# Patient Record
Sex: Female | Born: 1945 | Race: Black or African American | Hispanic: No | Smoking: Former smoker
Health system: Southern US, Community
[De-identification: ages and names within clinical notes are randomized; demographics above are authoritative.]

## PROBLEM LIST (undated history)

## (undated) DIAGNOSIS — M8580 Other specified disorders of bone density and structure, unspecified site: Secondary | ICD-10-CM

## (undated) DIAGNOSIS — R51 Headache: Secondary | ICD-10-CM

## (undated) DIAGNOSIS — R519 Headache, unspecified: Secondary | ICD-10-CM

## (undated) DIAGNOSIS — E785 Hyperlipidemia, unspecified: Secondary | ICD-10-CM

## (undated) DIAGNOSIS — M5090 Cervical disc disorder, unspecified, unspecified cervical region: Secondary | ICD-10-CM

## (undated) DIAGNOSIS — I10 Essential (primary) hypertension: Secondary | ICD-10-CM

## (undated) DIAGNOSIS — N186 End stage renal disease: Secondary | ICD-10-CM

## (undated) DIAGNOSIS — K449 Diaphragmatic hernia without obstruction or gangrene: Secondary | ICD-10-CM

## (undated) DIAGNOSIS — Z992 Dependence on renal dialysis: Secondary | ICD-10-CM

## (undated) DIAGNOSIS — J329 Chronic sinusitis, unspecified: Secondary | ICD-10-CM

## (undated) DIAGNOSIS — F419 Anxiety disorder, unspecified: Secondary | ICD-10-CM

## (undated) DIAGNOSIS — M1250 Traumatic arthropathy, unspecified site: Secondary | ICD-10-CM

## (undated) DIAGNOSIS — N289 Disorder of kidney and ureter, unspecified: Secondary | ICD-10-CM

## (undated) DIAGNOSIS — N179 Acute kidney failure, unspecified: Secondary | ICD-10-CM

## (undated) DIAGNOSIS — J189 Pneumonia, unspecified organism: Secondary | ICD-10-CM

## (undated) DIAGNOSIS — K219 Gastro-esophageal reflux disease without esophagitis: Secondary | ICD-10-CM

## (undated) DIAGNOSIS — K222 Esophageal obstruction: Secondary | ICD-10-CM

## (undated) DIAGNOSIS — K589 Irritable bowel syndrome without diarrhea: Secondary | ICD-10-CM

## (undated) DIAGNOSIS — J309 Allergic rhinitis, unspecified: Secondary | ICD-10-CM

## (undated) DIAGNOSIS — K5790 Diverticulosis of intestine, part unspecified, without perforation or abscess without bleeding: Secondary | ICD-10-CM

## (undated) DIAGNOSIS — Q396 Congenital diverticulum of esophagus: Secondary | ICD-10-CM

## (undated) DIAGNOSIS — M1990 Unspecified osteoarthritis, unspecified site: Secondary | ICD-10-CM

## (undated) DIAGNOSIS — R918 Other nonspecific abnormal finding of lung field: Secondary | ICD-10-CM

## (undated) DIAGNOSIS — Z9289 Personal history of other medical treatment: Secondary | ICD-10-CM

## (undated) DIAGNOSIS — R079 Chest pain, unspecified: Secondary | ICD-10-CM

## (undated) DIAGNOSIS — J449 Chronic obstructive pulmonary disease, unspecified: Secondary | ICD-10-CM

## (undated) HISTORY — PX: CYSTECTOMY: SUR359

## (undated) HISTORY — PX: BACK SURGERY: SHX140

## (undated) HISTORY — DX: Allergic rhinitis, unspecified: J30.9

## (undated) HISTORY — DX: Unspecified osteoarthritis, unspecified site: M19.90

## (undated) HISTORY — PX: TONSILLECTOMY AND ADENOIDECTOMY: SUR1326

## (undated) HISTORY — DX: Gastro-esophageal reflux disease without esophagitis: K21.9

## (undated) HISTORY — DX: Irritable bowel syndrome, unspecified: K58.9

## (undated) HISTORY — DX: Traumatic arthropathy, unspecified site: M12.50

## (undated) HISTORY — DX: Anxiety disorder, unspecified: F41.9

## (undated) HISTORY — DX: Chronic obstructive pulmonary disease, unspecified: J44.9

## (undated) HISTORY — DX: Diaphragmatic hernia without obstruction or gangrene: K44.9

## (undated) HISTORY — PX: VAGINAL HYSTERECTOMY: SUR661

## (undated) HISTORY — DX: End stage renal disease: N18.6

## (undated) HISTORY — DX: Other specified disorders of bone density and structure, unspecified site: M85.80

## (undated) HISTORY — DX: Esophageal obstruction: K22.2

## (undated) HISTORY — PX: LUNG BIOPSY: SHX232

## (undated) HISTORY — DX: Congenital diverticulum of esophagus: Q39.6

## (undated) HISTORY — DX: Essential (primary) hypertension: I10

## (undated) HISTORY — DX: Diverticulosis of intestine, part unspecified, without perforation or abscess without bleeding: K57.90

---

## 1998-01-05 ENCOUNTER — Ambulatory Visit (HOSPITAL_COMMUNITY): Admission: RE | Admit: 1998-01-05 | Discharge: 1998-01-05 | Payer: Self-pay | Admitting: Gynecology

## 1998-01-07 ENCOUNTER — Ambulatory Visit (HOSPITAL_COMMUNITY): Admission: RE | Admit: 1998-01-07 | Discharge: 1998-01-07 | Payer: Self-pay | Admitting: Gynecology

## 1998-07-14 ENCOUNTER — Ambulatory Visit (HOSPITAL_COMMUNITY): Admission: RE | Admit: 1998-07-14 | Discharge: 1998-07-14 | Payer: Self-pay | Admitting: Internal Medicine

## 1998-07-14 ENCOUNTER — Encounter: Payer: Self-pay | Admitting: Internal Medicine

## 1999-02-22 ENCOUNTER — Encounter: Payer: Self-pay | Admitting: Gynecology

## 1999-02-22 ENCOUNTER — Ambulatory Visit (HOSPITAL_COMMUNITY): Admission: RE | Admit: 1999-02-22 | Discharge: 1999-02-22 | Payer: Self-pay | Admitting: Gynecology

## 1999-05-10 HISTORY — PX: COLONOSCOPY: SHX174

## 2000-02-15 ENCOUNTER — Encounter: Payer: Self-pay | Admitting: Internal Medicine

## 2000-02-15 ENCOUNTER — Ambulatory Visit (HOSPITAL_COMMUNITY): Admission: RE | Admit: 2000-02-15 | Discharge: 2000-02-15 | Payer: Self-pay | Admitting: Internal Medicine

## 2001-04-16 ENCOUNTER — Other Ambulatory Visit: Admission: RE | Admit: 2001-04-16 | Discharge: 2001-04-16 | Payer: Self-pay | Admitting: Obstetrics and Gynecology

## 2001-07-20 ENCOUNTER — Ambulatory Visit (HOSPITAL_COMMUNITY): Admission: RE | Admit: 2001-07-20 | Discharge: 2001-07-20 | Payer: Self-pay | Admitting: Otolaryngology

## 2001-07-20 ENCOUNTER — Encounter: Payer: Self-pay | Admitting: Otolaryngology

## 2002-10-26 ENCOUNTER — Encounter: Payer: Self-pay | Admitting: Emergency Medicine

## 2002-10-27 ENCOUNTER — Inpatient Hospital Stay (HOSPITAL_COMMUNITY): Admission: EM | Admit: 2002-10-27 | Discharge: 2002-10-29 | Payer: Self-pay | Admitting: Emergency Medicine

## 2003-05-10 HISTORY — PX: ANTERIOR CERVICAL DECOMP/DISCECTOMY FUSION: SHX1161

## 2003-05-26 ENCOUNTER — Inpatient Hospital Stay (HOSPITAL_COMMUNITY): Admission: RE | Admit: 2003-05-26 | Discharge: 2003-05-28 | Payer: Self-pay | Admitting: Specialist

## 2003-12-17 ENCOUNTER — Other Ambulatory Visit: Admission: RE | Admit: 2003-12-17 | Discharge: 2003-12-17 | Payer: Self-pay | Admitting: Gynecology

## 2004-07-15 ENCOUNTER — Ambulatory Visit: Payer: Self-pay | Admitting: Internal Medicine

## 2004-08-02 ENCOUNTER — Ambulatory Visit: Payer: Self-pay | Admitting: Internal Medicine

## 2005-06-01 ENCOUNTER — Ambulatory Visit: Payer: Self-pay | Admitting: Internal Medicine

## 2005-07-12 ENCOUNTER — Ambulatory Visit: Payer: Self-pay | Admitting: Internal Medicine

## 2005-07-14 ENCOUNTER — Ambulatory Visit (HOSPITAL_COMMUNITY): Admission: RE | Admit: 2005-07-14 | Discharge: 2005-07-14 | Payer: Self-pay | Admitting: Sports Medicine

## 2006-02-13 ENCOUNTER — Ambulatory Visit: Payer: Self-pay | Admitting: Internal Medicine

## 2006-10-19 ENCOUNTER — Ambulatory Visit: Payer: Self-pay | Admitting: Internal Medicine

## 2006-10-24 LAB — CONVERTED CEMR LAB
BUN: 19 mg/dL (ref 6–23)
Basophils Absolute: 0 10*3/uL (ref 0.0–0.1)
Basophils Relative: 0.8 % (ref 0.0–1.0)
CO2: 31 meq/L (ref 19–32)
Calcium: 8.9 mg/dL (ref 8.4–10.5)
Chloride: 106 meq/L (ref 96–112)
Creatinine, Ser: 1 mg/dL (ref 0.4–1.2)
Hemoglobin: 14.1 g/dL (ref 12.0–15.0)
Monocytes Absolute: 0.5 10*3/uL (ref 0.2–0.7)
Monocytes Relative: 9.3 % (ref 3.0–11.0)
Potassium: 3.5 meq/L (ref 3.5–5.1)
RBC: 4.39 M/uL (ref 3.87–5.11)
RDW: 12 % (ref 11.5–14.6)

## 2006-11-02 ENCOUNTER — Ambulatory Visit: Payer: Self-pay | Admitting: Internal Medicine

## 2006-11-06 ENCOUNTER — Ambulatory Visit: Payer: Self-pay

## 2006-11-06 ENCOUNTER — Encounter: Payer: Self-pay | Admitting: Internal Medicine

## 2006-11-07 ENCOUNTER — Ambulatory Visit: Payer: Self-pay | Admitting: Internal Medicine

## 2007-07-30 ENCOUNTER — Telehealth: Payer: Self-pay | Admitting: Internal Medicine

## 2007-12-25 ENCOUNTER — Ambulatory Visit: Payer: Self-pay | Admitting: Internal Medicine

## 2007-12-25 DIAGNOSIS — I10 Essential (primary) hypertension: Secondary | ICD-10-CM | POA: Insufficient documentation

## 2008-01-08 LAB — CONVERTED CEMR LAB: Pap Smear: NORMAL

## 2008-01-31 ENCOUNTER — Emergency Department (HOSPITAL_COMMUNITY): Admission: EM | Admit: 2008-01-31 | Discharge: 2008-01-31 | Payer: Self-pay | Admitting: Emergency Medicine

## 2008-02-13 ENCOUNTER — Emergency Department (HOSPITAL_COMMUNITY): Admission: EM | Admit: 2008-02-13 | Discharge: 2008-02-14 | Payer: Self-pay | Admitting: Emergency Medicine

## 2008-03-26 ENCOUNTER — Ambulatory Visit (HOSPITAL_COMMUNITY): Admission: RE | Admit: 2008-03-26 | Discharge: 2008-03-26 | Payer: Self-pay | Admitting: Gynecology

## 2008-04-16 ENCOUNTER — Emergency Department (HOSPITAL_COMMUNITY): Admission: EM | Admit: 2008-04-16 | Discharge: 2008-04-16 | Payer: Self-pay | Admitting: Emergency Medicine

## 2008-04-25 ENCOUNTER — Telehealth (INDEPENDENT_AMBULATORY_CARE_PROVIDER_SITE_OTHER): Payer: Self-pay | Admitting: *Deleted

## 2008-08-21 ENCOUNTER — Encounter: Payer: Self-pay | Admitting: Internal Medicine

## 2008-08-21 ENCOUNTER — Ambulatory Visit: Payer: Self-pay | Admitting: Internal Medicine

## 2008-08-21 DIAGNOSIS — R930 Abnormal findings on diagnostic imaging of skull and head, not elsewhere classified: Secondary | ICD-10-CM | POA: Insufficient documentation

## 2008-08-21 DIAGNOSIS — R198 Other specified symptoms and signs involving the digestive system and abdomen: Secondary | ICD-10-CM | POA: Insufficient documentation

## 2008-08-22 ENCOUNTER — Ambulatory Visit: Payer: Self-pay | Admitting: Internal Medicine

## 2008-08-22 LAB — CONVERTED CEMR LAB
ALT: 11 units/L (ref 0–35)
AST: 14 units/L (ref 0–37)
BUN: 8 mg/dL (ref 6–23)
Basophils Absolute: 0 10*3/uL (ref 0.0–0.1)
Bilirubin Urine: NEGATIVE
Bilirubin, Direct: 0.1 mg/dL (ref 0.0–0.3)
Cholesterol: 191 mg/dL (ref 0–200)
Creatinine, Ser: 0.7 mg/dL (ref 0.4–1.2)
Creatinine,U: 466.2 mg/dL
Eosinophils Relative: 3.2 % (ref 0.0–5.0)
Folate: 3.2 ng/mL
GFR calc non Af Amer: 108.02 mL/min (ref >60)
Glucose, Bld: 103 mg/dL — ABNORMAL HIGH (ref 70–99)
H Pylori IgG: NEGATIVE
HCT: 34.9 % — ABNORMAL LOW (ref 36.0–46.0)
Hemoglobin, Urine: NEGATIVE
Hgb A1c MFr Bld: 6.1 % (ref 4.6–6.5)
Iron: 32 ug/dL — ABNORMAL LOW (ref 42–145)
LDL Cholesterol: 112 mg/dL — ABNORMAL HIGH (ref 0–99)
Lymphs Abs: 1 10*3/uL (ref 0.7–4.0)
Microalb Creat Ratio: 5.8 mg/g (ref 0.0–30.0)
Microalb, Ur: 2.7 mg/dL — ABNORMAL HIGH (ref 0.0–1.9)
Monocytes Absolute: 0.4 10*3/uL (ref 0.1–1.0)
Monocytes Relative: 5.9 % (ref 3.0–12.0)
Neutrophils Relative %: 76.4 % (ref 43.0–77.0)
Platelets: 341 10*3/uL (ref 150.0–400.0)
RDW: 11.8 % (ref 11.5–14.6)
Saturation Ratios: 11.6 % — ABNORMAL LOW (ref 20.0–50.0)
Total Bilirubin: 0.5 mg/dL (ref 0.3–1.2)
Total CHOL/HDL Ratio: 3
Total Protein, Urine: 30 mg/dL
Transferrin: 196.2 mg/dL — ABNORMAL LOW (ref 212.0–360.0)
Triglycerides: 107 mg/dL (ref 0.0–149.0)
Urine Glucose: NEGATIVE mg/dL
Urobilinogen, UA: 1 (ref 0.0–1.0)
VLDL: 21.4 mg/dL (ref 0.0–40.0)
Vit D, 25-Hydroxy: 7 ng/mL — ABNORMAL LOW (ref 30–89)
Vitamin B-12: 318 pg/mL (ref 211–911)
WBC: 6.6 10*3/uL (ref 4.5–10.5)

## 2008-08-25 ENCOUNTER — Ambulatory Visit: Payer: Self-pay | Admitting: Internal Medicine

## 2008-08-26 ENCOUNTER — Ambulatory Visit: Payer: Self-pay | Admitting: Internal Medicine

## 2008-08-26 ENCOUNTER — Telehealth (INDEPENDENT_AMBULATORY_CARE_PROVIDER_SITE_OTHER): Payer: Self-pay | Admitting: *Deleted

## 2008-08-27 ENCOUNTER — Encounter: Payer: Self-pay | Admitting: Internal Medicine

## 2008-08-29 ENCOUNTER — Ambulatory Visit: Payer: Self-pay | Admitting: Internal Medicine

## 2008-08-29 DIAGNOSIS — R059 Cough, unspecified: Secondary | ICD-10-CM | POA: Insufficient documentation

## 2008-08-29 DIAGNOSIS — R05 Cough: Secondary | ICD-10-CM

## 2008-09-03 ENCOUNTER — Ambulatory Visit (HOSPITAL_COMMUNITY): Admission: RE | Admit: 2008-09-03 | Discharge: 2008-09-03 | Payer: Self-pay | Admitting: Internal Medicine

## 2008-09-04 ENCOUNTER — Ambulatory Visit: Payer: Self-pay | Admitting: Internal Medicine

## 2008-09-04 ENCOUNTER — Encounter: Payer: Self-pay | Admitting: Internal Medicine

## 2008-09-04 ENCOUNTER — Ambulatory Visit: Admission: RE | Admit: 2008-09-04 | Discharge: 2008-09-04 | Payer: Self-pay | Admitting: Internal Medicine

## 2008-09-04 ENCOUNTER — Telehealth: Payer: Self-pay | Admitting: Internal Medicine

## 2008-09-05 ENCOUNTER — Ambulatory Visit: Payer: Self-pay | Admitting: Pulmonary Disease

## 2008-09-11 ENCOUNTER — Ambulatory Visit: Payer: Self-pay | Admitting: Internal Medicine

## 2008-09-12 ENCOUNTER — Ambulatory Visit (HOSPITAL_COMMUNITY): Admission: RE | Admit: 2008-09-12 | Discharge: 2008-09-12 | Payer: Self-pay | Admitting: Internal Medicine

## 2008-09-15 ENCOUNTER — Telehealth: Payer: Self-pay | Admitting: Internal Medicine

## 2008-09-16 ENCOUNTER — Telehealth: Payer: Self-pay | Admitting: Internal Medicine

## 2008-10-08 ENCOUNTER — Ambulatory Visit (HOSPITAL_COMMUNITY): Admission: RE | Admit: 2008-10-08 | Discharge: 2008-10-08 | Payer: Self-pay | Admitting: Internal Medicine

## 2008-10-08 ENCOUNTER — Encounter (INDEPENDENT_AMBULATORY_CARE_PROVIDER_SITE_OTHER): Payer: Self-pay | Admitting: Interventional Radiology

## 2008-10-09 ENCOUNTER — Telehealth: Payer: Self-pay | Admitting: Internal Medicine

## 2008-10-10 ENCOUNTER — Telehealth (INDEPENDENT_AMBULATORY_CARE_PROVIDER_SITE_OTHER): Payer: Self-pay | Admitting: *Deleted

## 2008-10-14 ENCOUNTER — Ambulatory Visit: Payer: Self-pay | Admitting: Internal Medicine

## 2008-10-16 ENCOUNTER — Telehealth: Payer: Self-pay | Admitting: Internal Medicine

## 2008-10-20 ENCOUNTER — Telehealth: Payer: Self-pay | Admitting: Internal Medicine

## 2008-10-29 ENCOUNTER — Ambulatory Visit: Payer: Self-pay | Admitting: Thoracic Surgery

## 2008-11-06 ENCOUNTER — Ambulatory Visit: Payer: Self-pay | Admitting: Thoracic Surgery

## 2008-11-06 ENCOUNTER — Encounter: Payer: Self-pay | Admitting: Internal Medicine

## 2008-11-06 ENCOUNTER — Encounter: Payer: Self-pay | Admitting: Thoracic Surgery

## 2008-11-06 ENCOUNTER — Inpatient Hospital Stay (HOSPITAL_COMMUNITY): Admission: RE | Admit: 2008-11-06 | Discharge: 2008-11-11 | Payer: Self-pay | Admitting: Thoracic Surgery

## 2008-11-19 ENCOUNTER — Encounter: Admission: RE | Admit: 2008-11-19 | Discharge: 2008-11-19 | Payer: Self-pay | Admitting: Thoracic Surgery

## 2008-11-19 ENCOUNTER — Ambulatory Visit: Payer: Self-pay | Admitting: Thoracic Surgery

## 2008-11-25 ENCOUNTER — Ambulatory Visit: Payer: Self-pay | Admitting: Internal Medicine

## 2008-11-25 LAB — CONVERTED CEMR LAB
Anti Nuclear Antibody(ANA): NEGATIVE
CRP, High Sensitivity: 17 — ABNORMAL HIGH (ref 0.00–5.00)
Cyclic Citrullin Peptide Ab: 0 units (ref <7)
Scleroderma (Scl-70) (ENA) Antibody, IgG: <0.2 (ref <1.0)
Sed Rate: 84 mm/hr — ABNORMAL HIGH (ref 0–22)
ds DNA Ab: <1 (ref <5)

## 2008-12-02 ENCOUNTER — Ambulatory Visit: Payer: Self-pay | Admitting: Thoracic Surgery

## 2008-12-09 ENCOUNTER — Telehealth (INDEPENDENT_AMBULATORY_CARE_PROVIDER_SITE_OTHER): Payer: Self-pay | Admitting: *Deleted

## 2008-12-10 ENCOUNTER — Encounter: Admission: RE | Admit: 2008-12-10 | Discharge: 2008-12-10 | Payer: Self-pay | Admitting: Thoracic Surgery

## 2008-12-10 ENCOUNTER — Ambulatory Visit: Payer: Self-pay | Admitting: Thoracic Surgery

## 2008-12-18 ENCOUNTER — Telehealth: Payer: Self-pay | Admitting: Internal Medicine

## 2008-12-31 ENCOUNTER — Telehealth: Payer: Self-pay | Admitting: Internal Medicine

## 2009-01-02 ENCOUNTER — Ambulatory Visit: Payer: Self-pay | Admitting: Internal Medicine

## 2009-01-02 ENCOUNTER — Encounter (INDEPENDENT_AMBULATORY_CARE_PROVIDER_SITE_OTHER): Payer: Self-pay | Admitting: *Deleted

## 2009-01-08 ENCOUNTER — Telehealth: Payer: Self-pay | Admitting: Internal Medicine

## 2009-01-21 ENCOUNTER — Encounter: Admission: RE | Admit: 2009-01-21 | Discharge: 2009-01-21 | Payer: Self-pay | Admitting: Thoracic Surgery

## 2009-01-21 ENCOUNTER — Ambulatory Visit: Payer: Self-pay | Admitting: Thoracic Surgery

## 2009-01-21 ENCOUNTER — Encounter: Payer: Self-pay | Admitting: Internal Medicine

## 2009-01-22 ENCOUNTER — Ambulatory Visit: Payer: Self-pay | Admitting: Internal Medicine

## 2009-01-27 ENCOUNTER — Encounter: Payer: Self-pay | Admitting: Internal Medicine

## 2009-01-27 ENCOUNTER — Ambulatory Visit: Payer: Self-pay | Admitting: Cardiology

## 2009-02-02 ENCOUNTER — Telehealth: Payer: Self-pay | Admitting: Internal Medicine

## 2009-02-04 ENCOUNTER — Encounter: Payer: Self-pay | Admitting: Internal Medicine

## 2009-02-25 ENCOUNTER — Ambulatory Visit: Payer: Self-pay | Admitting: Internal Medicine

## 2009-03-09 ENCOUNTER — Ambulatory Visit: Payer: Self-pay | Admitting: Cardiovascular Disease

## 2009-03-09 ENCOUNTER — Inpatient Hospital Stay (HOSPITAL_COMMUNITY): Admission: EM | Admit: 2009-03-09 | Discharge: 2009-03-11 | Payer: Self-pay | Admitting: Emergency Medicine

## 2009-03-11 ENCOUNTER — Encounter (INDEPENDENT_AMBULATORY_CARE_PROVIDER_SITE_OTHER): Payer: Self-pay | Admitting: Internal Medicine

## 2009-03-11 ENCOUNTER — Telehealth: Payer: Self-pay | Admitting: Internal Medicine

## 2009-03-17 ENCOUNTER — Ambulatory Visit: Payer: Self-pay | Admitting: Internal Medicine

## 2009-03-17 DIAGNOSIS — R079 Chest pain, unspecified: Secondary | ICD-10-CM | POA: Insufficient documentation

## 2009-03-18 ENCOUNTER — Encounter: Payer: Self-pay | Admitting: Internal Medicine

## 2009-03-24 ENCOUNTER — Telehealth (INDEPENDENT_AMBULATORY_CARE_PROVIDER_SITE_OTHER): Payer: Self-pay | Admitting: *Deleted

## 2009-03-25 ENCOUNTER — Ambulatory Visit: Payer: Self-pay | Admitting: Cardiology

## 2009-03-25 ENCOUNTER — Ambulatory Visit: Payer: Self-pay

## 2009-03-25 ENCOUNTER — Encounter (HOSPITAL_COMMUNITY): Admission: RE | Admit: 2009-03-25 | Discharge: 2009-05-07 | Payer: Self-pay | Admitting: Internal Medicine

## 2009-03-26 ENCOUNTER — Telehealth (INDEPENDENT_AMBULATORY_CARE_PROVIDER_SITE_OTHER): Payer: Self-pay | Admitting: *Deleted

## 2009-03-26 ENCOUNTER — Encounter: Payer: Self-pay | Admitting: Internal Medicine

## 2009-04-15 ENCOUNTER — Ambulatory Visit: Payer: Self-pay | Admitting: Cardiology

## 2009-04-21 ENCOUNTER — Ambulatory Visit (HOSPITAL_COMMUNITY): Admission: RE | Admit: 2009-04-21 | Discharge: 2009-04-21 | Payer: Self-pay | Admitting: Cardiology

## 2009-04-21 ENCOUNTER — Ambulatory Visit: Payer: Self-pay | Admitting: Cardiology

## 2009-05-13 ENCOUNTER — Ambulatory Visit: Payer: Self-pay | Admitting: Cardiology

## 2009-05-27 ENCOUNTER — Ambulatory Visit: Payer: Self-pay | Admitting: Thoracic Surgery

## 2009-05-27 ENCOUNTER — Encounter: Admission: RE | Admit: 2009-05-27 | Discharge: 2009-05-27 | Payer: Self-pay | Admitting: Thoracic Surgery

## 2009-05-29 ENCOUNTER — Ambulatory Visit: Payer: Self-pay | Admitting: Cardiology

## 2009-06-01 ENCOUNTER — Ambulatory Visit: Payer: Self-pay | Admitting: Internal Medicine

## 2009-06-17 ENCOUNTER — Telehealth: Payer: Self-pay | Admitting: Internal Medicine

## 2009-06-23 ENCOUNTER — Encounter: Payer: Self-pay | Admitting: Internal Medicine

## 2009-07-06 ENCOUNTER — Ambulatory Visit: Payer: Self-pay | Admitting: Internal Medicine

## 2009-08-21 ENCOUNTER — Telehealth: Payer: Self-pay | Admitting: Internal Medicine

## 2009-09-10 ENCOUNTER — Encounter: Payer: Self-pay | Admitting: Internal Medicine

## 2009-11-30 ENCOUNTER — Ambulatory Visit: Payer: Self-pay | Admitting: Internal Medicine

## 2009-12-04 ENCOUNTER — Ambulatory Visit: Payer: Self-pay | Admitting: Cardiology

## 2010-02-02 ENCOUNTER — Ambulatory Visit: Payer: Self-pay | Admitting: Internal Medicine

## 2010-02-03 ENCOUNTER — Telehealth: Payer: Self-pay | Admitting: Internal Medicine

## 2010-02-03 LAB — CONVERTED CEMR LAB
BUN: 20 mg/dL (ref 6–23)
Bilirubin Urine: NEGATIVE
Bilirubin, Direct: 0.1 mg/dL (ref 0.0–0.3)
Calcium: 9.2 mg/dL (ref 8.4–10.5)
Chloride: 101 meq/L (ref 96–112)
Creatinine, Ser: 0.8 mg/dL (ref 0.4–1.2)
Eosinophils Absolute: 0.2 10*3/uL (ref 0.0–0.7)
Eosinophils Relative: 6.6 % — ABNORMAL HIGH (ref 0.0–5.0)
HDL: 121.2 mg/dL (ref >39.00)
Hemoglobin, Urine: NEGATIVE
Ketones, ur: NEGATIVE mg/dL
Leukocytes, UA: NEGATIVE
Lymphocytes Relative: 32.4 % (ref 12.0–46.0)
MCHC: 34.8 g/dL (ref 30.0–36.0)
MCV: 96.1 fL (ref 78.0–100.0)
Monocytes Absolute: 0.3 10*3/uL (ref 0.1–1.0)
Neutrophils Relative %: 49.7 % (ref 43.0–77.0)
Platelets: 288 10*3/uL (ref 150.0–400.0)
TSH: 1.12 microintl units/mL (ref 0.35–5.50)
Total Bilirubin: 0.6 mg/dL (ref 0.3–1.2)
Total CHOL/HDL Ratio: 2
Triglycerides: 62 mg/dL (ref 0.0–149.0)
Urobilinogen, UA: 0.2 (ref 0.0–1.0)
WBC: 3.8 10*3/uL — ABNORMAL LOW (ref 4.5–10.5)

## 2010-03-02 ENCOUNTER — Ambulatory Visit: Payer: Self-pay | Admitting: Internal Medicine

## 2010-03-02 LAB — CONVERTED CEMR LAB
AST: 19 units/L (ref 0–37)
Albumin: 3.6 g/dL (ref 3.5–5.2)
Cholesterol: 239 mg/dL — ABNORMAL HIGH (ref 0–200)
Direct LDL: 103.8 mg/dL
Total CHOL/HDL Ratio: 2
Triglycerides: 63 mg/dL (ref 0.0–149.0)
VLDL: 12.6 mg/dL (ref 0.0–40.0)

## 2010-04-09 ENCOUNTER — Ambulatory Visit: Payer: Self-pay | Admitting: Internal Medicine

## 2010-04-09 DIAGNOSIS — J019 Acute sinusitis, unspecified: Secondary | ICD-10-CM | POA: Insufficient documentation

## 2010-05-29 ENCOUNTER — Other Ambulatory Visit: Payer: Self-pay | Admitting: Internal Medicine

## 2010-05-29 DIAGNOSIS — R918 Other nonspecific abnormal finding of lung field: Secondary | ICD-10-CM

## 2010-05-31 ENCOUNTER — Encounter: Payer: Self-pay | Admitting: Thoracic Surgery

## 2010-06-06 LAB — CONVERTED CEMR LAB
BUN: 15 mg/dL (ref 6–23)
CO2: 31 meq/L (ref 19–32)
Calcium: 9.1 mg/dL (ref 8.4–10.5)
Creatinine, Ser: 0.7 mg/dL (ref 0.4–1.2)
Glucose, Bld: 78 mg/dL (ref 70–99)
Sodium: 140 meq/L (ref 135–145)

## 2010-06-10 NOTE — Letter (Signed)
Summary: [facility]  [facility]   Imported By: Phillis Knack 10/12/2009 11:49:51  _____________________________________________________________________  External Attachment:    Type:   Image     Comment:   External Document

## 2010-06-10 NOTE — Progress Notes (Signed)
Summary: dumc 2/15  ---- Converted from flag ---- ---- 06/15/2009 12:51 PM, Phillips Grout wrote: Pt has an appt with Dr. Harlene Salts at [facility] on Tues. 06/23/09 at 12:30. Pt is aware. Thanks, Suanne Marker ------------------------------  Appended Document: dumc 2/15 Got lettter from [facility]. SAw Dr. Madalyn Rob there. Her [place] MRN is [id number]. They are asking for actual CD ROM OF all the CT chest and CXRs esp cT dating back to 2005 and actual path slides. They are needing help iwth this. Please check with patient first what the status is on this and then get help from [facility]. Keep me posted  Appended Document: dumc 2/15 [facility] sent CT scans and path slides to attn: tara at Dr. Wolfgang Phoenix office. I have LMTCB with his office to see what exactly is still needed.   Appended Document: dumc 2/15 called and spoke to someone in Dr. Wolfgang Phoenix office and thay stated his assistant was not in the office today but she took a messge and will have her call me back tomorrow.   Appended Document: dumc 2/15 Racheal with Dr. Arvilla Meres office returned [name]'s call.  Per Racheal, she cannot get into their system to see what has been received and what is still needed but she just wanted to return call.  Racheal also stated Kendrick Fries will call Anderson Malta back tomorrow with all of this information.    Appended Document: dumc 2/15 spoke with Kendrick Fries at [facility] and she states they have received all of the ct chest and cxr as well as path slides, they just had nto been processed into the system yet, hence the letter. Kendrick Fries states nothing further needed.

## 2010-06-10 NOTE — Assessment & Plan Note (Signed)
Summary: 3-4 week f/u cardiac ct 04-21-09      Allergies Added:   Referring Provider:  [name], MD Primary Provider:  [name], MD  CC:  3-4 weeks follow up to cardiac ct.  History of Present Illness: 65 yo with history of HTN and hyperlipidemia as well as recent workup for pulmonary nodules who initially presented for cardiology evaluation after an abnormal stress test.  Patient was standing at the cash register in a restaurant in 11/10 when she developed severe mid-back pain.  She took Tylenol w/o relief.  After 20 minutes, the pain spread as a band across her lower chest.  She was nauseated.  Patient went to the ER.  She does not think that NTG helped much but she did get relief with morphine.  She had a negative chest CT for PE or dissection.  Cardiac enzymes were negative x 3 and she was discharged to get a stress test.  The myoview showed a partially reversible anterior defect, due to breast attenuation versus CAD.    She has had no further chest pain since that time.  We did a coronary CT angiogram which showed no coronary disease and calcium score of 0.   Labs (11/10): LDL 77, HDL 85, creatinine 0.8  Current Medications (verified): 1)  Amlodipine Besylate 10 Mg Tabs (Amlodipine Besylate) .[name].. 1po Once Daily 2)  Tramadol Hcl 50 Mg  Tabs (Tramadol Hcl) .... Take 1 Tablet By Mouth Q 6 Hrs As Needed 3)  Adult Aspirin Ec Low Strength 81 Mg  Tbec (Aspirin) .[name].. 1 By Mouth Once Daily 4)  Premarin 0.625 Mg Tabs (Estrogens Conjugated) .[name].. 1 By Mouth Once Daily 5)  Hydrochlorothiazide 12.5 Mg Tabs (Hydrochlorothiazide) .[name].. 1 By Mouth Once Daily 6)  Prilosec Otc 20 Mg Tbec (Omeprazole Magnesium) .... Once Daily 7)  Acetaminophen 325 Mg Tabs (Acetaminophen) .... As Needed  Allergies (verified): 1)  ! * Tetanus 2)  ! Ace Inhibitors  Past History:  Past Medical History: 1. Hypertension 2. GERD with h/o esophageal stricture and esophageal diverticulum 3. Anxiety 4. IBS/chronic  constipation 5. Diverticulosis, colon 6 .Osteopenia 7. c-spine DJD 8. Allergic rhinitis 9. Hyperlipidemia 10. Pulmonary nodules: PET with intermediate uptake.  Transbronchial biopsy was indeterminant.  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  11. Chest pain: Lexiscan myoview (11/10): EF 70% with partially reversible mid-apical anterior perfusion defect. Breast attenuation versus anterior ischemia.  Hard to rule out ischemia as defect was partially reversible.  Coronary CT angiogram was done to followup abnormal myoview.  This showed calcium score 0 and no evident coronary disease.  12. Echo (11/10): EF 60%, mild LAE, PASP 31 mmHg  Family History: Reviewed history from 04/15/2009 and no changes required. father with throat cancer No premature CAD.   Social History: Reviewed history from 08/29/2008 and no changes required. 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 Neck:  Neck supple, no JVD. No masses, thyromegaly or abnormal cervical nodes. Lungs:  Clear bilaterally to auscultation and percussion. Heart:  Non-displaced PMI, chest non-tender; regular rate and rhythm, S1, S2 without murmurs, rubs or gallops. Carotid upstroke normal, no bruit.  Pedals normal pulses. No edema, no varicosities. Abdomen:  Bowel sounds positive; abdomen soft and non-tender without masses, organomegaly, or hernias noted. No hepatosplenomegaly. Extremities:  No clubbing or cyanosis. Neurologic:  Alert and  oriented x 3. Psych:  Normal affect.   Impression & Recommendations:  Problem # 1:  CHEST PAIN (ICD-786.50) I think that the patient's chest pain was probably noncardiac.  The coronary CT was a good quality study showing no coronary disease and calcium score 0, suggesting low overall risk.  I think that the myoview probably had attenuation artifact.  No further cardiac workup necessary at this time.   Problem # 2:  HYPERTENSION (ICD-401.9) BP mildly elevated, will be followed by Dr. Jenny Reichmann.   Patient Instructions: 1)  Your physician recommends that you schedule a follow-up appointment as needed with Dr. Loralie Champagne

## 2010-06-10 NOTE — Assessment & Plan Note (Signed)
Summary: 1 MO ROV /NWS   Vital Signs:  Patient profile:   65 year old female Height:      63 inches Weight:      120 pounds BMI:     21.33 O2 Sat:      97 % on Room air Temp:     97.7 degrees F oral Pulse rate:   63 / minute BP sitting:   110 / 70  (left arm) Cuff size:   regular  Vitals Entered ByShirlean Mylar Ewing (July 06, 2009 10:02 AM)  O2 Flow:  Room air CC: 1 Mo ROV/RE   Primary Care Provider:  [name], MD  CC:  1 Mo ROV/RE.  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  Overall pain relatviely well controlled.  Overall good complaicne with meds, no worsening depressive symtpoms, suicidal ideation or panic.  No other new complaints.  Pt denies polydipsia, polyuria  Overall good compliance with meds, trying to follow low chol, DM diet, wt stable, little excercise however   Problems Prior to Update: 1)  Chest Pain-painful Respiration  (ICD-786.52) 2)  Depression/anxiety  (ICD-300.4) 3)  Myocardial Perfusion Scan, With Stress Test, Abnormal  (ICD-794.39) 4)  Chest Pain  (ICD-786.50) 5)  Allergic Reaction, Acute  (ICD-995.3) 6)  Cough  (ICD-786.2) 7)  Pulmonary Nodule  (ICD-518.89) 8)  Early Satiety  (ICD-780.94) 9)  Abnormal Chest Xray  (ICD-793.1) 10)  Groin Pain  (ICD-789.09) 11)  Early Satiety  (ICD-789.9) 12)  Abdominal Pain, Generalized  (ICD-789.07) 13)  Weight Loss  (ICD-783.21) 14)  Preventive Health Care  (ICD-V70.0) 15)  Hyperlipidemia  (ICD-272.4) 16)  Allergic Rhinitis   (ICD-477.9) 17)  Osteopenia  (ICD-733.90) 18)  Diverticulosis, Colon  (ICD-562.10) 19)  Ibs  (ICD-564.1) 20)  Anxiety  (ICD-300.00) 21)  Diabetes Mellitus, Type II  (ICD-250.00) 22)  Gerd  (ICD-530.81) 23)  Hypertension  (ICD-401.9)  Medications Prior to Update: 1)  Amlodipine Besylate 10 Mg Tabs (Amlodipine Besylate) .[name].. 1po Once Daily 2)  Tramadol Hcl 50 Mg  Tabs (Tramadol Hcl) .... Take 1 Tablet By Mouth Q 6 Hrs As Needed 3)  Adult Aspirin Ec Low Strength 81 Mg  Tbec (Aspirin) .[name].. 1 By Mouth Once Daily 4)  Premarin 0.625 Mg Tabs (Estrogens Conjugated) .[name].. 1 By Mouth Once Daily 5)  Hydrochlorothiazide 12.5 Mg Tabs (Hydrochlorothiazide) .[name].. 1 By Mouth Once Daily 6)  Prilosec Otc 20 Mg Tbec (Omeprazole Magnesium) .... Once Daily 7)  Acetaminophen 325 Mg Tabs (Acetaminophen) .... As Needed 8)  Alprazolam 0.25 Mg Tabs (Alprazolam) .[name].. 1-2 By Mouth Two Times A Day As Needed 9)  Citalopram Hydrobromide 10 Mg Tabs (Citalopram Hydrobromide) .[name].. 1po Once Daily  Current Medications (verified): 1)  Amlodipine Besylate 10 Mg Tabs (Amlodipine Besylate) .[name].. 1po Once Daily 2)  Tramadol Hcl 50 Mg  Tabs (Tramadol Hcl) .... Take 1 Tablet By Mouth Q 6 Hrs As Needed 3)  Adult Aspirin Ec Low Strength 81 Mg  Tbec (Aspirin) .Marland KitchenMarland KitchenMarland Kitchen 1  By Mouth Once Daily 4)  Premarin 0.625 Mg Tabs (Estrogens Conjugated) .[name].. 1 By Mouth Once Daily 5)  Hydrochlorothiazide 12.5 Mg Tabs (Hydrochlorothiazide) .[name].. 1 By Mouth Once Daily 6)  Prilosec Otc 20 Mg Tbec (Omeprazole Magnesium) .... Once Daily 7)  Acetaminophen 325 Mg Tabs (Acetaminophen) .... As Needed 8)  Alprazolam 0.25 Mg Tabs (Alprazolam) .[name].. 1-2 By Mouth Two Times A Day As Needed 9)  Citalopram Hydrobromide 10 Mg Tabs (Citalopram Hydrobromide) .[name].. 1po Once Daily  Allergies (verified): 1)  ! * Tetanus 2)  ! Ace Inhibitors  Past History:  Past Medical History: Last updated: 05/13/2009 1. Hypertension 2. GERD with h/o esophageal stricture and esophageal  diverticulum 3. Anxiety 4. IBS/chronic constipation 5. Diverticulosis, colon 6 .Osteopenia 7. c-spine DJD 8. Allergic rhinitis 9. Hyperlipidemia 10. Pulmonary nodules: PET with intermediate uptake.  Transbronchial biopsy was indeterminant.  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  11. Chest pain: Lexiscan myoview (11/10): EF 70% with partially reversible mid-apical anterior perfusion defect. Breast attenuation versus anterior ischemia.  Hard to rule out ischemia as defect was partially reversible.  Coronary CT angiogram was done to followup abnormal myoview.  This showed calcium score 0 and no evident coronary disease.  12. 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   Head:  normocephalic and atraumatic.   Eyes:  vision grossly intact, pupils equal, and pupils round.   Ears:  R ear normal and L ear normal.   Nose:  no external deformity and no nasal discharge.   Mouth:  no gingival abnormalities and pharynx pink and moist.   Neck:  supple and no masses.   Lungs:  normal respiratory effort and normal breath sounds.   Heart:  normal rate and regular rhythm.   Extremities:  no edema, no erythema  Psych:  not anxious appearing and not depressed appearing.     Impression & Recommendations:  Problem # 1:  CHEST PAIN-PAINFUL RESPIRATION [id number])  Her updated medication list for this problem includes:    Adult Aspirin Ec Low Strength 81 Mg Tbec (Aspirin) .[name]... 1 by mouth once  daily stable overall by hx and exam, ok to continue meds/tx as is , to f/u pulm [facility] as planned  Problem # 2:  DEPRESSION/ANXIETY (ICD-300.4) stable overall by hx and exam, ok to continue meds/tx as is ; Continue all previous medications as before this visit   Problem # 3:  HYPERTENSION (ICD-401.9)  Her updated medication list for this problem includes:    Amlodipine Besylate 10 Mg Tabs (Amlodipine besylate) .[name]... 1po once daily    Hydrochlorothiazide 12.5 Mg Tabs (Hydrochlorothiazide) .[name]... 1 by mouth once daily  BP today: 110/70 Prior BP: 120/78 (06/01/2009)  Labs Reviewed: K+: 3.8 (04/15/2009) Creat: : 0.7 (04/15/2009)   Chol: 191 (08/21/2008)   HDL: 57.50 (08/21/2008)   LDL: 112 (08/21/2008)   TG: 107.0 (08/21/2008) stable overall by hx and exam, ok to continue meds/tx as is   Problem # 4:  DIABETES MELLITUS, TYPE II (ICD-250.00)  Her updated medication list  for this problem includes:    Adult Aspirin Ec Low Strength 81 Mg Tbec (Aspirin) .[name]... 1 by mouth once daily  Labs Reviewed: Creat: 0.7 (04/15/2009)    Reviewed HgBA1c results: 6.1 (08/21/2008) stable overall by hx and exam, ok to continue meds/tx as is - does not need OHA at this time.  Complete Medication List: 1)  Amlodipine Besylate 10 Mg Tabs (Amlodipine besylate) .[name].. 1po once daily 2)  Tramadol Hcl 50 Mg Tabs (Tramadol hcl) .... Take 1 tablet by mouth q 6 hrs as needed 3)  Adult Aspirin Ec Low Strength 81 Mg Tbec (Aspirin) .[name].. 1 by mouth once daily 4)  Premarin 0.625 Mg Tabs (Estrogens conjugated) .[name].. 1 by mouth once daily 5)  Hydrochlorothiazide 12.5 Mg Tabs (Hydrochlorothiazide) .[name].. 1 by mouth once daily 6)  Prilosec Otc 20 Mg Tbec (Omeprazole magnesium) .... Once daily 7)  Acetaminophen 325 Mg Tabs (Acetaminophen) .... As needed 8)  Alprazolam 0.25 Mg Tabs (Alprazolam) .[name].. 1-2 by mouth two times a day as needed 9)  Citalopram Hydrobromide 10 Mg Tabs (Citalopram hydrobromide) .[name].. 1po once daily  Patient  Instructions: 1)  Continue all previous medications as before this visit  2)  Please schedule a follow-up appointment in 6 months with CPX labs

## 2010-06-10 NOTE — Assessment & Plan Note (Signed)
Summary: [name]   Visit Type:  Follow-up Copy to:  [name] Cower, MD Primary Provider/Referring Provider:  [name], MD, Dr. Arlyce [name] CVTS  CC:  Pt here fo rfollow-up to review CT results. Pt lip biopsy was negative. pt was hospitalized with angina in 03/2009. P thas followed up with Dr.Mclean. Pt has never had PNA vacc. [name]  History of Present Illness: OV 06/01/2009. 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 Since last visit on 02/25/2009 [name] continues without respiratory complaints. Weight continues to be stable. She did hae one admission 03/09/2009 for atupical chest pain and has been ruled out for both CAD and PE with investigations. She underwent salivary gland bx on 03/28/2009 and this showed chronic sialadentis but was negative for Sjogren.   Her main concern today is chest pain. This sstarted post-lung biopsy in July 2010. Location is a band like area at the level of thoractomy incisiin but extending anterior and posterior to the left  infra-axillary  incisin site. Constant. slowly progressive. At its best pain is a 8 of 10.  Made worse by deep inspiration. Made better by strecthing her arms cranially. Also present on right side. Denies associated shingles or rash but admits to insomnia and anxiety.  Dr [name] has reportedly reassured her but she is very concerned about this pain.   Current Medications (verified): 1)  Amlodipine Besylate 10 Mg Tabs (Amlodipine Besylate) .[name].. 1po Once Daily 2)  Tramadol Hcl 50 Mg  Tabs (Tramadol Hcl) .... Take 1 Tablet By Mouth Q 6 Hrs As Needed 3)  Adult Aspirin Ec Low Strength 81 Mg  Tbec (Aspirin) .[name].. 1 By Mouth Once Daily 4)  Premarin 0.625 Mg Tabs (Estrogens Conjugated) .[name].. 1 By Mouth Once Daily 5)  Hydrochlorothiazide 12.5 Mg Tabs (Hydrochlorothiazide) .[name].. 1 By Mouth Once Daily 6)  Prilosec Otc 20 Mg  Tbec (Omeprazole Magnesium) .... Once Daily 7)  Acetaminophen 325 Mg Tabs (Acetaminophen) .... As Needed  Allergies (verified): 1)  ! * Tetanus 2)  ! Ace Inhibitors  Past History:  Past Surgical History: Last updated: 12/25/2007 s/p c-spine surgury Hysterectomy Oophorectomy - bilateral Tonsillectomy  Family History: Last updated: 04/15/2009 father with throat cancer No premature CAD.   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 1. Hypertension 2. GERD with h/o esophageal stricture and esophageal diverticulum 3. Anxiety 4. IBS/chronic constipation 5. Diverticulosis, colon 6 .Osteopenia 7. c-spine DJD 8. Allergic rhinitis 9. Hyperlipidemia 10. Pulmonary nodules: PET with intermediate uptake.  Transbronchial biopsy was indeterminant.  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  11. Chest pain: Lexiscan myoview (11/10): EF 70% with partially reversible mid-apical anterior perfusion defect. Breast attenuation versus anterior ischemia.  Hard to rule out ischemia as defect was partially reversible.  Coronary CT angiogram was done to followup abnormal myoview.  This showed calcium score 0 and no evident coronary disease.  12. Echo (11/10): EF 60%, mild LAE, PASP 31 mmHg  Family History: Reviewed history from 04/15/2009 and no changes required. father with throat cancer No premature CAD.   Social History: Reviewed history from 08/29/2008 and no changes required. Divorced 2 children Former Smoker - quit 1990s, smoked x  15 years, <1ppd Alcohol use-yes nurse technician - retired  Review of Systems       The patient complains of chest pain.  The patient denies shortness of  breath with activity, shortness of breath at rest, productive cough, non-productive cough, coughing up blood, irregular heartbeats, acid heartburn, indigestion, loss of appetite, weight change, abdominal pain, difficulty swallowing, sore throat, tooth/dental problems, headaches, nasal congestion/difficulty breathing through nose, sneezing, itching, ear ache, anxiety, depression, hand/feet swelling, joint stiffness or pain, rash, change in color of mucus, and fever.    Vital Signs:  Patient profile:   65 year old female Height:      63 inches Weight:      122.25 pounds O2 Sat:      100 % on Room air Temp:     97.6 degrees F oral Pulse rate:   65 / minute BP sitting:   126 / 78  (right arm) Cuff size:   regular  Vitals Entered By: Codington Bing CMA (June 01, 2009 11:16 AM)  O2 Flow:  Room air CC: Pt here fo rfollow-up to review CT results. Pt lip biopsy was negative. pt was hospitalized with angina in 03/2009. P thas followed up with Dr.Mclean. Pt has never had PNA vacc.  Comments Medications reviewed with patient [name] CMA  June 01, 2009 11:21 AM    Physical Exam  General:  wd female in nad Head:  normocephalic and atraumatic Eyes:  PERRLA/EOM intact; conjunctiva and sclera clear Ears:  TMs intact and clear with normal canals Nose:  no deformity, discharge, inflammation, or lesions Mouth:  no deformity or lesions Neck:  no swelling noted Chest Wall:  no deformities noted tender to touch on left costochondral junction Lungs:  no stridor or other respiratory noise chest totally clear to auscultation Heart:  rrr, no mrg Abdomen:  bowel sounds positive; abdomen soft and non-tender without masses, or organomegaly Msk:  no deformity or scoliosis noted with normal posture Pulses:  pulses normal Extremities:  no clubbing, cyanosis, edema, or deformity noted Neurologic:  CN II-XII grossly intact with normal reflexes, coordination, muscle strength and tone Skin:   intact without lesions or rashes Cervical Nodes:  no significant adenopathy Axillary Nodes:  no significant adenopathy Psych:  alert and cooperative; normal mood and affect; normal attention span and concentration   CT of Chest  Procedure date:  05/29/2009  Findings:      chronic changes - no change from prior CTs. Personally reviewed  Impression & Recommendations:  Problem # 1:  CHEST PAIN-PAINFUL RESPIRATION (ICD-786.52) Assessment New  I am not sure what is causing this pain. Sounds atypoical for post thoracotmy pain. She is tender on ribs. I have discussed with Dr. Arlyce [name] who also is not sure what is causing the pain. He is happy to see her again. I will call her and let her know that. She might opt to go to [facility]   plan send back to Dr. Arlyce [name] Her updated medication list for this problem includes:    Adult Aspirin Ec Low Strength 81 Mg Tbec (Aspirin) .[name]... 1 by mouth once daily  Orders: Est. Patient Level IV [id number])  Problem # 2:  PULMONARY NODULE (ICD-518.89) Assessment: Unchanged  She has had spontaenous improvement in her lung dissease. This is based on weight gain and improvement in nodule size on 01/27/2009 Ct. However, nodules have remained stable in size Sept 2010 -> Jan 2011. Still very reasuring that we are not dealing with malignant process. extensive wu  for Sjogren's is now negative. I have reassured [place] and suggested best way forward is expectant approatch. She is open to getting another opinion at [facility] given uncertainty in diagnosis and prognosis and Rx options. Therefore, I will refer her to either Dr. Corrin Parker or Dr. Madalyn Rob. .   Orders: Est. Patient Level IV [id number])  Patient Instructions: 1)  I will discuss with Dr [name] about your pain 2)  Please talk to Dr. Jenny Reichmann about nerves and insomnia 3)  Please call us back if you dont hear from [place] in 1 week 4)  Return to see me in 6 months 5)  ............... 6)  update 7)  patient to see Dr. Arlyce [name] for pain  8)   refer to [facility] for lung disease 9)  ROV 6 months    Appended Document: Orders Update     Clinical Lists Changes  Orders: Added new Referral order of Misc. Referral (Misc. Ref) - Signed

## 2010-06-10 NOTE — Assessment & Plan Note (Signed)
Summary: R LOW BACK PAIN-L BACK CHEST PAIN -STC   Vital Signs:  Patient profile:   65 year old female Height:      63 inches Weight:      118 pounds BMI:     20.98 O2 Sat:      96 % on Room air Temp:     98.5 degrees F oral Pulse rate:   56 / minute BP sitting:   112 / 64  (left arm) Cuff size:   regular  Vitals Entered By: Shirlean Mylar Ewing CMA Deborra Medina) (November 30, 2009 4:11 PM)  O2 Flow:  Room air CC: Right low back pain, left upper back pain/RE   Primary Care Provider:  [name], MD  CC:  Right low back pain and left upper back pain/RE.  History of Present Illness: here to f/u - c/o  right lower back pain x 6 wks, radiates with shooting pains to the right buttock area but not lower;  no RLE pain/numb/weak;; has had some constipation but no other bowel changes, no bladder changes,  appetite ok, wt overall about the same;No fever, wt loss, worsening night sweats, loss of appetite or other constitutional symptoms .  Pt denies polydipsia, polyuria, or low sugar symptoms such as shakiness improved with eating.  Overall good compliance with meds, trying to follow low chol, DM diet, wt stable, little excercise however   Also some pain to the left upper back as well, ? related to prior neck surgury, worse to raise the left arm above horizontal ;  no recent worsening St, fever, cough and Pt denies CP, worsening sob, doe, wheezing, orthopnea, pnd, worsening LE edema, palps, dizziness or syncope Has some intermittent hoarseness intermittent liklye due to nasal allergies and post nasal gtt.  Problems Prior to Update: 1)  Back Pain  (ICD-724.5) 2)  Thoracic/lumbosacral Neuritis/radiculitis Unspec  (ICD-724.4) 3)  Back Pain With Radiculopathy  (ICD-729.2) 4)  Chest Pain-painful Respiration  (ICD-786.52) 5)  Depression/anxiety  (ICD-300.4) 6)  Myocardial Perfusion Scan, With Stress Test, Abnormal  (ICD-794.39) 7)  Chest Pain  (ICD-786.50) 8)  Allergic Reaction, Acute  (ICD-995.3) 9)  Cough   (ICD-786.2) 10)  Pulmonary Nodule  (ICD-518.89) 11)  Early Satiety  (ICD-780.94) 12)  Abnormal Chest Xray  (ICD-793.1) 13)  Groin Pain  (ICD-789.09) 14)  Early Satiety  (ICD-789.9) 15)  Abdominal Pain, Generalized  (ICD-789.07) 16)  Weight Loss  (ICD-783.21) 17)  Preventive Health Care  (ICD-V70.0) 18)  Hyperlipidemia  (ICD-272.4) 19)  Allergic Rhinitis  (ICD-477.9) 20)  Osteopenia  (ICD-733.90) 21)  Diverticulosis, Colon  (ICD-562.10) 22)  Ibs  (ICD-564.1) 23)  Anxiety  (ICD-300.00) 24)  Diabetes Mellitus, Type II  (ICD-250.00) 25)  Gerd  (ICD-530.81) 26)  Hypertension  (ICD-401.9)  Medications Prior to Update: 1)  Amlodipine Besylate 10 Mg Tabs (Amlodipine Besylate) .[name].. 1po Once Daily 2)  Tramadol Hcl 50 Mg  Tabs (Tramadol Hcl) .... Take 1 Tablet By Mouth Q 6 Hrs As Needed 3)  Adult Aspirin Ec Low Strength 81 Mg  Tbec (Aspirin) .[name].. 1 By Mouth Once Daily 4)  Premarin 0.625 Mg Tabs (Estrogens Conjugated) .[name].. 1 By Mouth Once Daily 5)  Hydrochlorothiazide 12.5 Mg Tabs (Hydrochlorothiazide) .[name].. 1 By Mouth Once Daily 6)  Prilosec Otc 20 Mg Tbec (Omeprazole Magnesium) .... Once Daily 7)  Citalopram Hydrobromide 10 Mg Tabs (Citalopram Hydrobromide) .[name].. 1po Once Daily 8)  Advil 200 Mg Tabs (Ibuprofen) .... As Needed  Current Medications (verified): 1)  Amlodipine Besylate 10 Mg  Tabs (Amlodipine Besylate) .[name].. 1po Once Daily 2)  Tramadol Hcl 50 Mg  Tabs (Tramadol Hcl) .... Take 1 Tablet By Mouth Q 6 Hrs As Needed 3)  Adult Aspirin Ec Low Strength 81 Mg  Tbec (Aspirin) .[name].. 1 By Mouth Once Daily 4)  Premarin 0.625 Mg Tabs (Estrogens Conjugated) .[name].. 1 By Mouth Once Daily 5)  Hydrochlorothiazide 12.5 Mg Tabs (Hydrochlorothiazide) .[name].. 1 By Mouth Once Daily 6)  Prilosec Otc 20 Mg Tbec (Omeprazole Magnesium) .... Once Daily 7)  Citalopram Hydrobromide 10 Mg Tabs (Citalopram Hydrobromide) .[name].. 1po Once Daily 8)  Advil 200 Mg Tabs (Ibuprofen) .... As Needed 9)  Flexeril 5 Mg Tabs  (Cyclobenzaprine Hcl) .[name].. 1 By Mouth Three Times A Day 10)  Prednisone 10 Mg Tabs (Prednisone) .... 3po Qd For 3days, Then 2po Qd For 3days, Then 1po Qd For 3days, Then Stop  Allergies (verified): 1)  ! * Tetanus 2)  ! Ace Inhibitors  Past History:  Past Medical History: Last updated: 05/13/2009 1. Hypertension 2. GERD with h/o esophageal stricture and esophageal diverticulum 3. Anxiety 4. IBS/chronic constipation 5. Diverticulosis, colon 6 .Osteopenia 7. c-spine DJD 8. Allergic rhinitis 9. Hyperlipidemia 10. Pulmonary nodules: PET with intermediate uptake.  Transbronchial biopsy was indeterminant.  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  11. Chest pain: Lexiscan myoview (11/10): EF 70% with partially reversible mid-apical anterior perfusion defect. Breast attenuation versus anterior ischemia.  Hard to rule out ischemia as defect was partially reversible.  Coronary CT angiogram was done to followup abnormal myoview.  This showed calcium score 0 and no evident coronary disease.  12. 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   Head:  normocephalic and atraumatic.   Eyes:  vision grossly intact, pupils equal, and pupils round.   Ears:  R ear normal and L ear normal.   Nose:  no external deformity and no nasal discharge.   Mouth:  no gingival abnormalities and pharynx pink and moist.   Neck:  supple and no masses.   Lungs:  normal  respiratory effort and normal breath sounds.   Heart:  normal rate and regular rhythm.   Abdomen:  soft, non-tender, and normal bowel sounds.   Msk:  spine nontender throughout;  no paravertebral tender to thoracic (including periscapular left) and lumbar;  has some tender to deep palpation over the right SI joint area; no paravertebral swelling, erythema, rash Extremities:  no edema, no erythema  Neurologic:  strength normal in all extremities, sensation intact to light touch, gait normal, and DTRs symmetrical and normal.   Skin:  color normal and no rashes.   Psych:  not depressed appearing and moderately anxious.     Impression & Recommendations:  Problem # 1:  THORACIC/LUMBOSACRAL NEURITIS/RADICULITIS UNSPEC (ICD-724.4)  Her updated medication list for this problem includes:    Tramadol Hcl 50 Mg Tabs (Tramadol hcl) .[name]... Take 1 tablet by mouth q 6 hrs as needed    Adult Aspirin Ec Low Strength 81 Mg Tbec (Aspirin) .Marland KitchenMarland KitchenMarland KitchenMarland Kitchen  1 by mouth once daily    Advil 200 Mg Tabs (Ibuprofen) .[name]... As needed    Flexeril 5 Mg Tabs (Cyclobenzaprine hcl) .[name]... 1 by mouth three times a day right side, suspect underlying recurrent flare of lumbar DJD/DDD or SI joint dz, cant completely r/o radiculitis but afeb, doubt malignancy or other, for treat as above, f/u any worsening signs or symptoms , and pred pack for home, and trial flexeril as needed   Problem # 2:  BACK PAIN (ICD-724.5)  Her updated medication list for this problem includes:    Tramadol Hcl 50 Mg Tabs (Tramadol hcl) .[name]... Take 1 tablet by mouth q 6 hrs as needed    Adult Aspirin Ec Low Strength 81 Mg Tbec (Aspirin) .[name]... 1 by mouth once daily    Advil 200 Mg Tabs (Ibuprofen) .[name]... As needed    Flexeril 5 Mg Tabs (Cyclobenzaprine hcl) .[name]... 1 by mouth three times a day left upper back/left periscapular - exam bening, suspect related to c-spine dz, exam benign,  treat as above, f/u any worsening signs or symptoms   Problem # 3:  DIABETES  MELLITUS, TYPE II (ICD-250.00)  Her updated medication list for this problem includes:    Adult Aspirin Ec Low Strength 81 Mg Tbec (Aspirin) .[name]... 1 by mouth once daily  Labs Reviewed: Creat: 0.7 (04/15/2009)    Reviewed HgBA1c results: 6.1 (08/21/2008) stable overall by hx and exam, ok to continue meds/tx as is ; no need for OHA, not on steroid chronic  Problem # 4:  HYPERTENSION (ICD-401.9)  Her updated medication list for this problem includes:    Amlodipine Besylate 10 Mg Tabs (Amlodipine besylate) .[name]... 1po once daily    Hydrochlorothiazide 12.5 Mg Tabs (Hydrochlorothiazide) .[name]... 1 by mouth once daily  BP today: 112/64 Prior BP: 102/64 (11/30/2009)  Labs Reviewed: K+: 3.8 (04/15/2009) Creat: : 0.7 (04/15/2009)   Chol: 191 (08/21/2008)   HDL: 57.50 (08/21/2008)   LDL: 112 (08/21/2008)   TG: 107.0 (08/21/2008) stable overall by hx and exam, ok to continue meds/tx as is   Complete Medication List: 1)  Amlodipine Besylate 10 Mg Tabs (Amlodipine besylate) .[name].. 1po once daily 2)  Tramadol Hcl 50 Mg Tabs (Tramadol hcl) .... Take 1 tablet by mouth q 6 hrs as needed 3)  Adult Aspirin Ec Low Strength 81 Mg Tbec (Aspirin) .[name].. 1 by mouth once daily 4)  Premarin 0.625 Mg Tabs (Estrogens conjugated) .[name].. 1 by mouth once daily 5)  Hydrochlorothiazide 12.5 Mg Tabs (Hydrochlorothiazide) .[name].. 1 by mouth once daily 6)  Prilosec Otc 20 Mg Tbec (Omeprazole magnesium) .... Once daily 7)  Citalopram Hydrobromide 10 Mg Tabs (Citalopram hydrobromide) .[name].. 1po once daily 8)  Advil 200 Mg Tabs (Ibuprofen) .... As needed 9)  Flexeril 5 Mg Tabs (Cyclobenzaprine hcl) .[name].. 1 by mouth three times a day 10)  Prednisone 10 Mg Tabs (Prednisone) .... 3po qd for 3days, then 2po qd for 3days, then 1po qd for 3days, then stop  Patient Instructions: 1)  Please take all new medications as prescribed 2)  Continue all previous medications as before this visit  3)  Please schedule a follow-up appointment in 2  months with CPX labs Prescriptions: PREDNISONE 10 MG TABS (PREDNISONE) 3po qd for 3days, then 2po qd for 3days, then 1po qd for 3days, then stop  #18 x 0   Entered and Authorized by:   Biagio Borg MD   Signed by:   Biagio Borg MD on 11/30/2009  Method used:   Print then Give to Patient   RxID:   [id number] FLEXERIL 5 MG TABS (CYCLOBENZAPRINE HCL) 1 by mouth three times a day  #60 x 2   Entered and Authorized by:   Biagio Borg MD   Signed by:   Biagio Borg MD on 11/30/2009   Method used:   Print then Give to Patient   RxID:   [id number] TRAMADOL HCL 50 MG  TABS (TRAMADOL HCL) Take 1 tablet by mouth q 6 hrs as needed  #120 x 3   Entered and Authorized by:   Biagio Borg MD   Signed by:   Biagio Borg MD on 11/30/2009   Method used:   Print then Give to Patient   RxID:   [id number]

## 2010-06-10 NOTE — Assessment & Plan Note (Signed)
Summary: sleep difficult-pneu vac -not eating--stc   Vital Signs:  Patient profile:   65 year old female Height:      63 inches Weight:      121 pounds BMI:     21.51 O2 Sat:      98 % on Room air Temp:     98.2 degrees F oral Pulse rate:   66 / minute BP sitting:   120 / 78  (left arm) Cuff size:   regular  Vitals Entered ByMarland Kitchen Shirlean Mylar Ewing (June 01, 2009 4:24 PM)  O2 Flow:  Room air CC: Prescriptions for sleep and appetite increase/RE   Primary Care Provider:  [name], MD  CC:  Prescriptions for sleep and appetite increase/RE.  History of Present Illness: here with ongoing stress over the past 6 mo over her pulm situation , difficulty with making an exact diagnosis and now referral to [facility];  she is here with another [employer] employee for support;  pt states increased depressive and anxiety symptoms over the past month wtihout suicidal ideation or panic problem;  feels as if she did not have pulm issue she would feel much better;  has developed wt loss, depressed mood, increased irritability and anxiety; fatigue and sleep more, anhedonia;  no other significant family, work or other issue to account for mood. Has has numerous labs including TSH in the past 6 months, and o/w denies hypothyroid type symptoms.  No recnet rx med changes, no increased OTC med use, no illicit drug use.  She does not feel she needs counseling at this point, and does have personal support outside of coworkers as well.   Problems Prior to Update: 1)  Depression/anxiety  (ICD-300.4) 2)  Myocardial Perfusion Scan, With Stress Test, Abnormal  (ICD-794.39) 3)  Chest Pain  (ICD-786.50) 4)  Allergic Reaction, Acute  (ICD-995.3) 5)  Cough  (ICD-786.2) 6)  Pulmonary Nodule  (ICD-518.89) 7)  Early Satiety  (ICD-780.94) 8)  Abnormal Chest Xray  (ICD-793.1) 9)  Groin Pain  (ICD-789.09) 10)  Early Satiety  (ICD-789.9) 11)  Abdominal Pain, Generalized  (ICD-789.07) 12)  Weight Loss  (ICD-783.21) 13)  Preventive  Health Care  (ICD-V70.0) 14)  Hyperlipidemia  (ICD-272.4) 15)  Allergic Rhinitis  (ICD-477.9) 16)  Osteopenia  (ICD-733.90) 17)  Diverticulosis, Colon  (ICD-562.10) 18)  Ibs  (ICD-564.1) 19)  Anxiety  (ICD-300.00) 20)  Diabetes Mellitus, Type II  (ICD-250.00) 21)  Gerd  (ICD-530.81) 22)  Hypertension  (ICD-401.9)  Medications Prior to Update: 1)  Amlodipine Besylate 10 Mg Tabs (Amlodipine Besylate) .[name].. 1po Once Daily 2)  Tramadol Hcl 50 Mg  Tabs (Tramadol Hcl) .... Take 1 Tablet By Mouth Q 6 Hrs As Needed 3)  Adult Aspirin Ec Low Strength 81 Mg  Tbec (Aspirin) .[name].. 1 By Mouth Once Daily 4)  Premarin 0.625 Mg Tabs (Estrogens Conjugated) .[name].. 1 By Mouth Once Daily 5)  Hydrochlorothiazide 12.5 Mg Tabs (Hydrochlorothiazide) .[name].. 1 By Mouth Once Daily 6)  Prilosec Otc 20 Mg Tbec (Omeprazole Magnesium) .... Once Daily 7)  Acetaminophen 325 Mg Tabs (Acetaminophen) .... As Needed  Current Medications (verified): 1)  Amlodipine Besylate 10 Mg Tabs (Amlodipine Besylate) .[name].. 1po Once Daily 2)  Tramadol Hcl 50 Mg  Tabs (Tramadol Hcl) .... Take 1 Tablet By Mouth Q 6 Hrs As Needed 3)  Adult Aspirin Ec Low Strength 81 Mg  Tbec (Aspirin) .[name].. 1 By Mouth Once Daily 4)  Premarin 0.625 Mg Tabs (Estrogens Conjugated) .[name].. 1 By Mouth Once Daily 5)  Hydrochlorothiazide 12.5 Mg Tabs (Hydrochlorothiazide) .[name].. 1 By Mouth Once Daily 6)  Prilosec Otc 20 Mg Tbec (Omeprazole Magnesium) .... Once Daily 7)  Acetaminophen 325 Mg Tabs (Acetaminophen) .... As Needed 8)  Alprazolam 0.25 Mg Tabs (Alprazolam) .[name].. 1-2 By Mouth Two Times A Day As Needed 9)  Citalopram Hydrobromide 10 Mg Tabs (Citalopram Hydrobromide) .[name].. 1po Once Daily  Allergies (verified): 1)  ! * Tetanus 2)  ! Ace Inhibitors  Past History:  Past Medical History: Last updated: 05/13/2009 1. Hypertension 2. GERD with h/o esophageal stricture and esophageal diverticulum 3. Anxiety 4. IBS/chronic constipation 5. Diverticulosis, colon 6  .Osteopenia 7. c-spine DJD 8. Allergic rhinitis 9. Hyperlipidemia 10. Pulmonary nodules: PET with intermediate uptake.  Transbronchial biopsy was indeterminant.  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  11. Chest pain: Lexiscan myoview (11/10): EF 70% with partially reversible mid-apical anterior perfusion defect. Breast attenuation versus anterior ischemia.  Hard to rule out ischemia as defect was partially reversible.  Coronary CT angiogram was done to followup abnormal myoview.  This showed calcium score 0 and no evident coronary disease.  12. 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   Head:  normocephalic and atraumatic.   Eyes:  vision grossly intact, pupils equal, and pupils round.   Ears:  R ear normal and L ear normal.   Nose:  no external deformity and no nasal discharge.   Mouth:  no gingival abnormalities and pharynx pink and moist.   Neck:  supple and no masses.   Lungs:  normal respiratory effort and normal breath sounds.   Heart:  normal rate and regular rhythm.   Extremities:  no edema, no erythema  Psych:  depressed affect and moderately anxious.     Impression & Recommendations:  Problem # 1:  DEPRESSION/ANXIETY (ICD-300.4) situational for the most part, declines counseling, assoc with sleep difficutly but not wanting ambien and trying to minimize meds -   Problem # 2:  HYPERTENSION (ICD-401.9)  Her updated medication list for this problem includes:     Amlodipine Besylate 10 Mg Tabs (Amlodipine besylate) .[name]... 1po once daily    Hydrochlorothiazide 12.5 Mg Tabs (Hydrochlorothiazide) .[name]... 1 by mouth once daily  BP today: 120/78 Prior BP: 144/78 (05/13/2009)  Labs Reviewed: K+: 3.8 (04/15/2009) Creat: : 0.7 (04/15/2009)   Chol: 191 (08/21/2008)   HDL: 57.50 (08/21/2008)   LDL: 112 (08/21/2008)   TG: 107.0 (08/21/2008) stable overall by hx and exam, ok to continue meds/tx as is   Problem # 3:  DIABETES MELLITUS, TYPE II (ICD-250.00)  Her updated medication list for this problem includes:    Adult Aspirin Ec Low Strength 81 Mg Tbec (Aspirin) .[name]... 1 by mouth once daily certainlly no need for OHA at this time, to follow  Labs Reviewed: Creat: 0.7 (04/15/2009)    Reviewed HgBA1c results: 6.1 (08/21/2008)  Complete Medication List: 1)  Amlodipine Besylate 10 Mg Tabs (Amlodipine besylate) .[name].. 1po once daily 2)  Tramadol Hcl 50 Mg Tabs (Tramadol hcl) .... Take 1 tablet by  mouth q 6 hrs as needed 3)  Adult Aspirin Ec Low Strength 81 Mg Tbec (Aspirin) .[name].. 1 by mouth once daily 4)  Premarin 0.625 Mg Tabs (Estrogens conjugated) .[name].. 1 by mouth once daily 5)  Hydrochlorothiazide 12.5 Mg Tabs (Hydrochlorothiazide) .[name].. 1 by mouth once daily 6)  Prilosec Otc 20 Mg Tbec (Omeprazole magnesium) .... Once daily 7)  Acetaminophen 325 Mg Tabs (Acetaminophen) .... As needed 8)  Alprazolam 0.25 Mg Tabs (Alprazolam) .[name].. 1-2 by mouth two times a day as needed 9)  Citalopram Hydrobromide 10 Mg Tabs (Citalopram hydrobromide) .[name].. 1po once daily  Patient Instructions: 1)  Please take all new medications as prescribed 2)  Continue all previous medications as before this visit  3)  Please schedule a follow-up appointment in 1 month. Prescriptions: CITALOPRAM HYDROBROMIDE 10 MG TABS (CITALOPRAM HYDROBROMIDE) 1po once daily  #90 x 3   Entered and Authorized by:   Biagio Borg MD   Signed by:   Biagio Borg MD on 06/01/2009   Method used:   Print then  Give to Patient   RxID:   [id number] ALPRAZOLAM 0.25 MG TABS (ALPRAZOLAM) 1-2 by mouth two times a day as needed  #60 x 2   Entered and Authorized by:   Biagio Borg MD   Signed by:   Biagio Borg MD on 06/01/2009   Method used:   Print then Give to Patient   RxID:   [id number]

## 2010-06-10 NOTE — Assessment & Plan Note (Signed)
Summary: DR Jen Mow PT/NO SLOT-SINUS PROBLEM   --STC   Vital Signs:  Patient profile:   65 year old female Height:      63.5 inches (161.29 cm) Weight:      125 pounds (56.82 kg) O2 Sat:      99 % on Room air Temp:     98.9 degrees F (37.17 degrees C) oral Pulse rate:   67 / minute BP sitting:   112 / 68  (left arm)  Vitals Entered By: Tomma Lightning RMA (April 09, 2010 1:57 PM)  O2 Flow:  Room air CC: Sinus problem Is Patient Diabetic? Yes Did you bring your meter with you today? No Pain Assessment Patient in pain? no        Primary Care Provider:  [name], MD  CC:  Sinus problem.  History of Present Illness: c/o  nasal congestion  onset 3 weeks ago, course wax.wane but getting worse overall not relieved with OTC sinus meds a/wt mod maxillary/frontal pain, headache  - also greens nasal dc no ST or fever, but sick contacts  Current Medications (verified): 1)  Amlodipine Besylate 10 Mg Tabs (Amlodipine Besylate) .[name].. 1po Once Daily 2)  Tramadol Hcl 50 Mg  Tabs (Tramadol Hcl) .... Take 1 Tablet By Mouth Q 6 Hrs As Needed 3)  Adult Aspirin Ec Low Strength 81 Mg  Tbec (Aspirin) .[name].. 1 By Mouth Once Daily 4)  Premarin 0.625 Mg Tabs (Estrogens Conjugated) .[name].. 1 By Mouth Once Daily 5)  Hydrochlorothiazide 12.5 Mg Tabs (Hydrochlorothiazide) .[name].. 1 By Mouth Once Daily 6)  Prilosec Otc 20 Mg Tbec (Omeprazole Magnesium) .... Once Daily 7)  Citalopram Hydrobromide 10 Mg Tabs (Citalopram Hydrobromide) .[name].. 1po Once Daily 8)  Advil 200 Mg Tabs (Ibuprofen) .... As Needed 9)  Fluticasone Propionate 50 Mcg/act Susp (Fluticasone Propionate) .... 2 Spary/side Once Daily 10)  Simvastatin 20 Mg Tabs (Simvastatin) .[name].. 1 By Mouth Once Daily  Allergies (verified): 1)  ! * Tetanus 2)  ! Ace Inhibitors  Past History:  Past Medical History: 1. Hypertension  2. GERD with h/o esophageal stricture and esophageal diverticulum 3. Anxiety 4. IBS/chronic constipation 5. Diverticulosis,  colon 6 .Osteopenia 7. c-spine DJD 8. Allergic rhinitis 9. Hyperlipidemia 10. Pulmonary nodules: PET with intermediate uptake.  Transbronchial biopsy was indeterminant.  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  11. Chest pain: Lexiscan myoview (11/10): EF 70% with partially reversible mid-apical anterior perfusion defect. Breast attenuation versus anterior ischemia.  Hard to rule out ischemia as defect was partially reversible.  Coronary CT angiogram was done to followup abnormal myoview.  This showed calcium score 0 and no evident coronary disease.  12. Echo (11/10): EF 60%, mild LAE, PASP 31 mmHg  Review of Systems  The patient denies anorexia, weight loss, vision loss, decreased hearing, syncope, and dyspnea on exertion.    Physical Exam  General:  alert, well-developed, well-nourished, and cooperative to examination.   uncomforatble and mildly ill Head:  Normocephalic and atraumatic without obvious abnormalities. No apparent alopecia or balding. Eyes:  vision grossly intact; pupils equal, round and reactive to light.  conjunctiva and lids normal.    Ears:  R ear normal and L ear with hazy TM, no erythema.   Mouth:  teeth and gums in good repair; mucous membranes moist, without lesions or ulcers. oropharynx clear without exudate, mod erythema and PND Lungs:  normal respiratory effort, no intercostal retractions or use of accessory muscles; normal breath  sounds bilaterally - no crackles and no wheezes.    Heart:  normal rate, regular rhythm, no murmur, and no rub. BLE without edema.   Impression & Recommendations:  Problem # 1:  ACUTE SINUSITIS, UNSPECIFIED (ICD-461.9)  Her updated medication list for this problem includes:    Fluticasone Propionate 50 Mcg/act Susp (Fluticasone propionate) .[name]... 2 spary/side once daily    Amoxicillin-pot Clavulanate 875-125 Mg Tabs (Amoxicillin-pot clavulanate) .[name]... 1 by mouth two times a day x  7 days  Instructed on treatment. Call if symptoms persist or worsen.   Orders: Prescription Created Electronically [phone])  Problem # 2:  ALLERGIC RHINITIS (ICD-477.9)  Her updated medication list for this problem includes:    Fluticasone Propionate 50 Mcg/act Susp (Fluticasone propionate) .[name]... 2 spary/side once daily  treat as above, f/u any worsening signs or symptoms   Complete Medication List: 1)  Amlodipine Besylate 10 Mg Tabs (Amlodipine besylate) .[name].. 1po once daily 2)  Tramadol Hcl 50 Mg Tabs (Tramadol hcl) .... Take 1 tablet by mouth q 6 hrs as needed 3)  Adult Aspirin Ec Low Strength 81 Mg Tbec (Aspirin) .[name].. 1 by mouth once daily 4)  Premarin 0.625 Mg Tabs (Estrogens conjugated) .[name].. 1 by mouth once daily 5)  Hydrochlorothiazide 12.5 Mg Tabs (Hydrochlorothiazide) .[name].. 1 by mouth once daily 6)  Prilosec Otc 20 Mg Tbec (Omeprazole magnesium) .... Once daily 7)  Citalopram Hydrobromide 10 Mg Tabs (Citalopram hydrobromide) .[name].. 1po once daily 8)  Advil 200 Mg Tabs (Ibuprofen) .... As needed 9)  Fluticasone Propionate 50 Mcg/act Susp (Fluticasone propionate) .... 2 spary/side once daily 10)  Simvastatin 20 Mg Tabs (Simvastatin) .[name].. 1 by mouth once daily 11)  Amoxicillin-pot Clavulanate 875-125 Mg Tabs (Amoxicillin-pot clavulanate) .[name].. 1 by mouth two times a day x 7 days  Patient Instructions: 1)  it was good to see you today. 2)  augmentin pills for antibiotics - your prescription has been electronically submitted to your pharmacy. Please take as directed. Contact our office if you believe you're having problems with the medication(s). 3)  use this in addition to your nose spray and sinus medication as discussed  4)  Get plenty of rest, drink lots of clear liquids, and use Tylenol or Ibuprofen for fever and comfort. Return in 7-10 days if you're not better:sooner if you're feeling worse. Prescriptions: AMOXICILLIN-POT CLAVULANATE 875-125 MG TABS (AMOXICILLIN-POT CLAVULANATE)  1 by mouth two times a day x 7 days  #14 x 0   Entered and Authorized by:   Rowe Clack MD   Signed by:   Rowe Clack MD on 04/09/2010   Method used:   Electronically to        [facility] (retail)       [place], [place]  [id number]       Ph: [phone]       Fax: [phone]   RxID:   [id number]    Orders Added: 1)  Est. Patient Level IV [id number] 2)  Prescription Created Electronically [phone]

## 2010-06-10 NOTE — Assessment & Plan Note (Signed)
Summary: 6 months/apc   Visit Type:  Follow-up Copy to:  [name], MD Primary Provider/Referring Provider:  [name], MD  CC:  6 month follow up, pt states dr at [facility] wants MR to schedule ct chest scan, Pt states she is having some right side pain x1 month ago, pt states it is a stabbing pain, Pt states breathing is getting a little worse since hot whether has started, and pt have some weakness.  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 Aadmission 03/09/2009 for atupical chest pain and has been ruled out for both CAD and PE with investigations. She underwent salivary gland bx on 03/28/2009 and this showed chronic sialadentis but was negative for Sjogren. She saw [facility] Dr. Harlene Salts in Spring 2011; after extensive review etiology not known. Serial CT scan approach adopted  OV7/25/2011. This is 6 month followup. Overal ldoing well. WEight is stable. Main complaint is the chronic "chest" pan post incision she complained off.  This sstarted post-lung biopsy in July 2010. In Jan 2011 she described it asa  band like area at the level of thoractomy incisiin but extending anterior and posterior to the left  infra-axillary  incisin site. Constant. slowly progressive. At its best pain is a 8 of 10.  Made worse by deep inspiration. Made better by strecthing her arms cranially. But today states that pain is in infrascapular on left side. Constatn. Not made worse with inspiration. No clear cut aggravating factors. Massage and pain relief meds help pain. Rates it as 6 of 10. Dull gnawing pain. Assciated neck pain present and some radicular symptoms in neck.   Also c/o constant pain in right back x 1 month. Dull. Moderate intensity. No clear cut aggravating or relieving factors. Has mild sciatica in the gluteal region only.   Denies  fever, cough, hemoptysis, weight loss, night sweats, joint issues.   Preventive Screening-Counseling & Management  Alcohol-Tobacco     Smoking Status: quit     Smoking Cessation Counseling: no     Packs/Day: <1.0 ppd     Year Started: age 68     Year Quit: 20 years ago  Current Medications (verified): 1)  Amlodipine Besylate 10 Mg Tabs (Amlodipine Besylate) .[name].. 1po Once Daily 2)  Tramadol Hcl 50 Mg  Tabs (Tramadol Hcl) .... Take 1 Tablet By Mouth Q 6 Hrs As Needed 3)  Adult Aspirin Ec Low Strength 81 Mg  Tbec (Aspirin) .[name].. 1 By Mouth Once Daily 4)  Premarin 0.625 Mg Tabs (Estrogens Conjugated) .[name].. 1 By Mouth Once Daily 5)  Hydrochlorothiazide 12.5 Mg Tabs (Hydrochlorothiazide) .[name].. 1 By Mouth Once Daily 6)  Prilosec Otc 20 Mg Tbec (Omeprazole Magnesium) .... Once Daily 7)  Citalopram Hydrobromide 10 Mg Tabs (Citalopram Hydrobromide) .[name].. 1po Once Daily 8)  Advil 200 Mg Tabs (Ibuprofen) .... As Needed  Allergies (verified): 1)  ! * Tetanus 2)  ! Ace Inhibitors  Past History:  Family History: Last updated: 04/15/2009 father with throat cancer No premature CAD.   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 1. Hypertension 2. GERD with h/o esophageal stricture and esophageal diverticulum 3.  Anxiety 4. IBS/chronic constipation 5. Diverticulosis, colon 6 .Osteopenia 7. c-spine DJD 8. Allergic rhinitis 9. Hyperlipidemia 10. Pulmonary nodules: PET with intermediate uptake.  Transbronchial biopsy was indeterminant.  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  11. Chest pain: Lexiscan myoview (11/10): EF 70% with partially reversible mid-apical  anterior perfusion defect. Breast attenuation versus anterior ischemia.  Hard to rule out ischemia as defect was partially reversible.  Coronary CT angiogram was done to followup abnormal myoview.  This showed calcium score 0 and no evident coronary disease.  12. Echo (11/10): EF 60%, mild LAE, PASP 31 mmHg  Past Surgical History: Reviewed history from 12/25/2007 and no changes required. s/p c-spine surgury Hysterectomy Oophorectomy - bilateral Tonsillectomy  Family History: Reviewed history from 04/15/2009 and no changes required. father with throat cancer No premature CAD.   Social History: Reviewed history from 08/29/2008 and no changes required. Divorced 2 children Former Smoker - quit 1990s, smoked x 15 years, <1ppd Alcohol use-yes English as a second language teacher - retiredPacks/Day:  <1.0 ppd  Review of Systems       The patient complains of shortness of breath with activity, shortness of breath at rest, loss of appetite, and difficulty swallowing.  The patient denies productive cough, non-productive cough, coughing up blood, chest pain, irregular heartbeats, acid heartburn, indigestion, abdominal pain, sore throat, tooth/dental problems, headaches, nasal congestion/difficulty breathing through nose, sneezing, itching, ear ache, anxiety, depression, hand/feet swelling, rash, change in color of mucus, and fever.    Vital Signs:  Patient profile:   65 year old female Height:      63 inches Weight:      119.2 pounds O2 Sat:      100 % on Room air Temp:     98.1 degrees F oral Pulse rate:   53 / minute BP sitting:   102 / 64  (right arm) Cuff size:   regular  Vitals Entered By: Charma Igo (November 30, 2009 1:34 PM)  O2 Flow:  Room air CC: 6 month follow up, pt states dr at [facility] wants MR to schedule ct chest scan, Pt states she is having some right side pain x1 month ago, pt states it is a stabbing pain, Pt states breathing is getting a little worse since hot whether has started, pt have some  weakness Is Patient Diabetic? No Comments meds and allergies updated Phone number updated Charma Igo  November 30, 2009 1:34 PM    Physical Exam  General:  wd female in nad Head:  normocephalic and atraumatic Eyes:  PERRLA/EOM intact; conjunctiva and sclera clear Ears:  TMs intact and clear with normal canals Nose:  no deformity, discharge, inflammation, or lesions Mouth:  no deformity or lesions Neck:  no swelling noted Chest Wall:  no deformities noted tender to touch on left costochondral junction Lungs:  no stridor or other respiratory noise chest totally clear to auscultation Heart:  rrr, no mrg Abdomen:  bowel sounds positive; abdomen soft and non-tender without masses, or organomegaly Msk:  no deformity or scoliosis noted with normal posture Pulses:  pulses normal Extremities:  no clubbing, cyanosis, edema, or deformity noted Neurologic:  CN II-XII grossly intact with normal reflexes, coordination, muscle strength and tone Skin:  intact without lesions or rashes Cervical Nodes:  no significant adenopathy Axillary Nodes:  no significant adenopathy Psych:  alert and cooperative; normal mood and affect; normal attention span and concentration   Impression & Recommendations:  Problem # 1:  CHEST PAIN-PAINFUL RESPIRATION (  [id number]) Assessment Deteriorated  In Jan 2011 she had stated this was related to post thoractomy incisioin. Now this is a left infrascapular pain. I am wondering if this is related to her chronic cervical diseae. I have asked her to talk to PMD About this. Will get CT chest for the nodules and see if it will shed some light Her updated medication list for this problem includes:    Adult Aspirin Ec Low Strength 81 Mg Tbec (Aspirin) .[name]... 1 by mouth once daily    Advil 200 Mg Tabs (Ibuprofen) .[name]... As needed  Orders: Est. Patient Level IV [id number])  Problem # 2:  COUGH (ICD-786.2) Assessment: Improved  This is likely due to pollen and sinus issues.  Unlikely to be related to nodules but you never know. As of July 2011 this is resolved  plan monitor  Orders: Est. Patient Level IV [id number])  Problem # 3:  PULMONARY NODULE (ICD-518.89) Assessment: Unchanged  She has had spontaenous improvement in her lung dissease. This is based on weight gain and improvement in nodule size on 01/27/2009 Ct. However, nodules have remained stable in size Sept 2010 -> Jan 2011. Still very reasuring that we are not dealing with malignant process. extensive wu for Sjogren's is now negative.She has seen [facility]. Madalyn [facility] at [facility] and the diagnosis is unsure at this stage. PAth feels it is related to autoimmune diseae but clinical workup is negative.  Will repeat CT chest and forward results to Dr. Madalyn Rob.   Orders: Est. Patient Level IV [id number])  Problem # 4:  BACK PAIN WITH RADICULOPATHY (ICD-729.2) Assessment: New  ? cause. HAVe asked her to talk to PMD  Orders: Est. Patient Level IV [id number])  Medications Added to Medication List This Visit: 1)  Advil 200 Mg Tabs (Ibuprofen) .... As needed  Other Orders: Radiology Referral (Radiology)  Patient Instructions: 1)  please have ct scan chest 2)  I do not know what is causing the left back-chest pain and rt low-back pain. Pleae check with your primary care MD.  3)  I will call you with results of scan

## 2010-06-10 NOTE — Progress Notes (Signed)
  Phone Note Call from Patient Call back at Home Phone [phone]   Caller: Patient Reason for Call: Talk to Nurse Summary of Call: Patient lmovm stating that she is returning a cll back to nurse Initial call taken by: Estell Harpin CMA,  February 03, 2010 9:29 AM  Follow-up for Phone Call        called pt back informed of lab results. Follow-up by: Shirlean Mylar Ewing CMA Deborra Medina),  February 03, 2010 9:50 AM

## 2010-06-10 NOTE — Progress Notes (Signed)
Summary: returning call  Phone Note From Other Clinic Call back at [phone]   Caller: [place]. Call For: [name] Request: Talk with Nurse Summary of Call: Returning [name]'s call. Initial call taken by: Netta Neat,  August 21, 2009 2:27 PM  Follow-up for Phone Call        Pt saw Dr. Wolfgang Phoenix at [facility], this call was from Dr. Garwin Brothers office, apparently [facility] operator had transferred me to wrong office on 08-20-09. I called operator back and was given contact number for Dr. Wolfgang Phoenix at [phone]. I LMTCB with Dr. Wolfgang Phoenix office to call me about letter we received. Bee Bing CMA  August 21, 2009 2:55 PM

## 2010-06-10 NOTE — Assessment & Plan Note (Signed)
Summary: 2 MOS F/U #/CD   Vital Signs:  Patient profile:   65 year old female Height:      63.5 inches Weight:      125 pounds BMI:     21.87 O2 Sat:      99 % on Room air Temp:     98.5 degrees F oral Pulse rate:   59 / minute BP sitting:   102 / 60  (left arm) Cuff size:   regular  Vitals Entered By: Shirlean Mylar Ewing CMA ([place]) (February 02, 2010 10:30 AM)  O2 Flow:  Room air  Preventive Care Screening  Mammogram:    Date:  02/06/2009    Results:  normal      declines pneumovax, and dxa today  CC: 2 month followup/RE   Primary Care Provider:  [name], MD  CC:  2 month followup/RE.  History of Present Illness: here for wellness and f/u - states having more right lwer back pain in the past 1 wk, mod to severe, wtihout bowel or bladder change, no gait change, fever or wt loss, and no RLE worsening pain, weak, numb, fall or injury.  advil  no longer working, prednisone seemed to help in the past, flexeril did not seem to help;   also with nasal congestion without fever, pain, headache ST,, zyrtec otc not working as well now  appetitie better now, wt up to 125   Preventive Screening-Counseling & Management      Drug Use:  no.    Problems Prior to Update: 1)  Back Pain  (ICD-724.5) 2)  Thoracic/lumbosacral Neuritis/radiculitis Unspec  (ICD-724.4) 3)  Back Pain With Radiculopathy  (ICD-729.2) 4)  Chest Pain-painful Respiration  (ICD-786.52) 5)  Depression/anxiety  (ICD-300.4) 6)  Myocardial Perfusion Scan, With Stress Test, Abnormal  (ICD-794.39) 7)  Chest Pain  (ICD-786.50) 8)  Allergic Reaction, Acute  (ICD-995.3) 9)  Cough  (ICD-786.2) 10)  Pulmonary Nodule  (ICD-518.89) 11)  Early Satiety  (ICD-780.94) 12)  Abnormal Chest Xray  (ICD-793.1) 13)  Groin Pain  (ICD-789.09) 14)  Early Satiety  (ICD-789.9) 15)  Abdominal Pain, Generalized  (ICD-789.07) 16)  Weight Loss  (ICD-783.21) 17)  Preventive Health Care  (ICD-V70.0) 18)  Hyperlipidemia  (ICD-272.4) 19)   Allergic Rhinitis  (ICD-477.9) 20)  Osteopenia  (ICD-733.90) 21)  Diverticulosis, Colon  (ICD-562.10) 22)  Ibs  (ICD-564.1) 23)  Anxiety  (ICD-300.00) 24)  Diabetes Mellitus, Type II  (ICD-250.00) 25)  Gerd  (ICD-530.81) 26)  Hypertension  (ICD-401.9)  Medications Prior to Update: 1)  Amlodipine Besylate 10 Mg Tabs (Amlodipine Besylate) .[name].. 1po Once Daily 2)  Tramadol Hcl 50 Mg  Tabs (Tramadol Hcl) .... Take 1 Tablet By Mouth Q 6 Hrs As Needed 3)  Adult Aspirin Ec Low Strength 81 Mg  Tbec (Aspirin) .[name].. 1 By Mouth Once Daily 4)  Premarin 0.625 Mg Tabs (Estrogens Conjugated) .[name].. 1 By Mouth Once Daily 5)  Hydrochlorothiazide 12.5 Mg Tabs (Hydrochlorothiazide) .[name].. 1 By Mouth Once Daily 6)  Prilosec Otc 20 Mg Tbec (Omeprazole Magnesium) .... Once Daily 7)  Citalopram Hydrobromide 10 Mg Tabs (Citalopram Hydrobromide) .[name].. 1po Once Daily 8)  Advil 200 Mg Tabs (Ibuprofen) .... As Needed 9)  Flexeril 5 Mg Tabs (Cyclobenzaprine Hcl) .[name].. 1 By Mouth Three Times A Day 10)  Prednisone 10 Mg Tabs (Prednisone) .... 3po Qd For 3days, Then 2po Qd For 3days, Then 1po Qd For 3days, Then Stop  Current Medications (verified): 1)  Amlodipine Besylate 10 Mg Tabs (Amlodipine  Besylate) .[name].. 1po Once Daily 2)  Tramadol Hcl 50 Mg  Tabs (Tramadol Hcl) .... Take 1 Tablet By Mouth Q 6 Hrs As Needed 3)  Adult Aspirin Ec Low Strength 81 Mg  Tbec (Aspirin) .[name].. 1 By Mouth Once Daily 4)  Premarin 0.625 Mg Tabs (Estrogens Conjugated) .[name].. 1 By Mouth Once Daily 5)  Hydrochlorothiazide 12.5 Mg Tabs (Hydrochlorothiazide) .[name].. 1 By Mouth Once Daily 6)  Prilosec Otc 20 Mg Tbec (Omeprazole Magnesium) .... Once Daily 7)  Citalopram Hydrobromide 10 Mg Tabs (Citalopram Hydrobromide) .[name].. 1po Once Daily 8)  Advil 200 Mg Tabs (Ibuprofen) .... As Needed 9)  Prednisone 10 Mg Tabs (Prednisone) .... 3po Qd For 3days, Then 2po Qd For 3days, Then 1po Qd For 3days, Then Stop 10)  Fluticasone Propionate 50 Mcg/act Susp (Fluticasone  Propionate) .... 2 Spary/side Once Daily  Allergies (verified): 1)  ! * Tetanus 2)  ! Ace Inhibitors  Past History:  Past Medical History: Last updated: 05/13/2009 1. Hypertension 2. GERD with h/o esophageal stricture and esophageal diverticulum 3. Anxiety 4. IBS/chronic constipation 5. Diverticulosis, colon 6 .Osteopenia 7. c-spine DJD 8. Allergic rhinitis 9. Hyperlipidemia 10. Pulmonary nodules: PET with intermediate uptake.  Transbronchial biopsy was indeterminant.  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  11. Chest pain: Lexiscan myoview (11/10): EF 70% with partially reversible mid-apical anterior perfusion defect. Breast attenuation versus anterior ischemia.  Hard to rule out ischemia as defect was partially reversible.  Coronary CT angiogram was done to followup abnormal myoview.  This showed calcium score 0 and no evident coronary disease.  12. Echo (11/10): EF 60%, mild LAE, PASP 31 mmHg  Past Surgical History: Last updated: 12/25/2007 s/p c-spine surgury Hysterectomy Oophorectomy - bilateral Tonsillectomy  Family History: Last updated: 04/15/2009 father with throat cancer No premature CAD.   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         all otherwise negative per pt -    Physical Exam  General:  alert and well-developed.   Head:  normocephalic and atraumatic.   Eyes:  vision grossly intact, pupils equal, and pupils round.   Ears:  R ear normal and L ear normal.   Nose:  nasal dischargemucosal pallor and mucosal edema.   Mouth:  no gingival abnormalities and pharynx pink and moist.   Neck:  supple and no masses.   Lungs:  normal respiratory effort and normal breath sounds.   Heart:  normal rate and regular rhythm.   Abdomen:  soft, non-tender, and normal bowel sounds.   Msk:  spine nontender throughout;  no paravertebral tender ,   has some tender to deep palpation over the right SI joint area; no paravertebral swelling, erythema, rash Extremities:  no edema, no  erythema  Neurologic:  strength normal in all extremities, sensation intact to light touch, gait normal, and DTRs symmetrical and normal.   Skin:  color normal and no rashes.   Psych:  not anxious appearing and not depressed appearing.     Impression & Recommendations:  Problem # 1:  Preventive Health Care (ICD-V70.0)  Overall doing well, age appropriate education and counseling updated and referral for appropriate preventive services done unless declined, immunizations up to date or declined, diet counseling done if overweight, urged to quit smoking if smokes , most recent labs reviewed and current ordered if appropriate, ecg reviewed or declined (interpretation per ECG scanned in the EMR if done); information regarding Medicare Prevention requirements given if appropriate; speciality referrals updated as appropriate   Orders: TLB-BMP (Basic Metabolic Panel-BMET) (99991111) TLB-CBC Platelet - w/Differential  (85025-CBCD) TLB-Hepatic/Liver Function Pnl (80076-HEPATIC) TLB-Lipid Panel (80061-LIPID) TLB-TSH (Thyroid Stimulating Hormone) (84443-TSH) TLB-Udip ONLY (81003-UDIP)  Problem # 2:  BACK PAIN WITH RADICULOPATHY (ICD-729.2) right side, for pred pack, and tramadol prn  Problem # 3:  ALLERGIC RHINITIS (ICD-477.9)  Her updated medication list for this problem includes:    Fluticasone Propionate 50 Mcg/act Susp (Fluticasone propionate) .[name]... 2 spary/side once daily treat as above, f/u any worsening signs or symptoms   Complete Medication List: 1)  Amlodipine Besylate 10 Mg Tabs (Amlodipine besylate) .[name].. 1po once daily 2)  Tramadol Hcl 50 Mg Tabs (Tramadol hcl) .... Take 1 tablet by mouth q 6 hrs as needed 3)  Adult Aspirin Ec Low Strength 81 Mg Tbec (Aspirin) .[name].. 1 by mouth once daily 4)  Premarin 0.625 Mg Tabs (Estrogens conjugated) .[name].. 1 by mouth once daily 5)  Hydrochlorothiazide 12.5 Mg Tabs (Hydrochlorothiazide) .[name].. 1 by mouth once daily 6)  Prilosec Otc 20 Mg Tbec (Omeprazole magnesium) .... Once daily 7)  Citalopram Hydrobromide 10 Mg Tabs (Citalopram hydrobromide) .[name].. 1po once daily 8)  Advil 200 Mg Tabs (Ibuprofen) .... As needed 9)  Prednisone 10 Mg Tabs (Prednisone) .... 3po qd for 3days, then 2po qd for 3days, then 1po qd for 3days, then stop 10)  Fluticasone Propionate 50 Mcg/act Susp (Fluticasone propionate) .... 2 spary/side once daily 11)  Simvastatin 20 Mg Tabs (Simvastatin) .[name].. 1 by mouth once daily  Other Orders: TLB-Microalbumin/Creat Ratio, Urine (82043-MALB) TLB-A1C / Hgb A1C (Glycohemoglobin) (83036-A1C)  Patient Instructions: 1)  Please take all new medications as prescribed  - the prednisone and tramadol, and the generic flonase 2)  Please go to the Lab in the basement for your blood and/or urine tests today 3)  Please call the number on the [facility] for results of your testing  4)  Please schedule a follow-up appointment in 1 year or sooner if  needed Prescriptions: FLUTICASONE PROPIONATE 50 MCG/ACT SUSP (FLUTICASONE PROPIONATE) 2 spary/side once daily  #1 x 11   Entered and Authorized by:   Biagio Borg MD   Signed by:   Biagio Borg MD on 02/02/2010   Method used:   Electronically to        [facility] (retail)       [place], [place]  [id number]       Ph: [phone]       Fax: [phone]   RxIDZO:[id number] TRAMADOL HCL 50 MG  TABS (TRAMADOL HCL) Take 1 tablet by mouth q 6 hrs as needed  #120 x 3   Entered and Authorized by:  Biagio Borg MD   Signed by:   Biagio Borg MD on 02/02/2010   Method used:   Electronically to        [facility] (retail)       [place], [place]  [id number]       Ph: [phone]       Fax: [phone]   RxIDME:[id number] PREDNISONE 10 MG TABS (PREDNISONE) 3po qd for 3days, then 2po qd for 3days, then 1po qd for 3days, then stop  #18 x 0   Entered and Authorized by:   Biagio Borg MD   Signed by:   Biagio Borg MD on 02/02/2010   Method used:   Electronically to        [facility] (retail)       [address]       [place], [place]  [id number]       Ph: [phone]       Fax: [phone]   RxID:   [id number]

## 2010-06-10 NOTE — Consult Note (Signed)
Summary: [facility]   Imported By: Phillis Knack 08/13/2009 11:00:05  _____________________________________________________________________  External Attachment:    Type:   Image     Comment:   External Document

## 2010-06-17 ENCOUNTER — Encounter: Payer: Self-pay | Admitting: Internal Medicine

## 2010-06-17 ENCOUNTER — Ambulatory Visit (INDEPENDENT_AMBULATORY_CARE_PROVIDER_SITE_OTHER): Payer: MEDICARE | Admitting: Internal Medicine

## 2010-06-17 DIAGNOSIS — J984 Other disorders of lung: Secondary | ICD-10-CM

## 2010-06-24 NOTE — Assessment & Plan Note (Signed)
Summary: return office visit/jd   Visit Type:  Follow-up Copy to:  [name], MD Primary Provider/Referring Provider:  [name], MD  CC:  Pt here for follow-up.  Pt c/o pain one left side at previous biopsy site. 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 Aadmission 03/09/2009 for atupical chest pain and has been ruled out for both CAD and PE with investigations. She underwent salivary gland bx on 03/28/2009 and this showed chronic sialadentis but was negative for Sjogren. She saw [facility] Dr. Harlene Salts in Spring 2011; after extensive review etiology not known. Serial CT scan approach adopted  OV7/25/2011. This is 6 month followup. Overal ldoing well. WEight is stable. Main complaint is the chronic "chest" pan post incision she complained off.  This sstarted post-lung biopsy in July 2010. In Jan 2011 she described it asa  band like area at the level of thoractomy incisiin but extending anterior and posterior to the left  infra-axillary  incisin site. Constant. slowly progressive. At its best pain is a 8 of 10.  Made worse by deep inspiration. Made better by strecthing her arms cranially. But today states that pain is in infrascapular on left side. Constatn. Not made worse with inspiration. No clear cut aggravating factors. Massage and pain relief meds help pain. Rates it as 6 of 10. Dull gnawing pain. Assciated neck pain present and some radicular symptoms in neck.   Also c/o constant pain in right back x 1 month. Dull. Moderate intensity. No clear cut aggravating or relieving factors. Has mild sciatica in the gluteal region only.   Denies fever, cough, hemoptysis, weight loss, night sweats, joint issues.   REC: CT scan shows improvement -> Rec followup 1 year  June 17, 2010: She is due for 1 year followup only in  July 2012 but she is here earlier. c/o 6 weeks of night sweats, moderate in intensity, wakes her up each night, no aggravating/relieving factors. Also, has 6 weeks of left sided chest pain in the infra-axillary area esp along ribs and worst on scar sites. Constant. Worsened with body movement. Better by being still. No radiation. Cough is same. Denies asociated dyspnea, weight loss, nausea or vomit or diarrhea. Otherewise well.    Preventive Screening-Counseling & Management  Alcohol-Tobacco     Smoking Status: quit     Smoking Cessation Counseling: no     Packs/Day: <1.0 ppd     Year Started: age 84     Year Quit: 20 years ago  Current Medications (verified): 1)  Amlodipine Besylate 10 Mg Tabs (Amlodipine Besylate) .[name].. 1by Mouth Once Daily 2)  Tramadol Hcl 50 Mg  Tabs (Tramadol Hcl) .... Take 1 Tablet By Mouth Q 6 Hrs As Needed 3)  Adult Aspirin Ec Low Strength 81 Mg  Tbec (Aspirin) .[name].. 1 By Mouth Once Daily 4)  Premarin 0.625 Mg Tabs (Estrogens Conjugated) .[name].. 1 By Mouth Once Daily 5)  Hydrochlorothiazide 12.5 Mg Tabs (Hydrochlorothiazide) .[name].. 1 By Mouth Once Daily 6)  Prilosec Otc 20 Mg Tbec (Omeprazole Magnesium) .... Once Daily 7)  Citalopram Hydrobromide 10 Mg Tabs (Citalopram Hydrobromide) .[name].. 1by Mouth Once Daily 8)  Advil 200 Mg Tabs (Ibuprofen) .... As Needed 9)  Fluticasone Propionate 50 Mcg/act Susp (Fluticasone Propionate) .... 2 Spary/side Once Daily 10)  Simvastatin 20 Mg Tabs (Simvastatin) .[name].. 1 By Mouth Once  Daily  Allergies (verified): 1)  ! * Tetanus 2)  ! Ace Inhibitors  Past History:  Past medical, surgical, family and social histories (including risk factors) reviewed, and no changes noted (except as noted below).  Past Medical History: Reviewed history from 04/09/2010 and no changes required. 1. Hypertension  2. GERD with h/o esophageal stricture and esophageal diverticulum 3. Anxiety 4. IBS/chronic constipation 5. Diverticulosis, colon 6  .Osteopenia 7. c-spine DJD 8. Allergic rhinitis 9. Hyperlipidemia 10. Pulmonary nodules: PET with intermediate uptake.  Transbronchial biopsy was indeterminant.  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  11. Chest pain: Lexiscan myoview (11/10): EF 70% with partially reversible mid-apical anterior perfusion defect. Breast attenuation versus anterior ischemia.  Hard to rule out ischemia as defect was partially reversible.  Coronary CT angiogram was done to followup abnormal myoview.  This showed calcium score 0 and no evident coronary disease.  12. Echo (11/10): EF 60%, mild LAE, PASP 31 mmHg  Past Surgical History: Reviewed history from 12/25/2007 and no changes required. s/p c-spine surgury Hysterectomy Oophorectomy - bilateral Tonsillectomy  Family History: Reviewed history from 04/15/2009 and no changes required. father with throat cancer No premature CAD.   Social History: Reviewed history from 02/02/2010 and no changes required. 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  The patient denies shortness of breath with activity, shortness of breath at rest, productive cough, non-productive cough, coughing up blood, irregular heartbeats, acid heartburn, indigestion, loss of appetite, weight change, abdominal pain, difficulty swallowing, sore throat, tooth/dental problems, headaches, sneezing, itching, ear ache, anxiety, depression, hand/feet swelling, joint stiffness or pain, rash, change in color of mucus, and fever.         night sweats  Vital Signs:  Patient profile:   65 year old female Height:      63.5 inches Weight:      127 pounds BMI:     22.22 O2 Sat:      100 % on Room air Temp:     98.2 degrees F oral Pulse  rate:   58 / minute BP sitting:   108 / 58  (left arm) Cuff size:   regular  Vitals Entered By: [place] Bing CMA (June 17, 2010 9:07 AM)  O2 Flow:  Room air CC: Pt here for follow-up.  Pt c/o pain one left side at previous biopsy site.  Comments Medications reviewed with patient Ruston Bing CMA  June 17, 2010 9:08 AM Daytime phone number verified with patient.    Physical Exam  General:  wd female in nad Head:  normocephalic and atraumatic Eyes:  PERRLA/EOM intact; conjunctiva and sclera clear Ears:  TMs intact and clear with normal canals Nose:  no deformity, discharge, inflammation, or lesions Mouth:  no deformity or lesions Neck:  no swelling noted Chest Wall:  no deformities noted tender to touch on left costochondral junction also very tender on scar sites of lung bx overall palpation of left infra-axillary areas makes chest pain worse Lungs:  no stridor or other respiratory noise chest totally clear to auscultation Heart:  rrr, no mrg Abdomen:  bowel sounds positive; abdomen soft and non-tender without masses, or organomegaly Msk:  no deformity or scoliosis noted with normal posture Pulses:  pulses normal Extremities:  no clubbing, cyanosis, edema, or deformity noted Neurologic:  CN II-XII grossly intact with normal reflexes, coordination, muscle strength and tone Skin:  intact without lesions or rashes Cervical Nodes:  no significant adenopathy Axillary Nodes:  no significant adenopathy Psych:  alert and cooperative; normal mood and affect; normal attention span and concentration   Impression & Recommendations:  Problem # 1:  PULMONARY NODULE (ICD-518.89) Assessment Deteriorated  She has had spontaenous improvement in her lung dissease. This is based on weight gain and improvement in nodule size on 01/27/2009 Ct. However, nodules have remained stable in size Sept 2010 -> Jan 2011 -> July 2011. Very reasuring that we are not dealing with malignant  process. extensive wu for Sjogren's is now negative.She has seen Dr. Madalyn Rob at [facility] and the diagnosis is unsure.[name] PAth feels it is related to autoimmune diseae but clinical workup is negative.    On Feb 2012 viist complans of night sweats and left chest pain htat is neuropathic in nature. I wil get repeat CT chest. IF parenchymal lung diseaes is stable will address pain as neuropathic and recommend neurontin and for night sweats will refer to primary.   Orders: Radiology Referral (Radiology) Est. Patient Level III [id number])  Medications Added to Medication List This Visit: 1)  Amlodipine Besylate 10 Mg Tabs (Amlodipine besylate) .[name].. 1by mouth once daily 2)  Citalopram Hydrobromide 10 Mg Tabs (Citalopram hydrobromide) .[name].. 1by mouth once daily  Patient Instructions: 1)  please hvae CT chest and return for followup

## 2010-08-11 LAB — COMPREHENSIVE METABOLIC PANEL
AST: 23 U/L (ref 0–37)
Albumin: 3 g/dL — ABNORMAL LOW (ref 3.5–5.2)
BUN: 12 mg/dL (ref 6–23)
BUN: 16 mg/dL (ref 6–23)
CO2: 30 mEq/L (ref 19–32)
Calcium: 8.8 mg/dL (ref 8.4–10.5)
Calcium: 9.2 mg/dL (ref 8.4–10.5)
Chloride: 104 mEq/L (ref 96–112)
Creatinine, Ser: 0.76 mg/dL (ref 0.4–1.2)
Creatinine, Ser: 0.88 mg/dL (ref 0.4–1.2)
GFR calc Af Amer: >60 mL/min (ref >60)
GFR calc non Af Amer: >60 mL/min (ref >60)
Glucose, Bld: 107 mg/dL — ABNORMAL HIGH (ref 70–99)
Total Bilirubin: 0.4 mg/dL (ref 0.3–1.2)
Total Bilirubin: 0.6 mg/dL (ref 0.3–1.2)
Total Protein: 6.4 g/dL (ref 6.0–8.3)

## 2010-08-11 LAB — DIFFERENTIAL
Basophils Absolute: 0 10*3/uL (ref 0.0–0.1)
Eosinophils Absolute: 0.2 10*3/uL (ref 0.0–0.7)
Eosinophils Relative: 0 % (ref 0–5)
Lymphocytes Relative: 11 % — ABNORMAL LOW (ref 12–46)
Lymphocytes Relative: 13 % (ref 12–46)
Lymphs Abs: 0.8 10*3/uL (ref 0.7–4.0)
Lymphs Abs: 1.3 10*3/uL (ref 0.7–4.0)
Monocytes Relative: 4 % (ref 3–12)
Neutrophils Relative %: 80 % — ABNORMAL HIGH (ref 43–77)
Neutrophils Relative %: 85 % — ABNORMAL HIGH (ref 43–77)

## 2010-08-11 LAB — CBC
HCT: 34.2 % — ABNORMAL LOW (ref 36.0–46.0)
HCT: 35.4 % — ABNORMAL LOW (ref 36.0–46.0)
HCT: 40.8 % (ref 36.0–46.0)
Hemoglobin: 11.4 g/dL — ABNORMAL LOW (ref 12.0–15.0)
MCHC: 33.3 g/dL (ref 30.0–36.0)
MCHC: 33.7 g/dL (ref 30.0–36.0)
MCV: 93.7 fL (ref 78.0–100.0)
MCV: 94.1 fL (ref 78.0–100.0)
MCV: 94.3 fL (ref 78.0–100.0)
Platelets: 215 10*3/uL (ref 150–400)
RBC: 3.65 MIL/uL — ABNORMAL LOW (ref 3.87–5.11)
RBC: 4.34 MIL/uL (ref 3.87–5.11)
RDW: 13.8 % (ref 11.5–15.5)
WBC: 10 10*3/uL (ref 4.0–10.5)
WBC: 7.4 10*3/uL (ref 4.0–10.5)

## 2010-08-11 LAB — POCT I-STAT, CHEM 8
Creatinine, Ser: 0.9 mg/dL (ref 0.4–1.2)
HCT: 43 % (ref 36.0–46.0)
Hemoglobin: 14.6 g/dL (ref 12.0–15.0)
Potassium: 3.6 mEq/L (ref 3.5–5.1)
Sodium: 138 mEq/L (ref 135–145)

## 2010-08-11 LAB — CARDIAC PANEL(CRET KIN+CKTOT+MB+TROPI)
CK, MB: 2.7 ng/mL (ref 0.3–4.0)
Relative Index: 2.5 (ref 0.0–2.5)
Relative Index: 2.7 — ABNORMAL HIGH (ref 0.0–2.5)
Total CK: 129 U/L (ref 7–177)
Troponin I: 0.01 ng/mL (ref 0.00–0.06)
Troponin I: <0.01 ng/mL (ref 0.00–0.06)

## 2010-08-11 LAB — LIPID PANEL
HDL: 85 mg/dL (ref >39)
LDL Cholesterol: 77 mg/dL (ref 0–99)
Total CHOL/HDL Ratio: 2.1 RATIO
Triglycerides: 97 mg/dL (ref <150)
VLDL: 19 mg/dL (ref 0–40)

## 2010-08-11 LAB — T3: T3, Total: 97 ng/dl (ref 80.0–204.0)

## 2010-08-11 LAB — POCT CARDIAC MARKERS
CKMB, poc: 3 ng/mL (ref 1.0–8.0)
Myoglobin, poc: 92.2 ng/mL (ref 12–200)

## 2010-08-11 LAB — FOLATE: Folate: 12 ng/mL

## 2010-08-11 LAB — T4: T4, Total: 9.2 ug/dL (ref 5.0–12.5)

## 2010-08-11 LAB — HEPATITIS PANEL, ACUTE
HCV Ab: NEGATIVE
Hep A IgM: NEGATIVE
Hep B C IgM: NEGATIVE

## 2010-08-11 LAB — APTT: aPTT: 29 seconds (ref 24–37)

## 2010-08-11 LAB — RETICULOCYTES: Retic Ct Pct: 0.5 % (ref 0.4–3.1)

## 2010-08-11 LAB — IRON AND TIBC: Saturation Ratios: 18 % — ABNORMAL LOW (ref 20–55)

## 2010-08-11 LAB — VITAMIN B12: Vitamin B-12: 349 pg/mL (ref 211–911)

## 2010-08-11 LAB — MAGNESIUM: Magnesium: 2.2 mg/dL (ref 1.5–2.5)

## 2010-08-11 LAB — PROTIME-INR: Prothrombin Time: 12.7 seconds (ref 11.6–15.2)

## 2010-08-15 LAB — GLUCOSE, CAPILLARY

## 2010-08-15 LAB — FUNGUS CULTURE W SMEAR

## 2010-08-15 LAB — AFB CULTURE WITH SMEAR (NOT AT ARMC): Acid Fast Smear: NONE SEEN

## 2010-08-15 LAB — POCT I-STAT 3, ART BLOOD GAS (G3+)
Acid-Base Excess: 4 mmol/L — ABNORMAL HIGH (ref 0.0–2.0)
Bicarbonate: 29.5 mEq/L — ABNORMAL HIGH (ref 20.0–24.0)
TCO2: 31 mmol/L (ref 0–100)
pO2, Arterial: 136 mmHg — ABNORMAL HIGH (ref 80.0–100.0)

## 2010-08-15 LAB — COMPREHENSIVE METABOLIC PANEL
Albumin: 2.8 g/dL — ABNORMAL LOW (ref 3.5–5.2)
BUN: 4 mg/dL — ABNORMAL LOW (ref 6–23)
Calcium: 8.8 mg/dL (ref 8.4–10.5)
Creatinine, Ser: 0.76 mg/dL (ref 0.4–1.2)
Total Bilirubin: 0.8 mg/dL (ref 0.3–1.2)
Total Protein: 6.4 g/dL (ref 6.0–8.3)

## 2010-08-15 LAB — CBC
HCT: 31.4 % — ABNORMAL LOW (ref 36.0–46.0)
HCT: 33 % — ABNORMAL LOW (ref 36.0–46.0)
MCHC: 34.3 g/dL (ref 30.0–36.0)
MCV: 90.3 fL (ref 78.0–100.0)
MCV: 90.9 fL (ref 78.0–100.0)
Platelets: 223 10*3/uL (ref 150–400)
RBC: 3.65 MIL/uL — ABNORMAL LOW (ref 3.87–5.11)
RDW: 14.9 % (ref 11.5–15.5)
WBC: 12.3 10*3/uL — ABNORMAL HIGH (ref 4.0–10.5)

## 2010-08-15 LAB — BASIC METABOLIC PANEL
Chloride: 99 mEq/L (ref 96–112)
GFR calc Af Amer: >60 mL/min (ref >60)
Potassium: 3.2 mEq/L — ABNORMAL LOW (ref 3.5–5.1)

## 2010-08-15 LAB — TISSUE CULTURE: Culture: NO GROWTH

## 2010-08-16 LAB — CBC
MCHC: 34.2 g/dL (ref 30.0–36.0)
MCHC: 33.6 g/dL (ref 30.0–36.0)
Platelets: 242 10*3/uL (ref 150–400)
RBC: 3.81 MIL/uL — ABNORMAL LOW (ref 3.87–5.11)
RBC: 3.95 MIL/uL (ref 3.87–5.11)
WBC: 4.3 10*3/uL (ref 4.0–10.5)

## 2010-08-16 LAB — COMPREHENSIVE METABOLIC PANEL
ALT: 10 U/L (ref 0–35)
AST: 20 U/L (ref 0–37)
Albumin: 3.7 g/dL (ref 3.5–5.2)
CO2: 23 mEq/L (ref 19–32)
Calcium: 9.2 mg/dL (ref 8.4–10.5)
GFR calc Af Amer: >60 mL/min (ref >60)
GFR calc non Af Amer: >60 mL/min (ref >60)
Sodium: 135 mEq/L (ref 135–145)
Total Protein: 7 g/dL (ref 6.0–8.3)

## 2010-08-16 LAB — TYPE AND SCREEN
ABO/RH(D): A POS
Antibody Screen: NEGATIVE

## 2010-08-16 LAB — DIFFERENTIAL
Eosinophils Absolute: 0.3 10*3/uL (ref 0.0–0.7)
Eosinophils Relative: 8 % — ABNORMAL HIGH (ref 0–5)
Lymphs Abs: 1.5 10*3/uL (ref 0.7–4.0)
Monocytes Absolute: 0.3 10*3/uL (ref 0.1–1.0)
Monocytes Relative: 8 % (ref 3–12)

## 2010-08-16 LAB — ANAEROBIC CULTURE: Gram Stain: NONE SEEN

## 2010-08-16 LAB — URINALYSIS, ROUTINE W REFLEX MICROSCOPIC
Glucose, UA: NEGATIVE mg/dL
Hgb urine dipstick: NEGATIVE
Specific Gravity, Urine: 1.021 (ref 1.005–1.030)

## 2010-08-16 LAB — BLOOD GAS, ARTERIAL
Acid-Base Excess: 4.5 mmol/L — ABNORMAL HIGH (ref 0.0–2.0)
Drawn by: 181601
pCO2 arterial: 44.5 mmHg (ref 35.0–45.0)
pH, Arterial: 7.426 — ABNORMAL HIGH (ref 7.350–7.400)

## 2010-08-16 LAB — AFB CULTURE WITH SMEAR (NOT AT ARMC)

## 2010-08-16 LAB — APTT: aPTT: 36 seconds (ref 24–37)

## 2010-08-16 LAB — PROTIME-INR
INR: 1 (ref 0.00–1.49)
Prothrombin Time: 13.8 seconds (ref 11.6–15.2)

## 2010-08-16 LAB — FUNGUS CULTURE W SMEAR

## 2010-08-18 LAB — PATHOLOGIST SMEAR REVIEW

## 2010-08-18 LAB — MISCELLANEOUS TEST

## 2010-08-18 LAB — AFB CULTURE WITH SMEAR (NOT AT ARMC)

## 2010-08-18 LAB — CULTURE, BAL-QUANTITATIVE W GRAM STAIN: Gram Stain: NONE SEEN

## 2010-08-18 LAB — BODY FLUID CELL COUNT WITH DIFFERENTIAL
Eos, Fluid: 2 %
Lymphs, Fluid: 7 %
Neutrophil Count, Fluid: 86 % — ABNORMAL HIGH (ref 0–25)

## 2010-08-18 LAB — LEGIONELLA, DFA (W/OUT CULTURE): Legionella Antigen (DFA): NEGATIVE

## 2010-08-18 LAB — FUNGUS CULTURE W SMEAR: Fungal Smear: NONE SEEN

## 2010-08-19 ENCOUNTER — Other Ambulatory Visit: Payer: Self-pay | Admitting: Internal Medicine

## 2010-09-21 NOTE — Discharge Summary (Signed)
NAME:  [name]              ACCOUNT NO.:  [id number]   MEDICAL RECORD NO.:  [id number]          PATIENT TYPE:  INP   LOCATION:  2012                         FACILITY:  [facility]   PHYSICIAN:  Nicanor Alcon, M.D. DATE OF BIRTH:  1945/11/15   DATE OF ADMISSION:  11/06/2008  DATE OF DISCHARGE:  11/11/2008                               DISCHARGE SUMMARY   ADDITIONAL/DISCHARGE DIAGNOSES:  1. Left lung nodule.  2. Prior history of tobacco abuse.  3. Hypertension.   PROCEDURES PERFORMED:  Left video-assisted thoracic surgery with  biopsies of left upper and lower lobes.   HISTORY:  The patient is a 65 year old female who has had recent falls  with weight loss.  She has a history of bilateral pulmonary nodules  which have been followed for the past 5 years and has gradually  increased in size.  Recently, she had a PET scan which showed patchy ill-  defined opacities in both on lungs that are slightly hypermetabolic.  She underwent bronchoscopy with a needle biopsy.  This showed rare  reactive bronchial epithelial cells with rare multinucleated giant  cells, but no atypia.  Because of these findings, she was referred to  [name] for evaluation and treatment.  After review of her films, Dr.  Arlyce Dice felt that she would benefit from a VATS with biopsies for  diagnosis at this point.  He explained all risks, benefits, and  alternatives of the surgery to the patient and she agreed to proceed.   HOSPITAL COURSE:  She was admitted to [facility] of October 07, 2008, and underwent a left VATS with biopsies.  Please see previously  dictated operative report for complete details of surgery.  She  tolerated the procedure well and was transferred to the Step-Down Unit  in stable condition.  Postoperatively, she has done very well.  Her  chest tubes have been reviewed in the usual fashion.  Her followup chest  x-ray showed a stable apical space.  She has otherwise done well.  Her  incisions are all healing well.  She is obtaining adequate pain control  with p.o. pain medications.  She has remained afebrile and all vital  signs have been stable.  She is ambulating in the halls without  difficulty and has been weaned from supplemental oxygen maintaining sats  of greater than 90% on room air.  Intraoperative cultures were negative,  but her final pathology remains pending at this time.  Labs showed  sodium 135, potassium 3.7, BUN 4, and creatinine 0.76.  Hemoglobin 10.8,  hematocrit 31.4, white count 11.7, and platelets 223.  She will undergo  PA and lateral chest x-ray on morning of November 11, 2008.  It is  anticipated that if she has remained stable and her chest x-ray has  continued to remain stable she will be ready for discharge home.   DISCHARGE MEDICATIONS:  1. Nexium 40 mg daily.  2. Vicodin 1-2 q.4 h. p.r.n. for pain.  3. Aspirin 81 mg daily.  4. Premarin 0.625 mg daily.  5. Amlodipine 10 mg daily.  6. Hydrochlorothiazide 12.5 mg daily.  7. Centrum daily.   DISCHARGE INSTRUCTIONS:  She is asked to refrain from driving, heavy  lifting, or strenuous activity.  She may continue ambulating daily and  using her incentive spirometer.  She may shower daily and clean her  incisions with soap and water.  She will continue her same preoperative  diet.   DISCHARGE FOLLOWUP:  She will see [name] back in the office in 1  week with a chest x-ray.  In the interim, if she experiences any  problems or has questions, she is asked to contact our office  immediately.      [name], P.A.      Nicanor Alcon, M.D.  Electronically Signed    GC/MEDQ  D:  11/10/2008  T:  11/11/2008  Job:  [id number]   cc:   Brand Males, MD

## 2010-09-21 NOTE — Assessment & Plan Note (Signed)
OFFICE VISIT   [name], [name]  DOB:  May 08, 1946                                        December 02, 2008  CHART #:  [id number]   The patient came today complaining of some pain in left upper quadrant  and she just has some post thoracotomy pain in this area.  I started her  on Ultram 50 mg every 6 hours for the pain.  Hopefully, this will  resolve by the time we see her next.  Her blood pressure was 139/78,  pulse 76, respirations 18, and sats were 98%.   Nicanor Alcon, M.D.  Electronically Signed   DPB/MEDQ  D:  12/02/2008  T:  12/03/2008  Job:  [id number]

## 2010-09-21 NOTE — Op Note (Signed)
NAME:  BRITLEE, EASTERBROOK              ACCOUNT NO.:  [id number]   MEDICAL RECORD NO.:  [id number]          PATIENT TYPE:  AMB   LOCATION:  CARD                         FACILITY:  [facility]   PHYSICIAN:  Brand Males, MD   DATE OF BIRTH:  Feb 01, 1946   DATE OF PROCEDURE:  09/04/2008  DATE OF DISCHARGE:                               OPERATIVE REPORT   TYPE OF PROCEDURE:  Bronchoscopy with lavage and biopsy.   INDICATION:  1. Weight loss.  2. Pulmonary nodules.   PREPROCEDURE ASSESSMENT:  Is less than 46 days old, was performed on  August 31, 2008, in the office.  No interim changes noted.   PREPROCEDURE EVALUATION:  Height 63 inches, weight 130 pounds, blood  pressure 126/71, pulse of 77, respiratory rate of 15.  ASA class II.  Airway assessment Mallampati Class 1 with dentures.  Physical exam was  within normal limits.   PREPROCEDURE LABS:  INR 1.1, PTT 33.8, platelet count 341, hemoglobin  11.6, creatinine 0.7 and spirometry that was normal with an FEV-1 of  1.63 liters, 87% predicted.   Patient was considered acceptable for moderate sedation and could  undergo procedure.   PREPROCEDURE SEDATION PLAN:  Topical lidocaine anesthesia and also  fentanyl, Versed for moderate sedation.   Risks and contraindications and benefits and limitations were explained,  alternatives explained.  The patient accepted and decided to proceed.   PROCEDURE NOTE:  The bronchoscope was initially introduced through the  left naris, by advice of respiratory therapy, but that the deviated  nasal septum blocked entry; the right naris was also similarly  difficult.  It was then converted to an oral entry.  The scope was  introduced at 1547 hours.  The vocal cords were visualized, they were  normal.  A detailed airway exam was done of the trachea, the carina, the  right main bronchus, subcarina, subsegments on the right side.  The left  main bronchus, the secondary carinae and the left-sided subsegments  were  all normal.  After this, bronchoalveolar lavage of the right middle lobe  medial and lateral segments were performed with 120 mL saline with  returns greater than 40 mL but they were thin and gray.   Following this, under fluoroscopy the right lower lobe superior segment  nodule was biopsied using transbronchial biopsy forceps.  There was  significant difficulty.  Even though the nodule was localized by  fluoroscopy and the biopsy forceps was anchored in the fluoroscopy to  the nodule, there was the airway running through the nodule, therefore  the biopsy pieces were difficult to obtain; nevertheless, we got 4  biopsy pieces after 6 attempts.  Following this, the nodule in the right  middle lobe lateral segment was biopsied, again 3 attempts were made to  get 1 biopsy, a similar problem of the airway passing through the nodule  was noted.   After this the scope was withdrawn at 1616 hours.   SPECIAL COMMENTS:  Significant cough requiring intermittent doses of  sedation and lidocaine.  Total fentanyl used was 125 mcg, Versed 8 mg  and lidocaine 100 mg.  ESTIMATED BLOOD LOSS:  Less than 1 mL.   FINAL IMPRESSION:  Normal airway exam status post biopsy of the right  lower lobe and the right middle lobe, status post bronchoalveolar lavage  of the right middle lobe.   POSTPROCEDURE PLAN:  1. Get chest x-ray to rule out pneumothorax.  2. Observe in the sedation post recovery room.  3. Follow-up in the office for results.      Brand Males, MD  Electronically Signed     MR/MEDQ  D:  09/04/2008  T:  09/04/2008  Job:  [id number]

## 2010-09-21 NOTE — H&P (Signed)
NAME:  [name], [name]              ACCOUNT NO.:  [id number]   MEDICAL RECORD NO.:  [id number]          PATIENT TYPE:  INP   LOCATION:  NA                           FACILITY:  [facility]   PHYSICIAN:  Nicanor Alcon, M.D. DATE OF BIRTH:  [birth year]   DATE OF ADMISSION:  DATE OF DISCHARGE:                              HISTORY & PHYSICAL   CHIEF COMPLAINT:  Lung nodules.   HISTORY OF PRESENT ILLNESS:  A 65 year old African American female who  has had multiple problems with main problem being weight loss. She was  found to have bilateral pulmonary nodules several years ago and these  have increased in size in 5 years.  Pulmonary function test showed an  FVC of 2.56 with an FEV of 1.63.  The PET scan was slightly  hypermetabolic.  A bronchoscopy was done, which was nondiagnostic and  the needle biopsy showed rare  multinucleated giant cells, there are  several nodules in the left side, particularly in the lower lobe as well  as one on the right side.  She has had no excessive sputum, chills, or  fever.  She is admitted for biopsy of the nodules.   PAST MEDICAL HISTORY:   ALLERGIES:  She is allergic to TETANUS and ACE INHIBITORS.   MEDICATIONS:  1. __________ 10 mg a day.  2. Tramadol 50 mg a day.  3. Premarin.  4. Hydrochlorothiazide 12.5 mg daily.  5. __________ 0.2 daily.   SOCIAL HISTORY:  She is divorced, 2 children.  Quit smoking in 1990.  Occasional alcohol intake.   FAMILY HISTORY:  Positive for throat cancer.   REVIEW OF SYSTEMS:  GENERAL:  She has some weight loss and loss of  appetite.  CARDIAC:  No angina or atrial fibrillation.  PULMONARY:  No  hemoptysis.  GI:  No nausea, vomiting, constipation, or diarrhea.  GU:  No kidney disease, dysuria, or frequent urination.  VASCULAR:  No  claudication, DVT, or TIA.  NEUROLOGICAL:  No dizziness, headaches,  blackout, or seizure.  MUSCULOSKELETAL:  No arthritis.  PSYCHIATRIC:  No  depression or anxiety.  No change in her  eyesight or hearing.  HEMATOLOGICAL:  No problems with bleeding, clotting disorders, or  anemia.   PHYSICAL EXAMINATION:  VITAL SIGNS:  Her blood pressure is 138/76, pulse  66, respirations 18, and saturations were 98%.  HEENT:  Head is atraumatic.  Eyes, pupils equal and reactive to light  and accommodation.  Extraocular movements are normal.  Ears, tympanic  membranes are intact.  Nose, there is no septal deviation.  Throat is  without lesion.  NECK:  Supple without thyromegaly.  There is no supraclavicular or  axillary adenopathy.  CHEST:  Clear to auscultation and percussion.  HEART:  Regular sinus rhythm.  No murmurs.  ABDOMEN:  Soft.  No hepatosplenomegaly.  EXTREMITIES:  Pulses 2+.  No clubbing or edema.  NEUROLOGIC:  She is oriented x3.  Sensory and motor intact.  Cranial  nerves intact.   IMPRESSION:  1. Bilateral pulmonary nodules, left greater than right, rule out      cancer and rule out  inflammatory nodules.  2. Hypertension.   PLAN:  Left lung biopsy x2 or 3.      Nicanor Alcon, M.D.  Electronically Signed     DPB/MEDQ  D:  11/04/2008  T:  11/05/2008  Job:  [id number]

## 2010-09-21 NOTE — Letter (Signed)
October 29, 2008   Brand Males, MD  [address] [place], [place] [address]   Re:  [name], [name]              DOB:  11-23-1945   Dear Belva Crome,   I saw the patient today.  This 65 year old African American female has  had multiple problems.  Her main problem is being weight loss.  She was  found to have some bilateral pulmonary nodules that have gradually  increased in size over the last 5 years.  Her pulmonary function test  showed an FVC of 2.06 with an FEV-1 of 1.63.  For these nodules, had a  PET scan that showed patchy, ill-defined opacities in both lungs that  are slightly hypermetabolic and a bronchoscopy was done, diagnostic and  a needle biopsy showed rare reactive bronchial epithelial cells with  rare multinucleated giant cells.  No atypia.  She has several of these  ill-defined nodules on the left side particularly in the left lower lobe  as well as the right lower lobe nodule.  She has had no fever, chills,  or excessive sputum.   PAST MEDICAL HISTORY:  She is allergic to tetanus and ACE inhibitors.  She takes amlodipine 10 mg daily, tramadol 50 mg daily, aspirin,  Premarin 0.625, hydrochlorothiazide 12.5 daily, flunisolide 0.2%  2  puffs twice a day.   SOCIAL HISTORY:  She is divorced.  She has 2 children.  Quit smoking in  1990.  Occasional use of alcohol.   FAMILY HISTORY:  Positive for throat cancer.   REVIEW OF SYSTEMS:  She has had some weight loss, loss of appetite.  Cardiac:  No angina or atrial fibrillation.  Pulmonary:  No hemoptysis.  GI:  No nausea, vomiting, constipation, or diarrhea.  GU:  No kidney  disease, dysuria, or frequent urination.  Vascular:  No claudication,  DVT, or TIAs.  Neurologic:  No dizziness, headaches, blackouts, or  seizures.  Musculoskeletal:  No arthritis.  Psychiatric:  No depression  or nervousness.  Eyes/ENT:  No change in her eyesight or hearing.  Hematologic:  No problems with bleeding, clotting disorders, or  anemia.   PHYSICAL EXAMINATION:  VITAL SIGNS:  Her blood pressure was 138/76,  pulse 66, respirations 18, and saturations were 98%.  HEAD, EYES, EARS, NOSE, AND THROAT:  Unremarkable.  NECK:  Supple without thyromegaly.  There is no supraclavicular or  axillary adenopathy.  CHEST:  Clear to auscultation and percussion.  HEART:  Regular sinus rhythm.  No murmurs.  ABDOMEN:  Soft.  There is no hepatosplenomegaly.  EXTREMITIES:  Pulses are 2+.  There is no clubbing or edema.  NEUROLOGICAL:  She is oriented x3.  Sensory and motor intact.  Cranial  nerves intact.   I feel that she has these multilobulated  nodules which we are unable to  get a diagnosis.  I will plan to do a left VATS and biopsy of one or two  of these nodules, but hopefully we are going to determine what they are.  I doubt if they are cancer.  I think it is some type of inflammatory  process.  The patient agrees with this and we will schedule surgery on  November 07, 2008.   Nicanor Alcon, M.D.  Electronically Signed   DPB/MEDQ  D:  10/29/2008  T:  10/30/2008  Job:  [id number]

## 2010-09-21 NOTE — Letter (Signed)
May 27, 2009   Brand Males, MD  [address] [place], [place] [address]   Re:  DELTHA, VOELZ              DOB:  May 13, 1945   Dear Belva Crome,   I saw the patient back in the office today.  She still has some mild  chest wall pain.  I reassured her that this may take awhile for this to  get better but her incisions are well healed.  Chest x-ray showed normal  postoperative changes.  Her blood pressure is 135/76, pulse 60,  respirations 18, and sats were 98%.  I will see her back again if she  has any future problems.   Nicanor Alcon, M.D.  Electronically Signed   DPB/MEDQ  D:  05/27/2009  T:  05/27/2009  Job:  [id number]

## 2010-09-21 NOTE — Letter (Signed)
November 19, 2008   Brand Males, MD  [address] [place], [place] [address]   Re:  [name], [name]              DOB:  Sep 10, 1945   Dear Dr. Chase Caller:   I saw the patient back today.  Her incisions were well healed and I  removed her chest tube sutures.  Her chest x-ray was stable.  She is  having some mild pain.  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  I will  refer her to you for any possible treatment.  From my standpoint, she is  doing well.  Her blood pressure was 140/76, pulse 81, respirations 18,  and sats were 98%.  I will see her back again in 2 weeks with a chest x-  ray.   Sincerely,   Nicanor Alcon, M.D.  Electronically Signed   DPB/MEDQ  D:  11/19/2008  T:  11/20/2008  Job:  [id number]

## 2010-09-21 NOTE — Letter (Signed)
January 21, 2009   Brand Males, MD  [address] [place], [place] [address]   Re:  [name], [name]              DOB:  [birth year]   Dear Dr. Chase Caller;   The patient returns today and she is doing well.  Her chest x-ray is  showing further improvement of her both left and the right side of the  opacities.  Her lungs are clear to auscultation and percussion.  Her  blood pressure is 120/73, pulse 63, respirations 18, sats were 99%.  She  still has some tightness that is probably neurogenic in origin and  hopefully this will gradually improve.  Over my standpoint, I will place  her back to you for long-term followup.   I appreciate the opportunity of seeing the patient.    Sincerely,   Nicanor Alcon, M.D.  Electronically Signed   DPB/MEDQ  D:  01/21/2009  T:  01/22/2009  Job:  [id number]

## 2010-09-21 NOTE — Letter (Signed)
December 10, 2008   Brand Males, MD  [address] [place], [place] [address]   Re:  NANA, COHN              DOB:  [birth year]   Dear Belva Crome;   I saw the patient back today.  He still has some tenderness under left  costal margin, but this hopefully is doing better than what it was.  Her  chest x-ray is stable, although there is some area of a small  pneumatocele and the area will be operated.  Overall, though she seems  to be doing well.  Her blood pressure is 132/70, pulse 64, respirations  18, sats were 99%.  Lungs are clear to auscultation and percussion.  We  will see her back again in 6 weeks with a chest x-ray.   Nicanor Alcon, M.D.  Electronically Signed   DPB/MEDQ  D:  12/10/2008  T:  12/11/2008  Job:  [id number]

## 2010-09-21 NOTE — Op Note (Signed)
NAME:  [name], [name]              ACCOUNT NO.:  [id number]   MEDICAL RECORD NO.:  [id number]          PATIENT TYPE:  INP   LOCATION:  2314                         FACILITY:  [facility]   PHYSICIAN:  Nicanor Alcon, M.D. DATE OF BIRTH:  [birth year]   DATE OF PROCEDURE:  11/06/2008  DATE OF DISCHARGE:                               OPERATIVE REPORT   PREOPERATIVE DIAGNOSIS:  Pulmonary nodules.   POSTOPERATIVE DIAGNOSIS:  Pulmonary nodules.   OPERATION:  Left video-assisted thoracoscopic surgery wedge resection of  left lower lobe x3 and left upper lobe x1.   SURGEON:  Nicanor Alcon, MD   ANESTHESIA:  General anesthesia.   DESCRIPTION OF PROCEDURE:  After percutaneous insertion of all  monitoring lines, the patient's general anesthesia, was turned to the  left lateral thoracotomy position, was prepped and draped in the usual  sterile manner.  Two trocar sites were made in the anterior and  posterior axillary line, one at the seventh intercostal space and one at  the eighth intercostal space.  Two trocars were inserted.  Pictures were  taken.  The lesions could be seen on the left lower lobe and one on the  left upper lobe.  I then made a third trocar site anteriorly at the  midaxillary line at the fifth intercostal space and through that  palpated lesion in the inferior basilar segment and the lateral basilar  segment of the left lower lobe.  This was then grasped with a Jearld Lesch lung  clamp and the area was resected with two applications of the Covidien  stapler, 60 mm and 30 mm.  We then conducted frozen section and while  frozen section was done, I identified another area in the left lower  lobe that was more superiorly and resected that with two applications of  the Covidien stapler.  Finally, there was a large nodule in the superior  segment that was identified and that was resected with three  applications of the Covidien stapler.  There was some bleeding from the  staple line  and that had to be oversewn with 3-0 Vicryl coming in  through the mid trocar site, tying down extracorporeally.  Finally, we  turned our attention to a lesion in the posterior segment of the left  upper lobe.  It was grasped with a Duval lung clamp and then resected  with the Covidien 60 and 30-mm stapler.  The larger lesion from the  superior segment was cultured.  Frozen section came back and this was  some type of inflammatory process, but they could not tell exactly what.  We put two chest tubes in through the lower trocar sites and sutured in  place with 0-silk.  Using 28 trocars, a single On-Q was inserted.  The  superior trocar site was closed with one pericostal #1 Vicryl in the  muscle layer and 3-0 Vicryl in a subcuticular stitch.  The patient's  lung was expanded.  The patient returned to the recovery room in stable  condition.      Nicanor Alcon, M.D.  Electronically Signed     DPB/MEDQ  D:  11/06/2008  T:  11/07/2008  Job:  [id number]

## 2010-09-24 NOTE — Procedures (Signed)
[facility]  Patient:    [name], [name]                     MRN: [id number] Proc. Date: 02/15/00 Adm. Date:  [id number] Attending:  Neita Garnet CC:         Barbaraann Rondo, M.D.   Procedure Report  PROCEDURE:  Esophagogastroduodenoscopy.  INDICATION:  Reflux disease.  HISTORY OF PRESENT ILLNESS:  This is a 65 year old female recently evaluated for reflux symptoms.  See that dictation.  No true dysphagia.  She did have a globus sensation.  She was placed on Nexium which seems to have helped.  She is now for upper endoscopy.  INFORMED CONSENT:  The nature of the procedure as well as the risks, benefits and alternatives had been reviewed.  She understood and agreed to proceed.  PHYSICAL EXAMINATION:  Completed prior to colonoscopy.  See that dictation.  DESCRIPTION OF PROCEDURE:  After completed colonoscopy, the patient remained the left lateral decubitus position.  Additional sedation was provided with Demerol 20 mg and Versed 1 mg IV.  The Olympus endoscope was passed orally under direct vision into the esophagus.  The esophagus revealed a fibrous stricture at the gastroesophageal junction.  Approximately 1 cm above this was esophageal diverticulum.  The endoscope passed beyond the distal esophagus without resistance.  The stomach revealed a sliding hiatal hernia but was otherwise normal.  The duodenal bulb and postbulbar duodenum were normal.  IMPRESSION: 1. Gastroesophageal reflux disease complicated by peptic stricture. 2. 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  RECOMMENDATIONS: 1. Continue Nexium 40 mg daily. 2. Reflux precautions. 3. The patient has been asked to contact the office to make a visit with    myself in one month to assess ongoing response to therapy. DD:  02/15/00 TD:  02/16/00 Job: [id number]

## 2010-09-24 NOTE — Procedures (Signed)
[facility]  Patient:    [name], [name]                     MRN: [id number] Proc. Date: 02/15/00 Adm. Date:  [id number] Attending:  Neita Garnet CC:         Barbaraann Rondo, M.D.   Procedure Report  PROCEDURE:  Colonoscopy.  INDICATION:  Constipation and colorectal neoplasia screening.  HISTORY OF PRESENT ILLNESS:  This is a 65 year old female who was evaluated in the office January 11, 2000, for reflux symptoms and constipation.  Please see that dictation.  She is now for colonoscopy and upper endoscopy.  The nature of the procedure as well as the risks, benefits, and alternatives were reviewed.  She understood and agreed to proceed.  PHYSICAL EXAMINATION:  GENERAL:  Well-appearing female in no acute distress.  She is alert and oriented.  VITAL SIGNS:  Stable.  LUNGS:  Clear.  HEART:  Regular.  ABDOMEN:  Soft.  DESCRIPTION OF PROCEDURE:  After informed consent was obtained, the patient was sedated with 70 mg of Demerol and 6 mg of Versed IV.  Digital rectal was performed and found to be unremarkable.  The Olympus colonoscope was passed under direct vision per rectum through the entire length of the colon to the cecal tip.  Preparation was excellent.  The terminal ileum was intubated and appeared normal.  Careful examination of the colonic mucosa from the cecal tip to rectum revealed sigmoid diverticulosis.  A retroflex view of the rectum demonstrated internal hemorrhoids.  No other abnormalities.  IMPRESSION: 1. functional constipation. 2. Sigmoid diverticulosis. 3. Internal hemorrhoids.  RECOMMENDATIONS: 1. Continue daily fiber supplementation. 2. Return to the care of Dr. Gertie Fey. DD:  02/15/00 TD:  02/16/00 Job: [id number]

## 2010-09-24 NOTE — Consult Note (Signed)
NAME:  [name], [name]                        ACCOUNT NO.:  [id number]   MEDICAL RECORD NO.:  [id number]                   PATIENT TYPE:  OBV   LOCATION:  [place]                                 FACILITY:  [facility]   PHYSICIAN:  Youlanda Mighty. Luisa Dago., M.D.          DATE OF BIRTH:  [birth year]   DATE OF CONSULTATION:  10/26/2002  DATE OF DISCHARGE:                                   CONSULTATION   REASON FOR CONSULTATION:  Detailed phone consultation on October 26, 2002 while  I was attending an emergency at the [facility] emergency room and at the [facility]  operating room followed by inpatient consultation on October 27, 2002.   CHIEF COMPLAINT:  Painful swollen right hand thumb index web space,  accompanied by fever.   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 On October 25, 2002 she began to experience spontaneous  swelling of her right thumb index web space with rubor, heat, and pain. She  presented for evaluation at the [facility] emergency room on the afternoon  of October 26, 2002 where she was evaluated by the emergency room staff. She  was noted to have a temperature of 100.1, pain, swelling and difficulty  flexing her thumb and index finger. She had noted some antecedent numbness  of her index finger.   A detailed history revealed that she had no antecedent injury and no  antecedent illness within the prior month. Specifically, no bacterial  illness, viral illness, open wounds, or known insect or other venomous  animal exposure.   An upper extremity orthopedic consult was requested and a detailed review of  her predicament was completed by phone as we were unable to attend in  person. Her laboratory studies were reviewed in detail. She had a white  count of 4600, a hemoglobin of 13.4 grams percent. Her platelet count was  241,000. Her absolute granulocyte count was  3200 at 70% and her eosinophil  count was 4%. Her uric acid was 4.6. A urinalysis was obtained and was noted  to be negative for protein and without signs of leukocyte esterase. A plain  x-ray of her hand was obtained which documented soft tissue swelling of her  thumb index web space. No calcification or signs of foreign body and no sign  of bony pathology such as tumor that could account for her swelling.   We suggested that she be placed on a broad spectrum antibiotic, for example  Unasyn, and that her primary care physicians evaluate her for possible sites  of metastatic seeding of infection. She was admitted by Dr. Ignacia Palma of  the [facility]. Her initial vital signs revealed an  elevated temperature. However, after beginning Unasyn, she noted that her  pain improved and her temperature decreased to approximately 97.6. A hand  surgery consult is now completed on the morning of October 27, 2002.   PAST MEDICAL HISTORY:  Reviewed in detail.   ALLERGIES:  No known drug allergies.   CURRENT MEDICATIONS:  Include Labetalol, Nexium, Diovan and Premarin.   SOCIAL HISTORY:  She quit smoking in 1994. She has an occasional alcoholic  beverage.   FAMILY HISTORY:  Positive for father having cancer of the ear, nose and  throat organs and background hypertension and diabetes mellitus.   REVIEW OF SYSTEMS:  A 14 point review of systems revealed a history of sinus  headaches.   PHYSICAL EXAMINATION:  GENERAL: An awake and alert, otherwise well appearing  65 year old woman.  VITAL SIGNS: Temperature 100.1 and now upon examination is 97.6. Blood  pressure 151/76. Pulse 55 and regular. Respirations 20.  HEENT: Normal.  HEART/LUNGS/ABDOMEN: Examination completed by Dr. Linna Darner.  ORTHOPEDIC: Examination at this time reveals a completely normal left arm  and both lower extremities. Inspection of her right hand reveals 3+ swelling  of her thumb index web space and a firmness  consistent with a possible  thenar space fluid collection. She does not have warmth or tenderness that  would suggest a thenar space abscess. She has full active range of motion of  her IP, MP and CMC joint of thumb and no pain on CMC grind. She has full  motion of her index, long and ring fingers. She has normal pulses and  capillary refill. Normal motor and sensory exam of the radial, ulnar, and  median distribution. There is no sign of lymphangitis or epitrochlear or  axillary lymphadenopathy.   RADIOLOGIC DATA:  X-rays of her hand are reviewed and reveal normal bony  anatomy. There is soft tissue swelling without calcification or visible  foreign body.   LABORATORY DATA:  Her followup laboratory studies at this time reveal that  her white count has actually decreased in the interval.   ASSESSMENT:  Rubor, pain, swelling right thumb index web space consistent  with cellulitis and/or early abscess formation. An alternative explanation  would be a metabolic disorder such as disabsorptive calcification, calcium  pyrophosphate deposition, or perhaps and atypical gout presentation. She  appears to be responding to Unasyn, therefore an infectious etiology is  likely. It is possible that this is a metastatic infection from a remote  source, such as her mouth flora.   RECOMMENDATIONS:  I have advised the medical service to continue the Unasyn  for 48-72 hours. She should maintain elevation of her hand and range of  motion exercises. I suspect that she will have a gradual decrease in her  swelling. At the present time, there appears to be no indication for further  imaging nor drainage. However, should her swelling increase, her pain  increase, or signs of lymphangitis develop, she may require incision and  drainage of her thumb index web space. Should her swelling persist another 48 hours, imaging with an MRI may provide further insight into this  pathologic process.                                                Youlanda Mighty Luisa Dago., M.D.    RVS/MEDQ  D:  10/27/2002  T:  10/27/2002  Job:  [id number]   cc:   Deborra Medina. Lenna Gilford, M.D. [facility]   Darrick Penna. Linna Darner, M.D. [facility]   Jeneen Rinks  Quin Hoop, M.D. [facility]

## 2010-09-24 NOTE — Discharge Summary (Signed)
NAME:  [name], [name]                        ACCOUNT NO.:  [id number]   MEDICAL RECORD NO.:  [id number]                   PATIENT TYPE:  INP   LOCATION:  [place]                                 FACILITY:  [facility]   PHYSICIAN:  Heinz Knuckles. Norins, M.D. [facility]         DATE OF BIRTH:  [birth year]   DATE OF ADMISSION:  10/26/2002  DATE OF DISCHARGE:  10/29/2002                                 DISCHARGE SUMMARY   ADMITTING DIAGNOSIS:  Cellulitis right hand.   DISCHARGE DIAGNOSIS:  Cellulitis right hand.   CONSULTANTS:  Youlanda Mighty. Sypher, M.D. for hand surgery.   PROCEDURE:  Hand x-ray performed June 19 revealed no acute fracture nor  trauma, soft tissue swelling was noted over the dorsum.  Carpal bones in  normal position.   HISTORY OF PRESENT ILLNESS:  The patient is a very pleasant 65 year old  woman who presented to the emergency department because of a 24-hour history  of swelling, redness, and pain in her right hand along with temperature.  In  the emergency department she was found to have a temperature of 101.  She  was thought to have significant cellulitis with systemic symptoms.  She was  subsequently admitted for IV antibiotics.   PAST MEDICAL HISTORY:  Well documented in the admit note.   FAMILY HISTORY:  Well documented in the admit note.   SOCIAL HISTORY:  Well documented in the admit note.   PHYSICAL EXAMINATION:  VITAL SIGNS:  Temperature 101.2, blood pressure  156/91.  EXTREMITIES:  Right hand was noted to have significant swelling in the inner  osseous area between the thumb and index finger of the right hand.  There  was no lymphadenopathy.   HOSPITAL COURSE:  The patient was admitted to hospital and started on IV  Unasyn.  She was seen in consultation by Youlanda Mighty. Sypher, M.D. who felt the  patient's presentation was consistent with infection which could be managed  with antibiotics alone and not require any surgical intervention as long as  the swelling continued  to improve.   The patient continued to improve.  She was afebrile for greater than 24  hours.  Swelling in her hand was markedly reduced.  With improvement noted  patient is felt to be stable and able to be discharged home to complete a  course of oral antibiotics.   PHYSICAL EXAMINATION:  VITAL SIGNS:  Temperature 96.9, blood pressure  142/71, pulse 58, respirations 18.  GENERAL:  Well-nourished black female lying in bed in no acute distress.  EXTREMITIES:  Right hand examination:  The patient's right hand is swollen,  particularly over the thenar eminence.  There is slight warmth to the touch.  The patient has good range of motion of her hand.  By her report the  swelling is markedly reduced.   DISPOSITION:  The patient is discharged home.   DISCHARGE MEDICATIONS:  She will continue on Augmentin 875 mg b.i.d. for  seven days.   FOLLOW UP:  She is instructed to call and make an appointment to see Biagio Borg, M.D. [facility] for follow-up in the office on Friday, June 25.   CONDITION ON DISCHARGE:  Stable and improved.                                               Heinz Knuckles Norins, M.D. [facility]    MEN/MEDQ  D:  10/29/2002  T:  10/29/2002  Job:  [id number]   cc:   Youlanda Mighty. Luisa Dago., M.D.  [address]  [place]  [place] [address]  Fax: [phone]   Biagio Borg, M.D. [facility]

## 2010-09-24 NOTE — Assessment & Plan Note (Signed)
[facility]                           GASTROENTEROLOGY OFFICE NOTE   NAME:[name], [name] [name]                     MRN:          [id number]  DATE:02/13/2006                            DOB:          1945/11/10    HISTORY:  This is a 65 year old female with a history of gastroesophageal  reflux disease complicated by peptic stricture, esophageal diverticulum, and  hiatal hernia.  Also a history of constipation, predominant irritable bowel  syndrome.  She was last evaluated in the office August 02, 2004.  See that  dictation for details.  She presents today with breakthrough reflux  symptoms.  She has been on Nexium 40 mg daily.  She was doing well until  about 3 or 4 months ago when she began to notice breakthrough heartburn with  regurgitation and associated chest discomfort.  This occurs approximately 2  or 3 times per month.  At other times she feels well.  No recurrent  dysphagia.  When symptoms do occur they have occurred after meals or late at  night.  No other complaints or issues.   CURRENT MEDICATIONS:  Include Premarin, Normodyne,  Diovan/Hydrochlorothiazide, and Nexium 40 mg.   PHYSICAL EXAM:  Well-appearing female in no acute distress.  Blood pressure 114/70, heart rate is 60, weight is 163.6 pounds.  HEENT:  Sclerae anicteric.  Oral mucosa intact.  Posterior pharynx is  normal.  No adenopathy.  LUNGS:  Clear.  HEART:  Regular.  ABDOMEN:  Soft without tenderness, mass, or hernia.   IMPRESSION:  Gastroesophageal reflux disease.  For the most part, the  patient is asymptomatic.  However, she does experience breakthrough symptoms  several times per month.  For this, I would like her to take an antacid  p.r.n.  Should symptoms become more frequent or antacids ineffective, then I  would consider increasing her Nexium to b.i.d. dosage.  She should continue  with strict adherence to reflux precautions.      ______________________________  Docia Chuck. Geri Seminole., MD      JNP/MedQ  DD:  02/13/2006  DT:  02/14/2006  Job #:  [id number]

## 2010-09-24 NOTE — H&P (Signed)
NAME:  [name], [name]                        ACCOUNT NO.:  [id number]   MEDICAL RECORD NO.:  [id number]                   PATIENT TYPE:  OBV   LOCATION:  0481                                 FACILITY:  [facility]   PHYSICIAN:  Darrick Penna. Linna Darner, M.D. [facility]         DATE OF BIRTH:  20-May-1945   DATE OF ADMISSION:  10/26/2002  DATE OF DISCHARGE:                                HISTORY & PHYSICAL   HISTORY OF PRESENT ILLNESS:  [name] is a 65 year old African  American female with hypertension, primary patient of Dr. [name], who  presents with less then 24 hours of hand pain, erythema, and temperature  elevation.   She noticed numbness in the right index finger on October 25, 2002, followed by  pain in the index finger and thumb.  Subsequent to that, she noted swelling  which has been progressive.  This morning her temperature was noted to be  101.  She was evaluated in [facility].  Dr. Theodis Sato was contacted, but  he was entering surgery and requested that she be evaluated by her primary  care team.  The emergency room PA felt uncomfortable giving her parenteral  antibiotics and having her wait until Monday to be seen.  These concerns  were related to the high fever and progressing edema, and absence of any  definite etiology.   PAST MEDICAL HISTORY:  1. Tonsillectomy and adenoidectomy.  2. Total abdominal hysterectomy and oophorectomy.  3. Bilateral cystectomy.  4. Gravida 2, para 2.  5. Diverticulosis was noted on colonoscopy in 2001.  6. She has been noted to have esophageal reflux for which she takes Nexium.   MEDICATIONS:  1. Diovan, questionable 160 mg daily.  2. Labetalol 200 mg, 1/2 b.i.d.  3. Premarin from Dr. Delene Loll.   ALLERGIES:  No known drug allergies.   HABITS:  She drinks socially, but does not smoke, having quit approximately  ten years ago.   FAMILY HISTORY:  Father had throat cancer.  Maternal aunt had hypertension  and diabetes.   REVIEW OF  SYSTEMS:  No etiology for any infection.  Her only symptom is a  minor sinus headache, symptoms which responded to ibuprofen.   PHYSICAL EXAMINATION:  GENERAL:  At this time, she is in no acute distress,  but has obvious swelling of the right upper extremity.  VITAL SIGNS:  Temperature 101.2, blood pressure initially 207/94,  subsequently found to be 156/91, pulse 88 regular, respiratory rate 18-20.  HEENT:  Arterial narrowing is noted on fundal examination.  She has some  increased cerumen noted in the canals.  Nares and oropharynx unremarkable.  She does have an upper dental plate.  NECK:  Thyroid is normal to palpation.  She has no carotid bruits.  S4 was  noted.  CHEST:  Clear to auscultation.  ABDOMEN:  No organomegaly or masses.  EXTREMITIES:  Pedal pulses are intact.  There is no edema.  She has full  range of motion of all extremities with the exception of the right hand  which is visibly swollen and erythematous.  The swelling is most traumatic  in the interosseous area between the thumb and the index finger.  LYMPHATICS:  There is no lymphadenopathy noted in the epitrochlear area,  right axillary area, and there is no evidence of lymphangitis of the right  forearm.   LABORATORY DATA:  Her film was negative by voice report.  White blood cell  count 4600 with normal differential.  Urinalysis negative.  Uric acid 4.   PLAN:  1. She will be admitted for observation with compresses, elevation, and IV     Unasyn.  Hand surgery consultation has been called by the emergency room     PA.  2. Hypertension will be monitored.  The patient may require an increase in     her labetalol.                                               Darrick Penna. Linna Darner, M.D. [facility]    WFH/MEDQ  D:  10/26/2002  T:  10/26/2002  Job:  [id number]   cc:   Youlanda Mighty. Luisa Dago., M.D.  [address]  [place]  [place] [address]  Fax: [phone]   Biagio Borg, M.D. [facility]   Barbaraann Rondo, M.D.  [address] [place]  [place]  [place]  [address]  Fax: [phone]

## 2010-09-24 NOTE — Op Note (Signed)
NAME:  [name], [name]                        ACCOUNT NO.:  [id number]   MEDICAL RECORD NO.:  [id number]                   PATIENT TYPE:  INP   LOCATION:  2550                                 FACILITY:  [facility]   PHYSICIAN:  Jessy Oto, M.D.                DATE OF BIRTH:  Feb 20, 1946   DATE OF PROCEDURE:  05/26/2003  DATE OF DISCHARGE:                                 OPERATIVE REPORT   PREOPERATIVE DIAGNOSES:  1. 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  2. Right carpal tunnel syndrome, mild.  Patient has EMGs and nerve     conduction studies indicating chronic motor units in the right upper     extremity in the C6 distribution, mild carpal tunnel findings     representing a double crush syndrome.   POSTOPERATIVE DIAGNOSES:  1. 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  2. Right carpal tunnel syndrome, mild.  Patient has EMGs and nerve     conduction studies indicating chronic motor units in the right upper     extremity in the C6 distribution, mild carpal tunnel findings     representing a double crush syndrome.   OPERATION PERFORMED:  1. Anterior cervical diskectomy and fusion at C4-5 and C5-6 with right C5     and C6 nerve root foraminal decompression, right iliac crest bone graft     harvest used for the fusion portion of the case with internal fixation     with DePuy six hole 41 mm length locking cervical plate and four 13 mm     screws, fixing the C4 and C6 levels and two 12 mm screws fixing the C5     level.  2. Right open carpal tunnel release.   SURGEON:  Jessy Oto, M.D.   ASSISTANT:  Epimenio Foot, P.A.   ANESTHESIA:  General orotracheal anesthesia.   ANESTHESIOLOGIST:  1. Dr. Glennon Mac.  2. Dr. Tobias Alexander.   ESTIMATED BLOOD LOSS:  50 mL.   DRAINS:  1. TLS 10 French, left neck.  2. Foley catheter to straight drain.   INDICATIONS  FOR PROCEDURE:  The patient is a 65 year old female who has been  followed for some time with problems of cervical pain, radiation into her  upper extremities.  She has been experiencing discomfort into her neck with  radiation into her arms that has been ongoing for several years.  Pain into  her neck is worsening.  Numbness into the right thumb and index finger.  She  has undergone attempts at conservative management with MRI studies  demonstrating multiple segments of mild cervical stenosis, the worst being  at C4-5 and C5-6 associated with an 8 mm spinal canal and foraminal  entrapment on the right side involving both the right C5 and C6  roots.  Very  mild foraminal narrowing noted at C6-7.  The patient's EMGs and nerve  conduction studies indicating moderate carpal tunnel syndrome and chronic  motor units in the C6 distribution indicating cervical radiculopathy.  With  failure of conservative management, the patient is brought to the operating  room to undergo anterior diskectomy and fusion of both C4-5 and C5-6 with  decompression of right C5 and C6 nerve roots as well as right carpal tunnel  release for problem with double crush syndrome.   INTRAOPERATIVE FINDINGS:  The patient was found to have significant right C5  and C6 foraminal entrapment.  Both nerve roots showing erythema and swelling  following their decompression.  Compression due to uncovertebral spurs  pressing on the nerve roots as well as some mild amount of disk material  noted into the neuroforamen on the right side at C6 affecting the right C6  nerve root within the entry port in the neural foramen.  Right carpal tunnel  syndrome felt to be present.  The patient had very severe compression of the  nerve root secondary to hypertrophic changes involving the transverse carpal  ligament, a palmaris brevis muscle contributing here.  Release performed out  to the superficial arch and palm and release performed proximally  about 4 to  6 cm proximal to the wrist crease within the forearm fascia area.   DESCRIPTION OF PROCEDURE:  After adequate general anesthesia, the patient in  a beach chair position, a bump under the right buttock to elevate the right  iliac crest, the neck in slight extension, with [place]field well-padded  horseshoe with 5 pound cervical halter traction.  Standard prep over the  anterior neck and left neck and right iliac crest with DuraPrep solution and  standard preoperative antibiotics and standard drape.  Iodine Vi-drape was  used.  The incision in line with the patient's skin crease about two  fingerbreadths above the medial border of the clavicle.  This was at the  level of the patient's thyroid cartilage at about the C5 level.  Incision  approximately 3 to 3-1/2 inches in length through the skin and subcutaneous  layers down to the platysma layer.  This was then incised in line with the  skin incision using electrocautery, then bluntly spread using Metzenbaum  scissors in the interval between the trachea and esophagus medially and  carotid sheath laterally then developed.  Some large veins identified  representing thyroid veins and arteries.  These were then ligated  individually after clamping and dividing.  These were able to be retracted  safely without bleeding.  The prevertebral fascia level then identified.  Blunt dissection with finger used to identify the anterior aspect of the  cervical spine.  Retracting the trachea and esophagus with hand held  Cloward's, the medial border of the longus colli, prevertebral fascia area  was cauterized using hook electrocautery.  Kitner dissector used to tease  the prevertebral fascia across the midline.  Spinal needle was placed first  at the C5-6 and C6-7 level.  This ascertained by C-arm to be incorrect lower  level so that the lower needle was removed under direct observation and placed into the C4-5 disk space and observed under C-arm  fluoroscopy to be  the correct levels at C4-5 and C5-6.   A small portion of each of each of the anterior disks was then excised with  removal of the needles individually.  Note that the needles had their  sheaths maintained with the exception  of a small portion of the needle  extending beyond the tip of the sheath 1 cm.  Removing a small portion of  the disk over the anterior annulus of C4-5 and C5-6 disk space was marked  for remainder of the case.  The border of the longus colli muscle then  carefully freed up using electrocautery as well as Key elevation.  McCullough retractor then inserted with the foot of the blade of the  retractor beneath the border of the longus colli muscle at the C5-6 level.  The anterior aspect of the disk space easily identified and well exposed.  14 mm screw posts were then inserted into the vertebral bodies, central  portion of the vertebral body anteriorly at C5 and C6.  Distraction obtained  across the disk space.  A 3 mm Kerrison used to resect the anterior lip  osteophytes.  15 blade scalpel used to further incise the disk out to the  anterolateral aspect of the disk.  A high speed bur then used to carefully  remove both anterior lip osteophytes as well as the inferior end plate of C5  and superior end plate of C6 back to the posterior aspect of the disk and  the posterior annulus.  The operating room microscope draped and brought  into the field and under operating room microscope, the posterior aspect of  the disk space was decompressed first by using the high speed bur to further  thin the posterior lip osteophytes anterior and introducing a 1 mm Kerrison  into the neural foramen on the right side, resecting bone, then resecting  bony bridge and bars over the posterior aspect of the disk space over the  superior aspect of the inferior lip of C5 and inferior to the posterior  superior lip of C6 resecting the annulus as well as posterior longitudinal   ligament.  This then being completed then uncovertebral joint was resected  on the right side and out into the right neural foramen until the right C6  nerve root was felt to be exiting freely and changing its course to a more  anterior approach.  At least 50% of the anterior to posterior diameter of  the uncovertebral joint was resected.  Disk material was found to be present  over the superior aspect of the uncovertebral joint impinging on the  shoulder of the C6 nerve root and this was resected using pituitary rongeurs  as well as 2 mm Kerrison's.  This being completed, then end plates carefully  were curetted to bleeding surfaces.  The height of the intervertebral disk  space measured using a sounder to a 7 mm sounder provided by DePuy.  Right  iliac crest bone graft was then harvested through a separate incision  approximately 3 inches in length over the right anterior lateral iliac crest.  Incision through skin and subcu layers directly down to the  anterolateral iliac crest.  Incision made in the interval between the  fascial layers of the abdomen and the proximal thigh and Cobb  used to  assist in obtaining subperiosteal dissection over the superior aspect of the  iliac crest and over the lateral aspect.  Retractors inserted using the Cobb  retractors as well as [employer].  Then a 7 mm dual oscillating saw blade was  used to cut tricortical iliac crest.  This was divided across the base using  a quarter inch curved osteotome to protect the medial structures.  The depth  of the intervertebral disk space was  measured using Cloward depth gauge  measuring approximately 17 mm.  A 14 mm graft was chosen.  This graft was  then tapered to the dimensions of the intervertebral disk space.  Following  irrigation and hemostasis in the intervertebral disk space using thrombin  soaked Gelfoam, the graft was placed over the intervertebral disk space,  impacted into place. A small amount of graft  protruding.  With the  osteophytes on the anterior aspect of the disk space in C5-6.  Screw posts  then removed from the vertebral body of C6.  Bone wax applied to bleeding  screw post hole.  The screw post then placed into the vertebral body of C4  following replacement of the Cloward self-retaining retractor beneath the  medial border of the longus colli muscle at the C4-5 level.  At this level,  excellent exposure.  Screw post placed into the C4 vertebral body.  Distraction obtained across the disk space.  A high speed bur then used to  carefully open the channel for the disk space.  The disk space was extremely  narrow.  The inferior end plate of C4 and superior end plate of C5 back to  the posterior aspect of the disk, posterior lip osteophytes.  Further  distraction obtained, the operating microscope brought into the field.  Under direct visualization, the posterior lip osteophytes resected using 1  mm Kerrison over the superior aspect of the osteophyte at C4 anteriorly and  posteriorly and over the inferior aspect to the osteophyte posteriorly and  superiorly at C5.  The uncovertebral joint on the right side was found to be  quite hypertrophic.  Note that 3-0 and 2-0 forward straight microcurets were  used to further debride osteophytes that were evident and present.  Posterior annular disk material as well as posterior longitudinal ligament  were resected.  The right uncovertebral joint was resected and this was  dissected approximately 50% of the AP diameter of the uncovertebral joint  lateral aspect of the vertebral body on the right side.  Decompressing the  right C5 nerve root.  It was found to have significant spurs evident  compressing the right C5 nerve root.  Following decompression, the nerve  appeared to be erythematous and swollen.  Consistent with post decompression  erythema and edema.  With irrigation then, end plates carefully decorticated using curets.  The height of  the intervertebral disk space measured again at  7 mm compared with that of C5-6.  Additional bone graft was harvested from  the right iliac crest using the dual oscillating saw protecting the medial  and lateral structures. This was carefully tapered to the dimensions of the  intervertebral disk space, 14 mm depth.  A depth at this level measured also  with a Cloward depth gauge at 17 mm.  The height of the intervertebral disk  space was measured at 7, the depth of the graft 14 mm.  This graft after  being tapered to the dimensions mentioned, was then impacted into place  carefully to be sure there was no soft tissue present that could be  retropulsed with insertion of the graft at this level as this was done also  as the previous level.  Following insertion of the graft, then screw posts  were removed at C4 and C5.  Bone wax applied to bleeding screw post holes.  Soft tissues carefully retracted with removal of the screws.  High speed bur  was then used to carefully remove anterior lip osteophytes.  The the  anterior portion of the tricortical graft inserted into both C4-5 and C5-6  disk spaces until this area was smoothed to accept a cervical plate.  Additional prevertebral fascial layer was debrided using electrocautery as  well as bur to smooth the anterior aspect of the cervical spine for  acceptance of a plate and annealing of the plate closely to the cervical  spine.  Once this was completed, irrigation was performed.  The plate chosen  was a 41 mm plate.  Cervical traction was released.  Plate placed against  the anterior aspect of the cervical spine, pinned into place using temporary  fixation pins.  Screw was placed first on the left side of the plate,  starting at C4, 13 mm screw was placed.  At C5, a 12 mm screw was placed and  at C6 on the left side, a 13 mm screw was placed and seemed to fix the plate  quite nicely to the anterior cervical spine.  The fixation pins were  removed  and then further screws were then placed on the right side, 13 mm at C6,  12  mm at C5 and 13 mm at C4.  Following placement of the plates and screws,  then the screws were each individually tightened and then the locking nut  rotated into place using the flat screw driver divided torque wrench.  C-arm  fluoroscopy brought into the field.  Under direct visualization, AP and  lateral images were obtained documenting plates to be in excellent position,  alignment and fixation of screws within the vertebral bodies of C5, C4 and  C6.  Bone plugs were seen to be in good position and alignment without  evidence of retropulsion of bone graft material.  Screws all appeared to be  in excellent condition.  Following irrigation, inspection of the esophagus  demonstrated no abnormalities.  The soft tissues showed no active bleeding  evident.  A 10 French TLS drain was placed in the neck exiting inferior to the anterior incision. This was sewn in place with a 4-0 nylon stitch.  This  was then placed along the left side of the plate neck.  The platysma layer  then reapproximated with interrupted 3-0 Vicryl sutures.  Deep subcutaneous  layers approximated with interrupted sutures and skin closed with 4-0  subcuticular stitch.  The right iliac crest bone graft harvest site was  irritated with copious amounts of irrigant solution.  Bone wax was applied  to the bleeding cancellous bone surfaces and then Gelfoam.  The fascial  layers in the abdomen approximated to that of the proximal thigh with  interrupted #1 Vicryl sutures.  Deep subcu layers approximated with  interrupted #1 and 0 Vicryl sutures.  The more superficial layers with  interrupted 2-0 Vicryl sutures and the skin closed with a running  subcutaneous stitch of 4-0 Vicryl.  Tincture of benzoin and Steri-Strips  applied here.  4 x 4s fixed to the skin with Hypafix tape.  Neck area 4 x 4s  were fixed to the skin with Hypafix tape.   Philadelphia collar applied.  The  patient was then brought into supine position.  All pressure points observed  to be well padded.  The right upper extremity was then placed a nonsterile  tourniquet about the right upper arm.  The right hand was prepped from the  finger tips to the right elbow with DuraPrep solution.  The patient was  given an additional gram of Ancef, draped the right  upper extremity in the  usual manner following prep with DuraPrep.  The right upper extremity was  then placed on the hand table.  Exsanguination of the right upper extremity  with Esmarch bandage, tourniquet inflated right upper arm to 250 mmHg.  The  incision based along the ulnar aspect of the thenar eminence crease  approximately 2 inches in length along the ulnar aspect of the thenar crease  and curved across the wrist creases diagonally toward the ulnar size of the  wrist.  Incision through skin and subcutaneous layers directly down to the  superficial fascial layer of the distal forearm.  The palmaris longus tendon  identified.  This was retracted radial-ward.  The incision carried deeper in  line with the ring finger digit.  The fascial layer overlying the median  nerve then incised using a Stevens scissors and the fascia then divided  proximally from the distal forearm incision.  Lifting the skin with Ragnell  retractors and double end retractors.  Freer elevator then placed beneath  the incision into the fascial layer, the distal forearm protecting the  median nerve beneath and then 15 blade scalpel used to incise the fascia  over the distal portion of the forearm, the transverse carpal ligament  overlying the Soil scientist.  The palmaris brevis tendon identified, muscle  identified overlying the carpal tunnel.  The palmaris longus tendon  preserved.  The palmar fascia was further incised out to the transverse  carpal ligament.  The deep fibers of the transverse carpal ligament noted to be  pressing severely upon the median nerve as it crossed beneath this area.  Following resection of the transverse carpal ligament and the fascia out to  the superficial palmar arch, evaluation of the nerve demonstrated the motor  branch to be in good condition over the thenar side of the incision.  Nerve  and flexor tendons carefully retracted.  There appeared to be very little  signs of any synovitis evident and no mass within the carpal tunnel noted.  Irrigation was performed.  The skin closed by approximating skin edges with  horizontal mattress sutures of 4-0 nylon.  Single vertical mattress suture  was placed at the very proximal end of the incision scar.  Adaptic, 4 x 4s  fixed to the skin with sterile Webril.  A well padded volar splint with the  wrist in dorsiflexion 25 to 30 degrees was then applied.  Extending from the  metacarpophalangeal joints to approximately six inches proximal to the wrist  crease.  Tourniquet was released.  Total tourniquet time of 20 minutes.  The  patient was then reactivated, extubated and returned to recovery room in  satisfactory condition.  Sponge and instrument counts were correct.                                               Jessy Oto, M.D.    Liz Malady  D:  05/26/2003  T:  05/27/2003  Job:  [id number]

## 2010-12-06 ENCOUNTER — Ambulatory Visit (INDEPENDENT_AMBULATORY_CARE_PROVIDER_SITE_OTHER)
Admission: RE | Admit: 2010-12-06 | Discharge: 2010-12-06 | Disposition: A | Discharge status: Home / Self Care | Payer: Medicare Other | Source: Ambulatory Visit | Attending: Internal Medicine | Admitting: Internal Medicine

## 2010-12-06 DIAGNOSIS — R918 Other nonspecific abnormal finding of lung field: Secondary | ICD-10-CM

## 2010-12-09 ENCOUNTER — Telehealth: Payer: Self-pay | Admitting: Internal Medicine

## 2010-12-09 NOTE — Telephone Encounter (Signed)
Ct result reviweed. NO change since April 2010. If she is well and not losing weight, she can see me next year with fu Ct chest wihtou contrast as recommended by radiologist

## 2010-12-09 NOTE — Telephone Encounter (Signed)
Spoke with pt. She is requesting results of chest CT done 12/06/10- I advised MR out of the office until tomorrow, and will address this then. Please advise, thanks!

## 2010-12-10 NOTE — Telephone Encounter (Signed)
Pt aware of Dr Golden Pop result. She will f/u in 1 year or sooner if problems

## 2011-02-07 LAB — DIFFERENTIAL
Basophils Absolute: 0
Lymphocytes Relative: 16
Lymphs Abs: 0.7
Neutro Abs: 3.2

## 2011-02-07 LAB — CBC
HCT: 40.1
Platelets: 231
RDW: 12.7
WBC: 4.6

## 2011-02-09 ENCOUNTER — Other Ambulatory Visit: Payer: Self-pay | Admitting: Obstetrics and Gynecology

## 2011-02-09 DIAGNOSIS — N644 Mastodynia: Secondary | ICD-10-CM

## 2011-02-22 ENCOUNTER — Ambulatory Visit
Admission: RE | Admit: 2011-02-22 | Discharge: 2011-02-22 | Disposition: A | Discharge status: Home / Self Care | Payer: Medicare Other | Source: Ambulatory Visit | Attending: Obstetrics and Gynecology | Admitting: Obstetrics and Gynecology

## 2011-02-22 DIAGNOSIS — N644 Mastodynia: Secondary | ICD-10-CM

## 2011-03-10 ENCOUNTER — Other Ambulatory Visit: Payer: Self-pay | Admitting: Internal Medicine

## 2011-05-16 ENCOUNTER — Other Ambulatory Visit: Payer: Self-pay | Admitting: Internal Medicine

## 2011-05-17 ENCOUNTER — Other Ambulatory Visit (INDEPENDENT_AMBULATORY_CARE_PROVIDER_SITE_OTHER): Payer: Medicare Other

## 2011-05-17 ENCOUNTER — Ambulatory Visit (INDEPENDENT_AMBULATORY_CARE_PROVIDER_SITE_OTHER): Payer: Medicare Other | Admitting: Internal Medicine

## 2011-05-17 ENCOUNTER — Ambulatory Visit (INDEPENDENT_AMBULATORY_CARE_PROVIDER_SITE_OTHER)
Admission: RE | Admit: 2011-05-17 | Discharge: 2011-05-17 | Disposition: A | Discharge status: Home / Self Care | Payer: Medicare Other | Source: Ambulatory Visit | Attending: Internal Medicine | Admitting: Internal Medicine

## 2011-05-17 ENCOUNTER — Other Ambulatory Visit: Payer: Self-pay | Admitting: Internal Medicine

## 2011-05-17 ENCOUNTER — Encounter: Payer: Self-pay | Admitting: Internal Medicine

## 2011-05-17 VITALS — BP 122/70 | HR 66 | Temp 98.3°F | Ht 63.0 in | Wt 134.2 lb

## 2011-05-17 DIAGNOSIS — Z0000 Encounter for general adult medical examination without abnormal findings: Secondary | ICD-10-CM

## 2011-05-17 DIAGNOSIS — Z79899 Other long term (current) drug therapy: Secondary | ICD-10-CM

## 2011-05-17 DIAGNOSIS — J209 Acute bronchitis, unspecified: Secondary | ICD-10-CM

## 2011-05-17 LAB — HEPATIC FUNCTION PANEL
AST: 20 U/L (ref 0–37)
Albumin: 3.9 g/dL (ref 3.5–5.2)
Alkaline Phosphatase: 83 U/L (ref 39–117)
Bilirubin, Direct: 0 mg/dL (ref 0.0–0.3)
Total Protein: 7.6 g/dL (ref 6.0–8.3)

## 2011-05-17 LAB — CBC WITH DIFFERENTIAL/PLATELET
Basophils Absolute: 0 10*3/uL (ref 0.0–0.1)
Eosinophils Absolute: 0.3 10*3/uL (ref 0.0–0.7)
HCT: 36.7 % (ref 36.0–46.0)
Hemoglobin: 12.6 g/dL (ref 12.0–15.0)
Lymphs Abs: 1.3 10*3/uL (ref 0.7–4.0)
MCHC: 34.3 g/dL (ref 30.0–36.0)
Monocytes Relative: 9.7 % (ref 3.0–12.0)
Neutro Abs: 1.5 10*3/uL (ref 1.4–7.7)
Platelets: 201 10*3/uL (ref 150.0–400.0)
RDW: 13.4 % (ref 11.5–14.6)

## 2011-05-17 LAB — BASIC METABOLIC PANEL
BUN: 16 mg/dL (ref 6–23)
Calcium: 8.6 mg/dL (ref 8.4–10.5)
Chloride: 97 mEq/L (ref 96–112)
Creatinine, Ser: 0.9 mg/dL (ref 0.4–1.2)
GFR: 85.62 mL/min (ref >60.00)

## 2011-05-17 LAB — URINALYSIS, ROUTINE W REFLEX MICROSCOPIC
Bilirubin Urine: NEGATIVE
Hgb urine dipstick: NEGATIVE
Ketones, ur: NEGATIVE
Leukocytes, UA: NEGATIVE
Urine Glucose: NEGATIVE
Urobilinogen, UA: 0.2 (ref 0.0–1.0)

## 2011-05-17 LAB — LIPID PANEL
Cholesterol: 252 mg/dL — ABNORMAL HIGH (ref 0–200)
HDL: 92 mg/dL (ref >39.00)
Total CHOL/HDL Ratio: 3
Triglycerides: 77 mg/dL (ref 0.0–149.0)
VLDL: 15.4 mg/dL (ref 0.0–40.0)

## 2011-05-17 LAB — TSH: TSH: 1.21 u[IU]/mL (ref 0.35–5.50)

## 2011-05-17 MED ORDER — CITALOPRAM HYDROBROMIDE 10 MG PO TABS: 10.0000 mg | ORAL_TABLET | Freq: Every day | ORAL | Status: DC

## 2011-05-17 MED ORDER — AMLODIPINE BESYLATE 10 MG PO TABS: 10.0000 mg | ORAL_TABLET | Freq: Every day | ORAL | Status: DC

## 2011-05-17 MED ORDER — ASPIRIN EC 81 MG PO TBEC: 81.0000 mg | DELAYED_RELEASE_TABLET | Freq: Every day | ORAL | Status: DC

## 2011-05-17 MED ORDER — HYDROCHLOROTHIAZIDE 12.5 MG PO CAPS: 12.5000 mg | ORAL_CAPSULE | Freq: Every day | ORAL | Status: DC

## 2011-05-17 MED ORDER — AZITHROMYCIN 250 MG PO TABS: ORAL_TABLET | ORAL | Status: AC

## 2011-05-17 MED ORDER — HYDROCODONE-HOMATROPINE 5-1.5 MG/5ML PO SYRP: 5.0000 mL | ORAL_SOLUTION | Freq: Four times a day (QID) | ORAL | Status: AC | PRN

## 2011-05-17 NOTE — Assessment & Plan Note (Addendum)
/  Mild to mod, for antibx course,  to f/u any worsening symptoms or concerns, pt also requests cxr due to pulm nodule in the past, and ro pna

## 2011-05-17 NOTE — Progress Notes (Signed)
  Subjective:    Patient ID: [name], female    DOB: [birth year], 66 y.o.   MRN: [id number]  HPI  Here for wellness and f/u;  Overall doing ok;  Pt denies CP, worsening SOB, DOE, wheezing, orthopnea, PND, worsening LE edema, palpitations, dizziness or syncope.  Pt denies neurological change such as new Headache, facial or extremity weakness.  Pt denies polydipsia, polyuria, or low sugar symptoms. Pt states overall good compliance with treatment and medications, good tolerability, and trying to follow lower cholesterol diet.  Pt denies worsening depressive symptoms, suicidal ideation or panic. No fever, wt loss, night sweats, loss of appetite, or other constitutional symptoms.  Pt states good ability with ADL's, low fall risk, home safety reviewed and adequate, no significant changes in hearing or vision, and occasionally active with exercise.  Also - Here with acute onset mild to mod 2-3 days ST, HA, general weakness and malaise, with prod cough greenish sputum.  Due for colonscopy, med refills, routine labs   Review of Systems     Objective:   Physical Exam        Assessment & Plan:

## 2011-05-17 NOTE — Assessment & Plan Note (Signed)
Overall doing well, age appropriate education and counseling updated, referrals for preventative services and immunizations addressed, dietary and smoking counseling addressed, most recent labs and ECG reviewed.  I have personally reviewed and have noted: 1) the patient's medical and social history 2) The pt's use of alcohol, tobacco, and illicit drugs 3) The patient's current medications and supplements 4) Functional ability including ADL's, fall risk, home safety risk, hearing and visual impairment 5) Diet and physical activities 6) Evidence for depression or mood disorder 7) The patient's height, weight, and BMI have been recorded in the chart I have made referrals, and provided counseling and education based on review of the above Declines immunizations, for colonscopy as she is due

## 2011-05-17 NOTE — Patient Instructions (Addendum)
Take all new medications as prescribed Continue all other medications as before Your refills were sent to the pharmacy today Please start the Aspirin at 81 mg - 1 per day - COATED only, to help lower risk of heart attack and stroke Please go to LAB in the Basement for the blood and/or urine tests to be done today Please go to XRAY in the Basement for the x-ray test Please call the phone number [phone] (the [facility]) for results of testing in 2-3 days;  When calling, simply dial the number, and when prompted enter the MRN number above (the Medical Record Number) and the # key, then the message should start. You will be contacted regarding the referral for: colonoscopy Please return in 1 year for your yearly visit, or sooner if needed, with Lab testing done 3-5 days before

## 2011-05-18 ENCOUNTER — Other Ambulatory Visit: Payer: Self-pay | Admitting: Internal Medicine

## 2011-05-18 MED ORDER — POTASSIUM CHLORIDE ER 10 MEQ PO TBCR: 10.0000 meq | EXTENDED_RELEASE_TABLET | Freq: Every day | ORAL | Status: DC

## 2011-06-15 ENCOUNTER — Encounter: Payer: Medicare Other | Admitting: Internal Medicine

## 2011-07-28 ENCOUNTER — Encounter: Payer: Self-pay | Admitting: Gastroenterology

## 2012-01-23 ENCOUNTER — Ambulatory Visit (INDEPENDENT_AMBULATORY_CARE_PROVIDER_SITE_OTHER): Payer: Medicare Other | Admitting: Internal Medicine

## 2012-01-23 ENCOUNTER — Encounter: Payer: Self-pay | Admitting: Internal Medicine

## 2012-01-23 ENCOUNTER — Telehealth: Payer: Self-pay | Admitting: Internal Medicine

## 2012-01-23 VITALS — BP 120/66 | HR 61 | Temp 98.1°F | Resp 15 | Wt 147.1 lb

## 2012-01-23 DIAGNOSIS — L02419 Cutaneous abscess of limb, unspecified: Secondary | ICD-10-CM

## 2012-01-23 DIAGNOSIS — L03116 Cellulitis of left lower limb: Secondary | ICD-10-CM

## 2012-01-23 DIAGNOSIS — L03119 Cellulitis of unspecified part of limb: Secondary | ICD-10-CM

## 2012-01-23 DIAGNOSIS — I10 Essential (primary) hypertension: Secondary | ICD-10-CM

## 2012-01-23 DIAGNOSIS — E785 Hyperlipidemia, unspecified: Secondary | ICD-10-CM

## 2012-01-23 MED ORDER — SULFAMETHOXAZOLE-TRIMETHOPRIM 800-160 MG PO TABS: 1.0000 | ORAL_TABLET | Freq: Two times a day (BID) | ORAL | Status: DC

## 2012-01-23 NOTE — Telephone Encounter (Signed)
Caller: Aubryanna/Patient; Patient Name: [name]; PCP: [name] (Adults only); Best Callback Phone Number: [phone].  Onset 01/22/12 with fever and swelling on lower leg.  Pt states swelling is painful and warm to touch. Pt rates pain as 5/10.  Triaged patient per Leg Non-Injury Protocol.  See Provider within 4 hours Disposition for 'New Marked swelling (twice the normal size as compared to usual appearance)'.  Home care advice given and appt scheduled for 4:00 with Dr Asa Lente (no appts available with Dr Jenny Reichmann).

## 2012-01-23 NOTE — Progress Notes (Signed)
Subjective:    Patient ID: [name], female    DOB: [birth year], 66 y.o.   MRN: [id number]  HPI  Complains of pain in left leg Located on anterior medial side Denies precipitating injury Associated with redness and tenderness to touch No shortness of breath or chest pain No personal history of clot Pain improved with ibuprofen No history of same symptoms  Past Medical History  Diagnosis Date  . Hypertension     Review of Systems  Constitutional: Positive for fever (low grade x 48h). Negative for fatigue and unexpected weight change.  Musculoskeletal: Negative for myalgias and gait problem.  Skin: Positive for color change. Negative for rash and wound.       Objective:   Physical Exam BP 120/66  Pulse 61  Temp 98.1 F (36.7 C) (Oral)  Resp 15  Wt 147 lb 2 oz (66.735 kg)  SpO2 97% Wt Readings from Last 3 Encounters:  01/23/12 147 lb 2 oz (66.735 kg)  05/17/11 134 lb 4 oz (60.895 kg)  06/17/10 127 lb (57.607 kg)   Constitutional: She appears well-developed and well-nourished. No distress.  nontoxic  Neck: Normal range of motion. Neck supple. No JVD present. No thyromegaly present.  Cardiovascular: Normal rate, regular rhythm and normal heart sounds.  No murmur heard. No BLE edema. Pulmonary/Chest: Effort normal and breath sounds normal. No respiratory distress. She has no wheezes.  Musculoskeletal: Normal range of motion, no joint effusions. No gross deformities Skin: Cellulitis changes with erythema tenderness and warmth over anterior left shin. No particular wound or abrasion noted   Psychiatric: She has a normal mood and affect. Her behavior is normal. Judgment and thought content normal.   Lab Results  Component Value Date   WBC 3.6* 05/17/2011   HGB 12.6 05/17/2011   HCT 36.7 05/17/2011   PLT 201.0 05/17/2011   GLUCOSE 89 05/17/2011   CHOL 252* 05/17/2011   TRIG 77.0 05/17/2011   HDL 92.00 05/17/2011   LDLDIRECT 130.0 05/17/2011   LDLCALC  Value: 77        Total  Cholesterol/HDL:CHD Risk Coronary Heart Disease Risk Table                     Men   Women  1/2 Average Risk   3.4   3.3  Average Risk       5.0   4.4  2 X Average Risk   9.6   7.1  3 X Average Risk  23.4   11.0        Use the calculated Patient Ratio above and the CHD Risk Table to determine the patient's CHD Risk.        ATP III CLASSIFICATION (LDL):  <100     mg/dL   Optimal  100-129  mg/dL   Near or Above                    Optimal  130-159  mg/dL   Borderline  160-189  mg/dL   High  >190     mg/dL   Very High 03/11/2009   ALT 12 05/17/2011   AST 20 05/17/2011   NA 138 05/17/2011   K 3.0* 05/17/2011   CL 97 05/17/2011   CREATININE 0.9 05/17/2011   BUN 16 05/17/2011   CO2 35* 05/17/2011   TSH 1.21 05/17/2011   INR 0.96 03/09/2009   HGBA1C 5.8 02/02/2010   MICROALBUR 0.6 02/02/2010        Assessment &  Plan:   LLE cellulitis - treat with antibiotics  symptomatic care with ibuprofen and tylenol + elevation education provided

## 2012-01-23 NOTE — Assessment & Plan Note (Signed)
BP Readings from Last 3 Encounters:  01/23/12 120/66  05/17/11 122/70  06/17/10 108/58   The current medical regimen is effective;  continue present plan and medications.

## 2012-01-23 NOTE — Patient Instructions (Signed)
It was good to see you today. Septra antibiotics for your infection - Your prescription(s) have been submitted to your pharmacy. Please take as directed and contact our office if you believe you are having problem(s) with the medication(s). Alternate between ibuprofen and tylenol for aches, pain and fever symptoms as discussed Cellulitis Cellulitis is an infection of the skin and the tissue beneath it. The area is typically red and tender. It is caused by germs (bacteria) (usually staph or strep) that enter the body through cuts or sores. Cellulitis most commonly occurs in the arms or lower legs.   HOME CARE INSTRUCTIONS    If you are given a prescription for medications which kill germs (antibiotics), take as directed until finished.   If the infection is on the arm or leg, keep the limb elevated as able.   Use a warm cloth several times per day to relieve pain and encourage healing.   See your caregiver for recheck of the infected site as directed if problems arise.   Only take over-the-counter or prescription medicines for pain, discomfort, or fever as directed by your caregiver.  SEEK MEDICAL CARE IF:    The area of redness (inflammation) is spreading, there are red streaks coming from the infected site, or if a part of the infection begins to turn dark in color.   The joint or bone underneath the infected skin becomes painful after the skin has healed.   The infection returns in the same or another area after it seems to have gone away.   A boil or bump swells up. This may be an abscess.   New, unexplained problems such as pain or fever develop.  SEEK IMMEDIATE MEDICAL CARE IF:    You have a fever.   You or your child feels drowsy or lethargic.   There is vomiting, diarrhea, or lasting discomfort or feeling ill (malaise) with muscle aches and pains.  MAKE SURE YOU:    Understand these instructions.   Will watch your condition.   Will get help right away if you are not  doing well or get worse.  Document Released: 02/02/2005 Document Revised: 04/14/2011 Document Reviewed: 12/12/2007 [facility] Patient Information 2012 [place].

## 2012-01-23 NOTE — Assessment & Plan Note (Signed)
Reviewed last labs - no on meds -  Check annually and  follow up PCP as planned

## 2012-02-22 LAB — HM MAMMOGRAPHY

## 2012-02-24 ENCOUNTER — Other Ambulatory Visit: Payer: Self-pay | Admitting: Obstetrics and Gynecology

## 2012-02-24 DIAGNOSIS — Z1231 Encounter for screening mammogram for malignant neoplasm of breast: Secondary | ICD-10-CM

## 2012-04-03 ENCOUNTER — Ambulatory Visit
Admission: RE | Admit: 2012-04-03 | Discharge: 2012-04-03 | Disposition: A | Discharge status: Home / Self Care | Payer: Medicare Other | Source: Ambulatory Visit | Attending: Obstetrics and Gynecology | Admitting: Obstetrics and Gynecology

## 2012-04-03 DIAGNOSIS — Z1231 Encounter for screening mammogram for malignant neoplasm of breast: Secondary | ICD-10-CM

## 2012-05-23 ENCOUNTER — Ambulatory Visit (INDEPENDENT_AMBULATORY_CARE_PROVIDER_SITE_OTHER): Payer: Medicare Other | Admitting: Internal Medicine

## 2012-05-23 ENCOUNTER — Telehealth: Payer: Self-pay | Admitting: Internal Medicine

## 2012-05-23 ENCOUNTER — Other Ambulatory Visit (INDEPENDENT_AMBULATORY_CARE_PROVIDER_SITE_OTHER): Payer: Medicare Other

## 2012-05-23 ENCOUNTER — Encounter: Payer: Self-pay | Admitting: Internal Medicine

## 2012-05-23 VITALS — BP 120/70 | HR 60 | Temp 98.2°F | Ht 63.0 in | Wt 150.0 lb

## 2012-05-23 DIAGNOSIS — E785 Hyperlipidemia, unspecified: Secondary | ICD-10-CM

## 2012-05-23 DIAGNOSIS — J9601 Acute respiratory failure with hypoxia: Secondary | ICD-10-CM | POA: Diagnosis present

## 2012-05-23 DIAGNOSIS — Z0000 Encounter for general adult medical examination without abnormal findings: Secondary | ICD-10-CM

## 2012-05-23 DIAGNOSIS — F341 Dysthymic disorder: Secondary | ICD-10-CM

## 2012-05-23 DIAGNOSIS — J449 Chronic obstructive pulmonary disease, unspecified: Secondary | ICD-10-CM

## 2012-05-23 DIAGNOSIS — Z23 Encounter for immunization: Secondary | ICD-10-CM

## 2012-05-23 DIAGNOSIS — I10 Essential (primary) hypertension: Secondary | ICD-10-CM

## 2012-05-23 HISTORY — DX: Chronic obstructive pulmonary disease, unspecified: J44.9

## 2012-05-23 HISTORY — DX: Hyperlipidemia, unspecified: E78.5

## 2012-05-23 LAB — URINALYSIS, ROUTINE W REFLEX MICROSCOPIC
Hgb urine dipstick: NEGATIVE
Nitrite: NEGATIVE
Specific Gravity, Urine: >=1.03 (ref 1.000–1.030)
Total Protein, Urine: NEGATIVE
pH: 6 (ref 5.0–8.0)

## 2012-05-23 LAB — BASIC METABOLIC PANEL
BUN: 14 mg/dL (ref 6–23)
Chloride: 101 mEq/L (ref 96–112)
Glucose, Bld: 91 mg/dL (ref 70–99)
Potassium: 3.6 mEq/L (ref 3.5–5.1)

## 2012-05-23 LAB — CBC WITH DIFFERENTIAL/PLATELET
Eosinophils Relative: 8.4 % — ABNORMAL HIGH (ref 0.0–5.0)
Monocytes Absolute: 0.4 10*3/uL (ref 0.1–1.0)
Monocytes Relative: 11.9 % (ref 3.0–12.0)
Neutrophils Relative %: 48.3 % (ref 43.0–77.0)
Platelets: 220 10*3/uL (ref 150.0–400.0)
WBC: 3.5 10*3/uL — ABNORMAL LOW (ref 4.5–10.5)

## 2012-05-23 LAB — HEPATIC FUNCTION PANEL
ALT: 18 U/L (ref 0–35)
AST: 24 U/L (ref 0–37)
Albumin: 3.8 g/dL (ref 3.5–5.2)
Total Protein: 7.7 g/dL (ref 6.0–8.3)

## 2012-05-23 LAB — TSH: TSH: 1.79 u[IU]/mL (ref 0.35–5.50)

## 2012-05-23 LAB — LDL CHOLESTEROL, DIRECT: Direct LDL: 130.4 mg/dL

## 2012-05-23 LAB — LIPID PANEL: HDL: 76.9 mg/dL (ref >39.00)

## 2012-05-23 MED ORDER — CITALOPRAM HYDROBROMIDE 10 MG PO TABS: 10.0000 mg | ORAL_TABLET | Freq: Every day | ORAL | Status: DC

## 2012-05-23 MED ORDER — AMLODIPINE BESYLATE 10 MG PO TABS: 10.0000 mg | ORAL_TABLET | Freq: Every day | ORAL | Status: DC

## 2012-05-23 MED ORDER — HYDROCHLOROTHIAZIDE 12.5 MG PO CAPS: 12.5000 mg | ORAL_CAPSULE | Freq: Every day | ORAL | Status: DC

## 2012-05-23 MED ORDER — IBUPROFEN 400 MG PO TABS: ORAL_TABLET | ORAL | Status: DC

## 2012-05-23 NOTE — Assessment & Plan Note (Signed)
stable overall by history and exam, recent data reviewed with pt, and pt to continue medical treatment as before,  to f/u any worsening symptoms or concerns BP Readings from Last 3 Encounters:  05/23/12 120/70  01/23/12 120/66  05/17/11 122/70

## 2012-05-23 NOTE — Telephone Encounter (Signed)
Patient informed. 

## 2012-05-23 NOTE — Assessment & Plan Note (Signed)
Overall doing well, age appropriate education and counseling updated, referrals for preventative services and immunizations addressed, dietary and smoking counseling addressed, most recent labs reviewed.  I have personally reviewed and have noted: 1) the patient's medical and social history 2) The pt's use of alcohol, tobacco, and illicit drugs 3) The patient's current medications and supplements 4) Functional ability including ADL's, fall risk, home safety risk, hearing and visual impairment 5) Diet and physical activities 6) Evidence for depression or mood disorder 7) The patient's height, weight, and BMI have been recorded in the chart I have made referrals, and provided counseling and education based on review of the above ECG reviewed as per emr, also due for colonoscopy f/u

## 2012-05-23 NOTE — Progress Notes (Signed)
Subjective:    Patient ID: [name], female    DOB: 07/14/1945, 67 y.o.   MRN: [id number]  HPI  Here for wellness and f/u;  Overall doing ok;  Pt denies CP, worsening SOB, DOE, wheezing, orthopnea, PND, worsening LE edema, palpitations, dizziness or syncope.  Pt denies neurological change such as new headache, facial or extremity weakness.  Pt denies polydipsia, polyuria, or low sugar symptoms. Pt states overall good compliance with treatment and medications, good tolerability, and has been trying to follow lower cholesterol diet.  Pt denies worsening depressive symptoms, suicidal ideation or panic. No fever, night sweats, wt loss, loss of appetite, or other constitutional symptoms.  Pt states good ability with ADL's, has low fall risk, home safety reviewed and adequate, no other significant changes in hearing or vision, and only occasionally active with exercise.  Had some bone loss with recent dxa, as well as up to date with pap/mammogram with GYN Current Outpatient Prescriptions on File Prior to Visit  Medication Sig Dispense Refill  . amLODipine (NORVASC) 10 MG tablet Take 1 tablet (10 mg total) by mouth daily.  90 tablet  3  . citalopram (CELEXA) 10 MG tablet Take 1 tablet (10 mg total) by mouth daily.  90 tablet  3  . hydrochlorothiazide (MICROZIDE) 12.5 MG capsule Take 1 capsule (12.5 mg total) by mouth daily.  90 capsule  3  . aspirin EC 81 MG tablet Take 1 tablet (81 mg total) by mouth daily.  100 tablet  11  . potassium chloride (KLOR-CON 10) 10 MEQ tablet Take 1 tablet (10 mEq total) by mouth daily.  90 tablet  3   Review of Systems  Constitutional: Negative for diaphoresis, activity change, appetite change or unexpected weight change.  HENT: Negative for hearing loss, ear pain, facial swelling, mouth sores and neck stiffness.   Eyes: Negative for pain, redness and visual disturbance.  Respiratory: Negative for shortness of breath and wheezing.   Cardiovascular: Negative for  chest pain and palpitations.  Gastrointestinal: Negative for diarrhea, blood in stool, abdominal distention or other pain Genitourinary: Negative for hematuria, flank pain or change in urine volume.  Musculoskeletal: Negative for myalgias and joint swelling.  Skin: Negative for color change and wound.  Neurological: Negative for syncope and numbness. other than noted Hematological: Negative for adenopathy.  Psychiatric/Behavioral: Negative for hallucinations, self-injury, decreased concentration and agitation.      Objective:   Physical Exam BP 120/70  Pulse 60  Temp 98.2 F (36.8 C) (Oral)  Ht 5\' 3"  (1.6 m)  Wt 150 lb (68.04 kg)  BMI 26.57 kg/m2  SpO2 98% VS noted,  Constitutional: Pt is oriented to person, place, and time. Appears well-developed and well-nourished.  Head: Normocephalic and atraumatic.  Right Ear: External ear normal.  Left Ear: External ear normal.  Nose: Nose normal.  Mouth/Throat: Oropharynx is clear and moist.  Eyes: Conjunctivae and EOM are normal. Pupils are equal, round, and reactive to light.  Neck: Normal range of motion. Neck supple. No JVD present. No tracheal deviation present.  Cardiovascular: Normal rate, regular rhythm, normal heart sounds and intact distal pulses.   Pulmonary/Chest: Effort normal and breath sounds normal.  Abdominal: Soft. Bowel sounds are normal. There is no tenderness. No HSM  Musculoskeletal: Normal range of motion. Exhibits no edema.  Lymphadenopathy:  Has no cervical adenopathy.  Neurological: Pt is alert and oriented to person, place, and time. Pt has normal reflexes. No cranial nerve deficit.  Skin: Skin is warm  and dry. No rash noted.  Psychiatric:  Has  normal mood and affect. Behavior is normal.      Assessment & Plan:

## 2012-05-23 NOTE — Assessment & Plan Note (Signed)
stable overall by history and exam, recent data reviewed with pt, and pt to continue medical treatment as before,  to f/u any worsening symptoms or concerns SpO2 Readings from Last 3 Encounters:  05/23/12 98%  01/23/12 97%  05/17/11 92%

## 2012-05-23 NOTE — Telephone Encounter (Signed)
Message copied by Biagio Borg on Wed May 23, 2012 12:02 PM ------      Message from: Sharon Seller B      Created: Wed May 23, 2012 11:38 AM       The patient would like a prescription sent in to the pharmacy for motrin for shoulder pain.

## 2012-05-23 NOTE — Assessment & Plan Note (Signed)
stable overall by history and exam, recent data reviewed with pt, and pt to continue medical treatment as before,  to f/u any worsening symptoms or concerns, to cont low chol diet Lab Results  Component Value Date   LDLCALC  Value: 31        Total Cholesterol/HDL:CHD Risk Coronary Heart Disease Risk Table                     Men   Women  1/2 Average Risk   3.4   3.3  Average Risk       5.0   4.4  2 X Average Risk   9.6   7.1  3 X Average Risk  23.4   11.0        Use the calculated Patient Ratio above and the CHD Risk Table to determine the patient's CHD Risk.        ATP III CLASSIFICATION (LDL):  <100     mg/dL   Optimal  100-129  mg/dL   Near or Above                    Optimal  130-159  mg/dL   Borderline  160-189  mg/dL   High  >190     mg/dL   Very High 03/11/2009

## 2012-05-23 NOTE — Telephone Encounter (Signed)
Done erx 

## 2012-05-23 NOTE — Assessment & Plan Note (Signed)
stable overall by history and exam, recent data reviewed with pt, and pt to continue medical treatment as before,  to f/u any worsening symptoms or concerns Lab Results  Component Value Date   WBC 3.6* 05/17/2011   HGB 12.6 05/17/2011   HCT 36.7 05/17/2011   PLT 201.0 05/17/2011   GLUCOSE 89 05/17/2011   CHOL 252* 05/17/2011   TRIG 77.0 05/17/2011   HDL 92.00 05/17/2011   LDLDIRECT 130.0 05/17/2011   LDLCALC  Value: 77        Total Cholesterol/HDL:CHD Risk Coronary Heart Disease Risk Table                     Men   Women  1/2 Average Risk   3.4   3.3  Average Risk       5.0   4.4  2 X Average Risk   9.6   7.1  3 X Average Risk  23.4   11.0        Use the calculated Patient Ratio above and the CHD Risk Table to determine the patient's CHD Risk.        ATP III CLASSIFICATION (LDL):  <100     mg/dL   Optimal  100-129  mg/dL   Near or Above                    Optimal  130-159  mg/dL   Borderline  160-189  mg/dL   High  >190     mg/dL   Very High 03/11/2009   ALT 12 05/17/2011   AST 20 05/17/2011   NA 138 05/17/2011   K 3.0* 05/17/2011   CL 97 05/17/2011   CREATININE 0.9 05/17/2011   BUN 16 05/17/2011   CO2 35* 05/17/2011   TSH 1.21 05/17/2011   INR 0.96 03/09/2009   HGBA1C 5.8 02/02/2010   MICROALBUR 0.6 02/02/2010

## 2012-05-23 NOTE — Patient Instructions (Addendum)
You had the pneumonia shot today Please consider having the flu shot at your local pharmacy, as we are out today Please continue all other medications as before, and refills have been done if requested. Please have the pharmacy call with any other refills you may need. Your EKG was ok today Please go to the LAB in the Basement (turn left off the elevator) for the tests to be done today You will be contacted by phone if any changes need to be made immediately.  Otherwise, you will receive a letter about your results with an explanation, but please check with MyChart first. Thank you for enrolling in [facility]. Please follow the instructions below to securely access your online medical record. MyChart allows you to send messages to your doctor, view your test results, renew your prescriptions, schedule appointments, and more. To Log into My Chart online, please go by [employer] or [employer] to [email or web address], or download the MyChart App from the [employer].  Your Username is: [phone] (pass sade) Please send a practice email message on Mychart later today. You will be contacted regarding the referral for: Colonoscopy with Dr [name] Please contact Dr Golden Pop office to see if you need further CT scans for the chest Please return in 1 year for your yearly visit, or sooner if needed, with Lab testing done 3-5 days before

## 2012-05-29 ENCOUNTER — Encounter: Payer: Self-pay | Admitting: Internal Medicine

## 2012-06-18 ENCOUNTER — Emergency Department (HOSPITAL_COMMUNITY): Payer: Medicare Other

## 2012-06-18 ENCOUNTER — Observation Stay (HOSPITAL_COMMUNITY)
Admission: EM | Admit: 2012-06-18 | Discharge: 2012-06-19 | Disposition: A | Discharge status: Home / Self Care | Payer: Medicare Other | Attending: Internal Medicine | Admitting: Internal Medicine

## 2012-06-18 ENCOUNTER — Telehealth: Payer: Self-pay | Admitting: Internal Medicine

## 2012-06-18 ENCOUNTER — Encounter (HOSPITAL_COMMUNITY): Payer: Self-pay | Admitting: Emergency Medicine

## 2012-06-18 DIAGNOSIS — R9439 Abnormal result of other cardiovascular function study: Secondary | ICD-10-CM

## 2012-06-18 DIAGNOSIS — R634 Abnormal weight loss: Secondary | ICD-10-CM

## 2012-06-18 DIAGNOSIS — Z0000 Encounter for general adult medical examination without abnormal findings: Secondary | ICD-10-CM

## 2012-06-18 DIAGNOSIS — R079 Chest pain, unspecified: Secondary | ICD-10-CM

## 2012-06-18 DIAGNOSIS — J984 Other disorders of lung: Secondary | ICD-10-CM

## 2012-06-18 DIAGNOSIS — I10 Essential (primary) hypertension: Secondary | ICD-10-CM

## 2012-06-18 DIAGNOSIS — K589 Irritable bowel syndrome without diarrhea: Secondary | ICD-10-CM

## 2012-06-18 DIAGNOSIS — F341 Dysthymic disorder: Secondary | ICD-10-CM

## 2012-06-18 DIAGNOSIS — T7840XA Allergy, unspecified, initial encounter: Secondary | ICD-10-CM

## 2012-06-18 DIAGNOSIS — D649 Anemia, unspecified: Secondary | ICD-10-CM

## 2012-06-18 DIAGNOSIS — J449 Chronic obstructive pulmonary disease, unspecified: Secondary | ICD-10-CM

## 2012-06-18 DIAGNOSIS — E785 Hyperlipidemia, unspecified: Secondary | ICD-10-CM

## 2012-06-18 DIAGNOSIS — R109 Unspecified abdominal pain: Secondary | ICD-10-CM

## 2012-06-18 DIAGNOSIS — D72819 Decreased white blood cell count, unspecified: Secondary | ICD-10-CM

## 2012-06-18 DIAGNOSIS — E876 Hypokalemia: Secondary | ICD-10-CM

## 2012-06-18 DIAGNOSIS — Z23 Encounter for immunization: Secondary | ICD-10-CM | POA: Insufficient documentation

## 2012-06-18 DIAGNOSIS — K5730 Diverticulosis of large intestine without perforation or abscess without bleeding: Secondary | ICD-10-CM

## 2012-06-18 DIAGNOSIS — IMO0002 Reserved for concepts with insufficient information to code with codable children: Secondary | ICD-10-CM

## 2012-06-18 DIAGNOSIS — M899 Disorder of bone, unspecified: Secondary | ICD-10-CM

## 2012-06-18 DIAGNOSIS — R911 Solitary pulmonary nodule: Secondary | ICD-10-CM | POA: Insufficient documentation

## 2012-06-18 DIAGNOSIS — M8580 Other specified disorders of bone density and structure, unspecified site: Secondary | ICD-10-CM

## 2012-06-18 DIAGNOSIS — J4489 Other specified chronic obstructive pulmonary disease: Secondary | ICD-10-CM | POA: Insufficient documentation

## 2012-06-18 DIAGNOSIS — F411 Generalized anxiety disorder: Secondary | ICD-10-CM

## 2012-06-18 DIAGNOSIS — M549 Dorsalgia, unspecified: Secondary | ICD-10-CM

## 2012-06-18 DIAGNOSIS — R42 Dizziness and giddiness: Secondary | ICD-10-CM | POA: Insufficient documentation

## 2012-06-18 DIAGNOSIS — K219 Gastro-esophageal reflux disease without esophagitis: Secondary | ICD-10-CM

## 2012-06-18 DIAGNOSIS — J309 Allergic rhinitis, unspecified: Secondary | ICD-10-CM

## 2012-06-18 DIAGNOSIS — R6881 Early satiety: Secondary | ICD-10-CM

## 2012-06-18 HISTORY — DX: Chest pain, unspecified: R07.9

## 2012-06-18 HISTORY — DX: Hyperlipidemia, unspecified: E78.5

## 2012-06-18 LAB — CBC WITH DIFFERENTIAL/PLATELET
Eosinophils Absolute: 0.3 10*3/uL (ref 0.0–0.7)
Eosinophils Relative: 10 % — ABNORMAL HIGH (ref 0–5)
HCT: 36.3 % (ref 36.0–46.0)
Hemoglobin: 11.7 g/dL — ABNORMAL LOW (ref 12.0–15.0)
Lymphs Abs: 1.1 10*3/uL (ref 0.7–4.0)
MCH: 30.6 pg (ref 26.0–34.0)
MCV: 95 fL (ref 78.0–100.0)
Monocytes Absolute: 0.2 10*3/uL (ref 0.1–1.0)
Monocytes Relative: 6 % (ref 3–12)
Platelets: 232 10*3/uL (ref 150–400)
RBC: 3.82 MIL/uL — ABNORMAL LOW (ref 3.87–5.11)

## 2012-06-18 LAB — BASIC METABOLIC PANEL
BUN: 12 mg/dL (ref 6–23)
Calcium: 9.3 mg/dL (ref 8.4–10.5)
Creatinine, Ser: 0.85 mg/dL (ref 0.50–1.10)
GFR calc non Af Amer: 69 mL/min — ABNORMAL LOW (ref >90)
Glucose, Bld: 94 mg/dL (ref 70–99)

## 2012-06-18 LAB — D-DIMER, QUANTITATIVE: D-Dimer, Quant: 0.29 ug/mL-FEU (ref 0.00–0.48)

## 2012-06-18 MED ORDER — HYDROCHLOROTHIAZIDE 12.5 MG PO CAPS
12.5000 mg | ORAL_CAPSULE | Freq: Every day | ORAL | Status: DC
  Filled 2012-06-18: qty 1

## 2012-06-18 MED ORDER — ASPIRIN EC 325 MG PO TBEC
325.0000 mg | DELAYED_RELEASE_TABLET | Freq: Every day | ORAL | Status: DC
  Administered 2012-06-19: 325 mg via ORAL
  Filled 2012-06-18: qty 1

## 2012-06-18 MED ORDER — ACETAMINOPHEN 325 MG PO TABS
650.0000 mg | ORAL_TABLET | Freq: Four times a day (QID) | ORAL | Status: DC | PRN
  Administered 2012-06-19 (×2): 650 mg via ORAL
  Filled 2012-06-18 (×2): qty 2

## 2012-06-18 MED ORDER — ESTRADIOL 1 MG PO TABS
0.5000 mg | ORAL_TABLET | Freq: Every day | ORAL | Status: DC
  Administered 2012-06-19: 0.5 mg via ORAL
  Filled 2012-06-18: qty 0.5

## 2012-06-18 MED ORDER — MORPHINE SULFATE 2 MG/ML IJ SOLN: 1.0000 mg | INTRAMUSCULAR | Status: DC | PRN

## 2012-06-18 MED ORDER — ONDANSETRON HCL 4 MG/2ML IJ SOLN: 4.0000 mg | Freq: Four times a day (QID) | INTRAMUSCULAR | Status: DC | PRN

## 2012-06-18 MED ORDER — ONDANSETRON HCL 4 MG PO TABS: 4.0000 mg | ORAL_TABLET | Freq: Four times a day (QID) | ORAL | Status: DC | PRN

## 2012-06-18 MED ORDER — ASPIRIN 81 MG PO CHEW
324.0000 mg | CHEWABLE_TABLET | Freq: Once | ORAL | Status: AC
  Administered 2012-06-18: 324 mg via ORAL
  Filled 2012-06-18: qty 4

## 2012-06-18 MED ORDER — CITALOPRAM HYDROBROMIDE 10 MG PO TABS
10.0000 mg | ORAL_TABLET | Freq: Every day | ORAL | Status: DC
  Administered 2012-06-19: 10 mg via ORAL
  Filled 2012-06-18: qty 1

## 2012-06-18 MED ORDER — NITROGLYCERIN 2 % TD OINT
0.5000 [in_us] | TOPICAL_OINTMENT | Freq: Once | TRANSDERMAL | Status: AC
  Administered 2012-06-18: 0.5 [in_us] via TOPICAL
  Filled 2012-06-18: qty 1

## 2012-06-18 MED ORDER — AMLODIPINE BESYLATE 10 MG PO TABS
10.0000 mg | ORAL_TABLET | Freq: Every day | ORAL | Status: DC
  Administered 2012-06-18: 10 mg via ORAL
  Filled 2012-06-18 (×2): qty 1

## 2012-06-18 MED ORDER — ENOXAPARIN SODIUM 40 MG/0.4ML ~~LOC~~ SOLN
40.0000 mg | SUBCUTANEOUS | Status: DC
  Administered 2012-06-18: 40 mg via SUBCUTANEOUS
  Filled 2012-06-18 (×2): qty 0.4

## 2012-06-18 MED ORDER — NITROGLYCERIN 2 % TD OINT
0.5000 [in_us] | TOPICAL_OINTMENT | Freq: Four times a day (QID) | TRANSDERMAL | Status: DC
  Administered 2012-06-18 – 2012-06-19 (×3): 0.5 [in_us] via TOPICAL
  Filled 2012-06-18: qty 30

## 2012-06-18 MED ORDER — ACETAMINOPHEN 650 MG RE SUPP: 650.0000 mg | Freq: Four times a day (QID) | RECTAL | Status: DC | PRN

## 2012-06-18 MED ORDER — ESTRADIOL 1 MG PO TABS: 0.5000 mg | ORAL_TABLET | Freq: Every day | ORAL | Status: DC

## 2012-06-18 MED ORDER — INFLUENZA VIRUS VACC SPLIT PF IM SUSP
0.5000 mL | INTRAMUSCULAR | Status: AC
  Administered 2012-06-19: 0.5 mL via INTRAMUSCULAR
  Filled 2012-06-18: qty 0.5

## 2012-06-18 MED ORDER — SODIUM CHLORIDE 0.9 % IJ SOLN
3.0000 mL | Freq: Two times a day (BID) | INTRAMUSCULAR | Status: DC
  Administered 2012-06-18: 3 mL via INTRAVENOUS

## 2012-06-18 MED ORDER — OXYCODONE HCL 5 MG PO TABS: 5.0000 mg | ORAL_TABLET | ORAL | Status: DC | PRN

## 2012-06-18 MED ORDER — POTASSIUM CHLORIDE CRYS ER 20 MEQ PO TBCR
40.0000 meq | EXTENDED_RELEASE_TABLET | Freq: Once | ORAL | Status: AC
  Administered 2012-06-18: 40 meq via ORAL
  Filled 2012-06-18: qty 2

## 2012-06-18 NOTE — Telephone Encounter (Signed)
Returned call from [place] however upon chart review noted that patient was previously triaged and sent to the emergency room.

## 2012-06-18 NOTE — Consult Note (Signed)
CARDIOLOGY CONSULT NOTE   Patient ID: [name] MRN: [id number] DOB/AGE: Sep 21, 1945 67 y.o.  Admit date: 06/18/2012  Primary Physician   [name] Cower, MD Primary Cardiologist   DM Reason for Consultation   Chest pain  [id number] [name] is a 67 y.o. female with no history of CAD. She has done well since evaluation for chest pain in 2010.  She developed chest pain, starting 4 days ago. Pressure, on the right side of her chest and going to her back. 10/10 at first, 6/10 later, not a 0/10 since it started. Seemed to be helped a little by Ibuprofen, but would not resolve. No change with deep inspiration or cough. Chest wall not tender to palpation. Has not been sick with cough/cold symptoms. Has bradycardia at times with HR in the 50s, no symptoms from that. Came to the hospital today because she was tired of the pain. Has rec'd ASA 324 MG and NTG paste, chest pain currently a 4/10.    Past Medical History  Diagnosis Date  . Hypertension   . COPD (chronic obstructive pulmonary disease) 05/23/2012  . Hyperlipidemia 05/23/2012    T. Chol 234, LDL 130     Past Surgical History  Procedure Laterality Date  . Lung biopsy      left side, not cancerous  . Cervical spine surgery    . Vaginal hysterectomy      Allergies  Allergen Reactions  . Ace Inhibitors     REACTION: angioedema  . Tetanus Toxoid     I have reviewed the patient's current medications  Medication Sig  amLODipine (NORVASC) 10 MG tablet Take 1 tablet (10 mg total) by mouth daily.  aspirin EC 81 MG tablet Take 81 mg by mouth daily.  citalopram (CELEXA) 10 MG tablet Take 1 tablet (10 mg total) by mouth daily.  hydrochlorothiazide (MICROZIDE) 12.5 MG capsule Take 1 capsule (12.5 mg total) by mouth daily.  ibuprofen (ADVIL,MOTRIN) 400 MG tablet Take 400 mg by mouth every 8 (eight) hours as needed. 1-2 tabs by mouth every 6 hrs as needed for pain  aspirin EC 81 MG tablet Take 1 tablet (81 mg total) by mouth daily.    estradiol (ESTRACE) 0.5 MG tablet Take 0.5 mg by mouth daily.  potassium chloride (KLOR-CON 10) 10 MEQ tablet Take 1 tablet (10 mEq total) by mouth daily.     History   Social History  . Marital Status: Single    Spouse Name: N/A    Number of Children: N/A  . Years of Education: N/A   Occupational History  . Retired Chartered certified accountant    Social History Main Topics  . Smoking status: Former Smoker - quit many years ago  . Smokeless tobacco: Not on file  . Alcohol Use: Yes  . Drug Use: No  . Sexually Active: Not on file   Other Topics Concern  . Not on file   Social History Narrative  . Lives alone, no problems.     Family History  Problem Relation Age of Onset  . Throat cancer Father   . Hypertension Sister      ROS: Feels she eats a low cholesterol diet. She is active around the house but has not been exercising due to the weather. Full 14 point review of systems complete and found to be negative unless listed above.  Physical Exam: Blood pressure 112/64, pulse 58, temperature 98.5 F (36.9 C), temperature source Oral, resp. rate 14, SpO2 100.00%.  General: Well developed,  well nourished, female in no acute distress Head: Eyes PERRLA, No xanthomas.   Normocephalic and atraumatic, oropharynx without edema or exudate. Dentition: good Lungs: Clear to auscultation bilaterally  Heart: HRRR S1 S2, no rub/gallop, no murmur. pulses are 2+ all 4 extrem.   Neck: No carotid bruits. No lymphadenopathy.  JVD not elevated. Abdomen: Bowel sounds present, abdomen soft and non-tender without masses or hernias noted. Msk:  No spine or cva tenderness. No weakness, no joint deformities or effusions. Extremities: No clubbing or cyanosis. No edema.  Neuro: Alert and oriented X 3. No focal deficits noted. Psych:  Good affect, responds appropriately Skin: No rashes or lesions noted.  Labs:   Lab Results  Component Value Date   WBC 3.1* 06/18/2012   HGB 11.7* 06/18/2012   HCT 36.3 06/18/2012    MCV 95.0 06/18/2012   PLT 232 06/18/2012   No results found for this basename: INR,  in the last 72 hours   Recent Labs Lab 06/18/12 1318  NA 139  K 3.2*  CL 98  CO2 32  BUN 12  CREATININE 0.85  CALCIUM 9.3  GLUCOSE 94    No results found for this basename: CKTOTAL, CKMB, TROPONINI,  in the last 72 hours  Recent Labs  06/18/12 1343  TROPIPOC 0.00   Lab Results  Component Value Date   CHOL 234* 05/23/2012   HDL 76.90 05/23/2012   LDLCALC  Value: 130    ATP III CLASSIFICATION (LDL):  <100     mg/dL   Optimal  100-129  mg/dL   Near or Above Optimal  130-159  mg/dL   Borderline  160-189  mg/dL   High  >190     mg/dL   Very High  05/23/2012   TRIG 64.0 05/23/2012    Lexiscan Myoview: 03/25/2009 ECG Impression: No significant ST segment change suggestive of ischemia.  Overall Impression: Partially reversible mid to apical anterior perfusion defect. This could be breast attenuation but given partial reversibility, it is difficult to rule out ischemia.  Overall Impression Comments: Normal overall LV systolic function and wall motion.  Appended Document: Cardiology Nuclear Study  LMOPT - stress test is "equivocal" - - could be somewhat suggestive of problem with lack of blood flow for part of the heart muscle  Cardiac CT January 2011 Coronary CT angiogram showed no coronary disease and calcium score of 0.   Echo: 03/11/2009 Conclusions 1. Left ventricle: The cavity size was normal. Wall thickness was normal. The estimated ejection fraction was 60%. 2. Left atrium: The atrium was mildly dilated. 3. Atrial septum: No defect or patent foramen ovale was identified. 4. Pulmonary arteries: PA peak pressure: 10mm Hg (S).  ECG: 18-Jun-2012 13:11:24 [facility]/ED ROUTINE RECORD Normal sinus rhythm Nonspecific T wave abnormality Abnormal ECG 25mm/s 61mm/mV 100Hz  8.0.1 12SL 241 HD CID: 3 Referred by: Unconfirmed Vent. rate 63 BPM PR interval 164 ms QRS duration  72 ms QT/QTc 432/442 ms P-R-T axes 74 41 -3  Radiology:  Dg Chest 2 View 06/18/2012  *RADIOLOGY REPORT*  Clinical Data: Chest pain  CHEST - 2 VIEW  Comparison: 05/17/2011 and multiple previous  Findings: Heart size is normal.  Extensive bilateral pulmonary scarring remains evident.  Previous pulmonary resection and on the left.  No sign of active infiltrate, mass, effusion or collapse. Chronic spinal curvature.  IMPRESSION: Extensive chronic scarring.  Postoperative changes on the left.  No identifiably acute pathology.   Original Report Authenticated By: Nelson Chimes, M.D.  ASSESSMENT AND PLAN:   The patient was seen today by Dr Mare Ferrari, the patient evaluated and the data reviewed.   Principal Problem:   Chest pain at rest - agree with admit, r/o MI, can do a stress echo in am as pt feels she can walk well enough, will order. Plan cath if enzymes become elevated but she has had continuous pain x 4 days without ST elevation or initial enzyme elevation so likely will rule out.  Otherwise, per primary MD Active Problems:   HYPERLIPIDEMIA   HYPERTENSION   PULMONARY NODULE   GERD   COPD (chronic obstructive pulmonary disease)   Hypokalemia   Anemia   Signed: Rosaria Ferries 06/18/2012, 4:47 PM Co-Sign MD The patient was seen in the emergency room with Rosaria Ferries PAC.  Her history is atypical in that the chest pain has been continuously present for the past 5 days.  It did seem to be helped a little bit by some ibuprofen that she took at home.  Her in the emergency room it also appeared to be helped after a nitroglycerin patch was applied and she was given aspirin.  Her electrocardiogram is nonacute.  Her physical examination is unremarkable.  She does not have any chest wall tenderness.  She is not dyspneic and is in no acute distress.  The lungs are clear and the heart reveals no murmur gallop rub or click.  The abdomen is soft and nontender and the extremities show good peripheral  pulses and no phlebitis or edema. Our plan will be to rule out with serial enzymes.  A previous Myoview in 2010 was equivocal because of possible breast attenuation.  Subsequent cardiac CT angiogram at that time in January 2011 showed no coronary disease and her calcium score was 0.  If she rules out overnight we will plan a stress echocardiogram in the a.m. Will follow with you.

## 2012-06-18 NOTE — H&P (Signed)
[facility] History and Physical  NYERA RESA [id number] DOB: 09/15/1945 DOA: 06/18/2012  Referring physician: Dr. [name], EDP PCP: [name], MD  OP Specialists: None  Chief Complaint: Chest pain.  HPI: [name] is a 67 y.o. female with PMH of HTN, HL, lung nodule-completed workup and discharged by pulmonologist presents to ED on 2/10 with 4 days history of chest pain. 4 days ago,patient woke up and experienced first episode of mid sternal pressure-like chest pain with radiation to back, right upper extremity and right side of neck. She felt lightheaded but denies dyspnea, pleuritic component to the pain or palpitations. She laid back in bed for 15 minutes and the chest pain resolved spontaneously. Since then patient has had intermittent similar chest pain but has progressively stayed longer. She denies any association with food intake but at times it increases with deep breaths but is not sure. Denies history of recent travel, trauma or lifting anything heavy. Since this morning, she noticed the chest pain has been more constant and severe 6/10 and presented to the emergency department. She denies fever, cough, chills or rigors. Prior to this episode, she has not had any chest pain since 2010 when she was evaluated with CT chest-negative for PE or dissection, Myoview that showed a partially reversible anterior defect due to breast attenuation versus CAD. She did follow up with cardiology and had a coronary CT angiogram which showed no coronary disease and a calcium score of 0. The hospitalist service is requested to admit for further evaluation and management  Review of Systems: all systems reviewed and apart from history of presenting illness, are negative  Past Medical History  Diagnosis Date  . Hypertension   . COPD (chronic obstructive pulmonary disease) 05/23/2012  . Hyperlipidemia 05/23/2012    T. Chol 234, LDL 130    Past Surgical History  Procedure  Laterality Date  . Lung biopsy      left side, not cancerous  . Cervical spine surgery    . Vaginal hysterectomy     Social History:  reports that she has quit smoking. She does not have any smokeless tobacco history on file. She reports that  drinks alcohol. She reports that she does not use illicit drugs. Patient is single. Lives alone and is independent of activities of daily living. Quit smoking 20+ years ago. Occasionally drinks a glass of wine.  Allergies  Allergen Reactions  . Ace Inhibitors     REACTION: angioedema  . Tetanus Toxoid     Family History  Problem Relation Age of Onset  . Throat cancer Father   . Hypertension Sister     Prior to Admission medications   Medication Sig Start Date End Date Taking? Authorizing Provider  amLODipine (NORVASC) 10 MG tablet Take 1 tablet (10 mg total) by mouth daily. 05/23/12  Yes Biagio Borg, MD  aspirin EC 81 MG tablet Take 81 mg by mouth daily.   Yes Historical Provider, MD  citalopram (CELEXA) 10 MG tablet Take 1 tablet (10 mg total) by mouth daily. 05/23/12  Yes Biagio Borg, MD  hydrochlorothiazide (MICROZIDE) 12.5 MG capsule Take 1 capsule (12.5 mg total) by mouth daily. 05/23/12  Yes Biagio Borg, MD  ibuprofen (ADVIL,MOTRIN) 400 MG tablet Take 400 mg by mouth every 8 (eight) hours as needed. 1-2 tabs by mouth every 6 hrs as needed for pain 05/23/12  Yes Biagio Borg, MD  aspirin EC 81 MG tablet Take 1 tablet (81 mg  total) by mouth daily. 05/17/11 05/16/12  Biagio Borg, MD  estradiol (ESTRACE) 0.5 MG tablet Take 0.5 mg by mouth daily. 05/16/12   Historical Provider, MD  potassium chloride (KLOR-CON 10) 10 MEQ tablet Take 1 tablet (10 mEq total) by mouth daily. 05/18/11 05/17/12  Biagio Borg, MD   Physical Exam: Filed Vitals:   06/18/12 1528 06/18/12 1536 06/18/12 1602 06/18/12 1635  BP: 134/77 135/61 129/63 112/64  Pulse: 57 63 61 58  Temp:  98.7 F (37.1 C) 98.5 F (36.9 C)   TempSrc: Oral Oral Oral   Resp: 14 18 14 14   SpO2:  100% 100% 100% 100%     General exam: Moderately built and nourished female patient, lying comfortably supine on the gurney in no obvious distress.  Head, eyes and ENT: Nontraumatic and normocephalic. Pupils equally reacting to light and accommodation. Oral mucosa moist.  Neck: Supple. No JVD, carotid bruit or thyromegaly.  Lymphatics: No lymphadenopathy.  Respiratory system: Clear to auscultation. No increased work of breathing.  Cardiovascular system: S1 and S2 heard, RRR. No JVD, murmurs, gallops, clicks or pedal edema. Telemetry shows normal sinus rhythm  Gastrointestinal system: Abdomen is nondistended, soft and nontender. Normal bowel sounds heard. No organomegaly or masses appreciated.  Central nervous system: Alert and oriented. No focal neurological deficits.  Extremities: Symmetric 5 x 5 power. Peripheral pulses symmetrically felt.  Skin: No rashes or acute findings on exam of exposed skin.  Musculoskeletal system: Negative exam.  Psychiatry: Pleasant and cooperative.   Labs on Admission:  Basic Metabolic Panel:  Recent Labs Lab 06/18/12 1318  NA 139  K 3.2*  CL 98  CO2 32  GLUCOSE 94  BUN 12  CREATININE 0.85  CALCIUM 9.3   Liver Function Tests: No results found for this basename: AST, ALT, ALKPHOS, BILITOT, PROT, ALBUMIN,  in the last 168 hours No results found for this basename: LIPASE, AMYLASE,  in the last 168 hours No results found for this basename: AMMONIA,  in the last 168 hours CBC:  Recent Labs Lab 06/18/12 1318  WBC 3.1*  NEUTROABS 1.5*  HGB 11.7*  HCT 36.3  MCV 95.0  PLT 232   Cardiac Enzymes: No results found for this basename: CKTOTAL, CKMB, CKMBINDEX, TROPONINI,  in the last 168 hours  BNP (last 3 results) No results found for this basename: PROBNP,  in the last 8760 hours CBG: No results found for this basename: GLUCAP,  in the last 168 hours  Radiological Exams on Admission: Dg Chest 2 View  06/18/2012  *RADIOLOGY  REPORT*  Clinical Data: Chest pain  CHEST - 2 VIEW  Comparison: 05/17/2011 and multiple previous  Findings: Heart size is normal.  Extensive bilateral pulmonary scarring remains evident.  Previous pulmonary resection and on the left.  No sign of active infiltrate, mass, effusion or collapse. Chronic spinal curvature.  IMPRESSION: Extensive chronic scarring.  Postoperative changes on the left.  No identifiably acute pathology.   Original Report Authenticated By: Nelson Chimes, M.D.     EKG: Independently reviewed. Normal sinus rhythm at 63 beats per minute, normal axis without acute changes. Nonspecific T-wave changes. QTC 443 ms.  Assessment/Plan Principal Problem:   Chest pain at rest Active Problems:   HYPERLIPIDEMIA   HYPERTENSION   PULMONARY NODULE   GERD   COPD (chronic obstructive pulmonary disease)   Hypokalemia   Anemia   1. Chest pain: Rule out ACS. Admit to telemetry. Cycle cardiac enzymes. CAD risk factors: Age, HL, HTN and  prior smoking history. Aspirin and NTG. [facility] consulted for further evaluation- possible stress test. Chest pain currently resolved. 2. Hypokalemia: replete and follow BMP. 3. Chronic anemia and leukopenia: Unclear etiology-? Iron deficiency. Will check peripheral smear and anemia panel. May need outpatient further evaluation/hematology consultation. 4. Hypertension: Controlled. Continue amlodipine. 5. Hyperlipidemia: Not on medications. Consider starting statins      Code Status: Full   Family Communication: discussed with patient.  Disposition Plan: home when medically stable.   Time spent: 55 minutes.  [facility] Pager [phone]  If 7PM-7AM, please contact night-coverage [email or web address] Password [facility] 06/18/2012, 4:49 PM

## 2012-06-18 NOTE — ED Provider Notes (Signed)
History  This chart was scribed for [name] Rice, MD by [name], ED Scribe. This patient was seen in room A11C/A11C and the patient's care was started at 3:02 PM.  CSN: [id number]  Arrival date & time 06/18/12  1308   First MD Initiated Contact with Patient 06/18/12 1502      Chief Complaint  Patient presents with  . Chest Pain     Patient is a 67 y.o. female presenting with chest pain. The history is provided by the patient. No language interpreter was used.  Chest Pain Pain location:  Substernal area Pain quality: pressure   Pain radiates to:  Neck and R arm Pain radiates to the back: no   Pain severity:  Mild Onset quality:  Unable to specify Duration:  3 days Timing:  Intermittent Relieved by:  Nothing Worsened by:  Deep breathing Associated symptoms: dizziness and nausea   Associated symptoms: no abdominal pain, no cough, no fever, no shortness of breath and not vomiting   Risk factors: hypertension and smoking (former)   Risk factors: no diabetes mellitus and no high cholesterol     [name] is a 67 y.o. female who presents to the Emergency Department complaining of 2 to 3 days of intermittent substernal CP described as pressure that radiates into her back, up into her right neck and down her right arm with associated dizziness and nausea. She states that her pain is mild currently and reports that the pain is worse with deep breathes. She denies taking OTC medications at home to improve symptoms. She reports one prior episode for which she was admitted and states that she had a stress test performed at the time that was negative. She denies having any prior cardiac catheterizations performed and denies following up with a Cardiologist. She has a h/o HTN and takes daily HTN medications. She states that she currently takes one 81 mg ASA daily. She denies any recent cough, fevers, chills and SOB as associated symptoms. She has a h/o COPD and is a former  smoker.  Dr. Cathlean [name] is PCP.  Past Medical History  Diagnosis Date  . Hypertension   . COPD (chronic obstructive pulmonary disease) 05/23/2012    History reviewed. No pertinent past surgical history.  History reviewed. No pertinent family history.  History  Substance Use Topics  . Smoking status: Former Research scientist (life sciences)  . Smokeless tobacco: Not on file  . Alcohol Use: Not on file    No OB history provided.  Review of Systems  Constitutional: Negative for fever and chills.  Respiratory: Negative for cough and shortness of breath.   Cardiovascular: Positive for chest pain. Negative for leg swelling.  Gastrointestinal: Positive for nausea. Negative for vomiting, abdominal pain and diarrhea.  Neurological: Positive for dizziness.  All other systems reviewed and are negative.    Allergies  Ace inhibitors-angioedema per pt and Tetanus toxoid  Home Medications   Current Outpatient Rx  Name  Route  Sig  Dispense  Refill  . amLODipine (NORVASC) 10 MG tablet   Oral   Take 1 tablet (10 mg total) by mouth daily.   90 tablet   3   . aspirin EC 81 MG tablet   Oral   Take 81 mg by mouth daily.         . citalopram (CELEXA) 10 MG tablet   Oral   Take 1 tablet (10 mg total) by mouth daily.   90 tablet   3   . hydrochlorothiazide (  MICROZIDE) 12.5 MG capsule   Oral   Take 1 capsule (12.5 mg total) by mouth daily.   90 capsule   3   . ibuprofen (ADVIL,MOTRIN) 400 MG tablet   Oral   Take 400 mg by mouth every 8 (eight) hours as needed. 1-2 tabs by mouth every 6 hrs as needed for pain         . EXPIRED: aspirin EC 81 MG tablet   Oral   Take 1 tablet (81 mg total) by mouth daily.   100 tablet   11   . estradiol (ESTRACE) 0.5 MG tablet   Oral   Take 0.5 mg by mouth daily.         [name] EXPIRED: potassium chloride (KLOR-CON 10) 10 MEQ tablet   Oral   Take 1 tablet (10 mEq total) by mouth daily.   90 tablet   3     Triage Vitals: BP 137/67  Pulse 74  Temp(Src)  98.2 F (36.8 C) (Oral)  Resp 12  SpO2 100%  Physical Exam  Nursing note and vitals reviewed. Constitutional: She is oriented to person, place, and time. She appears well-developed and well-nourished. No distress.  HENT:  Head: Normocephalic and atraumatic.  Eyes: Conjunctivae and EOM are normal. Pupils are equal, round, and reactive to light.  Neck: Neck supple. No tracheal deviation present.  Cardiovascular: Normal rate and regular rhythm.  Exam reveals no gallop and no friction rub.   Murmur (systolic injection) heard. Pulmonary/Chest: Effort normal and breath sounds normal. No respiratory distress. She exhibits tenderness (mild right parasternal chest tenderness to palpation that is different from the chief complaint).  Abdominal: Soft. There is no tenderness.  Musculoskeletal: Normal range of motion. She exhibits no edema (no calf edema).  Neurological: She is alert and oriented to person, place, and time.  Skin: Skin is warm and dry.  Psychiatric: She has a normal mood and affect. Her behavior is normal.    ED Course  Procedures (including critical care time)  DIAGNOSTIC STUDIES: Oxygen Saturation is 100% on room air, normal by my interpretation.    COORDINATION OF CARE: 3:12 PM-Discussed treatment plan which includes ASA, nitro, CXR, CBC panel, and i-stat with pt at bedside and pt agreed to plan.   3:15 PM- Ordered 324 mg ASA and 0.5 inch of 2% nitro ointment   4:00 PM-Consult complete with [facility]. Patient case explained and discussed. [facility] agrees to admit patient for further evaluation and treatment. Call ended at 4:01 PM.  Labs Reviewed  CBC WITH DIFFERENTIAL - Abnormal; Notable for the following:    WBC 3.1 (*)    RBC 3.82 (*)    Hemoglobin 11.7 (*)    Neutro Abs 1.5 (*)    Eosinophils Relative 10 (*)    All other components within normal limits  BASIC METABOLIC PANEL - Abnormal; Notable for the following:    Potassium 3.2 (*)    GFR calc  non Af Amer 69 (*)    GFR calc Af Amer 80 (*)    All other components within normal limits  POCT I-STAT TROPONIN I   Dg Chest 2 View  06/18/2012  *RADIOLOGY REPORT*  Clinical Data: Chest pain  CHEST - 2 VIEW  Comparison: 05/17/2011 and multiple previous  Findings: Heart size is normal.  Extensive bilateral pulmonary scarring remains evident.  Previous pulmonary resection and on the left.  No sign of active infiltrate, mass, effusion or collapse. Chronic spinal curvature.  IMPRESSION: Extensive chronic  scarring.  Postoperative changes on the left.  No identifiably acute pathology.   Original Report Authenticated By: Nelson Chimes, M.D.      No diagnosis found.    Date: 06/18/2012  Rate: 63  Rhythm: normal sinus rhythm  QRS Axis: normal  Intervals: normal  ST/T Wave abnormalities: nonspecific T wave changes  Conduction Disutrbances:none  Narrative Interpretation:   Old EKG Reviewed: unchanged   MDM  I personally performed the services described in this documentation, which was scribed in my presence. The recorded information has been reviewed and is accurate.    [name] Rice, MD 06/18/12 [phone]

## 2012-06-18 NOTE — Telephone Encounter (Signed)
Patient Information:  Caller Name: Domanique  Phone: [phone]  Patient: [name], [name]  Gender: Female  DOB: 09-Mar-1946  Age: 67 Years  PCP: [name] (Adults only)  Office Follow Up:  Does the office need to follow up with this patient?: No  Instructions For The Office: N/A   Symptoms  Reason For Call & Symptoms: Pt calling that she has been having intermittent  chest pain since 06/16/12 and she ahs been feeling lightheaded.  She is having chest pressure now and did have pain in her shoulder and down her arm.  No SOB or Nausea.  Reviewed Health History In EMR: Yes  Reviewed Medications In EMR: Yes  Reviewed Allergies In EMR: Yes  Reviewed Surgeries / Procedures: No  Date of Onset of Symptoms: 06/16/2012  Treatments Tried: ibuprofen  Treatments Tried Worked: No  Guideline(s) Used:  Chest Pain  Disposition Per Guideline:   Go to ED Now (or to Office with PCP Approval)  PT HAVING CHEST PRESSURE NOW.  Reason For Disposition Reached:   Intermittent chest pain and pain has been increasing in severity or frequency  Advice Given:  N/A

## 2012-06-18 NOTE — ED Notes (Signed)
Pt c/o mid sternal CP x 3 days with dizziness and nausea; pt sts pain is like pressure

## 2012-06-19 DIAGNOSIS — R072 Precordial pain: Secondary | ICD-10-CM

## 2012-06-19 DIAGNOSIS — R079 Chest pain, unspecified: Secondary | ICD-10-CM

## 2012-06-19 DIAGNOSIS — J309 Allergic rhinitis, unspecified: Secondary | ICD-10-CM

## 2012-06-19 DIAGNOSIS — T7840XA Allergy, unspecified, initial encounter: Secondary | ICD-10-CM

## 2012-06-19 LAB — BASIC METABOLIC PANEL
BUN: 12 mg/dL (ref 6–23)
Chloride: 102 mEq/L (ref 96–112)
GFR calc Af Amer: 74 mL/min — ABNORMAL LOW (ref >90)
Potassium: 3.5 mEq/L (ref 3.5–5.1)
Sodium: 140 mEq/L (ref 135–145)

## 2012-06-19 LAB — LIPID PANEL
HDL: 79 mg/dL (ref >39)
LDL Cholesterol: 133 mg/dL — ABNORMAL HIGH (ref 0–99)
Total CHOL/HDL Ratio: 2.8 RATIO
Triglycerides: 67 mg/dL (ref <150)
VLDL: 13 mg/dL (ref 0–40)

## 2012-06-19 LAB — CBC
HCT: 31.9 % — ABNORMAL LOW (ref 36.0–46.0)
Hemoglobin: 10.3 g/dL — ABNORMAL LOW (ref 12.0–15.0)
MCHC: 32.3 g/dL (ref 30.0–36.0)
RBC: 3.35 MIL/uL — ABNORMAL LOW (ref 3.87–5.11)
WBC: 3.2 10*3/uL — ABNORMAL LOW (ref 4.0–10.5)

## 2012-06-19 NOTE — Progress Notes (Signed)
Stress echo performed.

## 2012-06-19 NOTE — Discharge Summary (Signed)
Physician Discharge Summary  [name] [id number] DOB: Jun 12, 1945 DOA: 06/18/2012  PCP: [name], MD  Admit date: 06/18/2012 Discharge date: 06/19/2012  Time spent: 35 minutes   Discharge Diagnoses:  Chest pain at rest - stress test negative    HYPERLIPIDEMIA   HYPERTENSION   PULMONARY NODULE   GERD   COPD (chronic obstructive pulmonary disease)   Hypokalemia   Anemia   Leukopenia   Discharge Condition: good  Diet recommendation: heart healthy   Filed Weights   06/18/12 1800  Weight: 67.132 kg (148 lb)    History of present illness:  [name] is a 67 y.o. female with PMH of HTN, HL, lung nodule-completed workup and discharged by pulmonologist presents to ED on 2/10 with 4 days history of chest pain. 4 days ago,patient woke up and experienced first episode of mid sternal pressure-like chest pain with radiation to back, right upper extremity and right side of neck. She felt lightheaded but denies dyspnea, pleuritic component to the pain or palpitations. She laid back in bed for 15 minutes and the chest pain resolved spontaneously. Since then patient has had intermittent similar chest pain but has progressively stayed longer. She denies any association with food intake but at times it increases with deep breaths but is not sure. Denies history of recent travel, trauma or lifting anything heavy. Since this morning, she noticed the chest pain has been more constant and severe 6/10 and presented to the emergency department. She denies fever, cough, chills or rigors. Prior to this episode, she has not had any chest pain since 2010 when she was evaluated with CT chest-negative for PE or dissection, Myoview that showed a partially reversible anterior defect due to breast attenuation versus CAD. She did follow up with cardiology and had a coronary CT angiogram which showed no coronary disease and a calcium score of 0. The hospitalist service is requested to admit for further  evaluation and management    Hospital Course:  1. Chest pain - patient was admitted after a chest pain episode at rest. She has had a history of inconclusive nuclear stress test. Patient was observed overnight on telemetry and troponins were checked 3 times. She did not have any recurrence of her symptoms while in the hospital. Patient underwent a EKG exercise stress test which did not reveal any inducible ischemia. Patient was instructed to resume her medications and was discharged home  Procedures: Stress test  Consultations:  Darlin Coco  Discharge Exam: Filed Vitals:   06/18/12 1800 06/18/12 2109 06/19/12 0512 06/19/12 1330  BP: 144/68 113/65 103/55 97/54  Pulse: 58 61 55 63  Temp: 98.2 F (36.8 C) 98.3 F (36.8 C) 98.1 F (36.7 C) 97.9 F (36.6 C)  TempSrc: Oral Oral Oral   Resp:  16 16 17   Height: 5\' 3"  (1.6 m)     Weight: 67.132 kg (148 lb)     SpO2: 100% 98% 97% 97%    General: axox3 Cardiovascular: rrr Respiratory: ctab  Discharge Instructions  Discharge Orders   Future Appointments Provider Department Dept Phone   06/25/2012 10:00 AM [facility] [place] [phone]   07/09/2012 11:00 AM Irene Shipper, MD [place] [phone]   05/28/2013 1:00 PM Biagio Borg, MD [facility] [phone]   Future Orders Complete By Expires     Diet general  As directed     Increase activity slowly  As directed  Medication List    STOP taking these medications       potassium chloride 10 MEQ tablet  Commonly known as:  KLOR-CON 10      TAKE these medications       amLODipine 10 MG tablet  Commonly known as:  NORVASC  Take 1 tablet (10 mg total) by mouth daily.     aspirin EC 81 MG tablet  Take 81 mg by mouth daily.     citalopram 10 MG tablet  Commonly known as:  CELEXA  Take 1 tablet (10 mg total) by mouth daily.     estradiol 0.5 MG tablet  Commonly known  as:  ESTRACE  Take 0.5 mg by mouth daily.     hydrochlorothiazide 12.5 MG capsule  Commonly known as:  MICROZIDE  Take 1 capsule (12.5 mg total) by mouth daily.     ibuprofen 400 MG tablet  Commonly known as:  ADVIL,MOTRIN  Take 400 mg by mouth every 8 (eight) hours as needed. 1-2 tabs by mouth every 6 hrs as needed for pain           Follow-up Information   Schedule an appointment as soon as possible for a visit with [name], MD.   Contact information:   [place]. [address] [place] [address] [phone]        The results of significant diagnostics from this hospitalization (including imaging, microbiology, ancillary and laboratory) are listed below for reference.    Significant Diagnostic Studies: Dg Chest 2 View  06/18/2012  *RADIOLOGY REPORT*  Clinical Data: Chest pain  CHEST - 2 VIEW  Comparison: 05/17/2011 and multiple previous  Findings: Heart size is normal.  Extensive bilateral pulmonary scarring remains evident.  Previous pulmonary resection and on the left.  No sign of active infiltrate, mass, effusion or collapse. Chronic spinal curvature.  IMPRESSION: Extensive chronic scarring.  Postoperative changes on the left.  No identifiably acute pathology.   Original Report Authenticated By: Nelson Chimes, M.D.     Microbiology: No results found for this or any previous visit (from the past 240 hour(s)).   Labs: Basic Metabolic Panel:  Recent Labs Lab 06/18/12 1318 06/19/12 0440  NA 139 140  K 3.2* 3.5  CL 98 102  CO2 32 30  GLUCOSE 94 83  BUN 12 12  CREATININE 0.85 0.90  CALCIUM 9.3 8.7   Liver Function Tests: No results found for this basename: AST, ALT, ALKPHOS, BILITOT, PROT, ALBUMIN,  in the last 168 hours No results found for this basename: LIPASE, AMYLASE,  in the last 168 hours No results found for this basename: AMMONIA,  in the last 168 hours CBC:  Recent Labs Lab 06/18/12 1318 06/19/12 0440  WBC 3.1* 3.2*   NEUTROABS 1.5*  --   HGB 11.7* 10.3*  HCT 36.3 31.9*  MCV 95.0 95.2  PLT 232 200   Cardiac Enzymes:  Recent Labs Lab 06/18/12 1704 06/18/12 2323 06/19/12 0440  TROPONINI <0.30 <0.30 <0.30   BNP: BNP (last 3 results) No results found for this basename: PROBNP,  in the last 8760 hours CBG: No results found for this basename: GLUCAP,  in the last 168 hours     Signed:  Elizabella Nolet  [facility] 06/19/2012, 4:13 PM

## 2012-06-19 NOTE — Care Management Note (Signed)
    Page 1 of 1   06/19/2012     2:33:39 PM   CARE MANAGEMENT NOTE 06/19/2012  Patient:  [name], [name]   Account Number:  [id number]  Date Initiated:  06/19/2012  Documentation initiated by:  GRAVES-BIGELOW,Katalea Ucci  Subjective/Objective Assessment:   Pt admited with cp. Plan for d/c hopefully late afternoon.     Action/Plan:   No needs from CM at this time.   Anticipated DC Date:  06/19/2012   Anticipated DC Plan:  [place]  CM consult      Choice offered to / List presented to:             Status of service:  Completed, signed off Medicare Important Message given?   (If response is "NO", the following Medicare IM given date fields will be blank) Date Medicare IM given:   Date Additional Medicare IM given:    Discharge Disposition:  HOME/SELF CARE  Per UR Regulation:  Reviewed for med. necessity/level of care/duration of stay  If discussed at [place] of Stay Meetings, dates discussed:    Comments:

## 2012-06-19 NOTE — Progress Notes (Signed)
Patient Name: [name] Date of Encounter: 06/19/2012  Principal Problem:   Chest pain at rest Active Problems:   HYPERLIPIDEMIA   HYPERTENSION   PULMONARY NODULE   GERD   COPD (chronic obstructive pulmonary disease)   Hypokalemia   Anemia   Leukopenia    SUBJECTIVE: No more chest pain, no SOB  OBJECTIVE Filed Vitals:   06/18/12 1743 06/18/12 1800 06/18/12 2109 06/19/12 0512  BP: 132/61 144/68 113/65 103/55  Pulse: 60 58 61 55  Temp:  98.2 F (36.8 C) 98.3 F (36.8 C) 98.1 F (36.7 C)  TempSrc:  Oral Oral Oral  Resp: 14  16 16   Height:  5\' 3"  (1.6 m)    Weight:  148 lb (67.132 kg)    SpO2: 100% 100% 98% 97%   No intake or output data in the 24 hours ending 06/19/12 1156 Filed Weights   06/18/12 1800  Weight: 148 lb (67.132 kg)     PHYSICAL EXAM General: Well developed, well nourished, female in no acute distress. Head: Normocephalic, atraumatic.  Neck: Supple without bruits, JVD not elevated. Lungs:  Resp regular and unlabored, CTA. Heart: RRR, S1, S2, no S3, S4, or murmur. Abdomen: Soft, non-tender, non-distended, BS + x 4.  Extremities: No clubbing, cyanosis, no edema.  Neuro: Alert and oriented X 3. Moves all extremities spontaneously. Psych: Normal affect.  LABS: CBC: Recent Labs  06/18/12 1318 06/19/12 0440  WBC 3.1* 3.2*  NEUTROABS 1.5*  --   HGB 11.7* 10.3*  HCT 36.3 31.9*  MCV 95.0 95.2  PLT 232 200   INR:No results found for this basename: INR,  in the last 72 hours Basic Metabolic Panel: Recent Labs  06/18/12 1318 06/19/12 0440  NA 139 140  K 3.2* 3.5  CL 98 102  CO2 32 30  GLUCOSE 94 83  BUN 12 12  CREATININE 0.85 0.90  CALCIUM 9.3 8.7   Cardiac Enzymes: Recent Labs  06/18/12 1704 06/18/12 2323 06/19/12 0440  TROPONINI <0.30 <0.30 <0.30    Recent Labs  06/18/12 1343  TROPIPOC 0.00   D-dimer: Recent Labs  06/18/12 1700  DDIMER 0.29   Fasting Lipid Panel: Recent Labs  06/19/12 0440  CHOL 225*    HDL 79  LDLCALC 133*  TRIG 67  CHOLHDL 2.8   TELE:  SR  Radiology/Studies: Dg Chest 2 View 06/18/2012  *RADIOLOGY REPORT*  Clinical Data: Chest pain  CHEST - 2 VIEW  Comparison: 05/17/2011 and multiple previous  Findings: Heart size is normal.  Extensive bilateral pulmonary scarring remains evident.  Previous pulmonary resection and on the left.  No sign of active infiltrate, mass, effusion or collapse. Chronic spinal curvature.  IMPRESSION: Extensive chronic scarring.  Postoperative changes on the left.  No identifiably acute pathology.   Original Report Authenticated By: Nelson Chimes, M.D.     Current Medications:  . amLODipine  10 mg Oral Daily  . aspirin EC  325 mg Oral Daily  . citalopram  10 mg Oral Daily  . enoxaparin (LOVENOX) injection  40 mg Subcutaneous Q24H  . estradiol  0.5 mg Oral Daily  . hydrochlorothiazide  12.5 mg Oral Daily  . influenza  inactive virus vaccine  0.5 mL Intramuscular Tomorrow-1000  . nitroGLYCERIN  0.5 inch Topical Q6H  . sodium chloride  3 mL Intravenous Q12H      ASSESSMENT AND PLAN: Principal Problem:   Chest pain at rest - No more chest pain, no SOB, ez negative for MI, stress echo  today.  Active Problems:   HYPERLIPIDEMIA   HYPERTENSION   PULMONARY NODULE   GERD   COPD (chronic obstructive pulmonary disease)   Hypokalemia   Anemia   Leukopenia   Signed, Rosaria Ferries , PA-C 11:56 AM 06/19/2012 Agree with assessment as above.  The patient is not having any chest discomfort at present.  Her cardiac enzymes are negative.  If her stress echo today is negative we can anticipate discharge home later this afternoon.

## 2012-06-19 NOTE — Progress Notes (Signed)
  Echocardiogram 2D Echocardiogram has been performed.  [name] 06/19/2012, 12:24 PM

## 2012-06-25 ENCOUNTER — Ambulatory Visit (AMBULATORY_SURGERY_CENTER): Payer: Medicare Other | Admitting: *Deleted

## 2012-06-25 ENCOUNTER — Encounter: Payer: Self-pay | Admitting: Internal Medicine

## 2012-06-25 VITALS — Ht 63.0 in | Wt 152.4 lb

## 2012-06-25 DIAGNOSIS — Z1211 Encounter for screening for malignant neoplasm of colon: Secondary | ICD-10-CM

## 2012-06-25 MED ORDER — MOVIPREP 100 G PO SOLR: ORAL | Status: DC

## 2012-06-29 ENCOUNTER — Encounter: Payer: Self-pay | Admitting: Internal Medicine

## 2012-06-29 ENCOUNTER — Other Ambulatory Visit (INDEPENDENT_AMBULATORY_CARE_PROVIDER_SITE_OTHER): Payer: Medicare Other

## 2012-06-29 ENCOUNTER — Ambulatory Visit (INDEPENDENT_AMBULATORY_CARE_PROVIDER_SITE_OTHER): Payer: Medicare Other | Admitting: Internal Medicine

## 2012-06-29 VITALS — BP 112/68 | HR 60 | Temp 98.2°F | Ht 63.0 in | Wt 153.0 lb

## 2012-06-29 DIAGNOSIS — D649 Anemia, unspecified: Secondary | ICD-10-CM

## 2012-06-29 DIAGNOSIS — E876 Hypokalemia: Secondary | ICD-10-CM

## 2012-06-29 DIAGNOSIS — E785 Hyperlipidemia, unspecified: Secondary | ICD-10-CM

## 2012-06-29 DIAGNOSIS — F341 Dysthymic disorder: Secondary | ICD-10-CM

## 2012-06-29 DIAGNOSIS — R079 Chest pain, unspecified: Secondary | ICD-10-CM

## 2012-06-29 LAB — CBC WITH DIFFERENTIAL/PLATELET
Basophils Relative: 1.3 % (ref 0.0–3.0)
Eosinophils Relative: 7.3 % — ABNORMAL HIGH (ref 0.0–5.0)
Hemoglobin: 11.7 g/dL — ABNORMAL LOW (ref 12.0–15.0)
Lymphocytes Relative: 34 % (ref 12.0–46.0)
MCHC: 33.4 g/dL (ref 30.0–36.0)
Monocytes Relative: 10 % (ref 3.0–12.0)
Neutro Abs: 2.1 10*3/uL (ref 1.4–7.7)
Neutrophils Relative %: 47.4 % (ref 43.0–77.0)
RBC: 3.69 Mil/uL — ABNORMAL LOW (ref 3.87–5.11)
WBC: 4.5 10*3/uL (ref 4.5–10.5)

## 2012-06-29 LAB — BASIC METABOLIC PANEL
BUN: 14 mg/dL (ref 6–23)
Chloride: 100 mEq/L (ref 96–112)
GFR: 84.19 mL/min (ref >60.00)
Potassium: 3.6 mEq/L (ref 3.5–5.1)
Sodium: 139 mEq/L (ref 135–145)

## 2012-06-29 LAB — IBC PANEL
Iron: 76 ug/dL (ref 42–145)
Saturation Ratios: 18.7 % — ABNORMAL LOW (ref 20.0–50.0)
Transferrin: 290.8 mg/dL (ref 212.0–360.0)

## 2012-06-29 MED ORDER — CITALOPRAM HYDROBROMIDE 20 MG PO TABS: 20.0000 mg | ORAL_TABLET | Freq: Every day | ORAL | Status: DC

## 2012-06-29 MED ORDER — CLONAZEPAM 0.5 MG PO TABS: 0.5000 mg | ORAL_TABLET | Freq: Two times a day (BID) | ORAL | Status: DC | PRN

## 2012-06-29 NOTE — Patient Instructions (Addendum)
Please take all new medication as prescribed Please continue all other medications as before, and refills have been done if requested Please go to the LAB in the Basement (turn left off the elevator) for the tests to be done today You will be contacted by phone if any changes need to be made immediately.  Otherwise, you will receive a letter about your results with an explanation, but please check with MyChart first. Thank you for enrolling in [facility]. Please follow the instructions below to securely access your online medical record. MyChart allows you to send messages to your doctor, view your test results, renew your prescriptions, schedule appointments, and more. Please return in 6 months, or sooner if needed, with Lab testing done 3-5 days before

## 2012-06-29 NOTE — Progress Notes (Signed)
Subjective:    Patient ID: [name], female    DOB: 05-12-1945, 67 y.o.   MRN: [id number]  HPI  Here to f/u; overall doing ok,  Pt denies chest pain since recent hosp eval, increased sob or doe, wheezing, orthopnea, PND, increased LE swelling, palpitations, dizziness or syncope.  Pt denies polydipsia, polyuria Pt denies new neurological symptoms such as new headache, or facial or extremity weakness or numbness.   Pt states overall good compliance with meds, has been trying to follow lower cholesterol, diabetic diet, with wt overall stable,  but little exercise however.  C/o increased stress recent- has mult stressors including financial, with increased depression but no suicidal ideation, or panic.  No overt bleeding or bruising, had recent mild anemia, etiology not clear Past Medical History  Diagnosis Date  . Hypertension   . COPD (chronic obstructive pulmonary disease) 05/23/2012  . Hyperlipidemia 05/23/2012    T. Chol 234, LDL 130    Past Surgical History  Procedure Laterality Date  . Lung biopsy      left side, not cancerous  . Cervical spine surgery    . Vaginal hysterectomy      reports that she has quit smoking. She has never used smokeless tobacco. She reports that  drinks alcohol. She reports that she does not use illicit drugs. family history includes Hypertension in her sister and Throat cancer in her father.  There is no history of Colon cancer and Stomach cancer. Allergies  Allergen Reactions  . Ace Inhibitors     REACTION: angioedema  . Tetanus Toxoid     Cellulitis in arm   Current Outpatient Prescriptions on File Prior to Visit  Medication Sig Dispense Refill  . amLODipine (NORVASC) 10 MG tablet Take 1 tablet (10 mg total) by mouth daily.  90 tablet  3  . aspirin EC 81 MG tablet Take 81 mg by mouth daily.      [name] estradiol (ESTRACE) 0.5 MG tablet Take 0.5 mg by mouth daily.      . hydrochlorothiazide (MICROZIDE) 12.5 MG capsule Take 1 capsule (12.5 mg total) by  mouth daily.  90 capsule  3  . ibuprofen (ADVIL,MOTRIN) 400 MG tablet Take 400 mg by mouth every 8 (eight) hours as needed. 1-2 tabs by mouth every 6 hrs as needed for pain      . MOVIPREP 100 G SOLR moviprep as directed. No substitutions  1 kit  0   No current facility-administered medications on file prior to visit.   Review of Systems  Constitutional: Negative for unexpected weight change, or unusual diaphoresis  HENT: Negative for tinnitus.   Eyes: Negative for photophobia and visual disturbance.  Respiratory: Negative for choking and stridor.   Gastrointestinal: Negative for vomiting and blood in stool.  Genitourinary: Negative for hematuria and decreased urine volume.  Musculoskeletal: Negative for acute joint swelling Skin: Negative for color change and wound.  Neurological: Negative for tremors and numbness other than noted  Psychiatric/Behavioral: Negative for decreased concentration or  hyperactivity.       Objective:   Physical Exam BP 112/68  Pulse 60  Temp(Src) 98.2 F (36.8 C) (Oral)  Ht 5\' 3"  (1.6 m)  Wt 153 lb (69.4 kg)  BMI 27.11 kg/m2  SpO2 98% VS noted,  Constitutional: Pt appears well-developed and well-nourished.  HENT: Head: NCAT.  Right Ear: External ear normal.  Left Ear: External ear normal.  Eyes: Conjunctivae and EOM are normal. Pupils are equal, round, and reactive to light.  Neck: Normal range of motion. Neck supple.  Cardiovascular: Normal rate and regular rhythm.   Pulmonary/Chest: Effort normal and breath sounds normal.  Abd:  Soft, NT, non-distended, + BS Neurological: Pt is alert. Not confused  Skin: Skin is warm. No erythema. No bruising or bleeding Psychiatric: Pt behavior is normal. Thought content normal. 1-2+ nervous    Assessment & Plan:

## 2012-06-30 NOTE — Assessment & Plan Note (Signed)
To f/u labs today, asympt,  to f/u any worsening symptoms or concerns

## 2012-06-30 NOTE — Assessment & Plan Note (Signed)
For increased celexa, add klonopin bid prn, declines counseling or psychiatric referral

## 2012-06-30 NOTE — Assessment & Plan Note (Addendum)
asympt since d/c, pt declines further eval or tx, recent stress test without definite ischemia

## 2012-06-30 NOTE — Assessment & Plan Note (Addendum)
stable overall by history and exam, recent data reviewed with pt, and pt to continue medical treatment as before,  to f/u any worsening symptoms or concerns, for lab f/u, cont diet Lab Results  Component Value Date   LDLCALC 133* 06/19/2012   Declines statin for now

## 2012-06-30 NOTE — Assessment & Plan Note (Signed)
Asympt, for f/u labs today, r/o iron deficiency

## 2012-07-09 ENCOUNTER — Ambulatory Visit (AMBULATORY_SURGERY_CENTER): Payer: Medicare Other | Admitting: Internal Medicine

## 2012-07-09 ENCOUNTER — Encounter: Payer: Self-pay | Admitting: Internal Medicine

## 2012-07-09 VITALS — BP 109/47 | HR 57 | Temp 96.4°F | Resp 13 | Ht 63.0 in | Wt 152.0 lb

## 2012-07-09 DIAGNOSIS — K5730 Diverticulosis of large intestine without perforation or abscess without bleeding: Secondary | ICD-10-CM

## 2012-07-09 DIAGNOSIS — Z1211 Encounter for screening for malignant neoplasm of colon: Secondary | ICD-10-CM

## 2012-07-09 MED ORDER — SODIUM CHLORIDE 0.9 % IV SOLN: 500.0000 mL | INTRAVENOUS | Status: DC

## 2012-07-09 NOTE — Progress Notes (Signed)
No complaints noted in the recovery room. Maw   

## 2012-07-09 NOTE — Progress Notes (Signed)
The pt is waiting for her friend to bring her a change of clothing.  I just unhooked her from the monitor and let her wait in the bed until he arrives. Maw

## 2012-07-09 NOTE — Op Note (Signed)
[facility]  [address] [place], [id number]   COLONOSCOPY PROCEDURE REPORT  PATIENT: [name], [name].  MR#: [id number] BIRTHDATE: July 08, 1945 , 66  yrs. old GENDER: Female ENDOSCOPIST: Eustace Quail, MD REFERRED [id number] John, M.D. PROCEDURE DATE:  07/09/2012 PROCEDURE:   Colonoscopy, screening ASA CLASS:   Class II INDICATIONS:Average risk patient for colon cancer.   Negative index exam 2001 MEDICATIONS: MAC sedation, administered by CRNA and propofol (Diprivan) 300mg  IV  DESCRIPTION OF PROCEDURE:   After the risks benefits and alternatives of the procedure were thoroughly explained, informed consent was obtained.  A digital rectal exam revealed no abnormalities of the rectum.   The LB [id number]-[id number]  endoscope was introduced through the anus and advanced to the cecum, which was identified by both the appendix and ileocecal valve. No adverse events experienced.   The quality of the prep was adequate, using MoviPrep  The instrument was then slowly withdrawn as the colon was fully examined.      COLON FINDINGS: Mild diverticulosis was noted in the sigmoid colon. The colon was otherwise normal.  There was no inflammation, polyps or cancers .  Retroflexed views revealed no abnormalities. The time to cecum=4 minutes 28 seconds.  Withdrawal time=8 minutes 53 seconds.  The scope was withdrawn and the procedure completed. COMPLICATIONS: There were no complications.  ENDOSCOPIC IMPRESSION: 1.   Mild diverticulosis was noted in the sigmoid colon 2.   The colon was otherwise normal  RECOMMENDATIONS: 1. Continue current colorectal screening recommendations for "routine risk" patients with a repeat colonoscopy in 10 years.   eSigned:  Eustace Quail, MD 07/09/2012 11:16 AM   cc: Biagio Borg, MD and The Patient

## 2012-07-09 NOTE — Patient Instructions (Addendum)
Handouts were given to your care partner on diverticulosis and a high fiber diet.  You may resume your current medications today.  Please call if any questions or concerns.      YOU HAD AN ENDOSCOPIC PROCEDURE TODAY AT [facility]: Refer to the procedure report that was given to you for any specific questions about what was found during the examination.  If the procedure report does not answer your questions, please call your gastroenterologist to clarify.  If you requested that your care partner not be given the details of your procedure findings, then the procedure report has been included in a sealed envelope for you to review at your convenience later.  YOU SHOULD EXPECT: Some feelings of bloating in the abdomen. Passage of more gas than usual.  Walking can help get rid of the air that was put into your GI tract during the procedure and reduce the bloating. If you had a lower endoscopy (such as a colonoscopy or flexible sigmoidoscopy) you may notice spotting of blood in your stool or on the toilet paper. If you underwent a bowel prep for your procedure, then you may not have a normal bowel movement for a few days.  DIET: Your first meal following the procedure should be a light meal and then it is ok to progress to your normal diet.  A half-sandwich or bowl of soup is an example of a good first meal.  Heavy or fried foods are harder to digest and may make you feel nauseous or bloated.  Likewise meals heavy in dairy and vegetables can cause extra gas to form and this can also increase the bloating.  Drink plenty of fluids but you should avoid alcoholic beverages for 24 hours.  ACTIVITY: Your care partner should take you home directly after the procedure.  You should plan to take it easy, moving slowly for the rest of the day.  You can resume normal activity the day after the procedure however you should NOT DRIVE or use heavy machinery for 24 hours (because of the sedation medicines  used during the test).    SYMPTOMS TO REPORT IMMEDIATELY: A gastroenterologist can be reached at any hour.  During normal business hours, 8:30 AM to 5:00 PM Monday through Friday, call [phone].  After hours and on weekends, please call the GI answering service at [phone] who will take a message and have the physician on call contact you.   Following lower endoscopy (colonoscopy or flexible sigmoidoscopy):  Excessive amounts of blood in the stool  Significant tenderness or worsening of abdominal pains  Swelling of the abdomen that is new, acute  Fever of 100F or higher    FOLLOW UP: If any biopsies were taken you will be contacted by phone or by letter within the next 1-3 weeks.  Call your gastroenterologist if you have not heard about the biopsies in 3 weeks.  Our staff will call the home number listed on your records the next business day following your procedure to check on you and address any questions or concerns that you may have at that time regarding the information given to you following your procedure. This is a courtesy call and so if there is no answer at the home number and we have not heard from you through the emergency physician on call, we will assume that you have returned to your regular daily activities without incident.  SIGNATURES/CONFIDENTIALITY: You and/or your care partner have signed paperwork which will be  entered into your electronic medical record.  These signatures attest to the fact that that the information above on your After Visit Summary has been reviewed and is understood.  Full responsibility of the confidentiality of this discharge information lies with you and/or your care-partner. 

## 2012-07-09 NOTE — Progress Notes (Signed)
Patient did not experience any of the following events: a burn prior to discharge; a fall within the facility; wrong site/side/patient/procedure/implant event; or a hospital transfer or hospital admission upon discharge from the facility. (G8907) Patient did not have preoperative order for IV antibiotic SSI prophylaxis. (G8918)  

## 2012-07-10 ENCOUNTER — Telehealth: Payer: Self-pay | Admitting: *Deleted

## 2012-07-10 NOTE — Telephone Encounter (Signed)
  Follow up Call-  Call back number 07/09/2012  Post procedure Call Back phone  # [phone]  Permission to leave phone message Yes     No answer and no answering machine picked up to leave a message

## 2013-03-14 ENCOUNTER — Other Ambulatory Visit: Payer: Self-pay

## 2013-05-28 ENCOUNTER — Ambulatory Visit (INDEPENDENT_AMBULATORY_CARE_PROVIDER_SITE_OTHER): Payer: Medicare HMO

## 2013-05-28 ENCOUNTER — Ambulatory Visit (INDEPENDENT_AMBULATORY_CARE_PROVIDER_SITE_OTHER): Payer: Medicare HMO | Admitting: Internal Medicine

## 2013-05-28 ENCOUNTER — Encounter: Payer: Self-pay | Admitting: Internal Medicine

## 2013-05-28 VITALS — BP 112/70 | HR 77 | Temp 97.9°F | Ht 63.0 in | Wt 149.4 lb

## 2013-05-28 DIAGNOSIS — Z0000 Encounter for general adult medical examination without abnormal findings: Secondary | ICD-10-CM

## 2013-05-28 DIAGNOSIS — Z23 Encounter for immunization: Secondary | ICD-10-CM

## 2013-05-28 LAB — CBC WITH DIFFERENTIAL/PLATELET
BASOS PCT: 1.1 % (ref 0.0–3.0)
Basophils Absolute: 0 10*3/uL (ref 0.0–0.1)
EOS ABS: 0.3 10*3/uL (ref 0.0–0.7)
Eosinophils Relative: 7.7 % — ABNORMAL HIGH (ref 0.0–5.0)
HCT: 35.4 % — ABNORMAL LOW (ref 36.0–46.0)
Hemoglobin: 11.8 g/dL — ABNORMAL LOW (ref 12.0–15.0)
Lymphocytes Relative: 31.2 % (ref 12.0–46.0)
Lymphs Abs: 1.2 10*3/uL (ref 0.7–4.0)
MCHC: 33.4 g/dL (ref 30.0–36.0)
MCV: 93.3 fl (ref 78.0–100.0)
MONO ABS: 0.3 10*3/uL (ref 0.1–1.0)
Monocytes Relative: 7.2 % (ref 3.0–12.0)
NEUTROS PCT: 52.8 % (ref 43.0–77.0)
Neutro Abs: 2.1 10*3/uL (ref 1.4–7.7)
Platelets: 223 10*3/uL (ref 150.0–400.0)
RBC: 3.79 Mil/uL — AB (ref 3.87–5.11)
RDW: 14.3 % (ref 11.5–14.6)
WBC: 4 10*3/uL — ABNORMAL LOW (ref 4.5–10.5)

## 2013-05-28 LAB — TSH: TSH: 1.13 u[IU]/mL (ref 0.35–5.50)

## 2013-05-28 LAB — LIPID PANEL
CHOLESTEROL: 226 mg/dL — AB (ref 0–200)
HDL: 71.1 mg/dL (ref >39.00)
TRIGLYCERIDES: 50 mg/dL (ref 0.0–149.0)
Total CHOL/HDL Ratio: 3
VLDL: 10 mg/dL (ref 0.0–40.0)

## 2013-05-28 LAB — BASIC METABOLIC PANEL
BUN: 14 mg/dL (ref 6–23)
CHLORIDE: 103 meq/L (ref 96–112)
CO2: 31 mEq/L (ref 19–32)
Calcium: 9.1 mg/dL (ref 8.4–10.5)
Creatinine, Ser: 1.1 mg/dL (ref 0.4–1.2)
GFR: 66.69 mL/min (ref >60.00)
Glucose, Bld: 118 mg/dL — ABNORMAL HIGH (ref 70–99)
POTASSIUM: 3.4 meq/L — AB (ref 3.5–5.1)
SODIUM: 142 meq/L (ref 135–145)

## 2013-05-28 LAB — HEPATIC FUNCTION PANEL
ALBUMIN: 3.7 g/dL (ref 3.5–5.2)
ALT: 11 U/L (ref 0–35)
AST: 20 U/L (ref 0–37)
Alkaline Phosphatase: 99 U/L (ref 39–117)
BILIRUBIN DIRECT: 0 mg/dL (ref 0.0–0.3)
TOTAL PROTEIN: 7.4 g/dL (ref 6.0–8.3)
Total Bilirubin: 0.5 mg/dL (ref 0.3–1.2)

## 2013-05-28 LAB — LDL CHOLESTEROL, DIRECT: Direct LDL: 143.2 mg/dL

## 2013-05-28 MED ORDER — HYDROCHLOROTHIAZIDE 12.5 MG PO CAPS: 12.5000 mg | ORAL_CAPSULE | Freq: Every day | ORAL | Status: DC

## 2013-05-28 NOTE — Progress Notes (Signed)
Pre-visit discussion using our clinic review tool. No additional management support is needed unless otherwise documented below in the visit note.  

## 2013-05-28 NOTE — Patient Instructions (Addendum)
You had the flu shot today Please return in 2 wks for a Nurse Visit appt for the new Prevnar pneuomonia shot Please continue all other medications as before, and refills have been done if requested. Please have the pharmacy call with any other refills you may need. Please continue your efforts at being more active, low cholesterol diet, and weight control. You are otherwise up to date with prevention measures today. Please keep your appointments with your specialists as you may have planned  Your lab work is pending after being done this am (please return to give the specimen as well)  You will be contacted by phone if any changes need to be made immediately.  Otherwise, you will receive a letter about your results with an explanation, but please check with MyChart first.  Please remember to sign up for My Chart if you have not done so, as this will be important to you in the future with finding out test results, communicating by private email, and scheduling acute appointments online when needed.  Please return in 1 year for your yearly visit, or sooner if needed, with Lab testing done 3-5 days before

## 2013-05-28 NOTE — Progress Notes (Signed)
Subjective:    Patient ID: [name], female    DOB: [birth year], 68 y.o.   MRN: [id number]  HPI  Here for wellness and f/u;  Overall doing ok;  Pt denies CP, worsening SOB, DOE, wheezing, orthopnea, PND, worsening LE edema, palpitations, dizziness or syncope.  Pt denies neurological change such as new headache, facial or extremity weakness.  Pt denies polydipsia, polyuria, or low sugar symptoms. Pt states overall good compliance with treatment and medications, good tolerability, and has been trying to follow lower cholesterol diet.  Pt denies worsening depressive symptoms, suicidal ideation or panic. No fever, night sweats, wt loss, loss of appetite, or other constitutional symptoms.  Pt states good ability with ADL's, has low fall risk, home safety reviewed and adequate, no other significant changes in hearing or vision, and only occasionally active with exercise. Better after med tx per GYn for anx/depression, no SI, overall feels improved, declines need for counseling. Past Medical History  Diagnosis Date  . Hypertension   . COPD (chronic obstructive pulmonary disease) 05/23/2012  . Hyperlipidemia 05/23/2012    T. Chol 234, LDL 130    Past Surgical History  Procedure Laterality Date  . Lung biopsy      left side, not cancerous  . Cervical spine surgery    . Vaginal hysterectomy      reports that she has quit smoking. She has never used smokeless tobacco. She reports that she drinks alcohol. She reports that she does not use illicit drugs. family history includes Hypertension in her sister; Throat cancer in her father. There is no history of Colon cancer or Stomach cancer. Allergies  Allergen Reactions  . Ace Inhibitors     REACTION: angioedema  . Tetanus Toxoid     Cellulitis in arm   Current Outpatient Prescriptions on File Prior to Visit  Medication Sig Dispense Refill  . amLODipine (NORVASC) 10 MG tablet Take 1 tablet (10 mg total) by mouth daily.  90 tablet  3  . aspirin  EC 81 MG tablet Take 81 mg by mouth daily.       No current facility-administered medications on file prior to visit.    Review of Systems Constitutional: Negative for diaphoresis, activity change, appetite change or unexpected weight change.  HENT: Negative for hearing loss, ear pain, facial swelling, mouth sores and neck stiffness.   Eyes: Negative for pain, redness and visual disturbance.  Respiratory: Negative for shortness of breath and wheezing.   Cardiovascular: Negative for chest pain and palpitations.  Gastrointestinal: Negative for diarrhea, blood in stool, abdominal distention or other pain Genitourinary: Negative for hematuria, flank pain or change in urine volume.  Musculoskeletal: Negative for myalgias and joint swelling.  Skin: Negative for color change and wound.  Neurological: Negative for syncope and numbness. other than noted Hematological: Negative for adenopathy.  Psychiatric/Behavioral: Negative for hallucinations, self-injury, decreased concentration and agitation.      Objective:   Physical Exam BP 112/70  Pulse 77  Temp(Src) 97.9 F (36.6 C) (Oral)  Ht 5\' 3"  (1.6 m)  Wt 149 lb 6 oz (67.756 kg)  BMI 26.47 kg/m2  SpO2 97% VS noted,  Constitutional: Pt is oriented to person, place, and time. Appears well-developed and well-nourished.  Head: Normocephalic and atraumatic.  Right Ear: External ear normal.  Left Ear: External ear normal.  Nose: Nose normal.  Mouth/Throat: Oropharynx is clear and moist.  Eyes: Conjunctivae and EOM are normal. Pupils are equal, round, and reactive to light.  Neck:  Normal range of motion. Neck supple. No JVD present. No tracheal deviation present.  Cardiovascular: Normal rate, regular rhythm, normal heart sounds and intact distal pulses.   Pulmonary/Chest: Effort normal and breath sounds normal.  Abdominal: Soft. Bowel sounds are normal. There is no tenderness. No HSM  Musculoskeletal: Normal range of motion. Exhibits no  edema.  Lymphadenopathy:  Has no cervical adenopathy.  Neurological: Pt is alert and oriented to person, place, and time. Pt has normal reflexes. No cranial nerve deficit.  Skin: Skin is warm and dry. No rash noted.  Psychiatric:  Has  normal mood and affect. Behavior is normal.     Assessment & Plan:

## 2013-05-28 NOTE — Assessment & Plan Note (Signed)

## 2013-05-29 ENCOUNTER — Other Ambulatory Visit: Payer: Self-pay | Admitting: Internal Medicine

## 2013-05-30 ENCOUNTER — Other Ambulatory Visit: Payer: Self-pay | Admitting: Internal Medicine

## 2013-06-11 ENCOUNTER — Other Ambulatory Visit: Payer: Self-pay | Admitting: Internal Medicine

## 2013-06-11 MED ORDER — ATORVASTATIN CALCIUM 10 MG PO TABS: 10.0000 mg | ORAL_TABLET | Freq: Every day | ORAL | Status: DC

## 2013-06-18 ENCOUNTER — Other Ambulatory Visit: Payer: Self-pay | Admitting: Internal Medicine

## 2013-10-17 ENCOUNTER — Encounter: Payer: Self-pay | Admitting: Internal Medicine

## 2013-10-17 ENCOUNTER — Ambulatory Visit (INDEPENDENT_AMBULATORY_CARE_PROVIDER_SITE_OTHER): Payer: Medicare HMO | Admitting: Internal Medicine

## 2013-10-17 VITALS — BP 100/68 | HR 76 | Temp 98.8°F | Ht 63.0 in | Wt 153.0 lb

## 2013-10-17 DIAGNOSIS — J449 Chronic obstructive pulmonary disease, unspecified: Secondary | ICD-10-CM

## 2013-10-17 DIAGNOSIS — Z0000 Encounter for general adult medical examination without abnormal findings: Secondary | ICD-10-CM

## 2013-10-17 DIAGNOSIS — I10 Essential (primary) hypertension: Secondary | ICD-10-CM

## 2013-10-17 DIAGNOSIS — F341 Dysthymic disorder: Secondary | ICD-10-CM

## 2013-10-17 MED ORDER — AMLODIPINE BESYLATE 5 MG PO TABS: 5.0000 mg | ORAL_TABLET | Freq: Every day | ORAL | Status: DC

## 2013-10-17 NOTE — Assessment & Plan Note (Signed)
stable overall by history and exam, recent data reviewed with pt, and pt to continue medical treatment as before,  to f/u any worsening symptoms or concerns Lab Results  Component Value Date   WBC 4.0* 05/28/2013   HGB 11.8* 05/28/2013   HCT 35.4* 05/28/2013   PLT 223.0 05/28/2013   GLUCOSE 118* 05/28/2013   CHOL 226* 05/28/2013   TRIG 50.0 05/28/2013   HDL 71.10 05/28/2013   LDLDIRECT 143.2 05/28/2013   LDLCALC 133* 06/19/2012   ALT 11 05/28/2013   AST 20 05/28/2013   NA 142 05/28/2013   K 3.4* 05/28/2013   CL 103 05/28/2013   CREATININE 1.1 05/28/2013   BUN 14 05/28/2013   CO2 31 05/28/2013   TSH 1.13 05/28/2013   INR 0.96 03/09/2009   HGBA1C 5.8 02/02/2010   MICROALBUR 0.6 02/02/2010

## 2013-10-17 NOTE — Progress Notes (Signed)
Pre visit review using our clinic review tool, if applicable. No additional management support is needed unless otherwise documented below in the visit note. 

## 2013-10-17 NOTE — Progress Notes (Signed)
   Subjective:    Patient ID: [name], female    DOB: [birth year], 68 y.o.   MRN: [id number]  HPI  Here to f/u, has been on stable BP med regimen several yrs, without change recently in wt, diet, activity, and Denies worsening reflux, abd pain, dysphagia, n/v, bowel change or blood.  Pt denies fever, wt loss, night sweats, loss of appetite, or other constitutional symptoms.  Pt denies chest pain, increased sob or doe, wheezing, orthopnea, PND, increased LE swelling, palpitations,  Syncope but c/o 1 wk dizziness, and home BP has been as low as 95 sbp/ Denies worsening depressive symptoms, suicidal ideation, or panic; Past Medical History  Diagnosis Date  . Hypertension   . COPD (chronic obstructive pulmonary disease) 05/23/2012  . Hyperlipidemia 05/23/2012    T. Chol 234, LDL 130    Past Surgical History  Procedure Laterality Date  . Lung biopsy      left side, not cancerous  . Cervical spine surgery    . Vaginal hysterectomy      reports that she has quit smoking. She has never used smokeless tobacco. She reports that she drinks alcohol. She reports that she does not use illicit drugs. family history includes Hypertension in her sister; Throat cancer in her father. There is no history of Colon cancer or Stomach cancer. Allergies  Allergen Reactions  . Ace Inhibitors     REACTION: angioedema  . Tetanus Toxoid     Cellulitis in arm   Current Outpatient Prescriptions on File Prior to Visit  Medication Sig Dispense Refill  . aspirin EC 81 MG tablet Take 81 mg by mouth daily.      [name] atorvastatin (LIPITOR) 10 MG tablet Take 1 tablet (10 mg total) by mouth daily.  90 tablet  3  . venlafaxine (EFFEXOR) 37.5 MG tablet Take 37.5 mg by mouth daily.       No current facility-administered medications on file prior to visit.   Review of Systems  Constitutional: Negative for unusual diaphoresis or other sweats  HENT: Negative for ringing in ear Eyes: Negative for double vision or  worsening visual disturbance.  Respiratory: Negative for choking and stridor.   Gastrointestinal: Negative for vomiting or other signifcant bowel change Genitourinary: Negative for hematuria or decreased urine volume.  Musculoskeletal: Negative for other MSK pain or swelling Skin: Negative for color change and worsening wound.  Neurological: Negative for tremors and numbness other than noted  Psychiatric/Behavioral: Negative for decreased concentration or agitation other than above       Objective:   Physical Exam BP 100/68  Pulse 76  Temp(Src) 98.8 F (37.1 C) (Oral)  Ht 5\' 3"  (1.6 m)  Wt 153 lb (69.4 kg)  BMI 27.11 kg/m2  SpO2 97% VS noted,  Constitutional: Pt appears well-developed, well-nourished.  HENT: Head: NCAT.  Right Ear: External ear normal.  Left Ear: External ear normal.  Eyes: . Pupils are equal, round, and reactive to light. Conjunctivae and EOM are normal Neck: Normal range of motion. Neck supple.  Cardiovascular: Normal rate and regular rhythm.   Pulmonary/Chest: Effort normal and breath sounds normal.  Neurological: Pt is alert. Not confused , motor grossly intact Skin: Skin is warm. No rash Psychiatric: Pt behavior is normal. No agitation. not depressed affect    Assessment & Plan:

## 2013-10-17 NOTE — Assessment & Plan Note (Addendum)
overcontrolled and symtpomatic, to d/c the hct, also decr the amlod to 5 qd,  to f/u any worsening symptoms or concerns  BP Readings from Last 3 Encounters:  10/17/13 100/68  05/28/13 112/70  07/09/12 109/47

## 2013-10-17 NOTE — Patient Instructions (Addendum)
Ok to stop the HCT fluid pill and the 10 mg amlodipine  Please take all new medication as prescribed - the 5 mg amlodipine  Please call if your blood pressure remains low, or if your blood pressure increases over 140 consistently after about 1 wk of the medication changes  Please continue all other medications as before, and refills have been done if requested.  Please have the pharmacy call with any other refills you may need.  Please continue your efforts at being more active, low cholesterol diet, and weight control.  Please keep your appointments with your specialists as you may have planned  Please return in 6 months (after May 09 2014), or sooner if needed, with Lab testing done 3-5 days before

## 2013-10-17 NOTE — Assessment & Plan Note (Signed)
stable overall by history and exam, recent data reviewed with pt, and pt to continue medical treatment as before,  to f/u any worsening symptoms or concerns SpO2 Readings from Last 3 Encounters:  10/17/13 97%  05/28/13 97%  07/09/12 100%

## 2014-02-10 ENCOUNTER — Other Ambulatory Visit: Payer: Self-pay

## 2014-02-10 DIAGNOSIS — Z1239 Encounter for other screening for malignant neoplasm of breast: Secondary | ICD-10-CM

## 2014-02-21 ENCOUNTER — Ambulatory Visit
Admission: RE | Admit: 2014-02-21 | Discharge: 2014-02-21 | Disposition: A | Discharge status: Home / Self Care | Payer: Commercial Managed Care - HMO | Source: Ambulatory Visit

## 2014-02-21 ENCOUNTER — Other Ambulatory Visit: Payer: Self-pay

## 2014-02-21 DIAGNOSIS — Z1239 Encounter for other screening for malignant neoplasm of breast: Secondary | ICD-10-CM

## 2014-02-21 DIAGNOSIS — Z1231 Encounter for screening mammogram for malignant neoplasm of breast: Secondary | ICD-10-CM

## 2014-02-24 LAB — HM MAMMOGRAPHY

## 2014-02-25 ENCOUNTER — Ambulatory Visit (INDEPENDENT_AMBULATORY_CARE_PROVIDER_SITE_OTHER): Payer: Commercial Managed Care - HMO | Admitting: Internal Medicine

## 2014-02-25 ENCOUNTER — Ambulatory Visit (INDEPENDENT_AMBULATORY_CARE_PROVIDER_SITE_OTHER)
Admission: RE | Admit: 2014-02-25 | Discharge: 2014-02-25 | Disposition: A | Discharge status: Home / Self Care | Payer: Commercial Managed Care - HMO | Source: Ambulatory Visit | Attending: Internal Medicine | Admitting: Internal Medicine

## 2014-02-25 ENCOUNTER — Encounter: Payer: Self-pay | Admitting: Internal Medicine

## 2014-02-25 VITALS — BP 110/70 | HR 71 | Temp 98.6°F | Wt 162.0 lb

## 2014-02-25 DIAGNOSIS — K219 Gastro-esophageal reflux disease without esophagitis: Secondary | ICD-10-CM

## 2014-02-25 DIAGNOSIS — R0789 Other chest pain: Secondary | ICD-10-CM

## 2014-02-25 DIAGNOSIS — I10 Essential (primary) hypertension: Secondary | ICD-10-CM

## 2014-02-25 MED ORDER — GABAPENTIN 100 MG PO CAPS: 100.0000 mg | ORAL_CAPSULE | Freq: Three times a day (TID) | ORAL | Status: DC

## 2014-02-25 MED ORDER — PANTOPRAZOLE SODIUM 40 MG PO TBEC: 40.0000 mg | DELAYED_RELEASE_TABLET | Freq: Every day | ORAL | Status: DC

## 2014-02-25 NOTE — Progress Notes (Signed)
Pre visit review using our clinic review tool, if applicable. No additional management support is needed unless otherwise documented below in the visit note. 

## 2014-02-25 NOTE — Patient Instructions (Addendum)
Please take all new medication as prescribed- the gabapentin at 100 mg three times per day, and the generic protonix 40 mg per day  If the gabapentin helps somewhat, we could increase the dose to 300 mg to work better  If the gabapentin helps, but pain persists, we could refer you to Pain Specialist for nerve block  Please continue all other medications as before, and refills have been done if requested.  Please have the pharmacy call with any other refills you may need.  Please keep your appointments with your specialists as you may have planned  Please go to the XRAY Department in the Basement (go straight as you get off the elevator) for the x-ray testing  You will be contacted by phone if any changes need to be made immediately.  Otherwise, you will receive a letter about your results with an explanation, but please check with MyChart first.  Please remember to sign up for MyChart if you have not done so, as this will be important to you in the future with finding out test results, communicating by private email, and scheduling acute appointments online when needed.

## 2014-02-25 NOTE — Progress Notes (Signed)
Subjective:    Patient ID: [name], female    DOB: 1945-06-28, 68 y.o.   MRN: [id number]  HPI    Here to f/u, c/o 2 months or more right lateral burning type intermittent CP, mild to mod but wont go away may start at mid thoracic area but not clear, not pleuritic or positional, nothing seems to make better or worse.  Also has second type of discomfort to the lower mid chest, burning as well , mild, intermittent, assoc with occas sour brash, but Pt denies increased sob or doe, wheezing, orthopnea, PND, increased LE swelling, palpitations, dizziness or syncope.  Denies worsening abd pain, dysphagia, n/v, bowel change or blood.   Past Medical History  Diagnosis Date  . Hypertension   . COPD (chronic obstructive pulmonary disease) 05/23/2012  . Hyperlipidemia 05/23/2012    T. Chol 234, LDL 130    Past Surgical History  Procedure Laterality Date  . Lung biopsy      left side, not cancerous  . Cervical spine surgery    . Vaginal hysterectomy      reports that she has quit smoking. She has never used smokeless tobacco. She reports that she drinks alcohol. She reports that she does not use illicit drugs. family history includes Hypertension in her sister; Throat cancer in her father. There is no history of Colon cancer or Stomach cancer. Allergies  Allergen Reactions  . Ace Inhibitors     REACTION: angioedema  . Tetanus Toxoid     Cellulitis in arm   Current Outpatient Prescriptions on File Prior to Visit  Medication Sig Dispense Refill  . amLODipine (NORVASC) 5 MG tablet Take 1 tablet (5 mg total) by mouth daily.  90 tablet  3  . aspirin EC 81 MG tablet Take 81 mg by mouth daily.      [name] atorvastatin (LIPITOR) 10 MG tablet Take 1 tablet (10 mg total) by mouth daily.  90 tablet  3  . venlafaxine (EFFEXOR) 37.5 MG tablet Take 37.5 mg by mouth daily.       No current facility-administered medications on file prior to visit.   Review of Systems  Constitutional: Negative for  unusual diaphoresis or other sweats  HENT: Negative for ringing in ear Eyes: Negative for double vision or worsening visual disturbance.  Respiratory: Negative for choking and stridor.   Gastrointestinal: Negative for vomiting or other signifcant bowel change Genitourinary: Negative for hematuria or decreased urine volume.  Musculoskeletal: Negative for other MSK pain or swelling Skin: Negative for color change and worsening wound.  Neurological: Negative for tremors and numbness other than noted  Psychiatric/Behavioral: Negative for decreased concentration or agitation other than above       Objective:   Physical Exam BP 110/70  Pulse 71  Temp(Src) 98.6 F (37 C) (Oral)  Wt 162 lb (73.483 kg)  SpO2 98% VS noted, not ill appearing Constitutional: Pt appears well-developed, well-nourished.  HENT: Head: NCAT.  Right Ear: External ear normal.  Left Ear: External ear normal.  Eyes: . Pupils are equal, round, and reactive to light. Conjunctivae and EOM are normal Neck: Normal range of motion. Neck supple.  Cardiovascular: Normal rate and regular rhythm.   No chest wall tender, no red/tender/swelling or skin change right chest, no point tenderness Pulmonary/Chest: Effort normal and breath sounds normal.  Abd:  Soft, NT, ND, + BS except mild epigastric tender, without guarding or rebound Neurological: Pt is alert. Not confused , motor grossly intact Skin: Skin  is warm. No rash Psychiatric: Pt behavior is normal. No agitation.     Assessment & Plan:

## 2014-02-28 ENCOUNTER — Telehealth: Payer: Self-pay | Admitting: Internal Medicine

## 2014-02-28 NOTE — Telephone Encounter (Signed)
Patient is requesting results of xray

## 2014-03-01 NOTE — Telephone Encounter (Signed)
LVM for pt to call back.

## 2014-03-01 NOTE — Telephone Encounter (Signed)
Pt informed of results.

## 2014-03-03 NOTE — Assessment & Plan Note (Signed)
stable overall by history and exam, recent data reviewed with pt, and pt to continue medical treatment as before,  to f/u any worsening symptoms or concerns BP Readings from Last 3 Encounters:  02/25/14 110/70  10/17/13 100/68  05/28/13 112/70

## 2014-03-03 NOTE — Assessment & Plan Note (Addendum)
Right lateral, suspect neuritic pain, for cxr, also low dose gabapentin trial, consider increase dose if helps and not controlled, consider pain clinic for consideration nerve block

## 2014-03-03 NOTE — Assessment & Plan Note (Signed)
Current prilosec not working well, for change to protonix 40 qd

## 2014-05-08 ENCOUNTER — Encounter: Payer: Self-pay | Admitting: Internal Medicine

## 2014-05-08 ENCOUNTER — Ambulatory Visit: Payer: Commercial Managed Care - HMO | Admitting: Internal Medicine

## 2014-05-08 VITALS — BP 130/68 | HR 71 | Temp 99.4°F | Resp 14 | Ht 63.0 in | Wt 164.2 lb

## 2014-05-08 DIAGNOSIS — J310 Chronic rhinitis: Secondary | ICD-10-CM

## 2014-05-08 DIAGNOSIS — J209 Acute bronchitis, unspecified: Secondary | ICD-10-CM

## 2014-05-08 MED ORDER — HYDROCODONE-HOMATROPINE 5-1.5 MG/5ML PO SYRP: 5.0000 mL | ORAL_SOLUTION | Freq: Four times a day (QID) | ORAL | Status: DC | PRN

## 2014-05-08 MED ORDER — AZITHROMYCIN 250 MG PO TABS: ORAL_TABLET | ORAL | Status: DC

## 2014-05-08 NOTE — Patient Instructions (Signed)
Plain Mucinex (NOT D) for thick secretions ;force NON dairy fluids .   Nasal cleansing in the shower as discussed with lather of mild shampoo.After 10 seconds wash off lather while  exhaling through nostrils. Make sure that all residual soap is removed to prevent irritation.  Flonase OR Nasacort AQ 1 spray in each nostril twice a day as needed. Use the "crossover" technique into opposite nostril spraying toward opposite ear @ 45 degree angle, not straight up into nostril.  Plain Allegra (NOT D )  160 daily , Loratidine 10 mg , OR Zyrtec 10 mg @ bedtime  as needed for itchy eyes & sneezing  .Zicam Melts or Zinc lozenges as per package label for sore throat . Complementary options include  vitamin C 2000 mg daily; & Echinacea for 4-7 days.

## 2014-05-08 NOTE — Progress Notes (Signed)
   Subjective:    Patient ID: [name], female    DOB: 06-Apr-1946, 68 y.o.   MRN: [id number]  HPI  Her symptoms began 05/05/14 as sore throat and head congestion. As of 12/29 in the evenings she began to have a nonproductive cough. She's had temperature up to 101 as of last night as well as chills and sweats  She has persistent sore throat in the context of postnasal drainage  She's also having some myalgias and arthralgias with the  fever.  She's taken Tylenol &  Mucinex with only partial response  She had been exposed to sick family and friends.  Review of Systems  She denies maxillary sinus area pain, dental pain, nasal purulence, otic pain, otic discharge  The cough is not associated with shortness of breath or wheezing  She does not have extrinsic symptoms of itchy, watery eyes, sneezing.      Objective:   Physical Exam  General appearance:good health ;well nourished; no acute distress or increased work of breathing is present.  No  lymphadenopathy about the head, neck, or axilla noted.   Eyes: No conjunctival inflammation or lid edema is present. There is no scleral icterus.  Ears:  External ear exam shows no significant lesions or deformities.  Otoscopic examination reveals clear canals, tympanic membranes are intact bilaterally without bulging, retraction, inflammation or discharge.  Nose:  External nasal examination shows no deformity or inflammation. Nasal mucosa are pink and moist without lesions or exudates. No septal dislocation or deviation.No obstruction to airflow.   Oral exam: Upper plate; lips and gums are healthy appearing.There is no oropharyngeal erythema or exudate noted.   Neck:  No deformities, thyromegaly, masses, or tenderness noted.   Supple with full range of motion without pain.   Heart:  Normal rate and regular rhythm. S1 and S2 normal without gallop, murmur, click, rub or other extra sounds.   Lungs:Chest clear to auscultation; no  wheezes, rhonchi,rales ,or rubs present.No increased work of breathing.    Extremities:  No cyanosis, edema, or clubbing  noted    Skin: Warm & dry w/o tenting.      Assessment & Plan:  #1 acute bronchitis w/o bronchospasm #2 rhinitis with sore throat Plan: See orders and recommendations

## 2014-05-08 NOTE — Progress Notes (Signed)
Pre visit review using our clinic review tool, if applicable. No additional management support is needed unless otherwise documented below in the visit note. 

## 2014-06-03 ENCOUNTER — Other Ambulatory Visit (INDEPENDENT_AMBULATORY_CARE_PROVIDER_SITE_OTHER): Payer: Commercial Managed Care - HMO

## 2014-06-03 ENCOUNTER — Ambulatory Visit (INDEPENDENT_AMBULATORY_CARE_PROVIDER_SITE_OTHER): Payer: Commercial Managed Care - HMO | Admitting: Internal Medicine

## 2014-06-03 ENCOUNTER — Encounter: Payer: Self-pay | Admitting: Internal Medicine

## 2014-06-03 VITALS — BP 130/78 | HR 75 | Temp 98.7°F | Ht 63.0 in | Wt 164.0 lb

## 2014-06-03 DIAGNOSIS — Z0000 Encounter for general adult medical examination without abnormal findings: Secondary | ICD-10-CM

## 2014-06-03 DIAGNOSIS — Z23 Encounter for immunization: Secondary | ICD-10-CM | POA: Diagnosis not present

## 2014-06-03 DIAGNOSIS — E785 Hyperlipidemia, unspecified: Secondary | ICD-10-CM

## 2014-06-03 LAB — BASIC METABOLIC PANEL
BUN: 15 mg/dL (ref 6–23)
CALCIUM: 9 mg/dL (ref 8.4–10.5)
CO2: 29 meq/L (ref 19–32)
Chloride: 106 mEq/L (ref 96–112)
Creatinine, Ser: 0.79 mg/dL (ref 0.40–1.20)
GFR: 92.33 mL/min (ref >60.00)
Glucose, Bld: 92 mg/dL (ref 70–99)
Potassium: 4.3 mEq/L (ref 3.5–5.1)
SODIUM: 141 meq/L (ref 135–145)

## 2014-06-03 LAB — URINALYSIS, ROUTINE W REFLEX MICROSCOPIC
Bilirubin Urine: NEGATIVE
Hgb urine dipstick: NEGATIVE
KETONES UR: NEGATIVE
Nitrite: NEGATIVE
RBC / HPF: NONE SEEN (ref 0–?)
Specific Gravity, Urine: 1.015 (ref 1.000–1.030)
TOTAL PROTEIN, URINE-UPE24: NEGATIVE
Urine Glucose: NEGATIVE
Urobilinogen, UA: 0.2 (ref 0.0–1.0)
pH: 6.5 (ref 5.0–8.0)

## 2014-06-03 LAB — LIPID PANEL
CHOL/HDL RATIO: 2
Cholesterol: 168 mg/dL (ref 0–200)
HDL: 67.3 mg/dL (ref >39.00)
LDL CALC: 87 mg/dL (ref 0–99)
NonHDL: 100.7
Triglycerides: 71 mg/dL (ref 0.0–149.0)
VLDL: 14.2 mg/dL (ref 0.0–40.0)

## 2014-06-03 LAB — HEPATIC FUNCTION PANEL
ALK PHOS: 114 U/L (ref 39–117)
ALT: 13 U/L (ref 0–35)
AST: 19 U/L (ref 0–37)
Albumin: 4 g/dL (ref 3.5–5.2)
BILIRUBIN TOTAL: 0.4 mg/dL (ref 0.2–1.2)
Bilirubin, Direct: 0.1 mg/dL (ref 0.0–0.3)
Total Protein: 7.3 g/dL (ref 6.0–8.3)

## 2014-06-03 LAB — CBC WITH DIFFERENTIAL/PLATELET
BASOS PCT: 1.6 % (ref 0.0–3.0)
Basophils Absolute: 0.1 10*3/uL (ref 0.0–0.1)
EOS PCT: 12.5 % — AB (ref 0.0–5.0)
Eosinophils Absolute: 0.5 10*3/uL (ref 0.0–0.7)
HEMATOCRIT: 37.6 % (ref 36.0–46.0)
HEMOGLOBIN: 12.5 g/dL (ref 12.0–15.0)
LYMPHS ABS: 1.1 10*3/uL (ref 0.7–4.0)
Lymphocytes Relative: 30.6 % (ref 12.0–46.0)
MCHC: 33.3 g/dL (ref 30.0–36.0)
MCV: 92 fl (ref 78.0–100.0)
MONO ABS: 0.4 10*3/uL (ref 0.1–1.0)
Monocytes Relative: 9.8 % (ref 3.0–12.0)
Neutro Abs: 1.7 10*3/uL (ref 1.4–7.7)
Neutrophils Relative %: 45.5 % (ref 43.0–77.0)
PLATELETS: 222 10*3/uL (ref 150.0–400.0)
RBC: 4.09 Mil/uL (ref 3.87–5.11)
RDW: 14.8 % (ref 11.5–15.5)
WBC: 3.8 10*3/uL — AB (ref 4.0–10.5)

## 2014-06-03 LAB — TSH: TSH: 1.5 u[IU]/mL (ref 0.35–4.50)

## 2014-06-03 NOTE — Progress Notes (Signed)
Pre visit review using our clinic review tool, if applicable. No additional management support is needed unless otherwise documented below in the visit note. 

## 2014-06-03 NOTE — Assessment & Plan Note (Addendum)

## 2014-06-03 NOTE — Addendum Note (Signed)
Addended by: Biagio Borg on: 06/03/2014 12:00 PM   Modules accepted: Miquel Dunn

## 2014-06-03 NOTE — Progress Notes (Signed)
Subjective:    Patient ID: [name], female    DOB: Jul 19, 1945, 69 y.o.   MRN: [id number]  HPI  Here for wellness and f/u;  Overall doing ok;  Pt denies CP, worsening SOB, DOE, wheezing, orthopnea, PND, worsening LE edema, palpitations, dizziness or syncope.  Pt denies neurological change such as new headache, facial or extremity weakness.  Pt denies polydipsia, polyuria, or low sugar symptoms. Pt states overall good compliance with treatment and medications, good tolerability, and has been trying to follow lower cholesterol diet.  Pt denies worsening depressive symptoms, suicidal ideation or panic. No fever, night sweats, wt loss, loss of appetite, or other constitutional symptoms.  Pt states good ability with ADL's, has low fall risk, home safety reviewed and adequate, no other significant changes in hearing or vision, and occasionally active with exercise with silver sneakers at the gym.  No new complaints Due for flu shot/prevnar.  Recent sinusitis symtpoms resolved Past Medical History  Diagnosis Date  . Hypertension   . COPD (chronic obstructive pulmonary disease) 05/23/2012  . Hyperlipidemia 05/23/2012    T. Chol 234, LDL 130    Past Surgical History  Procedure Laterality Date  . Lung biopsy      left side, not cancerous  . Cervical spine surgery    . Vaginal hysterectomy      reports that she has quit smoking. She has never used smokeless tobacco. She reports that she drinks alcohol. She reports that she does not use illicit drugs. family history includes Hypertension in her sister; Throat cancer in her father. There is no history of Colon cancer or Stomach cancer. Allergies  Allergen Reactions  . Ace Inhibitors     REACTION: angioedema  . Tetanus Toxoid     Cellulitis in arm   Current Outpatient Prescriptions on File Prior to Visit  Medication Sig Dispense Refill  . amLODipine (NORVASC) 5 MG tablet Take 1 tablet (5 mg total) by mouth daily. 90 tablet 3  . aspirin EC  81 MG tablet Take 81 mg by mouth daily.    [name] atorvastatin (LIPITOR) 10 MG tablet Take 1 tablet (10 mg total) by mouth daily. 90 tablet 3  . gabapentin (NEURONTIN) 100 MG capsule Take 1 capsule (100 mg total) by mouth 3 (three) times daily. 90 capsule 3  . pantoprazole (PROTONIX) 40 MG tablet Take 1 tablet (40 mg total) by mouth daily. 90 tablet 3  . venlafaxine (EFFEXOR) 37.5 MG tablet Take 37.5 mg by mouth daily.     No current facility-administered medications on file prior to visit.   Review of Systems Constitutional: Negative for increased diaphoresis, other activity, appetite or other siginficant weight change  HENT: Negative for worsening hearing loss, ear pain, facial swelling, mouth sores and neck stiffness.   Eyes: Negative for other worsening pain, redness or visual disturbance.  Respiratory: Negative for shortness of breath and wheezing.   Cardiovascular: Negative for chest pain and palpitations.  Gastrointestinal: Negative for diarrhea, blood in stool, abdominal distention or other pain Genitourinary: Negative for hematuria, flank pain or change in urine volume.  Musculoskeletal: Negative for myalgias or other joint complaints.  Skin: Negative for color change and wound.  Neurological: Negative for syncope and numbness. other than noted Hematological: Negative for adenopathy. or other swelling Psychiatric/Behavioral: Negative for hallucinations, self-injury, decreased concentration or other worsening agitation.      Objective:   Physical Exam BP 130/78 mmHg  Pulse 75  Temp(Src) 98.7 F (37.1 C) (Oral)  Ht 5\' 3"  (1.6 m)  Wt 164 lb (74.39 kg)  BMI 29.06 kg/m2  SpO2 95% VS noted,  Constitutional: Pt is oriented to person, place, and time. Appears well-developed and well-nourished.  Head: Normocephalic and atraumatic.  Right Ear: External ear normal.  Left Ear: External ear normal.  Nose: Nose normal.  Mouth/Throat: Oropharynx is clear and moist.  Eyes: Conjunctivae  and EOM are normal. Pupils are equal, round, and reactive to light.  Neck: Normal range of motion. Neck supple. No JVD present. No tracheal deviation present.  Cardiovascular: Normal rate, regular rhythm, normal heart sounds and intact distal pulses.   Pulmonary/Chest: Effort normal and breath sounds without rales or wheezing  Abdominal: Soft. Bowel sounds are normal. NT. No HSM  Musculoskeletal: Normal range of motion. Exhibits no edema.  Lymphadenopathy:  Has no cervical adenopathy.  Neurological: Pt is alert and oriented to person, place, and time. Pt has normal reflexes. No cranial nerve deficit. Motor grossly intact Skin: Skin is warm and dry. No rash noted.  Psychiatric:  Has normal mood and affect. Behavior is normal.     Assessment & Plan:

## 2014-06-03 NOTE — Patient Instructions (Addendum)
You had the flu shot today  Please return in 2 weeks for the new Prevnar pneumonia shot (for a Nurse Visit)  Your EKG was OK  Please continue all other medications as before, and refills have been done if requested.  Please have the pharmacy call with any other refills you may need.  Please continue your efforts at being more active, low cholesterol diet, and weight control.  You are otherwise up to date with prevention measures today.  Please keep your appointments with your specialists as you may have planned  Please go to the LAB in the Basement (turn left off the elevator) for the tests to be done today  You will be contacted by phone if any changes need to be made immediately.  Otherwise, you will receive a letter about your results with an explanation, but please check with MyChart first.  Please remember to sign up for MyChart if you have not done so, as this will be important to you in the future with finding out test results, communicating by private email, and scheduling acute appointments online when needed.  Please return in 1 year for your yearly visit, or sooner if needed, with Lab testing done 3-5 days before

## 2014-06-04 ENCOUNTER — Telehealth: Payer: Self-pay | Admitting: Internal Medicine

## 2014-06-04 MED ORDER — CEPHALEXIN 500 MG PO CAPS: 500.0000 mg | ORAL_CAPSULE | Freq: Four times a day (QID) | ORAL | Status: DC

## 2014-06-04 NOTE — Telephone Encounter (Signed)
rx done

## 2014-06-04 NOTE — Telephone Encounter (Signed)
-----   Message from [place] sent at 06/03/2014  5:13 PM EST ----- Called the patient and she has been going more frequently.  Please send in script to [facility]

## 2014-06-19 ENCOUNTER — Ambulatory Visit (INDEPENDENT_AMBULATORY_CARE_PROVIDER_SITE_OTHER): Payer: Commercial Managed Care - HMO

## 2014-06-19 DIAGNOSIS — Z23 Encounter for immunization: Secondary | ICD-10-CM

## 2014-06-26 ENCOUNTER — Telehealth: Payer: Self-pay | Admitting: Internal Medicine

## 2014-06-26 NOTE — Telephone Encounter (Signed)
Pt called in and just wanted to put in her chat that she had a reaction to pnemonia shot.     Red rash  Knot (sore and itches) A fever for a couple days    She just wanted this noted in her chart

## 2014-06-26 NOTE — Telephone Encounter (Signed)
Record updated in allergy section

## 2014-07-30 ENCOUNTER — Other Ambulatory Visit: Payer: Self-pay | Admitting: Internal Medicine

## 2014-08-01 ENCOUNTER — Other Ambulatory Visit: Payer: Self-pay | Admitting: Internal Medicine

## 2014-09-12 ENCOUNTER — Encounter: Payer: Self-pay | Admitting: Internal Medicine

## 2014-10-07 ENCOUNTER — Encounter: Payer: Self-pay | Admitting: Family

## 2014-10-07 ENCOUNTER — Ambulatory Visit (INDEPENDENT_AMBULATORY_CARE_PROVIDER_SITE_OTHER): Payer: Commercial Managed Care - HMO | Admitting: Family

## 2014-10-07 VITALS — BP 142/82 | HR 67 | Temp 98.1°F | Resp 18 | Ht 63.0 in | Wt 171.8 lb

## 2014-10-07 DIAGNOSIS — R600 Localized edema: Secondary | ICD-10-CM | POA: Diagnosis not present

## 2014-10-07 DIAGNOSIS — F341 Dysthymic disorder: Secondary | ICD-10-CM

## 2014-10-07 MED ORDER — BUPROPION HCL ER (SR) 100 MG PO TB12: ORAL_TABLET | ORAL | Status: DC

## 2014-10-07 NOTE — Assessment & Plan Note (Signed)
Symptoms and exam consistent with major depressive disorder and anxiety. Previously on Effexor for hot flashes. Continue current dosage of Effexor. Start Wellbutrin. Denies any suicidal ideations. Follow-up in one month or sooner if symptoms worsen.

## 2014-10-07 NOTE — Assessment & Plan Note (Addendum)
No edema observed today. May possibly be related side effect of amlodipine. Treat conservatively at this time with elevation, decreasing salt in diet, and compression socks as needed If it continues to be an issue, recommend discontinuing amlodipine and starting new blood pressure medication such as hydrochlorothiazide. Follow-up if symptoms worsen.

## 2014-10-07 NOTE — Patient Instructions (Signed)
Thank you for choosing [facility].  Summary/Instructions:  Keep your legs elevated as much as possible, decrease salt in your diet and try compression as needed.  Continue to take your medications as prescribed.   Your prescription(s) have been submitted to your pharmacy or been printed and provided for you. Please take as directed and contact our office if you believe you are having problem(s) with the medication(s) or have any questions.  If your symptoms worsen or fail to improve, please contact our office for further instruction, or in case of emergency go directly to the emergency room at the closest medical facility.    Depression Depression refers to feeling sad, low, down in the dumps, blue, gloomy, or empty. In general, there are two kinds of depression: 1. Normal sadness or normal grief. This kind of depression is one that we all feel from time to time after upsetting life experiences, such as the loss of a job or the ending of a relationship. This kind of depression is considered normal, is short lived, and resolves within a few days to 2 weeks. Depression experienced after the loss of a loved one (bereavement) often lasts longer than 2 weeks but normally gets better with time. 2. Clinical depression. This kind of depression lasts longer than normal sadness or normal grief or interferes with your ability to function at home, at work, and in school. It also interferes with your personal relationships. It affects almost every aspect of your life. Clinical depression is an illness. Symptoms of depression can also be caused by conditions other than those mentioned above, such as:  Physical illness. Some physical illnesses, including underactive thyroid gland (hypothyroidism), severe anemia, specific types of cancer, diabetes, uncontrolled seizures, heart and lung problems, strokes, and chronic pain are commonly associated with symptoms of depression.  Side effects of some  prescription medicine. In some people, certain types of medicine can cause symptoms of depression.  Substance abuse. Abuse of alcohol and illicit drugs can cause symptoms of depression. SYMPTOMS Symptoms of normal sadness and normal grief include the following:  Feeling sad or crying for short periods of time.  Not caring about anything (apathy).  Difficulty sleeping or sleeping too much.  No longer able to enjoy the things you used to enjoy.  Desire to be by oneself all the time (social isolation).  Lack of energy or motivation.  Difficulty concentrating or remembering.  Change in appetite or weight.  Restlessness or agitation. Symptoms of clinical depression include the same symptoms of normal sadness or normal grief and also the following symptoms:  Feeling sad or crying all the time.  Feelings of guilt or worthlessness.  Feelings of hopelessness or helplessness.  Thoughts of suicide or the desire to harm yourself (suicidal ideation).  Loss of touch with reality (psychotic symptoms). Seeing or hearing things that are not real (hallucinations) or having false beliefs about your life or the people around you (delusions and paranoia). DIAGNOSIS  The diagnosis of clinical depression is usually based on how bad the symptoms are and how long they have lasted. Your health care provider will also ask you questions about your medical history and substance use to find out if physical illness, use of prescription medicine, or substance abuse is causing your depression. Your health care provider may also order blood tests. TREATMENT  Often, normal sadness and normal grief do not require treatment. However, sometimes antidepressant medicine is given for bereavement to ease the depressive symptoms until they resolve. The treatment for  clinical depression depends on how bad the symptoms are but often includes antidepressant medicine, counseling with a mental health professional, or both.  Your health care provider will help to determine what treatment is best for you. Depression caused by physical illness usually goes away with appropriate medical treatment of the illness. If prescription medicine is causing depression, talk with your health care provider about stopping the medicine, decreasing the dose, or changing to another medicine. Depression caused by the abuse of alcohol or illicit drugs goes away when you stop using these substances. Some adults need professional help in order to stop drinking or using drugs. SEEK IMMEDIATE MEDICAL CARE IF:  You have thoughts about hurting yourself or others.  You lose touch with reality (have psychotic symptoms).  You are taking medicine for depression and have a serious side effect. FOR MORE INFORMATION  National Alliance on Mental Illness: www.nami.[employer]: https://carter.com/ Document Released: 04/22/2000 Document Revised: 09/09/2013 Document Reviewed: 07/25/2011 [facility] Patient Information 2015 [place], [place]. This information is not intended to replace advice given to you by your health care provider. Make sure you discuss any questions you have with your health care provider.  Bupropion extended-release tablets (Depression/Mood Disorders) What is this medicine? BUPROPION (byoo PROE pee on) is used to treat depression. This medicine may be used for other purposes; ask your health care provider or pharmacist if you have questions. COMMON BRAND NAME(S): Aplenzin, Budeprion XL, Forfivo XL, Wellbutrin XL What should I tell my health care provider before I take this medicine? They need to know if you have any of these conditions: -an eating disorder, such as anorexia or bulimia -bipolar disorder or psychosis -diabetes or high blood sugar, treated with medication -glaucoma -head injury or brain tumor -heart disease, previous heart attack, or irregular heart beat -high blood pressure -kidney or  liver disease -seizures (convulsions) -suicidal thoughts or a previous suicide attempt -Tourette's syndrome -weight loss -an unusual or allergic reaction to bupropion, other medicines, foods, dyes, or preservatives -breast-feeding -pregnant or trying to become pregnant How should I use this medicine? Take this medicine by mouth with a glass of water. Follow the directions on the prescription label. You can take it with or without food. If it upsets your stomach, take it with food. Do not crush, chew, or cut these tablets. This medicine is taken once daily at the same time each day. Do not take your medicine more often than directed. Do not stop taking this medicine suddenly except upon the advice of your doctor. Stopping this medicine too quickly may cause serious side effects or your condition may worsen. A special MedGuide will be given to you by the pharmacist with each prescription and refill. Be sure to read this information carefully each time. Talk to your pediatrician regarding the use of this medicine in children. Special care may be needed. Overdosage: If you think you have taken too much of this medicine contact a poison control center or emergency room at once. NOTE: This medicine is only for you. Do not share this medicine with others. What if I miss a dose? If you miss a dose, skip the missed dose and take your next tablet at the regular time. Do not take double or extra doses. What may interact with this medicine? Do not take this medicine with any of the following medications: -linezolid -MAOIs like Azilect, Carbex, Eldepryl, Marplan, Nardil, and Parnate -methylene blue (injected into a vein) -other medicines that contain bupropion like Zyban  This medicine may also interact with the following medications: -alcohol -certain medicines for anxiety or sleep -certain medicines for blood pressure like metoprolol, propranolol -certain medicines for depression or psychotic  disturbances -certain medicines for HIV or AIDS like efavirenz, lopinavir, nelfinavir, ritonavir -certain medicines for irregular heart beat like propafenone, flecainide -certain medicines for Parkinson's disease like amantadine, levodopa -certain medicines for seizures like carbamazepine, phenytoin, phenobarbital -cimetidine -clopidogrel -cyclophosphamide -furazolidone -isoniazid -nicotine -orphenadrine -procarbazine -steroid medicines like prednisone or cortisone -stimulant medicines for attention disorders, weight loss, or to stay awake -tamoxifen -theophylline -thiotepa -ticlopidine -tramadol -warfarin This list may not describe all possible interactions. Give your health care provider a list of all the medicines, herbs, non-prescription drugs, or dietary supplements you use. Also tell them if you smoke, drink alcohol, or use illegal drugs. Some items may interact with your medicine. What should I watch for while using this medicine? Tell your doctor if your symptoms do not get better or if they get worse. Visit your doctor or health care professional for regular checks on your progress. Because it may take several weeks to see the full effects of this medicine, it is important to continue your treatment as prescribed by your doctor. Patients and their families should watch out for new or worsening thoughts of suicide or depression. Also watch out for sudden changes in feelings such as feeling anxious, agitated, panicky, irritable, hostile, aggressive, impulsive, severely restless, overly excited and hyperactive, or not being able to sleep. If this happens, especially at the beginning of treatment or after a change in dose, call your health care professional. Avoid alcoholic drinks while taking this medicine. Drinking large amounts of alcoholic beverages, using sleeping or anxiety medicines, or quickly stopping the use of these agents while taking this medicine may increase your risk for  a seizure. Do not drive or use heavy machinery until you know how this medicine affects you. This medicine can impair your ability to perform these tasks. Do not take this medicine close to bedtime. It may prevent you from sleeping. Your mouth may get dry. Chewing sugarless gum or sucking hard candy, and drinking plenty of water may help. Contact your doctor if the problem does not go away or is severe. The tablet shell for some brands of this medicine does not dissolve. This is normal. The tablet shell may appear whole in the stool. This is not a cause for concern. What side effects may I notice from receiving this medicine? Side effects that you should report to your doctor or health care professional as soon as possible: -allergic reactions like skin rash, itching or hives, swelling of the face, lips, or tongue -breathing problems -changes in vision -confusion -fast or irregular heartbeat -hallucinations -increased blood pressure -redness, blistering, peeling or loosening of the skin, including inside the mouth -seizures -suicidal thoughts or other mood changes -unusually weak or tired -vomiting Side effects that usually do not require medical attention (report to your doctor or health care professional if they continue or are bothersome): -change in sex drive or performance -constipation -headache -loss of appetite -nausea -tremors -weight loss This list may not describe all possible side effects. Call your doctor for medical advice about side effects. You may report side effects to FDA at 1-800-FDA-1088. Where should I keep my medicine? Keep out of the reach of children. Store at room temperature between 15 and 30 degrees C (59 and 86 degrees F). Throw away any unused medicine after the expiration date. NOTE: This sheet is  a summary. It may not cover all possible information. If you have questions about this medicine, talk to your doctor, pharmacist, or health care provider.   2015, Elsevier/Gold Standard. (2012-11-16 12:39:42)

## 2014-10-07 NOTE — Progress Notes (Signed)
Pre visit review using our clinic review tool, if applicable. No additional management support is needed unless otherwise documented below in the visit note. 

## 2014-10-07 NOTE — Progress Notes (Signed)
Subjective:    Patient ID: [name], female    DOB: [birth year], 69 y.o.   MRN: [id number]  Chief Complaint  Patient presents with  . Depression    x2 months on and off feet have been swelling, mostly on the right side, also been depressed, don't feel like doing anything, can't bring herself to get up and do things around the house, emotional, having trouble sleeping feels like she can't shut her brain off    HPI:  [name] is a 69 y.o. female with a PMH of hypertension, COPD, GERD, diverticulosis, irritable bowel syndrome, and osteopenia who presents today for an office visit.  1.) Depression/Anxiety - Associated symptom of feeling sad, decreased energy, and lack of enthusiasm has been going on for about several months. Inability to sleep secondary to not being able to "shut her mind off." Modifying factors include Effexor which is being used for hot flashes. Has had similar incidence before when her mother passed away about 2 years ago. She faces a multifactorial amount of stress between income, paying bills and medication.   2.) Legs - Associated symptom of edema located in her bilateral lower extremity with the right being worse than the left has been going on for about 2-3 months. Modifying factors include elevating her legs which helps some. The symptoms tend to stay the same and wax and wane on occasion.  Allergies  Allergen Reactions  . Ace Inhibitors Anaphylaxis    REACTION: angioedema  . Pneumococcal Vaccines     Red rash  Knot (sore and itches) A fever for a couple days (per pt report)  . Tetanus Toxoid     Cellulitis in arm    Current Outpatient Prescriptions on File Prior to Visit  Medication Sig Dispense Refill  . amLODipine (NORVASC) 5 MG tablet Take 1 tablet (5 mg total) by mouth daily. 90 tablet 3  . aspirin EC 81 MG tablet Take 81 mg by mouth daily.    [name] atorvastatin (LIPITOR) 10 MG tablet take 1 tablet by mouth once daily 90 tablet 3  .  gabapentin (NEURONTIN) 100 MG capsule Take 1 capsule (100 mg total) by mouth 3 (three) times daily. 90 capsule 3  . pantoprazole (PROTONIX) 40 MG tablet Take 1 tablet (40 mg total) by mouth daily. 90 tablet 3  . venlafaxine (EFFEXOR) 37.5 MG tablet Take 37.5 mg by mouth daily.     No current facility-administered medications on file prior to visit.     Review of Systems  Constitutional: Negative for fever and chills.  Respiratory: Negative for chest tightness and shortness of breath.   Cardiovascular: Positive for leg swelling. Negative for chest pain and palpitations.  Psychiatric/Behavioral: Positive for sleep disturbance and dysphoric mood. The patient is nervous/anxious.       Objective:    BP 142/82 mmHg  Pulse 67  Temp(Src) 98.1 F (36.7 C) (Oral)  Resp 18  Ht 5\' 3"  (1.6 m)  Wt 171 lb 12.8 oz (77.928 kg)  BMI 30.44 kg/m2  SpO2 99% Nursing note and vital signs reviewed.  Physical Exam  Constitutional: She is oriented to person, place, and time. She appears well-developed and well-nourished. No distress.  Cardiovascular: Normal rate, regular rhythm, normal heart sounds and intact distal pulses.   No obvious edema noted bilaterally. Distal pulses are intact and appropriate.  Pulmonary/Chest: Effort normal and breath sounds normal.  Neurological: She is alert and oriented to person, place, and time.  Skin: Skin is warm  and dry.  Psychiatric: Her behavior is normal. Judgment and thought content normal. She exhibits a depressed mood.       Assessment & Plan:   Problem List Items Addressed This Visit      Other   DEPRESSION/ANXIETY - Primary    Symptoms and exam consistent with major depressive disorder and anxiety. Previously on Effexor for hot flashes. Continue current dosage of Effexor. Start Wellbutrin. Denies any suicidal ideations. Follow-up in one month or sooner if symptoms worsen.      Relevant Medications   buPROPion (WELLBUTRIN SR) 100 MG 12 hr tablet    Lower extremity edema    No edema observed today. May possibly be related side effect of amlodipine. Treat conservatively at this time with elevation, decreasing salt in diet, and compression socks as needed If it continues to be an issue, recommend discontinuing amlodipine and starting new blood pressure medication such as hydrochlorothiazide. Follow-up if symptoms worsen.

## 2014-10-22 ENCOUNTER — Other Ambulatory Visit: Payer: Self-pay | Admitting: Internal Medicine

## 2014-11-11 ENCOUNTER — Ambulatory Visit (INDEPENDENT_AMBULATORY_CARE_PROVIDER_SITE_OTHER): Payer: Commercial Managed Care - HMO | Admitting: Internal Medicine

## 2014-11-11 ENCOUNTER — Encounter: Payer: Self-pay | Admitting: Internal Medicine

## 2014-11-11 VITALS — BP 124/72 | HR 76 | Temp 98.7°F | Ht 63.0 in | Wt 171.0 lb

## 2014-11-11 DIAGNOSIS — I10 Essential (primary) hypertension: Secondary | ICD-10-CM | POA: Diagnosis not present

## 2014-11-11 DIAGNOSIS — Z0189 Encounter for other specified special examinations: Secondary | ICD-10-CM

## 2014-11-11 DIAGNOSIS — M545 Low back pain, unspecified: Secondary | ICD-10-CM

## 2014-11-11 DIAGNOSIS — Z0000 Encounter for general adult medical examination without abnormal findings: Secondary | ICD-10-CM

## 2014-11-11 DIAGNOSIS — F341 Dysthymic disorder: Secondary | ICD-10-CM | POA: Diagnosis not present

## 2014-11-11 NOTE — Progress Notes (Signed)
Subjective:    Patient ID: [name], female    DOB: 1945/12/28, 69 y.o.   MRN: [id number]  HPI  Here to f/u; overall doing ok,  Pt denies chest pain, increasing sob or doe, wheezing, orthopnea, PND, increased LE swelling, palpitations, dizziness or syncope.  Pt denies new neurological symptoms such as new headache, or facial or extremity weakness or numbness.  Pt denies polydipsia, polyuria, or low sugar episode.   Pt denies new neurological symptoms such as new headache, or facial or extremity weakness or numbness.   Pt states overall good compliance with meds, mostly trying to follow appropriate diet, with wt overall stable,  Pt denies worsening depressive symptoms, and no suicidal ideation, or panic.  Has overall fatigue, lack of motivation, and depessive symptoms improved with recent tx Pt also c/o mild left lateral 3 days LBP without change in severity, bowel or bladder change, fever, wt loss,  worsening LE pain/numbness/weakness, gait change or falls. Denies urinary symptoms such as dysuria, frequency, urgency, flank pain, hematuria or n/v, fever, chills. Past Medical History  Diagnosis Date  . Hypertension   . COPD (chronic obstructive pulmonary disease) 05/23/2012  . Hyperlipidemia 05/23/2012    T. Chol 234, LDL 130    Past Surgical History  Procedure Laterality Date  . Lung biopsy      left side, not cancerous  . Cervical spine surgery    . Vaginal hysterectomy      reports that she has quit smoking. She has never used smokeless tobacco. She reports that she drinks alcohol. She reports that she does not use illicit drugs. family history includes Hypertension in her sister; Throat cancer in her father. There is no history of Colon cancer or Stomach cancer. Allergies  Allergen Reactions  . Ace Inhibitors Anaphylaxis    REACTION: angioedema  . Pneumococcal Vaccines     Red rash  Knot (sore and itches) A fever for a couple days (per pt report)  . Tetanus Toxoid    Cellulitis in arm   Current Outpatient Prescriptions on File Prior to Visit  Medication Sig Dispense Refill  . amLODipine (NORVASC) 5 MG tablet take 1 tablet by mouth once daily 90 tablet 1  . aspirin EC 81 MG tablet Take 81 mg by mouth daily.    [name] atorvastatin (LIPITOR) 10 MG tablet take 1 tablet by mouth once daily 90 tablet 3  . gabapentin (NEURONTIN) 100 MG capsule Take 1 capsule (100 mg total) by mouth 3 (three) times daily. 90 capsule 3  . pantoprazole (PROTONIX) 40 MG tablet Take 1 tablet (40 mg total) by mouth daily. 90 tablet 3  . venlafaxine (EFFEXOR) 37.5 MG tablet Take 37.5 mg by mouth daily.     No current facility-administered medications on file prior to visit.     Review of Systems  Constitutional: Negative for unusual diaphoresis or night sweats HENT: Negative for ringing in ear or discharge Eyes: Negative for double vision or worsening visual disturbance.  Respiratory: Negative for choking and stridor.   Gastrointestinal: Negative for vomiting or other signifcant bowel change Genitourinary: Negative for hematuria or change in urine volume.  Musculoskeletal: Negative for other MSK pain or swelling Skin: Negative for color change and worsening wound.  Neurological: Negative for tremors and numbness other than noted  Psychiatric/Behavioral: Negative for decreased concentration or agitation other than above       Objective:   Physical Exam BP 124/72 mmHg  Pulse 76  Temp(Src) 98.7 F (37.1 C) (  Oral)  Ht 5\' 3"  (1.6 m)  Wt 171 lb (77.565 kg)  BMI 30.30 kg/m2  SpO2 97% VS noted,  Constitutional: Pt appears in no significant distress HENT: Head: NCAT.  Right Ear: External ear normal.  Left Ear: External ear normal.  Eyes: . Pupils are equal, round, and reactive to light. Conjunctivae and EOM are normal Neck: Normal range of motion. Neck supple.  Cardiovascular: Normal rate and regular rhythm.   Pulmonary/Chest: Effort normal and breath sounds without rales or  wheezing.  Abd:  Soft, NT, ND, + BS Spine notender, no lumbar paravertebral tender Neurological: Pt is alert. Not confused , motor grossly intact Skin: Skin is warm. No rash, no LE edema Psychiatric: Pt behavior is normal. No agitation. not depressed affect, mild nervous     Assessment & Plan:

## 2014-11-11 NOTE — Progress Notes (Signed)
Pre visit review using our clinic review tool, if applicable. No additional management support is needed unless otherwise documented below in the visit note. 

## 2014-11-11 NOTE — Patient Instructions (Addendum)
Please continue all other medications as before, and refills have been done if requested.  Please have the pharmacy call with any other refills you may need.  Please continue your efforts at being more active, low cholesterol diet, and weight control..  Please keep your appointments with your specialists as you may have planned  Please return in 6 months, or sooner if needed, with Lab testing done 3-5 days before  

## 2014-11-12 ENCOUNTER — Telehealth: Payer: Self-pay | Admitting: Internal Medicine

## 2014-11-12 DIAGNOSIS — F341 Dysthymic disorder: Secondary | ICD-10-CM

## 2014-11-12 MED ORDER — BUPROPION HCL ER (SR) 100 MG PO TB12: 100.0000 mg | ORAL_TABLET | Freq: Two times a day (BID) | ORAL | Status: DC

## 2014-11-12 NOTE — Telephone Encounter (Signed)
Patient requesting refill for buPROPion [facility] SR) 100 MG 12 hr tablet [id number] .  Pharmacy is [facility] on [address]

## 2014-11-13 ENCOUNTER — Telehealth: Payer: Self-pay | Admitting: Internal Medicine

## 2014-11-13 NOTE — Telephone Encounter (Signed)
Patient need her prescription filled buPROPion (WELLBUTRIN SR) 100 MG 12 hr tablet [id number] .

## 2014-11-13 NOTE — Telephone Encounter (Signed)
Done - see encounter 11/12/2014

## 2014-11-13 NOTE — Assessment & Plan Note (Signed)
stable overall by history and exam, recent data reviewed with pt, and pt to continue medical treatment as before,  to f/u any worsening symptoms or concerns BP Readings from Last 3 Encounters:  11/11/14 124/72  10/07/14 142/82  06/03/14 130/78

## 2014-11-13 NOTE — Assessment & Plan Note (Signed)
C/w msk strain, for tylenol prn, exam benign,  to f/u any worsening symptoms or concerns

## 2014-11-13 NOTE — Assessment & Plan Note (Signed)
Improved, cont same tx,  to f/u any worsening symptoms or concerns, declines counseling

## 2015-02-08 ENCOUNTER — Emergency Department (HOSPITAL_COMMUNITY): Payer: Commercial Managed Care - HMO

## 2015-02-08 ENCOUNTER — Encounter (HOSPITAL_COMMUNITY): Payer: Self-pay | Admitting: *Deleted

## 2015-02-08 ENCOUNTER — Emergency Department (HOSPITAL_COMMUNITY)
Admission: EM | Admit: 2015-02-08 | Discharge: 2015-02-08 | Disposition: A | Discharge status: Home / Self Care | Payer: Commercial Managed Care - HMO | Attending: Emergency Medicine | Admitting: Emergency Medicine

## 2015-02-08 DIAGNOSIS — Y9289 Other specified places as the place of occurrence of the external cause: Secondary | ICD-10-CM | POA: Insufficient documentation

## 2015-02-08 DIAGNOSIS — Y9389 Activity, other specified: Secondary | ICD-10-CM | POA: Diagnosis not present

## 2015-02-08 DIAGNOSIS — Z87891 Personal history of nicotine dependence: Secondary | ICD-10-CM | POA: Insufficient documentation

## 2015-02-08 DIAGNOSIS — Y998 Other external cause status: Secondary | ICD-10-CM | POA: Diagnosis not present

## 2015-02-08 DIAGNOSIS — S6991XA Unspecified injury of right wrist, hand and finger(s), initial encounter: Secondary | ICD-10-CM

## 2015-02-08 DIAGNOSIS — Z79899 Other long term (current) drug therapy: Secondary | ICD-10-CM | POA: Insufficient documentation

## 2015-02-08 DIAGNOSIS — Z7982 Long term (current) use of aspirin: Secondary | ICD-10-CM | POA: Diagnosis not present

## 2015-02-08 DIAGNOSIS — W07XXXA Fall from chair, initial encounter: Secondary | ICD-10-CM | POA: Insufficient documentation

## 2015-02-08 DIAGNOSIS — J449 Chronic obstructive pulmonary disease, unspecified: Secondary | ICD-10-CM | POA: Diagnosis not present

## 2015-02-08 DIAGNOSIS — E785 Hyperlipidemia, unspecified: Secondary | ICD-10-CM | POA: Diagnosis not present

## 2015-02-08 DIAGNOSIS — M7989 Other specified soft tissue disorders: Secondary | ICD-10-CM | POA: Diagnosis not present

## 2015-02-08 DIAGNOSIS — I10 Essential (primary) hypertension: Secondary | ICD-10-CM | POA: Insufficient documentation

## 2015-02-08 MED ORDER — HYDROCODONE-ACETAMINOPHEN 5-325 MG PO TABS: 1.0000 | ORAL_TABLET | Freq: Four times a day (QID) | ORAL | Status: DC | PRN

## 2015-02-08 NOTE — ED Notes (Addendum)
Pt was sitting in a chair, was trying to reach for phone,  Pt fell backwards onto right hand, back hit the wall. Right hand swollen across knuckles, pain 6/10. Unable to move 3rd, 4th, 5th digits. Radial pulse strong. Reports some back and tailbone soreness, but right hand is complaint. Pain 6/10. Pt is right handed

## 2015-02-08 NOTE — Discharge Instructions (Signed)
Hand Contusion °A hand contusion is a deep bruise on your hand area. Contusions are the result of an injury that caused bleeding under the skin. The contusion may turn blue, purple, or yellow. Minor injuries will give you a painless contusion, but more severe contusions may stay painful and swollen for a few weeks. °CAUSES  °A contusion is usually caused by a blow, trauma, or direct force to an area of the body. °SYMPTOMS  °· Swelling and redness of the injured area. °· Discoloration of the injured area. °· Tenderness and soreness of the injured area. °· Pain. °DIAGNOSIS  °The diagnosis can be made by taking a history and performing a physical exam. An X-ray, CT scan, or MRI may be needed to determine if there were any associated injuries, such as broken bones (fractures). °TREATMENT  °Often, the best treatment for a hand contusion is resting, elevating, icing, and applying cold compresses to the injured area. Over-the-counter medicines may also be recommended for pain control. °HOME CARE INSTRUCTIONS  °· Put ice on the injured area. °¨ Put ice in a plastic bag. °¨ Place a towel between your skin and the bag. °¨ Leave the ice on for 15-20 minutes, 03-04 times a day. °· Only take over-the-counter or prescription medicines as directed by your caregiver. Your caregiver may recommend avoiding anti-inflammatory medicines (aspirin, ibuprofen, and naproxen) for 48 hours because these medicines may increase bruising. °· If told, use an elastic wrap as directed. This can help reduce swelling. You may remove the wrap for sleeping, showering, and bathing. If your fingers become numb, cold, or blue, take the wrap off and reapply it more loosely. °· Elevate your hand with pillows to reduce swelling. °· Avoid overusing your hand if it is painful. °SEEK IMMEDIATE MEDICAL CARE IF:  °· You have increased redness, swelling, or pain in your hand. °· Your swelling or pain is not relieved with medicines. °· You have loss of feeling in  your hand or are unable to move your fingers. °· Your hand turns cold or blue. °· You have pain when you move your fingers. °· Your hand becomes warm to the touch. °· Your contusion does not improve in 2 days. °MAKE SURE YOU:  °· Understand these instructions. °· Will watch your condition. °· Will get help right away if you are not doing well or get worse. °Document Released: 10/15/2001 Document Revised: 01/18/2012 Document Reviewed: 10/17/2011 °[employer]® Patient Information ©2015 [employer]. This information is not intended to replace advice given to you by your health care provider. Make sure you discuss any questions you have with your health care provider. ° °

## 2015-02-08 NOTE — ED Provider Notes (Signed)
CSN: [id number]     Arrival date & time 02/08/15  1320 History  By signing my name below, I, Starleen Arms, attest that this documentation has been prepared under the direction and in the presence of Margarita Mail, PA-C. Electronically Signed: Starleen Arms ED Scribe. 02/08/2015. 3:13 PM.    Chief Complaint  Patient presents with  . Hand Injury   The history is provided by the patient. No language interpreter was used.   HPI Comments: [name] is a 69 y.o. female who presents to the Emergency Department complaining of constant, gradually worsening, 7-8/10 pain described as soreness in all fingers of the right hand.  The patient reports she fell backwards out of a chair, catching herself on an outstretched hand, and hyperextending her fingers.    Past Medical History  Diagnosis Date  . Hypertension   . COPD (chronic obstructive pulmonary disease) ([place]) 05/23/2012  . Hyperlipidemia 05/23/2012    T. Chol 234, LDL 130    Past Surgical History  Procedure Laterality Date  . Lung biopsy      left side, not cancerous  . Cervical spine surgery    . Vaginal hysterectomy     Family History  Problem Relation Age of Onset  . Throat cancer Father   . Hypertension Sister   . Colon cancer Neg Hx   . Stomach cancer Neg Hx    Social History  Substance Use Topics  . Smoking status: Former Research scientist (life sciences)  . Smokeless tobacco: Never Used  . Alcohol Use: Yes     Comment: occasional   OB History    No data available     Review of Systems  Constitutional: Negative for fever.  Musculoskeletal: Positive for arthralgias.      Allergies  Ace inhibitors; Other; Pneumococcal vaccines; and Tetanus toxoid  Home Medications   Prior to Admission medications   Medication Sig Start Date End Date Taking? Authorizing Provider  amLODipine (NORVASC) 5 MG tablet take 1 tablet by mouth once daily 10/22/14  Yes Biagio Borg, MD  aspirin EC 81 MG tablet Take 81 mg by mouth daily.   Yes Historical  Provider, MD  atorvastatin (LIPITOR) 10 MG tablet take 1 tablet by mouth once daily 08/04/14  Yes Biagio Borg, MD  buPROPion [facility] SR) 100 MG 12 hr tablet Take 1 tablet (100 mg total) by mouth 2 (two) times daily. 11/12/14  Yes Biagio Borg, MD  pantoprazole (PROTONIX) 40 MG tablet Take 1 tablet (40 mg total) by mouth daily. 02/25/14  Yes Biagio Borg, MD  venlafaxine XR (EFFEXOR-XR) 75 MG 24 hr capsule Take 1 capsule by mouth daily. 01/19/15  Yes Historical Provider, MD  gabapentin (NEURONTIN) 100 MG capsule Take 1 capsule (100 mg total) by mouth 3 (three) times daily. Patient not taking: Reported on 02/08/2015 02/25/14   Biagio Borg, MD  HYDROcodone-acetaminophen [facility]) 5-325 MG tablet Take 1 tablet by mouth every 6 (six) hours as needed. 02/08/15   Kamee Bobst, PA-C   BP 137/74 mmHg  Pulse 71  Temp(Src) 98.9 F (37.2 C) (Oral)  Resp 16  SpO2 100% Physical Exam  Constitutional: She is oriented to person, place, and time. She appears well-developed and well-nourished. No distress.  HENT:  Head: Normocephalic and atraumatic.  Eyes: Conjunctivae and EOM are normal.  Neck: Neck supple. No tracheal deviation present.  Cardiovascular: Normal rate.   Pulmonary/Chest: Effort normal. No respiratory distress.  Musculoskeletal: Normal range of motion.  Neurological: She is alert  and oriented to person, place, and time.  Skin: Skin is warm and dry.  Psychiatric: She has a normal mood and affect. Her behavior is normal.  Nursing note and vitals reviewed.   ED Course  Procedures (including critical care time)  DIAGNOSTIC STUDIES: Oxygen Saturation is 100% on RA, normal by my interpretation.    COORDINATION OF CARE:  3:13 PM Discussed treatment plan with patient at bedside.  Patient acknowledges and agrees with plan.     Labs Review Labs Reviewed - No data to display  Imaging Review Dg Hand Complete Right  02/08/2015   CLINICAL DATA:  Fall with swelling in the right hand.   EXAM: RIGHT HAND - COMPLETE 3+ VIEW  COMPARISON:  None.  FINDINGS: No fracture, dislocation or suspicious focal osseous lesion in the right hand. Mild osteoarthritis in the interphalangeal joint of the right thumb and at the second and third metacarpophalangeal joints. There is a linear 2 mm radiopaque foreign body in the volar right thumb soft tissues at the level of the distal phalanx.  IMPRESSION: 1. No fracture or dislocation in the right hand. 2. Linear 2 mm radiopaque foreign body in the volar right thumb soft tissues of the level of the distal phalanx. 3. 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   Electronically Signed   By: Ilona Sorrel M.D.   On: 02/08/2015 14:46   I have personally reviewed and evaluated these images and lab results as part of my medical decision-making.   EKG Interpretation None      MDM   Final diagnoses:  Hand injury, right, initial encounter    Patient X-Ray negative for obvious fracture or dislocation. Pain managed in ED. Pt advised to follow up with orthopedics if symptoms persist for possibility of missed fracture diagnosis.Conservative therapy recommended and discussed. Patient will be dc home & is agreeable with above plan.   I personally performed the services described in this documentation, which was scribed in my presence. The recorded information has been reviewed and is accurate.      Margarita Mail, PA-C 02/08/15 [place], PA-C 02/08/15 1546  Virgel Manifold, MD 02/11/15 1425

## 2015-02-24 ENCOUNTER — Ambulatory Visit (INDEPENDENT_AMBULATORY_CARE_PROVIDER_SITE_OTHER): Payer: Commercial Managed Care - HMO | Admitting: Internal Medicine

## 2015-02-24 ENCOUNTER — Encounter: Payer: Self-pay | Admitting: Internal Medicine

## 2015-02-24 VITALS — BP 120/82 | HR 73 | Temp 99.0°F | Wt 177.0 lb

## 2015-02-24 DIAGNOSIS — R509 Fever, unspecified: Secondary | ICD-10-CM | POA: Diagnosis not present

## 2015-02-24 DIAGNOSIS — I10 Essential (primary) hypertension: Secondary | ICD-10-CM | POA: Diagnosis not present

## 2015-02-24 DIAGNOSIS — M1250 Traumatic arthropathy, unspecified site: Secondary | ICD-10-CM | POA: Diagnosis not present

## 2015-02-24 DIAGNOSIS — R1310 Dysphagia, unspecified: Secondary | ICD-10-CM

## 2015-02-24 DIAGNOSIS — Z0189 Encounter for other specified special examinations: Secondary | ICD-10-CM

## 2015-02-24 DIAGNOSIS — Z0000 Encounter for general adult medical examination without abnormal findings: Secondary | ICD-10-CM

## 2015-02-24 NOTE — Assessment & Plan Note (Signed)
Exam benign,  to f/u any worsening symptoms or concerns

## 2015-02-24 NOTE — Progress Notes (Signed)
Pre visit review using our clinic review tool, if applicable. No additional management support is needed unless otherwise documented below in the visit note. 

## 2015-02-24 NOTE — Patient Instructions (Addendum)
You will be contacted regarding the referral for: Gastroenterology  Please continue all other medications as before, and refills have been done if requested.  Please have the pharmacy call with any other refills you may need.  Please keep your appointments with your specialists as you may have planned  Please return in 3 months, or sooner if needed

## 2015-02-24 NOTE — Addendum Note (Signed)
Addended by: Biagio Borg on: 02/24/2015 06:17 PM   Modules accepted: Orders

## 2015-02-24 NOTE — Assessment & Plan Note (Signed)
stable overall by history and exam, recent data reviewed with pt, and pt to continue medical treatment as before,  to f/u any worsening symptoms or concerns BP Readings from Last 3 Encounters:  02/24/15 120/82  02/08/15 137/74  11/11/14 124/72

## 2015-02-24 NOTE — Progress Notes (Addendum)
Subjective:    Patient ID: [name], female    DOB: 04-08-1946, 69 y.o.   MRN: [id number]  HPI  Here with 1-2 mo worsening dysphagia to solids, seems worse on the right side of there upper mid chest, gets hung up, has to swallow very hard sometimes.  No vomiting, wt loss, fever , pain and Denies worsening reflux, abd pain, dysphagia, n/v, bowel change or blood.  Good compliacne with PPI.  Pt denies chest pain, increased sob or doe, wheezing, orthopnea, PND, increased LE swelling, palpitations, dizziness or syncope.  Pt denies new neurological symptoms such as new headache, or facial or extremity weakness or numbness .Marland KitchenHas low grade temp today, No ST, cough, or dysuria. Did fall out of a chair accidentally with hitting right finger, still hurts after 3 days Past Medical History  Diagnosis Date  . Hypertension   . COPD (chronic obstructive pulmonary disease) ([place]) 05/23/2012  . Hyperlipidemia 05/23/2012    T. Chol 234, LDL 130    Past Surgical History  Procedure Laterality Date  . Lung biopsy      left side, not cancerous  . Cervical spine surgery    . Vaginal hysterectomy      reports that she has quit smoking. She has never used smokeless tobacco. She reports that she drinks alcohol. She reports that she does not use illicit drugs. family history includes Hypertension in her sister; Throat cancer in her father. There is no history of Colon cancer or Stomach cancer. Allergies  Allergen Reactions  . Ace Inhibitors Anaphylaxis    REACTION: angioedema  . Other Anaphylaxis    Calcium Channel Blocking Agent Diltiazem Analogues  . Pneumococcal Vaccines     Red rash  Knot (sore and itches) A fever for a couple days (per pt report)  . Tetanus Toxoid     Cellulitis in arm   Current Outpatient Prescriptions on File Prior to Visit  Medication Sig Dispense Refill  . amLODipine (NORVASC) 5 MG tablet take 1 tablet by mouth once daily 90 tablet 1  . aspirin EC 81 MG tablet Take 81 mg  by mouth daily.    [name] atorvastatin (LIPITOR) 10 MG tablet take 1 tablet by mouth once daily 90 tablet 3  . buPROPion (WELLBUTRIN SR) 100 MG 12 hr tablet Take 1 tablet (100 mg total) by mouth 2 (two) times daily. 180 tablet 1  . pantoprazole (PROTONIX) 40 MG tablet Take 1 tablet (40 mg total) by mouth daily. 90 tablet 3  . venlafaxine XR (EFFEXOR-XR) 75 MG 24 hr capsule Take 1 capsule by mouth daily.  0  . gabapentin (NEURONTIN) 100 MG capsule Take 1 capsule (100 mg total) by mouth 3 (three) times daily. (Patient not taking: Reported on 02/08/2015) 90 capsule 3  . HYDROcodone-acetaminophen (NORCO) 5-325 MG tablet Take 1 tablet by mouth every 6 (six) hours as needed. (Patient not taking: Reported on 02/24/2015) 6 tablet 0   No current facility-administered medications on file prior to visit.   Review of Systems  Constitutional: Negative for unusual diaphoresis or night sweats HENT: Negative for ringing in ear or discharge Eyes: Negative for double vision or worsening visual disturbance.  Respiratory: Negative for choking and stridor.   Gastrointestinal: Negative for vomiting or other signifcant bowel change Genitourinary: Negative for hematuria or change in urine volume.  Musculoskeletal: Negative for other MSK pain or swelling Skin: Negative for color change and worsening wound.  Neurological: Negative for tremors and numbness other than noted  Psychiatric/Behavioral: Negative for decreased concentration or agitation other than above       Objective:   Physical Exam BP 120/82 mmHg  Pulse 73  Temp(Src) 99 F (37.2 C)  Wt 177 lb (80.287 kg)  SpO2 96% VS noted,  Constitutional: Pt appears in no significant distress HENT: Head: NCAT.  Right Ear: External ear normal.  Left Ear: External ear normal.  Eyes: . Pupils are equal, round, and reactive to light. Conjunctivae and EOM are normal Neck: Normal range of motion. Neck supple.  Cardiovascular: Normal rate and regular rhythm.     Pulmonary/Chest: Effort normal and breath sounds without rales or wheezing.  Right hand with 2+ DIP tender, swelling, decr ROM Abd:  Soft, NT, ND, + BS Neurological: Pt is alert. Not confused , motor grossly intact Skin: Skin is warm. No rash, no LE edema Psychiatric: Pt behavior is normal. No agitation.      Assessment & Plan:

## 2015-02-24 NOTE — Assessment & Plan Note (Signed)
Right hand 3rd finger PIP - mild to mod, d/w pt, for capsiacin otc prn,  to f/u any worsening symptoms or concerns

## 2015-02-24 NOTE — Assessment & Plan Note (Signed)
etiollogy unclear, cant r/o malignancy, last EGD 2010, for cont'd PPI, also refer GI for probable need for EGD with dilation, r/o malignancy

## 2015-02-26 ENCOUNTER — Encounter: Payer: Self-pay | Admitting: Internal Medicine

## 2015-03-08 ENCOUNTER — Other Ambulatory Visit: Payer: Self-pay | Admitting: Internal Medicine

## 2015-03-11 ENCOUNTER — Other Ambulatory Visit: Payer: Self-pay | Admitting: Internal Medicine

## 2015-03-25 DIAGNOSIS — S63619A Unspecified sprain of unspecified finger, initial encounter: Secondary | ICD-10-CM | POA: Diagnosis not present

## 2015-03-27 DIAGNOSIS — S63634D Sprain of interphalangeal joint of right ring finger, subsequent encounter: Secondary | ICD-10-CM | POA: Diagnosis not present

## 2015-03-27 DIAGNOSIS — S63632D Sprain of interphalangeal joint of right middle finger, subsequent encounter: Secondary | ICD-10-CM | POA: Diagnosis not present

## 2015-03-27 DIAGNOSIS — S63636D Sprain of interphalangeal joint of right little finger, subsequent encounter: Secondary | ICD-10-CM | POA: Diagnosis not present

## 2015-03-30 ENCOUNTER — Ambulatory Visit (INDEPENDENT_AMBULATORY_CARE_PROVIDER_SITE_OTHER): Payer: Commercial Managed Care - HMO

## 2015-03-30 VITALS — BP 138/80 | Ht 63.0 in | Wt 168.0 lb

## 2015-03-30 DIAGNOSIS — Z0000 Encounter for general adult medical examination without abnormal findings: Secondary | ICD-10-CM

## 2015-03-30 NOTE — Patient Instructions (Addendum)
[name] , Thank you for taking time to come for your Medicare Wellness Visit. I appreciate your ongoing commitment to your health goals. Please review the following plan we discussed and let me know if I can assist you in the future.   Will exericise x 3 days a wees x 30 min  Will have consider eye exam over the next few months  [email or web address] for community resources;   Will share Advanced Directive form  Will consider a dexa scan over the few months;  Educated to check with insurance regarding coverage of Shingles vaccination on Part D or Part B and may have lower co-pay if provided on the Part D side      These are the goals we discussed: Goals    . Exercise 150 minutes per week (moderate activity)     Start walking 3 days a week;         This is a list of the screening recommended for you and due dates:  Health Maintenance  Topic Date Due  . Tetanus Vaccine  09/06/1962  . Shingles Vaccine  09/06/2003  . DEXA scan (bone density measurement)  09/05/2008  . Flu Shot  12/08/2014  . Mammogram  02/25/2016  . Colon Cancer Screening  07/10/2022  .  Hepatitis C: One time screening is recommended by Center for Disease Control  (CDC) for  adults born from [birth year] through 1967.   Completed  . Pneumonia vaccines  Completed     Bone Densitometry Bone densitometry is an imaging test that uses a special X-ray to measure the amount of calcium and other minerals in your bones (bone density). This test is also known as a bone mineral density test or dual-energy X-ray absorptiometry (DXA). The test can measure bone density at your hip and your spine. It is similar to having a regular X-ray. You may have this test to:  Diagnose a condition that causes weak or thin bones (osteoporosis).  Predict your risk of a broken bone (fracture).  Determine how well osteoporosis treatment is working. LET [facility] CARE PROVIDER KNOW ABOUT:  Any allergies you have.  All medicines you are  taking, including vitamins, herbs, eye drops, creams, and over-the-counter medicines.  Previous problems you or members of your family have had with the use of anesthetics.  Any blood disorders you have.  Previous surgeries you have had.  Medical conditions you have.  Possibility of pregnancy.  Any other medical test you had within the previous 14 days that used contrast material. RISKS AND COMPLICATIONS Generally, this is a safe procedure. However, problems can occur and may include the following:  This test exposes you to a very small amount of radiation.  The risks of radiation exposure may be greater to unborn children. BEFORE THE PROCEDURE  Do not take any calcium supplements for 24 hours before having the test. You can otherwise eat and drink what you usually do.  Take off all metal jewelry, eyeglasses, dental appliances, and any other metal objects. PROCEDURE  You may lie on an exam table. There will be an X-ray generator below you and an imaging device above you.  Other devices, such as boxes or braces, may be used to position your body properly for the scan.  You will need to lie still while the machine slowly scans your body.  The images will show up on a computer monitor. AFTER THE PROCEDURE You may need more testing at a later time.   This information is not  intended to replace advice given to you by your health care provider. Make sure you discuss any questions you have with your health care provider.   Document Released: 05/17/2004 Document Revised: 05/16/2014 Document Reviewed: 10/03/2013 Elsevier Interactive Patient Education 2016 [employer]  Fat and Cholesterol Restricted Diet Getting too much fat and cholesterol in your diet may cause health problems. Following this diet helps keep your fat and cholesterol at normal levels. This can keep you from getting sick. WHAT TYPES OF FAT SHOULD I CHOOSE?  Choose monosaturated and polyunsaturated fats. These  are found in foods such as olive oil, canola oil, flaxseeds, walnuts, almonds, and seeds.  Eat more omega-3 fats. Good choices include salmon, mackerel, sardines, tuna, flaxseed oil, and ground flaxseeds.  Limit saturated fats. These are in animal products such as meats, butter, and cream. They can also be in plant products such as palm oil, palm kernel oil, and coconut oil.   Avoid foods with partially hydrogenated oils in them. These contain trans fats. Examples of foods that have trans fats are stick margarine, some tub margarines, cookies, crackers, and other baked goods. WHAT GENERAL GUIDELINES DO I NEED TO FOLLOW?   Check food labels. Look for the words "trans fat" and "saturated fat."  When preparing a meal:  Fill half of your plate with vegetables and green salads.  Fill one fourth of your plate with whole grains. Look for the word "whole" as the first word in the ingredient list.  Fill one fourth of your plate with lean protein foods.  Limit fruit to two servings a day. Choose fruit instead of juice.  Eat more foods with soluble fiber. Examples of foods with this type of fiber are apples, broccoli, carrots, beans, peas, and barley. Try to get 20-30 g (grams) of fiber per day.  Eat more home-cooked foods. Eat less at restaurants and buffets.  Limit or avoid alcohol.  Limit foods high in starch and sugar.  Limit fried foods.  Cook foods without frying them. Baking, boiling, grilling, and broiling are all great options.  Lose weight if you are overweight. Losing even a small amount of weight can help your overall health. It can also help prevent diseases such as diabetes and heart disease. WHAT FOODS CAN I EAT? Grains Whole grains, such as whole wheat or whole grain breads, crackers, cereals, and pasta. Unsweetened oatmeal, bulgur, barley, quinoa, or brown rice. Corn or whole wheat flour tortillas. Vegetables Fresh or frozen vegetables (raw, steamed, roasted, or grilled).  Green salads. Fruits All fresh, canned (in natural juice), or frozen fruits. Meat and Other Protein Products Ground beef (85% or leaner), grass-fed beef, or beef trimmed of fat. Skinless chicken or [place]. Ground chicken or [place]. Pork trimmed of fat. All fish and seafood. Eggs. Dried beans, peas, or lentils. Unsalted nuts or seeds. Unsalted canned or dry beans. Dairy Low-fat dairy products, such as skim or 1% milk, 2% or reduced-fat cheeses, low-fat ricotta or cottage cheese, or plain low-fat yogurt. Fats and Oils Tub margarines without trans fats. Light or reduced-fat mayonnaise and salad dressings. Avocado. Olive, canola, sesame, or safflower oils. Natural peanut or almond butter (choose ones without added sugar and oil). The items listed above may not be a complete list of recommended foods or beverages. Contact your dietitian for more options. WHAT FOODS ARE NOT RECOMMENDED? Grains White bread. White pasta. White rice. Cornbread. Bagels, pastries, and croissants. Crackers that contain trans fat. Vegetables White potatoes. Corn. Creamed or fried vegetables. Vegetables in  a cheese sauce. Fruits Dried fruits. Canned fruit in light or heavy syrup. Fruit juice. Meat and Other Protein Products Fatty cuts of meat. Ribs, chicken wings, bacon, sausage, bologna, salami, chitterlings, fatback, hot dogs, bratwurst, and packaged luncheon meats. Liver and organ meats. Dairy Whole or 2% milk, cream, half-and-half, and cream cheese. Whole milk cheeses. Whole-fat or sweetened yogurt. Full-fat cheeses. Nondairy creamers and whipped toppings. Processed cheese, cheese spreads, or cheese curds. Sweets and Desserts Corn syrup, sugars, honey, and molasses. Candy. Jam and jelly. Syrup. Sweetened cereals. Cookies, pies, cakes, donuts, muffins, and ice cream. Fats and Oils Butter, stick margarine, lard, shortening, ghee, or bacon fat. Coconut, palm kernel, or palm oils. Beverages Alcohol. Sweetened drinks  (such as sodas, lemonade, and fruit drinks or punches). The items listed above may not be a complete list of foods and beverages to avoid. Contact your dietitian for more information.   This information is not intended to replace advice given to you by your health care provider. Make sure you discuss any questions you have with your health care provider.   Document Released: 10/25/2011 Document Revised: 05/16/2014 Document Reviewed: 07/25/2013 Elsevier Interactive Patient Education 2016 [place] in the Home  Falls can cause injuries. They can happen to people of all ages. There are many things you can do to make your home safe and to help prevent falls.  WHAT CAN I DO ON THE OUTSIDE OF MY HOME?  Regularly fix the edges of walkways and driveways and fix any cracks.  Remove anything that might make you trip as you walk through a door, such as a raised step or threshold.  Trim any bushes or trees on the path to your home.  Use bright outdoor lighting.  Clear any walking paths of anything that might make someone trip, such as rocks or tools.  Regularly check to see if handrails are loose or broken. Make sure that both sides of any steps have handrails.  Any raised decks and porches should have guardrails on the edges.  Have any leaves, snow, or ice cleared regularly.  Use sand or salt on walking paths during winter.  Clean up any spills in your garage right away. This includes oil or grease spills. WHAT CAN I DO IN THE BATHROOM?   Use night lights.  Install grab bars by the toilet and in the tub and shower. Do not use towel bars as grab bars.  Use non-skid mats or decals in the tub or shower.  If you need to sit down in the shower, use a plastic, non-slip stool.  Keep the floor dry. Clean up any water that spills on the floor as soon as it happens.  Remove soap buildup in the tub or shower regularly.  Attach bath mats securely with double-sided non-slip  rug tape.  Do not have throw rugs and other things on the floor that can make you trip. WHAT CAN I DO IN THE BEDROOM?  Use night lights.  Make sure that you have a light by your bed that is easy to reach.  Do not use any sheets or blankets that are too big for your bed. They should not hang down onto the floor.  Have a firm chair that has side arms. You can use this for support while you get dressed.  Do not have throw rugs and other things on the floor that can make you trip. WHAT CAN I DO IN THE KITCHEN?  Clean up any spills right  away.  Avoid walking on wet floors.  Keep items that you use a lot in easy-to-reach places.  If you need to reach something above you, use a strong step stool that has a grab bar.  Keep electrical cords out of the way.  Do not use floor polish or wax that makes floors slippery. If you must use wax, use non-skid floor wax.  Do not have throw rugs and other things on the floor that can make you trip. WHAT CAN I DO WITH MY STAIRS?  Do not leave any items on the stairs.  Make sure that there are handrails on both sides of the stairs and use them. Fix handrails that are broken or loose. Make sure that handrails are as long as the stairways.  Check any carpeting to make sure that it is firmly attached to the stairs. Fix any carpet that is loose or worn.  Avoid having throw rugs at the top or bottom of the stairs. If you do have throw rugs, attach them to the floor with carpet tape.  Make sure that you have a light switch at the top of the stairs and the bottom of the stairs. If you do not have them, ask someone to add them for you. WHAT ELSE CAN I DO TO HELP PREVENT FALLS?  Wear shoes that:  Do not have high heels.  Have rubber bottoms.  Are comfortable and fit you well.  Are closed at the toe. Do not wear sandals.  If you use a stepladder:  Make sure that it is fully opened. Do not climb a closed stepladder.  Make sure that both sides of  the stepladder are locked into place.  Ask someone to hold it for you, if possible.  Clearly mark and make sure that you can see:  Any grab bars or handrails.  First and last steps.  Where the edge of each step is.  Use tools that help you move around (mobility aids) if they are needed. These include:  Canes.  Walkers.  Scooters.  Crutches.  Turn on the lights when you go into a dark area. Replace any light bulbs as soon as they burn out.  Set up your furniture so you have a clear path. Avoid moving your furniture around.  If any of your floors are uneven, fix them.  If there are any pets around you, be aware of where they are.  Review your medicines with your doctor. Some medicines can make you feel dizzy. This can increase your chance of falling. Ask your doctor what other things that you can do to help prevent falls.   This information is not intended to replace advice given to you by your health care provider. Make sure you discuss any questions you have with your health care provider.   Document Released: 02/19/2009 Document Revised: 09/09/2014 Document Reviewed: 05/30/2014 Elsevier Interactive Patient Education 2016 [place] Maintenance, Female Adopting a healthy lifestyle and getting preventive care can go a long way to promote health and wellness. Talk with your health care provider about what schedule of regular examinations is right for you. This is a good chance for you to check in with your provider about disease prevention and staying healthy. In between checkups, there are plenty of things you can do on your own. Experts have done a lot of research about which lifestyle changes and preventive measures are most likely to keep you healthy. Ask your health care provider for more information. WEIGHT  AND DIET  Eat a healthy diet  Be sure to include plenty of vegetables, fruits, low-fat dairy products, and lean protein.  Do not eat a lot of foods  high in solid fats, added sugars, or salt.  Get regular exercise. This is one of the most important things you can do for your health.  Most adults should exercise for at least 150 minutes each week. The exercise should increase your heart rate and make you sweat (moderate-intensity exercise).  Most adults should also do strengthening exercises at least twice a week. This is in addition to the moderate-intensity exercise.  Maintain a healthy weight  Body mass index (BMI) is a measurement that can be used to identify possible weight problems. It estimates body fat based on height and weight. Your health care provider can help determine your BMI and help you achieve or maintain a healthy weight.  For females 23 years of age and older:   A BMI below 18.5 is considered underweight.  A BMI of 18.5 to 24.9 is normal.  A BMI of 25 to 29.9 is considered overweight.  A BMI of 30 and above is considered obese.  Watch levels of cholesterol and blood lipids  You should start having your blood tested for lipids and cholesterol at 71 years of age, then have this test every 5 years.  You may need to have your cholesterol levels checked more often if:  Your lipid or cholesterol levels are high.  You are older than 71 years of age.  You are at high risk for heart disease.  CANCER SCREENING   Lung Cancer  Lung cancer screening is recommended for adults 58-66 years old who are at high risk for lung cancer because of a history of smoking.  A yearly low-dose CT scan of the lungs is recommended for people who:  Currently smoke.  Have quit within the past 15 years.  Have at least a 30-pack-year history of smoking. A pack year is smoking an average of one pack of cigarettes a day for 1 year.  Yearly screening should continue until it has been 15 years since you quit.  Yearly screening should stop if you develop a health problem that would prevent you from having lung cancer treatment.   Breast Cancer  Practice breast self-awareness. This means understanding how your breasts normally appear and feel.  It also means doing regular breast self-exams. Let your health care provider know about any changes, no matter how small.  If you are in your 20s or 30s, you should have a clinical breast exam (CBE) by a health care provider every 1-3 years as part of a regular health exam.  If you are 23 or older, have a CBE every year. Also consider having a breast X-ray (mammogram) every year.  If you have a family history of breast cancer, talk to your health care provider about genetic screening.  If you are at high risk for breast cancer, talk to your health care provider about having an MRI and a mammogram every year.  Breast cancer gene (BRCA) assessment is recommended for women who have family members with BRCA-related cancers. BRCA-related cancers include:  Breast.  Ovarian.  Tubal.  Peritoneal cancers.  Results of the assessment will determine the need for genetic counseling and BRCA1 and BRCA2 testing. Cervical Cancer Your health care provider may recommend that you be screened regularly for cancer of the pelvic organs (ovaries, uterus, and vagina). This screening involves a pelvic examination, including checking  for microscopic changes to the surface of your cervix (Pap test). You may be encouraged to have this screening done every 3 years, beginning at age 47.  For women ages 46-65, health care providers may recommend pelvic exams and Pap testing every 3 years, or they may recommend the Pap and pelvic exam, combined with testing for human papilloma virus (HPV), every 5 years. Some types of HPV increase your risk of cervical cancer. Testing for HPV may also be done on women of any age with unclear Pap test results.  Other health care providers may not recommend any screening for nonpregnant women who are considered low risk for pelvic cancer and who do not have symptoms.  Ask your health care provider if a screening pelvic exam is right for you.  If you have had past treatment for cervical cancer or a condition that could lead to cancer, you need Pap tests and screening for cancer for at least 20 years after your treatment. If Pap tests have been discontinued, your risk factors (such as having a new sexual partner) need to be reassessed to determine if screening should resume. Some women have medical problems that increase the chance of getting cervical cancer. In these cases, your health care provider may recommend more frequent screening and Pap tests. Colorectal Cancer  This type of cancer can be detected and often prevented.  Routine colorectal cancer screening usually begins at 71 years of age and continues through 71 years of age.  Your health care provider may recommend screening at an earlier age if you have risk factors for colon cancer.  Your health care provider may also recommend using home test kits to check for hidden blood in the stool.  A small camera at the end of a tube can be used to examine your colon directly (sigmoidoscopy or colonoscopy). This is done to check for the earliest forms of colorectal cancer.  Routine screening usually begins at age 77.  Direct examination of the colon should be repeated every 5-10 years through 71 years of age. However, you may need to be screened more often if early forms of precancerous polyps or small growths are found. Skin Cancer  Check your skin from head to toe regularly.  Tell your health care provider about any new moles or changes in moles, especially if there is a change in a mole's shape or color.  Also tell your health care provider if you have a mole that is larger than the size of a pencil eraser.  Always use sunscreen. Apply sunscreen liberally and repeatedly throughout the day.  Protect yourself by wearing long sleeves, pants, a wide-brimmed hat, and sunglasses whenever you are  outside. HEART DISEASE, DIABETES, AND HIGH BLOOD PRESSURE   High blood pressure causes heart disease and increases the risk of stroke. High blood pressure is more likely to develop in:  People who have blood pressure in the high end of the normal range (130-139/85-89 mm Hg).  People who are overweight or obese.  People who are African American.  If you are 31-84 years of age, have your blood pressure checked every 3-5 years. If you are 61 years of age or older, have your blood pressure checked every year. You should have your blood pressure measured twice--once when you are at a hospital or clinic, and once when you are not at a hospital or clinic. Record the average of the two measurements. To check your blood pressure when you are not at a hospital or  clinic, you can use:  An automated blood pressure machine at a pharmacy.  A home blood pressure monitor.  If you are between 41 years and 52 years old, ask your health care provider if you should take aspirin to prevent strokes.  Have regular diabetes screenings. This involves taking a blood sample to check your fasting blood sugar level.  If you are at a normal weight and have a low risk for diabetes, have this test once every three years after 71 years of age.  If you are overweight and have a high risk for diabetes, consider being tested at a younger age or more often. PREVENTING INFECTION  Hepatitis B  If you have a higher risk for hepatitis B, you should be screened for this virus. You are considered at high risk for hepatitis B if:  You were born in a country where hepatitis B is common. Ask your health care provider which countries are considered high risk.  Your parents were born in a high-risk country, and you have not been immunized against hepatitis B (hepatitis B vaccine).  You have HIV or AIDS.  You use needles to inject street drugs.  You live with someone who has hepatitis B.  You have had sex with someone who has  hepatitis B.  You get hemodialysis treatment.  You take certain medicines for conditions, including cancer, organ transplantation, and autoimmune conditions. Hepatitis C  Blood testing is recommended for:  Everyone born from [birth year] through 1967.  Anyone with known risk factors for hepatitis C. Sexually transmitted infections (STIs)  You should be screened for sexually transmitted infections (STIs) including gonorrhea and chlamydia if:  You are sexually active and are younger than 71 years of age.  You are older than 71 years of age and your health care provider tells you that you are at risk for this type of infection.  Your sexual activity has changed since you were last screened and you are at an increased risk for chlamydia or gonorrhea. Ask your health care provider if you are at risk.  If you do not have HIV, but are at risk, it may be recommended that you take a prescription medicine daily to prevent HIV infection. This is called pre-exposure prophylaxis (PrEP). You are considered at risk if:  You are sexually active and do not regularly use condoms or know the HIV status of your partner(s).  You take drugs by injection.  You are sexually active with a partner who has HIV. Talk with your health care provider about whether you are at high risk of being infected with HIV. If you choose to begin PrEP, you should first be tested for HIV. You should then be tested every 3 months for as long as you are taking PrEP.  PREGNANCY   If you are premenopausal and you may become pregnant, ask your health care provider about preconception counseling.  If you may become pregnant, take 400 to 800 micrograms (mcg) of folic acid every day.  If you want to prevent pregnancy, talk to your health care provider about birth control (contraception). OSTEOPOROSIS AND MENOPAUSE   Osteoporosis is a disease in which the bones lose minerals and strength with aging. This can result in serious bone  fractures. Your risk for osteoporosis can be identified using a bone density scan.  If you are 49 years of age or older, or if you are at risk for osteoporosis and fractures, ask your health care provider if you should be screened.  Ask your health care provider whether you should take a calcium or vitamin D supplement to lower your risk for osteoporosis.  Menopause may have certain physical symptoms and risks.  Hormone replacement therapy may reduce some of these symptoms and risks. Talk to your health care provider about whether hormone replacement therapy is right for you.  HOME CARE INSTRUCTIONS   Schedule regular health, dental, and eye exams.  Stay current with your immunizations.   Do not use any tobacco products including cigarettes, chewing tobacco, or electronic cigarettes.  If you are pregnant, do not drink alcohol.  If you are breastfeeding, limit how much and how often you drink alcohol.  Limit alcohol intake to no more than 1 drink per day for nonpregnant women. One drink equals 12 ounces of beer, 5 ounces of wine, or 1 ounces of hard liquor.  Do not use street drugs.  Do not share needles.  Ask your health care provider for help if you need support or information about quitting drugs.  Tell your health care provider if you often feel depressed.  Tell your health care provider if you have ever been abused or do not feel safe at home.   This information is not intended to replace advice given to you by your health care provider. Make sure you discuss any questions you have with your health care provider.   Document Released: 11/08/2010 Document Revised: 05/16/2014 Document Reviewed: 03/27/2013 Elsevier Interactive Patient Education [employer].

## 2015-03-30 NOTE — Progress Notes (Addendum)
Subjective:   [name] is a 69 y.o. female who presents for Medicare Annual (Subsequent) preventive examination.  Review of Systems:  HRA assessment completed during visit; Westley Chandler The Patient was informed that this wellness visit is to identify risk and educate on how to reduce risk for increase disease through lifestyle changes.   ROS deferred to CPE exam with physician  Describes health as good;  Swallowing difficulty at present; dysphagia / currently working up at the end of Dec/ Recommended she call to go on a waiting list or first available  Father had throat cancer; Mother; died of old age; 32; aneurysm.    Medical issues HTN; BP running good; 135/70; check every day States she cut her BP med in half due to it being to low Copd; Did test at [facility] but could not find anything;  Breathing good now; was told that fup was no necessary unless she started having issues; Sob; etc PFT was normal at the time and was told to watch; this was 2010 Neutropenia; not aware of this;  Osteopenia - normal per last dexa;  Agrees to repeat but will check with insurance first  Hyperlipidemia/ 06/03/2014; cho 160; trigl 71; HDL 63; LDL 87 (A1c 5.8 2011; glucose <92   BMI: (30) 29.8 Diet; fruit and muffin at breakfast; fried egg; boiled eggs Lunch; sandwich; grilled cheese; brown bread; baked chips Supper eats late; spaghetti; doesn't eat a full meal; vegetables, salad; or chicken Exercise; don't have time. Needs to walk; can do in the am; 30 minutes around neighborhood;  Likes winter and may start walking;  1 -10 /very motivated;   SAFETY; live alone; one level;  Safety reviewed for the home;  clear paths through the home, eliminating clutter, railing as needed; bathroom safety; has to get in tub to take shower; has a rail ; community safety yes; smoke detectors ( yes) and firearms safety / reviewed  Driving accidents; does not drive; discussed Adult driving schools; Did not  have a good instruction when trying in the past. Sun protection; doesn't get in the sun  Stressors; 1-5; financial stress; given information on resources;   Medication review/ tolerating well; feels depression has lifted from medication   Fall assessment fell Oct 1st; slipped out of chair reaching backwards;  Sprain hand  Gait assessment; no issues  Mobilization and Functional losses in the last year/ Climbing stairs; no sob Sleep patterns; sleeps well    Urinary or fecal incontinence reviewed/ no  Some constipation   Advanced Directive; No advanced directive/.  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 Reviewed HCOPA; revoking the POA; reviewed LW including tube feeds, ventilation and any other direction;  Educated regarding the need to sign with 2 witnesses and notary. Will bring back to the office to copy and will discuss any further questions with doctor at her next apt.    Counseling: Colonoscopy; 07/09/2012 EKG: 06/03/2014 Hearing: 4000 left ear and 2000 right ear, but feels she has some sinus issues on the left Dexa; DUE (04/30/2012 per [facility]) will consider repeat dexa; to call insurer for coverage; felt her last dexa was normal but will plan to have one over the next few months.  Mammagram 02/21/2014 no malignancy/ pending this year  Ophthalmology exam; cataracts x 2 to 69 yo; recommend  q 2 years;   Immunizations Due  Tetanus (ALLERGIC as well as Pneumonia vaccines)  had a long time ago; but severe reaction; / does not want another tetanus Zostavax/ educated regarding coverage and part d Flu / coming back in January; because to much going on now and will see dr. Jenny Reichmann. Declined to take it today   Current Care Team reviewed and updated Dr. Raina Mina is GYN Dr. [name]  Dr. Onnie Graham  Cardiac  Risk Factors include: advanced age (>9men, >66 women);dyslipidemia (walking will help )     Objective:     Vitals: BP 138/80 mmHg  Ht 5\' 3"  (1.6 m)  Wt 168 lb 4 oz (76.318 kg)  BMI 29.81 kg/m2  Tobacco History  Smoking status  . Former Smoker  Smokeless tobacco  . Never Used     Counseling given: Yes   Past Medical History  Diagnosis Date  . Hypertension   . COPD (chronic obstructive pulmonary disease) ([place]) 05/23/2012  . Hyperlipidemia 05/23/2012    T. Chol 234, LDL 130    Past Surgical History  Procedure Laterality Date  . Lung biopsy      left side, not cancerous  . Cervical spine surgery    . Vaginal hysterectomy     Family History  Problem Relation Age of Onset  . Throat cancer Father   . Hypertension Sister   . Colon cancer Neg Hx   . Stomach cancer Neg Hx    History  Sexual Activity  . Sexual Activity: Not on file    Outpatient Encounter Prescriptions as of 03/30/2015  Medication Sig  . amLODipine (NORVASC) 5 MG tablet take 1 tablet by mouth once daily  . aspirin EC 81 MG tablet Take 81 mg by mouth daily.  [name] atorvastatin (LIPITOR) 10 MG tablet take 1 tablet by mouth once daily  . buPROPion (WELLBUTRIN SR) 100 MG 12 hr tablet Take 1 tablet (100 mg total) by mouth 2 (two) times daily.  . pantoprazole (PROTONIX) 40 MG tablet take 1 tablet by mouth once daily  . venlafaxine XR (EFFEXOR-XR) 75 MG 24 hr capsule Take 1 capsule by mouth daily.  [name] gabapentin (NEURONTIN) 100 MG capsule Take 1 capsule (100 mg total) by mouth 3 (three) times daily. (Patient not taking: Reported on 03/30/2015)  . [DISCONTINUED] HYDROcodone-acetaminophen (NORCO) 5-325 MG tablet Take 1 tablet by mouth every 6 (six) hours as needed. (Patient not taking: Reported on 03/30/2015)   No facility-administered encounter medications on file as of 03/30/2015.    Activities of Daily Living In your present state of health, do you have any difficulty performing the following activities:  03/30/2015  Hearing? N  Vision? N  Difficulty concentrating or making decisions? N  Walking or climbing stairs? N  Dressing or bathing? N  Doing errands, shopping? N  Preparing Food and eating ? N  Using the Toilet? N  In the past six months, have you accidently leaked urine? N  Do you have problems with loss of bowel control? N  Managing your Medications? N  Managing your Finances? N  Housekeeping or managing your Housekeeping? N    Patient Care Team: Biagio Borg, MD as PCP - General    Assessment:    Assessment   Patient presents for yearly preventative medicine examination. Medicare questionnaire screening were completed, i.e. Functional; fall risk; depression, memory loss and hearing and was unremarkable   All immunizations and health maintenance protocols were reviewed with the patient and needed orders were placed.  Education provided for laboratory screens;    Medication reconciliation, past medical history, social history, problem list and allergies were reviewed in detail with the patient  Goals were established with regard to weight loss, exercise, and diet in compliance with medications based on the patient individualized risk;   End of life planning was discussed.   Exercise Activities and Dietary recommendations Current Exercise Habits:: Home exercise routine, Type of exercise: walking, Time (Minutes): 30, Frequency (Times/Week): 3, Weekly Exercise (Minutes/Week): 90, Intensity: Moderate  Goals    . Exercise 150 minutes per week (moderate activity)     Start walking 3 days a week;        Fall Risk Fall Risk  03/30/2015 06/03/2014 05/28/2013  Falls in the past year? Yes No Yes  Number falls in past yr: 1 - 1  Injury with Fall? Yes - No  Follow up Education provided - -   Depression Screen PHQ 2/9 Scores 03/30/2015 06/03/2014 05/28/2013  PHQ - 2 Score 0 0 0     Cognitive Testing No flowsheet data found. Ad8 Score 0; no issues identified    Immunization History  Administered Date(s) Administered  . Influenza Split 06/19/2012  . Influenza Whole 03/09/2009  . Influenza,inj,Quad PF,36+ Mos 05/28/2013, 06/03/2014  . Pneumococcal Conjugate-13 06/19/2014  . Pneumococcal Polysaccharide-23 05/23/2012   Screening Tests Health Maintenance  Topic Date Due  . TETANUS/TDAP  09/06/1962  . ZOSTAVAX  09/06/2003  . DEXA SCAN  09/05/2008  . INFLUENZA VACCINE  12/08/2014  . MAMMOGRAM  02/25/2016  . COLONOSCOPY  07/10/2022  . Hepatitis C Screening  Completed  . 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;   Will consider a dexa scan over the few months;  Educated to check with insurance regarding coverage of Shingles vaccination on Part D or Part B and may have lower co-pay if provided on the Part D side   During the course of the visit the patient was educated and counseled about the following appropriate screening and preventive services:   Vaccines to include Pneumoccal, Influenza, Hepatitis B, Td, Zostavax, HCV/ educated on Zoster; Allergic to tetanus; declines flu; stated she may take in January;   Electrocardiogram /06/03/2014  Cardiovascular Disease; BP managed medically; hyperlipidemia managed medically   Colorectal cancer screening 07/09/2012  Bone density screening ; done in 2013; will call insurer to discern oop   Diabetes screening/ A1c 5.8/ Fast glucose < 100   Glaucoma screening/ recommend discussed to have eyes checked q 2 years and annually if feasible  Mammography/PAP/ pending this year  Nutrition counseling / adequate; May start walking in am which may help  with calories and weight gain.  Patient Instructions (the written plan) was given to the patient.   Wynetta Fines, RN  03/30/2015  Medical screening examination/treatment/procedure(s) were performed by non-physician practitioner and as supervising physician I was immediately available for consultation/collaboration. I agree with above. [name], MD

## 2015-04-14 ENCOUNTER — Other Ambulatory Visit: Payer: Self-pay | Admitting: Internal Medicine

## 2015-04-27 DIAGNOSIS — S63619D Unspecified sprain of unspecified finger, subsequent encounter: Secondary | ICD-10-CM | POA: Diagnosis not present

## 2015-05-05 ENCOUNTER — Ambulatory Visit (INDEPENDENT_AMBULATORY_CARE_PROVIDER_SITE_OTHER): Payer: Commercial Managed Care - HMO | Admitting: Internal Medicine

## 2015-05-05 ENCOUNTER — Encounter: Payer: Self-pay | Admitting: Internal Medicine

## 2015-05-05 VITALS — BP 116/70 | HR 76 | Ht 62.0 in | Wt 172.1 lb

## 2015-05-05 DIAGNOSIS — Q396 Congenital diverticulum of esophagus: Secondary | ICD-10-CM | POA: Diagnosis not present

## 2015-05-05 DIAGNOSIS — K222 Esophageal obstruction: Secondary | ICD-10-CM

## 2015-05-05 DIAGNOSIS — R1310 Dysphagia, unspecified: Secondary | ICD-10-CM | POA: Diagnosis not present

## 2015-05-05 DIAGNOSIS — K219 Gastro-esophageal reflux disease without esophagitis: Secondary | ICD-10-CM

## 2015-05-05 NOTE — Progress Notes (Signed)
HISTORY OF PRESENT ILLNESS:  [name] is a 69 y.o. female with a history of GERD, get a peptic stricture, esophageal diverticulum, hiatal hernia, constipation predominant irritable bowel syndrome. I last saw the patient 07/09/2012 when she underwent routine screening colonoscopy. The examination was normal except for mild sigmoid diverticulosis. Routine follow-up in 10 years recommended. The patient is sent today by her primary care provider, Dr. Jenny Reichmann, with chief complaint of dysphagia. The patient reports problems with progressive intermittent solid food dysphagia over the past 6 months. She points to the right side of her neck and states that it is difficult for her to force food down from the posterior pharynx. She does not regurgitate. She has had cervical neck surgery around 2005. She's had some weight gain. She does take pantoprazole 40 mg daily for GERD. No other complaints.  REVIEW OF SYSTEMS:  All non-GI ROS negative except for headaches, sore throat, sinus trouble, epistaxis  Past Medical History  Diagnosis Date  . Hypertension   . COPD (chronic obstructive pulmonary disease) ([place]) 05/23/2012  . Hyperlipidemia 05/23/2012    T. Chol 234, LDL 130   . Traumatic arthritis   . Diverticulosis   . Hiatal hernia   . GERD (gastroesophageal reflux disease)   . IBS (irritable bowel syndrome)   . Esophageal stricture   . Esophageal diverticulum   . Anxiety   . Osteopenia   . DJD (degenerative joint disease)   . Allergic rhinitis     Past Surgical History  Procedure Laterality Date  . Lung biopsy Left     left side, not cancerous  . Cervical spine surgery    . Vaginal hysterectomy      Social History [name]  reports that she quit smoking about 30 years ago. Her smoking use included Cigarettes. She has never used smokeless tobacco. She reports that she drinks alcohol. She reports that she does not use illicit drugs.  family history includes Blindness in her sister;  Breast cancer in her maternal aunt; Hypertension in her sister and sister; Throat cancer in her father; Thyroid disease in her daughter. There is no history of Colon cancer or Stomach cancer.  Allergies  Allergen Reactions  . Ace Inhibitors Anaphylaxis    REACTION: angioedema  . Other Anaphylaxis    Calcium Channel Blocking Agent Diltiazem Analogues  . Pneumococcal Vaccines     Red rash  Knot (sore and itches) A fever for a couple days (per pt report)  . Tetanus Toxoid     Cellulitis in arm       PHYSICAL EXAMINATION: Vital signs: BP 116/70 mmHg  Pulse 76  Ht 5\' 2"  (1.575 m)  Wt 172 lb 2 oz (78.075 kg)  BMI 31.47 kg/m2  Constitutional: generally well-appearing, no acute distress Psychiatric: alert and oriented x3, cooperative Eyes: extraocular movements intact, anicteric, conjunctiva pink Mouth: oral pharynx moist, no lesions Neck: supple without thyromegaly Lymph: no lymphadenopathy Cardiovascular: heart regular rate and rhythm, no murmur Lungs: clear to auscultation bilaterally Abdomen: soft, obese, nontender, nondistended, no obvious ascites, no peritoneal signs, normal bowel sounds, no organomegaly Rectal: Ommitted, Extremities: no clubbing cyanosis or lower extremity edema bilaterally Skin: no lesions on visible extremities Neuro: No focal deficits. Cranial nerves intact.  ASSESSMENT:  #1. Progressive solid food dysphagia as described. 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 Though both of these may result in dysphagia, the patient clearly points to her right neck. She could have a  Zenker's diverticulum or hypertensive upper esophageal sphincter. Scarring from prior cervical neck surgery is also a consideration. #2. GERD. No classic symptoms on PPI #3. Mild diverticulosis on colonoscopy 2014. Otherwise negative   PLAN:  #1. Upper endoscopy with possible esophageal dilation.The nature of the procedure, as well as  the risks, benefits, and alternatives were carefully and thoroughly reviewed with the patient. Ample time for discussion and questions allowed. The patient understood, was satisfied, and agreed to proceed. #2. May benefit from barium radiology pending the outcome of endoscopy #3. Continue PPI and reflux precautions #4. Repeat screening colonoscopy 2024  40 minutes has been spent face-to-face with the patient. Greater than 50% of the time use for counseling regarding her dysphagia, the differential diagnosis, and plan workup and therapeutic options  A copy of this consultation has been sent to Dr. Jenny Reichmann

## 2015-05-05 NOTE — Patient Instructions (Signed)

## 2015-05-06 ENCOUNTER — Encounter: Payer: Self-pay | Admitting: Internal Medicine

## 2015-05-06 ENCOUNTER — Ambulatory Visit (AMBULATORY_SURGERY_CENTER): Payer: Commercial Managed Care - HMO | Admitting: Internal Medicine

## 2015-05-06 VITALS — BP 131/69 | HR 66 | Temp 97.0°F | Resp 17 | Ht 62.0 in | Wt 172.0 lb

## 2015-05-06 DIAGNOSIS — K222 Esophageal obstruction: Secondary | ICD-10-CM

## 2015-05-06 DIAGNOSIS — R1310 Dysphagia, unspecified: Secondary | ICD-10-CM | POA: Diagnosis present

## 2015-05-06 DIAGNOSIS — K219 Gastro-esophageal reflux disease without esophagitis: Secondary | ICD-10-CM

## 2015-05-06 DIAGNOSIS — Q396 Congenital diverticulum of esophagus: Secondary | ICD-10-CM

## 2015-05-06 DIAGNOSIS — I10 Essential (primary) hypertension: Secondary | ICD-10-CM | POA: Diagnosis not present

## 2015-05-06 DIAGNOSIS — K2990 Gastroduodenitis, unspecified, without bleeding: Secondary | ICD-10-CM | POA: Diagnosis not present

## 2015-05-06 DIAGNOSIS — K2970 Gastritis, unspecified, without bleeding: Secondary | ICD-10-CM | POA: Diagnosis not present

## 2015-05-06 DIAGNOSIS — J449 Chronic obstructive pulmonary disease, unspecified: Secondary | ICD-10-CM | POA: Diagnosis not present

## 2015-05-06 MED ORDER — SODIUM CHLORIDE 0.9 % IV SOLN: 500.0000 mL | INTRAVENOUS | Status: DC

## 2015-05-06 NOTE — Progress Notes (Signed)
Patient awakening,vss,report to rn 

## 2015-05-06 NOTE — Progress Notes (Signed)
Called to room to assist during endoscopic procedure.  Patient ID and intended procedure confirmed with present staff. Received instructions for my participation in the procedure from the performing physician.  

## 2015-05-06 NOTE — Op Note (Signed)
[facility]  [address] [place], [address]   ENDOSCOPY PROCEDURE REPORT  PATIENT: Alajia, Tait  MR#: [id number] BIRTHDATE: 09/20/1945 , 69  yrs. old GENDER: female ENDOSCOPIST: Eustace Quail, MD REFERRED BY:  [name], M.D. PROCEDURE DATE:  05/06/2015 PROCEDURE:  EGD w/ biopsy ASA CLASS:     Class II INDICATIONS:  dysphagia. Complains with solids passing the pharyngeal region, particularly on the right. MEDICATIONS: Monitored anesthesia care and Propofol 150 mg IV TOPICAL ANESTHETIC: none  DESCRIPTION OF PROCEDURE: After the risks benefits and alternatives of the procedure were thoroughly explained, informed consent was obtained.  The LB [id number] endoscope was introduced through the mouth and advanced to the second portion of the duodenum , Without limitations.  The instrument was slowly withdrawn as the mucosa was fully examined.  EXAM:No resistance at the UES.  No obvious Zenker's.  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  The gastroesophageal junction was at 38 cm from the teeth.  The stomach revealed antral erythema but was otherwise normal.  CLO biopsy taken.  The duodenum was normal.  Retroflexed views revealed a hiatal hernia.     The scope was then withdrawn from the patient and the procedure completed.  COMPLICATIONS: There were no immediate complications.  ENDOSCOPIC IMPRESSION: 1. Epiphrenic diverticulum 2. Mild antral erythema status post CLO test  RECOMMENDATIONS: 1.  Continue current medications 2.  My office will arrange for you to have a Barium Esophagram with tablet performed.  This is a radiology test to examine your esophagus "dysphagia, epiphrenic esophageal diverticulum, rule out Zenker's".  We will contact you with the results when available with further recommendations.  REPEAT EXAM:  eSigned:  Eustace Quail, MD 05/06/2015 11:30 AM    CC:The Patient and Biagio Borg, MD

## 2015-05-06 NOTE — Patient Instructions (Addendum)
YOU HAD AN ENDOSCOPIC PROCEDURE TODAY AT [facility]:   Refer to the procedure report that was given to you for any specific questions about what was found during the examination.  If the procedure report does not answer your questions, please call your gastroenterologist to clarify.  If you requested that your care partner not be given the details of your procedure findings, then the procedure report has been included in a sealed envelope for you to review at your convenience later.  YOU SHOULD EXPECT: Some feelings of bloating in the abdomen. Passage of more gas than usual.  Walking can help get rid of the air that was put into your GI tract during the procedure and reduce the bloating. If you had a lower endoscopy (such as a colonoscopy or flexible sigmoidoscopy) you may notice spotting of blood in your stool or on the toilet paper. If you underwent a bowel prep for your procedure, you may not have a normal bowel movement for a few days.  Please Note:  You might notice some irritation and congestion in your nose or some drainage.  This is from the oxygen used during your procedure.  There is no need for concern and it should clear up in a day or so.  SYMPTOMS TO REPORT IMMEDIATELY:     Following upper endoscopy (EGD)  Vomiting of blood or coffee ground material  New chest pain or pain under the shoulder blades  Painful or persistently difficult swallowing  New shortness of breath  Fever of 100F or higher  Black, tarry-looking stools  For urgent or emergent issues, a gastroenterologist can be reached at any hour by calling [phone].   DIET: Your first meal following the procedure should be a small meal and then it is ok to progress to your normal diet. Heavy or fried foods are harder to digest and may make you feel nauseous or bloated.  Likewise, meals heavy in dairy and vegetables can increase bloating.  Drink plenty of fluids but you should avoid alcoholic beverages  for 24 hours.  ACTIVITY:  You should plan to take it easy for the rest of today and you should NOT DRIVE or use heavy machinery until tomorrow (because of the sedation medicines used during the test).    FOLLOW UP: Our staff will call the number listed on your records the next business day following your procedure to check on you and address any questions or concerns that you may have regarding the information given to you following your procedure. If we do not reach you, we will leave a message.  However, if you are feeling well and you are not experiencing any problems, there is no need to return our call.  We will assume that you have returned to your regular daily activities without incident.  If any biopsies were taken you will be contacted by phone or by letter within the next 1-3 weeks.  Please call us at [phone] if you have not heard about the biopsies in 3 weeks.    SIGNATURES/CONFIDENTIALITY: You and/or your care partner have signed paperwork which will be entered into your electronic medical record.  These signatures attest to the fact that that the information above on your After Visit Summary has been reviewed and is understood.  Full responsibility of the confidentiality of this discharge information lies with you and/or your care-partner.   Barium Esophagram will be scheduled and Dr Blanch Media office will call you with date,time and details  Continue current medications

## 2015-05-07 ENCOUNTER — Telehealth: Payer: Self-pay

## 2015-05-07 LAB — HELICOBACTER PYLORI SCREEN-BIOPSY: UREASE: NEGATIVE

## 2015-05-07 NOTE — Telephone Encounter (Signed)
  Follow up Call-  Call back number 05/06/2015  Post procedure Call Back phone  # [phone]  Permission to leave phone message Yes     Patient questions:  Do you have a fever, pain , or abdominal swelling? No. Pain Score  0 *  Have you tolerated food without any problems? Yes.    Have you been able to return to your normal activities? Yes.    Do you have any questions about your discharge instructions: Diet   No. Medications  No. Follow up visit  No.  Do you have questions or concerns about your Care? No.  Actions: * If pain score is 4 or above: No action needed, pain <4.  No problems noted per the pt. maw

## 2015-05-08 ENCOUNTER — Telehealth: Payer: Self-pay

## 2015-05-08 DIAGNOSIS — R1310 Dysphagia, unspecified: Secondary | ICD-10-CM

## 2015-05-08 NOTE — Telephone Encounter (Signed)
Scheduled Barium Swallow with tablet per Dr. [name] at [facility] on 05/15/2015 at 11:30, arrive at 11:15am, npo 3 hours.  Called patient and relayed this information to her.  She acknowledged and understood

## 2015-05-15 ENCOUNTER — Ambulatory Visit (HOSPITAL_COMMUNITY)
Admission: RE | Admit: 2015-05-15 | Discharge: 2015-05-15 | Disposition: A | Discharge status: Home / Self Care | Payer: Commercial Managed Care - HMO | Source: Ambulatory Visit | Attending: Internal Medicine | Admitting: Internal Medicine

## 2015-05-15 DIAGNOSIS — K219 Gastro-esophageal reflux disease without esophagitis: Secondary | ICD-10-CM | POA: Diagnosis not present

## 2015-05-15 DIAGNOSIS — Z981 Arthrodesis status: Secondary | ICD-10-CM | POA: Diagnosis not present

## 2015-05-15 DIAGNOSIS — R1310 Dysphagia, unspecified: Secondary | ICD-10-CM | POA: Diagnosis not present

## 2015-05-15 DIAGNOSIS — R6889 Other general symptoms and signs: Secondary | ICD-10-CM | POA: Diagnosis not present

## 2015-05-15 DIAGNOSIS — M5030 Other cervical disc degeneration, unspecified cervical region: Secondary | ICD-10-CM | POA: Insufficient documentation

## 2015-05-15 DIAGNOSIS — K5790 Diverticulosis of intestine, part unspecified, without perforation or abscess without bleeding: Secondary | ICD-10-CM | POA: Diagnosis not present

## 2015-05-18 ENCOUNTER — Telehealth: Payer: Self-pay | Admitting: Internal Medicine

## 2015-05-18 DIAGNOSIS — R6884 Jaw pain: Secondary | ICD-10-CM

## 2015-05-18 NOTE — Telephone Encounter (Signed)
Pt called requeste referral to go to ENT for jaw pain (when chew food). Please help

## 2015-05-18 NOTE — Telephone Encounter (Signed)
Referral done

## 2015-05-21 DIAGNOSIS — H6123 Impacted cerumen, bilateral: Secondary | ICD-10-CM | POA: Diagnosis not present

## 2015-05-21 DIAGNOSIS — R1313 Dysphagia, pharyngeal phase: Secondary | ICD-10-CM | POA: Diagnosis not present

## 2015-07-03 ENCOUNTER — Encounter: Payer: Self-pay | Admitting: Internal Medicine

## 2015-07-31 ENCOUNTER — Encounter: Payer: Self-pay | Admitting: Internal Medicine

## 2015-07-31 DIAGNOSIS — L989 Disorder of the skin and subcutaneous tissue, unspecified: Secondary | ICD-10-CM

## 2015-08-06 DIAGNOSIS — L738 Other specified follicular disorders: Secondary | ICD-10-CM | POA: Diagnosis not present

## 2015-08-06 DIAGNOSIS — L218 Other seborrheic dermatitis: Secondary | ICD-10-CM | POA: Diagnosis not present

## 2015-08-07 ENCOUNTER — Telehealth: Payer: Self-pay

## 2015-08-07 NOTE — Telephone Encounter (Signed)
Spoke to pt and pt declined the flu vaccine.

## 2015-09-10 ENCOUNTER — Ambulatory Visit (INDEPENDENT_AMBULATORY_CARE_PROVIDER_SITE_OTHER): Payer: Commercial Managed Care - HMO | Admitting: Internal Medicine

## 2015-09-10 ENCOUNTER — Other Ambulatory Visit (INDEPENDENT_AMBULATORY_CARE_PROVIDER_SITE_OTHER): Payer: Commercial Managed Care - HMO

## 2015-09-10 ENCOUNTER — Encounter: Payer: Self-pay | Admitting: Internal Medicine

## 2015-09-10 VITALS — BP 140/82 | HR 64 | Temp 98.8°F | Resp 20 | Wt 172.0 lb

## 2015-09-10 DIAGNOSIS — M545 Low back pain, unspecified: Secondary | ICD-10-CM

## 2015-09-10 DIAGNOSIS — R6889 Other general symptoms and signs: Secondary | ICD-10-CM

## 2015-09-10 DIAGNOSIS — J449 Chronic obstructive pulmonary disease, unspecified: Secondary | ICD-10-CM

## 2015-09-10 DIAGNOSIS — Z0001 Encounter for general adult medical examination with abnormal findings: Secondary | ICD-10-CM

## 2015-09-10 DIAGNOSIS — R350 Frequency of micturition: Secondary | ICD-10-CM | POA: Diagnosis not present

## 2015-09-10 DIAGNOSIS — I10 Essential (primary) hypertension: Secondary | ICD-10-CM

## 2015-09-10 DIAGNOSIS — E785 Hyperlipidemia, unspecified: Secondary | ICD-10-CM

## 2015-09-10 LAB — CBC WITH DIFFERENTIAL/PLATELET
BASOS ABS: 0 10*3/uL (ref 0.0–0.1)
Basophils Relative: 1 % (ref 0.0–3.0)
Eosinophils Absolute: 0.4 10*3/uL (ref 0.0–0.7)
Eosinophils Relative: 8 % — ABNORMAL HIGH (ref 0.0–5.0)
HEMATOCRIT: 37.5 % (ref 36.0–46.0)
HEMOGLOBIN: 12.3 g/dL (ref 12.0–15.0)
LYMPHS PCT: 28.6 % (ref 12.0–46.0)
Lymphs Abs: 1.4 10*3/uL (ref 0.7–4.0)
MCHC: 32.9 g/dL (ref 30.0–36.0)
MCV: 92.7 fl (ref 78.0–100.0)
MONOS PCT: 10 % (ref 3.0–12.0)
Monocytes Absolute: 0.5 10*3/uL (ref 0.1–1.0)
NEUTROS ABS: 2.5 10*3/uL (ref 1.4–7.7)
Neutrophils Relative %: 52.4 % (ref 43.0–77.0)
PLATELETS: 247 10*3/uL (ref 150.0–400.0)
RBC: 4.04 Mil/uL (ref 3.87–5.11)
RDW: 14.7 % (ref 11.5–15.5)
WBC: 4.8 10*3/uL (ref 4.0–10.5)

## 2015-09-10 LAB — LIPID PANEL
CHOL/HDL RATIO: 3
Cholesterol: 174 mg/dL (ref 0–200)
HDL: 66.6 mg/dL (ref >39.00)
LDL CALC: 86 mg/dL (ref 0–99)
NonHDL: 107.07
Triglycerides: 103 mg/dL (ref 0.0–149.0)
VLDL: 20.6 mg/dL (ref 0.0–40.0)

## 2015-09-10 LAB — BASIC METABOLIC PANEL
BUN: 13 mg/dL (ref 6–23)
CO2: 30 mEq/L (ref 19–32)
Calcium: 9.3 mg/dL (ref 8.4–10.5)
Chloride: 105 mEq/L (ref 96–112)
Creatinine, Ser: 0.93 mg/dL (ref 0.40–1.20)
GFR: 76.21 mL/min (ref >60.00)
GLUCOSE: 87 mg/dL (ref 70–99)
Potassium: 3.9 mEq/L (ref 3.5–5.1)
Sodium: 142 mEq/L (ref 135–145)

## 2015-09-10 LAB — HEPATIC FUNCTION PANEL
ALBUMIN: 4.1 g/dL (ref 3.5–5.2)
ALK PHOS: 131 U/L — AB (ref 39–117)
ALT: 17 U/L (ref 0–35)
AST: 21 U/L (ref 0–37)
BILIRUBIN DIRECT: 0.1 mg/dL (ref 0.0–0.3)
TOTAL PROTEIN: 7.4 g/dL (ref 6.0–8.3)
Total Bilirubin: 0.5 mg/dL (ref 0.2–1.2)

## 2015-09-10 LAB — TSH: TSH: 1.37 u[IU]/mL (ref 0.35–4.50)

## 2015-09-10 MED ORDER — CEPHALEXIN 500 MG PO CAPS: 500.0000 mg | ORAL_CAPSULE | Freq: Four times a day (QID) | ORAL | Status: DC

## 2015-09-10 NOTE — Patient Instructions (Signed)
Please take all new medication as prescribed - the antibiotic  Please continue all other medications as before, and refills have been done if requested.  Please have the pharmacy call with any other refills you may need.  Please continue your efforts at being more active, low cholesterol diet, and weight control.  You are otherwise up to date with prevention measures today.  Please keep your appointments with your specialists as you may have planned  Please go to the LAB in the Basement (turn left off the elevator) for the tests to be done today  You will be contacted by phone if any changes need to be made immediately.  Otherwise, you will receive a letter about your results with an explanation, but please check with MyChart first.  Please remember to sign up for MyChart if you have not done so, as this will be important to you in the future with finding out test results, communicating by private email, and scheduling acute appointments online when needed.  Please return in 6 months, or sooner if needed

## 2015-09-10 NOTE — Progress Notes (Signed)
Pre visit review using our clinic review tool, if applicable. No additional management support is needed unless otherwise documented below in the visit note. 

## 2015-09-10 NOTE — Progress Notes (Signed)
Subjective:    Patient ID: [name], female    DOB: [birth year], 70 y.o.   MRN: [id number]  HPI  Here for wellness and f/u;  Overall doing ok;  Pt denies Chest pain, worsening SOB, DOE, wheezing, orthopnea, PND, worsening LE edema, palpitations, dizziness or syncope.  Pt denies neurological change such as new headache, facial or extremity weakness.  Pt denies polydipsia, polyuria, or low sugar symptoms. Pt states overall good compliance with treatment and medications, good tolerability, and has been trying to follow appropriate diet.  Pt denies worsening depressive symptoms, suicidal ideation or panic. No fever, night sweats, wt loss, loss of appetite, or other constitutional symptoms.  Pt states good ability with ADL's, has low fall risk, home safety reviewed and adequate, no other significant changes in hearing or vision, and only occasionally active with exercise.  Pt continues to have recurring left > right LBP without change in severity, bowel or bladder change, fever, wt loss,  worsening LE pain/numbness/weakness, gait change or falls, but worse to stand, better to sit, nothing else makes better or worse.  Until recently had no GU symptoms - Denies urinary symptoms such as dysuria, frequency, urgency, flank pain, hematuria or n/v, fever, chills, until last wk with gradual worsening urinary freq and incontinence.  Past Medical History  Diagnosis Date  . Hypertension   . COPD (chronic obstructive pulmonary disease) ([place]) 05/23/2012  . Hyperlipidemia 05/23/2012    T. Chol 234, LDL 130   . Traumatic arthritis   . Diverticulosis   . Hiatal hernia   . GERD (gastroesophageal reflux disease)   . IBS (irritable bowel syndrome)   . Esophageal stricture   . Esophageal diverticulum   . Anxiety   . Osteopenia   . DJD (degenerative joint disease)   . Allergic rhinitis    Past Surgical History  Procedure Laterality Date  . Lung biopsy Left     left side, not cancerous  . Cervical spine  surgery    . Vaginal hysterectomy      reports that she quit smoking about 30 years ago. Her smoking use included Cigarettes. She has never used smokeless tobacco. She reports that she drinks alcohol. She reports that she does not use illicit drugs. family history includes Blindness in her sister; Breast cancer in her maternal aunt; Hypertension in her sister and sister; Throat cancer in her father; Thyroid disease in her daughter. There is no history of Colon cancer or Stomach cancer. Allergies  Allergen Reactions  . Ace Inhibitors Anaphylaxis    REACTION: angioedema  . Other Anaphylaxis    Calcium Channel Blocking Agent Diltiazem Analogues  . Pneumococcal Vaccines     Red rash  Knot (sore and itches) A fever for a couple days (per pt report)  . Tetanus Toxoid     Cellulitis in arm   Current Outpatient Prescriptions on File Prior to Visit  Medication Sig Dispense Refill  . amLODipine (NORVASC) 5 MG tablet take 1 tablet by mouth once daily 90 tablet 2  . aspirin EC 81 MG tablet Take 81 mg by mouth daily.    [name] atorvastatin (LIPITOR) 10 MG tablet take 1 tablet by mouth once daily 90 tablet 3  . buPROPion (WELLBUTRIN SR) 100 MG 12 hr tablet Take 1 tablet (100 mg total) by mouth 2 (two) times daily. 180 tablet 1  . pantoprazole (PROTONIX) 40 MG tablet take 1 tablet by mouth once daily 90 tablet 3  . venlafaxine XR (EFFEXOR-XR) 75 MG  24 hr capsule Take 1 capsule by mouth daily.  0   No current facility-administered medications on file prior to visit.   Review of Systems Constitutional: Negative for increased diaphoresis, or other activity, appetite or siginficant weight change other than noted HENT: Negative for worsening hearing loss, ear pain, facial swelling, mouth sores and neck stiffness.   Eyes: Negative for other worsening pain, redness or visual disturbance.  Respiratory: Negative for choking or stridor Cardiovascular: Negative for other chest pain and palpitations.    Gastrointestinal: Negative for worsening diarrhea, blood in stool, or abdominal distention Genitourinary: Negative for hematuria, flank pain or change in urine volume.  Musculoskeletal: Negative for myalgias or other joint complaints.  Skin: Negative for other color change and wound or drainage.  Neurological: Negative for syncope and numbness. other than noted Hematological: Negative for adenopathy. or other swelling Psychiatric/Behavioral: Negative for hallucinations, SI, self-injury, decreased concentration or other worsening agitation.       Objective:   Physical Exam BP 140/82 mmHg  Pulse 64  Temp(Src) 98.8 F (37.1 C) (Oral)  Resp 20  Wt 172 lb (78.019 kg)  SpO2 93% VS noted,  Constitutional: Pt is oriented to person, place, and time. Appears well-developed and well-nourished, in no significant distress Head: Normocephalic and atraumatic  Eyes: Conjunctivae and EOM are normal. Pupils are equal, round, and reactive to light Right Ear: External ear normal.  Left Ear: External ear normal Nose: Nose normal.  Mouth/Throat: Oropharynx is clear and moist  Neck: Normal range of motion. Neck supple. No JVD present. No tracheal deviation present or significant neck LA or mass Cardiovascular: Normal rate, regular rhythm, normal heart sounds and intact distal pulses.  Pulmonary/Chest: Effort normal and breath sounds without rales or wheezing  Abdominal: Soft. Bowel sounds are normal. NT. No HSM except for low mid abd tender without guarding or rebound Musculoskeletal: Normal range of motion. Exhibits no edema Lymphadenopathy: Has no cervical adenopathy.  Neurological: Pt is alert and oriented to person, place, and time. Pt has normal reflexes. No cranial nerve deficit. Motor grossly intact Skin: Skin is warm and dry. No rash noted or new ulcers Psychiatric:  Has normal mood and affect. Behavior is normal.  Spine: nontender + left lumbar paravertebral spasm/tender    Assessment &  Plan:

## 2015-09-12 LAB — URINE CULTURE
Colony Count: NO GROWTH
ORGANISM ID, BACTERIA: NO GROWTH

## 2015-09-12 NOTE — Assessment & Plan Note (Signed)
stable overall by history and exam, recent data reviewed with pt, and pt to continue medical treatment as before,  to f/u any worsening symptoms or concerns SpO2 Readings from Last 3 Encounters:  09/10/15 93%  05/06/15 94%  02/24/15 96%

## 2015-09-12 NOTE — Assessment & Plan Note (Signed)
stable overall by history and exam, recent data reviewed with pt, and pt to continue medical treatment as before,  to f/u any worsening symptoms or concerns BP Readings from Last 3 Encounters:  09/10/15 140/82  05/06/15 131/69  05/05/15 116/70

## 2015-09-12 NOTE — Assessment & Plan Note (Signed)

## 2015-09-12 NOTE — Assessment & Plan Note (Signed)
stable overall by history and exam, recent data reviewed with pt, and pt to continue medical treatment as before,  to f/u any worsening symptoms or concerns Lab Results  Component Value Date   LDLCALC 86 09/10/2015

## 2015-09-12 NOTE — Assessment & Plan Note (Addendum)
Afeb, but with incontinence and low abd tender, likely UTI by exam, for ua and cx, empiric antibx,  to f/u any worsening symptoms or concerns In addition to the time spent performing CPE, I spent an additional 40 minutes face to face,in which greater than 50% of this time was spent in counseling and coordination of care for patient's acute illness as documented.

## 2015-09-12 NOTE — Assessment & Plan Note (Signed)
C/w msk strain, though cant completley r/o element of UTi related, no neuro changes, for pain control,  to f/u any worsening symptoms or concerns

## 2015-09-22 ENCOUNTER — Telehealth: Payer: Self-pay

## 2015-09-22 NOTE — Telephone Encounter (Signed)
Patient called to inform you that with her insurance they want her to do a mail order for her RX. [employer] will be sending a fax over for the request.

## 2015-10-14 DIAGNOSIS — M25552 Pain in left hip: Secondary | ICD-10-CM | POA: Diagnosis not present

## 2015-10-14 DIAGNOSIS — I10 Essential (primary) hypertension: Secondary | ICD-10-CM | POA: Diagnosis not present

## 2015-10-14 DIAGNOSIS — M545 Low back pain: Secondary | ICD-10-CM | POA: Diagnosis not present

## 2015-10-14 DIAGNOSIS — E782 Mixed hyperlipidemia: Secondary | ICD-10-CM | POA: Diagnosis not present

## 2015-10-16 ENCOUNTER — Other Ambulatory Visit: Payer: Self-pay

## 2015-10-16 MED ORDER — ATORVASTATIN CALCIUM 10 MG PO TABS: 10.0000 mg | ORAL_TABLET | Freq: Every day | ORAL | Status: DC

## 2015-10-20 ENCOUNTER — Other Ambulatory Visit: Payer: Self-pay | Admitting: *Deleted

## 2015-10-20 DIAGNOSIS — F341 Dysthymic disorder: Secondary | ICD-10-CM

## 2015-10-20 MED ORDER — BUPROPION HCL ER (SR) 100 MG PO TB12: 100.0000 mg | ORAL_TABLET | Freq: Two times a day (BID) | ORAL | Status: DC

## 2015-10-20 MED ORDER — ATORVASTATIN CALCIUM 10 MG PO TABS: 10.0000 mg | ORAL_TABLET | Freq: Every day | ORAL | Status: DC

## 2015-10-20 MED ORDER — PANTOPRAZOLE SODIUM 40 MG PO TBEC: 40.0000 mg | DELAYED_RELEASE_TABLET | Freq: Every day | ORAL | Status: DC

## 2015-10-20 MED ORDER — AMLODIPINE BESYLATE 5 MG PO TABS: 5.0000 mg | ORAL_TABLET | Freq: Every day | ORAL | Status: DC

## 2015-10-20 NOTE — Telephone Encounter (Signed)
Pt left msg on triage stating she is needing new rx's sent to [facility] will be getting meds through mail. Notified pt rx's sent electronically...Johny Chess

## 2015-10-28 DIAGNOSIS — I10 Essential (primary) hypertension: Secondary | ICD-10-CM | POA: Diagnosis not present

## 2015-10-28 DIAGNOSIS — M545 Low back pain: Secondary | ICD-10-CM | POA: Diagnosis not present

## 2015-10-28 DIAGNOSIS — E782 Mixed hyperlipidemia: Secondary | ICD-10-CM | POA: Diagnosis not present

## 2015-10-28 DIAGNOSIS — M25552 Pain in left hip: Secondary | ICD-10-CM | POA: Diagnosis not present

## 2015-11-28 ENCOUNTER — Ambulatory Visit (HOSPITAL_COMMUNITY)
Admission: EM | Admit: 2015-11-28 | Discharge: 2015-11-28 | Disposition: A | Discharge status: Home / Self Care | Payer: Commercial Managed Care - HMO | Attending: Family Medicine | Admitting: Family Medicine

## 2015-11-28 ENCOUNTER — Encounter (HOSPITAL_COMMUNITY): Payer: Self-pay | Admitting: Emergency Medicine

## 2015-11-28 DIAGNOSIS — S0012XA Contusion of left eyelid and periocular area, initial encounter: Secondary | ICD-10-CM

## 2015-11-28 DIAGNOSIS — S01511A Laceration without foreign body of lip, initial encounter: Secondary | ICD-10-CM

## 2015-11-28 DIAGNOSIS — W19XXXA Unspecified fall, initial encounter: Secondary | ICD-10-CM

## 2015-11-28 DIAGNOSIS — Y92009 Unspecified place in unspecified non-institutional (private) residence as the place of occurrence of the external cause: Principal | ICD-10-CM

## 2015-11-28 MED ORDER — HYDROCODONE-ACETAMINOPHEN 5-325 MG PO TABS: 2.0000 | ORAL_TABLET | ORAL | Status: DC | PRN

## 2015-11-28 NOTE — ED Notes (Signed)
Tripped and fell.  Patient has a laceration to lower lip.  Small laceration to outer lip.

## 2015-11-28 NOTE — ED Provider Notes (Signed)
CSN: [id number]     Arrival date & time 11/28/15  1504 History   None    Chief Complaint  Patient presents with  . Fall  . Lip Laceration    Patient is a 70 y.o. female presenting with fall. The history is provided by the patient.  Fall This is a new problem. The current episode started 1 to 2 hours ago. The problem has not changed since onset.Pertinent negatives include no chest pain, no abdominal pain, no headaches and no shortness of breath.  Pt states approx 2 hrs ago she was walking and tripped and fell. She struck her face. Now w/ laceration to inner aspect of lower lip, contusion to (L) eyebrow and bil knee pain. Denies dizziness, CP or weakness prior to fall.  Past Medical History  Diagnosis Date  . Hypertension   . COPD (chronic obstructive pulmonary disease) ([place]) 05/23/2012  . Hyperlipidemia 05/23/2012    T. Chol 234, LDL 130   . Traumatic arthritis   . Diverticulosis   . Hiatal hernia   . GERD (gastroesophageal reflux disease)   . IBS (irritable bowel syndrome)   . Esophageal stricture   . Esophageal diverticulum   . Anxiety   . Osteopenia   . DJD (degenerative joint disease)   . Allergic rhinitis    Past Surgical History  Procedure Laterality Date  . Lung biopsy Left     left side, not cancerous  . Cervical spine surgery    . Vaginal hysterectomy     Family History  Problem Relation Age of Onset  . Throat cancer Father   . Hypertension Sister   . Colon cancer Neg Hx   . Stomach cancer Neg Hx   . Thyroid disease Daughter   . Hypertension Sister     x 2  . Breast cancer Maternal Aunt   . Blindness Sister     legally blind   Social History  Substance Use Topics  . Smoking status: Former Smoker    Types: Cigarettes    Quit date: 05/04/1985  . Smokeless tobacco: Never Used  . Alcohol Use: 0.0 oz/week    0 Standard drinks or equivalent per week     Comment: occasional   OB History    No data available     Review of Systems  Respiratory: Negative  for shortness of breath.   Cardiovascular: Negative for chest pain.  Gastrointestinal: Negative for abdominal pain.  Neurological: Negative for headaches.  All other systems reviewed and are negative.   Allergies  Ace inhibitors; Other; Pneumococcal vaccines; and Tetanus toxoid  Home Medications   Prior to Admission medications   Medication Sig Start Date End Date Taking? Authorizing Provider  amLODipine (NORVASC) 5 MG tablet Take 1 tablet (5 mg total) by mouth daily. 10/20/15   Biagio Borg, MD  aspirin EC 81 MG tablet Take 81 mg by mouth daily.    Historical Provider, MD  atorvastatin (LIPITOR) 10 MG tablet Take 1 tablet (10 mg total) by mouth daily. 10/20/15   Biagio Borg, MD  buPROPion (WELLBUTRIN SR) 100 MG 12 hr tablet Take 1 tablet (100 mg total) by mouth 2 (two) times daily. 10/20/15   Biagio Borg, MD  cephALEXin (KEFLEX) 500 MG capsule Take 1 capsule (500 mg total) by mouth 4 (four) times daily. 09/10/15   Biagio Borg, MD  HYDROcodone-acetaminophen (NORCO/VICODIN) 5-325 MG tablet Take 2 tablets by mouth every 4 (four) hours as needed for moderate pain or severe  pain. 11/28/15   Rhetta Mura Matie Dimaano, NP  pantoprazole (PROTONIX) 40 MG tablet Take 1 tablet (40 mg total) by mouth daily. 10/20/15   Biagio Borg, MD  venlafaxine XR (EFFEXOR-XR) 75 MG 24 hr capsule Take 1 capsule by mouth daily. 01/19/15   Historical Provider, MD   Meds Ordered and Administered this Visit  Medications - No data to display  BP 156/96 mmHg  Pulse 84  Temp(Src) 99 F (37.2 C) (Oral)  Resp 12  SpO2 99% No data found.   Physical Exam  Constitutional: She appears well-developed and well-nourished.  HENT:  Head: Normocephalic.  Approx 3 cm contusion to outer (L) eyebrow. No open wound. No crepitus. EOM intact.   Approx 1 cm lac to inner aspect of lower lip. Laceration is not through and through, no active bleeding.   Musculoskeletal:  No swelling, open wound or deformity to either bil knee.     ED  Course  Procedures (including critical care time)  Labs Review Labs Reviewed - No data to display  Imaging Review No results found.   Visual Acuity Review  Right Eye Distance:   Left Eye Distance:   Bilateral Distance:    Right Eye Near:   Left Eye Near:    Bilateral Near:         MDM   1. Fall at home, initial encounter   2. Lip laceration, initial encounter   3. Contusion of eyebrow, left, initial encounter   Pt can not take DT (allergic). Recommended ice to eye and mouth. Salt water gargles for lip lac. Provided Rx for a short course of Norco for pain to use over next 24-48 hrs. Home care discussed and provided in print.    Jeryl Columbia, NP 11/28/15 1640

## 2015-11-28 NOTE — Discharge Instructions (Signed)
Mouth Laceration A mouth laceration is a deep cut in the lining of your mouth (mucosa). The laceration may extend into your lip or go all of the way through your mouth and cheek. Lacerations inside your mouth may involve your tongue, the insides of your cheeks, or the upper surface of your mouth (palate). Mouth lacerations may bleed a lot because your mouth has a very rich blood supply. Mouth lacerations may need to be repaired with stitches (sutures). CAUSES Any type of facial injury can cause a mouth laceration. Common causes include:  Getting hit in the mouth.  Being in a car accident. SYMPTOMS The most common sign of a mouth laceration is bleeding that fills the mouth. DIAGNOSIS Your health care provider can diagnose a mouth laceration by examining your mouth. Your mouth may need to be washed out (irrigated) with a sterile salt-water (saline) solution. Your health care provider may also have to remove any blood clots to determine how bad your injury is. You may need X-rays of the bones in your jaw or your face to rule out other injuries, such as dental injuries, facial fractures, or jaw fractures. TREATMENT Treatment depends on the location and severity of your injury. Small mouth lacerations may not need treatment if bleeding has stopped. You may need sutures if:  You have a tongue laceration.  Your mouth laceration is large or deep, or it continues to bleed. If sutures are necessary, your health care provider will use absorbable sutures that dissolve as your body heals. You may also receive antibiotic medicine or a tetanus shot. HOME CARE INSTRUCTIONS  Take medicines only as directed by your health care provider.  If you were prescribed an antibiotic medicine, finish all of it even if you start to feel better.  Eat as directed by your health care provider. You may only be able to drink liquids or eat soft foods for a few days.  Rinse your mouth with a warm, salt-water rinse 4-6  times per day or as directed by your health care provider. You can make a salt-water rinse by mixing one tsp of salt into two cups of warm water.  Do not poke the sutures with your tongue. Doing that can loosen them.  Check your wound every day for signs of infection. It is normal to have a white or gray patch over your wound while it heals. Watch for:  Redness.  Swelling.  Blood or pus.  Maintain regular oral hygiene, if possible. Gently brush your teeth with a soft, nylon-bristled toothbrush 2 times per day.  Keep all follow-up visits as directed by your health care provider. This is important. SEEK MEDICAL CARE IF:  You were given a tetanus shot and have swelling, severe pain, redness, or bleeding at the injection site.  You have a fever.  Your pain is not controlled with medicine.  You have redness, swelling, or pain at your wound that is getting worse.  You have fresh bleeding or pus coming from your wound.  The edges of your wound break open.  You develop swollen, tender glands in your throat. SEEK IMMEDIATE MEDICAL CARE IF:   Your face or the area under your jaw becomes swollen.  You have trouble breathing or swallowing.   This information is not intended to replace advice given to you by your health care provider. Make sure you discuss any questions you have with your health care provider.   Document Released: 04/25/2005 Document Revised: 09/09/2014 Document Reviewed: 04/16/2014 Elsevier Interactive Patient  Education 2016 [place] Contusion  An eye contusion is a deep bruise of the eye. It is often called a "black eye." Contusions happen when an injury causes bleeding under the skin. Signs of bruising include pain, puffiness (swelling), and discolored skin. The contusion may turn blue, purple, or yellow. A black eye can be very serious and can affect the eyeball and sight. HOME CARE  Put ice on the injured area.  Put ice in a plastic bag.  Place a  towel between your skin and the bag.  Leave the ice on for 15-20 minutes, 03-04 times a day.  If there is no injury to the eye, you may keep doing normal activities.  Wear sunglasses if bright lights bother you.  Sleep with your head raised (elevated) to help with discomfort.  Only take medicines as told by your doctor. GET HELP RIGHT AWAY IF:  You notice any vision loss.  You see two of everything (double vision).  You feel sick to your stomach (nauseous).  You feel dizzy, sleepy, or like you will pass out (faint).  You have any fluid coming from your eye or nose.  You have puffiness and bruising that does not fade. MAKE SURE YOU:   Understand these instructions.  Will watch your condition.  Will get help right away if you are not doing well or get worse.   This information is not intended to replace advice given to you by your health care provider. Make sure you discuss any questions you have with your health care provider.   Document Released: 04/14/2011 Document Revised: 07/18/2011 Document Reviewed: 12/30/2014 Elsevier Interactive Patient Education [employer].

## 2015-11-30 DIAGNOSIS — M791 Myalgia: Secondary | ICD-10-CM | POA: Diagnosis not present

## 2015-11-30 DIAGNOSIS — S060X1A Concussion with loss of consciousness of 30 minutes or less, initial encounter: Secondary | ICD-10-CM | POA: Diagnosis not present

## 2015-11-30 DIAGNOSIS — R410 Disorientation, unspecified: Secondary | ICD-10-CM | POA: Diagnosis not present

## 2015-11-30 DIAGNOSIS — T148 Other injury of unspecified body region: Secondary | ICD-10-CM | POA: Diagnosis not present

## 2015-12-01 ENCOUNTER — Other Ambulatory Visit: Payer: Self-pay | Admitting: Internal Medicine

## 2015-12-01 DIAGNOSIS — S060X0A Concussion without loss of consciousness, initial encounter: Secondary | ICD-10-CM

## 2015-12-01 DIAGNOSIS — R410 Disorientation, unspecified: Secondary | ICD-10-CM

## 2015-12-02 ENCOUNTER — Other Ambulatory Visit: Payer: Commercial Managed Care - HMO

## 2015-12-02 ENCOUNTER — Ambulatory Visit
Admission: RE | Admit: 2015-12-02 | Discharge: 2015-12-02 | Disposition: A | Discharge status: Home / Self Care | Payer: Commercial Managed Care - HMO | Source: Ambulatory Visit | Attending: Internal Medicine | Admitting: Internal Medicine

## 2015-12-02 DIAGNOSIS — S060X0A Concussion without loss of consciousness, initial encounter: Secondary | ICD-10-CM

## 2015-12-02 DIAGNOSIS — S098XXA Other specified injuries of head, initial encounter: Secondary | ICD-10-CM | POA: Diagnosis not present

## 2015-12-02 DIAGNOSIS — R410 Disorientation, unspecified: Secondary | ICD-10-CM

## 2015-12-03 DIAGNOSIS — R9402 Abnormal brain scan: Secondary | ICD-10-CM | POA: Diagnosis not present

## 2015-12-03 DIAGNOSIS — G44309 Post-traumatic headache, unspecified, not intractable: Secondary | ICD-10-CM | POA: Diagnosis not present

## 2015-12-04 ENCOUNTER — Ambulatory Visit (INDEPENDENT_AMBULATORY_CARE_PROVIDER_SITE_OTHER): Payer: Commercial Managed Care - HMO | Admitting: Diagnostic Neuroimaging

## 2015-12-04 ENCOUNTER — Encounter: Payer: Self-pay | Admitting: Diagnostic Neuroimaging

## 2015-12-04 VITALS — BP 153/81 | HR 68 | Ht 63.0 in | Wt 172.0 lb

## 2015-12-04 DIAGNOSIS — M6289 Other specified disorders of muscle: Secondary | ICD-10-CM | POA: Diagnosis not present

## 2015-12-04 DIAGNOSIS — I615 Nontraumatic intracerebral hemorrhage, intraventricular: Secondary | ICD-10-CM | POA: Diagnosis not present

## 2015-12-04 DIAGNOSIS — R531 Weakness: Secondary | ICD-10-CM

## 2015-12-04 DIAGNOSIS — S06341A Traumatic hemorrhage of right cerebrum with loss of consciousness of 30 minutes or less, initial encounter: Secondary | ICD-10-CM | POA: Diagnosis not present

## 2015-12-04 MED ORDER — AMLODIPINE BESYLATE 10 MG PO TABS: 10.0000 mg | ORAL_TABLET | Freq: Every day | ORAL | 6 refills | Status: DC

## 2015-12-04 NOTE — Progress Notes (Signed)
[facility]  PATIENT: [name] DOB: Nov 17, 1945  REFERRING CLINICIAN: A Ramachandran HISTORY FROM: patient  REASON FOR VISIT: new consult    HISTORICAL  CHIEF COMPLAINT:  Chief Complaint  Patient presents with  . Other    rm 6, New Pt, recent fall, abnl CT, constant headache, "fell on wet floor 11/28/15, fell onto left side head/face, hydrocodone slightly helpful, using ice packs     HISTORY OF PRESENT ILLNESS:   70 year old right-handed female here for evaluation of trip and fall, trauma, concussion, intracerebral hemorrhage.  11/28/15 patient was at the grocery store when she slipped on a loose water drain grate with her right foot, got twisted up, and then fell towards her left side. She fell forward striking the floor with her left forehead. She blacked out for a few seconds and then woke up. When she woke up she was dizzy, stunned, dazed, confused. Bystanders called 911 and patient was taken to local urgent care. She was treated for left lip laceration. She had some pain over her left eyebrow.  Patient then went to PCP the next day for evaluation due to persistent headache. 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. Patient referred to me for further valuation.  Since that time patient continues to have near continuous headaches in the frontal region, retro-orbital, with pressure sensation. Headaches are slightly more in the left and right side. No nausea or vomiting. No photophobia or phonophobia. Patient denies any focal numbness, weakness, slurred speech, memory loss. Patient has been having some trouble sleeping.  Patient denies any onset of left-sided weakness prior to falling down. Patient has a history of hypertension and is on medication. When she went to urgent care apparently her systolic blood pressure was in the 190s. Today's systolic blood pressure  [id number].    REVIEW OF SYSTEMS: Full 14 system review of systems performed and negative with exception of: Headache dizziness sleepiness spinning sensation.   ALLERGIES: Allergies  Allergen Reactions  . Ace Inhibitors Anaphylaxis    REACTION: angioedema  . Other Anaphylaxis    Calcium Channel Blocking Agent Diltiazem Analogues  . Pneumococcal Vaccines     Red rash  Knot (sore and itches) A fever for a couple days (per pt report)  . Tetanus Toxoid     Cellulitis in arm    HOME MEDICATIONS: Outpatient Medications Prior to Visit  Medication Sig Dispense Refill  . amLODipine (NORVASC) 5 MG tablet Take 1 tablet (5 mg total) by mouth daily. 90 tablet 3  . aspirin EC 81 MG tablet Take 81 mg by mouth daily.    [name] atorvastatin (LIPITOR) 10 MG tablet Take 1 tablet (10 mg total) by mouth daily. 90 tablet 3  . HYDROcodone-acetaminophen (NORCO/VICODIN) 5-325 MG tablet Take 2 tablets by mouth every 4 (four) hours as needed for moderate pain or severe pain. 10 tablet 0  . pantoprazole (PROTONIX) 40 MG tablet Take 1 tablet (40 mg total) by mouth daily. 90 tablet 3  . venlafaxine XR (EFFEXOR-XR) 75 MG 24 hr capsule Take 1 capsule by mouth daily.  0  . buPROPion (WELLBUTRIN SR) 100 MG 12 hr tablet Take 1 tablet (100 mg total) by mouth 2 (two) times daily. 180 tablet 3  . cephALEXin (KEFLEX) 500 MG capsule Take 1 capsule (500 mg total) by mouth 4 (four) times daily. 40 capsule 0   No facility-administered medications prior to visit.  PAST MEDICAL HISTORY: Past Medical History:  Diagnosis Date  . Allergic rhinitis   . Anxiety   . COPD (chronic obstructive pulmonary disease) ([place]) 05/23/2012  . Diverticulosis   . DJD (degenerative joint disease)   . Esophageal diverticulum   . Esophageal stricture   . GERD (gastroesophageal reflux disease)   . Hiatal hernia   . Hyperlipidemia 05/23/2012   T. Chol 234, LDL 130   . Hypertension   . IBS (irritable bowel syndrome)   . Osteopenia   .  Traumatic arthritis     PAST SURGICAL HISTORY: Past Surgical History:  Procedure Laterality Date  . CERVICAL SPINE SURGERY  2005  . LUNG BIOPSY Left    left side, not cancerous, thoracotomy  . VAGINAL HYSTERECTOMY      FAMILY HISTORY: Family History  Problem Relation Age of Onset  . Throat cancer Father   . Hypertension Sister   . Thyroid disease Daughter   . Hypertension Sister     x 2  . Breast cancer Maternal Aunt   . Blindness Sister     legally blind  . Colon cancer Neg Hx   . Stomach cancer Neg Hx     SOCIAL HISTORY:  Social History   Social History  . Marital status: Single    Spouse name: N/A  . Number of children: 2  . Years of education: 12   Occupational History  . Retired Chartered certified accountant    Social History Main Topics  . Smoking status: Former Smoker    Types: Cigarettes    Quit date: 05/04/1985  . Smokeless tobacco: Never Used  . Alcohol use 0.0 oz/week     Comment: occasional  . Drug use: No  . Sexual activity: Not on file   Other Topics Concern  . Not on file   Social History Narrative   Lives alone   Caffeine -tea, 1 cup daily;  Pepsi maybe one a day     PHYSICAL EXAM  GENERAL EXAM/CONSTITUTIONAL: Vitals:  Vitals:   12/04/15 0848  BP: (!) 153/81  Pulse: 68  Weight: 172 lb (78 kg)  Height: 5\' 3"  (1.6 m)     Body mass index is 30.47 kg/m.  Vision Screening Comments: 12/04/15 unable to complete  Patient is in no distress; well developed, nourished and groomed; neck is supple  CARDIOVASCULAR:  Examination of carotid arteries is normal; no carotid bruits  Regular rate and rhythm, no murmurs  Examination of peripheral vascular system by observation and palpation is normal  EYES:  Ophthalmoscopic exam of optic discs and posterior segments is normal; no papilledema or hemorrhages  MUSCULOSKELETAL:  Gait, strength, tone, movements noted in Neurologic exam below  NEUROLOGIC: MENTAL STATUS:  No flowsheet data  found.  awake, alert, oriented to person, place and time  recent and remote memory intact  normal attention and concentration  language fluent, comprehension intact, naming intact,   fund of knowledge appropriate  CRANIAL NERVE:   2nd - no papilledema on fundoscopic exam  2nd, 3rd, 4th, 6th - pupils equal and reactive to light, visual fields full to confrontation, extraocular muscles intact, no nystagmus  5th - facial sensation symmetric  7th - facial strength symmetric  8th - hearing intact  9th - palate elevates symmetrically, uvula midline  11th - shoulder shrug symmetric  12th - tongue protrusion midline  MOTOR:   normal bulk and tone, full strength in the BUE, BLE; EXCEPT LEFT TRICEPS AND LEFT HIP FLEXION 4/5  SENSORY:  normal and symmetric to light touch, temperature, vibration; EXCEPT DECR LT IN LEFT ARM AND DECR TEMP IN LEFT FOOT  COORDINATION:   finger-nose-finger, fine finger movements normal; EXCEPT SLIGHTLY SLOWER IN LEFT HAND  REFLEXES:   deep tendon reflexes BRISK and symmetric  GAIT/STATION:   narrow based gait; romberg is negative    DIAGNOSTIC DATA (LABS, IMAGING, TESTING) - I reviewed patient records, labs, notes, testing and imaging myself where available.  Lab Results  Component Value Date   WBC 4.8 09/10/2015   HGB 12.3 09/10/2015   HCT 37.5 09/10/2015   MCV 92.7 09/10/2015   PLT 247.0 09/10/2015      Component Value Date/Time   NA 142 09/10/2015 1143   K 3.9 09/10/2015 1143   CL 105 09/10/2015 1143   CO2 30 09/10/2015 1143   GLUCOSE 87 09/10/2015 1143   BUN 13 09/10/2015 1143   CREATININE 0.93 09/10/2015 1143   CALCIUM 9.3 09/10/2015 1143   PROT 7.4 09/10/2015 1143   ALBUMIN 4.1 09/10/2015 1143   AST 21 09/10/2015 1143   ALT 17 09/10/2015 1143   ALKPHOS 131 (H) 09/10/2015 1143   BILITOT 0.5 09/10/2015 1143   GFRNONAA 64 (L) 06/19/2012 0440   GFRAA 74 (L) 06/19/2012 0440   Lab Results  Component Value Date    CHOL 174 09/10/2015   HDL 66.60 09/10/2015   LDLCALC 86 09/10/2015   LDLDIRECT 143.2 05/28/2013   TRIG 103.0 09/10/2015   CHOLHDL 3 09/10/2015   Lab Results  Component Value Date   HGBA1C 5.8 02/02/2010   Lab Results  Component Value Date   VITAMINB12 358 06/29/2012   Lab Results  Component Value Date   TSH 1.37 09/10/2015    7/26.17 CT head [I reviewed images myself and agree with interpretation. Subtle left tentorial subdural ICH noted as well. -VRP]  - Subacute intraparenchymal hemorrhage in the right basal ganglia/internal capsule. Two adjacent hematomas, the larger measuring 10 mm in the smaller measuring 8 mm. Mild surrounding edema.     ASSESSMENT AND PLAN  70 y.o. year old female here with History of hypertension, status post fall, head trauma, now with posttraumatic headaches, right basal ganglia intercerebral hemorrhage, and mild left hand and leg weakness. The question remains whether this represents a traumatic intracerebral hemorrhage versus primary hypertensive intracerebral hemorrhage leading to patient falling down. The imaging characteristics would be more consistent with primary hypertensive intracerebral hemorrhage. However by history patient reports that the trip and fall were unprovoked by her own weakness and rather by a loose water drain grate.   Ddx: intracerebral hemorrhage (due to trauma vs HTN vs infarct vs mass)  1. Traumatic hemorrhage of right cerebrum with loss of consciousness of 30 minutes or less, initial encounter ([place])   2. Non-traumatic intracerebral ventricular hemorrhage (H[facility])   3. Left-sided weakness      PLAN: - check MRI brain to rule out secondary causes of intracerebral hemorrhage - increase amlodipine to 10mg  daily; monitor BP at home - gradually increase activities - offered medication for headache mgmt (such as gabapentin) but pt declined for now - offered PT evaluation for left sided weakness but pt declined for now  Orders  Placed This Encounter  Procedures  . MR Brain W Wo Contrast   Meds ordered this encounter  Medications  . amLODipine (NORVASC) 10 MG tablet    Sig: Take 1 tablet (10 mg total) by mouth daily.    Dispense:  30 tablet    Refill:  6  Return in about 6 weeks (around 01/15/2016).  I reviewed images, labs, notes, records myself. I summarized findings and reviewed with patient, for this high risk condition (intracerebral hemorrhage) requiring high complexity decision making.    Penni Bombard, MD [id number], [id number] AM Certified in Neurology, Neurophysiology and Neuroimaging  [facility] [address] [place], [place] [address] [phone]

## 2015-12-04 NOTE — Patient Instructions (Addendum)
Thank you for coming to see [place] at [facility]. I hope we have been able to provide you high quality care today.  You may receive a patient satisfaction survey over the next few weeks. We would appreciate your feedback and comments so that we may continue to improve ourselves and the health of our patients.  - check MRI brain - increase amlodipine to 25m daily; monitor BP at home - gradually increase activities   ~~~~~~~~~~~~~~~~~~~~~~~~~~~~~~~~~~~~~~~~~~~~~~~~~~~~~~~~~~~~~~~~~  DR. Lyden Redner'S GUIDE TO HAPPY AND HEALTHY LIVING These are some of my general health and wellness recommendations. Some of them may apply to you better than others. Please use common sense as you try these suggestions and feel free to ask me any questions.   ACTIVITY/FITNESS Mental, social, emotional and physical stimulation are very important for brain and body health. Try learning a new activity (arts, music, language, sports, games).  Keep moving your body to the best of your abilities. You can do this at home, inside or outside, the park, community center, gym or anywhere you like. Consider a physical therapist or personal trainer to get started. Consider the app Sworkit. Fitness trackers such as smart-watches, smart-phones or Fitbits can help as well.   NUTRITION Eat more plants: colorful vegetables, nuts, seeds and berries.  Eat less sugar, salt, preservatives and processed foods.  Avoid toxins such as cigarettes and alcohol.  Drink water when you are thirsty. Warm water with a slice of lemon is an excellent morning drink to start the day.  Consider these websites for more information The Nutrition [employer] (hhttps://www.henry-hernandez.biz/ [employer] (wWindowBlog.ch   RELAXATION Consider practicing mindfulness meditation or other relaxation techniques such as deep breathing, prayer, yoga, tai chi, massage. See website mindful.org  or the apps Headspace or Calm to help get started.   SLEEP Try to get at least 7-8+ hours sleep per day. Regular exercise and reduced caffeine will help you sleep better. Practice good sleep hygeine techniques. See website sleep.org for more information.   PLANNING Prepare estate planning, living will, healthcare POA documents. Sometimes this is best planned with the help of an attorney. Theconversationproject.org and agingwithdignity.org are excellent resources.

## 2015-12-07 ENCOUNTER — Telehealth: Payer: Self-pay | Admitting: Diagnostic Neuroimaging

## 2015-12-07 NOTE — Telephone Encounter (Signed)
Patient is calling about the scheduling of her MRI.  Please call.

## 2015-12-09 DIAGNOSIS — T148 Other injury of unspecified body region: Secondary | ICD-10-CM | POA: Diagnosis not present

## 2015-12-16 ENCOUNTER — Ambulatory Visit
Admission: RE | Admit: 2015-12-16 | Discharge: 2015-12-16 | Disposition: A | Discharge status: Home / Self Care | Payer: Commercial Managed Care - HMO | Source: Ambulatory Visit | Attending: Diagnostic Neuroimaging | Admitting: Diagnostic Neuroimaging

## 2015-12-16 DIAGNOSIS — T148 Other injury of unspecified body region: Secondary | ICD-10-CM | POA: Diagnosis not present

## 2015-12-16 DIAGNOSIS — G44309 Post-traumatic headache, unspecified, not intractable: Secondary | ICD-10-CM | POA: Diagnosis not present

## 2015-12-16 DIAGNOSIS — M25552 Pain in left hip: Secondary | ICD-10-CM | POA: Diagnosis not present

## 2015-12-16 DIAGNOSIS — S06341A Traumatic hemorrhage of right cerebrum with loss of consciousness of 30 minutes or less, initial encounter: Secondary | ICD-10-CM | POA: Diagnosis not present

## 2015-12-16 MED ORDER — GADOBENATE DIMEGLUMINE 529 MG/ML IV SOLN
16.0000 mL | Freq: Once | INTRAVENOUS | Status: AC | PRN
  Administered 2015-12-16: 16 mL via INTRAVENOUS

## 2015-12-16 NOTE — Telephone Encounter (Signed)
Patient is scheduled at [facility].

## 2015-12-17 ENCOUNTER — Telehealth: Payer: Self-pay | Admitting: Diagnostic Neuroimaging

## 2015-12-17 DIAGNOSIS — R531 Weakness: Secondary | ICD-10-CM

## 2015-12-17 DIAGNOSIS — S06349A Traumatic hemorrhage of right cerebrum with loss of consciousness of unspecified duration, initial encounter: Secondary | ICD-10-CM

## 2015-12-17 NOTE — Telephone Encounter (Signed)
Per Dr Leta Baptist, LVM informing patient her MRI results are stable, no new findings. Dr Leta Baptist will continue with her current treatment plan. Advised she monitor her BP, increase her activity and let us know if she decides she wants PT referral. Reminded her of her FU in Sept. Left name, number.

## 2015-12-17 NOTE — Telephone Encounter (Signed)
Pt called request MRI results

## 2015-12-17 NOTE — Telephone Encounter (Signed)
See result note.  

## 2015-12-21 NOTE — Addendum Note (Signed)
Addended by: Minna Antis on: 12/21/2015 04:08 PM   Modules accepted: Orders

## 2015-12-21 NOTE — Telephone Encounter (Signed)
PT referral placed to evaluate and treat for left sided weakness.

## 2015-12-21 NOTE — Telephone Encounter (Signed)
Pt does want a PT referral.

## 2016-01-06 ENCOUNTER — Ambulatory Visit
Admission: RE | Admit: 2016-01-06 | Discharge: 2016-01-06 | Disposition: A | Discharge status: Home / Self Care | Payer: Commercial Managed Care - HMO | Source: Ambulatory Visit | Attending: Internal Medicine | Admitting: Internal Medicine

## 2016-01-06 ENCOUNTER — Other Ambulatory Visit: Payer: Self-pay | Admitting: Internal Medicine

## 2016-01-06 DIAGNOSIS — Z723 Lack of physical exercise: Secondary | ICD-10-CM | POA: Diagnosis not present

## 2016-01-06 DIAGNOSIS — M79661 Pain in right lower leg: Secondary | ICD-10-CM | POA: Diagnosis not present

## 2016-01-06 DIAGNOSIS — R609 Edema, unspecified: Secondary | ICD-10-CM

## 2016-01-12 DIAGNOSIS — M7989 Other specified soft tissue disorders: Secondary | ICD-10-CM | POA: Diagnosis not present

## 2016-01-12 DIAGNOSIS — M79661 Pain in right lower leg: Secondary | ICD-10-CM | POA: Diagnosis not present

## 2016-01-13 DIAGNOSIS — M25471 Effusion, right ankle: Secondary | ICD-10-CM | POA: Diagnosis not present

## 2016-01-19 ENCOUNTER — Ambulatory Visit: Payer: Commercial Managed Care - HMO | Admitting: Occupational Therapy

## 2016-01-19 ENCOUNTER — Ambulatory Visit: Payer: Commercial Managed Care - HMO | Attending: Orthopedic Surgery

## 2016-01-19 DIAGNOSIS — R41840 Attention and concentration deficit: Secondary | ICD-10-CM | POA: Diagnosis not present

## 2016-01-19 DIAGNOSIS — R41844 Frontal lobe and executive function deficit: Secondary | ICD-10-CM | POA: Diagnosis not present

## 2016-01-19 DIAGNOSIS — G8194 Hemiplegia, unspecified affecting left nondominant side: Secondary | ICD-10-CM | POA: Insufficient documentation

## 2016-01-19 DIAGNOSIS — R208 Other disturbances of skin sensation: Secondary | ICD-10-CM

## 2016-01-19 DIAGNOSIS — M6281 Muscle weakness (generalized): Secondary | ICD-10-CM

## 2016-01-19 DIAGNOSIS — R2689 Other abnormalities of gait and mobility: Secondary | ICD-10-CM

## 2016-01-19 NOTE — Therapy (Signed)
[facility] [address] [place], [place], [address] Phone: [phone]   Fax:  [phone]  Physical Therapy Evaluation  Patient Details  Name: [name] MRN: [id number] Date of Birth: 1945/05/13 Referring Provider: Dr. Leta Baptist  Encounter Date: 01/19/2016      PT End of Session - 01/19/16 0916    Visit Number 1   Number of Visits 9   Date for PT Re-Evaluation 02/18/16   Authorization Type [employer] Medicare-no Josem Kaufmann req per Lattie Haw. G-CODE AND PROGRESS NOTE EVERY 10TH VISIT.    PT Start Time [id number]   PT Stop Time 0913   PT Time Calculation (min) 26 min   Equipment Utilized During Treatment --  min guard to S prn   Activity Tolerance Patient tolerated treatment well   Behavior During Therapy [facility] for tasks assessed/performed      Past Medical History:  Diagnosis Date  . Allergic rhinitis   . Anxiety   . COPD (chronic obstructive pulmonary disease) ([facility]) 05/23/2012  . Diverticulosis   . DJD (degenerative joint disease)   . Esophageal diverticulum   . Esophageal stricture   . GERD (gastroesophageal reflux disease)   . Hiatal hernia   . Hyperlipidemia 05/23/2012   T. Chol 234, LDL 130   . Hypertension   . IBS (irritable bowel syndrome)   . Osteopenia   . Traumatic arthritis     Past Surgical History:  Procedure Laterality Date  . CERVICAL SPINE SURGERY  2005  . LUNG BIOPSY Left    left side, not cancerous, thoracotomy  . VAGINAL HYSTERECTOMY      There were no vitals filed for this visit.       Subjective Assessment - 01/19/16 0849    Subjective Pt presenting with L-sided weakness. Pt reported she blacked out and fell in 11/28/2015, pt went to ED and PCP and was told she had 2 ICHs. Pt reported she now has L sided weakness. Pt reports neurologist is following pt for intermittent HAs s/p fall.   Pertinent History HTN, hyperlipidemia, osteopenia, anxiety, depression, COPD, IBS, traumatic arthritis    Patient Stated Goals Strengthen the L side    Currently in Pain? No/denies            [facility] PT Assessment - 01/19/16 0852      Assessment   Medical Diagnosis Traumatic hemorrhage of right cerebrum with loss of consciousness of 30 minutes or less, initial encounter    Referring Provider Dr. Leta Baptist   Onset Date/Surgical Date 11/28/15   Hand Dominance Right   Prior Therapy none for this issue     Precautions   Precautions Fall     Restrictions   Weight Bearing Restrictions No     Balance Screen   Has the patient fallen in the past 6 months Yes   How many times? 1   Has the patient had a decrease in activity level because of a fear of falling?  Yes   Is the patient reluctant to leave their home because of a fear of falling?  No     Home Social worker Private residence   Living Arrangements Alone   Available Help at Discharge Family;Friend(s)   Type of Baker to enter   Entrance Stairs-Number of Steps 2   Entrance Stairs-Rails None   Home Layout One level   Home Equipment Grab bars - tub/shower     Prior Function   Level  of Independence Independent   Vocation Retired   Leisure walking, cooking, reading     Cognition   Overall Cognitive Status Impaired/Different from baseline   Memory Impaired   Memory Impairment Decreased short term memory  per pt     Sensation   Light Touch Appears Intact   Additional Comments Pt reported intermittent L hand N/T      Coordination   Gross Motor Movements are Fluid and Coordinated Yes   Fine Motor Movements are Fluid and Coordinated No  decr. speed during fingers to thumbs (B hands)   Heel Shin Test WNL     ROM / Strength   AROM / PROM / Strength AROM;Strength     AROM   Overall AROM  Within functional limits for tasks performed   Overall AROM Comments BLE AROM WNLs     Strength   Overall Strength Deficits   Overall Strength Comments RLE grossly 4+/5, and LLE: hip flex:  3+/5, knee ext: 4/5, knee flex: 3+/5, ankle DF: 4/5. Gross hip abd/add in sitting: 4/5.     Transfers   Transfers Sit to Stand;Stand to Sit   Sit to Stand 7: Independent;Without upper extremity assist;From chair/3-in-1   Stand to Sit 7: Independent;Without upper extremity assist;To chair/3-in-1     Ambulation/Gait   Ambulation/Gait Yes   Ambulation/Gait Assistance 5: Supervision   Ambulation/Gait Assistance Details Pt experienced L foot drop after amb. longer distances.    Ambulation Distance (Feet) 250 Feet   Assistive device None   Gait Pattern Step-through pattern;Decreased stride length;Decreased stance time - left   Ambulation Surface Level;Indoor   Gait velocity 3.44ft/sec. no AD     Functional Gait  Assessment   Gait assessed  Yes   Gait Level Surface Walks 20 ft in less than 7 sec but greater than 5.5 sec, uses assistive device, slower speed, mild gait deviations, or deviates 6-10 in outside of the 12 in walkway width.   Change in Gait Speed Able to change speed, demonstrates mild gait deviations, deviates 6-10 in outside of the 12 in walkway width, or no gait deviations, unable to achieve a major change in velocity, or uses a change in velocity, or uses an assistive device.   Gait with Horizontal Head Turns Performs head turns smoothly with slight change in gait velocity (eg, minor disruption to smooth gait path), deviates 6-10 in outside 12 in walkway width, or uses an assistive device.   Gait with Vertical Head Turns Performs task with slight change in gait velocity (eg, minor disruption to smooth gait path), deviates 6 - 10 in outside 12 in walkway width or uses assistive device   Gait and Pivot Turn Pivot turns safely within 3 sec and stops quickly with no loss of balance.   Step Over Obstacle Is able to step over 2 stacked shoe boxes taped together (9 in total height) without changing gait speed. No evidence of imbalance.   Gait with Narrow Base of Support Ambulates 4-7 steps.   6 steps   Gait with Eyes Closed Walks 20 ft, slow speed, abnormal gait pattern, evidence for imbalance, deviates 10-15 in outside 12 in walkway width. Requires more than 9 sec to ambulate 20 ft.   Ambulating Backwards Walks 20 ft, uses assistive device, slower speed, mild gait deviations, deviates 6-10 in outside 12 in walkway width.   Steps Two feet to a stair, must use rail.   Total Score 19  PT Education - 01/19/16 0916    Education provided Yes   Education Details PT discussed frequency/duration and outcome measure results.   Person(s) Educated Patient   Methods Explanation   Comprehension Verbalized understanding          PT Short Term Goals - 01/19/16 0922      PT SHORT TERM GOAL #1   Title same as LTGs           PT Long Term Goals - 01/19/16 2440      PT LONG TERM GOAL #1   Title Pt will be IND in HEP to improve strength and balance. TARGET DATE FOR ALL LTGS: 02/16/16   Status New     PT LONG TERM GOAL #2   Title Pt will improve FGA score to >/=27/30 to decr. falls risk.    Status New     PT LONG TERM GOAL #3   Title Pt will ambulate 1000', IND, over even/uneven terrain to improve functional mobility.    Status New     PT LONG TERM GOAL #4   Title Have pt complete FOTO and write goal.    Status New               Plan - 01/19/16 1027    Clinical Impression Statement Pt is a pleasant 70y/o female presenting to OPPT neuro s/p fall and 2 ICHs. PMH significant for the following: HTN, hyperlipidemia, osteopenia, anxiety, depression, COPD, IBS, traumatic arthritis. The following deficits were noted during pt exam: decreased LLE strength, decreased coordination, gait deviations, impaired balance, and decreased endurance. Pt reported intermittent HAs, PT will monitor but not directly address. Pt's FGA score indicates pt is at medium risk for falls.    Rehab Potential Good   Clinical Impairments Affecting Rehab  Potential co-morbidities   PT Frequency 2x / week   PT Duration 4 weeks   PT Treatment/Interventions ADLs/Self Care Home Management;Biofeedback;Patient/family education;Orthotic Fit/Training;DME Instruction;Gait training;Stair training;Canalith Repostioning;Neuromuscular re-education;Balance training;Therapeutic exercise;Therapeutic activities;Functional mobility training;Manual techniques;Vestibular   PT Next Visit Plan Initiate balance and strenghthening HEP   Consulted and Agree with Plan of Care Patient      Patient will benefit from skilled therapeutic intervention in order to improve the following deficits and impairments:  Abnormal gait, Decreased endurance, Decreased knowledge of use of DME, Decreased strength, Decreased coordination, Decreased balance, Decreased mobility  Visit Diagnosis: Hemiplegia, unspecified affecting left nondominant side ([place]) - Plan: PT plan of care cert/re-cert  Other abnormalities of gait and mobility - Plan: PT plan of care cert/re-cert      G-Codes - 25/36/64 0924    Functional Assessment Tool Used FGA: 19/30   Functional Limitation Mobility: Walking and moving around   Mobility: Walking and Moving Around Current Status [phone]) At least 40 percent but less than 60 percent impaired, limited or restricted   Mobility: Walking and Moving Around Goal Status [phone]) At least 1 percent but less than 20 percent impaired, limited or restricted       Problem List Patient Active Problem List   Diagnosis Date Noted  . Urinary frequency 09/10/2015  . Dysphagia 02/24/2015  . Traumatic arthritis 02/24/2015  . Fever 02/24/2015  . Low back pain 11/11/2014  . Lower extremity edema 10/07/2014  . Chest pain 02/25/2014  . Chest pain at rest 06/18/2012  . Hypokalemia 06/18/2012  . Anemia 06/18/2012  . Leukopenia 06/18/2012  . COPD (chronic obstructive pulmonary disease) ([place]) 05/23/2012  . Osteopenia 05/23/2012  . Encounter for preventative adult health  care  exam with abnormal findings 05/17/2011  . THORACIC/LUMBOSACRAL NEURITIS/RADICULITIS UNSPEC 11/30/2009  . BACK PAIN 11/30/2009  . BACK PAIN WITH RADICULOPATHY 11/30/2009  . DEPRESSION/ANXIETY 06/01/2009  . MYOCARDIAL PERFUSION SCAN, WITH STRESS TEST, ABNORMAL 03/26/2009  . ALLERGIC REACTION, ACUTE 09/05/2008  . PULMONARY NODULE 08/29/2008  . Early satiety 08/25/2008  . WEIGHT LOSS 08/21/2008  . GROIN PAIN 08/21/2008  . Nonspecific (abnormal) findings on radiological and other examination of body structure 08/21/2008  . Hyperlipidemia 12/25/2007  . ANXIETY 12/25/2007  . Essential hypertension 12/25/2007  . ALLERGIC RHINITIS 12/25/2007  . GERD 12/25/2007  . DIVERTICULOSIS, COLON 12/25/2007  . IBS 12/25/2007  . OSTEOPENIA 12/25/2007    Smt. Loder L 01/19/2016, 9:25 AM  [facility] [address] [place], [place], [address] Phone: [phone]   Fax:  [phone]  Name: [name] MRN: [id number] Date of Birth: 06-07-1945  Geoffry Paradise, PT,DPT 01/19/16 9:25 AM Phone: [phone] Fax: [phone]
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
## 2016-01-20 ENCOUNTER — Ambulatory Visit: Payer: Commercial Managed Care - HMO | Admitting: Diagnostic Neuroimaging

## 2016-01-22 DIAGNOSIS — M25471 Effusion, right ankle: Secondary | ICD-10-CM | POA: Diagnosis not present

## 2016-01-25 ENCOUNTER — Encounter: Payer: Self-pay | Admitting: Physical Therapy

## 2016-01-25 ENCOUNTER — Ambulatory Visit: Payer: Commercial Managed Care - HMO | Admitting: Physical Therapy

## 2016-01-25 DIAGNOSIS — R41840 Attention and concentration deficit: Secondary | ICD-10-CM | POA: Diagnosis not present

## 2016-01-25 DIAGNOSIS — R2689 Other abnormalities of gait and mobility: Secondary | ICD-10-CM | POA: Diagnosis not present

## 2016-01-25 DIAGNOSIS — G8194 Hemiplegia, unspecified affecting left nondominant side: Secondary | ICD-10-CM

## 2016-01-25 DIAGNOSIS — R41844 Frontal lobe and executive function deficit: Secondary | ICD-10-CM | POA: Diagnosis not present

## 2016-01-25 DIAGNOSIS — M6281 Muscle weakness (generalized): Secondary | ICD-10-CM | POA: Diagnosis not present

## 2016-01-25 DIAGNOSIS — R208 Other disturbances of skin sensation: Secondary | ICD-10-CM | POA: Diagnosis not present

## 2016-01-25 NOTE — Patient Instructions (Addendum)
Along a carpeted hallway perform the following:  Carpeted Surface With Side to Side Head Motion    Perform without assistive device. Walking on carpet, turn head and eyes left for _3___ steps.  Then, turn head and eyes to opposite side for __3__ steps. Repeat sequence __4__ times/laps per session. Do __1-2__ sessions per day.  Copyright  VHI. All rights reserved.  Carpeted Surface With Up / Down Head Motion    Perform without assistive device. Walking on carpet, move head and eyes toward ceiling for __3__ steps.  Then, move head and eyes toward floor for __3__ steps. Repeat sequence __4__ times/laps per session. Do __1-2__ sessions per day.   Copyright  VHI. All rights reserved.  Feet Heel-Toe "Tandem"    Arms as needed for balance/at sides, walk a straight line bringing one foot directly in front of the other. Then walk backwards by placing one foot directly behind the other one.  Repeat for 3 laps each way per session. Do __1-2__ sessions per day.  Copyright  VHI. All rights reserved.   Perform the following in a corner with a chair in front of you: Feet Together (Compliant Surface) Varied Arm Positions - Eyes Closed    Stand on compliant surface: __pillows/cushion__ with feet together and arms as needed for balance. Close eyes and visualize upright position. Hold__30__ seconds. Repeat _3_ times per session. Do __1-2_ sessions per day.  Copyright  VHI. All rights reserved.   Feet Together (Compliant Surface) Head Motion - Eyes Closed    Stand on compliant surface: _pillows/cushion_ with feet together. Close eyes and move head slowly: 1. Up and down 2. Left and right 3. Diagonals both ways Repeat __10__ times each per session. Do _1-2___ sessions per day.  Copyright  VHI. All rights reserved.
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
## 2016-01-27 ENCOUNTER — Ambulatory Visit: Payer: Commercial Managed Care - HMO | Admitting: Occupational Therapy

## 2016-01-27 DIAGNOSIS — R41844 Frontal lobe and executive function deficit: Secondary | ICD-10-CM | POA: Diagnosis not present

## 2016-01-27 DIAGNOSIS — R41840 Attention and concentration deficit: Secondary | ICD-10-CM | POA: Diagnosis not present

## 2016-01-27 DIAGNOSIS — M6281 Muscle weakness (generalized): Secondary | ICD-10-CM | POA: Diagnosis not present

## 2016-01-27 DIAGNOSIS — G8194 Hemiplegia, unspecified affecting left nondominant side: Secondary | ICD-10-CM | POA: Diagnosis not present

## 2016-01-27 DIAGNOSIS — R208 Other disturbances of skin sensation: Secondary | ICD-10-CM | POA: Diagnosis not present

## 2016-01-27 DIAGNOSIS — R2689 Other abnormalities of gait and mobility: Secondary | ICD-10-CM | POA: Diagnosis not present

## 2016-01-27 NOTE — Patient Instructions (Addendum)
1. Grip Strengthening (Resistive Putty)   Squeeze putty using thumb and all fingers. Repeat _20___ times. Do __2__ sessions per day.   2. Roll putty into tube on table and pinch between each finger and thumb x 10 reps each. (can do ring and small finger together)     Copyright  VHI. All rights reserved.      Strengthening: Resisted Flexion   Hold tubing with _left____ arm(s) at side. Pull forward and up. Move shoulder through pain-free range of motion. Repeat __15__ times per set.  Do _1-2_ sessions per day , every other day   Strengthening: Resisted Extension   Hold tubing in _left ____ hand(s), arm forward. Pull arm back, elbow straight. Repeat _15___ times per set. Do _1-2___ sessions per day, every other day.   Resisted Horizontal Abduction: Bilateral   Sit or stand, tubing in both hands, arms out in front. Keeping arms straight, pinch shoulder blades together and stretch arms out. Repeat _15__ times per set. Do _1-2___ sessions per day, every other day.   Elbow Flexion: Resisted   With tubing held in __left____ hand(s) and other end secured under foot, curl arm up as far as possible. Repeat _15___ times per set. Do _1-2___ sessions per day, every other day.    Elbow Extension: Resisted   Sit in chair with resistive band secured at armrest (or hold with other hand) and _____left__ elbow bent. Straighten elbow. Repeat _15___ times per set.  Do _1-2___ sessions per day, every other day.   Copyright  VHI. All rights reserved.                       Memory Compensation Strategies  1. Use "WARM" strategy.  W= write it down  A= associate it  R= repeat it  M= make a mental note  2.   You can keep a Social worker.  Use a 3-ring notebook with sections for the following: calendar, important names and phone numbers,  medications, doctors' names/phone numbers, lists/reminders, and a section to journal what you did  each day.   3.    Use  a calendar to write appointments down.  4.    Write yourself a schedule for the day.  This can be placed on the calendar or in a separate section of the Memory Notebook.  Keeping a  regular schedule can help memory.  5.    Use medication organizer with sections for each day or morning/evening pills.  You may need help loading it  6.    Keep a basket, or pegboard by the door.  Place items that you need to take out with you in the basket or on the pegboard.  You may also want to  include a message board for reminders.  7.    Use sticky notes.  Place sticky notes with reminders in a place where the task is performed.  For example: " turn off the  stove" placed by the stove, "lock the door" placed on the door at eye level, " take your medications" on  the bathroom mirror or by the place where you normally take your medications.  8.    Use alarms/timers.  Use while cooking to remind yourself to check on food or as a reminder to take your medicine, or as a  reminder to make a call, or as a reminder to perform another task, etc.

## 2016-01-27 NOTE — Therapy (Signed)
[facility] [address] [place], [place], [address] Phone: [phone]   Fax:  [phone]  Occupational Therapy Treatment  Patient Details  Name: [name] MRN: [id number] Date of Birth: 1946/05/04 Referring Provider: Dr. Andrey Spearman  Encounter Date: 01/27/2016      OT End of Session - 01/27/16 1004    Visit Number 2   Number of Visits 9   Date for OT Re-Evaluation 02/17/16   Authorization Type [employer]   Authorization Time Period No auth required, no visit limit   Authorization - Visit Number 2   Authorization - Number of Visits 10   OT Start Time [phone]   OT Stop Time 1015   OT Time Calculation (min) 39 min   Activity Tolerance Patient tolerated treatment well   Behavior During Therapy [facility] for tasks assessed/performed      Past Medical History:  Diagnosis Date  . Allergic rhinitis   . Anxiety   . COPD (chronic obstructive pulmonary disease) ([place]) 05/23/2012  . Diverticulosis   . DJD (degenerative joint disease)   . Esophageal diverticulum   . Esophageal stricture   . GERD (gastroesophageal reflux disease)   . Hiatal hernia   . Hyperlipidemia 05/23/2012   T. Chol 234, LDL 130   . Hypertension   . IBS (irritable bowel syndrome)   . Osteopenia   . Traumatic arthritis     Past Surgical History:  Procedure Laterality Date  . CERVICAL SPINE SURGERY  2005  . LUNG BIOPSY Left    left side, not cancerous, thoracotomy  . VAGINAL HYSTERECTOMY      There were no vitals filed for this visit.      Subjective Assessment - 01/27/16 0939    Subjective  Denies pain   Pertinent History neck surgery over 10 years ago   Patient Stated Goals Strengthen Lt side   Currently in Pain? No/denies                       Arm bike x 6 mins level 3 for conditioning, pt maintained 25-30 rpm       OT Education - 01/27/16 0958    Education provided Yes   Education Details red  putty(10-20 reps each performed) and yellow theraband HEP(15 reps each), memory compensations   Person(s) Educated Patient   Methods Explanation;Demonstration;Verbal cues;Tactile cues;Handout   Comprehension Verbalized understanding;Returned demonstration;Verbal cues required             OT Long Term Goals - 01/19/16 0857      OT LONG TERM GOAL #1   Title Independent with HEP for LUE strengthening and grip strength (all goals due 02/17/16)    Time 4   Period Weeks   Status New     OT LONG TERM GOAL #2   Title Grip strength Lt hand to be 42 lbs or greater   Baseline eval = 35 lbs   Time 4   Period Weeks   Status New     OT LONG TERM GOAL #3   Title Independent with hooking buttons and tying shoes   Time 4   Period Weeks   Status New     OT LONG TERM GOAL #4   Title Pt to demo cooking full meal at mod I level safely   Time 4   Period Weeks   Status New     OT LONG TERM GOAL #5   Title Pt to perform organizational/problem  solving tasks in mod distracting environment at 90% or greater accuracy   Time 4   Period Weeks   Status New     Long Term Additional Goals   Additional Long Term Goals Yes     OT LONG TERM GOAL #6   Title Pt to verbalize understanding with and implement memory compensatory strategies   Time 4   Period Weeks   Status New               Plan - 01/27/16 1001    Clinical Impression Statement Pt is progressing towards goals. She demonstrates understanding of putty and theraband HEP. she can benefit from reinforcement.   Rehab Potential Good   OT Frequency 2x / week   OT Duration 4 weeks   OT Treatment/Interventions Self-care/ADL training;DME and/or AE instruction;Patient/family education;Therapeutic exercises;Therapeutic activities;Functional Mobility Training;Cognitive remediation/compensation;Visual/perceptual remediation/compensation;Manual Therapy;Neuromuscular education   Plan reinforce HEP   Consulted and Agree with Plan of Care  Patient      Patient will benefit from skilled therapeutic intervention in order to improve the following deficits and impairments:  Decreased endurance, Decreased safety awareness, Impaired sensation, Decreased activity tolerance, Impaired UE functional use, Decreased cognition, Decreased mobility, Decreased strength, Impaired vision/preception, Decreased balance  Visit Diagnosis: No diagnosis found.    Problem List Patient Active Problem List   Diagnosis Date Noted  . Urinary frequency 09/10/2015  . Dysphagia 02/24/2015  . Traumatic arthritis 02/24/2015  . Fever 02/24/2015  . Low back pain 11/11/2014  . Lower extremity edema 10/07/2014  . Chest pain 02/25/2014  . Chest pain at rest 06/18/2012  . Hypokalemia 06/18/2012  . Anemia 06/18/2012  . Leukopenia 06/18/2012  . COPD (chronic obstructive pulmonary disease) ([place]) 05/23/2012  . Osteopenia 05/23/2012  . Encounter for preventative adult health care exam with abnormal findings 05/17/2011  . THORACIC/LUMBOSACRAL NEURITIS/RADICULITIS UNSPEC 11/30/2009  . BACK PAIN 11/30/2009  . BACK PAIN WITH RADICULOPATHY 11/30/2009  . DEPRESSION/ANXIETY 06/01/2009  . MYOCARDIAL PERFUSION SCAN, WITH STRESS TEST, ABNORMAL 03/26/2009  . ALLERGIC REACTION, ACUTE 09/05/2008  . PULMONARY NODULE 08/29/2008  . Early satiety 08/25/2008  . WEIGHT LOSS 08/21/2008  . GROIN PAIN 08/21/2008  . Nonspecific (abnormal) findings on radiological and other examination of body structure 08/21/2008  . Hyperlipidemia 12/25/2007  . ANXIETY 12/25/2007  . Essential hypertension 12/25/2007  . ALLERGIC RHINITIS 12/25/2007  . GERD 12/25/2007  . DIVERTICULOSIS, COLON 12/25/2007  . IBS 12/25/2007  . OSTEOPENIA 12/25/2007    [name] 01/27/2016, 10:13 AM  [facility] [address] [place], [place], [address] Phone: [phone]   Fax:  [phone]  Name: [name] MRN: [id number] Date of  Birth: 1946/02/12

## 2016-02-01 ENCOUNTER — Ambulatory Visit: Payer: Commercial Managed Care - HMO | Admitting: Physical Therapy

## 2016-02-01 ENCOUNTER — Encounter: Payer: Self-pay | Admitting: *Deleted

## 2016-02-01 ENCOUNTER — Ambulatory Visit: Payer: Commercial Managed Care - HMO | Admitting: *Deleted

## 2016-02-01 ENCOUNTER — Encounter: Payer: Self-pay | Admitting: Physical Therapy

## 2016-02-01 DIAGNOSIS — R41840 Attention and concentration deficit: Secondary | ICD-10-CM

## 2016-02-01 DIAGNOSIS — M6281 Muscle weakness (generalized): Secondary | ICD-10-CM

## 2016-02-01 DIAGNOSIS — R2689 Other abnormalities of gait and mobility: Secondary | ICD-10-CM

## 2016-02-01 DIAGNOSIS — R208 Other disturbances of skin sensation: Secondary | ICD-10-CM | POA: Diagnosis not present

## 2016-02-01 DIAGNOSIS — G8194 Hemiplegia, unspecified affecting left nondominant side: Secondary | ICD-10-CM

## 2016-02-01 DIAGNOSIS — R41844 Frontal lobe and executive function deficit: Secondary | ICD-10-CM | POA: Diagnosis not present

## 2016-02-01 NOTE — Patient Instructions (Addendum)
Lateral Pinch Strengthening (Resistive Putty)    Squeeze between thumb and side of each finger in turn. Repeat __10__ times. Do __1-2__ sessions per day.  MP Flexion (Resistive Putty)    Bending only at large knuckles, press putty down against thumb. Keep fingertips straight. Repeat _10__ times. Do _1-2__ sessions per day.  Copyright  VHI. All rights reserved.      Memory Compensation Strategies  1. Use "WARM" strategy.  W= write it down  A= associate it  R= repeat it  M= make a mental note  2.   You can keep a Social worker.  Use a 3-ring notebook with sections for the following: calendar, important names and phone numbers, medications, doctors' names/phone numbers, lists/reminders, and a section to journal what you did each day.   3.    Use a calendar to write appointments down.  4.    Write yourself a schedule for the day.  This can be placed on the calendar or in a separate section of the Memory Notebook.  Keeping a regular schedule can help memory.  5.    Use medication organizer with sections for each day or morning/evening pills.  You may need help loading it  6.    Keep a basket, or pegboard by the door.  Place items that you need to take out with you in the basket or on the pegboard.  You may also want to  include a message board for reminders.  7.    Use sticky notes.  Place sticky notes with reminders in a place where the task is performed.  For example: " turn off the off stove" placed by the stove, "lock the door" placed on the door at eye level, " take your medications" on the bathroom mirror or by the place where you normally take your medications.  8.    Use alarms/timers.  Use while cooking to remind yourself to check on food or as a reminder to take your medicine, or as a reminder to make a call, or as a reminder to perform another task, etc.

## 2016-02-01 NOTE — Therapy (Signed)
[facility] [address] [place], [place], [address] Phone: [phone]   Fax:  [phone]  Occupational Therapy Treatment  Patient Details  Name: [name] MRN: [id number] Date of Birth: [birth year] Referring Provider: Dr. Andrey Spearman  Encounter Date: 02/01/2016      OT End of Session - 02/01/16 1301    Visit Number 3   Number of Visits 9   Date for OT Re-Evaluation 02/17/16   Authorization Type [employer] MCR   Authorization Time Period No auth required, no visit limit   Authorization - Visit Number 3   Authorization - Number of Visits 10   OT Start Time 1016   OT Stop Time 1058   OT Time Calculation (min) 42 min   Activity Tolerance Patient tolerated treatment well   Behavior During Therapy [facility] for tasks assessed/performed      Past Medical History:  Diagnosis Date  . Allergic rhinitis   . Anxiety   . COPD (chronic obstructive pulmonary disease) ([place]) 05/23/2012  . Diverticulosis   . DJD (degenerative joint disease)   . Esophageal diverticulum   . Esophageal stricture   . GERD (gastroesophageal reflux disease)   . Hiatal hernia   . Hyperlipidemia 05/23/2012   T. Chol 234, LDL 130   . Hypertension   . IBS (irritable bowel syndrome)   . Osteopenia   . Traumatic arthritis     Past Surgical History:  Procedure Laterality Date  . CERVICAL SPINE SURGERY  2005  . LUNG BIOPSY Left    left side, not cancerous, thoracotomy  . VAGINAL HYSTERECTOMY      There were no vitals filed for this visit.      Subjective Assessment - 02/01/16 1015    Subjective  Denies pain, reports some fatigue/"tired" after HEP but not pain.   Pertinent History neck surgery over 10 years ago   Patient Stated Goals Strengthen Lt side   Currently in Pain? No/denies   Multiple Pain Sites No                      OT Treatments/Exercises (OP) - 02/01/16 0001      ADLs   Overall ADLs Memory Compensation  Strategies, handout issued and reviewed with pt as she reports "forgetting things" sometimes. Ie - "I left my keys in the door one night and found them the next morning" Discussed safety and family assist, use of sticky notes and memory book as needed.     Exercises   Exercises Shoulder;Hand     Shoulder Exercises: Seated   Other Seated Exercises Seated for pinch/3 point with graded clothes pins (yellow, red, green, blue and black). Mid and high level reach LUE     Shoulder Exercises: ROM/Strengthening   UBE (Upper Arm Bike) Bilateral UE's x8 min level 3 for shoulder strengthening and endurance   Other ROM/Strengthening Exercises Bicep curl seated in chair LUE x10 reps     Shoulder Exercises: Isometric Strengthening   Flexion Theraband   Theraband Level (Flexion) Level 1 (Yellow)   Flexion Limitations Standing   Extension Theraband   Theraband Level (Extension) Level 1 (Yellow)   ABduction Theraband   Theraband Level (ABduction) Level 1 (Yellow)  Seated horizontal ABD x10 reps     Hand Exercises   Theraputty - Roll Left hand   Theraputty - Grip Red putty x10 reps Left hand   Theraputty - Pinch 3 point and lateral pinch with left hand x10 reps  each     Functional Reaching Activities   Low Level See above seated exercises w/ LUE   Mid Level See above seated exercises w/ LUE             Balance Exercises - 02/01/16 0954      Balance Exercises: Standing   Rockerboard --           OT Education - 02/01/16 1259    Education provided Yes   Education Details Written home program for memory strategies, putty/pinch exercises & yellow theraband   Person(s) Educated Patient   Methods Explanation;Demonstration;Verbal cues;Handout   Comprehension Verbalized understanding;Returned demonstration;Verbal cues required             OT Long Term Goals - 01/19/16 0857      OT LONG TERM GOAL #1   Title Independent with HEP for LUE strengthening and grip strength (all goals  due 02/17/16)    Time 4   Period Weeks   Status New     OT LONG TERM GOAL #2   Title Grip strength Lt hand to be 42 lbs or greater   Baseline eval = 35 lbs   Time 4   Period Weeks   Status New     OT LONG TERM GOAL #3   Title Independent with hooking buttons and tying shoes   Time 4   Period Weeks   Status New     OT LONG TERM GOAL #4   Title Pt to demo cooking full meal at mod I level safely   Time 4   Period Weeks   Status New     OT LONG TERM GOAL #5   Title Pt to perform organizational/problem solving tasks in mod distracting environment at 90% or greater accuracy   Time 4   Period Weeks   Status New     Long Term Additional Goals   Additional Long Term Goals Yes     OT LONG TERM GOAL #6   Title Pt to verbalize understanding with and implement memory compensatory strategies   Time 4   Period Weeks   Status New               Plan - 02/01/16 1301    Clinical Impression Statement Pt reports that she is performing HEP at least 1 x/day at home. She should benefit from reinforcement of home program and memory strategies.   Rehab Potential Good   OT Frequency 2x / week   OT Duration 4 weeks   OT Treatment/Interventions Self-care/ADL training;DME and/or AE instruction;Patient/family education;Therapeutic exercises;Therapeutic activities;Functional Mobility Training;Cognitive remediation/compensation;Visual/perceptual remediation/compensation;Manual Therapy;Neuromuscular education   Plan LUE strengthening, coordination, reinforcement of HEP, memory strategies.   Consulted and Agree with Plan of Care Patient      Patient will benefit from skilled therapeutic intervention in order to improve the following deficits and impairments:  Decreased endurance, Decreased safety awareness, Impaired sensation, Decreased activity tolerance, Impaired UE functional use, Decreased cognition, Decreased mobility, Decreased strength, Impaired vision/preception, Decreased  balance  Visit Diagnosis: Muscle weakness (generalized)  Hemiplegia, unspecified affecting left nondominant side (HCC)  Attention and concentration deficit    Problem List Patient Active Problem List   Diagnosis Date Noted  . Urinary frequency 09/10/2015  . Dysphagia 02/24/2015  . Traumatic arthritis 02/24/2015  . Fever 02/24/2015  . Low back pain 11/11/2014  . Lower extremity edema 10/07/2014  . Chest pain 02/25/2014  . Chest pain at rest 06/18/2012  . Hypokalemia 06/18/2012  . Anemia 06/18/2012  .  Leukopenia 06/18/2012  . COPD (chronic obstructive pulmonary disease) ([place]) 05/23/2012  . Osteopenia 05/23/2012  . Encounter for preventative adult health care exam with abnormal findings 05/17/2011  . THORACIC/LUMBOSACRAL NEURITIS/RADICULITIS UNSPEC 11/30/2009  . BACK PAIN 11/30/2009  . BACK PAIN WITH RADICULOPATHY 11/30/2009  . DEPRESSION/ANXIETY 06/01/2009  . MYOCARDIAL PERFUSION SCAN, WITH STRESS TEST, ABNORMAL 03/26/2009  . ALLERGIC REACTION, ACUTE 09/05/2008  . PULMONARY NODULE 08/29/2008  . Early satiety 08/25/2008  . WEIGHT LOSS 08/21/2008  . GROIN PAIN 08/21/2008  . Nonspecific (abnormal) findings on radiological and other examination of body structure 08/21/2008  . Hyperlipidemia 12/25/2007  . ANXIETY 12/25/2007  . Essential hypertension 12/25/2007  . ALLERGIC RHINITIS 12/25/2007  . GERD 12/25/2007  . DIVERTICULOSIS, COLON 12/25/2007  . IBS 12/25/2007  . OSTEOPENIA 12/25/2007    Carlynn Herald Albert Devaul Beth Dixon, OTR/L 02/01/2016, 1:04 PM  [facility] [address] [place], [place], [address] Phone: [phone]   Fax:  [phone]  Name: KATHYA WILZ MRN: [id number] Date of Birth: November 04, 1945

## 2016-02-02 ENCOUNTER — Ambulatory Visit: Payer: Commercial Managed Care - HMO

## 2016-02-02 NOTE — Therapy (Signed)
[facility] [address] [place], [place], [address] Phone: [phone]   Fax:  [phone]  Physical Therapy Treatment  Patient Details  Name: [name] MRN: [id number] Date of Birth: [birth year] Referring Provider: Dr. Leta Baptist  Encounter Date: 02/01/2016      PT End of Session - 02/01/16 0936    Visit Number 3   Number of Visits 9   Date for PT Re-Evaluation 02/18/16   Authorization Type [employer] Medicare-no Josem Kaufmann req per Lattie Haw. G-CODE AND PROGRESS NOTE EVERY 10TH VISIT.    PT Start Time [phone]   PT Stop Time 1013   PT Time Calculation (min) 40 min   Equipment Utilized During Treatment Gait belt   Activity Tolerance Patient tolerated treatment well   Behavior During Therapy WFL for tasks assessed/performed      Past Medical History:  Diagnosis Date  . Allergic rhinitis   . Anxiety   . COPD (chronic obstructive pulmonary disease) ([place]) 05/23/2012  . Diverticulosis   . DJD (degenerative joint disease)   . Esophageal diverticulum   . Esophageal stricture   . GERD (gastroesophageal reflux disease)   . Hiatal hernia   . Hyperlipidemia 05/23/2012   T. Chol 234, LDL 130   . Hypertension   . IBS (irritable bowel syndrome)   . Osteopenia   . Traumatic arthritis     Past Surgical History:  Procedure Laterality Date  . CERVICAL SPINE SURGERY  2005  . LUNG BIOPSY Left    left side, not cancerous, thoracotomy  . VAGINAL HYSTERECTOMY      There were no vitals filed for this visit.      Subjective Assessment - 02/01/16 0936    Subjective No new complaints. No falls.    Pertinent History HTN, hyperlipidemia, osteopenia, anxiety, depression, COPD, IBS, traumatic arthritis   Patient Stated Goals Strengthen the L side    Currently in Pain? No/denies           [facility] Adult PT Treatment/Exercise - 02/01/16 0937      High Level Balance   High Level Balance Activities Backward walking;Tandem  walking;Marching forwards;Marching backwards  tandem fwd/bwd, toe walking fwd/bwd, heel walking fwd/bwd   High Level Balance Comments on red mats: 3 laps each/each way with min guard assist and cues on technique/form. veering noted with increased assistance needed in single leg stance for balance             Balance Exercises - 02/01/16 1010      Balance Exercises: Standing   SLS with Vectors Foam/compliant surface;Other reps (comment);Limitations   Rockerboard Anterior/posterior;Lateral;Head turns;EC;20 seconds;10 reps   Balance Beam standing with feet across blue foam balance beam: heel taps fwd, toe taps bwd, alternating legs with no UE support. cues on posture and weight shifting with up to min assist for balance.     Balance Exercises: Standing   SLS with Vectors Limitations 6 cones along edge of mat: alternating fwd toe taps toe each with side stepping left<>right, alternating fwd double toe taps to each with side stepping left<>right,    Rebounder Limitations performed both ways on balance board with up to min assist for balance: EC no head movements 30 sec's x 3 reps, EC head movements up<>down, left<>right and diagonals both ways x 10 reps with min guard to min assist for balance.  PT Short Term Goals - 01/19/16 0626      PT SHORT TERM GOAL #1   Title same as LTGs           PT Long Term Goals - 01/19/16 9485      PT LONG TERM GOAL #1   Title Pt will be IND in HEP to improve strength and balance. TARGET DATE FOR ALL LTGS: 02/16/16   Status New     PT LONG TERM GOAL #2   Title Pt will improve FGA score to >/=27/30 to decr. falls risk.    Status New     PT LONG TERM GOAL #3   Title Pt will ambulate 1000', IND, over even/uneven terrain to improve functional mobility.    Status New     PT LONG TERM GOAL #4   Title Have pt complete FOTO and write goal.    Status New               Plan - 02/01/16 4627    Clinical  Impression Statement today's session focused on high level balance activities without any issues reported. Pt is making steady progress toward goals and should benefit from continued PT to progress toward unmet goals.    Rehab Potential Good   Clinical Impairments Affecting Rehab Potential co-morbidities   PT Frequency 2x / week   PT Duration 4 weeks   PT Treatment/Interventions ADLs/Self Care Home Management;Biofeedback;Patient/family education;Orthotic Fit/Training;DME Instruction;Gait training;Stair training;Canalith Repostioning;Neuromuscular re-education;Balance training;Therapeutic exercise;Therapeutic activities;Functional mobility training;Manual techniques;Vestibular   PT Next Visit Plan balance and strenghthening for LE's toward LTGs.   Consulted and Agree with Plan of Care Patient      Patient will benefit from skilled therapeutic intervention in order to improve the following deficits and impairments:  Abnormal gait, Decreased endurance, Decreased knowledge of use of DME, Decreased strength, Decreased coordination, Decreased balance, Decreased mobility  Visit Diagnosis: Hemiplegia, unspecified affecting left nondominant side (HCC)  Other abnormalities of gait and mobility     Problem List Patient Active Problem List   Diagnosis Date Noted  . Urinary frequency 09/10/2015  . Dysphagia 02/24/2015  . Traumatic arthritis 02/24/2015  . Fever 02/24/2015  . Low back pain 11/11/2014  . Lower extremity edema 10/07/2014  . Chest pain 02/25/2014  . Chest pain at rest 06/18/2012  . Hypokalemia 06/18/2012  . Anemia 06/18/2012  . Leukopenia 06/18/2012  . COPD (chronic obstructive pulmonary disease) ([place]) 05/23/2012  . Osteopenia 05/23/2012  . Encounter for preventative adult health care exam with abnormal findings 05/17/2011  . THORACIC/LUMBOSACRAL NEURITIS/RADICULITIS UNSPEC 11/30/2009  . BACK PAIN 11/30/2009  . BACK PAIN WITH RADICULOPATHY 11/30/2009  . DEPRESSION/ANXIETY  06/01/2009  . MYOCARDIAL PERFUSION SCAN, WITH STRESS TEST, ABNORMAL 03/26/2009  . ALLERGIC REACTION, ACUTE 09/05/2008  . PULMONARY NODULE 08/29/2008  . Early satiety 08/25/2008  . WEIGHT LOSS 08/21/2008  . GROIN PAIN 08/21/2008  . Nonspecific (abnormal) findings on radiological and other examination of body structure 08/21/2008  . Hyperlipidemia 12/25/2007  . ANXIETY 12/25/2007  . Essential hypertension 12/25/2007  . ALLERGIC RHINITIS 12/25/2007  . GERD 12/25/2007  . DIVERTICULOSIS, COLON 12/25/2007  . IBS 12/25/2007  . OSTEOPENIA 12/25/2007   Willow Ora, PTA, [facility] [address], [place], [place] [address] [phone] 02/02/16, 2:20 PM   Name: [name] MRN: [id number] Date of Birth: 1945-09-15

## 2016-02-03 ENCOUNTER — Ambulatory Visit (INDEPENDENT_AMBULATORY_CARE_PROVIDER_SITE_OTHER): Payer: Commercial Managed Care - HMO | Admitting: Diagnostic Neuroimaging

## 2016-02-03 ENCOUNTER — Ambulatory Visit: Payer: Commercial Managed Care - HMO

## 2016-02-03 ENCOUNTER — Encounter: Payer: Self-pay | Admitting: Diagnostic Neuroimaging

## 2016-02-03 VITALS — BP 125/74 | HR 65 | Wt 171.6 lb

## 2016-02-03 DIAGNOSIS — G8194 Hemiplegia, unspecified affecting left nondominant side: Secondary | ICD-10-CM | POA: Diagnosis not present

## 2016-02-03 DIAGNOSIS — R41840 Attention and concentration deficit: Secondary | ICD-10-CM | POA: Diagnosis not present

## 2016-02-03 DIAGNOSIS — R2689 Other abnormalities of gait and mobility: Secondary | ICD-10-CM

## 2016-02-03 DIAGNOSIS — I610 Nontraumatic intracerebral hemorrhage in hemisphere, subcortical: Secondary | ICD-10-CM

## 2016-02-03 DIAGNOSIS — M6281 Muscle weakness (generalized): Secondary | ICD-10-CM | POA: Diagnosis not present

## 2016-02-03 DIAGNOSIS — R41844 Frontal lobe and executive function deficit: Secondary | ICD-10-CM | POA: Diagnosis not present

## 2016-02-03 DIAGNOSIS — R208 Other disturbances of skin sensation: Secondary | ICD-10-CM | POA: Diagnosis not present

## 2016-02-03 NOTE — Patient Instructions (Signed)

## 2016-02-03 NOTE — Therapy (Signed)
[facility] [address] [place], [place], [address] Phone: [phone]   Fax:  [phone]  Physical Therapy Treatment  Patient Details  Name: [name] MRN: [id number] Date of Birth: Jul 25, 1945 Referring Provider: Dr. Leta Baptist  Encounter Date: 02/03/2016      PT End of Session - 02/03/16 1039    Visit Number 4   Number of Visits 9   Date for PT Re-Evaluation 02/18/16   Authorization Type [employer] Medicare-no Josem Kaufmann req per Lattie Haw. G-CODE AND PROGRESS NOTE EVERY 10TH VISIT.    PT Start Time 0848   PT Stop Time 0928   PT Time Calculation (min) 40 min   Equipment Utilized During Treatment Gait belt   Activity Tolerance Patient tolerated treatment well   Behavior During Therapy WFL for tasks assessed/performed      Past Medical History:  Diagnosis Date  . Allergic rhinitis   . Anxiety   . COPD (chronic obstructive pulmonary disease) ([place]) 05/23/2012  . Diverticulosis   . DJD (degenerative joint disease)   . Esophageal diverticulum   . Esophageal stricture   . GERD (gastroesophageal reflux disease)   . Hiatal hernia   . Hyperlipidemia 05/23/2012   T. Chol 234, LDL 130   . Hypertension   . IBS (irritable bowel syndrome)   . Osteopenia   . Traumatic arthritis     Past Surgical History:  Procedure Laterality Date  . CERVICAL SPINE SURGERY  2005  . LUNG BIOPSY Left    left side, not cancerous, thoracotomy  . VAGINAL HYSTERECTOMY      There were no vitals filed for this visit.      Subjective Assessment - 02/03/16 0852    Subjective Pt denied falls or changes since last visit. Pt reported she had a HA this morning but she took medication and feels better now. Pt has an appt. with MD today and wants to tell doctor about how she can still hear the sound of her head hitting the cement floor when she fell. Pt c/o intermittent dizziness during STS txfs, especially in the morning.    Pertinent History HTN,  hyperlipidemia, osteopenia, anxiety, depression, COPD, IBS, traumatic arthritis   Patient Stated Goals Strengthen the L side    Currently in Pain? No/denies                Vestibular Assessment - 02/03/16 0918      Orthostatics   BP supine (x 5 minutes) 139/76   HR supine (x 5 minutes) 69   BP sitting 120/70   HR sitting 71   BP standing (after 1 minute) 108/63   HR standing (after 1 minute) 72   Orthostatics Comment Pt reported lightheadedness during supine to sit txf.  Pt denied dizziness or lightheadedness upon standing.                 [facility] Adult PT Treatment/Exercise - 02/03/16 0855      Ambulation/Gait   Ambulation/Gait Yes   Ambulation/Gait Assistance 5: Supervision   Ambulation/Gait Assistance Details Pt amb. outdoors over paved surfaces while performing head turns, no LOB. No c/o dizziness.    Ambulation Distance (Feet) 600 Feet   Assistive device None   Gait Pattern Step-through pattern;Decreased stride length;Decreased stance time - left   Ambulation Surface Level;Unlevel;Indoor;Outdoor;Paved     High Level Balance   High Level Balance Activities Negotitating around obstacles;Negotiating over obstacles   High Level Balance Comments On compliant (red and blue mats) and  non-compliant surfaces: pt negotiated over small and tall hurdles and negotiated around cones, 4x5 hurdles/cones/activity/surface. Cues for improved wt. shifting and progressed to looking straight ahead. Pt required min guard to min A to maintain balance.                 PT Education - 02/03/16 1038    Education provided Yes   Education Details Pt educated pt on s/s consistent with orthostatic hypotension during PT session today. PT encouraged pt to inform MD and pt stated she has an appt this afternoon with MD. PT informed pt that PT would send today's note to MD regarding orthostatic hypotension. PT provided handout/education on orthostatic hypotension to pt.    Person(s)  Educated Patient   Methods Explanation;Handout   Comprehension Verbalized understanding          PT Short Term Goals - 01/19/16 0922      PT SHORT TERM GOAL #1   Title same as LTGs           PT Long Term Goals - 01/19/16 8469      PT LONG TERM GOAL #1   Title Pt will be IND in HEP to improve strength and balance. TARGET DATE FOR ALL LTGS: 02/16/16   Status New     PT LONG TERM GOAL #2   Title Pt will improve FGA score to >/=27/30 to decr. falls risk.    Status New     PT LONG TERM GOAL #3   Title Pt will ambulate 1000', IND, over even/uneven terrain to improve functional mobility.    Status New     PT LONG TERM GOAL #4   Title Have pt complete FOTO and write goal.    Status New               Plan - 02/03/16 1040    Clinical Impression Statement Pt demonstrated progress, as she was able to progress to less assist during high level balance activities and perform gait with head turns over paved surfaces with less assist. Pt's s/s were consistent with orthostatic hypotension, as she experienced lightheadedness during supine to sit txf and systolic BP decr. >62XBMW and diastolic BP decr. >/=55mmHg during orthostatic testing. PT will send note to MD. Pt would continue to benefit from skilled PT to improve safety and IND during functional mobility.    Rehab Potential Good   Clinical Impairments Affecting Rehab Potential co-morbidities and orthostatic hypotension   PT Frequency 2x / week   PT Duration 4 weeks   PT Treatment/Interventions ADLs/Self Care Home Management;Biofeedback;Patient/family education;Orthotic Fit/Training;DME Instruction;Gait training;Stair training;Canalith Repostioning;Neuromuscular re-education;Balance training;Therapeutic exercise;Therapeutic activities;Functional mobility training;Manual techniques;Vestibular   PT Next Visit Plan Continue high level balance training on compliant surfaces and LE strength training (add LE strengthening to HEP).    Consulted and Agree with Plan of Care Patient      Patient will benefit from skilled therapeutic intervention in order to improve the following deficits and impairments:  Abnormal gait, Decreased endurance, Decreased knowledge of use of DME, Decreased strength, Decreased coordination, Decreased balance, Decreased mobility  Visit Diagnosis: Other abnormalities of gait and mobility  Hemiplegia, unspecified affecting left nondominant side [facility])     Problem List Patient Active Problem List   Diagnosis Date Noted  . Urinary frequency 09/10/2015  . Dysphagia 02/24/2015  . Traumatic arthritis 02/24/2015  . Fever 02/24/2015  . Low back pain 11/11/2014  . Lower extremity edema 10/07/2014  . Chest pain 02/25/2014  . Chest pain at  rest 06/18/2012  . Hypokalemia 06/18/2012  . Anemia 06/18/2012  . Leukopenia 06/18/2012  . COPD (chronic obstructive pulmonary disease) ([place]) 05/23/2012  . Osteopenia 05/23/2012  . Encounter for preventative adult health care exam with abnormal findings 05/17/2011  . THORACIC/LUMBOSACRAL NEURITIS/RADICULITIS UNSPEC 11/30/2009  . BACK PAIN 11/30/2009  . BACK PAIN WITH RADICULOPATHY 11/30/2009  . DEPRESSION/ANXIETY 06/01/2009  . MYOCARDIAL PERFUSION SCAN, WITH STRESS TEST, ABNORMAL 03/26/2009  . ALLERGIC REACTION, ACUTE 09/05/2008  . PULMONARY NODULE 08/29/2008  . Early satiety 08/25/2008  . WEIGHT LOSS 08/21/2008  . GROIN PAIN 08/21/2008  . Nonspecific (abnormal) findings on radiological and other examination of body structure 08/21/2008  . Hyperlipidemia 12/25/2007  . ANXIETY 12/25/2007  . Essential hypertension 12/25/2007  . ALLERGIC RHINITIS 12/25/2007  . GERD 12/25/2007  . DIVERTICULOSIS, COLON 12/25/2007  . IBS 12/25/2007  . OSTEOPENIA 12/25/2007    Miller,[name] L 02/03/2016, 10:46 AM  [facility] [address] [place], [place], [address] Phone: [phone]   Fax:   [phone]  Name: MARRIETTA THUNDER MRN: [id number] Date of Birth: 1945-12-18  Geoffry Paradise, PT,DPT 02/03/16 10:47 AM Phone: [phone] Fax: [phone]

## 2016-02-03 NOTE — Progress Notes (Signed)
[facility]  PATIENT: [name] DOB: 04-Jul-1945  REFERRING CLINICIAN: A Ramachandran HISTORY FROM: patient  REASON FOR VISIT: follow up     HISTORICAL  CHIEF COMPLAINT:  Chief Complaint  Patient presents with  . Other    rm 6, Traumatic hemorrhage of R cerebrum w/LOC from fall, "episodes of PTSD- remembering my fall; episodes are decreasing"  . Follow-up    6 week    HISTORY OF PRESENT ILLNESS:   UPDATE 02/03/16: Since last visit, sxs are improving. Some PTSD from fall, but improving. Overall HA, balance and BP are getting better. Now getting PT and feeling better.  PRIOR HPI (12/04/15): 70 year old right-handed female here for evaluation of trip and fall, trauma, concussion, intracerebral hemorrhage. 11/28/15 patient was at the grocery store when she slipped on a loose water drain grate with her right foot, got twisted up, and then fell towards her left side. She fell forward striking the floor with her left forehead. She blacked out for a few seconds and then woke up. When she woke up she was dizzy, stunned, dazed, confused. Bystanders called 911 and patient was taken to local urgent care. She was treated for left lip laceration. She had some pain over her left eyebrow. Patient then went to PCP the next day for evaluation due to persistent headache. 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. Patient referred to me for further evaluation. Since that time patient continues to have near continuous headaches in the frontal region, retro-orbital, with pressure sensation. Headaches are slightly more in the left and right side. No nausea or vomiting. No photophobia or phonophobia. Patient denies any focal numbness, weakness, slurred speech, memory loss. Patient has been having some trouble sleeping. Patient denies any onset of left-sided weakness prior to falling down. Patient has a  history of hypertension and is on medication. When she went to urgent care apparently her systolic blood pressure was in the 190s. Today's systolic blood pressure 867Y.   REVIEW OF SYSTEMS: Full 14 system review of systems performed and negative with exception of: Headache dizziness sleepiness spinning sensation.   ALLERGIES: Allergies  Allergen Reactions  . Ace Inhibitors Anaphylaxis    REACTION: angioedema  . Other Anaphylaxis    Calcium Channel Blocking Agent Diltiazem Analogues  . Pneumococcal Vaccines     Red rash  Knot (sore and itches) A fever for a couple days (per pt report)  . Tetanus Toxoid     Cellulitis in arm    HOME MEDICATIONS: Outpatient Medications Prior to Visit  Medication Sig Dispense Refill  . amLODipine (NORVASC) 10 MG tablet Take 1 tablet (10 mg total) by mouth daily. 30 tablet 6  . aspirin EC 81 MG tablet Take 81 mg by mouth daily.    [name] atorvastatin (LIPITOR) 10 MG tablet Take 1 tablet (10 mg total) by mouth daily. 90 tablet 3  . HYDROcodone-acetaminophen (NORCO/VICODIN) 5-325 MG tablet Take 2 tablets by mouth every 4 (four) hours as needed for moderate pain or severe pain. 10 tablet 0  . meloxicam (MOBIC) 7.5 MG tablet Take 7.5 mg by mouth 2 (two) times daily. As needed  0  . pantoprazole (PROTONIX) 40 MG tablet Take 1 tablet (40 mg total) by mouth daily. 90 tablet 3  . tiZANidine (ZANAFLEX) 4 MG tablet 4 mg as needed. Pt unsure if she takes this  0  . venlafaxine XR (EFFEXOR-XR) 75 MG  24 hr capsule Take 1 capsule by mouth daily.  0   No facility-administered medications prior to visit.     PAST MEDICAL HISTORY: Past Medical History:  Diagnosis Date  . Allergic rhinitis   . Anxiety   . COPD (chronic obstructive pulmonary disease) ([place]) 05/23/2012  . Diverticulosis   . DJD (degenerative joint disease)   . Esophageal diverticulum   . Esophageal stricture   . GERD (gastroesophageal reflux disease)   . Hiatal hernia   . Hyperlipidemia 05/23/2012     T. Chol 234, LDL 130   . Hypertension   . IBS (irritable bowel syndrome)   . Osteopenia   . Traumatic arthritis     PAST SURGICAL HISTORY: Past Surgical History:  Procedure Laterality Date  . CERVICAL SPINE SURGERY  2005  . LUNG BIOPSY Left    left side, not cancerous, thoracotomy  . VAGINAL HYSTERECTOMY      FAMILY HISTORY: Family History  Problem Relation Age of Onset  . Throat cancer Father   . Hypertension Sister   . Thyroid disease Daughter   . Hypertension Sister     x 2  . Breast cancer Maternal Aunt   . Blindness Sister     legally blind  . Colon cancer Neg Hx   . Stomach cancer Neg Hx     SOCIAL HISTORY:  Social History   Social History  . Marital status: Single    Spouse name: N/A  . Number of children: 2  . Years of education: 12   Occupational History  . Retired Chartered certified accountant    Social History Main Topics  . Smoking status: Former Smoker    Types: Cigarettes    Quit date: 05/04/1985  . Smokeless tobacco: Never Used  . Alcohol use 0.0 oz/week     Comment: occasional  . Drug use: No  . Sexual activity: Not on file   Other Topics Concern  . Not on file   Social History Narrative   Lives alone   Caffeine -tea, 1 cup daily;  Pepsi maybe one a day     PHYSICAL EXAM  GENERAL EXAM/CONSTITUTIONAL: Vitals:  Vitals:   02/03/16 1416  BP: 125/74  Pulse: 65  Weight: 171 lb 9.6 oz (77.8 kg)   Body mass index is 30.4 kg/m. No exam data present  Patient is in no distress; well developed, nourished and groomed; neck is supple  CARDIOVASCULAR:  Examination of carotid arteries is normal; no carotid bruits  Regular rate and rhythm, no murmurs  Examination of peripheral vascular system by observation and palpation is normal  EYES:  Ophthalmoscopic exam of optic discs and posterior segments is normal; no papilledema or hemorrhages  MUSCULOSKELETAL:  Gait, strength, tone, movements noted in Neurologic exam below  NEUROLOGIC: MENTAL  STATUS:  No flowsheet data found.  awake, alert, oriented to person, place and time  recent and remote memory intact  normal attention and concentration  language fluent, comprehension intact, naming intact,   fund of knowledge appropriate  CRANIAL NERVE:   2nd - no papilledema on fundoscopic exam  2nd, 3rd, 4th, 6th - pupils equal and reactive to light, visual fields full to confrontation, extraocular muscles intact, no nystagmus  5th - facial sensation symmetric  7th - facial strength symmetric  8th - hearing intact  9th - palate elevates symmetrically, uvula midline  11th - shoulder shrug symmetric  12th - tongue protrusion midline  MOTOR:   normal bulk and tone, full strength in the BUE,  BLE; EXCEPT LEFT HIP FLEXION AND LEFT FOOT DORSIFLEXION 4/5  SENSORY:   normal and symmetric to light touch, temperature, vibration; EXCEPT DECR LT IN LEFT ARM AND DECR TEMP IN LEFT FOOT  COORDINATION:   finger-nose-finger, fine finger movements normal; EXCEPT SLIGHTLY SLOWER IN LEFT HAND  REFLEXES:   deep tendon reflexes present and symmetric  GAIT/STATION:   narrow based gait; romberg is negative    DIAGNOSTIC DATA (LABS, IMAGING, TESTING) - I reviewed patient records, labs, notes, testing and imaging myself where available.  Lab Results  Component Value Date   WBC 4.8 09/10/2015   HGB 12.3 09/10/2015   HCT 37.5 09/10/2015   MCV 92.7 09/10/2015   PLT 247.0 09/10/2015      Component Value Date/Time   NA 142 09/10/2015 1143   K 3.9 09/10/2015 1143   CL 105 09/10/2015 1143   CO2 30 09/10/2015 1143   GLUCOSE 87 09/10/2015 1143   BUN 13 09/10/2015 1143   CREATININE 0.93 09/10/2015 1143   CALCIUM 9.3 09/10/2015 1143   PROT 7.4 09/10/2015 1143   ALBUMIN 4.1 09/10/2015 1143   AST 21 09/10/2015 1143   ALT 17 09/10/2015 1143   ALKPHOS 131 (H) 09/10/2015 1143   BILITOT 0.5 09/10/2015 1143   GFRNONAA 64 (L) 06/19/2012 0440   GFRAA 74 (L) 06/19/2012 0440    Lab Results  Component Value Date   CHOL 174 09/10/2015   HDL 66.60 09/10/2015   LDLCALC 86 09/10/2015   LDLDIRECT 143.2 05/28/2013   TRIG 103.0 09/10/2015   CHOLHDL 3 09/10/2015   Lab Results  Component Value Date   HGBA1C 5.8 02/02/2010   Lab Results  Component Value Date   VITAMINB12 358 06/29/2012   Lab Results  Component Value Date   TSH 1.37 09/10/2015    12/02/15 CT head [I reviewed images myself and agree with interpretation. Subtle left tentorial subdural ICH noted as well. -VRP]  - Subacute intraparenchymal hemorrhage in the right basal ganglia/internal capsule. Two adjacent hematomas, the larger measuring 10 mm in the smaller measuring 8 mm. Mild surrounding edema.  12/16/15 Abnormal MRI brain (with and without): [I reviewed images myself and agree with interpretation. -VRP]  1. Again seen are 2 ovoid foci of subacute-chronic intracerebral hemorrhage (45mm and 63mm on axial views). These are hypo/iso-intense on T1, hyperintense on T2, and hypointense on SWI views. Both foci demonstrate rim enhancement. Mild adjacent edema/gliosis next to the right lateral ventricle. Most likely represent subacute-chronic hemorrhagic infarcts. Traumatic hemorrhage less likely.  2. Mild subcortical and periventricular chronic small vessel ischemic disease. 3. No acute findings.     ASSESSMENT AND PLAN  70 y.o. year old female here with History of hypertension, status post fall, head trauma, now with posttraumatic headaches, right basal ganglia intercerebral hemorrhage, and mild left hand and leg weakness.   The question remains whether this represents a traumatic intracerebral hemorrhage versus primary hypertensive intracerebral hemorrhage leading to patient falling down. The imaging characteristics would be more consistent with primary hypertensive intracerebral hemorrhage. However by history patient reports that the trip and fall were unprovoked by her own weakness and rather by a  loose water drain grate.   Dx: right basal ganglia intracerebral hemorrhage (likely due to HTN / infarct; less likely post-trauma)  1. Nontraumatic subcortical hemorrhage of right cerebral hemisphere [facility])      PLAN: - gradually increase activities - continue BP control - repeat MRI in 3 months to ensure resolution of hemorrhage and rule out mass  Orders Placed This Encounter  Procedures  . MR Brain W Wo Contrast   Return in about 3 months (around 05/04/2016).    Penni Bombard, MD 7/35/7897, 8:47 PM Certified in Neurology, Neurophysiology and Neuroimaging  [facility] [address], [place], [place] [address] [phone]

## 2016-02-04 ENCOUNTER — Ambulatory Visit: Payer: Commercial Managed Care - HMO | Admitting: *Deleted

## 2016-02-04 ENCOUNTER — Encounter: Payer: Self-pay | Admitting: *Deleted

## 2016-02-04 DIAGNOSIS — M6281 Muscle weakness (generalized): Secondary | ICD-10-CM

## 2016-02-04 DIAGNOSIS — R2689 Other abnormalities of gait and mobility: Secondary | ICD-10-CM | POA: Diagnosis not present

## 2016-02-04 DIAGNOSIS — R208 Other disturbances of skin sensation: Secondary | ICD-10-CM | POA: Diagnosis not present

## 2016-02-04 DIAGNOSIS — G8194 Hemiplegia, unspecified affecting left nondominant side: Secondary | ICD-10-CM | POA: Diagnosis not present

## 2016-02-04 DIAGNOSIS — R41844 Frontal lobe and executive function deficit: Secondary | ICD-10-CM | POA: Diagnosis not present

## 2016-02-04 DIAGNOSIS — R41840 Attention and concentration deficit: Secondary | ICD-10-CM

## 2016-02-04 NOTE — Therapy (Signed)
[facility] [address] [place], [place], [address] Phone: [phone]   Fax:  [phone]  Occupational Therapy Treatment  Patient Details  Name: [name] MRN: [id number] Date of Birth: 02-13-1946 Referring Provider: Dr. Andrey Spearman  Encounter Date: 02/04/2016      OT End of Session - 02/04/16 1322    Visit Number 4   Number of Visits 9   Date for OT Re-Evaluation 02/17/16   Authorization Type [employer] MCR   Authorization Time Period No auth required, no visit limit   Authorization - Visit Number 4   Authorization - Number of Visits 10   OT Start Time [phone]   OT Stop Time [phone]   OT Time Calculation (min) 42 min   Activity Tolerance Patient tolerated treatment well   Behavior During Therapy [facility] for tasks assessed/performed      Past Medical History:  Diagnosis Date  . Allergic rhinitis   . Anxiety   . COPD (chronic obstructive pulmonary disease) ([place]) 05/23/2012  . Diverticulosis   . DJD (degenerative joint disease)   . Esophageal diverticulum   . Esophageal stricture   . GERD (gastroesophageal reflux disease)   . Hiatal hernia   . Hyperlipidemia 05/23/2012   T. Chol 234, LDL 130   . Hypertension   . IBS (irritable bowel syndrome)   . Osteopenia   . Traumatic arthritis     Past Surgical History:  Procedure Laterality Date  . CERVICAL SPINE SURGERY  2005  . LUNG BIOPSY Left    left side, not cancerous, thoracotomy  . VAGINAL HYSTERECTOMY      There were no vitals filed for this visit.      Subjective Assessment - 02/04/16 0851    Subjective  Pt denies pain. Reports that she f/u w/ MD yesterday "My Left arm is getting stronger, but left leg is still weak".   Pertinent History neck surgery over 10 years ago   Patient Stated Goals Strengthen Lt side   Currently in Pain? No/denies              Vestibular Assessment - 02/03/16 0918      Orthostatics   BP supine (x 5 minutes)  139/76   HR supine (x 5 minutes) 69   BP sitting 120/70   HR sitting 71   BP standing (after 1 minute) 108/63   HR standing (after 1 minute) 72   Orthostatics Comment Pt reported lightheadedness during supine to sit txf.  Pt denied dizziness or lightheadedness upon standing.                OT Treatments/Exercises (OP) - 02/04/16 0001      ADLs   Overall ADLs Review memory compensation strategies with pt . "I've tried writing sticky notes and I've also not one it, to see if I will remember.... I forgot".   ADL Comments "I'm going to try to vacuum later, it can be hard sometimes" Pt reports mod I for laundery and other homemaking tasks. VC's to use bilateral UE's for tasks as she reports using primarily right dominant hand.     Shoulder Exercises: ROM/Strengthening   UBE (Upper Arm Bike) Deferred UBE today as pt plans to vacuum at home later today. Discussed that functional activity is good use of LUE as as assist as able.     Other ROM/Strengthening Exercises Bicep curl seated in chair with yellow theraband x10 reps each     Shoulder Exercises: Isometric Strengthening  Flexion Theraband   Theraband Level (Flexion) Level 1 (Yellow)   Flexion Limitations Standing    Extension Theraband   Theraband Level (Extension) Level 1 (Yellow)   ABduction Theraband   Theraband Level (ABduction) Level 1 (Yellow)  Seated for horizontal ABD x10 reps each     Fine Motor Coordination   Fine Motor Coordination Large Pegboard;Small Pegboard   Large Pegboard Left hand picking up 1 and 2 pegs at a time and placing into board x5 rows. With reports of fatigue L shoulder.  Removing pegs 3 at a time left hand and placing one back into container.   Small Pegboard Small board, copying pattern with left hand for FM/coordination and manipulation. Pt dropping pegs/min difficulty.     Hand Exercises   Other Hand Exercises Gripper position 2 to pick up blocks w/ left hand and release into bowl. Pt with  min-mod difficulty as she began to fatigue. x 2 sets of 20                OT Education - 02/04/16 1321    Education provided Yes   Education Details Pt to cont home program for LUE functional use, coordination and strengthening. Memory strategies    Person(s) Educated Patient   Methods Explanation;Verbal cues   Comprehension Verbalized understanding             OT Long Term Goals - 01/19/16 0857      OT LONG TERM GOAL #1   Title Independent with HEP for LUE strengthening and grip strength (all goals due 02/17/16)    Time 4   Period Weeks   Status New     OT LONG TERM GOAL #2   Title Grip strength Lt hand to be 42 lbs or greater   Baseline eval = 35 lbs   Time 4   Period Weeks   Status New     OT LONG TERM GOAL #3   Title Independent with hooking buttons and tying shoes   Time 4   Period Weeks   Status New     OT LONG TERM GOAL #4   Title Pt to demo cooking full meal at mod I level safely   Time 4   Period Weeks   Status New     OT LONG TERM GOAL #5   Title Pt to perform organizational/problem solving tasks in mod distracting environment at 90% or greater accuracy   Time 4   Period Weeks   Status New     Long Term Additional Goals   Additional Long Term Goals Yes     OT LONG TERM GOAL #6   Title Pt to verbalize understanding with and implement memory compensatory strategies   Time 4   Period Weeks   Status New               Plan - 02/04/16 1323    Clinical Impression Statement Pt reports using LUE during therapy exercises and reports improved strength. She should benefit from cont memory strategies, coordination and overall functional use of LUE as non-dominant hand.   Rehab Potential Good   OT Frequency 2x / week   OT Duration 4 weeks   OT Treatment/Interventions Self-care/ADL training;DME and/or AE instruction;Patient/family education;Therapeutic exercises;Therapeutic activities;Functional Mobility Training;Cognitive  remediation/compensation;Visual/perceptual remediation/compensation;Manual Therapy;Neuromuscular education   Plan LUE strengthening, coordination, memory strategies   Consulted and Agree with Plan of Care Patient      Patient will benefit from skilled therapeutic intervention in order to improve  the following deficits and impairments:  Decreased endurance, Decreased safety awareness, Impaired sensation, Decreased activity tolerance, Impaired UE functional use, Decreased cognition, Decreased mobility, Decreased strength, Impaired vision/preception, Decreased balance  Visit Diagnosis: Muscle weakness (generalized)  Hemiplegia, unspecified affecting left nondominant side (HCC)  Attention and concentration deficit    Problem List Patient Active Problem List   Diagnosis Date Noted  . Urinary frequency 09/10/2015  . Dysphagia 02/24/2015  . Traumatic arthritis 02/24/2015  . Fever 02/24/2015  . Low back pain 11/11/2014  . Lower extremity edema 10/07/2014  . Chest pain 02/25/2014  . Chest pain at rest 06/18/2012  . Hypokalemia 06/18/2012  . Anemia 06/18/2012  . Leukopenia 06/18/2012  . COPD (chronic obstructive pulmonary disease) ([place]) 05/23/2012  . Osteopenia 05/23/2012  . Encounter for preventative adult health care exam with abnormal findings 05/17/2011  . THORACIC/LUMBOSACRAL NEURITIS/RADICULITIS UNSPEC 11/30/2009  . BACK PAIN 11/30/2009  . BACK PAIN WITH RADICULOPATHY 11/30/2009  . DEPRESSION/ANXIETY 06/01/2009  . MYOCARDIAL PERFUSION SCAN, WITH STRESS TEST, ABNORMAL 03/26/2009  . ALLERGIC REACTION, ACUTE 09/05/2008  . PULMONARY NODULE 08/29/2008  . Early satiety 08/25/2008  . WEIGHT LOSS 08/21/2008  . GROIN PAIN 08/21/2008  . Nonspecific (abnormal) findings on radiological and other examination of body structure 08/21/2008  . Hyperlipidemia 12/25/2007  . ANXIETY 12/25/2007  . Essential hypertension 12/25/2007  . ALLERGIC RHINITIS 12/25/2007  . GERD 12/25/2007  .  DIVERTICULOSIS, COLON 12/25/2007  . IBS 12/25/2007  . OSTEOPENIA 12/25/2007    Almyra Deforest, OTR/L 02/04/2016, 1:27 PM  [facility] [address] [place], [place], [address] Phone: [phone]   Fax:  [phone]  Name: [name] MRN: [id number] Date of Birth: 04-05-1946

## 2016-02-08 ENCOUNTER — Encounter: Payer: Self-pay | Admitting: Physical Therapy

## 2016-02-08 ENCOUNTER — Ambulatory Visit: Payer: Commercial Managed Care - HMO | Admitting: *Deleted

## 2016-02-08 ENCOUNTER — Encounter: Payer: Self-pay | Admitting: *Deleted

## 2016-02-08 ENCOUNTER — Ambulatory Visit: Payer: Commercial Managed Care - HMO | Attending: Orthopedic Surgery | Admitting: Physical Therapy

## 2016-02-08 DIAGNOSIS — R41841 Cognitive communication deficit: Secondary | ICD-10-CM | POA: Insufficient documentation

## 2016-02-08 DIAGNOSIS — M6281 Muscle weakness (generalized): Secondary | ICD-10-CM

## 2016-02-08 DIAGNOSIS — R278 Other lack of coordination: Secondary | ICD-10-CM | POA: Insufficient documentation

## 2016-02-08 DIAGNOSIS — I69318 Other symptoms and signs involving cognitive functions following cerebral infarction: Secondary | ICD-10-CM

## 2016-02-08 DIAGNOSIS — R41840 Attention and concentration deficit: Secondary | ICD-10-CM | POA: Diagnosis not present

## 2016-02-08 DIAGNOSIS — R2689 Other abnormalities of gait and mobility: Secondary | ICD-10-CM | POA: Diagnosis not present

## 2016-02-08 NOTE — Therapy (Signed)
[facility] [address] [place], [place], [address] Phone: [phone]   Fax:  [phone]  Physical Therapy Treatment  Patient Details  Name: [name] MRN: [id number] Date of Birth: 02/03/1946 Referring Provider: Dr. Leta Baptist  Encounter Date: 02/08/2016      PT End of Session - 02/08/16 0856    Visit Number 5   Number of Visits 9   Date for PT Re-Evaluation 02/18/16   Authorization Type [employer] Medicare-no Josem Kaufmann req per Lattie Haw. G-CODE AND PROGRESS NOTE EVERY 10TH VISIT.    PT Start Time [phone]   PT Stop Time 0930   PT Time Calculation (min) 40 min   Equipment Utilized During Treatment Gait belt   Activity Tolerance Patient tolerated treatment well   Behavior During Therapy WFL for tasks assessed/performed      Past Medical History:  Diagnosis Date  . Allergic rhinitis   . Anxiety   . COPD (chronic obstructive pulmonary disease) ([place]) 05/23/2012  . Diverticulosis   . DJD (degenerative joint disease)   . Esophageal diverticulum   . Esophageal stricture   . GERD (gastroesophageal reflux disease)   . Hiatal hernia   . Hyperlipidemia 05/23/2012   T. Chol 234, LDL 130   . Hypertension   . IBS (irritable bowel syndrome)   . Osteopenia   . Traumatic arthritis     Past Surgical History:  Procedure Laterality Date  . CERVICAL SPINE SURGERY  2005  . LUNG BIOPSY Left    left side, not cancerous, thoracotomy  . VAGINAL HYSTERECTOMY      There were no vitals filed for this visit.      Subjective Assessment - 02/08/16 0856    Subjective No new complaints. No falls or pain to report.    Pertinent History HTN, hyperlipidemia, osteopenia, anxiety, depression, COPD, IBS, traumatic arthritis   Patient Stated Goals Strengthen the L side    Currently in Pain? No/denies     Treatment: Issued the following to pt's HEP: Bridge    Lie back, legs bent. Arms across chest. Lift hips up and hold for 5 seconds.  Slowly lower hips back down. Repeat __10__ times. Do __1-2__ sessions per day.  http://pm.exer.us/55   Copyright  VHI. All rights reserved.    Strengthening: Hip Abductor - Resisted    Lie down flat with knees bent (not as pictured): With green band looped around both legs above knees, push thighs apart. Hold for 5 seconds and then slowly bring knees back together. Repeat _10_ times per set. Do _1_ sets per session. Do _1-2_ sessions per day.  http://orth.exer.us/688   Copyright  VHI. All rights reserved.    Clam Shells  While lying on your side with your knees bent, red band around legs just above knees, draw up the top knee while keeping contact of your feet together. Hold for 5 seconds.  Do not let your pelvis roll back during the lifting movement.  10 reps with each leg. 1-2 times a day.   ABDUCTION: Standing - Resistance Band (Active)    Hold something sturdy to assist with balance: Stand, feet flat with red resistance band around ankles. Slowly lift right leg out to side and then back in, repeat with left leg (alternating legs).  Complete _1_ sets of _10_ repetitions. Perform _1-2_ sessions per day.  http://gtsc.exer.us/117   Copyright  VHI. All rights reserved.   HIP / KNEE: Extension - Standing (Band)    Hold something sturdy to assist  with balance: Place red band around both legs. Squeeze glutes. Raise and lift leg backward and then slowly back in. Keep knee straight. Repeat with other leg (alternate legs). _10_ reps per set, _1_ sets per day, 1-2 times a day.  Copyright  VHI. All rights reserved.            PT Education - 02/08/16 0931    Education provided Yes   Education Details HEP for LE strengthening   Person(s) Educated Patient   Methods Demonstration;Verbal cues;Handout   Comprehension Verbalized understanding;Returned demonstration          PT Short Term Goals - 01/19/16 4970      PT SHORT TERM GOAL #1   Title same as LTGs            PT Long Term Goals - 01/19/16 2637      PT LONG TERM GOAL #1   Title Pt will be IND in HEP to improve strength and balance. TARGET DATE FOR ALL LTGS: 02/16/16   Status New     PT LONG TERM GOAL #2   Title Pt will improve FGA score to >/=27/30 to decr. falls risk.    Status New     PT LONG TERM GOAL #3   Title Pt will ambulate 1000', IND, over even/uneven terrain to improve functional mobility.    Status New     PT LONG TERM GOAL #4   Title Have pt complete FOTO and write goal.    Status New            Plan - 02/08/16 8588    Clinical Impression Statement today's session focused on establishment of HEP for LE strengthening. No issues reported with performance in session today. Pt is making steady progress toward goals and should benefit from continued PT to progress toward unmet goals.    Rehab Potential Good   Clinical Impairments Affecting Rehab Potential co-morbidities and orthostatic hypotension   PT Frequency 2x / week   PT Duration 4 weeks   PT Treatment/Interventions ADLs/Self Care Home Management;Biofeedback;Patient/family education;Orthotic Fit/Training;DME Instruction;Gait training;Stair training;Canalith Repostioning;Neuromuscular re-education;Balance training;Therapeutic exercise;Therapeutic activities;Functional mobility training;Manual techniques;Vestibular   PT Next Visit Plan Continue high level balance training on compliant surfaces and LE strength training   Consulted and Agree with Plan of Care Patient      Patient will benefit from skilled therapeutic intervention in order to improve the following deficits and impairments:  Abnormal gait, Decreased endurance, Decreased knowledge of use of DME, Decreased strength, Decreased coordination, Decreased balance, Decreased mobility  Visit Diagnosis: Muscle weakness (generalized)  Other abnormalities of gait and mobility     Problem List Patient Active Problem List   Diagnosis Date Noted  .  Urinary frequency 09/10/2015  . Dysphagia 02/24/2015  . Traumatic arthritis 02/24/2015  . Fever 02/24/2015  . Low back pain 11/11/2014  . Lower extremity edema 10/07/2014  . Chest pain 02/25/2014  . Chest pain at rest 06/18/2012  . Hypokalemia 06/18/2012  . Anemia 06/18/2012  . Leukopenia 06/18/2012  . COPD (chronic obstructive pulmonary disease) ([place]) 05/23/2012  . Osteopenia 05/23/2012  . Encounter for preventative adult health care exam with abnormal findings 05/17/2011  . THORACIC/LUMBOSACRAL NEURITIS/RADICULITIS UNSPEC 11/30/2009  . BACK PAIN 11/30/2009  . BACK PAIN WITH RADICULOPATHY 11/30/2009  . DEPRESSION/ANXIETY 06/01/2009  . MYOCARDIAL PERFUSION SCAN, WITH STRESS TEST, ABNORMAL 03/26/2009  . ALLERGIC REACTION, ACUTE 09/05/2008  . PULMONARY NODULE 08/29/2008  . Early satiety 08/25/2008  . WEIGHT LOSS 08/21/2008  . GROIN  PAIN 08/21/2008  . Nonspecific (abnormal) findings on radiological and other examination of body structure 08/21/2008  . Hyperlipidemia 12/25/2007  . ANXIETY 12/25/2007  . Essential hypertension 12/25/2007  . ALLERGIC RHINITIS 12/25/2007  . GERD 12/25/2007  . DIVERTICULOSIS, COLON 12/25/2007  . IBS 12/25/2007  . OSTEOPENIA 12/25/2007    Willow Ora, PTA, [facility] [address] [place], [place] [address] [phone] 02/08/16, 9:32 AM   Name: [name] MRN: [id number] Date of Birth: 12-06-1945

## 2016-02-08 NOTE — Patient Instructions (Addendum)
Bridge    Lie back, legs bent. Arms across chest. Lift hips up and hold for 5 seconds. Slowly lower hips back down. Repeat __10__ times. Do __1-2__ sessions per day.  http://pm.exer.us/55   Copyright  VHI. All rights reserved.    Strengthening: Hip Abductor - Resisted    Lie down flat with knees bent (not as pictured): With green band looped around both legs above knees, push thighs apart. Hold for 5 seconds and then slowly bring knees back together. Repeat _10_ times per set. Do _1_ sets per session. Do _1-2_ sessions per day.  http://orth.exer.us/688   Copyright  VHI. All rights reserved.    Clam Shells  While lying on your side with your knees bent, red band around legs just above knees, draw up the top knee while keeping contact of your feet together. Hold for 5 seconds.  Do not let your pelvis roll back during the lifting movement.  10 reps with each leg. 1-2 times a day.   ABDUCTION: Standing - Resistance Band (Active)    Hold something sturdy to assist with balance: Stand, feet flat with red resistance band around ankles. Slowly lift right leg out to side and then back in, repeat with left leg (alternating legs).  Complete _1_ sets of _10_ repetitions. Perform _1-2_ sessions per day.  http://gtsc.exer.us/117   Copyright  VHI. All rights reserved.   HIP / KNEE: Extension - Standing (Band)    Hold something sturdy to assist with balance: Place red band around both legs. Squeeze glutes. Raise and lift leg backward and then slowly back in. Keep knee straight. Repeat with other leg (alternate legs). _10_ reps per set, _1_ sets per day, 1-2 times a day.  Copyright  VHI. All rights reserved.

## 2016-02-08 NOTE — Therapy (Signed)
[facility] [address] [place], [place], [address] Phone: [phone]   Fax:  [phone]  Occupational Therapy Treatment  Patient Details  Name: [name] MRN: [id number] Date of Birth: Sep 23, 1945 Referring Provider: Dr. Andrey Spearman  Encounter Date: 02/08/2016      OT End of Session - 02/08/16 1242    Visit Number 5   Number of Visits 9   Date for OT Re-Evaluation 02/17/16   Authorization Type [employer] MCR   Authorization Time Period No auth required, no visit limit; G code every 10 th visit   Authorization - Visit Number 5   Authorization - Number of Visits 10   OT Start Time [phone]   OT Stop Time 1014   OT Time Calculation (min) 38 min   Activity Tolerance Patient tolerated treatment well   Behavior During Therapy [facility] for tasks assessed/performed      Past Medical History:  Diagnosis Date  . Allergic rhinitis   . Anxiety   . COPD (chronic obstructive pulmonary disease) ([place]) 05/23/2012  . Diverticulosis   . DJD (degenerative joint disease)   . Esophageal diverticulum   . Esophageal stricture   . GERD (gastroesophageal reflux disease)   . Hiatal hernia   . Hyperlipidemia 05/23/2012   T. Chol 234, LDL 130   . Hypertension   . IBS (irritable bowel syndrome)   . Osteopenia   . Traumatic arthritis     Past Surgical History:  Procedure Laterality Date  . CERVICAL SPINE SURGERY  2005  . LUNG BIOPSY Left    left side, not cancerous, thoracotomy  . VAGINAL HYSTERECTOMY      There were no vitals filed for this visit.      Subjective Assessment - 02/08/16 0938    Subjective  Pt denies pain left arm. "I saw the doctor last week" "I forget things"   Pertinent History neck surgery over 10 years ago   Patient Stated Goals Strengthen Lt side   Currently in Pain? No/denies                      OT Treatments/Exercises (OP) - 02/08/16 0001      ADLs   ADL Comments "I forget  things, I had 2 wash cloths today and I could only find one. I don't know where that thing went" Reviewed/discussed memory strategies. Pt reports that she feels as though her memory "is getting worse, not better"   ADL Education Given --  Pt arrived for appointment 1 day early     Cognitive Exercises   Other Cognitive Exercises 1 Reivewed and discussed memory strategies. Pt presented to OT appointment 1 day early and was able to be seen secondary to cancellation.      Exercises   Exercises Shoulder;Elbow;Hand;Neurological Re-education     Shoulder Exercises: ROM/Strengthening   UBE (Upper Arm Bike) Bilateral UE ex's for UBE level 1 x10 min focus on using left UE equally with right. Rotating forward and backward for strength, endurance LUE.   Other ROM/Strengthening Exercises Reviewed all theraband ex's w/ palan to upgrade as able - consider assessing next visit.     Additional Elbow Exercises   Theraputty - Roll Review putty use left hand     Hand Exercises   Large Pegboard Large pegs in standing. Using LUE to place pegs and reach across her body for reach different planes. Focus on  coordination, reach and functional endurance.   Fine Motor Coordination  Large Pegboard  Mod-max difficulty following pattern. VC/tc's required   In Hand Manipulation Training Removing large pegs 2-3 at a time releasing 1 at a time into bowl for coordination and LUE use. Pt occasionally uses Right hand to grab pegs, able to correct after vc's for correction.   Other Hand Exercises Gripper position 2 to pick up 1 in blocks w/ left hanf and release into bowl. Min difficulty as pt begins to fatigue, dropping 2 blocks LUE.                 OT Education - 02/08/16 1241    Education provided Yes   Education Details Review HEP; Memory compensation strategies.   Person(s) Educated Patient   Methods Explanation;Demonstration;Verbal cues   Comprehension Verbalized understanding             OT Long  Term Goals - 01/19/16 0857      OT LONG TERM GOAL #1   Title Independent with HEP for LUE strengthening and grip strength (all goals due 02/17/16)    Time 4   Period Weeks   Status New     OT LONG TERM GOAL #2   Title Grip strength Lt hand to be 42 lbs or greater   Baseline eval = 35 lbs   Time 4   Period Weeks   Status New     OT LONG TERM GOAL #3   Title Independent with hooking buttons and tying shoes   Time 4   Period Weeks   Status New     OT LONG TERM GOAL #4   Title Pt to demo cooking full meal at mod I level safely   Time 4   Period Weeks   Status New     OT LONG TERM GOAL #5   Title Pt to perform organizational/problem solving tasks in mod distracting environment at 90% or greater accuracy   Time 4   Period Weeks   Status New     Long Term Additional Goals   Additional Long Term Goals Yes     OT LONG TERM GOAL #6   Title Pt to verbalize understanding with and implement memory compensatory strategies   Time 4   Period Weeks   Status New               Plan - 02/08/16 1246    Clinical Impression Statement Pt reports improved strength LUE, reprts feeling like she "forgets things a lot". Pt should benefit from further memory strategies and upgrade HEP as able LUE.   Rehab Potential Good   OT Frequency 2x / week   OT Duration 4 weeks   OT Treatment/Interventions Self-care/ADL training;DME and/or AE instruction;Patient/family education;Therapeutic exercises;Therapeutic activities;Functional Mobility Training;Cognitive remediation/compensation;Visual/perceptual remediation/compensation;Manual Therapy;Neuromuscular education   Plan Memory worksheet, Organizing/planning your day. Upgrade to red theraband as able.   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 Urinary frequency 09/10/2015  . Dysphagia 02/24/2015  . Traumatic arthritis 02/24/2015  . Fever 02/24/2015  . Low  back pain 11/11/2014  . Lower extremity edema 10/07/2014  . Chest pain 02/25/2014  . Chest pain at rest 06/18/2012  . Hypokalemia 06/18/2012  . Anemia 06/18/2012  . Leukopenia 06/18/2012  . COPD (chronic obstructive pulmonary disease) ([place]) 05/23/2012  . Osteopenia 05/23/2012  . Encounter for preventative adult health care exam with abnormal findings 05/17/2011  . THORACIC/LUMBOSACRAL NEURITIS/RADICULITIS UNSPEC 11/30/2009  . BACK PAIN 11/30/2009  . BACK PAIN WITH RADICULOPATHY 11/30/2009  . DEPRESSION/ANXIETY 06/01/2009  . MYOCARDIAL PERFUSION SCAN, WITH STRESS TEST, ABNORMAL 03/26/2009  . ALLERGIC REACTION, ACUTE 09/05/2008  . PULMONARY NODULE 08/29/2008  . Early satiety 08/25/2008  . WEIGHT LOSS 08/21/2008  . GROIN PAIN 08/21/2008  . Nonspecific (abnormal) findings on radiological and other examination of body structure 08/21/2008  . Hyperlipidemia 12/25/2007  . ANXIETY 12/25/2007  . Essential hypertension 12/25/2007  . ALLERGIC RHINITIS 12/25/2007  . GERD 12/25/2007  . DIVERTICULOSIS, COLON 12/25/2007  . IBS 12/25/2007  . OSTEOPENIA 12/25/2007    Almyra Deforest, OTR/L 02/08/2016, 12:50 PM  [facility] [address] [place], [place], [address] Phone: [phone]   Fax:  [phone]  Name: [name] MRN: [id number] Date of Birth: 03-03-1946

## 2016-02-09 ENCOUNTER — Ambulatory Visit: Payer: Commercial Managed Care - HMO | Admitting: *Deleted

## 2016-02-09 ENCOUNTER — Ambulatory Visit: Payer: Commercial Managed Care - HMO | Admitting: Physical Therapy

## 2016-02-11 ENCOUNTER — Ambulatory Visit: Payer: Commercial Managed Care - HMO | Admitting: Occupational Therapy

## 2016-02-11 DIAGNOSIS — R41841 Cognitive communication deficit: Secondary | ICD-10-CM | POA: Diagnosis not present

## 2016-02-11 DIAGNOSIS — I69318 Other symptoms and signs involving cognitive functions following cerebral infarction: Secondary | ICD-10-CM | POA: Diagnosis not present

## 2016-02-11 DIAGNOSIS — R41840 Attention and concentration deficit: Secondary | ICD-10-CM

## 2016-02-11 DIAGNOSIS — R278 Other lack of coordination: Secondary | ICD-10-CM | POA: Diagnosis not present

## 2016-02-11 DIAGNOSIS — R2689 Other abnormalities of gait and mobility: Secondary | ICD-10-CM | POA: Diagnosis not present

## 2016-02-11 DIAGNOSIS — M6281 Muscle weakness (generalized): Secondary | ICD-10-CM | POA: Diagnosis not present

## 2016-02-11 NOTE — Therapy (Signed)
[facility] [address] [place], [place], [address] Phone: [phone]   Fax:  [phone]  Occupational Therapy Treatment  Patient Details  Name: [name] MRN: [id number] Date of Birth: 04-09-1946 Referring Provider: Dr. Andrey Spearman  Encounter Date: 02/11/2016      OT End of Session - 02/11/16 1025    Visit Number 6   Number of Visits 9   Date for OT Re-Evaluation 02/17/16   Authorization Type [employer] MCR   Authorization Time Period No auth required, no visit limit; G code every 10 th visit   Authorization - Visit Number 6   Authorization - Number of Visits 10   OT Start Time 0820   OT Stop Time 0848   OT Time Calculation (min) 28 min   Activity Tolerance Patient tolerated treatment well      Past Medical History:  Diagnosis Date  . Allergic rhinitis   . Anxiety   . COPD (chronic obstructive pulmonary disease) ([place]) 05/23/2012  . Diverticulosis   . DJD (degenerative joint disease)   . Esophageal diverticulum   . Esophageal stricture   . GERD (gastroesophageal reflux disease)   . Hiatal hernia   . Hyperlipidemia 05/23/2012   T. Chol 234, LDL 130   . Hypertension   . IBS (irritable bowel syndrome)   . Osteopenia   . Traumatic arthritis     Past Surgical History:  Procedure Laterality Date  . CERVICAL SPINE SURGERY  2005  . LUNG BIOPSY Left    left side, not cancerous, thoracotomy  . VAGINAL HYSTERECTOMY      There were no vitals filed for this visit.      Subjective Assessment - 02/11/16 0826    Pertinent History neck surgery over 10 years ago   Patient Stated Goals Strengthen Lt side   Currently in Pain? No/denies                      OT Treatments/Exercises (OP) - 02/11/16 0001      ADLs   UB Dressing Practiced buttoning/unbuttoning 4 buttons without difficulty   Cooking Pt reports she has returned to cooking and remembering to turn stove/oven off with use of  timer.    ADL Comments Upgraded to red theraband for UE HEP - pt reports she does not need further review, but it has gotten easy with yellow theraband. Pt also reports she is doing putty HEP     Cognitive Exercises   Organization Organizing Your Day worksheet (#1) for organizational/problem solving skills in mod distracting gym: Pt was not following directions correctly and required mod cues to id mistakes. Pt unable to finish d/t time constraints (pt arrived on time, but was not arrived in computer until 8:17 am and then therapist was not paged, therefore, did not get pt until 8:20). Pt reported she had finished but had omitted several tasks and didn't start at appropriate time. Will finish for next time                     OT Long Term Goals - 02/11/16 0840      OT LONG TERM GOAL #1   Title Independent with HEP for LUE strengthening and grip strength (all goals due 02/17/16)    Time 4   Period Weeks   Status Achieved     OT LONG TERM GOAL #2   Title Grip strength Lt hand to be 42 lbs or greater  Baseline eval = 35 lbs   Time 4   Period Weeks   Status New     OT LONG TERM GOAL #3   Title Independent with hooking buttons and tying shoes   Time 4   Period Weeks   Status Partially Met  met with buttons, have not yet attempted tying shoes     OT LONG TERM GOAL #4   Title Pt to demo cooking full meal at mod I level safely   Time 4   Period Weeks   Status Achieved  per pt report - she is remembering to turn stove and oven off with timer     OT LONG TERM GOAL #5   Title Pt to perform organizational/problem solving tasks in mod distracting environment at 90% or greater accuracy   Time 4   Period Weeks   Status On-going     OT LONG TERM GOAL #6   Title Pt to verbalize understanding with and implement memory compensatory strategies   Time 4   Period Weeks   Status Achieved               Plan - 02/11/16 1025    Clinical Impression Statement Pt met LTG's  #1, #4, and #6. Pt partially met #3. Pt's biggest deficit at this time is cognition (executive functioning, memory).    OT Frequency 2x / week   OT Duration 4 weeks   OT Treatment/Interventions Self-care/ADL training;DME and/or AE instruction;Patient/family education;Therapeutic exercises;Therapeutic activities;Functional Mobility Training;Cognitive remediation/compensation;Visual/perceptual remediation/compensation;Manual Therapy;Neuromuscular education   Plan continue with organizational task (worksheet), memory worksheet, pursue getting speech referral from MD   Consulted and Agree with Plan of Care Patient      Patient will benefit from skilled therapeutic intervention in order to improve the following deficits and impairments:  Decreased endurance, Decreased safety awareness, Impaired sensation, Decreased activity tolerance, Impaired UE functional use, Decreased cognition, Decreased mobility, Decreased strength, Impaired vision/preception, Decreased balance  Visit Diagnosis: Other symptoms and signs involving cognitive functions following cerebral infarction  Attention and concentration deficit    Problem List Patient Active Problem List   Diagnosis Date Noted  . Urinary frequency 09/10/2015  . Dysphagia 02/24/2015  . Traumatic arthritis 02/24/2015  . Fever 02/24/2015  . Low back pain 11/11/2014  . Lower extremity edema 10/07/2014  . Chest pain 02/25/2014  . Chest pain at rest 06/18/2012  . Hypokalemia 06/18/2012  . Anemia 06/18/2012  . Leukopenia 06/18/2012  . COPD (chronic obstructive pulmonary disease) ([place]) 05/23/2012  . Osteopenia 05/23/2012  . Encounter for preventative adult health care exam with abnormal findings 05/17/2011  . THORACIC/LUMBOSACRAL NEURITIS/RADICULITIS UNSPEC 11/30/2009  . BACK PAIN 11/30/2009  . BACK PAIN WITH RADICULOPATHY 11/30/2009  . DEPRESSION/ANXIETY 06/01/2009  . MYOCARDIAL PERFUSION SCAN, WITH STRESS TEST, ABNORMAL 03/26/2009  . ALLERGIC  REACTION, ACUTE 09/05/2008  . PULMONARY NODULE 08/29/2008  . Early satiety 08/25/2008  . WEIGHT LOSS 08/21/2008  . GROIN PAIN 08/21/2008  . Nonspecific (abnormal) findings on radiological and other examination of body structure 08/21/2008  . Hyperlipidemia 12/25/2007  . ANXIETY 12/25/2007  . Essential hypertension 12/25/2007  . ALLERGIC RHINITIS 12/25/2007  . GERD 12/25/2007  . DIVERTICULOSIS, COLON 12/25/2007  . IBS 12/25/2007  . OSTEOPENIA 12/25/2007    Carey Bullocks, OTR/L 02/11/2016, 10:27 AM  [facility] [address] [place], [place], [address] Phone: [phone]   Fax:  [phone]  Name: [name] MRN: [id number] Date of Birth: [birth year]

## 2016-02-12 ENCOUNTER — Ambulatory Visit: Payer: Commercial Managed Care - HMO

## 2016-02-12 VITALS — BP 143/77 | HR 72

## 2016-02-12 DIAGNOSIS — R2689 Other abnormalities of gait and mobility: Secondary | ICD-10-CM | POA: Diagnosis not present

## 2016-02-12 DIAGNOSIS — M6281 Muscle weakness (generalized): Secondary | ICD-10-CM | POA: Diagnosis not present

## 2016-02-12 DIAGNOSIS — R278 Other lack of coordination: Secondary | ICD-10-CM | POA: Diagnosis not present

## 2016-02-12 DIAGNOSIS — I69318 Other symptoms and signs involving cognitive functions following cerebral infarction: Secondary | ICD-10-CM | POA: Diagnosis not present

## 2016-02-12 DIAGNOSIS — R41841 Cognitive communication deficit: Secondary | ICD-10-CM | POA: Diagnosis not present

## 2016-02-12 DIAGNOSIS — R41840 Attention and concentration deficit: Secondary | ICD-10-CM | POA: Diagnosis not present

## 2016-02-12 NOTE — Therapy (Addendum)
[facility] [address] [place], [place], [address] Phone: [phone]   Fax:  [phone]  Physical Therapy Treatment  Patient Details  Name: [name] MRN: [id number] Date of Birth: [birth year] Referring Provider: Dr. Leta Baptist  Encounter Date: 02/12/2016      PT End of Session - 02/12/16 1141    Visit Number 6   Number of Visits 9   Date for PT Re-Evaluation 02/18/16   Authorization Type [employer] Medicare-no Josem Kaufmann req per Lattie Haw. G-CODE AND PROGRESS NOTE EVERY 10TH VISIT.    PT Start Time [phone]   PT Stop Time 0930   PT Time Calculation (min) 41 min   Equipment Utilized During Treatment --  min guard to S prn   Activity Tolerance Patient tolerated treatment well   Behavior During Therapy [facility] for tasks assessed/performed      Past Medical History:  Diagnosis Date  . Allergic rhinitis   . Anxiety   . COPD (chronic obstructive pulmonary disease) ([place]) 05/23/2012  . Diverticulosis   . DJD (degenerative joint disease)   . Esophageal diverticulum   . Esophageal stricture   . GERD (gastroesophageal reflux disease)   . Hiatal hernia   . Hyperlipidemia 05/23/2012   T. Chol 234, LDL 130   . Hypertension   . IBS (irritable bowel syndrome)   . Osteopenia   . Traumatic arthritis     Past Surgical History:  Procedure Laterality Date  . CERVICAL SPINE SURGERY  2005  . LUNG BIOPSY Left    left side, not cancerous, thoracotomy  . VAGINAL HYSTERECTOMY      Vitals:   02/12/16 0911 02/12/16 0912  BP: (!) 164/83 (!) 143/77  Pulse: 100 72  SpO2: 92% 99%        Subjective Assessment - 02/12/16 0852    Subjective Pt denied falls or changes since last visit.    Pertinent History HTN, hyperlipidemia, osteopenia, anxiety, depression, COPD, IBS, traumatic arthritis   Patient Stated Goals Strengthen the L side    Currently in Pain? No/denies                  [facility] Adult PT Treatment/Exercise -  02/12/16 0901      Ambulation/Gait   Ambulation/Gait Yes   Ambulation/Gait Assistance 7: Independent   Ambulation/Gait Assistance Details No LOB but pt reported SOB after amb. Pt reported 6/10 SOB on the modified borg scale for dyspnea.   Ambulation Distance (Feet) 1100 Feet   Assistive device None   Gait Pattern Step-through pattern;Within Functional Limits   Ambulation Surface Level;Unlevel;Indoor;Outdoor;Paved;Gravel;Grass;Other (comment)  rubber mulch       Neuro re-ed; Pt performed balance HEP, in safe manner. PT decr. Frequency to 2x/week vs. Daily, as balance has improved. Please see pt instructions for details.      02/12/16 0854  Functional Gait  Assessment  Gait assessed  Yes  Gait Level Surface 2 (5.8sec.)  Change in Gait Speed 3  Gait with Horizontal Head Turns 3  Gait with Vertical Head Turns 3  Gait and Pivot Turn 3  Step Over Obstacle 3  Gait with Narrow Base of Support 3  Gait with Eyes Closed 3  Ambulating Backwards 3  Steps 3  Total Score 29  FGA comment: WNL  Data copied from flowsheets, as FGA did not automatically carry over to note.       PT Education - 02/12/16 1139    Education provided Yes   Education Details PT progressed  pt's balance HEP. PT discussed goal progress, and likely d/c from PT next week. Pt agreeable. PT also educated pt on the importance of monitoring BP, as it was elevated after amb. and pt was short of breath. PT educated pt to go to ED if she has elevated BP, along with MI/CVA symptoms and that PT would send note to MD.    Terence Lux) Educated Patient   Methods Explanation   Comprehension Verbalized understanding          PT Short Term Goals - 01/19/16 4680      PT SHORT TERM GOAL #1   Title same as LTGs           PT Long Term Goals - 02/12/16 1143      PT LONG TERM GOAL #1   Title Pt will be IND in HEP to improve strength and balance. TARGET DATE FOR ALL LTGS: 02/16/16   Status New     PT LONG TERM GOAL #2    Title Pt will improve FGA score to >/=27/30 to decr. falls risk.    Status Achieved     PT LONG TERM GOAL #3   Title Pt will ambulate 1000', IND, over even/uneven terrain to improve functional mobility.    Status Achieved     PT LONG TERM GOAL #4   Title Have pt complete FOTO and write goal.    Status New               Plan - 02/12/16 1142    Clinical Impression Statement Pt demonstrated progress, as she met LTGs 2 and 3. PT will finish assessing LTG 1 (HEP) next session and likely d/c pt. Pt's FGA score of 29/30 indicates pt is at low risk for falls. Pt's systolic BP was elevated after amb. and she experienced SOB, which decr. after 5 minute rest break. PT will send note to MD. Continue with POC.    Rehab Potential Good   Clinical Impairments Affecting Rehab Potential co-morbidities and orthostatic hypotension   PT Frequency 2x / week   PT Duration 4 weeks   PT Treatment/Interventions ADLs/Self Care Home Management;Biofeedback;Patient/family education;Orthotic Fit/Training;DME Instruction;Gait training;Stair training;Canalith Repostioning;Neuromuscular re-education;Balance training;Therapeutic exercise;Therapeutic activities;Functional mobility training;Manual techniques;Vestibular   PT Next Visit Plan Assess vitals. Check strengthening HEP and d/c if pt meets LTG 1.    Consulted and Agree with Plan of Care Patient      Patient will benefit from skilled therapeutic intervention in order to improve the following deficits and impairments:  Abnormal gait, Decreased endurance, Decreased knowledge of use of DME, Decreased strength, Decreased coordination, Decreased balance, Decreased mobility  Visit Diagnosis: Other abnormalities of gait and mobility  Other lack of coordination     Problem List Patient Active Problem List   Diagnosis Date Noted  . Urinary frequency 09/10/2015  . Dysphagia 02/24/2015  . Traumatic arthritis 02/24/2015  . Fever 02/24/2015  . Low back pain  11/11/2014  . Lower extremity edema 10/07/2014  . Chest pain 02/25/2014  . Chest pain at rest 06/18/2012  . Hypokalemia 06/18/2012  . Anemia 06/18/2012  . Leukopenia 06/18/2012  . COPD (chronic obstructive pulmonary disease) ([place]) 05/23/2012  . Osteopenia 05/23/2012  . Encounter for preventative adult health care exam with abnormal findings 05/17/2011  . THORACIC/LUMBOSACRAL NEURITIS/RADICULITIS UNSPEC 11/30/2009  . BACK PAIN 11/30/2009  . BACK PAIN WITH RADICULOPATHY 11/30/2009  . DEPRESSION/ANXIETY 06/01/2009  . MYOCARDIAL PERFUSION SCAN, WITH STRESS TEST, ABNORMAL 03/26/2009  . ALLERGIC REACTION, ACUTE 09/05/2008  . PULMONARY  NODULE 08/29/2008  . Early satiety 08/25/2008  . WEIGHT LOSS 08/21/2008  . GROIN PAIN 08/21/2008  . Nonspecific (abnormal) findings on radiological and other examination of body structure 08/21/2008  . Hyperlipidemia 12/25/2007  . ANXIETY 12/25/2007  . Essential hypertension 12/25/2007  . ALLERGIC RHINITIS 12/25/2007  . GERD 12/25/2007  . DIVERTICULOSIS, COLON 12/25/2007  . IBS 12/25/2007  . OSTEOPENIA 12/25/2007    Trevian Hayashida L 02/12/2016, 11:45 AM  [facility] [address] [place], [place], [address] Phone: [phone]   Fax:  [phone]  Name: [name] MRN: [id number] Date of Birth: 27-Jan-1946  Geoffry Paradise, PT,DPT 02/12/16 11:46 AM Phone: [phone] Fax: [phone]

## 2016-02-12 NOTE — Patient Instructions (Signed)
Along a carpeted hallway perform the following:  Carpeted Surface With Side to Side Head Motion    Perform without assistive device. Walking on carpet, turn head and eyes left for _3___ steps.  Then, turn head and eyes to opposite side for __3__ steps. Repeat sequence __4__ times/laps per session. Do __1-2__ sessions per week.  Copyright  VHI. All rights reserved.  Carpeted Surface With Up / Down Head Motion    Perform without assistive device. Walking on carpet, move head and eyes toward ceiling for __3__ steps.  Then, move head and eyes toward floor for __3__ steps. Repeat sequence __4__ times/laps per session. Do __1-2__ sessions per week.   Copyright  VHI. All rights reserved.  Feet Heel-Toe "Tandem"    Arms as needed for balance/at sides, walk a straight line bringing one foot directly in front of the other. Then walk backwards by placing one foot directly behind the other one.  Repeat for 3 laps each way per session. Do __1-2__ sessions per week.  Copyright  VHI. All rights reserved.   Perform the following in a corner with a chair in front of you: Feet Together (Compliant Surface) Varied Arm Positions - Eyes Closed    Stand on compliant surface: __pillows/cushion__ with feet together and arms as needed for balance. Close eyes and visualize upright position. Hold__30__ seconds. Repeat _3_ times per session. Do __1-2_ sessions per week.  Copyright  VHI. All rights reserved.   Feet Together (Compliant Surface) Head Motion - Eyes Closed    Stand on compliant surface: _pillows/cushion_ with feet together. Close eyes and move head slowly: 1. Up and down 2. Left and right 3. Diagonals both ways Repeat __10__ times each per session. Do _1-2___ sessions per week.  Copyright  VHI. All rights reserved.

## 2016-02-15 DIAGNOSIS — M25471 Effusion, right ankle: Secondary | ICD-10-CM | POA: Diagnosis not present

## 2016-02-15 DIAGNOSIS — M7989 Other specified soft tissue disorders: Secondary | ICD-10-CM | POA: Diagnosis not present

## 2016-02-15 DIAGNOSIS — M79671 Pain in right foot: Secondary | ICD-10-CM | POA: Diagnosis not present

## 2016-02-16 ENCOUNTER — Ambulatory Visit: Payer: Commercial Managed Care - HMO

## 2016-02-16 ENCOUNTER — Ambulatory Visit: Payer: Commercial Managed Care - HMO | Admitting: Occupational Therapy

## 2016-02-16 ENCOUNTER — Telehealth: Payer: Self-pay | Admitting: Occupational Therapy

## 2016-02-16 VITALS — BP 138/61 | HR 67

## 2016-02-16 DIAGNOSIS — R41840 Attention and concentration deficit: Secondary | ICD-10-CM

## 2016-02-16 DIAGNOSIS — M6281 Muscle weakness (generalized): Secondary | ICD-10-CM | POA: Diagnosis not present

## 2016-02-16 DIAGNOSIS — R278 Other lack of coordination: Secondary | ICD-10-CM

## 2016-02-16 DIAGNOSIS — R41841 Cognitive communication deficit: Secondary | ICD-10-CM | POA: Diagnosis not present

## 2016-02-16 DIAGNOSIS — I69318 Other symptoms and signs involving cognitive functions following cerebral infarction: Secondary | ICD-10-CM

## 2016-02-16 DIAGNOSIS — R2689 Other abnormalities of gait and mobility: Secondary | ICD-10-CM | POA: Diagnosis not present

## 2016-02-16 NOTE — Patient Instructions (Signed)

## 2016-02-16 NOTE — Therapy (Signed)
[facility] [address] [place], [place], [address] Phone: [phone]   Fax:  [phone]  Physical Therapy Treatment  Patient Details  Name: SKYLIE HIOTT MRN: [id number] Date of Birth: [birth year] Referring Provider: Dr. Leta Baptist  Encounter Date: 02/16/2016      PT End of Session - 02/16/16 1009    Visit Number 7   Number of Visits 9   Date for PT Re-Evaluation 02/18/16   Authorization Type [employer] Medicare-no Josem Kaufmann req per Lattie Haw. G-CODE AND PROGRESS NOTE EVERY 10TH VISIT.    PT Start Time 0932  with OT   PT Stop Time 1005  d/c   PT Time Calculation (min) 33 min   Activity Tolerance Patient tolerated treatment well   Behavior During Therapy WFL for tasks assessed/performed      Past Medical History:  Diagnosis Date  . Allergic rhinitis   . Anxiety   . COPD (chronic obstructive pulmonary disease) ([place]) 05/23/2012  . Diverticulosis   . DJD (degenerative joint disease)   . Esophageal diverticulum   . Esophageal stricture   . GERD (gastroesophageal reflux disease)   . Hiatal hernia   . Hyperlipidemia 05/23/2012   T. Chol 234, LDL 130   . Hypertension   . IBS (irritable bowel syndrome)   . Osteopenia   . Traumatic arthritis     Past Surgical History:  Procedure Laterality Date  . CERVICAL SPINE SURGERY  2005  . LUNG BIOPSY Left    left side, not cancerous, thoracotomy  . VAGINAL HYSTERECTOMY      Vitals:   02/16/16 0937  BP: 138/61  Pulse: 67        Subjective Assessment - 02/16/16 0935    Subjective Pt denied falls or changes since last visit. Pt was able to walk around quite a bit for [employer] homecoming and was a little tired but able to walk. Pt reported she went to MD yesterday for a steroid injection in L hip for pain.   Pertinent History HTN, hyperlipidemia, osteopenia, anxiety, depression, COPD, IBS, traumatic arthritis   Patient Stated Goals Strengthen the L side    Currently in  Pain? No/denies            Therex: Pt performed previous and progressed strengthening HEP. Cues for technique during new HEP exercises. Please see note for details. Performed with S for safety.                     PT Education - 02/16/16 1008    Education provided Yes   Education Details PT educated pt on progressed strengthening HEP and goal progress, d/c. Pt agreeable.   Person(s) Educated Patient   Methods Explanation;Demonstration;Verbal cues;Handout;Tactile cues   Comprehension Returned demonstration;Verbalized understanding          PT Short Term Goals - 01/19/16 0786      PT SHORT TERM GOAL #1   Title same as LTGs           PT Long Term Goals - 02/16/16 1010      PT LONG TERM GOAL #1   Title Pt will be IND in HEP to improve strength and balance. TARGET DATE FOR ALL LTGS: 02/16/16   Status Achieved     PT LONG TERM GOAL #2   Title Pt will improve FGA score to >/=27/30 to decr. falls risk.    Status Achieved     PT LONG TERM GOAL #3   Title Pt will ambulate  1000', IND, over even/uneven terrain to improve functional mobility.    Status Achieved     PT LONG TERM GOAL #4   Title Have pt complete FOTO and write goal.    Baseline FOTO not completed.   Status Deferred               Plan - March 01, 2016 1009    Clinical Impression Statement Pt met LTG 1, therefore, she has met all LTGs and is discharged due to excellent progress. Please see d/c summary for details.    Rehab Potential Good   Clinical Impairments Affecting Rehab Potential co-morbidities and orthostatic hypotension   PT Frequency 2x / week   PT Duration 4 weeks   PT Treatment/Interventions ADLs/Self Care Home Management;Biofeedback;Patient/family education;Orthotic Fit/Training;DME Instruction;Gait training;Stair training;Canalith Repostioning;Neuromuscular re-education;Balance training;Therapeutic exercise;Therapeutic activities;Functional mobility training;Manual  techniques;Vestibular   Consulted and Agree with Plan of Care Patient      Patient will benefit from skilled therapeutic intervention in order to improve the following deficits and impairments:  Abnormal gait, Decreased endurance, Decreased knowledge of use of DME, Decreased strength, Decreased coordination, Decreased balance, Decreased mobility  Visit Diagnosis: Other abnormalities of gait and mobility  Other lack of coordination  Muscle weakness (generalized)       G-Codes - 03-01-16 1010    Functional Assessment Tool Used FGA: 29/30   Functional Limitation Mobility: Walking and moving around   Mobility: Walking and Moving Around Goal Status [phone]) At least 1 percent but less than 20 percent impaired, limited or restricted   Mobility: Walking and Moving Around Discharge Status [phone]) At least 1 percent but less than 20 percent impaired, limited or restricted      Problem List Patient Active Problem List   Diagnosis Date Noted  . Urinary frequency 09/10/2015  . Dysphagia 02/24/2015  . Traumatic arthritis 02/24/2015  . Fever 02/24/2015  . Low back pain 11/11/2014  . Lower extremity edema 10/07/2014  . Chest pain 02/25/2014  . Chest pain at rest 06/18/2012  . Hypokalemia 06/18/2012  . Anemia 06/18/2012  . Leukopenia 06/18/2012  . COPD (chronic obstructive pulmonary disease) ([place]) 05/23/2012  . Osteopenia 05/23/2012  . Encounter for preventative adult health care exam with abnormal findings 05/17/2011  . THORACIC/LUMBOSACRAL NEURITIS/RADICULITIS UNSPEC 11/30/2009  . BACK PAIN 11/30/2009  . BACK PAIN WITH RADICULOPATHY 11/30/2009  . DEPRESSION/ANXIETY 06/01/2009  . MYOCARDIAL PERFUSION SCAN, WITH STRESS TEST, ABNORMAL 03/26/2009  . ALLERGIC REACTION, ACUTE 09/05/2008  . PULMONARY NODULE 08/29/2008  . Early satiety 08/25/2008  . WEIGHT LOSS 08/21/2008  . GROIN PAIN 08/21/2008  . Nonspecific (abnormal) findings on radiological and other examination of body structure  08/21/2008  . Hyperlipidemia 12/25/2007  . ANXIETY 12/25/2007  . Essential hypertension 12/25/2007  . ALLERGIC RHINITIS 12/25/2007  . GERD 12/25/2007  . DIVERTICULOSIS, COLON 12/25/2007  . IBS 12/25/2007  . OSTEOPENIA 12/25/2007    Kaylee Wombles L 03/01/16, 10:12 AM  [facility] [address] [place], [place], [address] Phone: [phone]   Fax:  [phone]  Name: SHAKENA CALLARI MRN: [id number] Date of Birth: 05/31/1945  PHYSICAL THERAPY DISCHARGE SUMMARY  Visits from Start of Care: 7  Current functional level related to goals / functional outcomes:     PT Long Term Goals - 03-01-2016 1010      PT LONG TERM GOAL #1   Title Pt will be IND in HEP to improve strength and balance. TARGET DATE FOR ALL LTGS: 2016/03/01   Status Achieved  PT LONG TERM GOAL #2   Title Pt will improve FGA score to >/=27/30 to decr. falls risk.    Status Achieved     PT LONG TERM GOAL #3   Title Pt will ambulate 1000', IND, over even/uneven terrain to improve functional mobility.    Status Achieved     PT LONG TERM GOAL #4   Title Have pt complete FOTO and write goal.    Baseline FOTO not completed.   Status Deferred        Remaining deficits: Decreased endurance and elevated BP after amb. Longer distances, PT encouraged pt to f/u with MD and PT sent last note to Dr. Leta Baptist.   Education / Equipment: HEP  Plan: Patient agrees to discharge.  Patient goals were met. Patient is being discharged due to meeting the stated rehab goals.  ?????        Geoffry Paradise, PT,DPT 02/16/16 10:13 AM Phone: [phone] Fax: [phone]

## 2016-02-16 NOTE — Therapy (Signed)
[facility] [address] [place], [place], [address] Phone: [phone]   Fax:  [phone]  Occupational Therapy Treatment  Patient Details  Name: [name] MRN: [id number] Date of Birth: [birth year] Referring Provider: Dr. Andrey Spearman  Encounter Date: 02/16/2016      OT End of Session - 02/16/16 0928    Visit Number 7   Number of Visits 9   Date for OT Re-Evaluation 02/17/16   Authorization Type [employer] MCR   Authorization Time Period No auth required, no visit limit; G code every 10 th visit   Authorization - Visit Number 7   Authorization - Number of Visits 10   OT Start Time 0845   OT Stop Time 0930   OT Time Calculation (min) 45 min   Activity Tolerance Patient tolerated treatment well      Past Medical History:  Diagnosis Date  . Allergic rhinitis   . Anxiety   . COPD (chronic obstructive pulmonary disease) ([place]) 05/23/2012  . Diverticulosis   . DJD (degenerative joint disease)   . Esophageal diverticulum   . Esophageal stricture   . GERD (gastroesophageal reflux disease)   . Hiatal hernia   . Hyperlipidemia 05/23/2012   T. Chol 234, LDL 130   . Hypertension   . IBS (irritable bowel syndrome)   . Osteopenia   . Traumatic arthritis     Past Surgical History:  Procedure Laterality Date  . CERVICAL SPINE SURGERY  2005  . LUNG BIOPSY Left    left side, not cancerous, thoracotomy  . VAGINAL HYSTERECTOMY      There were no vitals filed for this visit.      Subjective Assessment - 02/16/16 0853    Pertinent History neck surgery over 10 years ago   Patient Stated Goals Strengthen Lt side   Currently in Pain? No/denies                      OT Treatments/Exercises (OP) - 02/16/16 0001      ADLs   ADL Comments Discussed pursuing speech therapy referral for higher level cognitive deficits - pt agreeable. Pt issued memory compensatory strategies and how to implement at  home     Cognitive Exercises   Organization Continued working on Organizing Your Day worksheet #1 with mod cueing at beginning to correct mistakes from last time and review instructions. Pt then worked on task, but duplicated one errand x2 and omitted 3 errands. Pt then required mod v.c's to problem solve putting the remaining errands at correct times that make sense/more efficient     Hand Exercises   Other Hand Exercises Gripper position 2 to pick up 1 in blocks w/ left hand and release into bowl. Min difficulty as pt begins to fatigue, dropping 2 blocks LUE.                 OT Education - 02/16/16 0919    Education provided Yes   Education Details memory compensatory strategies   Person(s) Educated Patient   Methods Explanation;Handout   Comprehension Verbalized understanding             OT Long Term Goals - 02/11/16 0840      OT LONG TERM GOAL #1   Title Independent with HEP for LUE strengthening and grip strength (all goals due 02/17/16)    Time 4   Period Weeks   Status Achieved     OT LONG TERM GOAL #2  Title Grip strength Lt hand to be 42 lbs or greater   Baseline eval = 35 lbs   Time 4   Period Weeks   Status New     OT LONG TERM GOAL #3   Title Independent with hooking buttons and tying shoes   Time 4   Period Weeks   Status Partially Met  met with buttons, have not yet attempted tying shoes     OT LONG TERM GOAL #4   Title Pt to demo cooking full meal at mod I level safely   Time 4   Period Weeks   Status Achieved  per pt report - she is remembering to turn stove and oven off with timer     OT LONG TERM GOAL #5   Title Pt to perform organizational/problem solving tasks in mod distracting environment at 90% or greater accuracy   Time 4   Period Weeks   Status On-going     OT LONG TERM GOAL #6   Title Pt to verbalize understanding with and implement memory compensatory strategies   Time 4   Period Weeks   Status Achieved                Plan - 02/16/16 5465    Clinical Impression Statement Pt progressing towards remaining goals. Pt demo higher level cognitive deficits.    Rehab Potential Good   OT Frequency 2x / week   OT Duration 4 weeks   OT Treatment/Interventions Self-care/ADL training;DME and/or AE instruction;Patient/family education;Therapeutic exercises;Therapeutic activities;Functional Mobility Training;Cognitive remediation/compensation;Visual/perceptual remediation/compensation;Manual Therapy;Neuromuscular education   Plan Review theraband HEP, practice tying shoes, problem solving tasks, check on speech referral, begin assessing remaining goals in prep for d/c    Consulted and Agree with Plan of Care Patient      Patient will benefit from skilled therapeutic intervention in order to improve the following deficits and impairments:  Decreased endurance, Decreased safety awareness, Impaired sensation, Decreased activity tolerance, Impaired UE functional use, Decreased cognition, Decreased mobility, Decreased strength, Impaired vision/preception, Decreased balance  Visit Diagnosis: Other symptoms and signs involving cognitive functions following cerebral infarction  Attention and concentration deficit  Muscle weakness (generalized)    Problem List Patient Active Problem List   Diagnosis Date Noted  . Urinary frequency 09/10/2015  . Dysphagia 02/24/2015  . Traumatic arthritis 02/24/2015  . Fever 02/24/2015  . Low back pain 11/11/2014  . Lower extremity edema 10/07/2014  . Chest pain 02/25/2014  . Chest pain at rest 06/18/2012  . Hypokalemia 06/18/2012  . Anemia 06/18/2012  . Leukopenia 06/18/2012  . COPD (chronic obstructive pulmonary disease) ([place]) 05/23/2012  . Osteopenia 05/23/2012  . Encounter for preventative adult health care exam with abnormal findings 05/17/2011  . THORACIC/LUMBOSACRAL NEURITIS/RADICULITIS UNSPEC 11/30/2009  . BACK PAIN 11/30/2009  . BACK PAIN WITH  RADICULOPATHY 11/30/2009  . DEPRESSION/ANXIETY 06/01/2009  . MYOCARDIAL PERFUSION SCAN, WITH STRESS TEST, ABNORMAL 03/26/2009  . ALLERGIC REACTION, ACUTE 09/05/2008  . PULMONARY NODULE 08/29/2008  . Early satiety 08/25/2008  . WEIGHT LOSS 08/21/2008  . GROIN PAIN 08/21/2008  . Nonspecific (abnormal) findings on radiological and other examination of body structure 08/21/2008  . Hyperlipidemia 12/25/2007  . ANXIETY 12/25/2007  . Essential hypertension 12/25/2007  . ALLERGIC RHINITIS 12/25/2007  . GERD 12/25/2007  . DIVERTICULOSIS, COLON 12/25/2007  . IBS 12/25/2007  . OSTEOPENIA 12/25/2007    Carey Bullocks, OTR/L 02/16/2016, 9:30 AM  [facility] [address] [place], [place], [address] Phone:  [phone]   Fax:  [phone]  Name: [name] MRN: [id number] Date of Birth: [birth year]

## 2016-02-16 NOTE — Telephone Encounter (Signed)
Please send referral for speech therapy services to address higher level cognitive deficits. Thank you Redmond Baseman, OTR/L

## 2016-02-16 NOTE — Patient Instructions (Addendum)
Bridge    Lie back, legs bent. Arms across chest. Lift hips up and hold for 5 seconds. Slowly lower hips back down. Repeat __10__ times. Do __3-4__ sessions per week (every other day). Progress by increasing reps from 10 reps to 15 reps, as tolerated.  http://pm.exer.us/55   Copyright  VHI. All rights reserved.       Clam Shells  While lying on your side with your knees bent, red band around legs just above knees, draw up the top knee while keeping contact of your feet together. Hold for 1-2 seconds.  Do not let your pelvis roll back during the lifting movement.  Perform 2 sets of 10 reps, 3-4 times per week.  Progress by increasing to 3 sets of 10 reps using red band. When 3 sets of 10 reps becomes easy, use green band vs. The red band.    HIP / KNEE: Extension - Standing (Band)    Hold something sturdy to assist with balance: Place red band around both legs. Squeeze glutes. Raise and lift leg backward and then slowly back in. Keep knee straight. Repeat with other leg (alternate legs). _10_ reps per set, _2_ sets per day, 3-4 times a week. Progression: perform 3 sets of 10 reps with red band. When this is easy, use green band.  Copyright  VHI. All rights reserved.   Mini Squat: Double Leg    With feet shoulder width apart, reach forward for balance and do a mini squat. Keep knees in line with second toe. Knees do not go past toes. Repeat _5__ times per set. Rest __5_ seconds after set. Do _1__ sets per session. Perform 3-4 times a week.  http://plyo.exer.us/70   Copyright  VHI. All rights reserved.

## 2016-02-18 ENCOUNTER — Ambulatory Visit: Payer: Commercial Managed Care - HMO | Admitting: Occupational Therapy

## 2016-02-18 ENCOUNTER — Ambulatory Visit: Payer: Commercial Managed Care - HMO

## 2016-02-18 DIAGNOSIS — M6281 Muscle weakness (generalized): Secondary | ICD-10-CM

## 2016-02-18 DIAGNOSIS — R41840 Attention and concentration deficit: Secondary | ICD-10-CM

## 2016-02-18 DIAGNOSIS — R41841 Cognitive communication deficit: Secondary | ICD-10-CM | POA: Diagnosis not present

## 2016-02-18 DIAGNOSIS — R2689 Other abnormalities of gait and mobility: Secondary | ICD-10-CM | POA: Diagnosis not present

## 2016-02-18 DIAGNOSIS — I69318 Other symptoms and signs involving cognitive functions following cerebral infarction: Secondary | ICD-10-CM

## 2016-02-18 DIAGNOSIS — R278 Other lack of coordination: Secondary | ICD-10-CM

## 2016-02-18 NOTE — Therapy (Signed)
[facility] [address] [place], [place], [address] Phone: [phone]   Fax:  [phone]  Occupational Therapy Treatment  Patient Details  Name: [name] MRN: [id number] Date of Birth: 08/30/1945 Referring Provider: Dr. Vikram Penumalli  Encounter Date: 02/18/2016      OT End of Session - 02/18/16 0940    Visit Number 8   Number of Visits 9   Date for OT Re-Evaluation 02/17/16   Authorization Type [employer] MCR   Authorization Time Period No auth required, no visit limit; G code every 10 th visit   Authorization - Visit Number 8   Authorization - Number of Visits 10   OT Start Time 0935   OT Stop Time 1015   OT Time Calculation (min) 40 min   Activity Tolerance Patient tolerated treatment well   Behavior During Therapy WFL for tasks assessed/performed      Past Medical History:  Diagnosis Date  . Allergic rhinitis   . Anxiety   . COPD (chronic obstructive pulmonary disease) (HCC) 05/23/2012  . Diverticulosis   . DJD (degenerative joint disease)   . Esophageal diverticulum   . Esophageal stricture   . GERD (gastroesophageal reflux disease)   . Hiatal hernia   . Hyperlipidemia 05/23/2012   T. Chol 234, LDL 130   . Hypertension   . IBS (irritable bowel syndrome)   . Osteopenia   . Traumatic arthritis     Past Surgical History:  Procedure Laterality Date  . CERVICAL SPINE SURGERY  2005  . LUNG BIOPSY Left    left side, not cancerous, thoracotomy  . VAGINAL HYSTERECTOMY      There were no vitals filed for this visit.      Subjective Assessment - 02/18/16 0937    Subjective  "I'm fine"   Pertinent History neck surgery over 10 years ago   Patient Stated Goals Strengthen Lt side   Currently in Pain? No/denies       Practiced donning/doffing shoes and tying.  Pt able to don/doff and tie both shoes with incr time/effort.  Reviewed yellow theraband HEP x2 sets of 10 each.  Picking up  blocks with gripper set on position 2 with black spring with mod difficulty/drops for incr sustained grip strength.    Began assessing remaining goals (see goal section below).  Functional problem solving word problems with multiple errors for problem solving/set-up and attention to details.  However math completed math correctly.                              OT Long Term Goals - 02/18/16 0951      OT LONG TERM GOAL #1   Title Independent with HEP for LUE strengthening and grip strength (all goals due 02/17/16)    Time 4   Period Weeks   Status Achieved     OT LONG TERM GOAL #2   Title Grip strength Lt hand to be 42 lbs or greater   Baseline eval = 35 lbs   Time 4   Period Weeks   Status Achieved  02/18/16:  51lbs     OT LONG TERM GOAL #3   Title Independent with hooking buttons and tying shoes   Time 4   Period Weeks   Status Achieved  met with buttons, have not yet attempted tying shoes.  02/18/16:  met with tying shoes     OT LONG TERM GOAL #  4   Title Pt to demo cooking full meal at mod I level safely   Time 4   Period Weeks   Status Achieved  per pt report - she is remembering to turn stove and oven off with timer     OT LONG TERM GOAL #5   Title Pt to perform organizational/problem solving tasks in mod distracting environment at 90% or greater accuracy   Time 4   Period Weeks   Status On-going     OT LONG TERM GOAL #6   Title Pt to verbalize understanding with and implement memory compensatory strategies   Time 4   Period Weeks   Status Achieved               Plan - 02/18/16 0940    Clinical Impression Statement Pt has met LTG #3 with incr time/effort and met LTG #2 as well.  Pt making good progress.     Rehab Potential Good   OT Frequency 2x / week   OT Duration 4 weeks   OT Treatment/Interventions Self-care/ADL training;DME and/or AE instruction;Patient/family education;Therapeutic exercises;Therapeutic  activities;Functional Mobility Training;Cognitive remediation/compensation;Visual/perceptual remediation/compensation;Manual Therapy;Neuromuscular education   Plan check remaining goals and anticipate d/c, check on speech referral   Consulted and Agree with Plan of Care Patient      Patient will benefit from skilled therapeutic intervention in order to improve the following deficits and impairments:  Decreased endurance, Decreased safety awareness, Impaired sensation, Decreased activity tolerance, Impaired UE functional use, Decreased cognition, Decreased mobility, Decreased strength, Impaired vision/preception, Decreased balance  Visit Diagnosis: Muscle weakness (generalized)  Other symptoms and signs involving cognitive functions following cerebral infarction  Attention and concentration deficit  Other lack of coordination    Problem List Patient Active Problem List   Diagnosis Date Noted  . Urinary frequency 09/10/2015  . Dysphagia 02/24/2015  . Traumatic arthritis 02/24/2015  . Fever 02/24/2015  . Low back pain 11/11/2014  . Lower extremity edema 10/07/2014  . Chest pain 02/25/2014  . Chest pain at rest 06/18/2012  . Hypokalemia 06/18/2012  . Anemia 06/18/2012  . Leukopenia 06/18/2012  . COPD (chronic obstructive pulmonary disease) (HCC) 05/23/2012  . Osteopenia 05/23/2012  . Encounter for preventative adult health care exam with abnormal findings 05/17/2011  . THORACIC/LUMBOSACRAL NEURITIS/RADICULITIS UNSPEC 11/30/2009  . BACK PAIN 11/30/2009  . BACK PAIN WITH RADICULOPATHY 11/30/2009  . DEPRESSION/ANXIETY 06/01/2009  . MYOCARDIAL PERFUSION SCAN, WITH STRESS TEST, ABNORMAL 03/26/2009  . ALLERGIC REACTION, ACUTE 09/05/2008  . PULMONARY NODULE 08/29/2008  . Early satiety 08/25/2008  . WEIGHT LOSS 08/21/2008  . GROIN PAIN 08/21/2008  . Nonspecific (abnormal) findings on radiological and other examination of body structure 08/21/2008  . Hyperlipidemia 12/25/2007  .  ANXIETY 12/25/2007  . Essential hypertension 12/25/2007  . ALLERGIC RHINITIS 12/25/2007  . GERD 12/25/2007  . DIVERTICULOSIS, COLON 12/25/2007  . IBS 12/25/2007  . OSTEOPENIA 12/25/2007    FREEMAN,ANGELA 02/18/2016, 9:54 AM  [facility] [address] [place], [place], [address] Phone: [phone]   Fax:  [phone]  Name: Octavie J Ardizzone MRN: [id number] Date of Birth: 10/07/1945   Angela Freeman, OTR/L  [facility] [address] [place], [place]  [address] [phone] phone [phone] 02/18/16 9:54 AM     

## 2016-02-23 ENCOUNTER — Ambulatory Visit: Payer: Commercial Managed Care - HMO | Admitting: Occupational Therapy

## 2016-02-23 ENCOUNTER — Ambulatory Visit: Payer: Commercial Managed Care - HMO

## 2016-02-23 ENCOUNTER — Telehealth: Payer: Self-pay | Admitting: Diagnostic Neuroimaging

## 2016-02-23 DIAGNOSIS — R278 Other lack of coordination: Secondary | ICD-10-CM | POA: Diagnosis not present

## 2016-02-23 DIAGNOSIS — I69318 Other symptoms and signs involving cognitive functions following cerebral infarction: Secondary | ICD-10-CM

## 2016-02-23 DIAGNOSIS — R41840 Attention and concentration deficit: Secondary | ICD-10-CM

## 2016-02-23 DIAGNOSIS — M6281 Muscle weakness (generalized): Secondary | ICD-10-CM | POA: Diagnosis not present

## 2016-02-23 DIAGNOSIS — R2689 Other abnormalities of gait and mobility: Secondary | ICD-10-CM | POA: Diagnosis not present

## 2016-02-23 DIAGNOSIS — R41841 Cognitive communication deficit: Secondary | ICD-10-CM | POA: Diagnosis not present

## 2016-02-23 NOTE — Telephone Encounter (Signed)
Form is on Dr [employer] desk for signature.

## 2016-02-23 NOTE — Therapy (Signed)
[name] [address] [name], [place], [address] Phone: [phone]   Fax:  [phone]  Occupational Therapy Treatment  Patient Details  Name: [name] MRN: [id number] Date of Birth: [birth year] Referring Provider: Dr. Andrey Spearman  Encounter Date: 02/23/2016      OT End of Session - 02/23/16 0845    Visit Number 9   Number of Visits 9   Date for OT Re-Evaluation 02/17/16   Authorization Type [employer] MCR   Authorization Time Period No auth required, no visit limit; G code every 10 th visit   Authorization - Visit Number 9   Authorization - Number of Visits 10   OT Start Time 0805   OT Stop Time 0845   OT Time Calculation (min) 40 min   Activity Tolerance Patient tolerated treatment well      Past Medical History:  Diagnosis Date  . Allergic rhinitis   . Anxiety   . COPD (chronic obstructive pulmonary disease) ([place]) 05/23/2012  . Diverticulosis   . DJD (degenerative joint disease)   . Esophageal diverticulum   . Esophageal stricture   . GERD (gastroesophageal reflux disease)   . Hiatal hernia   . Hyperlipidemia 05/23/2012   T. Chol 234, LDL 130   . Hypertension   . IBS (irritable bowel syndrome)   . Osteopenia   . Traumatic arthritis     Past Surgical History:  Procedure Laterality Date  . CERVICAL SPINE SURGERY  2005  . LUNG BIOPSY Left    left side, not cancerous, thoracotomy  . VAGINAL HYSTERECTOMY      There were no vitals filed for this visit.      Subjective Assessment - 02/23/16 0813    Subjective  I feel good about d/c   Pertinent History neck surgery over 10 years ago   Currently in Pain? No/denies                      OT Treatments/Exercises (OP) - 02/23/16 0001      ADLs   ADL Comments Speech referral still not received. Pt given paper referral to send to referring neurologist next door. Discussed/reviewed POC and memory strategies for cooking,  medication management. Discussed d/c from O.T. today - pt agrees     Cognitive Exercises   Problem Solving Pt reading existing schedule and had to place other "to do" errands in appropriate opening and then answer corresponding questions in mod distracting evn't: Pt with lack of attention to detail and making 2 big errors d/t lack of attn to detail and not following directions thoroughly. Pt also left 2 questions completely blank. Pt asked to go back and correct schedule, then review questions after changes made to schedule to adjust answers prn. Pt answered 2 questions incorrectly and took extra time to complete task.                      OT Long Term Goals - 02/23/16 0846      OT LONG TERM GOAL #1   Title Independent with HEP for LUE strengthening and grip strength (all goals due 02/17/16)    Time 4   Period Weeks   Status Achieved     OT LONG TERM GOAL #2   Title Grip strength Lt hand to be 42 lbs or greater   Baseline eval = 35 lbs   Time 4   Period Weeks   Status Achieved  02/18/16:  51lbs     OT LONG TERM GOAL #3   Title Independent with hooking buttons and tying shoes   Time 4   Period Weeks   Status Achieved  met with buttons, have not yet attempted tying shoes.  02/18/16:  met with tying shoes     OT LONG TERM GOAL #4   Title Pt to demo cooking full meal at mod I level safely   Time 4   Period Weeks   Status Achieved  per pt report - she is remembering to turn stove and oven off with timer     OT LONG TERM GOAL #5   Title Pt to perform organizational/problem solving tasks in mod distracting environment at 90% or greater accuracy   Time 4   Period Weeks   Status Not Met  Pt still requires mod cueing to id/correct mistakes secondary to lack of attention to detail and executive functioning deficits     OT LONG TERM GOAL #6   Title Pt to verbalize understanding with and implement memory compensatory strategies   Time 4   Period Weeks   Status Achieved                Plan - 03-10-16 0846    Clinical Impression Statement Pt has met all LTG's except #5 due to decr. attention to detail and executive functioning deficits. Have sent paper referral to referring neurologist for speech therapy to address these deficits (also sent in EPIC).    Plan D/C O.T. 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 Urinary frequency 09/10/2015  . Dysphagia 02/24/2015  . Traumatic arthritis 02/24/2015  . Fever 02/24/2015  . Low back pain 11/11/2014  . Lower extremity edema 10/07/2014  . Chest pain 02/25/2014  . Chest pain at rest 06/18/2012  . Hypokalemia 06/18/2012  . Anemia 06/18/2012  . Leukopenia 06/18/2012  . COPD (chronic obstructive pulmonary disease) ([place]) 05/23/2012  . Osteopenia 05/23/2012  . Encounter for preventative adult health care exam with abnormal findings 05/17/2011  . THORACIC/LUMBOSACRAL NEURITIS/RADICULITIS UNSPEC 11/30/2009  . BACK PAIN 11/30/2009  . BACK PAIN WITH RADICULOPATHY 11/30/2009  . DEPRESSION/ANXIETY 06/01/2009  . MYOCARDIAL PERFUSION SCAN, WITH STRESS TEST, ABNORMAL 03/26/2009  .  ALLERGIC REACTION, ACUTE 09/05/2008  . PULMONARY NODULE 08/29/2008  . Early satiety 08/25/2008  . WEIGHT LOSS 08/21/2008  . GROIN PAIN 08/21/2008  . Nonspecific (abnormal) findings on radiological and other examination of body structure 08/21/2008  . Hyperlipidemia 12/25/2007  . ANXIETY 12/25/2007  . Essential hypertension 12/25/2007  . ALLERGIC RHINITIS 12/25/2007  . GERD 12/25/2007  . DIVERTICULOSIS, COLON 12/25/2007  . IBS 12/25/2007  .  OSTEOPENIA 12/25/2007    Carey Bullocks, OTR/L 02/23/2016, 10:07 AM  [facility] [address] [place], [place], [address] Phone: [phone]   Fax:  [phone]  Name: [name] MRN: [id number] Date of Birth: [birth year]

## 2016-02-23 NOTE — Telephone Encounter (Signed)
Patient dropped of a form that needs Dr. Gladstone Lighter signature for speech therapy. Best call back is [phone]

## 2016-02-23 NOTE — Telephone Encounter (Signed)
Form is signed, spoke with patient who requested it be taken to [facility]. Delivered signed order for ST to [place] at front desk, [facility].

## 2016-02-25 ENCOUNTER — Encounter: Payer: Self-pay | Admitting: Occupational Therapy

## 2016-02-25 ENCOUNTER — Ambulatory Visit: Payer: Commercial Managed Care - HMO

## 2016-02-25 ENCOUNTER — Ambulatory Visit: Payer: Commercial Managed Care - HMO | Admitting: Occupational Therapy

## 2016-02-25 NOTE — Therapy (Signed)
[facility] [address] [place], [place], [address] Phone: [phone]   Fax:  [phone]  Patient Details  Name: [name] MRN: [id number] Date of Birth: [birth year] Referring Provider:  Dr. Andrey Spearman  Encounter Date: 02/25/2016  OCCUPATIONAL THERAPY DISCHARGE SUMMARY  Visits from Start of Care: 9  Current functional level related to goals / functional outcomes:     OT Long Term Goals - 02/23/16 0846      OT LONG TERM GOAL #1   Title Independent with HEP for LUE strengthening and grip strength (all goals due 02/17/16)    Time 4   Period Weeks   Status Achieved     OT LONG TERM GOAL #2   Title Grip strength Lt hand to be 42 lbs or greater   Baseline eval = 35 lbs   Time 4   Period Weeks   Status Achieved  02/18/16:  51lbs     OT LONG TERM GOAL #3   Title Independent with hooking buttons and tying shoes   Time 4   Period Weeks   Status Achieved  met with buttons, have not yet attempted tying shoes.  02/18/16:  met with tying shoes     OT LONG TERM GOAL #4   Title Pt to demo cooking full meal at mod I level safely   Time 4   Period Weeks   Status Achieved  per pt report - she is remembering to turn stove and oven off with timer     OT LONG TERM GOAL #5   Title Pt to perform organizational/problem solving tasks in mod distracting environment at 90% or greater accuracy   Time 4   Period Weeks   Status Not Met  Pt still requires mod cueing to id/correct mistakes secondary to lack of attention to detail and executive functioning deficits     OT LONG TERM GOAL #6   Title Pt to verbalize understanding with and implement memory compensatory strategies   Time 4   Period Weeks   Status Achieved        Remaining deficits: Higher level cognitive deficits including attention to detail, organization, and problem solving    Education / Equipment: HEP, memory compensatory strategies     Plan: Patient agrees to discharge.  Patient goals were met. Patient is being discharged due to meeting the stated rehab goals.  ?????        Carey Bullocks, OTR/L 02/25/2016, 8:41 AM  [facility] [address] [place], [place], [address] Phone: [phone]   Fax:  [phone]

## 2016-02-26 ENCOUNTER — Ambulatory Visit: Payer: Commercial Managed Care - HMO | Admitting: Speech Pathology

## 2016-02-26 DIAGNOSIS — R2689 Other abnormalities of gait and mobility: Secondary | ICD-10-CM | POA: Diagnosis not present

## 2016-02-26 DIAGNOSIS — I69318 Other symptoms and signs involving cognitive functions following cerebral infarction: Secondary | ICD-10-CM | POA: Diagnosis not present

## 2016-02-26 DIAGNOSIS — M6281 Muscle weakness (generalized): Secondary | ICD-10-CM | POA: Diagnosis not present

## 2016-02-26 DIAGNOSIS — R278 Other lack of coordination: Secondary | ICD-10-CM | POA: Diagnosis not present

## 2016-02-26 DIAGNOSIS — R41841 Cognitive communication deficit: Secondary | ICD-10-CM

## 2016-02-26 DIAGNOSIS — R41840 Attention and concentration deficit: Secondary | ICD-10-CM | POA: Diagnosis not present
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
## 2016-02-26 NOTE — Patient Instructions (Addendum)
    Cognitive Activities you can do at home:   - [name] (easy level)  - [name]  On your computer, tablet or phone: [email or web address] Estate manager/land agent App   Attention to details Alternating attention Multi tasking Organizing, reasoning, problem solving

## 2016-03-03 NOTE — Addendum Note (Signed)
Addended byAndrey Spearman on: 03/03/2016 10:13 PM   Modules accepted: Orders

## 2016-03-08 ENCOUNTER — Inpatient Hospital Stay: Admission: RE | Admit: 2016-03-08 | Payer: Commercial Managed Care - HMO | Source: Ambulatory Visit

## 2016-03-09 DIAGNOSIS — I10 Essential (primary) hypertension: Secondary | ICD-10-CM | POA: Diagnosis not present

## 2016-03-09 DIAGNOSIS — E782 Mixed hyperlipidemia: Secondary | ICD-10-CM | POA: Diagnosis not present

## 2016-03-15 ENCOUNTER — Ambulatory Visit: Payer: Commercial Managed Care - HMO | Attending: Orthopedic Surgery | Admitting: Speech Pathology

## 2016-03-15 DIAGNOSIS — R41841 Cognitive communication deficit: Secondary | ICD-10-CM | POA: Insufficient documentation

## 2016-03-15 DIAGNOSIS — R41840 Attention and concentration deficit: Secondary | ICD-10-CM | POA: Insufficient documentation

## 2016-03-16 DIAGNOSIS — I10 Essential (primary) hypertension: Secondary | ICD-10-CM | POA: Diagnosis not present

## 2016-03-16 DIAGNOSIS — E782 Mixed hyperlipidemia: Secondary | ICD-10-CM | POA: Diagnosis not present

## 2016-03-16 DIAGNOSIS — J069 Acute upper respiratory infection, unspecified: Secondary | ICD-10-CM | POA: Diagnosis not present

## 2016-03-17 ENCOUNTER — Ambulatory Visit: Payer: Commercial Managed Care - HMO | Admitting: *Deleted

## 2016-03-22 ENCOUNTER — Ambulatory Visit: Payer: Commercial Managed Care - HMO | Admitting: Speech Pathology

## 2016-03-23 ENCOUNTER — Ambulatory Visit
Admission: RE | Admit: 2016-03-23 | Discharge: 2016-03-23 | Disposition: A | Discharge status: Home / Self Care | Payer: Commercial Managed Care - HMO | Source: Ambulatory Visit | Attending: Diagnostic Neuroimaging | Admitting: Diagnostic Neuroimaging

## 2016-03-23 DIAGNOSIS — I619 Nontraumatic intracerebral hemorrhage, unspecified: Secondary | ICD-10-CM | POA: Diagnosis not present

## 2016-03-23 DIAGNOSIS — I610 Nontraumatic intracerebral hemorrhage in hemisphere, subcortical: Secondary | ICD-10-CM

## 2016-03-23 MED ORDER — GADOBENATE DIMEGLUMINE 529 MG/ML IV SOLN
16.0000 mL | Freq: Once | INTRAVENOUS | Status: AC | PRN
  Administered 2016-03-23: 16 mL via INTRAVENOUS

## 2016-03-24 ENCOUNTER — Telehealth: Payer: Self-pay | Admitting: *Deleted

## 2016-03-24 NOTE — Telephone Encounter (Signed)
Attempted to reach patient on home phone; received  Recording: "number has been disconnected".  Called emergency contact, daughter [name] who verified her mother's phone is no longer in service. Per Dr Leta Baptist, informed her that her mother's MRI result was stable with improving imaging results. There is no evidence of mass. He will continue with current treatment. Advised that if her mother has any questions to call back. She verbalized understanding, appreciation.

## 2016-03-25 ENCOUNTER — Encounter: Payer: Commercial Managed Care - HMO | Admitting: Speech Pathology

## 2016-04-05 ENCOUNTER — Ambulatory Visit: Payer: Commercial Managed Care - HMO | Admitting: *Deleted

## 2016-04-05 DIAGNOSIS — R41840 Attention and concentration deficit: Secondary | ICD-10-CM

## 2016-04-05 DIAGNOSIS — J060 Acute laryngopharyngitis: Secondary | ICD-10-CM | POA: Diagnosis not present

## 2016-04-05 DIAGNOSIS — J0101 Acute recurrent maxillary sinusitis: Secondary | ICD-10-CM | POA: Diagnosis not present

## 2016-04-05 DIAGNOSIS — R41841 Cognitive communication deficit: Secondary | ICD-10-CM

## 2016-04-05 NOTE — Patient Instructions (Signed)
Memory: 1. Make To do lists 2. Use calendars 3. Write reminder notes  Attention: 1. Take an activity (word search, crossword puzzle) and complete if while watching TV or carrying on a conversation 2. Reading - turn on the radio or TV while reading to work on focusing on one thing over another  Reasoning: 1. Complete deductive reasoning puzzles. Don't guess! Use problem solving and process of elimination to figure it out.
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
## 2016-04-07 ENCOUNTER — Ambulatory Visit: Payer: Commercial Managed Care - HMO | Admitting: Speech Pathology

## 2016-04-07 DIAGNOSIS — R41841 Cognitive communication deficit: Secondary | ICD-10-CM

## 2016-04-07 DIAGNOSIS — R41840 Attention and concentration deficit: Secondary | ICD-10-CM | POA: Diagnosis not present
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
## 2016-04-12 ENCOUNTER — Ambulatory Visit: Payer: Commercial Managed Care - HMO | Admitting: *Deleted

## 2016-04-12 ENCOUNTER — Ambulatory Visit: Payer: Commercial Managed Care - HMO | Attending: Orthopedic Surgery | Admitting: Speech Pathology

## 2016-04-12 DIAGNOSIS — R41841 Cognitive communication deficit: Secondary | ICD-10-CM | POA: Diagnosis not present

## 2016-04-12 DIAGNOSIS — R41840 Attention and concentration deficit: Secondary | ICD-10-CM | POA: Diagnosis not present
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
## 2016-04-14 ENCOUNTER — Ambulatory Visit: Payer: Commercial Managed Care - HMO | Admitting: Speech Pathology

## 2016-04-14 DIAGNOSIS — R41841 Cognitive communication deficit: Secondary | ICD-10-CM

## 2016-04-14 DIAGNOSIS — R41840 Attention and concentration deficit: Secondary | ICD-10-CM | POA: Diagnosis not present
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
## 2016-04-19 ENCOUNTER — Ambulatory Visit: Payer: Commercial Managed Care - HMO | Admitting: Speech Pathology

## 2016-04-19 DIAGNOSIS — R41840 Attention and concentration deficit: Secondary | ICD-10-CM | POA: Diagnosis not present

## 2016-04-19 DIAGNOSIS — R41841 Cognitive communication deficit: Secondary | ICD-10-CM | POA: Diagnosis not present
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
## 2016-04-21 ENCOUNTER — Ambulatory Visit: Payer: Commercial Managed Care - HMO | Admitting: Speech Pathology

## 2016-04-21 DIAGNOSIS — R41841 Cognitive communication deficit: Secondary | ICD-10-CM

## 2016-04-21 DIAGNOSIS — R41840 Attention and concentration deficit: Secondary | ICD-10-CM | POA: Diagnosis not present
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
## 2016-04-28 ENCOUNTER — Telehealth: Payer: Self-pay | Admitting: Speech Pathology

## 2016-04-28 NOTE — Telephone Encounter (Signed)
Offered pt 11:00 slot today for ST

## 2016-05-11 ENCOUNTER — Ambulatory Visit: Payer: Commercial Managed Care - HMO | Attending: Orthopedic Surgery | Admitting: Speech Pathology

## 2016-05-11 DIAGNOSIS — R41840 Attention and concentration deficit: Secondary | ICD-10-CM | POA: Diagnosis not present

## 2016-05-11 DIAGNOSIS — R41841 Cognitive communication deficit: Secondary | ICD-10-CM
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
## 2016-05-12 ENCOUNTER — Ambulatory Visit: Payer: Commercial Managed Care - HMO | Admitting: Speech Pathology

## 2016-05-12 DIAGNOSIS — R41841 Cognitive communication deficit: Secondary | ICD-10-CM

## 2016-05-12 DIAGNOSIS — R41840 Attention and concentration deficit: Secondary | ICD-10-CM | POA: Diagnosis not present
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
## 2016-05-16 ENCOUNTER — Ambulatory Visit: Payer: Commercial Managed Care - HMO | Admitting: Diagnostic Neuroimaging

## 2016-05-17 ENCOUNTER — Ambulatory Visit: Payer: Commercial Managed Care - HMO | Admitting: *Deleted

## 2016-05-17 DIAGNOSIS — R41840 Attention and concentration deficit: Secondary | ICD-10-CM | POA: Diagnosis not present

## 2016-05-17 DIAGNOSIS — R41841 Cognitive communication deficit: Secondary | ICD-10-CM | POA: Diagnosis not present
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
## 2016-05-19 ENCOUNTER — Ambulatory Visit: Payer: Commercial Managed Care - HMO | Admitting: Speech Pathology

## 2016-05-19 DIAGNOSIS — R41841 Cognitive communication deficit: Secondary | ICD-10-CM | POA: Diagnosis not present

## 2016-05-19 DIAGNOSIS — R41840 Attention and concentration deficit: Secondary | ICD-10-CM | POA: Diagnosis not present

## 2016-05-19 NOTE — Therapy (Signed)
[facility] [address] [place], [place], [address] Phone: [phone]   Fax:  [phone]  Speech Language Pathology Treatment  Patient Details  Name: [name] MRN: [id number] Date of Birth: Sep 30, 1945 Referring Provider: Dr. Andrey Spearman  Encounter Date: 05/19/2016      End of Session - 05/19/16 0927    SLP Start Time 0901   SLP Stop Time  5885   SLP Time Calculation (min) 26 min      Past Medical History:  Diagnosis Date  . Allergic rhinitis   . Anxiety   . COPD (chronic obstructive pulmonary disease) ([facility]) 05/23/2012  . Diverticulosis   . DJD (degenerative joint disease)   . Esophageal diverticulum   . Esophageal stricture   . GERD (gastroesophageal reflux disease)   . Hiatal hernia   . Hyperlipidemia 05/23/2012   T. Chol 234, LDL 130   . Hypertension   . IBS (irritable bowel syndrome)   . Osteopenia   . Traumatic arthritis     Past Surgical History:  Procedure Laterality Date  . CERVICAL SPINE SURGERY  2005  . LUNG BIOPSY Left    left side, not cancerous, thoracotomy  . VAGINAL HYSTERECTOMY      There were no vitals filed for this visit.      Subjective Assessment - 05/19/16 0927    Subjective "She tested me and I got a 29" - Pt arrived 15 min late               ADULT SLP TREATMENT - 05/19/16 0907      General Information   Behavior/Cognition Alert;Cooperative;Pleasant mood     Treatment Provided   Treatment provided Cognitive-Linquistic     Pain Assessment   Pain Assessment No/denies pain     Cognitive-Linquistic Treatment   Treatment focused on Cognition;Patient/family/caregiver education   Skilled Treatment Divided attention between complex card sort with 3 piles, each with a different rule and ST adding cards, while participating in simple conversation with supervision cues. Organization and complex reasoning task wth supervision cues. Pt reports success at  home managing meds and high level ADL's.      Assessment / Recommendations / Plan   Plan Continue with current plan of care     Progression Toward Goals   Progression toward goals Goals met, education completed, patient discharged from [facility] - 05/19/16 0917    Education provided Yes   Education Details Continue to do cognitive enhancing activities at home   Person(s) Educated Patient   Methods Explanation;Demonstration   Comprehension Verbalized understanding     SPEECH THERAPY DISCHARGE SUMMARY  Visits from Start of Care: 12  Current functional level related to goals / functional outcomes: See goals below   Remaining deficits: Mild high level cognitive impairments may exacerbate with fatigue, stress, high stimulation environments, illness   Education / Equipment: Compensations for cognitive impairments, goals/progess in therapy,  Plan: Patient agrees to discharge.  Patient goals were met. Patient is being discharged due to meeting the stated rehab goals.  ?????            SLP Short Term Goals - 05/19/16 0277      SLP SHORT TERM GOAL #1   Title Pt will perform alternating attention on 2 simple cognitive linguistic tasks with 85% accuracy on each   Status Achieved     SLP SHORT TERM GOAL #2   Title Pt  will solve mildly complex reasoning, organizing and attention to detail problems with 90% accuracy and rare min A   Status Achieved     SLP SHORT TERM GOAL #3   Title Pt will utilize external aids for financial, meal, and shopping recall and organization outside of tx over 2 sessions.    Status Achieved          SLP Long Term Goals - 2016-06-13 5643      SLP LONG TERM GOAL #1   Title Pt will divide attention between 2 mildly complex cognitive linguistic tasks with 90% on each and rare min A   Time 3   Period Weeks   Status Achieved     SLP LONG TERM GOAL #2   Title Pt will solve moderately complex reasoning, organization and  attention to detail problems with 90% accuracy and rare min A   Time 3   Period Weeks   Status Achieved          Plan - 06-13-16 0925    Clinical Impression Statement Pt has met all goals and reports success with high level ADL's   - Pt aware that cognition may be affected by high stimulation environments, fatigue, stress, illness with BI. D/C ST - pt in agreement      Patient will benefit from skilled therapeutic intervention in order to improve the following deficits and impairments:   Cognitive communication deficit      G-Codes - 2016/06/13 0928    Functional Assessment Tool Used NOMS   Functional Limitations Attention   Attention Goal Status (P2951) At least 1 percent but less than 20 percent impaired, limited or restricted   Attention Discharge Status (O8416) At least 1 percent but less than 20 percent impaired, limited or restricted      Problem List Patient Active Problem List   Diagnosis Date Noted  . Urinary frequency 09/10/2015  . Dysphagia 02/24/2015  . Traumatic arthritis 02/24/2015  . Fever 02/24/2015  . Low back pain 11/11/2014  . Lower extremity edema 10/07/2014  . Chest pain 02/25/2014  . Chest pain at rest 06/18/2012  . Hypokalemia 06/18/2012  . Anemia 06/18/2012  . Leukopenia 06/18/2012  . COPD (chronic obstructive pulmonary disease) ([facility]) 05/23/2012  . Osteopenia 05/23/2012  . Encounter for preventative adult health care exam with abnormal findings 05/17/2011  . THORACIC/LUMBOSACRAL NEURITIS/RADICULITIS UNSPEC 11/30/2009  . BACK PAIN 11/30/2009  . BACK PAIN WITH RADICULOPATHY 11/30/2009  . DEPRESSION/ANXIETY 06/01/2009  . MYOCARDIAL PERFUSION SCAN, WITH STRESS TEST, ABNORMAL 03/26/2009  . ALLERGIC REACTION, ACUTE 09/05/2008  . PULMONARY NODULE 08/29/2008  . Early satiety 08/25/2008  . WEIGHT LOSS 08/21/2008  . GROIN PAIN 08/21/2008  . Nonspecific (abnormal) findings on radiological and other examination of body structure 08/21/2008  .  Hyperlipidemia 12/25/2007  . ANXIETY 12/25/2007  . Essential hypertension 12/25/2007  . ALLERGIC RHINITIS 12/25/2007  . GERD 12/25/2007  . DIVERTICULOSIS, COLON 12/25/2007  . IBS 12/25/2007  . OSTEOPENIA 12/25/2007    Tyeshia Cornforth, Annye Rusk MS, CCC-SLP 13-Jun-2016, 9:28 AM  [facility] [address] [place], [place], [address] Phone: [phone]   Fax:  [phone]   Name: [name] MRN: [id number] Date of Birth: 07-Nov-1945

## 2016-05-24 ENCOUNTER — Encounter: Payer: Commercial Managed Care - HMO | Admitting: Speech Pathology

## 2016-05-26 ENCOUNTER — Encounter: Payer: Commercial Managed Care - HMO | Admitting: Speech Pathology

## 2016-06-08 DIAGNOSIS — R634 Abnormal weight loss: Secondary | ICD-10-CM | POA: Diagnosis not present

## 2016-06-08 DIAGNOSIS — N390 Urinary tract infection, site not specified: Secondary | ICD-10-CM | POA: Diagnosis not present

## 2016-06-08 DIAGNOSIS — J0101 Acute recurrent maxillary sinusitis: Secondary | ICD-10-CM | POA: Diagnosis not present

## 2016-06-08 DIAGNOSIS — R0981 Nasal congestion: Secondary | ICD-10-CM | POA: Diagnosis not present

## 2016-06-09 DIAGNOSIS — J189 Pneumonia, unspecified organism: Secondary | ICD-10-CM

## 2016-06-09 DIAGNOSIS — Z9289 Personal history of other medical treatment: Secondary | ICD-10-CM

## 2016-06-09 HISTORY — DX: Personal history of other medical treatment: Z92.89

## 2016-06-09 HISTORY — DX: Pneumonia, unspecified organism: J18.9

## 2016-06-14 DIAGNOSIS — J310 Chronic rhinitis: Secondary | ICD-10-CM | POA: Diagnosis not present

## 2016-06-19 ENCOUNTER — Observation Stay (HOSPITAL_COMMUNITY): Payer: Medicare HMO

## 2016-06-19 ENCOUNTER — Inpatient Hospital Stay (HOSPITAL_COMMUNITY)
Admission: EM | Admit: 2016-06-19 | Discharge: 2016-06-23 | DRG: 682 | Disposition: A | Discharge status: Home / Self Care | Payer: Medicare HMO | Attending: Internal Medicine | Admitting: Internal Medicine

## 2016-06-19 ENCOUNTER — Encounter (HOSPITAL_COMMUNITY): Payer: Self-pay | Admitting: Emergency Medicine

## 2016-06-19 DIAGNOSIS — Z79899 Other long term (current) drug therapy: Secondary | ICD-10-CM

## 2016-06-19 DIAGNOSIS — T502X6A Underdosing of carbonic-anhydrase inhibitors, benzothiadiazides and other diuretics, initial encounter: Secondary | ICD-10-CM | POA: Diagnosis present

## 2016-06-19 DIAGNOSIS — Z7982 Long term (current) use of aspirin: Secondary | ICD-10-CM

## 2016-06-19 DIAGNOSIS — E785 Hyperlipidemia, unspecified: Secondary | ICD-10-CM | POA: Diagnosis present

## 2016-06-19 DIAGNOSIS — Z803 Family history of malignant neoplasm of breast: Secondary | ICD-10-CM

## 2016-06-19 DIAGNOSIS — R531 Weakness: Secondary | ICD-10-CM | POA: Diagnosis present

## 2016-06-19 DIAGNOSIS — K5790 Diverticulosis of intestine, part unspecified, without perforation or abscess without bleeding: Secondary | ICD-10-CM | POA: Diagnosis present

## 2016-06-19 DIAGNOSIS — Z87891 Personal history of nicotine dependence: Secondary | ICD-10-CM

## 2016-06-19 DIAGNOSIS — J449 Chronic obstructive pulmonary disease, unspecified: Secondary | ICD-10-CM

## 2016-06-19 DIAGNOSIS — M1990 Unspecified osteoarthritis, unspecified site: Secondary | ICD-10-CM | POA: Diagnosis present

## 2016-06-19 DIAGNOSIS — J440 Chronic obstructive pulmonary disease with acute lower respiratory infection: Secondary | ICD-10-CM | POA: Diagnosis present

## 2016-06-19 DIAGNOSIS — D649 Anemia, unspecified: Secondary | ICD-10-CM | POA: Diagnosis present

## 2016-06-19 DIAGNOSIS — K589 Irritable bowel syndrome without diarrhea: Secondary | ICD-10-CM | POA: Diagnosis present

## 2016-06-19 DIAGNOSIS — T461X6A Underdosing of calcium-channel blockers, initial encounter: Secondary | ICD-10-CM | POA: Diagnosis present

## 2016-06-19 DIAGNOSIS — N179 Acute kidney failure, unspecified: Secondary | ICD-10-CM | POA: Diagnosis present

## 2016-06-19 DIAGNOSIS — J189 Pneumonia, unspecified organism: Secondary | ICD-10-CM | POA: Diagnosis present

## 2016-06-19 DIAGNOSIS — Z821 Family history of blindness and visual loss: Secondary | ICD-10-CM

## 2016-06-19 DIAGNOSIS — Z8249 Family history of ischemic heart disease and other diseases of the circulatory system: Secondary | ICD-10-CM

## 2016-06-19 DIAGNOSIS — I10 Essential (primary) hypertension: Secondary | ICD-10-CM | POA: Diagnosis present

## 2016-06-19 DIAGNOSIS — R634 Abnormal weight loss: Secondary | ICD-10-CM | POA: Diagnosis present

## 2016-06-19 DIAGNOSIS — K219 Gastro-esophageal reflux disease without esophagitis: Secondary | ICD-10-CM | POA: Diagnosis present

## 2016-06-19 DIAGNOSIS — Z91128 Patient's intentional underdosing of medication regimen for other reason: Secondary | ICD-10-CM

## 2016-06-19 DIAGNOSIS — M8580 Other specified disorders of bone density and structure, unspecified site: Secondary | ICD-10-CM | POA: Diagnosis present

## 2016-06-19 DIAGNOSIS — Z888 Allergy status to other drugs, medicaments and biological substances status: Secondary | ICD-10-CM

## 2016-06-19 DIAGNOSIS — R0602 Shortness of breath: Secondary | ICD-10-CM | POA: Diagnosis not present

## 2016-06-19 DIAGNOSIS — Z887 Allergy status to serum and vaccine status: Secondary | ICD-10-CM

## 2016-06-19 DIAGNOSIS — Z808 Family history of malignant neoplasm of other organs or systems: Secondary | ICD-10-CM

## 2016-06-19 DIAGNOSIS — E876 Hypokalemia: Secondary | ICD-10-CM | POA: Diagnosis present

## 2016-06-19 DIAGNOSIS — R509 Fever, unspecified: Secondary | ICD-10-CM

## 2016-06-19 DIAGNOSIS — I959 Hypotension, unspecified: Secondary | ICD-10-CM | POA: Diagnosis present

## 2016-06-19 DIAGNOSIS — Z90710 Acquired absence of both cervix and uterus: Secondary | ICD-10-CM

## 2016-06-19 LAB — BASIC METABOLIC PANEL
Anion gap: 13 (ref 5–15)
BUN: 13 mg/dL (ref 6–20)
CALCIUM: 8.6 mg/dL — AB (ref 8.9–10.3)
CO2: 33 mmol/L — AB (ref 22–32)
CREATININE: 1.96 mg/dL — AB (ref 0.44–1.00)
Chloride: 91 mmol/L — ABNORMAL LOW (ref 101–111)
GFR calc Af Amer: 28 mL/min — ABNORMAL LOW (ref >60)
GFR, EST NON AFRICAN AMERICAN: 24 mL/min — AB (ref >60)
GLUCOSE: 88 mg/dL (ref 65–99)
Potassium: 2.8 mmol/L — ABNORMAL LOW (ref 3.5–5.1)
Sodium: 137 mmol/L (ref 135–145)

## 2016-06-19 LAB — CBC WITH DIFFERENTIAL/PLATELET
BASOS PCT: 1 %
Basophils Absolute: 0.1 10*3/uL (ref 0.0–0.1)
EOS ABS: 0.5 10*3/uL (ref 0.0–0.7)
Eosinophils Relative: 6 %
HCT: 29.9 % — ABNORMAL LOW (ref 36.0–46.0)
HEMOGLOBIN: 9.6 g/dL — AB (ref 12.0–15.0)
LYMPHS ABS: 1.1 10*3/uL (ref 0.7–4.0)
LYMPHS PCT: 12 %
MCH: 29.1 pg (ref 26.0–34.0)
MCHC: 32.1 g/dL (ref 30.0–36.0)
MCV: 90.6 fL (ref 78.0–100.0)
MONO ABS: 0.7 10*3/uL (ref 0.1–1.0)
Monocytes Relative: 8 %
NEUTROS ABS: 6.5 10*3/uL (ref 1.7–7.7)
Neutrophils Relative %: 73 %
Platelets: 500 10*3/uL — ABNORMAL HIGH (ref 150–400)
RBC: 3.3 MIL/uL — ABNORMAL LOW (ref 3.87–5.11)
RDW: 15.5 % (ref 11.5–15.5)
WBC: 8.9 10*3/uL (ref 4.0–10.5)

## 2016-06-19 LAB — I-STAT CHEM 8, ED
BUN: 18 mg/dL (ref 6–20)
CALCIUM ION: 1.02 mmol/L — AB (ref 1.15–1.40)
CREATININE: 2.1 mg/dL — AB (ref 0.44–1.00)
Chloride: 91 mmol/L — ABNORMAL LOW (ref 101–111)
Glucose, Bld: 96 mg/dL (ref 65–99)
HCT: 30 % — ABNORMAL LOW (ref 36.0–46.0)
Hemoglobin: 10.2 g/dL — ABNORMAL LOW (ref 12.0–15.0)
Potassium: 2.8 mmol/L — ABNORMAL LOW (ref 3.5–5.1)
Sodium: 137 mmol/L (ref 135–145)
TCO2: 38 mmol/L (ref 0–100)

## 2016-06-19 MED ORDER — SODIUM CHLORIDE 0.9 % IV BOLUS (SEPSIS)
1000.0000 mL | Freq: Once | INTRAVENOUS | Status: AC
  Administered 2016-06-19: 1000 mL via INTRAVENOUS

## 2016-06-19 MED ORDER — POTASSIUM CHLORIDE CRYS ER 20 MEQ PO TBCR
40.0000 meq | EXTENDED_RELEASE_TABLET | Freq: Once | ORAL | Status: AC
  Administered 2016-06-19: 40 meq via ORAL
  Filled 2016-06-19: qty 2

## 2016-06-19 MED ORDER — SODIUM CHLORIDE 0.9 % IV SOLN
Freq: Once | INTRAVENOUS | Status: AC
  Administered 2016-06-19: 21:00:00 via INTRAVENOUS
  Filled 2016-06-19: qty 1000

## 2016-06-19 NOTE — ED Notes (Signed)
Patient transported to X-ray 

## 2016-06-19 NOTE — ED Triage Notes (Addendum)
Pt has been checking her blood pressure at home and it has been low for weeks. Pt BP 133/66 in triage. Pt also reports that she is battling cold symptoms for 3-4 months. Pt has an appointment with her PMD tomorrow.

## 2016-06-19 NOTE — H&P (Signed)
History and Physical    DAJANIQUE ROBLEY VZC:[id number] DOB: [birth year] DOA: 06/19/2016  PCP: Merrilee Seashore, MD   Patient coming from: Home.  Chief Complaint: Low blood pressure.  HPI: [name] is a 71 y.o. female with medical history significant of allergic rhinitis, anxiety, COPD, diverticulosis, esophageal diverticulum, GERD, esophageal stricture, hiatal hernia, hyperlipidemia, hypertension, IBS, osteopenia who is coming to the emergency department with complaints of low blood pressure for the past month.   She states that about a month ago she stopped her hydrochlorothiazide and amlodipine 12 point dizziness and weakness. However, she states that her symptoms have persisted. She complains of anorexia, insomnia, dyspnea, fatigue, night sweats, but not sure she has had a fever. She denies headache, sore throat, cough, chest pain, palpitations, pitting edema of the lower extremities. Denies abdominal pain, nausea, emesis, diarrhea, melena or hematochezia. She gets occasionally constipated. She denies dysuria or hematuria.  ED Course: The patient received IV fluids and potassium replacement. Her WBC was 8.9, hemoglobin 9.6 g/dL and platelets 500. Her sodium was 137, potassium 2.8, chloride 91, bicarbonate 33 mmol/L. Her BUN was 13, creatinine 1.96 and glucose 88 mg/dL.  A chest radiograph and renal ultrasound were ordered prior to the patient going to the medical floor. Results subsequently attached below.   Review of Systems: As per HPI otherwise 10 point review of systems negative.    Past Medical History:  Diagnosis Date  . Allergic rhinitis   . Anxiety   . COPD (chronic obstructive pulmonary disease) ([facility]) 05/23/2012  . Diverticulosis   . DJD (degenerative joint disease)   . Esophageal diverticulum   . Esophageal stricture   . GERD (gastroesophageal reflux disease)   . Hiatal hernia   . Hyperlipidemia 05/23/2012   T. Chol 234, LDL 130   . Hypertension   . IBS  (irritable bowel syndrome)   . Osteopenia   . Traumatic arthritis     Past Surgical History:  Procedure Laterality Date  . CERVICAL SPINE SURGERY  2005  . LUNG BIOPSY Left    left side, not cancerous, thoracotomy  . VAGINAL HYSTERECTOMY       reports that she quit smoking about 31 years ago. Her smoking use included Cigarettes. She has never used smokeless tobacco. She reports that she drinks alcohol. She reports that she does not use drugs.  Allergies  Allergen Reactions  . Ace Inhibitors Anaphylaxis    REACTION: angioedema  . Other Anaphylaxis    Calcium Channel Blocking Agent Diltiazem Analogues  . Pneumococcal Vaccines Other (See Comments)    Red rash, Knot (sore and itches), A fever for a couple days (per pt report)  . Tetanus Toxoid Other (See Comments)    Cellulitis in arm    Family History  Problem Relation Age of Onset  . Throat cancer Father   . Hypertension Sister   . Thyroid disease Daughter   . Hypertension Sister     x 2  . Breast cancer Maternal Aunt   . Blindness Sister     legally blind  . Colon cancer Neg Hx   . Stomach cancer Neg Hx     Prior to Admission medications   Medication Sig Start Date End Date Taking? Authorizing Provider  amLODipine (NORVASC) 10 MG tablet Take 1 tablet (10 mg total) by mouth daily. Patient taking differently: Take 5 mg by mouth daily.  12/04/15  Yes Penni Bombard, MD  aspirin EC 81 MG tablet Take 81 mg by mouth daily.  Yes Historical Provider, MD  atorvastatin (LIPITOR) 10 MG tablet Take 1 tablet (10 mg total) by mouth daily. 10/20/15  Yes Biagio Borg, MD  hydrochlorothiazide (HYDRODIURIL) 12.5 MG tablet Take 12.5 mg by mouth daily. 01/13/16  Yes Historical Provider, MD  pantoprazole (PROTONIX) 40 MG tablet Take 1 tablet (40 mg total) by mouth daily. 10/20/15  Yes Biagio Borg, MD  sodium chloride (OCEAN) 0.65 % SOLN nasal spray Place 2 sprays into both nostrils 3 (three) times daily.   Yes Historical Provider, MD     Physical Exam:  Constitutional: NAD, calm, comfortable Vitals:   06/19/16 2100 06/19/16 2115 06/19/16 2215 06/19/16 2230  BP: 136/84 119/56 117/61 124/63  Pulse: 77 78 90 92  Resp: 18   18  Temp:      TempSrc:      SpO2: 100% 100% 92% 97%  Weight:      Height:       Eyes: PERRL, lids and conjunctivae are pale. ENMT: Mucous membranes are moist. Posterior pharynx clear of any exudate or lesions. Neck: normal, supple, no masses, no thyromegaly Respiratory: Decreased breath sounds on bases, no wheezing, no crackles. Normal respiratory effort. No accessory muscle use.  Cardiovascular: Regular rate and rhythm, no murmurs / rubs / gallops. No extremity edema. 2+ pedal pulses. No carotid bruits.  Abdomen: Soft, no tenderness, no masses palpated. No hepatosplenomegaly. Bowel sounds positive.  Musculoskeletal: no clubbing / cyanosis.  Good ROM, no contractures. Normal muscle tone.  Skin: no rashes, lesions, ulcers on limited skin exam. Neurologic: CN 2-12 grossly intact. Sensation intact, DTR normal. Strength 5/5 in all 4.  Psychiatric: Normal judgment and insight. Alert and oriented x 3. Normal mood.    Labs on Admission: I have personally reviewed following labs and imaging studies  CBC:  Recent Labs Lab 06/19/16 2044 06/19/16 2127  WBC  --  8.9  NEUTROABS  --  6.5  HGB 10.2* 9.6*  HCT 30.0* 29.9*  MCV  --  90.6  PLT  --  240*   Basic Metabolic Panel:  Recent Labs Lab 06/19/16 2044 06/19/16 2127  NA 137 137  K 2.8* 2.8*  CL 91* 91*  CO2  --  33*  GLUCOSE 96 88  BUN 18 13  CREATININE 2.10* 1.96*  CALCIUM  --  8.6*   GFR: Estimated Creatinine Clearance: 23.3 mL/min (by C-G formula based on SCr of 1.96 mg/dL (H)). Liver Function Tests: No results for input(s): AST, ALT, ALKPHOS, BILITOT, PROT, ALBUMIN in the last 168 hours. No results for input(s): LIPASE, AMYLASE in the last 168 hours. No results for input(s): AMMONIA in the last 168 hours. Coagulation  Profile: No results for input(s): INR, PROTIME in the last 168 hours. Cardiac Enzymes: No results for input(s): CKTOTAL, CKMB, CKMBINDEX, TROPONINI in the last 168 hours. BNP (last 3 results) No results for input(s): PROBNP in the last 8760 hours. HbA1C: No results for input(s): HGBA1C in the last 72 hours. CBG: No results for input(s): GLUCAP in the last 168 hours. Lipid Profile: No results for input(s): CHOL, HDL, LDLCALC, TRIG, CHOLHDL, LDLDIRECT in the last 72 hours. Thyroid Function Tests: No results for input(s): TSH, T4TOTAL, FREET4, T3FREE, THYROIDAB in the last 72 hours. Anemia Panel: No results for input(s): VITAMINB12, FOLATE, FERRITIN, TIBC, IRON, RETICCTPCT in the last 72 hours. Urine analysis:    Component Value Date/Time   COLORURINE YELLOW 06/03/2014 1038   APPEARANCEUR CLEAR 06/03/2014 1038   LABSPEC 1.015 06/03/2014 1038   PHURINE  6.5 06/03/2014 [place] 06/03/2014 [place] 06/03/2014 [place] 06/03/2014 1038   [place] 06/03/2014 1038   PROTEINUR NEGATIVE 11/04/2008 1006   UROBILINOGEN 0.2 06/03/2014 1038   NITRITE NEGATIVE 06/03/2014 1038   LEUKOCYTESUR MODERATE (A) 06/03/2014 1038    Radiological Exams on Admission:  US renal  CLINICAL DATA:  Acute renal injury, elevated creatinine.  EXAM: RENAL / URINARY TRACT ULTRASOUND COMPLETE  COMPARISON:  08/22/2008 CT  FINDINGS: Right Kidney:  Length: 11.3 cm. No focal renal mass. Small extrarenal pelvis. Cortical-medullary distinction is maintained.  Left Kidney:  Length: 11.9 cm. Slight increase in echogenicity of the renal cortex of the cortical-medullary distinction is still maintained. No mass or hydronephrosis visualized.  Bladder:  Appears normal for degree of bladder distention. Bilateral ureteral jets are noted consistent with patency of the ureters.  IMPRESSION: Slight increased left renal echogenicity which may  reflect early medical renal disease. No obstructive uropathy or nephrolithiasis.   Electronically Signed   By: Ashley Royalty M.D.   On: 06/20/2016 00:06   CHEST  2 VIEW  COMPARISON:  06/08/2016 chest radiograph. CT chest dated 12/06/2010.  FINDINGS: Stable normal cardiac silhouette. Diffuse coarse pulmonary markings compatible with scarring. Increased prominence of pulmonary markings in the lung base. No pleural effusion. No pneumothorax. Mild reverse S curvature of the spine. Partially visualized anterior cervical fusion hardware.  IMPRESSION: Scarring in the lungs bilaterally. Increased prominence of lung markings in the lung bases may represent developing bronchitis or infiltrate.   Electronically Signed   By: Kristine Garbe M.D.   On: 06/19/2016 23:53   EKG ordered  Assessment/Plan Principal Problem:   AKI (acute kidney injury) ([facility]) Admit to telemetry/observation. Continue IV fluids. Check urinalysis. Follow-up BUN, creatinine and electrolytes in a.m. Consider nephrology evaluation if no improvement.  Active Problems:   Community-acquired pneumonia. Per patient, she has been feeling weak and dizzy for the past few weeks. Occasional dyspnea, frequent night sweats, but denies productive cough.  However chest radiograph showed bibasilar infiltrates. She subsequently developed a fever of 103F on the medical floor. Acetaminophen was given. Started workup and IV antibiotics for CPAP.    Hyperlipidemia Continue atorvastatin 10 mg by mouth daily. Monitor LFTs periodically. Yearly follow-up of lipid panel as an outpatient.    Essential hypertension Antihypertensives are on hold. Monitor blood pressure.    GERD Continue pantoprazole 40 mg by mouth daily.    COPD (chronic obstructive pulmonary disease) ([facility]) Continue supplemental oxygen. Bronchodilators as needed.    Hypokalemia Replaced. Check magnesium level. Follow-up potassium level in  a.m.    Anemia Check anemia profile. Monitor hematocrit and hemoglobin.    DVT prophylaxis: Lovenox SQ. Code Status: Full code. Family Communication:  Disposition Plan: Initially admitted for AKI, but subsequently showed right lower lobe infiltrate and developed fever on the floor. Consults called:  Admission status: Observation/telemetry.   Reubin Milan MD [facility] Pager [phone].  If 7PM-7AM, please contact night-coverage [email or web address] Password [facility]  06/19/2016, 10:56 PM

## 2016-06-20 ENCOUNTER — Observation Stay (HOSPITAL_COMMUNITY): Payer: Medicare HMO

## 2016-06-20 DIAGNOSIS — J449 Chronic obstructive pulmonary disease, unspecified: Secondary | ICD-10-CM | POA: Diagnosis not present

## 2016-06-20 DIAGNOSIS — Z808 Family history of malignant neoplasm of other organs or systems: Secondary | ICD-10-CM | POA: Diagnosis not present

## 2016-06-20 DIAGNOSIS — E785 Hyperlipidemia, unspecified: Secondary | ICD-10-CM | POA: Diagnosis present

## 2016-06-20 DIAGNOSIS — Z803 Family history of malignant neoplasm of breast: Secondary | ICD-10-CM | POA: Diagnosis not present

## 2016-06-20 DIAGNOSIS — N179 Acute kidney failure, unspecified: Secondary | ICD-10-CM | POA: Diagnosis present

## 2016-06-20 DIAGNOSIS — D649 Anemia, unspecified: Secondary | ICD-10-CM | POA: Diagnosis present

## 2016-06-20 DIAGNOSIS — R531 Weakness: Secondary | ICD-10-CM | POA: Diagnosis present

## 2016-06-20 DIAGNOSIS — Z79899 Other long term (current) drug therapy: Secondary | ICD-10-CM | POA: Diagnosis not present

## 2016-06-20 DIAGNOSIS — I959 Hypotension, unspecified: Secondary | ICD-10-CM | POA: Diagnosis present

## 2016-06-20 DIAGNOSIS — K589 Irritable bowel syndrome without diarrhea: Secondary | ICD-10-CM | POA: Diagnosis present

## 2016-06-20 DIAGNOSIS — K5790 Diverticulosis of intestine, part unspecified, without perforation or abscess without bleeding: Secondary | ICD-10-CM | POA: Diagnosis present

## 2016-06-20 DIAGNOSIS — T461X6A Underdosing of calcium-channel blockers, initial encounter: Secondary | ICD-10-CM | POA: Diagnosis present

## 2016-06-20 DIAGNOSIS — E876 Hypokalemia: Secondary | ICD-10-CM | POA: Diagnosis present

## 2016-06-20 DIAGNOSIS — M8580 Other specified disorders of bone density and structure, unspecified site: Secondary | ICD-10-CM | POA: Diagnosis present

## 2016-06-20 DIAGNOSIS — I10 Essential (primary) hypertension: Secondary | ICD-10-CM | POA: Diagnosis present

## 2016-06-20 DIAGNOSIS — Z91128 Patient's intentional underdosing of medication regimen for other reason: Secondary | ICD-10-CM | POA: Diagnosis not present

## 2016-06-20 DIAGNOSIS — J440 Chronic obstructive pulmonary disease with acute lower respiratory infection: Secondary | ICD-10-CM | POA: Diagnosis present

## 2016-06-20 DIAGNOSIS — R634 Abnormal weight loss: Secondary | ICD-10-CM | POA: Diagnosis present

## 2016-06-20 DIAGNOSIS — Z821 Family history of blindness and visual loss: Secondary | ICD-10-CM | POA: Diagnosis not present

## 2016-06-20 DIAGNOSIS — Z888 Allergy status to other drugs, medicaments and biological substances status: Secondary | ICD-10-CM | POA: Diagnosis not present

## 2016-06-20 DIAGNOSIS — M1990 Unspecified osteoarthritis, unspecified site: Secondary | ICD-10-CM | POA: Diagnosis present

## 2016-06-20 DIAGNOSIS — Z8249 Family history of ischemic heart disease and other diseases of the circulatory system: Secondary | ICD-10-CM | POA: Diagnosis not present

## 2016-06-20 DIAGNOSIS — T502X6A Underdosing of carbonic-anhydrase inhibitors, benzothiadiazides and other diuretics, initial encounter: Secondary | ICD-10-CM | POA: Diagnosis present

## 2016-06-20 DIAGNOSIS — J189 Pneumonia, unspecified organism: Secondary | ICD-10-CM | POA: Diagnosis present

## 2016-06-20 DIAGNOSIS — Z87891 Personal history of nicotine dependence: Secondary | ICD-10-CM | POA: Diagnosis not present

## 2016-06-20 DIAGNOSIS — K219 Gastro-esophageal reflux disease without esophagitis: Secondary | ICD-10-CM | POA: Diagnosis present

## 2016-06-20 LAB — URINALYSIS, ROUTINE W REFLEX MICROSCOPIC
Bilirubin Urine: NEGATIVE
GLUCOSE, UA: NEGATIVE mg/dL
Ketones, ur: NEGATIVE mg/dL
Nitrite: NEGATIVE
Protein, ur: NEGATIVE mg/dL
SPECIFIC GRAVITY, URINE: 1.008 (ref 1.005–1.030)
pH: 7 (ref 5.0–8.0)

## 2016-06-20 LAB — CBC
HCT: 23.6 % — ABNORMAL LOW (ref 36.0–46.0)
Hemoglobin: 7.5 g/dL — ABNORMAL LOW (ref 12.0–15.0)
MCH: 28.6 pg (ref 26.0–34.0)
MCHC: 31.8 g/dL (ref 30.0–36.0)
MCV: 90.1 fL (ref 78.0–100.0)
PLATELETS: 456 10*3/uL — AB (ref 150–400)
RBC: 2.62 MIL/uL — ABNORMAL LOW (ref 3.87–5.11)
RDW: 15.5 % (ref 11.5–15.5)
WBC: 9.2 10*3/uL (ref 4.0–10.5)

## 2016-06-20 LAB — CBC WITH DIFFERENTIAL/PLATELET
Basophils Absolute: 0.1 10*3/uL (ref 0.0–0.1)
Basophils Relative: 1 %
EOS ABS: 0.4 10*3/uL (ref 0.0–0.7)
EOS PCT: 5 %
HCT: 22.1 % — ABNORMAL LOW (ref 36.0–46.0)
Hemoglobin: 7.1 g/dL — ABNORMAL LOW (ref 12.0–15.0)
LYMPHS ABS: 0.6 10*3/uL — AB (ref 0.7–4.0)
Lymphocytes Relative: 7 %
MCH: 28.9 pg (ref 26.0–34.0)
MCHC: 32.1 g/dL (ref 30.0–36.0)
MCV: 89.8 fL (ref 78.0–100.0)
Monocytes Absolute: 1 10*3/uL (ref 0.1–1.0)
Monocytes Relative: 11 %
Neutro Abs: 6.9 10*3/uL (ref 1.7–7.7)
Neutrophils Relative %: 76 %
PLATELETS: 409 10*3/uL — AB (ref 150–400)
RBC: 2.46 MIL/uL — AB (ref 3.87–5.11)
RDW: 15.4 % (ref 11.5–15.5)
WBC: 9 10*3/uL (ref 4.0–10.5)

## 2016-06-20 LAB — INFLUENZA PANEL BY PCR (TYPE A & B)
Influenza A By PCR: NEGATIVE
Influenza B By PCR: NEGATIVE

## 2016-06-20 LAB — LACTIC ACID, PLASMA
LACTIC ACID, VENOUS: 0.9 mmol/L (ref 0.5–1.9)
LACTIC ACID, VENOUS: 0.9 mmol/L (ref 0.5–1.9)

## 2016-06-20 LAB — COMPREHENSIVE METABOLIC PANEL
ALK PHOS: 60 U/L (ref 38–126)
ALT: 13 U/L — AB (ref 14–54)
AST: 21 U/L (ref 15–41)
Albumin: 1.7 g/dL — ABNORMAL LOW (ref 3.5–5.0)
Anion gap: 9 (ref 5–15)
BILIRUBIN TOTAL: 1 mg/dL (ref 0.3–1.2)
BUN: 12 mg/dL (ref 6–20)
CALCIUM: 7.4 mg/dL — AB (ref 8.9–10.3)
CO2: 29 mmol/L (ref 22–32)
CREATININE: 1.75 mg/dL — AB (ref 0.44–1.00)
Chloride: 101 mmol/L (ref 101–111)
GFR, EST AFRICAN AMERICAN: 32 mL/min — AB (ref >60)
GFR, EST NON AFRICAN AMERICAN: 28 mL/min — AB (ref >60)
Glucose, Bld: 116 mg/dL — ABNORMAL HIGH (ref 65–99)
Potassium: 3 mmol/L — ABNORMAL LOW (ref 3.5–5.1)
Sodium: 139 mmol/L (ref 135–145)
Total Protein: 5.1 g/dL — ABNORMAL LOW (ref 6.5–8.1)

## 2016-06-20 LAB — RETICULOCYTES
RBC.: 2.52 MIL/uL — ABNORMAL LOW (ref 3.87–5.11)
Retic Count, Absolute: 32.8 10*3/uL (ref 19.0–186.0)
Retic Ct Pct: 1.3 % (ref 0.4–3.1)

## 2016-06-20 LAB — IRON AND TIBC
Iron: 9 ug/dL — ABNORMAL LOW (ref 28–170)
Saturation Ratios: 7 % — ABNORMAL LOW (ref 10.4–31.8)
TIBC: 137 ug/dL — AB (ref 250–450)
UIBC: 128 ug/dL

## 2016-06-20 LAB — BASIC METABOLIC PANEL
ANION GAP: 8 (ref 5–15)
BUN: 11 mg/dL (ref 6–20)
CO2: 29 mmol/L (ref 22–32)
Calcium: 7.9 mg/dL — ABNORMAL LOW (ref 8.9–10.3)
Chloride: 101 mmol/L (ref 101–111)
Creatinine, Ser: 1.6 mg/dL — ABNORMAL HIGH (ref 0.44–1.00)
GFR, EST AFRICAN AMERICAN: 36 mL/min — AB (ref >60)
GFR, EST NON AFRICAN AMERICAN: 31 mL/min — AB (ref >60)
GLUCOSE: 93 mg/dL (ref 65–99)
Potassium: 2.9 mmol/L — ABNORMAL LOW (ref 3.5–5.1)
Sodium: 138 mmol/L (ref 135–145)

## 2016-06-20 LAB — FERRITIN: Ferritin: 316 ng/mL — ABNORMAL HIGH (ref 11–307)

## 2016-06-20 LAB — MAGNESIUM: Magnesium: 1.9 mg/dL (ref 1.7–2.4)

## 2016-06-20 LAB — LACTATE DEHYDROGENASE: LDH: 115 U/L (ref 98–192)

## 2016-06-20 LAB — FOLATE: FOLATE: 8.7 ng/mL (ref >5.9)

## 2016-06-20 LAB — VITAMIN B12: VITAMIN B 12: 730 pg/mL (ref 180–914)

## 2016-06-20 LAB — PREPARE RBC (CROSSMATCH)

## 2016-06-20 LAB — STREP PNEUMONIAE URINARY ANTIGEN: Strep Pneumo Urinary Antigen: NEGATIVE

## 2016-06-20 MED ORDER — SODIUM CHLORIDE 0.9% FLUSH
3.0000 mL | Freq: Two times a day (BID) | INTRAVENOUS | Status: DC
  Administered 2016-06-20 – 2016-06-23 (×5): 3 mL via INTRAVENOUS

## 2016-06-20 MED ORDER — SODIUM CHLORIDE 0.9 % IV BOLUS (SEPSIS)
1000.0000 mL | Freq: Once | INTRAVENOUS | Status: AC
  Administered 2016-06-20: 1000 mL via INTRAVENOUS

## 2016-06-20 MED ORDER — DEXTROSE 5 % IV SOLN
1.0000 g | INTRAVENOUS | Status: DC
  Administered 2016-06-20 – 2016-06-22 (×3): 1 g via INTRAVENOUS
  Filled 2016-06-20 (×4): qty 10

## 2016-06-20 MED ORDER — ASPIRIN EC 81 MG PO TBEC
81.0000 mg | DELAYED_RELEASE_TABLET | Freq: Every day | ORAL | Status: DC
Start: 2016-06-20 — End: 2016-06-23
  Administered 2016-06-20 – 2016-06-23 (×4): 81 mg via ORAL
  Filled 2016-06-20 (×4): qty 1

## 2016-06-20 MED ORDER — ACETAMINOPHEN 325 MG PO TABS
650.0000 mg | ORAL_TABLET | Freq: Four times a day (QID) | ORAL | Status: DC | PRN
Start: 2016-06-20 — End: 2016-06-23
  Administered 2016-06-20 – 2016-06-23 (×5): 650 mg via ORAL
  Filled 2016-06-20 (×6): qty 2

## 2016-06-20 MED ORDER — SODIUM CHLORIDE 0.9 % IV SOLN
Freq: Once | INTRAVENOUS | Status: AC
  Administered 2016-06-20: 09:00:00 via INTRAVENOUS

## 2016-06-20 MED ORDER — DEXTROSE 5 % IV SOLN
500.0000 mg | Freq: Once | INTRAVENOUS | Status: AC
  Administered 2016-06-20: 500 mg via INTRAVENOUS
  Filled 2016-06-20: qty 500

## 2016-06-20 MED ORDER — ATORVASTATIN CALCIUM 10 MG PO TABS
10.0000 mg | ORAL_TABLET | Freq: Every day | ORAL | Status: DC
  Administered 2016-06-20 – 2016-06-23 (×4): 10 mg via ORAL
  Filled 2016-06-20 (×4): qty 1

## 2016-06-20 MED ORDER — DEXTROSE 5 % IV SOLN
500.0000 mg | INTRAVENOUS | Status: DC
  Administered 2016-06-21: 500 mg via INTRAVENOUS
  Filled 2016-06-20 (×2): qty 500

## 2016-06-20 MED ORDER — HEPARIN SODIUM (PORCINE) 5000 UNIT/ML IJ SOLN
5000.0000 [IU] | Freq: Three times a day (TID) | INTRAMUSCULAR | Status: DC
  Administered 2016-06-20 – 2016-06-23 (×11): 5000 [IU] via SUBCUTANEOUS
  Filled 2016-06-20 (×11): qty 1

## 2016-06-20 MED ORDER — ONDANSETRON HCL 4 MG/2ML IJ SOLN
4.0000 mg | Freq: Four times a day (QID) | INTRAMUSCULAR | Status: DC | PRN
  Administered 2016-06-20: 4 mg via INTRAVENOUS
  Filled 2016-06-20: qty 2

## 2016-06-20 MED ORDER — POTASSIUM CHLORIDE IN NACL 20-0.9 MEQ/L-% IV SOLN
INTRAVENOUS | Status: DC
  Administered 2016-06-20 – 2016-06-22 (×4): via INTRAVENOUS
  Filled 2016-06-20 (×5): qty 1000

## 2016-06-20 MED ORDER — POTASSIUM CHLORIDE CRYS ER 20 MEQ PO TBCR
40.0000 meq | EXTENDED_RELEASE_TABLET | Freq: Once | ORAL | Status: AC
  Administered 2016-06-20: 40 meq via ORAL
  Filled 2016-06-20: qty 2

## 2016-06-20 MED ORDER — PANTOPRAZOLE SODIUM 40 MG PO TBEC
40.0000 mg | DELAYED_RELEASE_TABLET | Freq: Every day | ORAL | Status: DC
Start: 2016-06-20 — End: 2016-06-23
  Administered 2016-06-20 – 2016-06-23 (×4): 40 mg via ORAL
  Filled 2016-06-20 (×4): qty 1

## 2016-06-20 MED ORDER — IPRATROPIUM-ALBUTEROL 0.5-2.5 (3) MG/3ML IN SOLN: 3.0000 mL | RESPIRATORY_TRACT | Status: DC | PRN

## 2016-06-20 MED ORDER — DEXTROSE 5 % IV SOLN
1.0000 g | Freq: Once | INTRAVENOUS | Status: AC
  Administered 2016-06-20: 1 g via INTRAVENOUS
  Filled 2016-06-20: qty 10

## 2016-06-20 MED ORDER — SALINE SPRAY 0.65 % NA SOLN
2.0000 | Freq: Three times a day (TID) | NASAL | Status: DC
  Administered 2016-06-20 – 2016-06-22 (×4): 2 via NASAL
  Filled 2016-06-20: qty 44

## 2016-06-20 NOTE — ED Provider Notes (Signed)
[facility] DEPT Provider Note   CSN: [id number] Arrival date & time: 06/19/16  1727     History   Chief Complaint Chief Complaint  Patient presents with  . Hypotension    HPI [name] is a 71 y.o. female.  The history is provided by the patient.  Weakness  Primary symptoms comment: fatigue. This is a new problem. The current episode started more than 2 days ago. The problem has not changed since onset.There was no focality noted. There has been no fever. Pertinent negatives include no shortness of breath, no chest pain, no vomiting, no altered mental status and no confusion.    Past Medical History:  Diagnosis Date  . Allergic rhinitis   . Anxiety   . COPD (chronic obstructive pulmonary disease) ([place]) 05/23/2012  . Diverticulosis   . DJD (degenerative joint disease)   . Esophageal diverticulum   . Esophageal stricture   . GERD (gastroesophageal reflux disease)   . Hiatal hernia   . Hyperlipidemia 05/23/2012   T. Chol 234, LDL 130   . Hypertension   . IBS (irritable bowel syndrome)   . Osteopenia   . Traumatic arthritis     Patient Active Problem List   Diagnosis Date Noted  . AKI (acute kidney injury) ([place]) 06/19/2016  . Urinary frequency 09/10/2015  . Dysphagia 02/24/2015  . Traumatic arthritis 02/24/2015  . Fever 02/24/2015  . Low back pain 11/11/2014  . Lower extremity edema 10/07/2014  . Chest pain 02/25/2014  . Chest pain at rest 06/18/2012  . Hypokalemia 06/18/2012  . Anemia 06/18/2012  . Leukopenia 06/18/2012  . COPD (chronic obstructive pulmonary disease) ([place]) 05/23/2012  . Osteopenia 05/23/2012  . Encounter for preventative adult health care exam with abnormal findings 05/17/2011  . THORACIC/LUMBOSACRAL NEURITIS/RADICULITIS UNSPEC 11/30/2009  . BACK PAIN 11/30/2009  . BACK PAIN WITH RADICULOPATHY 11/30/2009  . DEPRESSION/ANXIETY 06/01/2009  . MYOCARDIAL PERFUSION SCAN, WITH STRESS TEST, ABNORMAL 03/26/2009  . ALLERGIC REACTION,  ACUTE 09/05/2008  . PULMONARY NODULE 08/29/2008  . Early satiety 08/25/2008  . WEIGHT LOSS 08/21/2008  . GROIN PAIN 08/21/2008  . Nonspecific (abnormal) findings on radiological and other examination of body structure 08/21/2008  . Hyperlipidemia 12/25/2007  . ANXIETY 12/25/2007  . Essential hypertension 12/25/2007  . ALLERGIC RHINITIS 12/25/2007  . GERD 12/25/2007  . DIVERTICULOSIS, COLON 12/25/2007  . IBS 12/25/2007  . OSTEOPENIA 12/25/2007    Past Surgical History:  Procedure Laterality Date  . CERVICAL SPINE SURGERY  2005  . LUNG BIOPSY Left    left side, not cancerous, thoracotomy  . VAGINAL HYSTERECTOMY      OB History    No data available       Home Medications    Prior to Admission medications   Medication Sig Start Date End Date Taking? Authorizing Provider  amLODipine (NORVASC) 10 MG tablet Take 1 tablet (10 mg total) by mouth daily. Patient taking differently: Take 5 mg by mouth daily.  12/04/15  Yes Penni Bombard, MD  aspirin EC 81 MG tablet Take 81 mg by mouth daily.   Yes Historical Provider, MD  atorvastatin (LIPITOR) 10 MG tablet Take 1 tablet (10 mg total) by mouth daily. 10/20/15  Yes Biagio Borg, MD  hydrochlorothiazide (HYDRODIURIL) 12.5 MG tablet Take 12.5 mg by mouth daily. 01/13/16  Yes Historical Provider, MD  pantoprazole (PROTONIX) 40 MG tablet Take 1 tablet (40 mg total) by mouth daily. 10/20/15  Yes Biagio Borg, MD  sodium chloride (OCEAN) 0.65 %  SOLN nasal spray Place 2 sprays into both nostrils 3 (three) times daily.   Yes Historical Provider, MD    Family History Family History  Problem Relation Age of Onset  . Throat cancer Father   . Hypertension Sister   . Thyroid disease Daughter   . Hypertension Sister     x 2  . Breast cancer Maternal Aunt   . Blindness Sister     legally blind  . Colon cancer Neg Hx   . Stomach cancer Neg Hx     Social History Social History  Substance Use Topics  . Smoking status: Former Smoker     Types: Cigarettes    Quit date: 05/04/1985  . Smokeless tobacco: Never Used  . Alcohol use 0.0 oz/week     Comment: occasional     Allergies   Ace inhibitors; Other; Pneumococcal vaccines; and Tetanus toxoid   Review of Systems Review of Systems  Respiratory: Negative for shortness of breath.   Cardiovascular: Negative for chest pain.  Gastrointestinal: Negative for vomiting.  Neurological: Positive for weakness.  Psychiatric/Behavioral: Negative for confusion.  All other systems reviewed and are negative.    Physical Exam Updated Vital Signs BP (!) 135/53 (BP Location: Left Arm)   Pulse 99   Temp 98.1 F (36.7 C) (Oral)   Resp 17   Ht 5\' 3"  (1.6 m)   Wt 141 lb (64 kg)   SpO2 97%   BMI 24.98 kg/m   Physical Exam  Constitutional: She is oriented to person, place, and time. She appears well-developed and well-nourished. No distress.  HENT:  Head: Normocephalic.  Nose: Nose normal.  Eyes: Conjunctivae are normal.  Neck: Neck supple. No tracheal deviation present.  Cardiovascular: Normal rate, regular rhythm and normal heart sounds.   Pulmonary/Chest: Effort normal and breath sounds normal. No respiratory distress.  Abdominal: Soft. She exhibits no distension. There is no tenderness. There is no rebound and no guarding.  Neurological: She is alert and oriented to person, place, and time.  Skin: Skin is warm and dry.  Psychiatric: She has a normal mood and affect.     ED Treatments / Results  Labs (all labs ordered are listed, but only abnormal results are displayed) Labs Reviewed  BASIC METABOLIC PANEL - Abnormal; Notable for the following:       Result Value   Potassium 2.8 (*)    Chloride 91 (*)    CO2 33 (*)    Creatinine, Ser 1.96 (*)    Calcium 8.6 (*)    GFR calc non Af Amer 24 (*)    GFR calc Af Amer 28 (*)    All other components within normal limits  CBC WITH DIFFERENTIAL/PLATELET - Abnormal; Notable for the following:    RBC 3.30 (*)     Hemoglobin 9.6 (*)    HCT 29.9 (*)    Platelets 500 (*)    All other components within normal limits  I-STAT CHEM 8, ED - Abnormal; Notable for the following:    Potassium 2.8 (*)    Chloride 91 (*)    Creatinine, Ser 2.10 (*)    Calcium, Ion 1.02 (*)    Hemoglobin 10.2 (*)    HCT 30.0 (*)    All other components within normal limits  CULTURE, BLOOD (ROUTINE X 2)  CULTURE, BLOOD (ROUTINE X 2)  CULTURE, EXPECTORATED SPUTUM-ASSESSMENT  GRAM STAIN  MAGNESIUM  COMPREHENSIVE METABOLIC PANEL  STREP PNEUMONIAE URINARY ANTIGEN  CBC WITH DIFFERENTIAL/PLATELET  URINALYSIS, ROUTINE  W REFLEX MICROSCOPIC    EKG  EKG Interpretation None       Radiology Dg Chest 2 View  Result Date: 06/19/2016 CLINICAL DATA:  71 y/o  F; COPD.  Shortness of breath. EXAM: CHEST  2 VIEW COMPARISON:  06/08/2016 chest radiograph. CT chest dated 12/06/2010. FINDINGS: Stable normal cardiac silhouette. Diffuse coarse pulmonary markings compatible with scarring. Increased prominence of pulmonary markings in the lung base. No pleural effusion. No pneumothorax. Mild reverse S curvature of the spine. Partially visualized anterior cervical fusion hardware. IMPRESSION: Scarring in the lungs bilaterally. Increased prominence of lung markings in the lung bases may represent developing bronchitis or infiltrate. Electronically Signed   By: Kristine Garbe M.D.   On: 06/19/2016 23:53   US Renal  Result Date: 06/20/2016 CLINICAL DATA:  Acute renal injury, elevated creatinine. EXAM: RENAL / URINARY TRACT ULTRASOUND COMPLETE COMPARISON:  08/22/2008 CT FINDINGS: Right Kidney: Length: 11.3 cm. No focal renal mass. Small extrarenal pelvis. Cortical-medullary distinction is maintained. Left Kidney: Length: 11.9 cm. Slight increase in echogenicity of the renal cortex of the cortical-medullary distinction is still maintained. No mass or hydronephrosis visualized. Bladder: Appears normal for degree of bladder distention. Bilateral  ureteral jets are noted consistent with patency of the ureters. IMPRESSION: Slight increased left renal echogenicity which may reflect early medical renal disease. No obstructive uropathy or nephrolithiasis. Electronically Signed   By: Ashley Royalty M.D.   On: 06/20/2016 00:06    Procedures Procedures (including critical care time)  Medications Ordered in ED Medications  sodium chloride (OCEAN) 0.65 % nasal spray 2 spray (not administered)  atorvastatin (LIPITOR) tablet 10 mg (not administered)  pantoprazole (PROTONIX) EC tablet 40 mg (not administered)  aspirin EC tablet 81 mg (not administered)  heparin injection 5,000 Units (not administered)  sodium chloride flush (NS) 0.9 % injection 3 mL (not administered)  0.9 % NaCl with KCl 20 mEq/ L  infusion (not administered)  acetaminophen (TYLENOL) tablet 650 mg (not administered)  cefTRIAXone (ROCEPHIN) 1 g in dextrose 5 % 50 mL IVPB (not administered)  azithromycin (ZITHROMAX) 500 mg in dextrose 5 % 250 mL IVPB (not administered)  cefTRIAXone (ROCEPHIN) 1 g in dextrose 5 % 50 mL IVPB (not administered)  azithromycin (ZITHROMAX) 500 mg in dextrose 5 % 250 mL IVPB (not administered)  sodium chloride 0.9 % bolus 1,000 mL (0 mLs Intravenous Stopped 06/20/16 0020)  potassium chloride SA (K-DUR,KLOR-CON) CR tablet 40 mEq (40 mEq Oral Given 06/19/16 2111)  sodium chloride 0.9 % 1,000 mL with potassium chloride 80 mEq infusion ( Intravenous Given 06/19/16 2108)     Initial Impression / Assessment and Plan / ED Course  I have reviewed the triage vital signs and the nursing notes.  Pertinent labs & imaging results that were available during my care of the patient were reviewed by me and considered in my medical decision making (see chart for details).     71 y.o. female presents with weakness, concerned that her BP has been running low on her wrist cuff readings at home. Worse with activity. She has stopped taking her HTN meds and her BP is now  normal. Screening istat chem 8 suggestive of AKI which is confirmed on labs. No other significant abnormalities. Potassium repleted and is likely low 2/2 diuresis with HCTZ. Will admit for IVF hydration and monitoring to track renal function. Hospitalist was consulted for admission and will see the patient in the emergency department.   Final Clinical Impressions(s) / ED Diagnoses  Final diagnoses:  AKI (acute kidney injury) ([place])  Acute hypokalemia    New Prescriptions Current Discharge Medication List       Leo Grosser, MD 06/20/16 [id number]

## 2016-06-20 NOTE — Progress Notes (Signed)
PROGRESS NOTE    [name]  BZJ:[id number] DOB: December 20, 1945 DOA: 06/19/2016 PCP: Merrilee Seashore, MD   Outpatient Specialists:     Brief Narrative:  [name] is a 71 y.o. female with medical history significant of allergic rhinitis, anxiety, COPD, diverticulosis, esophageal diverticulum, GERD, esophageal stricture, hiatal hernia, hyperlipidemia, hypertension, IBS, osteopenia who is coming to the emergency department with complaints of low blood pressure for the past month.   She states that about a month ago she stopped her hydrochlorothiazide and amlodipine due to dizziness and weakness. However, she states that her symptoms have persisted. She complains of anorexia, insomnia, dyspnea, fatigue, night sweats, but not sure she has had a fever.   ED Course: The patient received IV fluids and potassium replacement. Her WBC was 8.9, hemoglobin 9.6 g/dL and platelets 500. Her sodium was 137, potassium 2.8, chloride 91, bicarbonate 33 mmol/L. Her BUN was 13, creatinine 1.96 and glucose 88 mg/dL.   Assessment & Plan:   Principal Problem:   AKI (acute kidney injury) ([place]) Active Problems:   Hyperlipidemia   Essential hypertension   GERD   COPD (chronic obstructive pulmonary disease) ([facility])   Hypokalemia   Anemia   Community acquired pneumonia   Fever -r/o flu -xray suspicious of PNA -IV abx  Unintentional weight loss- worrisome for maligancy -CT chest ordered with out contrast due to AKI (last CT scan had nodular densities)  AKI -IVF and recheck -2017 baseline < 1  Hypokalemia -replete -Mg ok  Anemia -heme test stools -patient states up to date on colonoscopy -transfuse 2 units PRBC      DVT prophylaxis:  SQ Heparin  Code Status: Full Code   Family Communication: patient  Disposition Plan:     Consultants:        Subjective: Has been having trouble with sinuses for several months  Objective: Vitals:   06/19/16 2300 06/20/16  0042 06/20/16 0254 06/20/16 0651  BP:  (!) 135/53 (!) 90/39 (!) 94/47  Pulse: 97 99 79 65  Resp:  17 17 17   Temp:  (!) 103 F (39.4 C) (!) 101.3 F (38.5 C) 99 F (37.2 C)  TempSrc:  Oral Oral Oral  SpO2: 97% 97% 93% 97%  Weight:      Height:        Intake/Output Summary (Last 24 hours) at 06/20/16 1139 Last data filed at 06/20/16 0804  Gross per 24 hour  Intake          1616.25 ml  Output              400 ml  Net          1216.25 ml   Filed Weights   06/19/16 1743  Weight: 64 kg (141 lb)    Examination:  General exam: Appears calm and comfortable  Respiratory system: diminished, no wheezing. Respiratory effort normal. Cardiovascular system: S1 & S2 heard, RRR. No JVD, murmurs, rubs, gallops or clicks. No pedal edema. Gastrointestinal system: Abdomen is nondistended, soft and nontender. No organomegaly or masses felt. Normal bowel sounds heard. Central nervous system: Alert and oriented. No focal neurological deficits. .     Data Reviewed: I have personally reviewed following labs and imaging studies  CBC:  Recent Labs Lab 06/19/16 2044 06/19/16 2127 06/20/16 0256  WBC  --  8.9 9.0  NEUTROABS  --  6.5 6.9  HGB 10.2* 9.6* 7.1*  HCT 30.0* 29.9* 22.1*  MCV  --  90.6 89.8  PLT  --  500* 643*   Basic Metabolic Panel:  Recent Labs Lab 06/19/16 2044 06/19/16 2127 06/20/16 0059 06/20/16 0256  NA 137 137  --  139  K 2.8* 2.8*  --  3.0*  CL 91* 91*  --  101  CO2  --  33*  --  29  GLUCOSE 96 88  --  116*  BUN 18 13  --  12  CREATININE 2.10* 1.96*  --  1.75*  CALCIUM  --  8.6*  --  7.4*  MG  --   --  1.9  --    GFR: Estimated Creatinine Clearance: 26.1 mL/min (by C-G formula based on SCr of 1.75 mg/dL (H)). Liver Function Tests:  Recent Labs Lab 06/20/16 0256  AST 21  ALT 13*  ALKPHOS 60  BILITOT 1.0  PROT 5.1*  ALBUMIN 1.7*   No results for input(s): LIPASE, AMYLASE in the last 168 hours. No results for input(s): AMMONIA in the last 168  hours. Coagulation Profile: No results for input(s): INR, PROTIME in the last 168 hours. Cardiac Enzymes: No results for input(s): CKTOTAL, CKMB, CKMBINDEX, TROPONINI in the last 168 hours. BNP (last 3 results) No results for input(s): PROBNP in the last 8760 hours. HbA1C: No results for input(s): HGBA1C in the last 72 hours. CBG: No results for input(s): GLUCAP in the last 168 hours. Lipid Profile: No results for input(s): CHOL, HDL, LDLCALC, TRIG, CHOLHDL, LDLDIRECT in the last 72 hours. Thyroid Function Tests: No results for input(s): TSH, T4TOTAL, FREET4, T3FREE, THYROIDAB in the last 72 hours. Anemia Panel:  Recent Labs  06/20/16 0256  VITAMINB12 730  FOLATE 8.7  FERRITIN 316*  TIBC 137*  IRON 9*  RETICCTPCT 1.3   Urine analysis:    Component Value Date/Time   COLORURINE STRAW (A) 06/20/2016 0129   APPEARANCEUR CLEAR 06/20/2016 0129   LABSPEC 1.008 06/20/2016 0129   PHURINE 7.0 06/20/2016 0129   GLUCOSEU NEGATIVE 06/20/2016 0129   GLUCOSEU NEGATIVE 06/03/2014 1038   HGBUR MODERATE (A) 06/20/2016 0129   BILIRUBINUR NEGATIVE 06/20/2016 0129   KETONESUR NEGATIVE 06/20/2016 0129   PROTEINUR NEGATIVE 06/20/2016 0129   UROBILINOGEN 0.2 06/03/2014 1038   NITRITE NEGATIVE 06/20/2016 0129   LEUKOCYTESUR TRACE (A) 06/20/2016 0129     )No results found for this or any previous visit (from the past 240 hour(s)).    Anti-infectives    Start     Dose/Rate Route Frequency Ordered Stop   06/21/16 0000  azithromycin (ZITHROMAX) 500 mg in dextrose 5 % 250 mL IVPB     500 mg 250 mL/hr over 60 Minutes Intravenous Every 24 hours 06/20/16 0056 06/26/16 2359   06/20/16 2200  cefTRIAXone (ROCEPHIN) 1 g in dextrose 5 % 50 mL IVPB     1 g 100 mL/hr over 30 Minutes Intravenous Every 24 hours 06/20/16 0056 06/26/16 2159   06/20/16 0100  cefTRIAXone (ROCEPHIN) 1 g in dextrose 5 % 50 mL IVPB     1 g 100 mL/hr over 30 Minutes Intravenous  Once 06/20/16 0059 06/20/16 0151   06/20/16  0100  azithromycin (ZITHROMAX) 500 mg in dextrose 5 % 250 mL IVPB     500 mg 250 mL/hr over 60 Minutes Intravenous  Once 06/20/16 0059 06/20/16 0326       Radiology Studies: Dg Chest 2 View  Result Date: 06/19/2016 CLINICAL DATA:  71 y/o  F; COPD.  Shortness of breath. EXAM: CHEST  2 VIEW COMPARISON:  06/08/2016 chest radiograph. CT chest dated 12/06/2010. FINDINGS:  Stable normal cardiac silhouette. Diffuse coarse pulmonary markings compatible with scarring. Increased prominence of pulmonary markings in the lung base. No pleural effusion. No pneumothorax. Mild reverse S curvature of the spine. Partially visualized anterior cervical fusion hardware. IMPRESSION: Scarring in the lungs bilaterally. Increased prominence of lung markings in the lung bases may represent developing bronchitis or infiltrate. Electronically Signed   By: Kristine Garbe M.D.   On: 06/19/2016 23:53   US Renal  Result Date: 06/20/2016 CLINICAL DATA:  Acute renal injury, elevated creatinine. EXAM: RENAL / URINARY TRACT ULTRASOUND COMPLETE COMPARISON:  08/22/2008 CT FINDINGS: Right Kidney: Length: 11.3 cm. No focal renal mass. Small extrarenal pelvis. Cortical-medullary distinction is maintained. Left Kidney: Length: 11.9 cm. Slight increase in echogenicity of the renal cortex of the cortical-medullary distinction is still maintained. No mass or hydronephrosis visualized. Bladder: Appears normal for degree of bladder distention. Bilateral ureteral jets are noted consistent with patency of the ureters. IMPRESSION: Slight increased left renal echogenicity which may reflect early medical renal disease. No obstructive uropathy or nephrolithiasis. Electronically Signed   By: Ashley Royalty M.D.   On: 06/20/2016 00:06        Scheduled Meds: . sodium chloride   Intravenous Once  . aspirin EC  81 mg Oral Daily  . atorvastatin  10 mg Oral Daily  . [START ON 06/21/2016] azithromycin  500 mg Intravenous Q24H  . cefTRIAXone  (ROCEPHIN)  IV  1 g Intravenous Q24H  . heparin  5,000 Units Subcutaneous Q8H  . pantoprazole  40 mg Oral Daily  . sodium chloride  2 spray Each Nare TID  . sodium chloride flush  3 mL Intravenous Q12H   Continuous Infusions: . 0.9 % NaCl with KCl 20 mEq / L 75 mL/hr at 06/20/16 0147     LOS: 0 days    Time spent: 24 min    [place], DO [facility] Pager [phone]  If 7PM-7AM, please contact night-coverage [email or web address] Password [facility] 06/20/2016, 11:39 AM

## 2016-06-21 LAB — TYPE AND SCREEN
ABO/RH(D): A POS
Antibody Screen: NEGATIVE
Unit division: 0
Unit division: 0

## 2016-06-21 LAB — BASIC METABOLIC PANEL
Anion gap: 10 (ref 5–15)
BUN: 8 mg/dL (ref 6–20)
CALCIUM: 8.2 mg/dL — AB (ref 8.9–10.3)
CHLORIDE: 102 mmol/L (ref 101–111)
CO2: 27 mmol/L (ref 22–32)
CREATININE: 1.65 mg/dL — AB (ref 0.44–1.00)
GFR calc non Af Amer: 30 mL/min — ABNORMAL LOW (ref >60)
GFR, EST AFRICAN AMERICAN: 35 mL/min — AB (ref >60)
Glucose, Bld: 85 mg/dL (ref 65–99)
Potassium: 3.2 mmol/L — ABNORMAL LOW (ref 3.5–5.1)
SODIUM: 139 mmol/L (ref 135–145)

## 2016-06-21 LAB — CBC
HCT: 33.1 % — ABNORMAL LOW (ref 36.0–46.0)
Hemoglobin: 10.7 g/dL — ABNORMAL LOW (ref 12.0–15.0)
MCH: 28.1 pg (ref 26.0–34.0)
MCHC: 32.3 g/dL (ref 30.0–36.0)
MCV: 86.9 fL (ref 78.0–100.0)
Platelets: 448 K/uL — ABNORMAL HIGH (ref 150–400)
RBC: 3.81 MIL/uL — ABNORMAL LOW (ref 3.87–5.11)
RDW: 17.1 % — ABNORMAL HIGH (ref 11.5–15.5)
WBC: 10.6 K/uL — ABNORMAL HIGH (ref 4.0–10.5)

## 2016-06-21 LAB — TSH: TSH: 2.127 u[IU]/mL (ref 0.350–4.500)

## 2016-06-21 LAB — HAPTOGLOBIN: Haptoglobin: 519 mg/dL — ABNORMAL HIGH (ref 34–200)

## 2016-06-21 MED ORDER — POTASSIUM CHLORIDE CRYS ER 20 MEQ PO TBCR
40.0000 meq | EXTENDED_RELEASE_TABLET | ORAL | Status: DC
  Administered 2016-06-21: 40 meq via ORAL
  Filled 2016-06-21: qty 2

## 2016-06-21 MED ORDER — BOOST / RESOURCE BREEZE PO LIQD
1.0000 | Freq: Three times a day (TID) | ORAL | Status: DC
  Administered 2016-06-21 – 2016-06-22 (×3): 1 via ORAL

## 2016-06-21 MED ORDER — AZITHROMYCIN 500 MG PO TABS
500.0000 mg | ORAL_TABLET | Freq: Every day | ORAL | Status: DC
  Administered 2016-06-22 – 2016-06-23 (×2): 500 mg via ORAL
  Filled 2016-06-21 (×2): qty 1

## 2016-06-21 NOTE — Progress Notes (Signed)
Initial Nutrition Assessment  DOCUMENTATION CODES:   Not applicable  INTERVENTION:   -Boost Breeze po TID, each supplement provides 250 kcal and 9 grams of protein  NUTRITION DIAGNOSIS:   Inadequate oral intake related to poor appetite as evidenced by percent weight loss, per patient/family report.  GOAL:   Patient will meet greater than or equal to 90% of their needs  MONITOR:   PO intake, Supplement acceptance, Labs, Weight trends, Skin, I & O's  REASON FOR ASSESSMENT:   Consult, Rounds Assessment of nutrition requirement/status  ASSESSMENT:   [name] is a 71 y.o. female with medical history significant of allergic rhinitis, anxiety, COPD, diverticulosis, esophageal diverticulum, GERD, esophageal stricture, hiatal hernia, hyperlipidemia, hypertension, IBS, osteopenia who is coming to the emergency department with complaints of low blood pressure for the past month.   Pt admitted with AKI.   Case discussed with RN, who report pt is requesting RD consult related to weight loss. Per MD notes, wt loss concerning for malignancy.   Spoke with pt at bedside, who reports a general decline in the last 3 months. She shares that she has felt more weak and has been consuming 3 meals per day, but consumes at least 50% of what she used to (pt shares she will eat a piece of sausage and one egg for a meal). She reports that she has experienced a lot of taste changes related to increased mucous production, which has offput her from eating more food. She estimates that she has lost 20# within the past 2-3 months. Per documented wt hx, pt has experienced a 17.5% wt loss over the past 5 months, which is significant for time frame.   Noted meal completion 60-75%.  Nutrition-Focused physical exam completed. Findings are no fat depletion, mild muscle depletion, and no edema.   Pt reports that she has considered trying nutritional supplements, but thinks that she would not tolerate Ensure  or Boost well secondary to mucous production. She also experiences nausea with hot food odors. Pt amenable to try Boost Breeze. Also discussed menu items that were cold and produced less food odors to help combat nausea.   Labs reviewed: K: 3.2 (on IV supplementation).   Diet Order:  Diet Heart Room service appropriate? Yes; Fluid consistency: Thin  Skin:  Reviewed, no issues  Last BM:  06/19/16  Height:   Ht Readings from Last 1 Encounters:  06/19/16 5\' 3"  (1.6 m)    Weight:   Wt Readings from Last 1 Encounters:  06/19/16 141 lb (64 kg)    Ideal Body Weight:  52.3 kg  BMI:  Body mass index is 24.98 kg/m.  Estimated Nutritional Needs:   Kcal:  1700-1900  Protein:  75-90 grams  Fluid:  1.7-1.9 L  EDUCATION NEEDS:   Education needs addressed  Kirston Luty A. Jimmye Norman, RD, LDN, CDE Pager: [phone] After hours Pager: [phone]

## 2016-06-21 NOTE — Progress Notes (Signed)
PROGRESS NOTE    [name]  XKG:[id number] DOB: 12-31-1945 DOA: 06/19/2016 PCP: Merrilee Seashore, MD   Outpatient Specialists:     Brief Narrative:  [name] is a 71 y.o. female with medical history significant of allergic rhinitis, anxiety, COPD, diverticulosis, esophageal diverticulum, GERD, esophageal stricture, hiatal hernia, hyperlipidemia, hypertension, IBS, osteopenia who is coming to the emergency department with complaints of low blood pressure for the past month.   She states that about a month ago she stopped her hydrochlorothiazide and amlodipine due to dizziness and weakness. However, she states that her symptoms have persisted. She complains of anorexia, insomnia, dyspnea, fatigue, night sweats, but not sure she has had a fever.    Assessment & Plan:   Principal Problem:   AKI (acute kidney injury) ([place]) Active Problems:   Hyperlipidemia   Essential hypertension   GERD   COPD (chronic obstructive pulmonary disease) (HCC)   Hypokalemia   Anemia   Community acquired pneumonia   Fever suspect due to PNA -flu negative -xray suspicious of PNA -IV abx- fever improving  Unintentional weight loss- worrisome for maligancy -CT chest ordered with out contrast due to AKI (last CT scan had nodular densities): new subpleural nodular focus in the RIGHT upper lobe 15 x 10 mm, question progressive scarring versus developing nodule since previous exam.  Either short-term follow-up CT imaging to establish stability or assessment by PET-CT recommended to exclude neoplasm RIGHT upper lobe.  AKI -IVF and recheck -2017 baseline < 1 -renal U/S shows early medical renal disease  Hypokalemia -replete -Mg ok  Anemia -heme test stools -patient states up to date on colonoscopy -s/p 2 units PRBC      DVT prophylaxis:  SQ Heparin  Code Status: Full Code   Family Communication: patient  Disposition Plan:     Consultants:         Subjective: Appetite better Some nausea yesterday  Objective: Vitals:   06/20/16 2035 06/20/16 2100 06/21/16 0020 06/21/16 0405  BP: (!) 94/46 (!) 92/51 (!) 110/42 (!) 135/52  Pulse: 69 69 87 83  Resp: 18 16 18 18   Temp: 98.4 F (36.9 C) 98.8 F (37.1 C) 98.9 F (37.2 C) 98.8 F (37.1 C)  TempSrc: Oral Oral Oral Oral  SpO2: 100% 100% 96% 99%  Weight:      Height:        Intake/Output Summary (Last 24 hours) at 06/21/16 1143 Last data filed at 06/21/16 0730  Gross per 24 hour  Intake              812 ml  Output              700 ml  Net              112 ml   Filed Weights   06/19/16 1743  Weight: 64 kg (141 lb)    Examination:  General exam: Appears calm and comfortable  Respiratory system: diminished, no wheezing. Respiratory effort normal. Cardiovascular system: S1 & S2 heard, RRR. No JVD, murmurs, rubs, gallops or clicks. No pedal edema. Gastrointestinal system: Abdomen is nondistended, soft and nontender. No organomegaly or masses felt. Normal bowel sounds heard. Central nervous system: Alert and oriented. No focal neurological deficits. .     Data Reviewed: I have personally reviewed following labs and imaging studies  CBC:  Recent Labs Lab 06/19/16 2044 06/19/16 2127 06/20/16 0256 06/20/16 1205 06/21/16 0847  WBC  --  8.9 9.0 9.2 10.6*  NEUTROABS  --  6.5 6.9  --   --   HGB 10.2* 9.6* 7.1* 7.5* 10.7*  HCT 30.0* 29.9* 22.1* 23.6* 33.1*  MCV  --  90.6 89.8 90.1 86.9  PLT  --  500* 409* 456* 742*   Basic Metabolic Panel:  Recent Labs Lab 06/19/16 2044 06/19/16 2127 06/20/16 0059 06/20/16 0256 06/20/16 1205 06/21/16 0847  NA 137 137  --  139 138 139  K 2.8* 2.8*  --  3.0* 2.9* 3.2*  CL 91* 91*  --  101 101 102  CO2  --  33*  --  29 29 27   GLUCOSE 96 88  --  116* 93 85  BUN 18 13  --  12 11 8   CREATININE 2.10* 1.96*  --  1.75* 1.60* 1.65*  CALCIUM  --  8.6*  --  7.4* 7.9* 8.2*  MG  --   --  1.9  --   --   --     GFR: Estimated Creatinine Clearance: 27.7 mL/min (by C-G formula based on SCr of 1.65 mg/dL (H)). Liver Function Tests:  Recent Labs Lab 06/20/16 0256  AST 21  ALT 13*  ALKPHOS 60  BILITOT 1.0  PROT 5.1*  ALBUMIN 1.7*   No results for input(s): LIPASE, AMYLASE in the last 168 hours. No results for input(s): AMMONIA in the last 168 hours. Coagulation Profile: No results for input(s): INR, PROTIME in the last 168 hours. Cardiac Enzymes: No results for input(s): CKTOTAL, CKMB, CKMBINDEX, TROPONINI in the last 168 hours. BNP (last 3 results) No results for input(s): PROBNP in the last 8760 hours. HbA1C: No results for input(s): HGBA1C in the last 72 hours. CBG: No results for input(s): GLUCAP in the last 168 hours. Lipid Profile: No results for input(s): CHOL, HDL, LDLCALC, TRIG, CHOLHDL, LDLDIRECT in the last 72 hours. Thyroid Function Tests: No results for input(s): TSH, T4TOTAL, FREET4, T3FREE, THYROIDAB in the last 72 hours. Anemia Panel:  Recent Labs  06/20/16 0256  VITAMINB12 730  FOLATE 8.7  FERRITIN 316*  TIBC 137*  IRON 9*  RETICCTPCT 1.3   Urine analysis:    Component Value Date/Time   COLORURINE STRAW (A) 06/20/2016 0129   APPEARANCEUR CLEAR 06/20/2016 0129   LABSPEC 1.008 06/20/2016 0129   PHURINE 7.0 06/20/2016 0129   GLUCOSEU NEGATIVE 06/20/2016 0129   GLUCOSEU NEGATIVE 06/03/2014 1038   HGBUR MODERATE (A) 06/20/2016 0129   BILIRUBINUR NEGATIVE 06/20/2016 0129   KETONESUR NEGATIVE 06/20/2016 0129   PROTEINUR NEGATIVE 06/20/2016 0129   UROBILINOGEN 0.2 06/03/2014 1038   NITRITE NEGATIVE 06/20/2016 0129   LEUKOCYTESUR TRACE (A) 06/20/2016 0129     )No results found for this or any previous visit (from the past 240 hour(s)).    Anti-infectives    Start     Dose/Rate Route Frequency Ordered Stop   06/21/16 0000  azithromycin (ZITHROMAX) 500 mg in dextrose 5 % 250 mL IVPB     500 mg 250 mL/hr over 60 Minutes Intravenous Every 24 hours  06/20/16 0056 06/26/16 2359   06/20/16 2200  cefTRIAXone (ROCEPHIN) 1 g in dextrose 5 % 50 mL IVPB     1 g 100 mL/hr over 30 Minutes Intravenous Every 24 hours 06/20/16 0056 06/26/16 2159   06/20/16 0100  cefTRIAXone (ROCEPHIN) 1 g in dextrose 5 % 50 mL IVPB     1 g 100 mL/hr over 30 Minutes Intravenous  Once 06/20/16 0059 06/20/16 0151   06/20/16 0100  azithromycin (ZITHROMAX) 500 mg in dextrose 5 %  250 mL IVPB     500 mg 250 mL/hr over 60 Minutes Intravenous  Once 06/20/16 0059 06/20/16 0326       Radiology Studies: Dg Chest 2 View  Result Date: 06/19/2016 CLINICAL DATA:  71 y/o  F; COPD.  Shortness of breath. EXAM: CHEST  2 VIEW COMPARISON:  06/08/2016 chest radiograph. CT chest dated 12/06/2010. FINDINGS: Stable normal cardiac silhouette. Diffuse coarse pulmonary markings compatible with scarring. Increased prominence of pulmonary markings in the lung base. No pleural effusion. No pneumothorax. Mild reverse S curvature of the spine. Partially visualized anterior cervical fusion hardware. IMPRESSION: Scarring in the lungs bilaterally. Increased prominence of lung markings in the lung bases may represent developing bronchitis or infiltrate. Electronically Signed   By: Kristine Garbe M.D.   On: 06/19/2016 23:53   Ct Chest Wo Contrast  Result Date: 06/20/2016 CLINICAL DATA:  Fever, fatigue, COPD EXAM: CT CHEST WITHOUT CONTRAST TECHNIQUE: Multidetector CT imaging of the chest was performed following the standard protocol without IV contrast. Sagittal and coronal MPR images reconstructed from axial data set. COMPARISON:  12/06/2010; correlation chest radiograph 06/19/2016 FINDINGS: Cardiovascular: Atherosclerotic calcifications aorta and minimally in coronary arteries. Aorta normal caliber. No pericardial effusion. Mediastinum/Nodes: No thoracic adenopathy. Small hiatal hernia. Questionable wall thickening of the proximal to mid esophagus versus artifact from underdistention. 8 mm RIGHT  thyroid nodule. Questionable tiny LEFT thyroid nodule. Lungs/Pleura: Postsurgical changes LEFT lung with additional areas of atelectasis versus scarring at LEFT base increased since previous exam. Chronic non solid opacity in the RIGHT lower lobe 2.1 x 1.5 cm image 63 previously 2.4 x 1.8 cm. Additional central areas of parenchymal opacity versus scarring in the superior segment of the RIGHT lower lobe and lateral aspect of the RIGHT upper lobe unchanged. Progressive subpleural opacity in the RIGHT upper lobe 15 x 10 mm image 37. Upper Abdomen: Unremarkable Musculoskeletal: Demineralized without acute osseous findings. IMPRESSION: Postsurgical changes LEFT lung with scattered areas of scarring in both lungs including a non solid opacity in the RIGHT lower lobe 2.1 x 1.5 cm. Additional areas of central lung scarring and bibasilar atelectasis versus scarring with a new subpleural nodular focus in the RIGHT upper lobe 15 x 10 mm, question progressive scarring versus developing nodule since previous exam. Either short-term follow-up CT imaging to establish stability or assessment by PET-CT recommended to exclude neoplasm RIGHT upper lobe. Aortic atherosclerosis. Electronically Signed   By: Lavonia Dana M.D.   On: 06/20/2016 16:58   US Renal  Result Date: 06/20/2016 CLINICAL DATA:  Acute renal injury, elevated creatinine. EXAM: RENAL / URINARY TRACT ULTRASOUND COMPLETE COMPARISON:  08/22/2008 CT FINDINGS: Right Kidney: Length: 11.3 cm. No focal renal mass. Small extrarenal pelvis. Cortical-medullary distinction is maintained. Left Kidney: Length: 11.9 cm. Slight increase in echogenicity of the renal cortex of the cortical-medullary distinction is still maintained. No mass or hydronephrosis visualized. Bladder: Appears normal for degree of bladder distention. Bilateral ureteral jets are noted consistent with patency of the ureters. IMPRESSION: Slight increased left renal echogenicity which may reflect early medical  renal disease. No obstructive uropathy or nephrolithiasis. Electronically Signed   By: Ashley Royalty M.D.   On: 06/20/2016 00:06        Scheduled Meds: . aspirin EC  81 mg Oral Daily  . atorvastatin  10 mg Oral Daily  . azithromycin  500 mg Intravenous Q24H  . cefTRIAXone (ROCEPHIN)  IV  1 g Intravenous Q24H  . heparin  5,000 Units Subcutaneous Q8H  . pantoprazole  40 mg Oral Daily  . sodium chloride  2 spray Each Nare TID  . sodium chloride flush  3 mL Intravenous Q12H   Continuous Infusions: . 0.9 % NaCl with KCl 20 mEq / L 75 mL/hr at 06/21/16 0045     LOS: 1 day    Time spent: 25 min    [place], DO [facility] Pager [phone]  If 7PM-7AM, please contact night-coverage [email or web address] Password [facility] 06/21/2016, 11:43 AM

## 2016-06-22 DIAGNOSIS — J449 Chronic obstructive pulmonary disease, unspecified: Secondary | ICD-10-CM

## 2016-06-22 LAB — BASIC METABOLIC PANEL
Anion gap: 10 (ref 5–15)
BUN: 6 mg/dL (ref 6–20)
CHLORIDE: 103 mmol/L (ref 101–111)
CO2: 26 mmol/L (ref 22–32)
Calcium: 8 mg/dL — ABNORMAL LOW (ref 8.9–10.3)
Creatinine, Ser: 1.63 mg/dL — ABNORMAL HIGH (ref 0.44–1.00)
GFR calc non Af Amer: 30 mL/min — ABNORMAL LOW (ref >60)
GFR, EST AFRICAN AMERICAN: 35 mL/min — AB (ref >60)
GLUCOSE: 90 mg/dL (ref 65–99)
Potassium: 3.8 mmol/L (ref 3.5–5.1)
SODIUM: 139 mmol/L (ref 135–145)

## 2016-06-22 LAB — CBC
HEMATOCRIT: 31.7 % — AB (ref 36.0–46.0)
Hemoglobin: 10.1 g/dL — ABNORMAL LOW (ref 12.0–15.0)
MCH: 28.1 pg (ref 26.0–34.0)
MCHC: 31.9 g/dL (ref 30.0–36.0)
MCV: 88.1 fL (ref 78.0–100.0)
PLATELETS: 420 10*3/uL — AB (ref 150–400)
RBC: 3.6 MIL/uL — ABNORMAL LOW (ref 3.87–5.11)
RDW: 17 % — AB (ref 11.5–15.5)
WBC: 9 10*3/uL (ref 4.0–10.5)

## 2016-06-22 NOTE — Progress Notes (Signed)
PROGRESS NOTE    [name]  TWS:[id number] DOB: [birth year] DOA: 06/19/2016 PCP: Merrilee Seashore, MD   Outpatient Specialists:     Brief Narrative:  [name] is a 71 y.o. female with medical history significant of allergic [name], [name], [name], [name], [name], [name], [name], [name], [name], [name], [name], [name].   She states that about a [name] ago she stopped her hydrochlorothiazide and amlodipine due to dizziness and weakness. However, she states that her symptoms have persisted. She complains of anorexia, insomnia, dyspnea, fatigue, night sweats, but not sure she has had a fever.    Assessment & Plan:   Principal Problem:   AKI (acute kidney injury) ([place]) Active Problems:   [name]   Essential [name]   [name]   [name] (chronic obstructive pulmonary disease) (HCC)   Hypokalemia   Anemia   Community acquired pneumonia   Fever suspect due to PNA -flu negative -xray suspicious of PNA -IV abx- fever improving- once afebrile plan to d/c on PO abx  Unintentional weight loss- worrisome for maligancy -CT chest ordered with out contrast due to AKI (last CT scan had nodular densities): new subpleural nodular focus in the RIGHT upper lobe 15 x 10 mm, question progressive scarring versus developing nodule since previous exam.  Either short-term follow-up CT imaging to establish stability or assessment by PET-CT recommended to exclude neoplasm RIGHT upper lobe- has seen Dr. Katherine Basset in the past-- will need appointment with him  AKI reheck in AM -2017 baseline < 1 -renal U/S shows early medical renal disease  Hypokalemia -replete -Mg ok  Anemia -heme test stools pending -patient states up to date on colonoscopy -s/p 2 units PRBC      DVT prophylaxis:  SQ Heparin  Code Status: Full  Code   Family Communication: patient  Disposition Plan:  Home in AM?   Consultants:        Subjective: Taste coming back  Objective: Vitals:   06/21/16 2057 06/22/16 0633 06/22/16 0931 06/22/16 1223  BP: 136/61 (!) 115/54 (!) 96/52 107/60  Pulse: 78 89 64 64  Resp:  18  18  Temp: 98.1 F (36.7 C) (!) 100.6 F (38.1 C) 97.5 F (36.4 C) 98.1 F (36.7 C)  TempSrc: Oral Oral Axillary Oral  SpO2: 99% 97% 100% 99%  Weight:      Height:        Intake/Output Summary (Last 24 hours) at 06/22/16 1623 Last data filed at 06/22/16 0017  Gross per 24 hour  Intake             3735 ml  Output             1300 ml  Net             2435 ml   Filed Weights   06/19/16 1743  Weight: 64 kg (141 lb)    Examination:  General exam: Appears calm and comfortable  Respiratory system: diminished, no wheezing. Respiratory effort normal. Cardiovascular system: S1 & S2 heard, RRR. No JVD, murmurs, rubs, gallops or clicks. No pedal edema. Gastrointestinal system: Abdomen is nondistended, soft and nontender. No organomegaly or masses felt. Normal bowel sounds heard. Central nervous system: Alert and oriented. No focal neurological deficits. .     Data Reviewed: I have personally reviewed following labs and imaging studies  CBC:  Recent Labs Lab 06/19/16 2127 06/20/16 0256 06/20/16 1205  06/21/16 0847 06/22/16 0623  WBC 8.9 9.0 9.2 10.6* 9.0  NEUTROABS 6.5 6.9  --   --   --   HGB 9.6* 7.1* 7.5* 10.7* 10.1*  HCT 29.9* 22.1* 23.6* 33.1* 31.7*  MCV 90.6 89.8 90.1 86.9 88.1  PLT 500* 409* 456* 448* 761*   Basic Metabolic Panel:  Recent Labs Lab 06/19/16 2127 06/20/16 0059 06/20/16 0256 06/20/16 1205 06/21/16 0847 06/22/16 0623  NA 137  --  139 138 139 139  K 2.8*  --  3.0* 2.9* 3.2* 3.8  CL 91*  --  101 101 102 103  CO2 33*  --  29 29 27 26   GLUCOSE 88  --  116* 93 85 90  BUN 13  --  12 11 8 6   CREATININE 1.96*  --  1.75* 1.60* 1.65* 1.63*  CALCIUM 8.6*  --  7.4*  7.9* 8.2* 8.0*  MG  --  1.9  --   --   --   --    GFR: Estimated Creatinine Clearance: 28.1 mL/min (by C-G formula based on SCr of 1.63 mg/dL (H)). Liver Function Tests:  Recent Labs Lab 06/20/16 0256  AST 21  ALT 13*  ALKPHOS 60  BILITOT 1.0  PROT 5.1*  ALBUMIN 1.7*   No results for input(s): LIPASE, AMYLASE in the last 168 hours. No results for input(s): AMMONIA in the last 168 hours. Coagulation Profile: No results for input(s): INR, PROTIME in the last 168 hours. Cardiac Enzymes: No results for input(s): CKTOTAL, CKMB, CKMBINDEX, TROPONINI in the last 168 hours. BNP (last 3 results) No results for input(s): PROBNP in the last 8760 hours. HbA1C: No results for input(s): HGBA1C in the last 72 hours. CBG: No results for input(s): GLUCAP in the last 168 hours. Lipid Profile: No results for input(s): CHOL, HDL, LDLCALC, TRIG, CHOLHDL, LDLDIRECT in the last 72 hours. Thyroid Function Tests:  Recent Labs  06/21/16 1334  TSH 2.127   Anemia Panel:  Recent Labs  06/20/16 0256  VITAMINB12 730  FOLATE 8.7  FERRITIN 316*  TIBC 137*  IRON 9*  RETICCTPCT 1.3   Urine analysis:    Component Value Date/Time   COLORURINE STRAW (A) 06/20/2016 0129   APPEARANCEUR CLEAR 06/20/2016 0129   LABSPEC 1.008 06/20/2016 0129   PHURINE 7.0 06/20/2016 0129   GLUCOSEU NEGATIVE 06/20/2016 0129   GLUCOSEU NEGATIVE 06/03/2014 1038   HGBUR MODERATE (A) 06/20/2016 0129   BILIRUBINUR NEGATIVE 06/20/2016 0129   KETONESUR NEGATIVE 06/20/2016 0129   PROTEINUR NEGATIVE 06/20/2016 0129   UROBILINOGEN 0.2 06/03/2014 1038   NITRITE NEGATIVE 06/20/2016 0129   LEUKOCYTESUR TRACE (A) 06/20/2016 0129     ) Recent Results (from the past 240 hour(s))  Culture, blood (routine x 2) Call MD if unable to obtain prior to antibiotics being given     Status: None (Preliminary result)   Collection Time: 06/20/16 12:59 AM  Result Value Ref Range Status   Specimen Description BLOOD LEFT ANTECUBITAL   Final   Special Requests BOTTLES DRAWN AEROBIC AND ANAEROBIC 5CC EA  Final   Culture NO GROWTH 2 DAYS  Final   Report Status PENDING  Incomplete  Culture, blood (routine x 2) Call MD if unable to obtain prior to antibiotics being given     Status: None (Preliminary result)   Collection Time: 06/20/16 12:59 AM  Result Value Ref Range Status   Specimen Description BLOOD BLOOD LEFT HAND  Final   Special Requests IN PEDIATRIC BOTTLE [place]  Final  Culture NO GROWTH 2 DAYS  Final   Report Status PENDING  Incomplete      Anti-infectives    Start     Dose/Rate Route Frequency Ordered Stop   06/21/16 1700  azithromycin (ZITHROMAX) tablet 500 mg     500 mg Oral Daily 06/21/16 1550 06/27/16 0959   06/21/16 0000  azithromycin (ZITHROMAX) 500 mg in dextrose 5 % 250 mL IVPB  Status:  Discontinued     500 mg 250 mL/hr over 60 Minutes Intravenous Every 24 hours 06/20/16 0056 06/21/16 1550   06/20/16 2200  cefTRIAXone (ROCEPHIN) 1 g in dextrose 5 % 50 mL IVPB     1 g 100 mL/hr over 30 Minutes Intravenous Every 24 hours 06/20/16 0056 06/26/16 2159   06/20/16 0100  cefTRIAXone (ROCEPHIN) 1 g in dextrose 5 % 50 mL IVPB     1 g 100 mL/hr over 30 Minutes Intravenous  Once 06/20/16 0059 06/20/16 0151   06/20/16 0100  azithromycin (ZITHROMAX) 500 mg in dextrose 5 % 250 mL IVPB     500 mg 250 mL/hr over 60 Minutes Intravenous  Once 06/20/16 0059 06/20/16 0326       Radiology Studies: Ct Chest Wo Contrast  Result Date: 06/20/2016 CLINICAL DATA:  Fever, fatigue, [name] EXAM: CT CHEST WITHOUT CONTRAST TECHNIQUE: Multidetector CT imaging of the chest was performed following the standard protocol without IV contrast. Sagittal and coronal MPR images reconstructed from axial data set. COMPARISON:  12/06/2010; correlation chest radiograph 06/19/2016 FINDINGS: Cardiovascular: Atherosclerotic calcifications aorta and minimally in coronary arteries. Aorta normal caliber. No pericardial effusion. Mediastinum/Nodes: No  thoracic adenopathy. Small [name]. Questionable wall thickening of the proximal to mid esophagus versus artifact from underdistention. 8 mm RIGHT thyroid nodule. Questionable tiny LEFT thyroid nodule. Lungs/Pleura: Postsurgical changes LEFT lung with additional areas of atelectasis versus scarring at LEFT base increased since previous exam. Chronic non solid opacity in the RIGHT lower lobe 2.1 x 1.5 cm image 63 previously 2.4 x 1.8 cm. Additional central areas of parenchymal opacity versus scarring in the superior segment of the RIGHT lower lobe and lateral aspect of the RIGHT upper lobe unchanged. Progressive subpleural opacity in the RIGHT upper lobe 15 x 10 mm image 37. Upper Abdomen: Unremarkable Musculoskeletal: Demineralized without acute osseous findings. IMPRESSION: Postsurgical changes LEFT lung with scattered areas of scarring in both lungs including a non solid opacity in the RIGHT lower lobe 2.1 x 1.5 cm. Additional areas of central lung scarring and bibasilar atelectasis versus scarring with a new subpleural nodular focus in the RIGHT upper lobe 15 x 10 mm, question progressive scarring versus developing nodule since previous exam. Either short-term follow-up CT imaging to establish stability or assessment by PET-CT recommended to exclude neoplasm RIGHT upper lobe. Aortic atherosclerosis. Electronically Signed   By: Lavonia Dana M.D.   On: 06/20/2016 16:58        Scheduled Meds: . aspirin EC  81 mg Oral Daily  . atorvastatin  10 mg Oral Daily  . azithromycin  500 mg Oral Daily  . cefTRIAXone (ROCEPHIN)  IV  1 g Intravenous Q24H  . feeding supplement  1 Container Oral TID BM  . heparin  5,000 Units Subcutaneous Q8H  . pantoprazole  40 mg Oral Daily  . sodium chloride  2 spray Each Nare TID  . sodium chloride flush  3 mL Intravenous Q12H   Continuous Infusions: . 0.9 % NaCl with KCl 20 mEq / L 75 mL/hr at 06/22/16 [phone]  LOS: 2 days    Time spent: 25 min    [place], DO [facility] Pager [phone]  If 7PM-7AM, please contact night-coverage [email or web address] Password [facility] 06/22/2016, 4:23 PM

## 2016-06-23 ENCOUNTER — Telehealth: Payer: Self-pay | Admitting: Pulmonary Disease

## 2016-06-23 DIAGNOSIS — I10 Essential (primary) hypertension: Secondary | ICD-10-CM

## 2016-06-23 DIAGNOSIS — J189 Pneumonia, unspecified organism: Secondary | ICD-10-CM

## 2016-06-23 DIAGNOSIS — E785 Hyperlipidemia, unspecified: Secondary | ICD-10-CM

## 2016-06-23 DIAGNOSIS — E876 Hypokalemia: Secondary | ICD-10-CM

## 2016-06-23 DIAGNOSIS — N179 Acute kidney failure, unspecified: Principal | ICD-10-CM

## 2016-06-23 LAB — BASIC METABOLIC PANEL
Anion gap: 11 (ref 5–15)
BUN: 7 mg/dL (ref 6–20)
CO2: 24 mmol/L (ref 22–32)
CREATININE: 1.59 mg/dL — AB (ref 0.44–1.00)
Calcium: 8.2 mg/dL — ABNORMAL LOW (ref 8.9–10.3)
Chloride: 101 mmol/L (ref 101–111)
GFR calc non Af Amer: 31 mL/min — ABNORMAL LOW (ref >60)
GFR, EST AFRICAN AMERICAN: 36 mL/min — AB (ref >60)
Glucose, Bld: 120 mg/dL — ABNORMAL HIGH (ref 65–99)
POTASSIUM: 3.5 mmol/L (ref 3.5–5.1)
Sodium: 136 mmol/L (ref 135–145)

## 2016-06-23 LAB — PROCALCITONIN: Procalcitonin: 0.31 ng/mL

## 2016-06-23 MED ORDER — BOOST / RESOURCE BREEZE PO LIQD: 1.0000 | Freq: Three times a day (TID) | ORAL | Status: DC

## 2016-06-23 MED ORDER — INFLUENZA VAC SPLIT QUAD 0.5 ML IM SUSY
0.5000 mL | PREFILLED_SYRINGE | Freq: Once | INTRAMUSCULAR | Status: DC
  Filled 2016-06-23: qty 0.5

## 2016-06-23 MED ORDER — ACETAMINOPHEN 325 MG PO TABS: 650.0000 mg | ORAL_TABLET | Freq: Four times a day (QID) | ORAL | Status: DC | PRN

## 2016-06-23 MED ORDER — AMOXICILLIN-POT CLAVULANATE 500-125 MG PO TABS: 1.0000 | ORAL_TABLET | Freq: Two times a day (BID) | ORAL | 0 refills | Status: DC

## 2016-06-23 NOTE — Telephone Encounter (Deleted)
    Please call the pt and tell the pt the [place]  showed OSA / did not show OSA.   Pt stops breathing    times an hour.   Home sleep study was done on :   Please order autoCPAP 5-15 cm H2O. Patient will need a mask fitting session. Patient will need a 1 month download.   Patient needs to be seen by me or any of the NPs/APPs  4-6 weeks after obtaining the cpap machine. Let me know if you receive this.   Thanks!   J. Shirl Harris, MD 06/23/2016, 3:39 PM

## 2016-06-23 NOTE — Discharge Instructions (Signed)
Community-Acquired Pneumonia, Adult Pneumonia is an infection of the lungs. There are different types of pneumonia. One type can develop while a person is in a hospital. A different type, called community-acquired pneumonia, develops in people who are not, or have not recently been, in the hospital or other health care facility. What are the causes? Pneumonia may be caused by bacteria, viruses, or funguses. Community-acquired pneumonia is often caused by Streptococcus pneumonia bacteria. These bacteria are often passed from one person to another by breathing in droplets from the cough or sneeze of an infected person. What increases the risk? The condition is more likely to develop in:  People who havechronic diseases, such as chronic obstructive pulmonary disease (COPD), asthma, congestive heart failure, cystic fibrosis, diabetes, or kidney disease.  People who haveearly-stage or late-stage HIV.  People who havesickle cell disease.  People who havehad their spleen removed (splenectomy).  People who havepoor Human resources officer.  People who havemedical conditions that increase the risk of breathing in (aspirating) secretions their own mouth and nose.  People who havea weakened immune system (immunocompromised).  People who smoke.  People whotravel to areas where pneumonia-causing germs commonly exist.  People whoare around animal habitats or animals that have pneumonia-causing germs, including birds, bats, rabbits, cats, and farm animals. What are the signs or symptoms? Symptoms of this condition include:  Adry cough.  A wet (productive) cough.  Fever.  Sweating.  Chest pain, especially when breathing deeply or coughing.  Rapid breathing or difficulty breathing.  Shortness of breath.  Shaking chills.  Fatigue.  Muscle aches. How is this diagnosed? Your health care provider will take a medical history and perform a physical exam. You may also have other tests,  including:  Imaging studies of your chest, including X-rays.  Tests to check your blood oxygen level and other blood gases.  Other tests on blood, mucus (sputum), fluid around your lungs (pleural fluid), and urine. If your pneumonia is severe, other tests may be done to identify the specific cause of your illness. How is this treated? The type of treatment that you receive depends on many factors, such as the cause of your pneumonia, the medicines you take, and other medical conditions that you have. For most adults, treatment and recovery from pneumonia may occur at home. In some cases, treatment must happen in a hospital. Treatment may include:  Antibiotic medicines, if the pneumonia was caused by bacteria.  Antiviral medicines, if the pneumonia was caused by a virus.  Medicines that are given by mouth or through an IV tube.  Oxygen.  Respiratory therapy. Although rare, treating severe pneumonia may include:  Mechanical ventilation. This is done if you are not breathing well on your own and you cannot maintain a safe blood oxygen level.  Thoracentesis. This procedureremoves fluid around one lung or both lungs to help you breathe better. Follow these instructions at home:  Take over-the-counter and prescription medicines only as told by your health care provider.  Only takecough medicine if you are losing sleep. Understand that cough medicine can prevent your bodys natural ability to remove mucus from your lungs.  If you were prescribed an antibiotic medicine, take it as told by your health care provider. Do not stop taking the antibiotic even if you start to feel better.  Sleep in a semi-upright position at night. Try sleeping in a reclining chair, or place a few pillows under your head.  Do not use tobacco products, including cigarettes, chewing tobacco, and e-cigarettes. If you  need help quitting, ask your health care provider.  Drink enough water to keep your urine clear  or pale yellow. This will help to thin out mucus secretions in your lungs. How is this prevented? There are ways that you can decrease your risk of developing community-acquired pneumonia. Consider getting a pneumococcal vaccine if:  You are older than 73 years of age.  You are older than 73 years of age and are undergoing cancer treatment, have chronic lung disease, or have other medical conditions that affect your immune system. Ask your health care provider if this applies to you. There are different types and schedules of pneumococcal vaccines. Ask your health care provider which vaccination option is best for you. You may also prevent community-acquired pneumonia if you take these actions:  Get an influenza vaccine every year. Ask your health care provider which type of influenza vaccine is best for you.  Go to the dentist on a regular basis.  Wash your hands often. Use hand sanitizer if soap and water are not available. Contact a health care provider if:  You have a fever.  You are losing sleep because you cannot control your cough with cough medicine. Get help right away if:  You have worsening shortness of breath.  You have increased chest pain.  Your sickness becomes worse, especially if you are an older adult or have a weakened immune system.  You cough up blood. This information is not intended to replace advice given to you by your health care provider. Make sure you discuss any questions you have with your health care provider. Document Released: 04/25/2005 Document Revised: 09/03/2015 Document Reviewed: 08/20/2014 Elsevier Interactive Patient Education  2017 [employer].  Discharge instructions:  Please get your medications reviewed and adjusted by your Primary MD.  Please request your Primary MD to go over all Hospital Tests and Procedure/Radiological results at the follow up, please get all Hospital records sent to your Prim MD by signing hospital release before  you go home.  If you had Pneumonia of Lung problems at the Hospital: Please get a 2 view Chest X ray done in 6-8 weeks after hospital discharge or sooner if instructed by your Primary MD.  If you have Congestive Heart Failure: Please call your Cardiologist or Primary MD anytime you have any of the following symptoms:  1) 3 pound weight gain in 24 hours or 5 pounds in 1 week  2) shortness of breath, with or without a dry hacking cough  3) swelling in the hands, feet or stomach  4) if you have to sleep on extra pillows at night in order to breathe  Follow cardiac low salt diet and 1.5 lit/day fluid restriction.  If you have diabetes Accuchecks 4 times/day, Once in AM empty stomach and then before each meal. Log in all results and show them to your primary doctor at your next visit. If any glucose reading is under 80 or above 300 call your primary MD immediately.  If you have Seizure/Convulsions/Epilepsy: Please do not drive, operate heavy machinery, participate in activities at heights or participate in high speed sports until you have seen by Primary MD or a Neurologist and advised to do so again.  If you had Gastrointestinal Bleeding: Please ask your Primary MD to check a complete blood count within one week of discharge or at your next visit. Your endoscopic/colonoscopic biopsies that are pending at the time of discharge, will also need to followed by your Primary MD.  Get Medicines  reviewed and adjusted. Please take all your medications with you for your next visit with your Primary MD  Please request your Primary MD to go over all hospital tests and procedure/radiological results at the follow up, please ask your Primary MD to get all Hospital records sent to his/her office.  If you experience worsening of your admission symptoms, develop shortness of breath, life threatening emergency, suicidal or homicidal thoughts you must seek medical attention immediately by calling 911 or  calling your MD immediately  if symptoms less severe.  You must read complete instructions/literature along with all the possible adverse reactions/side effects for all the Medicines you take and that have been prescribed to you. Take any new Medicines after you have completely understood and accpet all the possible adverse reactions/side effects.   Do not drive or operate heavy machinery when taking Pain medications.   Do not take more than prescribed Pain, Sleep and Anxiety Medications  Special Instructions: If you have smoked or chewed Tobacco  in the last 2 yrs please stop smoking, stop any regular Alcohol  and or any Recreational drug use.  Wear Seat belts while driving.  Please note You were cared for by a hospitalist during your hospital stay. If you have any questions about your discharge medications or the care you received while you were in the hospital after you are discharged, you can call the unit and asked to speak with the hospitalist on call if the hospitalist that took care of you is not available. Once you are discharged, your primary care physician will handle any further medical issues. Please note that NO REFILLS for any discharge medications will be authorized once you are discharged, as it is imperative that you return to your primary care physician (or establish a relationship with a primary care physician if you do not have one) for your aftercare needs so that they can reassess your need for medications and monitor your lab values.  You can reach the hospitalist office at phone [phone] or fax [phone]   If you do not have a primary care physician, you can call [phone] for a physician referral.

## 2016-06-23 NOTE — Discharge Summary (Signed)
Physician Discharge Summary  [name] TDD:[id number] DOB: 29-Sep-1945  PCP: Merrilee Seashore, MD  Admit date: 06/19/2016 Discharge date: 06/23/2016  Recommendations for Outpatient Follow-up:  1. Dr. Merrilee Seashore, PCP in 4 days with repeat labs (CBC & BMP). Please follow final blood culture results that were sent from the hospital. 2. Dr. Brand Males, Pulmonology in 1 week. Patient is advised to call on 06/24/16 for an appointment.  Home Health: None Equipment/Devices: None    Discharge Condition: Improved and stable  CODE STATUS: Full  Diet recommendation: Heart healthy diet.  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 Since December 2017, she has been struggling with the symptoms including some postnasal drip, sinus congestion and had been treated by PCP by 3 courses of different antibiotics without significant improvement. She has been evaluated by ENT in the past. She voluntarily stopped her HCTZ and amlodipine due to hypotension recorded at home. She also complained of night sweats and weight loss of about 15 pounds since December 2017. She denied headache, sore throat, cough, chest pain, palpitations, lower extremity edema, abdominal pain, nausea, vomiting, diarrhea, melena or hematochezia. She occasionally gets constipated. She denied hematuria or dysuria. She reports colonoscopy within the last 4  years by Dr. [name] to be unremarkable. In the ED, hemoglobin 9.6, potassium 2.8, creatinine 1.96. She was admitted for acute kidney injury. On the night of admission, she spiked fever of 103F.  Assessment and plan:  1. Possible community-acquired pneumonia complicating underlying pulmonary fibrosis/nodules: She spiked fever of 103F overnight of admission. She gives history of sinus infection and had been treated with 3 courses of different antibiotics without significant relief. She had been seen by outpatient ENT. Empirically started on IV Rocephin and azithromycin of which she has completed 4-5 days. Blood cultures 2 negative to date. No leukocytosis. Flu panel PCR negative. Chest x-ray 2/11 was suspicious for pneumonia. Pro-calcitonin 0.31 several days after antibiotics. High fevers have improved but patient continues to have low-grade intermittent fevers in the 100F range. She has no new symptoms or signs to suggest alternative source of infection. She denies headache or visual symptoms. Reviewed noncontrasted CT chest with a Pulmonologist. At this time we'll transition to oral Augmentin to complete total 10 days of antibiotics. Offered pulmonology consultation in the hospital but patient declined this and wishes to follow-up with her primary pulmonologist as outpatient.  Upon chart review, last seen by pulmonology in 2012 for pulmonary nodule which spontaneously improved/stabilized and was reassuring that were not dealing with malignancy at that time. Extensive workup for Sjogren's was negative. She had been seen by Dr. Madalyn Rob at [facility] with unsure diagnosis. Pathology feels it was related to autoimmune disease but clinical workup was negative. It appeared that she had symptoms of night sweats and chest pain at that time. She was supposed to follow-up with repeat CT chest and may have been lost in follow-up. 2. Unintentional weight loss/abnormal CT chest/concern for malignancy: Noncontrasted CT chest  was  done (no contrast due to acute kidney injury): Detailed report as below. In summary, findings suggestive of lung scarring and pulmonary nodules (right lower lobe 2.1 x 1.5 cm and right upper lobe 15 x 10 mm). Radiologist recommended short-term follow-up with CT or assessment by PET CT to exclude neoplasm right upper lobe. Discussed extensively with patient and advised her to follow-up with her primary pulmonologist to pursue these evaluations. She verbalized understanding. She indicates that she is up to date on her colonoscopies. TSH normal. FOBT was ordered but unfortunately not sent. 3. Acute kidney injury: Presented with creatinine of 1.96. Unclear etiology. Some of it may have been related to prerenal/dehydration. No history of NSAID/ARB use. Renal ultrasound without hydronephrosis. IV fluids. Creatinine has gradually improved to 1.59. Advised her to continue adequate hydration and avoid nephrotoxic medications/agents. Follow-up BMP in a couple days with PCP. Last creatinine prior to admission was on 09/10/15:0.93. 4. Normocytic anemia: Baseline hemoglobin not known but was 12.3 on 09/10/15. Presented with hemoglobin of 9.6 which dropped to 7.1 on 06/20/16 in the absence of overt bleeding. Transfuse 2 units of PRBCs. Hemoglobin improved and has been stable in the 10 g range. Anemia panel: Iron 9, TIBC 137, saturation ratio 7, ferritin 316, folate 8.7, B12: 730 & haptoglobin 519. Follow CBCs in a few days as outpatient. 5. Hypokalemia: Replaced. Magnesium okay. 6. Essential hypertension: Controlled in the hospital off of antihypertensives. Had been hypotensive prior to admission. Discontinued amlodipine and HCTZ during this hospitalization. Close outpatient follow-up. 7. Hyperlipidemia: Continue statins. 8. GERD: Continue PPI.    Consultations None  Procedures None   Discharge Instructions  Discharge Instructions    Call MD for:  difficulty breathing, headache or visual disturbances     Complete by:  As directed    Call MD for:  extreme fatigue    Complete by:  As directed    Call MD for:  persistant dizziness or light-headedness    Complete by:  As directed    Call MD for:  temperature >100.4    Complete by:  As directed    Diet - low sodium heart healthy    Complete by:  As directed    Increase activity slowly    Complete by:  As directed        Medication List    STOP taking these medications   amLODipine 10 MG tablet Commonly known as:  NORVASC   hydrochlorothiazide 12.5 MG tablet Commonly known as:  HYDRODIURIL     TAKE these medications   acetaminophen 325 MG tablet Commonly known as:  TYLENOL Take 2 tablets (650 mg total) by mouth every 6 (six) hours as needed for mild pain, moderate pain, fever or headache.   amoxicillin-clavulanate 500-125 MG tablet Commonly known as:  AUGMENTIN Take 1 tablet (500 mg total) by mouth 2 (two) times daily.   aspirin EC 81 MG tablet Take 81 mg by mouth daily.   atorvastatin 10 MG tablet Commonly known as:  LIPITOR Take 1 tablet (10 mg total) by mouth daily.   feeding supplement Liqd Take 1 Container by mouth 3 (three) times daily between meals.   pantoprazole 40 MG tablet Commonly known as:  PROTONIX Take 1 tablet (40 mg total) by mouth daily.   sodium chloride 0.65 % Soln nasal spray Commonly known as:  OCEAN Place 2 sprays into both nostrils 3 (three) times daily.      Follow-up Information    RAMACHANDRAN,AJITH, MD. Schedule an appointment as soon as  possible for a visit in 4 day(s).   Specialty:  Internal Medicine Why:  To be seen with repeat labs (CBC & BMP). Contact information: [place] [address] [phone]        [facility], MD. Schedule an appointment as soon as possible for a visit in 1 week(s).   Specialty:  Pulmonary Disease Why:  Call 06/24/16 for appointment. Contact information: [place] [address] [phone]           Allergies  Allergen Reactions  . Ace Inhibitors Anaphylaxis    REACTION: angioedema  . Other Anaphylaxis    Calcium Channel Blocking Agent Diltiazem Analogues  . Pneumococcal Vaccines Other (See Comments)    Red rash, Knot (sore and itches), A fever for a couple days (per pt report)  . Tetanus Toxoid Other (See Comments)    Cellulitis in arm       Procedures/Studies: Dg Chest 2 View  Result Date: 06/19/2016 CLINICAL DATA:  71 y/o  F; COPD.  Shortness of breath. EXAM: CHEST  2 VIEW COMPARISON:  06/08/2016 chest radiograph. CT chest dated 12/06/2010. FINDINGS: Stable normal cardiac silhouette. Diffuse coarse pulmonary markings compatible with scarring. Increased prominence of pulmonary markings in the lung base. No pleural effusion. No pneumothorax. Mild reverse S curvature of the spine. Partially visualized anterior cervical fusion hardware. IMPRESSION: Scarring in the lungs bilaterally. Increased prominence of lung markings in the lung bases may represent developing bronchitis or infiltrate. Electronically Signed   By: Kristine Garbe M.D.   On: 06/19/2016 23:53   Ct Chest Wo Contrast  Result Date: 06/20/2016 CLINICAL DATA:  Fever, fatigue, COPD EXAM: CT CHEST WITHOUT CONTRAST TECHNIQUE: Multidetector CT imaging of the chest was performed following the standard protocol without IV contrast. Sagittal and coronal MPR images reconstructed from axial data set. COMPARISON:  12/06/2010; correlation chest radiograph 06/19/2016 FINDINGS: Cardiovascular: Atherosclerotic calcifications aorta and minimally in coronary arteries. Aorta normal caliber. No pericardial effusion. Mediastinum/Nodes: No thoracic adenopathy. Small hiatal hernia. Questionable wall thickening of the proximal to mid esophagus versus artifact from underdistention. 8 mm RIGHT thyroid nodule. Questionable tiny LEFT thyroid nodule. Lungs/Pleura: Postsurgical changes LEFT lung with additional areas of atelectasis versus  scarring at LEFT base increased since previous exam. Chronic non solid opacity in the RIGHT lower lobe 2.1 x 1.5 cm image 63 previously 2.4 x 1.8 cm. Additional central areas of parenchymal opacity versus scarring in the superior segment of the RIGHT lower lobe and lateral aspect of the RIGHT upper lobe unchanged. Progressive subpleural opacity in the RIGHT upper lobe 15 x 10 mm image 37. Upper Abdomen: Unremarkable Musculoskeletal: Demineralized without acute osseous findings. IMPRESSION: Postsurgical changes LEFT lung with scattered areas of scarring in both lungs including a non solid opacity in the RIGHT lower lobe 2.1 x 1.5 cm. Additional areas of central lung scarring and bibasilar atelectasis versus scarring with a new subpleural nodular focus in the RIGHT upper lobe 15 x 10 mm, question progressive scarring versus developing nodule since previous exam. Either short-term follow-up CT imaging to establish stability or assessment by PET-CT recommended to exclude neoplasm RIGHT upper lobe. Aortic atherosclerosis. Electronically Signed   By: Lavonia Dana M.D.   On: 06/20/2016 16:58   US Renal  Result Date: 06/20/2016 CLINICAL DATA:  Acute renal injury, elevated creatinine. EXAM: RENAL / URINARY TRACT ULTRASOUND COMPLETE COMPARISON:  08/22/2008 CT FINDINGS: Right Kidney: Length: 11.3 cm. No focal renal mass. Small extrarenal pelvis. Cortical-medullary distinction is maintained. Left  Kidney: Length: 11.9 cm. Slight increase in echogenicity of the renal cortex of the cortical-medullary distinction is still maintained. No mass or hydronephrosis visualized. Bladder: Appears normal for degree of bladder distention. Bilateral ureteral jets are noted consistent with patency of the ureters. IMPRESSION: Slight increased left renal echogenicity which may reflect early medical renal disease. No obstructive uropathy or nephrolithiasis. Electronically Signed   By: Ashley Royalty M.D.   On: 06/20/2016 00:06       Subjective: States that overall she feels much better compared to admission. Ambulating well in the room without dyspnea, chest pain, dizziness or lightheadedness. Feels stronger. No headache, sore throat, cough reported. Still having some night sweats and low-grade fevers mostly at night.  Discharge Exam:  Vitals:   06/23/16 0626 06/23/16 0732 06/23/16 0949 06/23/16 1331  BP:  106/61 (!) 111/44 (!) 121/54  Pulse:  79 72 77  Resp:  16  17  Temp: 100.3 F (37.9 C) 100.2 F (37.9 C)  98.7 F (37.1 C)  TempSrc: Oral Oral  Oral  SpO2:  94%  99%  Weight:      Height:        General: Pt lying comfortably in bed & appears in no obvious distress.Does not look septic or toxic. Cardiovascular: S1 & S2 heard, RRR, S1/S2 +. No murmurs, rubs, gallops or clicks. No JVD or pedal edema. Telemetry: Sinus rhythm, occasional PVCs and bigemini. Respiratory: Occasional basal crackles but otherwise clear to auscultation. No increased work of breathing. Abdominal:  Non distended, non tender & soft. No organomegaly or masses appreciated. Normal bowel sounds heard. CNS: Alert and oriented. No focal deficits. Extremities: no edema, no cyanosis    The results of significant diagnostics from this hospitalization (including imaging, microbiology, ancillary and laboratory) are listed below for reference.     Microbiology: Recent Results (from the past 240 hour(s))  Culture, blood (routine x 2) Call MD if unable to obtain prior to antibiotics being given     Status: None (Preliminary result)   Collection Time: 06/20/16 12:59 AM  Result Value Ref Range Status   Specimen Description BLOOD LEFT ANTECUBITAL  Final   Special Requests BOTTLES DRAWN AEROBIC AND ANAEROBIC 5CC EA  Final   Culture NO GROWTH 3 DAYS  Final   Report Status PENDING  Incomplete  Culture, blood (routine x 2) Call MD if unable to obtain prior to antibiotics being given     Status: None (Preliminary result)   Collection Time:  06/20/16 12:59 AM  Result Value Ref Range Status   Specimen Description BLOOD BLOOD LEFT HAND  Final   Special Requests IN PEDIATRIC BOTTLE 1CC  Final   Culture NO GROWTH 3 DAYS  Final   Report Status PENDING  Incomplete     Labs:  Basic Metabolic Panel:  Recent Labs Lab 06/20/16 0059 06/20/16 0256 06/20/16 1205 06/21/16 0847 06/22/16 0623 06/23/16 1544  NA  --  139 138 139 139 136  K  --  3.0* 2.9* 3.2* 3.8 3.5  CL  --  101 101 102 103 101  CO2  --  29 29 27 26 24   GLUCOSE  --  116* 93 85 90 120*  BUN  --  12 11 8 6 7   CREATININE  --  1.75* 1.60* 1.65* 1.63* 1.59*  CALCIUM  --  7.4* 7.9* 8.2* 8.0* 8.2*  MG 1.9  --   --   --   --   --    Liver Function Tests:  Recent Labs Lab 06/20/16 0256  AST 21  ALT 13*  ALKPHOS 60  BILITOT 1.0  PROT 5.1*  ALBUMIN 1.7*   CBC:  Recent Labs Lab 06/19/16 2127 06/20/16 0256 06/20/16 1205 06/21/16 0847 06/22/16 0623  WBC 8.9 9.0 9.2 10.6* 9.0  NEUTROABS 6.5 6.9  --   --   --   HGB 9.6* 7.1* 7.5* 10.7* 10.1*  HCT 29.9* 22.1* 23.6* 33.1* 31.7*  MCV 90.6 89.8 90.1 86.9 88.1  PLT 500* 409* 456* 448* 420*   Thyroid function studies  Recent Labs  06/21/16 1334  TSH 2.127   Urinalysis    Component Value Date/Time   COLORURINE STRAW (A) 06/20/2016 0129   APPEARANCEUR CLEAR 06/20/2016 0129   LABSPEC 1.008 06/20/2016 0129   PHURINE 7.0 06/20/2016 0129   GLUCOSEU NEGATIVE 06/20/2016 0129   GLUCOSEU NEGATIVE 06/03/2014 1038   HGBUR MODERATE (A) 06/20/2016 0129   BILIRUBINUR NEGATIVE 06/20/2016 0129   KETONESUR NEGATIVE 06/20/2016 0129   PROTEINUR NEGATIVE 06/20/2016 0129   UROBILINOGEN 0.2 06/03/2014 1038   NITRITE NEGATIVE 06/20/2016 0129   LEUKOCYTESUR TRACE (A) 06/20/2016 0129      Time coordinating discharge: Over 30 minutes  SIGNED:  Vernell Leep, MD, FACP, FHM. [facility] Pager [phone]  If 7PM-7AM, please contact night-coverage [email or web address] Password [email or web address] 06/23/2016, 5:00 PM

## 2016-06-23 NOTE — Telephone Encounter (Deleted)
   This encounter was placed in error.   Monica Becton, MD 06/23/2016, 3:44 PM [facility] Pager [phone] After 3 pm or if no answer, call [phone]

## 2016-06-23 NOTE — Progress Notes (Signed)
[name] to be D/C'd Home per MD order.  Discussed with the patient and all questions fully answered.  VSS, Skin clean, dry and intact without evidence of skin break down, no evidence of skin tears noted. IV catheter discontinued intact. Site without signs and symptoms of complications. Dressing and pressure applied.  An After Visit Summary was printed and given to the patient. Patient received prescription.  D/c education completed with patient/family including follow up instructions, medication list, d/c activities limitations if indicated, with other d/c instructions as indicated by MD - patient able to verbalize understanding, all questions fully answered.   Patient instructed to return to ED, call 911, or call MD for any changes in condition.   Patient to be escorted via [place], and D/C home via private auto.  [name] C 06/23/2016 5:12 PM

## 2016-06-23 NOTE — Telephone Encounter (Signed)
The encounter was placed in error.  Patient did NOT have a sleep study.    Monica Becton, MD 06/23/2016, 4:47 PM [facility] Pager [phone] After 3 pm or if no answer, call [phone]

## 2016-06-25 LAB — CULTURE, BLOOD (ROUTINE X 2)
CULTURE: NO GROWTH
Culture: NO GROWTH

## 2016-06-29 ENCOUNTER — Encounter: Payer: Self-pay | Admitting: Pulmonary Disease

## 2016-06-29 ENCOUNTER — Ambulatory Visit (INDEPENDENT_AMBULATORY_CARE_PROVIDER_SITE_OTHER): Payer: Medicare HMO | Admitting: Pulmonary Disease

## 2016-06-29 VITALS — BP 118/80 | HR 78 | Ht 63.0 in | Wt 145.0 lb

## 2016-06-29 DIAGNOSIS — R911 Solitary pulmonary nodule: Secondary | ICD-10-CM | POA: Diagnosis not present

## 2016-06-29 NOTE — Assessment & Plan Note (Signed)
I have reviewed all per lung biopsy pathology report from 2010-these are consistent with inflammatory etiology and the possibility of Sjogren's disease with overlying infection was raised. Autoimmune workup and subsequent saliva gland biopsy was negative for Sjogren's. No overt autoimmune disease as cropped up over the last few years for this patient. Her current admission seem to have been related to an acute infection related to sinus disease - however sinus disease does not seem to be a predominant complaint for [place] to consider [place] vasculitis.  She does have new nodules in the right upper lobe and in the right lower lobe. The lesion in the right lower lobe appears to be predominantly scarring which is worsened from her prior scan in 2012. The right upper lobe nodule within need follow-up. It is difficult to differentiate acute infection from scarring or inflammation. As such I would suggest repeat imaging in about 8-10 weeks after acute issues are resolved- given the differential, may be best to proceed with PET scan.  Imaging and rationale for this plan was discussed with the patient in detail and evidenced understanding. Greater than 50% time was spent in counseling and coordination of care with the patient

## 2016-06-29 NOTE — Patient Instructions (Signed)
You have nodules on your CT scan of your chest-difficult to differentiate between scarring and pneumonia Arrange for a PET scan in first week of May and follow-up after

## 2016-06-29 NOTE — Progress Notes (Signed)
Subjective:    Patient ID: [name], female    DOB: 1945/08/17, 71 y.o.   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  She was hospitalized 06/2016 for 3 days for fevers following sinus infection that did not respond to outpatient antibiotics. 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 Serial CT scanning was planned but the patient was lost to follow-up. She also underwent saliva gland biopsy 03/2009 which was negative for Sjogren's disease. Autoimmune serology was also negative  She does have history of esophageal stricture and GERD, maintained on PPI. Her fevers have now resolved, she denies cough, wheezing or dyspnea. 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 Allergic rhinitis   . Anxiety   . COPD (chronic obstructive pulmonary disease) ([place]) 05/23/2012  . Diverticulosis   . DJD (degenerative joint disease)   . Esophageal diverticulum   . Esophageal stricture   . GERD (gastroesophageal reflux disease)    . Hiatal hernia   . Hyperlipidemia 05/23/2012   T. Chol 234, LDL 130   . Hypertension   . IBS (irritable bowel syndrome)   . Osteopenia   . Traumatic arthritis     Past Surgical History:  Procedure Laterality Date  . CERVICAL SPINE SURGERY  2005  . LUNG BIOPSY Left    left side, not cancerous, thoracotomy  . VAGINAL HYSTERECTOMY      Allergies  Allergen Reactions  . Ace Inhibitors Anaphylaxis    REACTION: angioedema  . Other Anaphylaxis    Calcium Channel Blocking Agent Diltiazem Analogues  . Pneumococcal Vaccines Other (See Comments)    Red rash, Knot (sore and itches), A fever for a couple days (per pt report)  . Tetanus Toxoid Other (See Comments)    Cellulitis in arm   Social History   Social History  . Marital status: Single    Spouse name: N/A  . Number of children: 2  . Years of education: 12   Occupational History  . Retired Chartered certified accountant    Social History Main Topics  . Smoking status: Former Smoker    Types: Cigarettes    Quit date: 05/04/1985  . Smokeless tobacco: Never Used  . Alcohol use 0.0 oz/week     Comment: occasional  . Drug use: No  . Sexual activity: Not on file   Other Topics Concern  . Not on file   Social History Narrative   Lives alone   Caffeine -tea, 1 cup daily;  Pepsi maybe one a day     Family History  Problem Relation Age of Onset  . Throat cancer Father   . Hypertension Sister   . Thyroid disease Daughter   . Hypertension Sister     x 2  . Breast cancer Maternal Aunt   . Blindness Sister     legally blind  . Colon cancer Neg Hx   . Stomach cancer Neg Hx     Review of Systems Constitutional: negative for anorexia, fevers and sweats  Eyes: negative for irritation, redness and visual disturbance  Ears, nose, mouth, throat, and face: negative for earaches, epistaxis, nasal congestion and sore throat  Respiratory: negative for cough, dyspnea on exertion, sputum and wheezing  Cardiovascular: negative for chest  pain, dyspnea, lower extremity edema, orthopnea, palpitations and syncope  Gastrointestinal: negative for abdominal pain, constipation, diarrhea, melena, nausea and vomiting  Genitourinary:negative for dysuria, frequency and hematuria  Hematologic/lymphatic: negative for bleeding, easy bruising and lymphadenopathy  Musculoskeletal:negative for arthralgias, muscle weakness and stiff joints  Neurological: negative for coordination problems, gait problems, headaches and weakness  Endocrine: negative for diabetic symptoms including polydipsia, polyuria and weight loss     Objective:   Physical Exam  Gen. Pleasant, well-nourished, in no distress, normal affect ENT - no lesions, no post nasal drip Neck: No JVD, no thyromegaly, no carotid bruits Lungs: no use of accessory muscles, no dullness to percussion, clear without rales or rhonchi  Cardiovascular: Rhythm regular, heart sounds  normal, no murmurs or gallops, no peripheral edema Abdomen: soft and non-tender, no hepatosplenomegaly, BS normal. Musculoskeletal: No deformities, no cyanosis or clubbing Neuro:  alert, non focal       Assessment & Plan:

## 2016-07-05 DIAGNOSIS — A419 Sepsis, unspecified organism: Secondary | ICD-10-CM | POA: Diagnosis not present

## 2016-07-05 DIAGNOSIS — D649 Anemia, unspecified: Secondary | ICD-10-CM | POA: Diagnosis not present

## 2016-07-05 DIAGNOSIS — N289 Disorder of kidney and ureter, unspecified: Secondary | ICD-10-CM | POA: Diagnosis not present

## 2016-07-05 DIAGNOSIS — Z09 Encounter for follow-up examination after completed treatment for conditions other than malignant neoplasm: Secondary | ICD-10-CM | POA: Diagnosis not present

## 2016-07-11 ENCOUNTER — Ambulatory Visit (HOSPITAL_COMMUNITY): Payer: Medicare HMO

## 2016-07-19 DIAGNOSIS — N183 Chronic kidney disease, stage 3 (moderate): Secondary | ICD-10-CM | POA: Diagnosis not present

## 2016-07-19 DIAGNOSIS — N179 Acute kidney failure, unspecified: Principal | ICD-10-CM

## 2016-07-19 DIAGNOSIS — R918 Other nonspecific abnormal finding of lung field: Secondary | ICD-10-CM | POA: Diagnosis not present

## 2016-07-19 DIAGNOSIS — D631 Anemia in chronic kidney disease: Secondary | ICD-10-CM | POA: Diagnosis not present

## 2016-07-19 NOTE — Progress Notes (Signed)
[facility] Direct Admission Accept Note  70 yo F with HTN with subacute renal failure, suspect vasculitis, needs HD and biopsy.  Patient's Cr baseline as recently as last year was 0.9, but started to climb gradually in November 2017 per available notes (roughly 1.1 --> 1.2 --> 1.7 --> 2.1 over last few months). She has had some concern for autoimmune disease in the past, but nothing outright, and no clear clinical picture or lab results. For example, she has had known lung nodules for years, followed by Dr. Elsworth Soho and Dr. Chase Caller here, and also seen at [facility] where there was a suspicion of autoimmune disease but work up reportedly negative, and no clear diagnosis despite biopsy.   More recently, she has had recurrent sinusitis requiring outpatient antibiotics several times in the last 6 months, and then she was admitted to [facility] in January 2018 for fever, lung infiltrate, suspected pneumonia (but also AKI, anemia and weight loss). She was treated with antibiotics again and seemed to improve but it seems from D/C summ that the diagnosis of CAP was not 100% clear. Regardless, her renal function improved with fluids, and she was suspected to have some pre-renal azotemia.  In follow up with her PCP, her renal function was worsening again, so she was referred to Nephrology. Seen this week by Dr. Lorrene Reid.  She complains lately of decreased appetite, dysgeusia, mild swelling, muscle cramps. Still making urine. Otherwise stable, symptomatically.  CMET in Dr. Sanda Klein office returned tonight showing creatinine >12 mg/dL. BUN 74, K 4, Bicarb okay, WBC 10.2, Hgb 10.3 g/dL. UA showed pigmented and granular casts, RBCs again.  Nephrology requests admission for HD, renal biopsy.   Accepted to med surg bed, inpatient status. Dr. Lorrene Reid will try to contact Dr. Posey Pronto, rounding nephrology tomorrow, and will try to send the patient with a copy of her office note from today.

## 2016-07-20 ENCOUNTER — Inpatient Hospital Stay (HOSPITAL_COMMUNITY): Payer: Medicare HMO

## 2016-07-20 ENCOUNTER — Inpatient Hospital Stay (HOSPITAL_COMMUNITY)
Admission: AD | Admit: 2016-07-20 | Discharge: 2016-08-14 | DRG: 683 | Disposition: A | Discharge status: Home Health | Payer: Medicare HMO | Source: Ambulatory Visit | Attending: Internal Medicine | Admitting: Internal Medicine

## 2016-07-20 ENCOUNTER — Encounter (HOSPITAL_COMMUNITY): Payer: Self-pay | Admitting: General Practice

## 2016-07-20 DIAGNOSIS — D696 Thrombocytopenia, unspecified: Secondary | ICD-10-CM | POA: Diagnosis present

## 2016-07-20 DIAGNOSIS — K589 Irritable bowel syndrome without diarrhea: Secondary | ICD-10-CM | POA: Diagnosis present

## 2016-07-20 DIAGNOSIS — R432 Parageusia: Secondary | ICD-10-CM | POA: Diagnosis present

## 2016-07-20 DIAGNOSIS — W06XXXA Fall from bed, initial encounter: Secondary | ICD-10-CM | POA: Diagnosis not present

## 2016-07-20 DIAGNOSIS — N186 End stage renal disease: Secondary | ICD-10-CM | POA: Diagnosis not present

## 2016-07-20 DIAGNOSIS — N179 Acute kidney failure, unspecified: Secondary | ICD-10-CM

## 2016-07-20 DIAGNOSIS — N189 Chronic kidney disease, unspecified: Secondary | ICD-10-CM | POA: Diagnosis not present

## 2016-07-20 DIAGNOSIS — K219 Gastro-esophageal reflux disease without esophagitis: Secondary | ICD-10-CM | POA: Diagnosis present

## 2016-07-20 DIAGNOSIS — R0902 Hypoxemia: Secondary | ICD-10-CM | POA: Diagnosis present

## 2016-07-20 DIAGNOSIS — J0140 Acute pansinusitis, unspecified: Secondary | ICD-10-CM

## 2016-07-20 DIAGNOSIS — D631 Anemia in chronic kidney disease: Secondary | ICD-10-CM | POA: Diagnosis present

## 2016-07-20 DIAGNOSIS — R040 Epistaxis: Secondary | ICD-10-CM | POA: Diagnosis present

## 2016-07-20 DIAGNOSIS — Z87891 Personal history of nicotine dependence: Secondary | ICD-10-CM

## 2016-07-20 DIAGNOSIS — I7782 Antineutrophilic cytoplasmic antibody (ANCA) vasculitis: Secondary | ICD-10-CM

## 2016-07-20 DIAGNOSIS — N059 Unspecified nephritic syndrome with unspecified morphologic changes: Secondary | ICD-10-CM | POA: Diagnosis not present

## 2016-07-20 DIAGNOSIS — M8580 Other specified disorders of bone density and structure, unspecified site: Secondary | ICD-10-CM | POA: Diagnosis present

## 2016-07-20 DIAGNOSIS — E784 Other hyperlipidemia: Secondary | ICD-10-CM

## 2016-07-20 DIAGNOSIS — K5900 Constipation, unspecified: Secondary | ICD-10-CM | POA: Diagnosis not present

## 2016-07-20 DIAGNOSIS — I10 Essential (primary) hypertension: Secondary | ICD-10-CM | POA: Diagnosis not present

## 2016-07-20 DIAGNOSIS — R7982 Elevated C-reactive protein (CRP): Secondary | ICD-10-CM | POA: Diagnosis present

## 2016-07-20 DIAGNOSIS — J329 Chronic sinusitis, unspecified: Secondary | ICD-10-CM

## 2016-07-20 DIAGNOSIS — Z79899 Other long term (current) drug therapy: Secondary | ICD-10-CM

## 2016-07-20 DIAGNOSIS — Z4901 Encounter for fitting and adjustment of extracorporeal dialysis catheter: Secondary | ICD-10-CM | POA: Diagnosis not present

## 2016-07-20 DIAGNOSIS — D638 Anemia in other chronic diseases classified elsewhere: Secondary | ICD-10-CM | POA: Diagnosis not present

## 2016-07-20 DIAGNOSIS — R042 Hemoptysis: Secondary | ICD-10-CM | POA: Diagnosis present

## 2016-07-20 DIAGNOSIS — Z992 Dependence on renal dialysis: Secondary | ICD-10-CM

## 2016-07-20 DIAGNOSIS — J90 Pleural effusion, not elsewhere classified: Secondary | ICD-10-CM | POA: Diagnosis not present

## 2016-07-20 DIAGNOSIS — Z8249 Family history of ischemic heart disease and other diseases of the circulatory system: Secondary | ICD-10-CM

## 2016-07-20 DIAGNOSIS — I776 Arteritis, unspecified: Secondary | ICD-10-CM | POA: Diagnosis present

## 2016-07-20 DIAGNOSIS — D72819 Decreased white blood cell count, unspecified: Secondary | ICD-10-CM | POA: Diagnosis not present

## 2016-07-20 DIAGNOSIS — Z7982 Long term (current) use of aspirin: Secondary | ICD-10-CM

## 2016-07-20 DIAGNOSIS — M310 Hypersensitivity angiitis: Secondary | ICD-10-CM | POA: Diagnosis present

## 2016-07-20 DIAGNOSIS — I120 Hypertensive chronic kidney disease with stage 5 chronic kidney disease or end stage renal disease: Secondary | ICD-10-CM | POA: Diagnosis present

## 2016-07-20 DIAGNOSIS — I129 Hypertensive chronic kidney disease with stage 1 through stage 4 chronic kidney disease, or unspecified chronic kidney disease: Secondary | ICD-10-CM | POA: Diagnosis present

## 2016-07-20 DIAGNOSIS — E785 Hyperlipidemia, unspecified: Secondary | ICD-10-CM | POA: Diagnosis present

## 2016-07-20 DIAGNOSIS — D649 Anemia, unspecified: Secondary | ICD-10-CM | POA: Diagnosis not present

## 2016-07-20 DIAGNOSIS — N019 Rapidly progressive nephritic syndrome with unspecified morphologic changes: Secondary | ICD-10-CM | POA: Diagnosis present

## 2016-07-20 DIAGNOSIS — Z887 Allergy status to serum and vaccine status: Secondary | ICD-10-CM

## 2016-07-20 DIAGNOSIS — N170 Acute kidney failure with tubular necrosis: Secondary | ICD-10-CM | POA: Diagnosis not present

## 2016-07-20 DIAGNOSIS — N185 Chronic kidney disease, stage 5: Secondary | ICD-10-CM | POA: Diagnosis not present

## 2016-07-20 DIAGNOSIS — F419 Anxiety disorder, unspecified: Secondary | ICD-10-CM | POA: Diagnosis present

## 2016-07-20 DIAGNOSIS — Z90710 Acquired absence of both cervix and uterus: Secondary | ICD-10-CM | POA: Diagnosis not present

## 2016-07-20 DIAGNOSIS — R918 Other nonspecific abnormal finding of lung field: Secondary | ICD-10-CM

## 2016-07-20 DIAGNOSIS — Y92230 Patient room in hospital as the place of occurrence of the external cause: Secondary | ICD-10-CM | POA: Diagnosis not present

## 2016-07-20 DIAGNOSIS — Z888 Allergy status to other drugs, medicaments and biological substances status: Secondary | ICD-10-CM

## 2016-07-20 DIAGNOSIS — R5383 Other fatigue: Secondary | ICD-10-CM | POA: Diagnosis present

## 2016-07-20 HISTORY — DX: Headache: R51

## 2016-07-20 HISTORY — DX: Pneumonia, unspecified organism: J18.9

## 2016-07-20 HISTORY — DX: Personal history of other medical treatment: Z92.89

## 2016-07-20 HISTORY — DX: Chronic sinusitis, unspecified: J32.9

## 2016-07-20 HISTORY — DX: Acute kidney failure, unspecified: N17.9

## 2016-07-20 HISTORY — DX: Cervical disc disorder, unspecified, unspecified cervical region: M50.90

## 2016-07-20 HISTORY — DX: Headache, unspecified: R51.9

## 2016-07-20 HISTORY — DX: Chest pain, unspecified: R07.9

## 2016-07-20 HISTORY — PX: IR GENERIC HISTORICAL: IMG1180011

## 2016-07-20 HISTORY — DX: Other nonspecific abnormal finding of lung field: R91.8

## 2016-07-20 LAB — COMPREHENSIVE METABOLIC PANEL
ALK PHOS: 88 U/L (ref 38–126)
ALT: 8 U/L — AB (ref 14–54)
AST: 15 U/L (ref 15–41)
Albumin: 2 g/dL — ABNORMAL LOW (ref 3.5–5.0)
Anion gap: 19 — ABNORMAL HIGH (ref 5–15)
BILIRUBIN TOTAL: 1.1 mg/dL (ref 0.3–1.2)
BUN: 84 mg/dL — AB (ref 6–20)
CALCIUM: 8.2 mg/dL — AB (ref 8.9–10.3)
CO2: 23 mmol/L (ref 22–32)
CREATININE: 13.91 mg/dL — AB (ref 0.44–1.00)
Chloride: 93 mmol/L — ABNORMAL LOW (ref 101–111)
GFR calc non Af Amer: 2 mL/min — ABNORMAL LOW (ref >60)
GFR, EST AFRICAN AMERICAN: 3 mL/min — AB (ref >60)
Glucose, Bld: 83 mg/dL (ref 65–99)
Potassium: 3.8 mmol/L (ref 3.5–5.1)
Sodium: 135 mmol/L (ref 135–145)
TOTAL PROTEIN: 6.5 g/dL (ref 6.5–8.1)

## 2016-07-20 LAB — CBC WITH DIFFERENTIAL/PLATELET
BASOS ABS: 0.1 10*3/uL (ref 0.0–0.1)
BASOS PCT: 1 %
EOS PCT: 5 %
Eosinophils Absolute: 0.4 10*3/uL (ref 0.0–0.7)
HEMATOCRIT: 28.1 % — AB (ref 36.0–46.0)
Hemoglobin: 9.3 g/dL — ABNORMAL LOW (ref 12.0–15.0)
Lymphocytes Relative: 10 %
Lymphs Abs: 0.9 10*3/uL (ref 0.7–4.0)
MCH: 27.8 pg (ref 26.0–34.0)
MCHC: 33.1 g/dL (ref 30.0–36.0)
MCV: 84.1 fL (ref 78.0–100.0)
MONO ABS: 0.8 10*3/uL (ref 0.1–1.0)
Monocytes Relative: 10 %
NEUTROS ABS: 6.4 10*3/uL (ref 1.7–7.7)
Neutrophils Relative %: 74 %
PLATELETS: 283 10*3/uL (ref 150–400)
RBC: 3.34 MIL/uL — AB (ref 3.87–5.11)
RDW: 16 % — ABNORMAL HIGH (ref 11.5–15.5)
WBC: 8.6 10*3/uL (ref 4.0–10.5)

## 2016-07-20 LAB — IRON AND TIBC
Iron: 25 ug/dL — ABNORMAL LOW (ref 28–170)
Saturation Ratios: 19 % (ref 10.4–31.8)
TIBC: 133 ug/dL — ABNORMAL LOW (ref 250–450)
UIBC: 108 ug/dL

## 2016-07-20 LAB — SEDIMENTATION RATE: Sed Rate: 124 mm/hr — ABNORMAL HIGH (ref 0–22)

## 2016-07-20 LAB — PROTIME-INR
INR: 1.36
PROTHROMBIN TIME: 16.9 s — AB (ref 11.4–15.2)

## 2016-07-20 LAB — FERRITIN: Ferritin: 1163 ng/mL — ABNORMAL HIGH (ref 11–307)

## 2016-07-20 LAB — GLUCOSE, CAPILLARY: GLUCOSE-CAPILLARY: 91 mg/dL (ref 65–99)

## 2016-07-20 LAB — APTT: APTT: 38 s — AB (ref 24–36)

## 2016-07-20 LAB — C-REACTIVE PROTEIN: CRP: 31.3 mg/dL — ABNORMAL HIGH (ref <1.0)

## 2016-07-20 MED ORDER — MUPIROCIN 2 % EX OINT
TOPICAL_OINTMENT | Freq: Two times a day (BID) | CUTANEOUS | Status: AC
  Administered 2016-07-20 – 2016-07-22 (×4): via NASAL
  Filled 2016-07-20 (×4): qty 22

## 2016-07-20 MED ORDER — ONDANSETRON HCL 4 MG/2ML IJ SOLN
4.0000 mg | Freq: Four times a day (QID) | INTRAMUSCULAR | Status: DC | PRN
  Administered 2016-07-27 – 2016-08-04 (×10): 4 mg via INTRAVENOUS
  Filled 2016-07-20 (×10): qty 2

## 2016-07-20 MED ORDER — SODIUM CHLORIDE 0.9 % IV SOLN: 100.0000 mL | INTRAVENOUS | Status: DC | PRN

## 2016-07-20 MED ORDER — SODIUM CHLORIDE 0.9 % IV SOLN
1000.0000 mg | Freq: Every day | INTRAVENOUS | Status: AC
  Administered 2016-07-20 – 2016-07-22 (×3): 1000 mg via INTRAVENOUS
  Filled 2016-07-20 (×4): qty 8

## 2016-07-20 MED ORDER — LIDOCAINE HCL (PF) 1 % IJ SOLN
INTRAMUSCULAR | Status: AC | PRN
  Administered 2016-07-20: 5 mL

## 2016-07-20 MED ORDER — SODIUM CHLORIDE 0.9 % IV SOLN: 250.0000 mL | INTRAVENOUS | Status: DC | PRN

## 2016-07-20 MED ORDER — LIDOCAINE-PRILOCAINE 2.5-2.5 % EX CREA: 1.0000 "application " | TOPICAL_CREAM | CUTANEOUS | Status: DC | PRN

## 2016-07-20 MED ORDER — LIDOCAINE HCL (PF) 1 % IJ SOLN: 5.0000 mL | INTRAMUSCULAR | Status: DC | PRN

## 2016-07-20 MED ORDER — AMLODIPINE BESYLATE 5 MG PO TABS
5.0000 mg | ORAL_TABLET | Freq: Every day | ORAL | Status: DC
  Administered 2016-07-20 – 2016-08-08 (×19): 5 mg via ORAL
  Filled 2016-07-20 (×19): qty 1

## 2016-07-20 MED ORDER — HEPARIN SODIUM (PORCINE) 1000 UNIT/ML IJ SOLN
INTRAMUSCULAR | Status: AC
  Filled 2016-07-20: qty 1

## 2016-07-20 MED ORDER — CEFAZOLIN SODIUM-DEXTROSE 2-4 GM/100ML-% IV SOLN
2.0000 g | INTRAVENOUS | Status: AC
  Administered 2016-07-20: 2 g via INTRAVENOUS

## 2016-07-20 MED ORDER — SODIUM CHLORIDE 0.9% FLUSH
3.0000 mL | Freq: Two times a day (BID) | INTRAVENOUS | Status: DC
  Administered 2016-07-20 – 2016-08-14 (×46): 3 mL via INTRAVENOUS

## 2016-07-20 MED ORDER — LORATADINE 10 MG PO TABS
10.0000 mg | ORAL_TABLET | Freq: Every day | ORAL | Status: DC
  Administered 2016-07-20 – 2016-08-14 (×26): 10 mg via ORAL
  Filled 2016-07-20 (×26): qty 1

## 2016-07-20 MED ORDER — ATORVASTATIN CALCIUM 10 MG PO TABS
10.0000 mg | ORAL_TABLET | Freq: Every day | ORAL | Status: DC
  Administered 2016-07-20 – 2016-08-14 (×23): 10 mg via ORAL
  Filled 2016-07-20 (×25): qty 1

## 2016-07-20 MED ORDER — FENTANYL CITRATE (PF) 100 MCG/2ML IJ SOLN
INTRAMUSCULAR | Status: AC | PRN
  Administered 2016-07-20 (×2): 25 ug via INTRAVENOUS

## 2016-07-20 MED ORDER — PREDNISONE 50 MG PO TABS
60.0000 mg | ORAL_TABLET | Freq: Every day | ORAL | Status: DC
  Administered 2016-07-23 – 2016-08-05 (×14): 60 mg via ORAL
  Filled 2016-07-20 (×14): qty 1

## 2016-07-20 MED ORDER — ACETAMINOPHEN 325 MG PO TABS: 650.0000 mg | ORAL_TABLET | Freq: Four times a day (QID) | ORAL | Status: DC | PRN

## 2016-07-20 MED ORDER — LIDOCAINE HCL (PF) 1 % IJ SOLN
INTRAMUSCULAR | Status: AC
  Filled 2016-07-20: qty 30

## 2016-07-20 MED ORDER — SALINE SPRAY 0.65 % NA SOLN
2.0000 | Freq: Three times a day (TID) | NASAL | Status: DC
  Administered 2016-07-21 – 2016-08-12 (×18): 2 via NASAL
  Filled 2016-07-20: qty 44

## 2016-07-20 MED ORDER — ALTEPLASE 2 MG IJ SOLR: 2.0000 mg | Freq: Once | INTRAMUSCULAR | Status: DC | PRN

## 2016-07-20 MED ORDER — SODIUM CHLORIDE 0.9% FLUSH
3.0000 mL | INTRAVENOUS | Status: DC | PRN
  Administered 2016-07-30 – 2016-08-12 (×2): 3 mL via INTRAVENOUS
  Filled 2016-07-20 (×2): qty 3

## 2016-07-20 MED ORDER — MIDAZOLAM HCL 2 MG/2ML IJ SOLN
INTRAMUSCULAR | Status: AC | PRN
  Administered 2016-07-20: 1 mg via INTRAVENOUS

## 2016-07-20 MED ORDER — PANTOPRAZOLE SODIUM 40 MG PO TBEC
40.0000 mg | DELAYED_RELEASE_TABLET | Freq: Every day | ORAL | Status: DC
  Administered 2016-07-20 – 2016-08-14 (×26): 40 mg via ORAL
  Filled 2016-07-20 (×26): qty 1

## 2016-07-20 MED ORDER — ONDANSETRON HCL 4 MG PO TABS
4.0000 mg | ORAL_TABLET | Freq: Four times a day (QID) | ORAL | Status: DC | PRN
  Administered 2016-07-22 – 2016-07-30 (×4): 4 mg via ORAL
  Filled 2016-07-20 (×4): qty 1

## 2016-07-20 MED ORDER — FLUTICASONE PROPIONATE 50 MCG/ACT NA SUSP
2.0000 | Freq: Every day | NASAL | Status: DC
  Administered 2016-07-25 – 2016-07-30 (×4): 2 via NASAL
  Filled 2016-07-20: qty 16

## 2016-07-20 MED ORDER — CEFAZOLIN SODIUM-DEXTROSE 2-4 GM/100ML-% IV SOLN
INTRAVENOUS | Status: AC
  Administered 2016-07-20: 2 g via INTRAVENOUS
  Filled 2016-07-20: qty 100

## 2016-07-20 MED ORDER — FENTANYL CITRATE (PF) 100 MCG/2ML IJ SOLN
INTRAMUSCULAR | Status: AC
  Filled 2016-07-20: qty 2

## 2016-07-20 MED ORDER — PENTAFLUOROPROP-TETRAFLUOROETH EX AERO: 1.0000 "application " | INHALATION_SPRAY | CUTANEOUS | Status: DC | PRN

## 2016-07-20 MED ORDER — ACETAMINOPHEN 650 MG RE SUPP: 650.0000 mg | Freq: Four times a day (QID) | RECTAL | Status: DC | PRN

## 2016-07-20 MED ORDER — ASPIRIN EC 81 MG PO TBEC
81.0000 mg | DELAYED_RELEASE_TABLET | Freq: Every day | ORAL | Status: DC
  Administered 2016-07-22 – 2016-08-14 (×24): 81 mg via ORAL
  Filled 2016-07-20 (×26): qty 1

## 2016-07-20 MED ORDER — MIDAZOLAM HCL 2 MG/2ML IJ SOLN
INTRAMUSCULAR | Status: AC
  Filled 2016-07-20: qty 2

## 2016-07-20 MED ORDER — HEPARIN SODIUM (PORCINE) 1000 UNIT/ML DIALYSIS: 1000.0000 [IU] | INTRAMUSCULAR | Status: DC | PRN

## 2016-07-20 NOTE — Progress Notes (Signed)
Patient arrived to unit by bed.  Reviewed treatment plan and this RN agrees with plan.  Report received from bedside RN, Joaquim Lai.  Consent obtained.  Patient A * O X 4.   Lung sounds clear to ausculation in all fields. No edema. Cardiac:  NSR.  Removed caps and cleansed RIJ catheter with chlorhedxidine.  Aspirated ports of heparin and flushed them with saline per protocol.  Connected and secured lines, initiated treatment at 2223.  UF Goal of 1000 mL and net fluid removal 0.5 L.  Will continue to monitor.

## 2016-07-20 NOTE — H&P (Signed)
History and Physical    [name] TMH:[id number] DOB: November 20, 1945 DOA: 07/20/2016  PCP: Merrilee Seashore, MD Patient coming from: Home  Chief Complaint: Renal failure  HPI: [name] is a 71 y.o. female with medical history significant of COPD, esophageal stricture, GERD, hiatal hernia, hyperlipidemia, hypertension, IBS presenting from nephrology, Dr. Richardean Canal office, after pt see in [place] and found to have acute renal failure necessitating aggressive workup and treatment. Patient with few actual complaints. Patient has had intermittent bouts of sinusitis for the last several months for which she has had occasional treatment with with only limited improvement. Only complaining of some generalized fatigue, sinus discomfort, intermittent crampy leg pain. Patient denies fevers, nausea, vomiting, diarrhea, constipation, abdominal pain, flank pain, dysuria, frequency, neck stiffness, headache, focal neurological deficits. Patient does endorse decreasing urine output over the last several days.    ED Course: NA  Review of Systems: As per HPI otherwise 10 point review of systems negative.   Ambulatory Status: No restrictions  Past Medical History:  Diagnosis Date  . Allergic rhinitis   . Anxiety   . COPD (chronic obstructive pulmonary disease) ([place]) 05/23/2012  . Diverticulosis   . DJD (degenerative joint disease)   . Esophageal diverticulum   . Esophageal stricture   . GERD (gastroesophageal reflux disease)   . Hiatal hernia   . Hyperlipidemia 05/23/2012   T. Chol 234, LDL 130   . Hypertension   . IBS (irritable bowel syndrome)   . Osteopenia   . Traumatic arthritis     Past Surgical History:  Procedure Laterality Date  . CERVICAL SPINE SURGERY  2005  . LUNG BIOPSY Left    left side, not cancerous, thoracotomy  . VAGINAL HYSTERECTOMY      Social History   Social History  . Marital status: Single    Spouse name: N/A  . Number of children: 2  . Years of  education: 12   Occupational History  . Retired Chartered certified accountant    Social History Main Topics  . Smoking status: Former Smoker    Types: Cigarettes    Quit date: 05/04/1985  . Smokeless tobacco: Never Used  . Alcohol use 0.0 oz/week     Comment: occasional  . Drug use: No  . Sexual activity: Not on file   Other Topics Concern  . Not on file   Social History Narrative   Lives alone   Caffeine -tea, 1 cup daily;  Pepsi maybe one a day    Allergies  Allergen Reactions  . Ace Inhibitors Anaphylaxis    REACTION: angioedema  . Other Anaphylaxis    Calcium Channel Blocking Agent Diltiazem Analogues  . Pneumococcal Vaccines Other (See Comments)    Red rash, Knot (sore and itches), A fever for a couple days (per pt report)  . Tetanus Toxoid Other (See Comments)    Cellulitis in arm    Family History  Problem Relation Age of Onset  . Throat cancer Father   . Hypertension Sister   . Thyroid disease Daughter   . Hypertension Sister     x 2  . Breast cancer Maternal Aunt   . Blindness Sister     legally blind  . Colon cancer Neg Hx   . Stomach cancer Neg Hx     Prior to Admission medications   Medication Sig Start Date End Date Taking? Authorizing Provider  amLODipine (NORVASC) 5 MG tablet Take 5 mg by mouth daily.   Yes Historical Provider, MD  aspirin  EC 81 MG tablet Take 81 mg by mouth daily.   Yes Historical Provider, MD  atorvastatin (LIPITOR) 10 MG tablet Take 1 tablet (10 mg total) by mouth daily. 10/20/15  Yes Biagio Borg, MD  hydrochlorothiazide (HYDRODIURIL) 12.5 MG tablet Take 12.5 mg by mouth daily. 05/28/16  Yes Historical Provider, MD  pantoprazole (PROTONIX) 40 MG tablet Take 1 tablet (40 mg total) by mouth daily. 10/20/15  Yes Biagio Borg, MD  sodium chloride (OCEAN) 0.65 % SOLN nasal spray Place 2 sprays into both nostrils 3 (three) times daily.   Yes Historical Provider, MD  acetaminophen (TYLENOL) 325 MG tablet Take 2 tablets (650 mg total) by mouth every 6  (six) hours as needed for mild pain, moderate pain, fever or headache. Patient not taking: Reported on 07/20/2016 06/23/16   Modena Jansky, MD  amoxicillin-clavulanate (AUGMENTIN) 500-125 MG tablet Take 1 tablet (500 mg total) by mouth 2 (two) times daily. Patient not taking: Reported on 07/20/2016 06/23/16   Modena Jansky, MD  feeding supplement (BOOST / RESOURCE BREEZE) LIQD Take 1 Container by mouth 3 (three) times daily between meals. Patient not taking: Reported on 07/20/2016 06/23/16   Modena Jansky, MD    Physical Exam: Vitals:   07/20/16 0905  BP: (!) 150/60  Pulse: 72  Resp: 18  Temp: 98 F (36.7 C)  TempSrc: Oral  SpO2: 100%     General:  Appears calm and comfortable Eyes:  PERRL, EOMI, normal lids, iris ENT:  grossly normal hearing, lips & tongue, mmm Neck:  no LAD, masses or thyromegaly Cardiovascular:  RRR, no m/r/g. No LE edema.  Respiratory:  CTA bilaterally, no w/r/r. Normal respiratory effort. Abdomen:  soft, ntnd, NABS Skin:  no rash or induration seen on limited exam Musculoskeletal:  grossly normal tone BUE/BLE, good ROM, no bony abnormality Psychiatric:  grossly normal mood and affect, speech fluent and appropriate, AOx3 Neurologic:  CN 2-12 grossly intact, moves all extremities in coordinated fashion, sensation intact  Labs on Admission: I have personally reviewed following labs and imaging studies  CBC: No results for input(s): WBC, NEUTROABS, HGB, HCT, MCV, PLT in the last 168 hours. Basic Metabolic Panel: No results for input(s): NA, K, CL, CO2, GLUCOSE, BUN, CREATININE, CALCIUM, MG, PHOS in the last 168 hours. GFR: CrCl cannot be calculated (Patient's most recent lab result is older than the maximum 21 days allowed.). Liver Function Tests: No results for input(s): AST, ALT, ALKPHOS, BILITOT, PROT, ALBUMIN in the last 168 hours. No results for input(s): LIPASE, AMYLASE in the last 168 hours. No results for input(s): AMMONIA in the last 168  hours. Coagulation Profile: No results for input(s): INR, PROTIME in the last 168 hours. Cardiac Enzymes: No results for input(s): CKTOTAL, CKMB, CKMBINDEX, TROPONINI in the last 168 hours. BNP (last 3 results) No results for input(s): PROBNP in the last 8760 hours. HbA1C: No results for input(s): HGBA1C in the last 72 hours. CBG:  Recent Labs Lab 07/20/16 0916  GLUCAP 91   Lipid Profile: No results for input(s): CHOL, HDL, LDLCALC, TRIG, CHOLHDL, LDLDIRECT in the last 72 hours. Thyroid Function Tests: No results for input(s): TSH, T4TOTAL, FREET4, T3FREE, THYROIDAB in the last 72 hours. Anemia Panel: No results for input(s): VITAMINB12, FOLATE, FERRITIN, TIBC, IRON, RETICCTPCT in the last 72 hours. Urine analysis:    Component Value Date/Time   COLORURINE STRAW (A) 06/20/2016 0129   APPEARANCEUR CLEAR 06/20/2016 0129   LABSPEC 1.008 06/20/2016 0129   PHURINE 7.0  06/20/2016 0129   GLUCOSEU NEGATIVE 06/20/2016 0129   GLUCOSEU NEGATIVE 06/03/2014 1038   HGBUR MODERATE (A) 06/20/2016 0129   BILIRUBINUR NEGATIVE 06/20/2016 0129   KETONESUR NEGATIVE 06/20/2016 0129   PROTEINUR NEGATIVE 06/20/2016 0129   UROBILINOGEN 0.2 06/03/2014 1038   NITRITE NEGATIVE 06/20/2016 0129   LEUKOCYTESUR TRACE (A) 06/20/2016 0129    Creatinine Clearance: CrCl cannot be calculated (Patient's most recent lab result is older than the maximum 21 days allowed.).  Sepsis Labs: @LABRCNTIP (procalcitonin:4,lacticidven:4) )No results found for this or any previous visit (from the past 240 hour(s)).   Radiological Exams on Admission: No results found.  EKG: Independently reviewed. Pending  Assessment/Plan Active Problems:   Hyperlipidemia   Essential hypertension   GERD   AKI (acute kidney injury) ([place])   Renal failure   Acute/Subacute renal failure: Sent from nephrology office due to worsening renal function over the last several weeks with most recent creatinine of 12 per report. Aggressive  outpatient workup has been underway to find an etiology.  At this time patient will need at a minimum temporary dialysis with renal biopsy, while outpt labs return. Of note last known normal creatinine on 09/10/2015 of 0.93 with a BUN of 13. Diminished renal function first noted on 06/19/2016 with a creatinine of 2.1 and a BUN of 18. - Further workup decisions per nephrology with likely renal biopsy in the coming days - IR consult for tunneled hemodialysis catheter - Likely vascular consult in the coming days for vein mapping and permanent AV graft if decision made for permanent dialysis - CMP, Mag, PHos  Chronic sinusitis: May be related to etiology of renal failure versus separate issue. Patient with intermittent bouts of sinusitis over the last several months with limited improvement with antibiotics. H/o allergic rhinitis noted.  - CT maxillofacial without contrast - Claritin, Flonase, nasal saline, intranasal mupirocin  HTN: - continue norvasc - hold HCTZ - Hydralazine PRN  HLD: - continue lipitor  GERD: - continue PPI  ACS prophylaxis: h/o abnormal stress. Asymptomatic - EKG pending - continue ASA      DVT prophylaxis: SCD  Code Status: FULL  Family Communication: None  Disposition Plan: pending workup and dialysis Consults called: *Nephrology, IR  Admission status: inpt    Drevion Offord J MD [facility]  If 7PM-7AM, please contact night-coverage [email or web address] Password [facility]  07/20/2016, 12:39 PM

## 2016-07-20 NOTE — Consult Note (Signed)
Chief Complaint: Patient was seen in consultation today for tunneled hemodialysis catheter placement at the request of Dr Jackqulyn Livings  Referring Physician(s): Dr Jackqulyn Livings  Supervising Physician: [name]  Patient Status: [facility] - In-pt  History of Present Illness: [name] is a 71 y.o. female   Pt with Hx CKD  Subacute renal failure; suspected vasculitis Cr has been followed with Dr Lorrene Reid Gradual incline since Nov 2017 Followed for years by Dr Elsworth Soho and Dr Chase Caller for pulm nodules Suspicious for autoimmune disease. Was treated in Jan 2018 for PNA  Recently she has complained of loss of appetite; generalized weakness; leg edema Still making urine Dr Lorrene Reid office visit yesterday revealed Cr 12/ Bun 74 Admitted for urgent dialysis need Plan for possible biopsy soon  Pt has been npo; labs being drawn stat  Past Medical History:  Diagnosis Date  . Allergic rhinitis   . Anxiety   . COPD (chronic obstructive pulmonary disease) ([place]) 05/23/2012  . Diverticulosis   . DJD (degenerative joint disease)   . Esophageal diverticulum   . Esophageal stricture   . GERD (gastroesophageal reflux disease)   . Hiatal hernia   . Hyperlipidemia 05/23/2012   T. Chol 234, LDL 130   . Hypertension   . IBS (irritable bowel syndrome)   . Osteopenia   . Traumatic arthritis     Past Surgical History:  Procedure Laterality Date  . CERVICAL SPINE SURGERY  2005  . LUNG BIOPSY Left    left side, not cancerous, thoracotomy  . VAGINAL HYSTERECTOMY      Allergies: Ace inhibitors; Other; Pneumococcal vaccines; and Tetanus toxoid  Medications: Prior to Admission medications   Medication Sig Start Date End Date Taking? Authorizing Provider  amLODipine (NORVASC) 5 MG tablet Take 5 mg by mouth daily.   Yes Historical Provider, MD  aspirin EC 81 MG tablet Take 81 mg by mouth daily.   Yes Historical Provider, MD  atorvastatin (LIPITOR) 10 MG tablet Take 1 tablet (10 mg total) by  mouth daily. 10/20/15  Yes Biagio Borg, MD  hydrochlorothiazide (HYDRODIURIL) 12.5 MG tablet Take 12.5 mg by mouth daily. 05/28/16  Yes Historical Provider, MD  pantoprazole (PROTONIX) 40 MG tablet Take 1 tablet (40 mg total) by mouth daily. 10/20/15  Yes Biagio Borg, MD  sodium chloride (OCEAN) 0.65 % SOLN nasal spray Place 2 sprays into both nostrils 3 (three) times daily.   Yes Historical Provider, MD  acetaminophen (TYLENOL) 325 MG tablet Take 2 tablets (650 mg total) by mouth every 6 (six) hours as needed for mild pain, moderate pain, fever or headache. Patient not taking: Reported on 07/20/2016 06/23/16   Modena Jansky, MD  amoxicillin-clavulanate (AUGMENTIN) 500-125 MG tablet Take 1 tablet (500 mg total) by mouth 2 (two) times daily. Patient not taking: Reported on 07/20/2016 06/23/16   Modena Jansky, MD  feeding supplement (BOOST / RESOURCE BREEZE) LIQD Take 1 Container by mouth 3 (three) times daily between meals. Patient not taking: Reported on 07/20/2016 06/23/16   Modena Jansky, MD     Family History  Problem Relation Age of Onset  . Throat cancer Father   . Hypertension Sister   . Thyroid disease Daughter   . Hypertension Sister     x 2  . Breast cancer Maternal Aunt   . Blindness Sister     legally blind  . Colon cancer Neg Hx   . Stomach cancer Neg Hx  Social History   Social History  . Marital status: Single    Spouse name: N/A  . Number of children: 2  . Years of education: 12   Occupational History  . Retired Chartered certified accountant    Social History Main Topics  . Smoking status: Former Smoker    Types: Cigarettes    Quit date: 05/04/1985  . Smokeless tobacco: Never Used  . Alcohol use 0.0 oz/week     Comment: occasional  . Drug use: No  . Sexual activity: Not on file   Other Topics Concern  . Not on file   Social History Narrative   Lives alone   Caffeine -tea, 1 cup daily;  Pepsi maybe one a day    Review of Systems: A 12 point ROS discussed and  pertinent positives are indicated in the HPI above.  All other systems are negative.  Review of Systems  Constitutional: Positive for activity change, appetite change and fatigue. Negative for fever and unexpected weight change.  Respiratory: Negative for shortness of breath.   Cardiovascular: Negative for chest pain.  Gastrointestinal: Positive for nausea. Negative for abdominal pain.  Neurological: Positive for weakness.  Psychiatric/Behavioral: Negative for behavioral problems and confusion.    Vital Signs: BP (!) 150/60 (BP Location: Right Arm)   Pulse 72   Temp 98 F (36.7 C) (Oral)   Resp 18   SpO2 100%   Physical Exam  Constitutional: She is oriented to person, place, and time.  Cardiovascular: Normal rate and regular rhythm.   Pulmonary/Chest: Effort normal and breath sounds normal.  Abdominal: Soft. Bowel sounds are normal.  Musculoskeletal: Normal range of motion.  Neurological: She is alert and oriented to person, place, and time.  Skin: Skin is warm and dry.  Psychiatric: She has a normal mood and affect. Her behavior is normal. Judgment and thought content normal.  Nursing note and vitals reviewed.   Mallampati Score:  MD Evaluation Airway: WNL Heart: WNL Abdomen: WNL Chest/ Lungs: WNL ASA  Classification: 3 Mallampati/Airway Score: One  Imaging: Ct Chest Wo Contrast  Result Date: 06/20/2016 CLINICAL DATA:  Fever, fatigue, COPD EXAM: CT CHEST WITHOUT CONTRAST TECHNIQUE: Multidetector CT imaging of the chest was performed following the standard protocol without IV contrast. Sagittal and coronal MPR images reconstructed from axial data set. COMPARISON:  12/06/2010; correlation chest radiograph 06/19/2016 FINDINGS: Cardiovascular: Atherosclerotic calcifications aorta and minimally in coronary arteries. Aorta normal caliber. No pericardial effusion. Mediastinum/Nodes: No thoracic adenopathy. Small hiatal hernia. Questionable wall thickening of the proximal to mid  esophagus versus artifact from underdistention. 8 mm RIGHT thyroid nodule. Questionable tiny LEFT thyroid nodule. Lungs/Pleura: Postsurgical changes LEFT lung with additional areas of atelectasis versus scarring at LEFT base increased since previous exam. Chronic non solid opacity in the RIGHT lower lobe 2.1 x 1.5 cm image 63 previously 2.4 x 1.8 cm. Additional central areas of parenchymal opacity versus scarring in the superior segment of the RIGHT lower lobe and lateral aspect of the RIGHT upper lobe unchanged. Progressive subpleural opacity in the RIGHT upper lobe 15 x 10 mm image 37. Upper Abdomen: Unremarkable Musculoskeletal: Demineralized without acute osseous findings. IMPRESSION: Postsurgical changes LEFT lung with scattered areas of scarring in both lungs including a non solid opacity in the RIGHT lower lobe 2.1 x 1.5 cm. Additional areas of central lung scarring and bibasilar atelectasis versus scarring with a new subpleural nodular focus in the RIGHT upper lobe 15 x 10 mm, question progressive scarring versus developing nodule since previous exam.  Either short-term follow-up CT imaging to establish stability or assessment by PET-CT recommended to exclude neoplasm RIGHT upper lobe. Aortic atherosclerosis. Electronically Signed   By: Lavonia Dana M.D.   On: 06/20/2016 16:58    Labs:  CBC:  Recent Labs  06/20/16 0256 06/20/16 1205 06/21/16 0847 06/22/16 0623  WBC 9.0 9.2 10.6* 9.0  HGB 7.1* 7.5* 10.7* 10.1*  HCT 22.1* 23.6* 33.1* 31.7*  PLT 409* 456* 448* 420*    COAGS: No results for input(s): INR, APTT in the last 8760 hours.  BMP:  Recent Labs  06/20/16 1205 06/21/16 0847 06/22/16 0623 06/23/16 1544  NA 138 139 139 136  K 2.9* 3.2* 3.8 3.5  CL 101 102 103 101  CO2 29 27 26 24   GLUCOSE 93 85 90 120*  BUN 11 8 6 7   CALCIUM 7.9* 8.2* 8.0* 8.2*  CREATININE 1.60* 1.65* 1.63* 1.59*  GFRNONAA 31* 30* 30* 31*  GFRAA 36* 35* 35* 36*    LIVER FUNCTION TESTS:  Recent  Labs  09/10/15 1143 06/20/16 0256  BILITOT 0.5 1.0  AST 21 21  ALT 17 13*  ALKPHOS 131* 60  PROT 7.4 5.1*  ALBUMIN 4.1 1.7*    TUMOR MARKERS: No results for input(s): AFPTM, CEA, CA199, CHROMGRNA in the last 8760 hours.  Assessment and Plan:  Worsening Creatinine: 12 yesterday per Dr Lorrene Reid office Suspicious for vasculitis; auto immune disease Scheduled for tunneled HD catheter asap Risks and Benefits discussed with the patient including, but not limited to bleeding, infection, vascular injury, pneumothorax which may require chest tube placement, air embolism or even death All of the patient's questions were answered, patient is agreeable to proceed. Consent signed and in chart.  Thank you for this interesting consult.  I greatly enjoyed meeting [name] and look forward to participating in their care.  A copy of this report was sent to the requesting provider on this date.  Electronically Signed: Monia Sabal A 07/20/2016, 11:56 AM   I spent a total of 20 Minutes    in face to face in clinical consultation, greater than 50% of which was counseling/coordinating care for tunneled HD catheter placement

## 2016-07-20 NOTE — Procedures (Signed)
Pre-procedure Diagnosis: ESRD Post-procedure Diagnosis: Same  Successful placement of tunneled HD catheter with tips terminating within the superior aspect of the right atrium.    Complications: None Immediate  EBL: Minimal   The catheter is ready for immediate use.   Jay Icis Budreau, MD Pager #: [phone]   

## 2016-07-20 NOTE — Progress Notes (Signed)
Notified Lab of STAT blood work order.  Stated phlebotomist was sent to unit.   Will follow up   Paulla Fore, RN

## 2016-07-20 NOTE — Progress Notes (Addendum)
New Admission Note:   Arrival Method: direct admit from ED Mental Orientation: alert and orientated x4  Telemetry: n/a Assessment: Completed Skin: see flow sheet Iv: left wrist  Pain: denies  Tubes: none Safety Measures: Safety Fall Prevention Plan has been discussed  Admission: pending  [facility] [address] Orientation: Patient has been orientated to the room, unit and staff.  Family: n/a   MD Marily Memos has been notified of pt's arrival.   Awaiting orders. Will continue to monitor the patient. Call light has been placed within reach and bed alarm has been activated.   Emilio Math, RN [facility]  Phone number: [phone]

## 2016-07-20 NOTE — Consult Note (Signed)
Reason for Consult: Acute Kidney Injury  Referring Physician: Myrene Buddy M.D. [facility])  HPI: 71 year old African American woman with past medical history significant for hypertension, dyslipidemia, COPD (nonsmoker for several years), GERD, cervical disc disease and history of lung nodules with extensive workup including lung biopsy ruling out Sjogren's disease. She has had recurrent history of sinusitis since last fall and has been treated with several rounds of antibiotics and corticosteroids which by her report have not resulted in significant clinical improvement. She was also recently hospitalized 2 months ago for fever/weight loss and relative hypotension with anorexia and night sweats and was treated for community-acquired pneumonia. She has continued to have sinus drainage with some epistaxis/bloody secretions and now has had nausea with dysgeusia for the past weeks. She also suffers severe muscle pain/leg cramps that are impairing her ambulation.  Baseline creatinine prior to 2017 was 0.9 and in November 2017 creatinine was 1.1. In January 2018 it had risen to 1.7 and in February 2018 appeared to range between 1.6 and 2.1 with a peak of 2.8 that prompted referral to nephrology. After evaluation yesterday, she was found to have an elevated creatinine level of 12.4 and given the high risk of suspicion regarding vasculitis-direct admission was arranged for this morning.   Past Medical History:  Diagnosis Date  . Allergic rhinitis   . Anxiety   . COPD (chronic obstructive pulmonary disease) ([place]) 05/23/2012  . Diverticulosis   . DJD (degenerative joint disease)   . Esophageal diverticulum   . Esophageal stricture   . GERD (gastroesophageal reflux disease)   . Hiatal hernia   . Hyperlipidemia 05/23/2012   T. Chol 234, LDL 130   . Hypertension   . IBS (irritable bowel syndrome)   . Osteopenia   . Traumatic arthritis     Past Surgical History:  Procedure Laterality Date  .  CERVICAL SPINE SURGERY  2005  . LUNG BIOPSY Left    left side, not cancerous, thoracotomy  . VAGINAL HYSTERECTOMY      Family History  Problem Relation Age of Onset  . Throat cancer Father   . Hypertension Sister   . Thyroid disease Daughter   . Hypertension Sister     x 2  . Breast cancer Maternal Aunt   . Blindness Sister     legally blind  . Colon cancer Neg Hx   . Stomach cancer Neg Hx     Social History:  reports that she quit smoking about 31 years ago. Her smoking use included Cigarettes. She has never used smokeless tobacco. She reports that she drinks alcohol. She reports that she does not use drugs.  Allergies:  Allergies  Allergen Reactions  . Ace Inhibitors Anaphylaxis    REACTION: angioedema  . Other Anaphylaxis    Calcium Channel Blocking Agent Diltiazem Analogues  . Pneumococcal Vaccines Other (See Comments)    Red rash, Knot (sore and itches), A fever for a couple days (per pt report)  . Tetanus Toxoid Other (See Comments)    Cellulitis in arm    Medications:  Scheduled: . amLODipine  5 mg Oral Daily  . aspirin EC  81 mg Oral Daily  . atorvastatin  10 mg Oral Daily  . fluticasone  2 spray Each Nare QHS  . loratadine  10 mg Oral Daily  . mupirocin ointment   Nasal BID  . pantoprazole  40 mg Oral Daily  . sodium chloride  2 spray Each Nare TID  . sodium chloride flush  3  mL Intravenous Q12H    CMET from 07/19/16: Sodium 131, chloride 90, potassium 4.0, bicarbonate 23, BUN and 74, creatinine 12.4, calcium 8.7, albumin 2.8, AST 17, ALT 5 Thomas phosphorus 7.5, CBC from 07/19/16: Hemoglobin 10.3, hematocrit 29, white cell count 10,200, platelets 358,000 Urinalysis from 07/19/16:  specific gravity 1020, pH 5.5, cloudy appearance, 3+ protein, 3+ occult blood. Urine microscopy significant for numerous granular and pigmented granular casts without any obvious RBC casts although she had >30 RBCs/HPF.  No results found.  Review of Systems  Constitutional:  Positive for malaise/fatigue. Negative for fever.  HENT: Positive for congestion and nosebleeds. Negative for sinus pain.        Reports sinus pressure and dysgeusia  Eyes: Negative.   Respiratory: Positive for cough. Negative for hemoptysis, sputum production and shortness of breath.   Cardiovascular: Negative.   Gastrointestinal: Positive for nausea. Negative for abdominal pain, diarrhea and vomiting.       Dysgeusia  Genitourinary: Negative.   Musculoskeletal: Positive for back pain and myalgias.  Skin: Negative.   Neurological: Positive for sensory change, weakness and headaches. Negative for dizziness, tingling and focal weakness.  Psychiatric/Behavioral: The patient is nervous/anxious.    Blood pressure (!) 150/60, pulse 72, temperature 98 F (36.7 C), temperature source Oral, resp. rate 18, SpO2 100 %. Physical Exam  Nursing note and vitals reviewed. Constitutional: She is oriented to person, place, and time. She appears well-developed and well-nourished. No distress.  HENT:  Head: Normocephalic and atraumatic.  Nose: Nose normal.  Mouth/Throat: Oropharynx is clear and moist.  Eyes: Conjunctivae and EOM are normal. Pupils are equal, round, and reactive to light. No scleral icterus.  Neck: Normal range of motion. No JVD present.  Cardiovascular: Normal rate, regular rhythm and normal heart sounds.   No murmur heard. Respiratory: Effort normal and breath sounds normal. She has no wheezes. She has no rales.  GI: Soft. Bowel sounds are normal. There is tenderness. There is no rebound and no guarding.  Primarily over the right lower quadrant  Musculoskeletal: Normal range of motion. She exhibits no edema or deformity.  Neurological: She is alert and oriented to person, place, and time. No cranial nerve deficit.  Skin: Skin is warm and dry. No rash noted. No erythema.  Psychiatric: She has a normal mood and affect. Her behavior is normal.    Assessment/Plan: 1. Acute kidney  injury: High index of suspicion for ANCA associated vasculitis given activity in urinary sediment as well as preceding history/comorbid presentations. Serologies were drawn yesterday and are currently pending at this time and I will request interventional radiology for assistance with obtaining a kidney biopsy for definitive diagnosis. At this time, begin hemodialysis for her uremic symptoms. There has been benefit in literature for plasmapheresis to try and improve renal prognosis however data remains conflicted in the absence of alveolar hemorrhage/concomitant positive anti-GBM. I will begin her on high-dose methylprednisolone 1 g daily for the next 3 days and transitioned over to oral prednisone 60 mg daily with taper over the next 6 or so weeks. After results of renal biopsy obtained, will start her on oral Cytoxan. Await serologies and plan course of action accordingly. 2. Hypertension: This is likely compounded by anxiety and acute renal insufficiency. Continue current antihypertensive therapy and arrange for hemodialysis with some ultrafiltration today. 3. Anemia: Likely anemia of chronic disease with recent hospitalizations/vasculitis. Will check iron studies and decide on need for replacement prior to pursuing ESA therapy. 4. Hyperphosphatemia: Begin low phosphorus diet and  monitor with hemodialysis. Check PTH level.  Jeremie Abdelaziz K. 07/20/2016, 12:30 PM

## 2016-07-21 ENCOUNTER — Inpatient Hospital Stay (HOSPITAL_COMMUNITY): Payer: Medicare HMO

## 2016-07-21 DIAGNOSIS — D638 Anemia in other chronic diseases classified elsewhere: Secondary | ICD-10-CM

## 2016-07-21 LAB — CBC
HCT: 27.3 % — ABNORMAL LOW (ref 36.0–46.0)
Hemoglobin: 9 g/dL — ABNORMAL LOW (ref 12.0–15.0)
MCH: 27.8 pg (ref 26.0–34.0)
MCHC: 33 g/dL (ref 30.0–36.0)
MCV: 84.3 fL (ref 78.0–100.0)
Platelets: 227 10*3/uL (ref 150–400)
RBC: 3.24 MIL/uL — AB (ref 3.87–5.11)
RDW: 16.1 % — ABNORMAL HIGH (ref 11.5–15.5)
WBC: 8.4 10*3/uL (ref 4.0–10.5)

## 2016-07-21 LAB — COMPREHENSIVE METABOLIC PANEL
ALT: 6 U/L — AB (ref 14–54)
AST: 14 U/L — AB (ref 15–41)
Albumin: 1.7 g/dL — ABNORMAL LOW (ref 3.5–5.0)
Alkaline Phosphatase: 80 U/L (ref 38–126)
Anion gap: 17 — ABNORMAL HIGH (ref 5–15)
BILIRUBIN TOTAL: 1.2 mg/dL (ref 0.3–1.2)
BUN: 49 mg/dL — AB (ref 6–20)
CALCIUM: 7.8 mg/dL — AB (ref 8.9–10.3)
CO2: 20 mmol/L — ABNORMAL LOW (ref 22–32)
Chloride: 96 mmol/L — ABNORMAL LOW (ref 101–111)
Creatinine, Ser: 9.17 mg/dL — ABNORMAL HIGH (ref 0.44–1.00)
GFR, EST AFRICAN AMERICAN: 4 mL/min — AB (ref >60)
GFR, EST NON AFRICAN AMERICAN: 4 mL/min — AB (ref >60)
Glucose, Bld: 155 mg/dL — ABNORMAL HIGH (ref 65–99)
Potassium: 4.7 mmol/L (ref 3.5–5.1)
Sodium: 133 mmol/L — ABNORMAL LOW (ref 135–145)
TOTAL PROTEIN: 5.9 g/dL — AB (ref 6.5–8.1)

## 2016-07-21 LAB — HEPATITIS B SURFACE ANTIBODY,QUALITATIVE: Hep B S Ab: REACTIVE

## 2016-07-21 LAB — PARATHYROID HORMONE, INTACT (NO CA): PTH: 109 pg/mL — ABNORMAL HIGH (ref 15–65)

## 2016-07-21 LAB — HEPATITIS B SURFACE ANTIGEN: HEP B S AG: NEGATIVE

## 2016-07-21 LAB — MRSA PCR SCREENING: MRSA BY PCR: NEGATIVE

## 2016-07-21 LAB — HEPATITIS B CORE ANTIBODY, TOTAL: HEP B C TOTAL AB: NEGATIVE

## 2016-07-21 MED ORDER — MIDAZOLAM HCL 2 MG/2ML IJ SOLN
INTRAMUSCULAR | Status: AC | PRN
  Administered 2016-07-21: 1 mg via INTRAVENOUS

## 2016-07-21 MED ORDER — METHOCARBAMOL 1000 MG/10ML IJ SOLN
500.0000 mg | Freq: Three times a day (TID) | INTRAVENOUS | Status: DC | PRN
  Administered 2016-07-22: 500 mg via INTRAVENOUS
  Filled 2016-07-21 (×3): qty 5

## 2016-07-21 MED ORDER — LIDOCAINE HCL 1 % IJ SOLN
INTRAMUSCULAR | Status: AC
  Filled 2016-07-21: qty 20

## 2016-07-21 MED ORDER — MIDAZOLAM HCL 2 MG/2ML IJ SOLN
INTRAMUSCULAR | Status: AC
  Filled 2016-07-21: qty 2

## 2016-07-21 MED ORDER — FENTANYL CITRATE (PF) 100 MCG/2ML IJ SOLN
INTRAMUSCULAR | Status: AC
  Filled 2016-07-21: qty 2

## 2016-07-21 MED ORDER — FENTANYL CITRATE (PF) 100 MCG/2ML IJ SOLN
INTRAMUSCULAR | Status: AC | PRN
  Administered 2016-07-21: 50 ug via INTRAVENOUS

## 2016-07-21 MED ORDER — NEPRO/CARBSTEADY PO LIQD: 237.0000 mL | Freq: Two times a day (BID) | ORAL | Status: DC

## 2016-07-21 NOTE — Procedures (Signed)
Random renal Bx 16 g times two L lower pole No comp/EBL

## 2016-07-21 NOTE — Progress Notes (Signed)
Initial Nutrition Assessment  DOCUMENTATION CODES:   Not applicable  INTERVENTION:  Continue Nepro Shake po BID, each supplement provides 425 kcal and 19 grams protein.  Monitor magnesium, potassium, and phosphorus daily for at least 3 days, MD to replete as needed, as pt is at risk for refeeding syndrome given extreme poor po intake over the past 7 days.  RD to continue to monitor.   NUTRITION DIAGNOSIS:   Increased nutrient needs related to chronic illness as evidenced by estimated needs.  GOAL:   Patient will meet greater than or equal to 90% of their needs  MONITOR:   Supplement acceptance, Diet advancement, Labs, Weight trends, Skin, I & O's  REASON FOR ASSESSMENT:   Malnutrition Screening Tool    ASSESSMENT:   71 y.o. female with medical history significant for COPD, esophageal stricture, GERD, hiatal hernia, hyperlipidemia, hypertension, IBS, cervical disc disease and history of lung nodules with extensive workup including lung biopsy ruling out Sjogren's disease. She was hospitalized 2 months ago for fever/weight loss and relative hypotension with anorexia and night sweats and was treated for community-acquired pneumonia. She has continued to have sinus drainage with some epistaxis and bloody secretions and also had nausea with dysgeusia for the past few weeks or so. After evaluation 07/20/16, she was found to have an elevated creatinine level of 12.4 and given the high risk of suspicion for vasculitis  HD started yesterday. Pt is currently NPO for renal biopsy procedure today. Pt reports she has been experiencing a decreased appetite since November 2017. She reports her po intake has been decreased over time due to worsening taste changes and reports recently over the past week that she has not been able to keep po down. Pt unable to describe usual food intake at meals. Pt at risk for refeeding syndrome due to very little to no po intake over the past 7 days. Pt reports  usual body weight of ~160 lbs. Per weight records, pt with a 15% weight loss in 6 months from admission weight of 145 lbs. Pt currently has Nepro Shake ordered. RD to continue with current orders to aid in adequate nutrition. Nursing staff to provide once diet advances. Pt additionally reports frustration with renal diet as she does not know what she can and can not eat. RD provide handout "Eating Healthy with Kidney Disease" to patient and family member at bedside which lists foods to limit or avoid.   Pt with no observed significant fat or muscle mass loss.   Labs and medications reviewed.   Diet Order:  Diet NPO time specified Except for: Sips with Meds  Skin:  Wound (see comment) (wound on back)  Last BM:  3/11  Height:   Ht Readings from Last 1 Encounters:  07/21/16 5\' 3"  (1.6 m)    Weight:   Wt Readings from Last 1 Encounters:  07/21/16 129 lb 11.2 oz (58.8 kg)    Ideal Body Weight:  52.27 kg  BMI:  Body mass index is 22.98 kg/m.  Estimated Nutritional Needs:   Kcal:  1700-1900  Protein:  75-85 grams  Fluid:  Per MD  EDUCATION NEEDS:   Education needs addressed  Corrin Parker, MS, RD, LDN Pager # [phone] After hours/ weekend pager # [phone]

## 2016-07-21 NOTE — Progress Notes (Signed)
Notified MD Charlies Silvers of pt reporting pain 5/10 pain- top of right foot.   Dorsiflexion of right foot weak against resistance.   Absence of  warmth or swelling- in bilateral legs.   Will continue to monitor.   Paulla Fore, RN

## 2016-07-21 NOTE — Progress Notes (Signed)
Patient ID: [name], female   DOB: September 05, 1945, 71 y.o.   MRN: [id number] [facility] Progress Note   Assessment/ Plan:   1. Acute kidney injury: High index of suspicion for ANCA associated vasculitis given activity in urinary sediment as well as preceding history/comorbid presentations. Awaiting serologies and kidney biopsy later today for definitive diagnosis. 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 Also started on hemodialysis for uremic signs and symptoms. We will direct further management based on resulting labs. 2. Hypertension:  blood pressures improved with resumption of antihypertensive therapy/dialysis. 3. Anemia: Likely anemia of chronic disease with recent hospitalizations/vasculitis. She has low iron saturation with prohibitively high ferritin for intravenous iron bolusing. Begin Aranesp starting next dialysis treatment. 4. Hyperphosphatemia: Begin low phosphorus diet and monitor with hemodialysis. PTH level elevated, continue to monitor.  Subjective:   Reports from undergone hemodialysis yesterday with no problems. Scheduled for renal biopsy today-appreciate assistance from interventional radiology for all the assistance so far.    Objective:   BP (!) 124/59 (BP Location: Right Arm)   Pulse 69   Temp 98.2 F (36.8 C) (Oral)   Resp 17   Ht '5\' 3"'  (1.6 m)   Wt 58.8 kg (129 lb 11.2 oz)   SpO2 100%   BMI 22.98 kg/m   Intake/Output Summary (Last 24 hours) at 07/21/16 1027 Last data filed at 07/21/16 9562  Gross per 24 hour  Intake                3 ml  Output              300 ml  Net             -297 ml   Weight change:   Physical Exam: Gen: Comfortably resting in bed, daughter at bedside CVS: Pulse regular rhythm, normal S1 and S2 Resp: Clear to auscultation, no rales/rhonchi Abd: Soft, flat, nontender Ext: No lower extremity edema  Imaging: Ir Fluoro Guide Cv Line Right  Result Date: 07/20/2016 INDICATION:  End-stage renal disease. In need of durable intravenous access for the initiation of dialysis. EXAM: TUNNELED CENTRAL VENOUS HEMODIALYSIS CATHETER PLACEMENT WITH ULTRASOUND AND FLUOROSCOPIC GUIDANCE MEDICATIONS: Ancef 2 gm IV . The antibiotic was given in an appropriate time interval prior to skin puncture. ANESTHESIA/SEDATION: Versed 1 mg IV; Fentanyl 50 mcg IV; Moderate Sedation Time:  17 minutes The patient was continuously monitored during the procedure by the interventional radiology nurse under my direct supervision. FLUOROSCOPY TIME:  Fluoroscopy Time: 12 seconds (2 mGy). COMPLICATIONS: None immediate. PROCEDURE: Informed written consent was obtained from the patient after a discussion of the risks, benefits, and alternatives to treatment. Questions regarding the procedure were encouraged and answered. The right neck and chest were prepped with chlorhexidine in a sterile fashion, and a sterile drape was applied covering the operative field. Maximum barrier sterile technique with sterile gowns and gloves were used for the procedure. A timeout was performed prior to the initiation of the procedure. After creating a small venotomy incision, a micropuncture kit was utilized to access the right internal jugular vein under direct, real-time ultrasound guidance after the overlying soft tissues were anesthetized with 1% lidocaine with epinephrine. Ultrasound image documentation was performed. The microwire was kinked to measure appropriate catheter length. A stiff Glidewire was advanced to the level of the IVC and the micropuncture sheath was exchanged for a peel-away sheath. A Palindrome tunneled hemodialysis catheter measuring 19 cm from tip to cuff was tunneled in  a retrograde fashion from the anterior chest wall to the venotomy incision. The catheter was then placed through the peel-away sheath with tips ultimately positioned within the superior aspect of the right atrium. Final catheter positioning was confirmed  and documented with a spot radiographic image. The catheter aspirates and flushes normally. The catheter was flushed with appropriate volume heparin dwells. The catheter exit site was secured with a 0-Prolene retention suture. The venotomy incision was closed with an interrupted 4-0 Vicryl, Dermabond and Steri-strips. Dressings were applied. The patient tolerated the procedure well without immediate post procedural complication. IMPRESSION: Successful placement of 19 cm tip to cuff tunneled hemodialysis catheter via the right internal jugular vein with tips terminating within the superior aspect of the right atrium. The catheter is ready for immediate use. Electronically Signed   By: [name] M.D.   On: 07/20/2016 17:12   Ir US Guide Vasc Access Right  Result Date: 07/20/2016 INDICATION: End-stage renal disease. In need of durable intravenous access for the initiation of dialysis. EXAM: TUNNELED CENTRAL VENOUS HEMODIALYSIS CATHETER PLACEMENT WITH ULTRASOUND AND FLUOROSCOPIC GUIDANCE MEDICATIONS: Ancef 2 gm IV . The antibiotic was given in an appropriate time interval prior to skin puncture. ANESTHESIA/SEDATION: Versed 1 mg IV; Fentanyl 50 mcg IV; Moderate Sedation Time:  17 minutes The patient was continuously monitored during the procedure by the interventional radiology nurse under my direct supervision. FLUOROSCOPY TIME:  Fluoroscopy Time: 12 seconds (2 mGy). COMPLICATIONS: None immediate. PROCEDURE: Informed written consent was obtained from the patient after a discussion of the risks, benefits, and alternatives to treatment. Questions regarding the procedure were encouraged and answered. The right neck and chest were prepped with chlorhexidine in a sterile fashion, and a sterile drape was applied covering the operative field. Maximum barrier sterile technique with sterile gowns and gloves were used for the procedure. A timeout was performed prior to the initiation of the procedure. After creating a small  venotomy incision, a micropuncture kit was utilized to access the right internal jugular vein under direct, real-time ultrasound guidance after the overlying soft tissues were anesthetized with 1% lidocaine with epinephrine. Ultrasound image documentation was performed. The microwire was kinked to measure appropriate catheter length. A stiff Glidewire was advanced to the level of the IVC and the micropuncture sheath was exchanged for a peel-away sheath. A Palindrome tunneled hemodialysis catheter measuring 19 cm from tip to cuff was tunneled in a retrograde fashion from the anterior chest wall to the venotomy incision. The catheter was then placed through the peel-away sheath with tips ultimately positioned within the superior aspect of the right atrium. Final catheter positioning was confirmed and documented with a spot radiographic image. The catheter aspirates and flushes normally. The catheter was flushed with appropriate volume heparin dwells. The catheter exit site was secured with a 0-Prolene retention suture. The venotomy incision was closed with an interrupted 4-0 Vicryl, Dermabond and Steri-strips. Dressings were applied. The patient tolerated the procedure well without immediate post procedural complication. IMPRESSION: Successful placement of 19 cm tip to cuff tunneled hemodialysis catheter via the right internal jugular vein with tips terminating within the superior aspect of the right atrium. The catheter is ready for immediate use. Electronically Signed   By: [name] M.D.   On: 07/20/2016 17:12   Ct Maxillofacial Wo Contrast  Result Date: 07/20/2016 CLINICAL DATA:  71 y/o  F; recurrent sinusitis. EXAM: CT MAXILLOFACIAL WITHOUT CONTRAST TECHNIQUE: Multidetector CT imaging of the maxillofacial structures was performed. Multiplanar CT image reconstructions were  also generated. A small metallic BB was placed on the right temple in order to reliably differentiate right from left. COMPARISON:   03/13/2016 MRI head.  12/02/2015 CT head. FINDINGS: Frontal sinus: Normally aerated. Patent frontal sinus drainage pathways. Ethmoid sinus: Normally aerated. Maxillary sinuses:  Normally aerated. Sphenoid sinus: Normally aerated. Patent sphenoid ethmoidal recesses. Right ostiomeatal unit:  Patent. Left ostiomeatal unit:  Patent. Nasal passages: Patent. Intact nasal septum. Mild leftward nasal septal deviation and small spur. Other: Orbits and intracranial compartment are unremarkable. Mastoid air cells are normally pneumatized. IMPRESSION: Normally aerated paranasal sinuses and patent sinus drainage pathways. Electronically Signed   By: Kristine Garbe M.D.   On: 07/20/2016 14:22    Labs: BMET  Recent Labs Lab 07/20/16 1250 07/21/16 0543  NA 135 133*  K 3.8 4.7  CL 93* 96*  CO2 23 20*  GLUCOSE 83 155*  BUN 84* 49*  CREATININE 13.91* 9.17*  CALCIUM 8.2* 7.8*   CBC  Recent Labs Lab 07/20/16 1250 07/21/16 0543  WBC 8.6 8.4  NEUTROABS 6.4  --   HGB 9.3* 9.0*  HCT 28.1* 27.3*  MCV 84.1 84.3  PLT 283 227   Medications:    . amLODipine  5 mg Oral Daily  . aspirin EC  81 mg Oral Daily  . atorvastatin  10 mg Oral q1800  . fluticasone  2 spray Each Nare QHS  . loratadine  10 mg Oral Daily  . methylPREDNISolone (SOLU-MEDROL) injection  1,000 mg Intravenous Daily   Followed by  . [START ON 07/23/2016] predniSONE  60 mg Oral Q breakfast  . mupirocin ointment   Nasal BID  . pantoprazole  40 mg Oral Daily  . sodium chloride  2 spray Each Nare TID  . sodium chloride flush  3 mL Intravenous Q12H   Elmarie Shiley, MD 07/21/2016, 10:27 AM

## 2016-07-21 NOTE — Progress Notes (Signed)
Dialysis treatment completed.  800 mL ultrafiltrated.  300 mL net fluid removal.  Patient status unchanged. Lung sounds diminished to ausculation in all fields. No edema. Cardiac: NSR.  Cleansed RIJ catheter with chlorhexidine.  Disconnected lines and flushed ports with saline per protocol.  Ports locked with heparin and capped per protocol.    Report given to bedside, RN Joaquim Lai.  Treatment terminated with 30 minutes remaining per Neprhology d/t clotted venous chamber.

## 2016-07-21 NOTE — Sedation Documentation (Signed)
Patient is resting comfortably. 

## 2016-07-21 NOTE — Progress Notes (Signed)
Pt seen for [place] random renal biopsy request. Successful Permcath placement yesterday, had treatment, no issues. Feels better this am. Daughter at bedside BP (!) 124/59 (BP Location: Right Arm)   Pulse 69   Temp 98.2 F (36.8 C) (Oral)   Resp 17   Ht 5\' 3"  (1.6 m)   Wt 129 lb 11.2 oz (58.8 kg)   SpO2 100%   BMI 22.98 kg/m  BP well controlled this am.  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 All of the patient's questions were answered, patient is agreeable to proceed. Consent signed and in chart.  Ascencion Dike PA-C Interventional Radiology 07/21/2016 9:44 AM

## 2016-07-21 NOTE — Progress Notes (Signed)
Pt arrived to unit alert and  x4. Denies any pain.   Site on Left lower back assessed no complications.  Paulla Fore, RN

## 2016-07-21 NOTE — Progress Notes (Addendum)
Patient ID: [name], female   DOB: 10-11-1945, 71 y.o.   MRN: [id number]  PROGRESS NOTE    [name]  FQH:[id number] DOB: [birth year] DOA: 07/20/2016  PCP: Merrilee Seashore, MD   Brief Narrative:  71 y.o. female with medical history significant for COPD, esophageal stricture, GERD, hiatal hernia, hyperlipidemia, hypertension, IBS, cervical disc disease and history of lung nodules with extensive workup including lung biopsy ruling out Sjogren's disease. Pt has had recurrent history of sinusitis since last fall, treated with several rounds of antibiotics and corticosteroids which have not resulted in significant clinical improvement. She was hospitalized 2 months ago for fever/weight loss and relative hypotension with anorexia and night sweats and was treated for community-acquired pneumonia. She has continued to have sinus drainage with some epistaxis and bloody secretions and also had nausea with dysgeusia for the past few weeks or so. Pt also reported severe muscle pain and leg cramps over the same time period. Patient's creatinine in January of 2018 was 1.7 and in February 2018 it ranged between 1.6 and 2.1 with a peak of 2.8 that prompted referral to nephrology. After evaluation 07/20/16, she was found to have an elevated creatinine level of 12.4 and given the high risk of suspicion for vasculitis pt was directly admitted to hospital for evaluation.   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 After results of renal biopsy obtained, she will be started on oral Cytoxan) -  Appreciate very much renal team following and their recommendations   Essential hypertension - Continue Norvasc   Anemia of chronic renal disease - ferritin 1163, iron 25, TIBC 133 - consistent with anemia of chronic disease  Hyperphosphatemia - Low phosphorus diet, monitor with hemodialysis - PTH level 109  Dyslipidemia - Continue Lipitor    DVT prophylaxis: SCD's bilaterally  Code Status: full code  Family Communication: no family at the bedside this am Disposition Plan: home once renal function improves    Consultants:   Nephrology  IR  Procedures:   RIJ cathter for HD placed 07/20/2016  Antimicrobials:   None    Subjective: No overnight events.   Objective: Vitals:   07/21/16 0024 07/21/16 0053 07/21/16 0524 07/21/16 0937  BP: (!) 116/59 (!) 120/52 138/60 (!) 124/59  Pulse: 64 72 68 69  Resp:  '16 16 17  ' Temp:  97.8 F (36.6 C) 97.5 F (36.4 C) 98.2 F (36.8 C)  TempSrc:  Oral Oral Oral  SpO2:  100% 100% 100%  Weight:  58.8 kg (129 lb 11.2 oz)    Height:  '5\' 3"'  (1.6 m)      Intake/Output Summary (Last 24 hours) at 07/21/16 1031 Last data filed at 07/21/16 0938  Gross per 24 hour  Intake                3 ml  Output              300 ml  Net             -297 ml   [employer]  07/20/16 2216 07/21/16 0022 07/21/16 0053  Weight: 65.8 kg (145 lb 1 oz) 65.5 kg (144 lb 6.4 oz) 58.8 kg (129 lb 11.2 oz)    Examination:  General exam: Appears calm and comfortable  Respiratory system: Clear to auscultation. Respiratory effort normal. Cardiovascular system: S1 & S2 heard, RRR; has right IJ tunneled cathter placed Gastrointestinal system: Abdomen is nondistended, soft and nontender. No organomegaly or masses felt. Normal bowel sounds heard. Central nervous system: Alert and oriented. No focal neurological deficits. Extremities: Symmetric 5 x 5 power. Skin: No rashes, lesions or ulcers Psychiatry: Judgement and insight appear normal. Mood & affect  appropriate.   Data Reviewed: I have personally reviewed following labs and imaging studies  CBC:  Recent Labs Lab 07/20/16 1250 07/21/16 0543  WBC 8.6 8.4  NEUTROABS 6.4  --   HGB 9.3* 9.0*  HCT 28.1* 27.3*  MCV 84.1 84.3  PLT 283 161   Basic Metabolic Panel:  Recent Labs Lab 07/20/16 1250 07/21/16 0543  NA 135 133*  K 3.8 4.7  CL 93* 96*  CO2 23 20*  GLUCOSE 83 155*  BUN 84* 49*  CREATININE 13.91* 9.17*  CALCIUM 8.2* 7.8*   GFR: Estimated Creatinine Clearance: 4.6 mL/min (A) (by C-G formula based on SCr of 9.17 mg/dL (H)). Liver Function Tests:  Recent Labs Lab 07/20/16 1250 07/21/16 0543  AST 15 14*  ALT 8* 6*  ALKPHOS 88 80  BILITOT 1.1 1.2  PROT 6.5 5.9*  ALBUMIN 2.0* 1.7*   No results for input(s): LIPASE, AMYLASE in the last 168 hours. No results for input(s): AMMONIA in the last 168 hours. Coagulation Profile:  Recent Labs Lab 07/20/16 1250  INR 1.36   Cardiac Enzymes: No results for input(s): CKTOTAL, CKMB, CKMBINDEX, TROPONINI in the last 168 hours. BNP (last 3 results) No results for input(s): PROBNP in the last 8760 hours. HbA1C: No results for input(s): HGBA1C in the last 72 hours. CBG:  Recent Labs Lab 07/20/16 0916  GLUCAP 91   Lipid Profile: No results for input(s): CHOL, HDL, LDLCALC, TRIG, CHOLHDL, LDLDIRECT in the last 72 hours. Thyroid Function Tests: No results for input(s): TSH, T4TOTAL, FREET4, T3FREE, THYROIDAB in the last 72 hours. Anemia Panel:  Recent Labs  07/20/16 1838  FERRITIN 1,163*  TIBC 133*  IRON 25*   Urine analysis:    Component Value Date/Time   COLORURINE STRAW (A) 06/20/2016 0129   APPEARANCEUR CLEAR 06/20/2016 0129   LABSPEC 1.008 06/20/2016 0129   PHURINE 7.0 06/20/2016 0129   GLUCOSEU NEGATIVE 06/20/2016 0129   GLUCOSEU NEGATIVE 06/03/2014 1038   HGBUR MODERATE (A) 06/20/2016 0129   BILIRUBINUR NEGATIVE 06/20/2016 0129   KETONESUR NEGATIVE 06/20/2016 0129   PROTEINUR NEGATIVE  06/20/2016 0129   UROBILINOGEN 0.2 06/03/2014 1038   NITRITE NEGATIVE 06/20/2016 0129   LEUKOCYTESUR TRACE (A) 06/20/2016 0129   Sepsis Labs: '@LABRCNTIP' (procalcitonin:4,lacticidven:4)   )No results found for this or any previous visit (from the past 240 hour(s)).    Radiology Studies: Ir Fluoro Guide Cv Line Right Result Date: 07/20/2016 Successful placement of 19 cm tip to cuff tunneled hemodialysis catheter via the right internal jugular vein with tips terminating within the superior aspect of the right atrium. The catheter is ready for immediate use.  Ir US Guide Vasc Access Right Result Date: 07/20/2016 Successful placement of 19 cm tip to cuff tunneled hemodialysis catheter via the right internal jugular vein with tips terminating within the superior aspect of the right atrium. The catheter is ready  for immediate use.  Ct Maxillofacial Wo Contrast Result Date: 07/20/2016 Normally aerated paranasal sinuses and patent sinus drainage pathways.     Scheduled Meds: . amLODipine  5 mg Oral Daily  . aspirin EC  81 mg Oral Daily  . atorvastatin  10 mg Oral q1800  . fluticasone  2 spray Each Nare QHS  . loratadine  10 mg Oral Daily  . methylPREDNISolone (SOLU-MEDROL) injection  1,000 mg Intravenous Daily   Followed by  . [START ON 07/23/2016] predniSONE  60 mg Oral Q breakfast  . mupirocin ointment   Nasal BID  . pantoprazole  40 mg Oral Daily  . sodium chloride  2 spray Each Nare TID  . sodium chloride flush  3 mL Intravenous Q12H   Continuous Infusions:   LOS: 1 day    Time spent: 25 minutes  Greater than 50% of the time spent on counseling and coordinating the care.   Leisa Lenz, MD [facility] Pager [phone]  If 7PM-7AM, please contact night-coverage [email or web address] Password [facility] 07/21/2016, 10:31 AM

## 2016-07-22 LAB — CBC
HCT: 24.6 % — ABNORMAL LOW (ref 36.0–46.0)
Hemoglobin: 8 g/dL — ABNORMAL LOW (ref 12.0–15.0)
MCH: 27.6 pg (ref 26.0–34.0)
MCHC: 32.5 g/dL (ref 30.0–36.0)
MCV: 84.8 fL (ref 78.0–100.0)
PLATELETS: 239 10*3/uL (ref 150–400)
RBC: 2.9 MIL/uL — AB (ref 3.87–5.11)
RDW: 16.7 % — ABNORMAL HIGH (ref 11.5–15.5)
WBC: 8.6 10*3/uL (ref 4.0–10.5)

## 2016-07-22 LAB — BASIC METABOLIC PANEL
ANION GAP: 16 — AB (ref 5–15)
BUN: 28 mg/dL — ABNORMAL HIGH (ref 6–20)
CHLORIDE: 95 mmol/L — AB (ref 101–111)
CO2: 25 mmol/L (ref 22–32)
Calcium: 8.2 mg/dL — ABNORMAL LOW (ref 8.9–10.3)
Creatinine, Ser: 4.89 mg/dL — ABNORMAL HIGH (ref 0.44–1.00)
GFR, EST AFRICAN AMERICAN: 9 mL/min — AB (ref >60)
GFR, EST NON AFRICAN AMERICAN: 8 mL/min — AB (ref >60)
Glucose, Bld: 164 mg/dL — ABNORMAL HIGH (ref 65–99)
POTASSIUM: 4 mmol/L (ref 3.5–5.1)
Sodium: 136 mmol/L (ref 135–145)

## 2016-07-22 MED ORDER — CALCIUM CARBONATE ANTACID 500 MG PO CHEW: 2.0000 | CHEWABLE_TABLET | ORAL | Status: AC

## 2016-07-22 MED ORDER — DARBEPOETIN ALFA 60 MCG/0.3ML IJ SOSY
60.0000 ug | PREFILLED_SYRINGE | INTRAMUSCULAR | Status: DC
  Administered 2016-07-23: 60 ug via INTRAVENOUS
  Filled 2016-07-22: qty 0.3

## 2016-07-22 MED ORDER — SODIUM CHLORIDE 0.9 % IV SOLN
INTRAVENOUS | Status: AC
  Filled 2016-07-22 (×3): qty 200

## 2016-07-22 MED ORDER — LORAZEPAM 0.5 MG PO TABS
0.5000 mg | ORAL_TABLET | Freq: Once | ORAL | Status: AC
  Administered 2016-07-23: 0.5 mg via ORAL
  Filled 2016-07-22: qty 1

## 2016-07-22 MED ORDER — ALBUMIN HUMAN 5 % IV SOLN: 12.5000 g | INTRAVENOUS | Status: DC

## 2016-07-22 MED ORDER — ACD FORMULA A 0.73-2.45-2.2 GM/100ML VI SOLN
Status: AC
  Filled 2016-07-22: qty 500

## 2016-07-22 MED ORDER — DIPHENHYDRAMINE HCL 25 MG PO CAPS: 25.0000 mg | ORAL_CAPSULE | Freq: Four times a day (QID) | ORAL | Status: DC | PRN

## 2016-07-22 MED ORDER — HEPARIN SODIUM (PORCINE) 1000 UNIT/ML IJ SOLN: 1000.0000 [IU] | Freq: Once | INTRAMUSCULAR | Status: DC

## 2016-07-22 MED ORDER — ACD FORMULA A 0.73-2.45-2.2 GM/100ML VI SOLN
500.0000 mL | Status: DC
  Filled 2016-07-22: qty 500

## 2016-07-22 MED ORDER — ACETAMINOPHEN 325 MG PO TABS: 650.0000 mg | ORAL_TABLET | ORAL | Status: DC | PRN

## 2016-07-22 MED ORDER — HEPARIN SODIUM (PORCINE) 1000 UNIT/ML DIALYSIS: 40.0000 [IU]/kg | INTRAMUSCULAR | Status: DC | PRN

## 2016-07-22 MED ORDER — SODIUM CHLORIDE 0.9 % IV SOLN
4.0000 g | Freq: Once | INTRAVENOUS | Status: DC
  Filled 2016-07-22 (×3): qty 40

## 2016-07-22 NOTE — Progress Notes (Signed)
Patient ID: [name], female   DOB: 06/11/1945, 71 y.o.   MRN: [id number] [facility] Progress Note   Assessment/ Plan:   1. 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 Earlier this morning, I received a call from Dr. Lorrene Reid who informed me that in addition to positive myeloperoxidase antibodies, the patient also has anti-GBM positive status. (I have placed these labs in her shadow chart). As recommended for treatment guidelines, she will need to begin plasmapheresis which I will prescribe for every other day for at least 2 weeks. She will transition to oral prednisone from intravenous Solu-Medrol tomorrow and I will begin oral cyclophosphamide once results of biopsy are available. 2. Hypertension:  blood pressures improved with resumption of antihypertensive therapy/dialysis. 3. Anemia: Likely anemia of chronic disease with recent hospitalizations/vasculitis. She has low iron saturation with prohibitively high ferritin for intravenous iron bolusing. Begin Aranesp starting next dialysis treatment. 4. Hyperphosphatemia: Begin low phosphorus diet and monitor with hemodialysis. PTH level elevated, continue to monitor.  Subjective:   Status post 2 dialysis treatments over the past 2 days, will begin plasmapheresis today. Appreciate assistance from interventional radiology with placement of dialysis catheter and kidney biopsy. She continues to report dysgeusia and sinus pressure.    Objective:   BP (!) 125/56 (BP Location: Right Arm)   Pulse 68   Temp 97.8 F (36.6 C) (Oral)   Resp 16   Ht _0  (1.6 m)   Wt 61 kg (134 lb 8 oz)   SpO2 100%   BMI 23.83 kg/m   Intake/Output Summary (Last 24 hours) at 07/22/16 0910 Last data filed at 07/22/16 0606  Gross per 24 hour  Intake              599 ml  Output              201 ml  Net               398 ml   Weight change: -3.6 kg (-7 lb 15 oz)  Physical Exam: Gen: Comfortably resting in bed CVS: Pulse regular rhythm, normal S1 and S2 Resp: Clear to auscultation, no rales/rhonchi Abd: Soft, flat, nontender Ext: No lower extremity edema  Imaging: US Biopsy  Result Date: 07/21/2016 INDICATION: Acute renal failure EXAM: ULTRASOUND-GUIDED BIOPSY OF THE RENAL CORTEX.  RANDOM CORE. MEDICATIONS: None. ANESTHESIA/SEDATION: Fentanyl 50 mcg IV; Versed 1 mg IV Moderate Sedation Time:  10 The patient was continuously monitored during the procedure by the interventional radiology nurse under my direct supervision. FLUOROSCOPY TIME:  None. COMPLICATIONS: None immediate. PROCEDURE: Informed written consent was obtained from the patient after a thorough discussion of the procedural risks, benefits and alternatives. All questions were addressed. Maximal Sterile Barrier Technique was utilized including caps, mask, sterile gowns, sterile gloves, sterile drape, hand hygiene and skin antiseptic. A timeout was performed prior to the initiation of the procedure. The back was prepped with ChloraPrep in a sterile fashion, and a sterile drape was applied covering the operative field. A sterile gown and sterile gloves were used for the procedure. Under sonographic guidance, 2 16 gauge core biopsies of the cortex of the lower pole of the left kidney were obtained. Final imaging was performed. Patient tolerated the procedure well without complication. Vital sign monitoring by nursing staff during the procedure will continue as patient is in the special procedures unit for post procedure  observation. FINDINGS: The images document guide needle placement within the left lower pole renal cortex. Post biopsy images demonstrate no evidence of hemorrhage. IMPRESSION: Successful ultrasound-guided random renal cortex core biopsy. Electronically Signed   By: Marybelle Killings M.D.   On: 07/21/2016 14:36   Ir Fluoro Guide Cv  Line Right  Result Date: 07/20/2016 INDICATION: End-stage renal disease. In need of durable intravenous access for the initiation of dialysis. EXAM: TUNNELED CENTRAL VENOUS HEMODIALYSIS CATHETER PLACEMENT WITH ULTRASOUND AND FLUOROSCOPIC GUIDANCE MEDICATIONS: Ancef 2 gm IV . The antibiotic was given in an appropriate time interval prior to skin puncture. ANESTHESIA/SEDATION: Versed 1 mg IV; Fentanyl 50 mcg IV; Moderate Sedation Time:  17 minutes The patient was continuously monitored during the procedure by the interventional radiology nurse under my direct supervision. FLUOROSCOPY TIME:  Fluoroscopy Time: 12 seconds (2 mGy). COMPLICATIONS: None immediate. PROCEDURE: Informed written consent was obtained from the patient after a discussion of the risks, benefits, and alternatives to treatment. Questions regarding the procedure were encouraged and answered. The right neck and chest were prepped with chlorhexidine in a sterile fashion, and a sterile drape was applied covering the operative field. Maximum barrier sterile technique with sterile gowns and gloves were used for the procedure. A timeout was performed prior to the initiation of the procedure. After creating a small venotomy incision, a micropuncture kit was utilized to access the right internal jugular vein under direct, real-time ultrasound guidance after the overlying soft tissues were anesthetized with 1% lidocaine with epinephrine. Ultrasound image documentation was performed. The microwire was kinked to measure appropriate catheter length. A stiff Glidewire was advanced to the level of the IVC and the micropuncture sheath was exchanged for a peel-away sheath. A Palindrome tunneled hemodialysis catheter measuring 19 cm from tip to cuff was tunneled in a retrograde fashion from the anterior chest wall to the venotomy incision. The catheter was then placed through the peel-away sheath with tips ultimately positioned within the superior aspect of the  right atrium. Final catheter positioning was confirmed and documented with a spot radiographic image. The catheter aspirates and flushes normally. The catheter was flushed with appropriate volume heparin dwells. The catheter exit site was secured with a 0-Prolene retention suture. The venotomy incision was closed with an interrupted 4-0 Vicryl, Dermabond and Steri-strips. Dressings were applied. The patient tolerated the procedure well without immediate post procedural complication. IMPRESSION: Successful placement of 19 cm tip to cuff tunneled hemodialysis catheter via the right internal jugular vein with tips terminating within the superior aspect of the right atrium. The catheter is ready for immediate use. Electronically Signed   By: [name] M.D.   On: 07/20/2016 17:12   Ir US Guide Vasc Access Right  Result Date: 07/20/2016 INDICATION: End-stage renal disease. In need of durable intravenous access for the initiation of dialysis. EXAM: TUNNELED CENTRAL VENOUS HEMODIALYSIS CATHETER PLACEMENT WITH ULTRASOUND AND FLUOROSCOPIC GUIDANCE MEDICATIONS: Ancef 2 gm IV . The antibiotic was given in an appropriate time interval prior to skin puncture. ANESTHESIA/SEDATION: Versed 1 mg IV; Fentanyl 50 mcg IV; Moderate Sedation Time:  17 minutes The patient was continuously monitored during the procedure by the interventional radiology nurse under my direct supervision. FLUOROSCOPY TIME:  Fluoroscopy Time: 12 seconds (2 mGy). COMPLICATIONS: None immediate. PROCEDURE: Informed written consent was obtained from the patient after a discussion of the risks, benefits, and alternatives to treatment. Questions regarding the procedure were encouraged and answered. The right neck and chest were prepped with chlorhexidine in a sterile  fashion, and a sterile drape was applied covering the operative field. Maximum barrier sterile technique with sterile gowns and gloves were used for the procedure. A timeout was performed prior to  the initiation of the procedure. After creating a small venotomy incision, a micropuncture kit was utilized to access the right internal jugular vein under direct, real-time ultrasound guidance after the overlying soft tissues were anesthetized with 1% lidocaine with epinephrine. Ultrasound image documentation was performed. The microwire was kinked to measure appropriate catheter length. A stiff Glidewire was advanced to the level of the IVC and the micropuncture sheath was exchanged for a peel-away sheath. A Palindrome tunneled hemodialysis catheter measuring 19 cm from tip to cuff was tunneled in a retrograde fashion from the anterior chest wall to the venotomy incision. The catheter was then placed through the peel-away sheath with tips ultimately positioned within the superior aspect of the right atrium. Final catheter positioning was confirmed and documented with a spot radiographic image. The catheter aspirates and flushes normally. The catheter was flushed with appropriate volume heparin dwells. The catheter exit site was secured with a 0-Prolene retention suture. The venotomy incision was closed with an interrupted 4-0 Vicryl, Dermabond and Steri-strips. Dressings were applied. The patient tolerated the procedure well without immediate post procedural complication. IMPRESSION: Successful placement of 19 cm tip to cuff tunneled hemodialysis catheter via the right internal jugular vein with tips terminating within the superior aspect of the right atrium. The catheter is ready for immediate use. Electronically Signed   By: [name] M.D.   On: 07/20/2016 17:12   Ct Maxillofacial Wo Contrast  Result Date: 07/20/2016 CLINICAL DATA:  71 y/o  F; recurrent sinusitis. EXAM: CT MAXILLOFACIAL WITHOUT CONTRAST TECHNIQUE: Multidetector CT imaging of the maxillofacial structures was performed. Multiplanar CT image reconstructions were also generated. A small metallic BB was placed on the right temple in order to  reliably differentiate right from left. COMPARISON:  03/13/2016 MRI head.  12/02/2015 CT head. FINDINGS: Frontal sinus: Normally aerated. Patent frontal sinus drainage pathways. Ethmoid sinus: Normally aerated. Maxillary sinuses:  Normally aerated. Sphenoid sinus: Normally aerated. Patent sphenoid ethmoidal recesses. Right ostiomeatal unit:  Patent. Left ostiomeatal unit:  Patent. Nasal passages: Patent. Intact nasal septum. Mild leftward nasal septal deviation and small spur. Other: Orbits and intracranial compartment are unremarkable. Mastoid air cells are normally pneumatized. IMPRESSION: Normally aerated paranasal sinuses and patent sinus drainage pathways. Electronically Signed   By: Kristine Garbe M.D.   On: 07/20/2016 14:22    Labs: BMET  Recent Labs Lab 07/20/16 1250 07/21/16 0543 07/22/16 0739  NA 135 133* 136  K 3.8 4.7 4.0  CL 93* 96* 95*  CO2 23 20* 25  GLUCOSE 83 155* 164*  BUN 84* 49* 28*  CREATININE 13.91* 9.17* 4.89*  CALCIUM 8.2* 7.8* 8.2*   CBC  Recent Labs Lab 07/20/16 1250 07/21/16 0543 07/22/16 0739  WBC 8.6 8.4 8.6  NEUTROABS 6.4  --   --   HGB 9.3* 9.0* 8.0*  HCT 28.1* 27.3* 24.6*  MCV 84.1 84.3 84.8  PLT 283 227 239   Medications:    . amLODipine  5 mg Oral Daily  . aspirin EC  81 mg Oral Daily  . atorvastatin  10 mg Oral q1800  . feeding supplement (NEPRO CARB STEADY)  237 mL Oral BID BM  . fluticasone  2 spray Each Nare QHS  . loratadine  10 mg Oral Daily  . methylPREDNISolone (SOLU-MEDROL) injection  1,000 mg Intravenous Daily  Followed by  . [START ON 07/23/2016] predniSONE  60 mg Oral Q breakfast  . mupirocin ointment   Nasal BID  . pantoprazole  40 mg Oral Daily  . sodium chloride  2 spray Each Nare TID  . sodium chloride flush  3 mL Intravenous Q12H   Elmarie Shiley, MD 07/22/2016, 9:10 AM

## 2016-07-22 NOTE — Progress Notes (Signed)
Patient ID: [name], female   DOB: 27-Jul-1945, 71 y.o.   MRN: [id number]  PROGRESS NOTE    [name]  [id number] DOB: [birth year] DOA: 07/20/2016  PCP: Merrilee Seashore, MD   Brief Narrative:  70 y.o. female with medical history significant for COPD, esophageal stricture, GERD, hiatal hernia, hyperlipidemia, hypertension, IBS, cervical disc disease and history of lung nodules with extensive workup including lung biopsy ruling out Sjogren's disease. Pt has had recurrent history of sinusitis since last fall, treated with several rounds of antibiotics and corticosteroids which have not resulted in significant clinical improvement. She was hospitalized 2 months ago for fever/weight loss and relative hypotension with anorexia and night sweats and was treated for community-acquired pneumonia. She has continued to have sinus drainage with some epistaxis and bloody secretions and also had nausea with dysgeusia for the past few weeks or so. Pt also reported severe muscle pain and leg cramps over the same time period. Patient's creatinine in January of 2018 was 1.7 and in February 2018 it ranged between 1.6 and 2.1 with a peak of 2.8 that prompted referral to nephrology. After evaluation 07/20/16, she was found to have an elevated creatinine level of 12.4 and given the high risk of suspicion for vasculitis pt was directly admitted to hospital for evaluation.   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 (per renal 1 g daily for 3 days and transition over to oral prednisone 60 mg daily with taper over 6 weeks or so) - She will need plasmapheresis every other day for 2 weeks and  she will begin cyclophosphamide once results of renal biopsy are available  - Hep B negative - ESR and CRP elevated  - Appreciate very much renal team following and their recommendations   Essential hypertension - Continue Norvasc   Anemia of chronic renal disease - Ferritin 1163, iron 25, TIBC 133 - consistent with anemia of chronic disease  Hyperphosphatemia - Low phosphorus diet, monitor with hemodialysis - PTH level 109  Dyslipidemia - Continue Lipitor    DVT prophylaxis: SCD's bilaterally  Code Status: full code  Family Communication: no family at the bedside this am Disposition Plan: home once cleared by renal    Consultants:   Nephrology  IR  Procedures:   RIJ cathter for HD placed 07/20/2016  Renal biopsy 07/21/2016  Antimicrobials:   None    Subjective: No overnight events.   Objective: Vitals:   07/21/16 2006 07/21/16 2144 07/22/16 0603 07/22/16 0800  BP: (!) 107/55 (!) 112/59 131/62 (!) 125/56  Pulse: 65 66 70 68  Resp: _0 Temp: 98 F (36.7 C) 97.8 F (36.6 C) 97.8 F (36.6 C)   TempSrc: Oral Oral Oral   SpO2: 100% 100% 100% 100%  Weight: 62 kg (136 lb 11 oz) 61 kg (134 lb 8 oz)    Height:        Intake/Output Summary (Last 24 hours) at 07/22/16 0904 Last data filed at 07/22/16 0606  Gross per 24 hour  Intake              599 ml  Output  201 ml  Net              398 ml   Filed Weights   07/21/16 1705 07/21/16 2006 07/21/16 2144  Weight: 62.2 kg (137 lb 2 oz) 62 kg (136 lb 11 oz) 61 kg (134 lb 8 oz)    Examination:  General exam: Appears calm and comfortable, no distress  Respiratory system: No wheezing, no rhonchi  Cardiovascular system: S1 & S2 heard, Rate controlled; has right IJ tunneled cathter placed Gastrointestinal system: (+) BS, non tender  Central nervous system: No focal neurological deficits. Extremities: No edema, palpable pulses  Skin: warm and dry  Psychiatry: Mood & affect appropriate.    Data Reviewed: I have personally reviewed following labs and imaging studies  CBC:  Recent Labs Lab 07/20/16 1250 07/21/16 0543 07/22/16 0739  WBC 8.6 8.4 8.6  NEUTROABS 6.4  --   --   HGB 9.3* 9.0* 8.0*  HCT 28.1* 27.3* 24.6*  MCV 84.1 84.3 84.8  PLT 283 227 893   Basic Metabolic Panel:  Recent Labs Lab 07/20/16 1250 07/21/16 0543 07/22/16 0739  NA 135 133* 136  K 3.8 4.7 4.0  CL 93* 96* 95*  CO2 23 20* 25  GLUCOSE 83 155* 164*  BUN 84* 49* 28*  CREATININE 13.91* 9.17* 4.89*  CALCIUM 8.2* 7.8* 8.2*   GFR: Estimated Creatinine Clearance: 8.6 mL/min (A) (by C-G formula based on SCr of 4.89 mg/dL (H)). Liver Function Tests:  Recent Labs Lab 07/20/16 1250 07/21/16 0543  AST 15 14*  ALT 8* 6*  ALKPHOS 88 80  BILITOT 1.1 1.2  PROT 6.5 5.9*  ALBUMIN 2.0* 1.7*   No results for input(s): LIPASE, AMYLASE in the last 168 hours. No results for input(s): AMMONIA in the last 168 hours. Coagulation Profile:  Recent Labs Lab 07/20/16 1250  INR 1.36   Cardiac Enzymes: No results for input(s): CKTOTAL, CKMB, CKMBINDEX, TROPONINI in the last 168 hours. BNP (last 3 results) No results for input(s): PROBNP in the last 8760 hours. HbA1C: No results for input(s): HGBA1C in the last 72 hours. CBG:  Recent Labs Lab 07/20/16 0916  GLUCAP 91   Lipid Profile: No results for input(s): CHOL, HDL, LDLCALC, TRIG, CHOLHDL, LDLDIRECT in the last 72 hours. Thyroid Function Tests: No results for input(s): TSH, T4TOTAL, FREET4, T3FREE, THYROIDAB in the last 72 hours. Anemia Panel:  Recent Labs  07/20/16 1838  FERRITIN 1,163*  TIBC 133*  IRON 25*   Urine analysis:    Component Value Date/Time   COLORURINE STRAW (A) 06/20/2016 0129   APPEARANCEUR CLEAR 06/20/2016 0129   LABSPEC 1.008 06/20/2016 0129   PHURINE 7.0 06/20/2016 0129   GLUCOSEU NEGATIVE 06/20/2016 0129   GLUCOSEU NEGATIVE 06/03/2014 1038   HGBUR MODERATE (A) 06/20/2016 0129   BILIRUBINUR NEGATIVE  06/20/2016 0129   KETONESUR NEGATIVE 06/20/2016 0129   PROTEINUR NEGATIVE 06/20/2016 0129   UROBILINOGEN 0.2 06/03/2014 1038   NITRITE NEGATIVE 06/20/2016 0129   LEUKOCYTESUR TRACE (A) 06/20/2016 0129   Sepsis Labs: _0 (procalcitonin:4,lacticidven:4)    Recent Results (from the past 240 hour(s))  MRSA PCR Screening     Status: None   Collection Time: 07/21/16  9:06 AM  Result Value Ref Range Status   MRSA by PCR NEGATIVE NEGATIVE Final      Radiology Studies: Ir Fluoro Guide Cv Line Right Result Date: 07/20/2016 Successful placement of 19 cm tip to cuff tunneled hemodialysis catheter via the right internal jugular vein with tips terminating within  the superior aspect of the right atrium. The catheter is ready for immediate use.  Ir US Guide Vasc Access Right Result Date: 07/20/2016 Successful placement of 19 cm tip to cuff tunneled hemodialysis catheter via the right internal jugular vein with tips terminating within the superior aspect of the right atrium. The catheter is ready for immediate use.  Ct Maxillofacial Wo Contrast Result Date: 07/20/2016 Normally aerated paranasal sinuses and patent sinus drainage pathways.     Scheduled Meds: . amLODipine  5 mg Oral Daily  . aspirin EC  81 mg Oral Daily  . atorvastatin  10 mg Oral q1800  . feeding supplement (NEPRO CARB STEADY)  237 mL Oral BID BM  . fluticasone  2 spray Each Nare QHS  . loratadine  10 mg Oral Daily  . methylPREDNISolone (SOLU-MEDROL) injection  1,000 mg Intravenous Daily   Followed by  . [START ON 07/23/2016] predniSONE  60 mg Oral Q breakfast  . mupirocin ointment   Nasal BID  . pantoprazole  40 mg Oral Daily  . sodium chloride  2 spray Each Nare TID  . sodium chloride flush  3 mL Intravenous Q12H   Continuous Infusions:   LOS: 2 days    Time spent: 25 minutes  Greater than 50% of the time spent on counseling and coordinating the care.   Leisa Lenz, MD [facility] Pager  [phone]  If 7PM-7AM, please contact night-coverage [email or web address] Password [email or web address] 07/22/2016, 9:04 AM

## 2016-07-23 LAB — POCT I-STAT, CHEM 8
BUN: 39 mg/dL — ABNORMAL HIGH (ref 6–20)
BUN: 9 mg/dL (ref 6–20)
CALCIUM ION: 1.05 mmol/L — AB (ref 1.15–1.40)
CALCIUM ION: 1.08 mmol/L — AB (ref 1.15–1.40)
CHLORIDE: 96 mmol/L — AB (ref 101–111)
Chloride: 95 mmol/L — ABNORMAL LOW (ref 101–111)
Creatinine, Ser: 6 mg/dL — ABNORMAL HIGH (ref 0.44–1.00)
Creatinine, Ser: 1.6 mg/dL — ABNORMAL HIGH (ref 0.44–1.00)
GLUCOSE: 180 mg/dL — AB (ref 65–99)
Glucose, Bld: 122 mg/dL — ABNORMAL HIGH (ref 65–99)
HCT: 31 % — ABNORMAL LOW (ref 36.0–46.0)
HCT: 24 % — ABNORMAL LOW (ref 36.0–46.0)
HEMOGLOBIN: 10.5 g/dL — AB (ref 12.0–15.0)
Hemoglobin: 8.2 g/dL — ABNORMAL LOW (ref 12.0–15.0)
Potassium: 3.7 mmol/L (ref 3.5–5.1)
Potassium: 3.8 mmol/L (ref 3.5–5.1)
SODIUM: 133 mmol/L — AB (ref 135–145)
SODIUM: 134 mmol/L — AB (ref 135–145)
TCO2: 25 mmol/L (ref 0–100)
TCO2: 26 mmol/L (ref 0–100)

## 2016-07-23 LAB — CBC
HCT: 25.7 % — ABNORMAL LOW (ref 36.0–46.0)
HEMOGLOBIN: 8.4 g/dL — AB (ref 12.0–15.0)
MCH: 28 pg (ref 26.0–34.0)
MCHC: 32.7 g/dL (ref 30.0–36.0)
MCV: 85.7 fL (ref 78.0–100.0)
Platelets: 217 10*3/uL (ref 150–400)
RBC: 3 MIL/uL — ABNORMAL LOW (ref 3.87–5.11)
RDW: 16.4 % — ABNORMAL HIGH (ref 11.5–15.5)
WBC: 7.8 10*3/uL (ref 4.0–10.5)

## 2016-07-23 LAB — BASIC METABOLIC PANEL
Anion gap: 15 (ref 5–15)
BUN: 41 mg/dL — AB (ref 6–20)
CALCIUM: 8.5 mg/dL — AB (ref 8.9–10.3)
CHLORIDE: 99 mmol/L — AB (ref 101–111)
CO2: 23 mmol/L (ref 22–32)
Creatinine, Ser: 5.37 mg/dL — ABNORMAL HIGH (ref 0.44–1.00)
GFR calc Af Amer: 8 mL/min — ABNORMAL LOW (ref >60)
GFR, EST NON AFRICAN AMERICAN: 7 mL/min — AB (ref >60)
GLUCOSE: 172 mg/dL — AB (ref 65–99)
Potassium: 3.9 mmol/L (ref 3.5–5.1)
SODIUM: 137 mmol/L (ref 135–145)

## 2016-07-23 MED ORDER — SODIUM CHLORIDE 0.9 % IV SOLN
INTRAVENOUS | Status: AC
  Administered 2016-07-23 (×3): via INTRAVENOUS_CENTRAL
  Filled 2016-07-23 (×3): qty 200

## 2016-07-23 MED ORDER — DIPHENHYDRAMINE HCL 25 MG PO CAPS: 25.0000 mg | ORAL_CAPSULE | Freq: Four times a day (QID) | ORAL | Status: DC | PRN

## 2016-07-23 MED ORDER — ACETAMINOPHEN 325 MG PO TABS
650.0000 mg | ORAL_TABLET | ORAL | Status: DC | PRN
  Administered 2016-07-23: 650 mg via ORAL
  Filled 2016-07-23: qty 2

## 2016-07-23 MED ORDER — CALCIUM CARBONATE ANTACID 500 MG PO CHEW
CHEWABLE_TABLET | ORAL | Status: AC
  Administered 2016-07-23: 400 mg via ORAL
  Filled 2016-07-23: qty 1

## 2016-07-23 MED ORDER — CALCIUM CARBONATE ANTACID 500 MG PO CHEW
2.0000 | CHEWABLE_TABLET | ORAL | Status: AC
  Administered 2016-07-23 (×2): 400 mg via ORAL
  Filled 2016-07-23: qty 2

## 2016-07-23 MED ORDER — DARBEPOETIN ALFA 60 MCG/0.3ML IJ SOSY
PREFILLED_SYRINGE | INTRAMUSCULAR | Status: AC
  Filled 2016-07-23: qty 0.3

## 2016-07-23 MED ORDER — LORAZEPAM 0.5 MG PO TABS
0.5000 mg | ORAL_TABLET | Freq: Once | ORAL | Status: AC
  Administered 2016-07-23: 0.5 mg via ORAL
  Filled 2016-07-23: qty 1

## 2016-07-23 MED ORDER — ACD FORMULA A 0.73-2.45-2.2 GM/100ML VI SOLN
Status: AC
  Administered 2016-07-23: 14:00:00
  Filled 2016-07-23: qty 500

## 2016-07-23 MED ORDER — HEPARIN SODIUM (PORCINE) 1000 UNIT/ML IJ SOLN: 1000.0000 [IU] | Freq: Once | INTRAMUSCULAR | Status: DC

## 2016-07-23 MED ORDER — ACD FORMULA A 0.73-2.45-2.2 GM/100ML VI SOLN
500.0000 mL | Status: DC
  Filled 2016-07-23: qty 500

## 2016-07-23 MED ORDER — SODIUM CHLORIDE 0.9 % IV SOLN
4.0000 g | Freq: Once | INTRAVENOUS | Status: AC
  Administered 2016-07-23: 4 g via INTRAVENOUS
  Filled 2016-07-23: qty 40

## 2016-07-23 NOTE — Progress Notes (Signed)
Patient ID: [name], female   DOB: [birth year], 71 y.o.   MRN: [id number] [facility] Progress Note   Assessment/ Plan:   1. 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 She has anti-GBM positive status as well as positive antimyeloperoxidase antibody (ANCA) for which she is getting high-dose Solu-Medrol and alternate day plasmapheresis. After kidney biopsy results available, will start her on cyclophosphamide. Unfortunately, urine output remains meager and she does not have any signs of renal recovery yet. Continue intermittent hemodialysis. 2. Hypertension:  blood pressures improved with resumption of antihypertensive therapy/dialysis. 3. Anemia: Likely anemia of chronic disease with recent hospitalizations/vasculitis. She has low iron saturation with prohibitively high ferritin for intravenous iron bolusing. Now on Aranesp with hemodialysis. 4. Hyperphosphatemia: Begin low phosphorus diet and monitor with hemodialysis. PTH level elevated, continue to monitor.  Subjective:   Underwent her first plasmapheresis yesterday that she tolerated without problems. She denies any chest pain and states the sensation of having a mucous plug in the back of her throat and continues to experience dysgeusia.    Objective:   BP (!) 125/52 (BP Location: Right Arm)   Pulse 66   Temp 97.2 F (36.2 C) (Oral)   Resp 20   Ht 5\' 3"  (1.6 m)   Wt 60.1 kg (132 lb 7.9 oz)   SpO2 97%   BMI 23.47 kg/m   Intake/Output Summary (Last 24 hours) at 07/23/16 1540 Last data filed at 07/23/16 0716  Gross per 24 hour  Intake              993 ml  Output                0 ml  Net              993 ml   Weight change: -3.55 kg (-7 lb 13.2 oz)  Physical Exam: Gen: Comfortably resting in bed CVS: Pulse regular rhythm, normal S1 and S2 Resp: Clear to  auscultation, no rales/rhonchi Abd: Soft, flat, nontender Ext: No lower extremity edema  Imaging: US Biopsy  Result Date: 07/21/2016 INDICATION: Acute renal failure EXAM: ULTRASOUND-GUIDED BIOPSY OF THE RENAL CORTEX.  RANDOM CORE. MEDICATIONS: None. ANESTHESIA/SEDATION: Fentanyl 50 mcg IV; Versed 1 mg IV Moderate Sedation Time:  10 The patient was continuously monitored during the procedure by the interventional radiology nurse under my direct supervision. FLUOROSCOPY TIME:  None. COMPLICATIONS: None immediate. PROCEDURE: Informed written consent was obtained from the patient after a thorough discussion of the procedural risks, benefits and alternatives. All questions were addressed. Maximal Sterile Barrier Technique was utilized including caps, mask, sterile gowns, sterile gloves, sterile drape, hand hygiene and skin antiseptic. A timeout was performed prior to the initiation of the procedure. The back was prepped with ChloraPrep in a sterile fashion, and a sterile drape was applied covering the operative field. A sterile gown and sterile gloves were used for the procedure. Under sonographic guidance, 2 16 gauge core biopsies of the cortex of the lower pole of the left kidney were obtained. Final imaging was performed. Patient tolerated the procedure well without complication. Vital sign monitoring by nursing staff during the procedure will continue as patient is in the special procedures unit for post procedure observation. FINDINGS: The images document guide needle placement within the left lower pole renal cortex. Post biopsy images demonstrate no evidence of hemorrhage. IMPRESSION: Successful ultrasound-guided random renal cortex  core biopsy. Electronically Signed   By: Marybelle Killings M.D.   On: 07/21/2016 14:36    Labs: BMET  Recent Labs Lab 07/20/16 1250 07/21/16 0543 07/22/16 0739 07/23/16 0129 07/23/16 0605  NA 135 133* 136 133* 137  K 3.8 4.7 4.0 3.7 3.9  CL 93* 96* 95* 96* 99*  CO2 23  20* 25  --  23  GLUCOSE 83 155* 164* 180* 172*  BUN 84* 49* 28* 39* 41*  CREATININE 13.91* 9.17* 4.89* 6.00* 5.37*  CALCIUM 8.2* 7.8* 8.2*  --  8.5*   CBC  Recent Labs Lab 07/20/16 1250 07/21/16 0543 07/22/16 0739 07/23/16 0129 07/23/16 0605  WBC 8.6 8.4 8.6  --  7.8  NEUTROABS 6.4  --   --   --   --   HGB 9.3* 9.0* 8.0* 10.5* 8.4*  HCT 28.1* 27.3* 24.6* 31.0* 25.7*  MCV 84.1 84.3 84.8  --  85.7  PLT 283 227 239  --  217   Medications:    . amLODipine  5 mg Oral Daily  . aspirin EC  81 mg Oral Daily  . atorvastatin  10 mg Oral q1800  . calcium gluconate IVPB  4 g Intravenous Once  . citrate dextrose      . darbepoetin (ARANESP) injection - DIALYSIS  60 mcg Intravenous Q Sat-HD  . feeding supplement (NEPRO CARB STEADY)  237 mL Oral BID BM  . fluticasone  2 spray Each Nare QHS  . heparin  1,000 Units Intracatheter Once  . loratadine  10 mg Oral Daily  . mupirocin ointment   Nasal BID  . pantoprazole  40 mg Oral Daily  . predniSONE  60 mg Oral Q breakfast  . sodium chloride  2 spray Each Nare TID  . sodium chloride flush  3 mL Intravenous Q12H   Elmarie Shiley, MD 07/23/2016, 9:39 AM

## 2016-07-23 NOTE — Progress Notes (Signed)
Patient ID: [name], female   DOB: 08-12-1945, 71 y.o.   MRN: [id number]  PROGRESS NOTE    [place] ZIPP  PTW:[id number] DOB: [birth year] DOA: 07/20/2016  PCP: Merrilee Seashore, MD   Brief Narrative:  71 y.o. female with medical history significant for COPD, esophageal stricture, GERD, hiatal hernia, hyperlipidemia, hypertension, IBS, cervical disc disease and history of lung nodules with extensive workup including lung biopsy ruling out Sjogren's disease. Pt has had recurrent history of sinusitis since last fall, treated with several rounds of antibiotics and corticosteroids which have not resulted in significant clinical improvement. She was hospitalized 2 months ago for fever/weight loss and relative hypotension with anorexia and night sweats and was treated for community-acquired pneumonia. She has continued to have sinus drainage with some epistaxis and bloody secretions and also had nausea with dysgeusia for the past few weeks or so. Pt also reported severe muscle pain and leg cramps over the same time period. Patient's creatinine in January of 2018 was 1.7 and in February 2018 it ranged between 1.6 and 2.1 with a peak of 2.8 that prompted referral to nephrology. After evaluation 07/20/16, she was found to have an elevated creatinine level of 12.4 and given the high risk of suspicion for vasculitis pt was directly admitted to hospital for evaluation.   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 She started her first plasma pheresis 3/16.  - Hep B negative - ESR and CRP elevated   Essential hypertension - Continue Norvasc   Anemia of chronic renal disease - Ferritin 1163, iron 25, TIBC 133 - consistent with anemia of chronic disease  Hyperphosphatemia - Low phosphorus diet, monitor with hemodialysis - PTH level 109  Dyslipidemia - Continue Lipitor    DVT prophylaxis: SCD's bilaterally  Code Status: full code  Family Communication: no family at the bedside this am Disposition Plan: home once cleared by renal    Consultants:   Nephrology  IR  Procedures:   RIJ cathter for HD placed 07/20/2016  Renal biopsy 07/21/2016  Plasmapheresis 3/16  Antimicrobials:   None    Subjective: No overnight events.   Objective: Vitals:   07/23/16 0215 07/23/16 0240 07/23/16 0515 07/23/16 0916  BP: 132/65 (!) 109/54 (!) 114/56 (!) 125/52  Pulse:    66  Resp: _0 Temp: 97.2 F (36.2 C) 97.4 F (36.3 C) 97.6 F (36.4 C) 97.2 F (36.2 C)  TempSrc: Oral Oral Oral Oral  SpO2:   96% 97%  Weight:      Height:        Intake/Output Summary (Last 24 hours) at 07/23/16 1139 Last data filed at 07/23/16 1000  Gross per 24 hour  Intake             1060 ml  Output                0 ml  Net  1060 ml   Filed Weights   07/21/16 2144 07/22/16 2139 07/23/16 0100  Weight: 61 kg (134 lb 8 oz) 58.7 kg (129 lb 4.8 oz) 60.1 kg (132 lb 7.9 oz)    Examination:  General exam: no distress  Respiratory system: No wheezing, no rhonchi  Cardiovascular system: S1 & S2 heard, Rate controlled; has right IJ tunneled cathter placed Gastrointestinal system: (+) BS, non tender, non distended  Central nervous system: Nonfocal Extremities: No edema, palpable pulses bilaterally  Skin: warm and dry, no ulcers  Psychiatry:Normal mood and behavior   Data Reviewed: I have personally reviewed  following labs and imaging studies  CBC:  Recent Labs Lab 07/20/16 1250 07/21/16 0543 07/22/16 0739 07/23/16 0129 07/23/16 0605  WBC 8.6 8.4 8.6  --  7.8  NEUTROABS 6.4  --   --   --   --   HGB 9.3* 9.0* 8.0* 10.5* 8.4*  HCT 28.1* 27.3* 24.6* 31.0* 25.7*  MCV 84.1 84.3 84.8  --  85.7  PLT 283 227 239  --  354   Basic Metabolic Panel:  Recent Labs Lab 07/20/16 1250 07/21/16 0543 07/22/16 0739 07/23/16 0129 07/23/16 0605  NA 135 133* 136 133* 137  K 3.8 4.7 4.0 3.7 3.9  CL 93* 96* 95* 96* 99*  CO2 23 20* 25  --  23  GLUCOSE 83 155* 164* 180* 172*  BUN 84* 49* 28* 39* 41*  CREATININE 13.91* 9.17* 4.89* 6.00* 5.37*  CALCIUM 8.2* 7.8* 8.2*  --  8.5*   GFR: Estimated Creatinine Clearance: 7.8 mL/min (A) (by C-G formula based on SCr of 5.37 mg/dL (H)). Liver Function Tests:  Recent Labs Lab 07/20/16 1250 07/21/16 0543  AST 15 14*  ALT 8* 6*  ALKPHOS 88 80  BILITOT 1.1 1.2  PROT 6.5 5.9*  ALBUMIN 2.0* 1.7*   No results for input(s): LIPASE, AMYLASE in the last 168 hours. No results for input(s): AMMONIA in the last 168 hours. Coagulation Profile:  Recent Labs Lab 07/20/16 1250  INR 1.36   Cardiac Enzymes: No results for input(s): CKTOTAL, CKMB, CKMBINDEX, TROPONINI in the last 168 hours. BNP (last 3 results) No results for input(s): PROBNP in the last 8760 hours. HbA1C: No results for input(s): HGBA1C in the last 72 hours. CBG:  Recent Labs Lab 07/20/16 0916  GLUCAP 91   Lipid Profile: No results for input(s): CHOL, HDL, LDLCALC, TRIG, CHOLHDL, LDLDIRECT in the last 72 hours. Thyroid Function Tests: No results for input(s): TSH, T4TOTAL, FREET4, T3FREE, THYROIDAB in the last 72 hours. Anemia Panel:  Recent Labs  07/20/16 1838  FERRITIN 1,163*  TIBC 133*  IRON 25*   Urine analysis:    Component Value Date/Time   COLORURINE STRAW (A) 06/20/2016 0129   APPEARANCEUR CLEAR 06/20/2016 0129   LABSPEC 1.008 06/20/2016 0129   PHURINE 7.0  06/20/2016 0129   GLUCOSEU NEGATIVE 06/20/2016 0129   GLUCOSEU NEGATIVE 06/03/2014 1038   HGBUR MODERATE (A) 06/20/2016 0129   BILIRUBINUR NEGATIVE 06/20/2016 0129   KETONESUR NEGATIVE 06/20/2016 0129   PROTEINUR NEGATIVE 06/20/2016 0129   UROBILINOGEN 0.2 06/03/2014 1038   NITRITE NEGATIVE 06/20/2016 0129   LEUKOCYTESUR TRACE (A) 06/20/2016 0129   Sepsis Labs: _0 (procalcitonin:4,lacticidven:4)    Recent Results (from the past 240 hour(s))  MRSA PCR Screening     Status: None   Collection Time: 07/21/16  9:06 AM  Result Value Ref Range Status   MRSA by PCR NEGATIVE NEGATIVE Final      Radiology Studies:  Ir Fluoro Guide Cv Line Right Result Date: 07/20/2016 Successful placement of 19 cm tip to cuff tunneled hemodialysis catheter via the right internal jugular vein with tips terminating within the superior aspect of the right atrium. The catheter is ready for immediate use.  Ir US Guide Vasc Access Right Result Date: 07/20/2016 Successful placement of 19 cm tip to cuff tunneled hemodialysis catheter via the right internal jugular vein with tips terminating within the superior aspect of the right atrium. The catheter is ready for immediate use.  Ct Maxillofacial Wo Contrast Result Date: 07/20/2016 Normally aerated paranasal sinuses and patent sinus drainage pathways.     Scheduled Meds: . amLODipine  5 mg Oral Daily  . aspirin EC  81 mg Oral Daily  . atorvastatin  10 mg Oral q1800  . calcium gluconate IVPB  4 g Intravenous Once  . darbepoetin (ARANESP) injection - DIALYSIS  60 mcg Intravenous Q Sat-HD  . feeding supplement (NEPRO CARB STEADY)  237 mL Oral BID BM  . fluticasone  2 spray Each Nare QHS  . heparin  1,000 Units Intracatheter Once  . loratadine  10 mg Oral Daily  . pantoprazole  40 mg Oral Daily  . predniSONE  60 mg Oral Q breakfast  . sodium chloride  2 spray Each Nare TID  . sodium chloride flush  3 mL Intravenous Q12H   Continuous Infusions: .  citrate dextrose       LOS: 3 days    Time spent: 15 minutes  Greater than 50% of the time spent on counseling and coordinating the care.   Leisa Lenz, MD [facility] Pager [phone]  If 7PM-7AM, please contact night-coverage [email or web address] Password [facility] 07/23/2016, 11:39 AM

## 2016-07-23 NOTE — Procedures (Signed)
Patient seen on Hemodialysis. QB 400, UF goal 0.5L (keeping even) Treatment adjusted as needed.  Elmarie Shiley MD [facility]. Office # [phone] Pager # [phone] 2:19 PM

## 2016-07-24 NOTE — Progress Notes (Signed)
Patient ID: [name], female   DOB: Dec 12, 1945, 71 y.o.   MRN: [id number]  PROGRESS NOTE    [name]  UDJ:[id number] DOB: 02/25/1946 DOA: 07/20/2016  PCP: Merrilee Seashore, MD   Brief Narrative:  71 y.o. female with medical history significant for COPD, esophageal stricture, GERD, hiatal hernia, hyperlipidemia, hypertension, IBS, cervical disc disease and history of lung nodules with extensive workup including lung biopsy ruling out Sjogren's disease. Pt has had recurrent history of sinusitis since last fall, treated with several rounds of antibiotics and corticosteroids which have not resulted in significant clinical improvement. She was hospitalized 2 months ago for fever/weight loss and relative hypotension with anorexia and night sweats and was treated for community-acquired pneumonia. She has continued to have sinus drainage with some epistaxis and bloody secretions and also had nausea with dysgeusia for the past few weeks or so. Pt also reported severe muscle pain and leg cramps over the same time period. Patient's creatinine in January of 2018 was 1.7 and in February 2018 it ranged between 1.6 and 2.1 with a peak of 2.8 that prompted referral to nephrology. After evaluation 07/20/16, she was found to have an elevated creatinine level of 12.4 and given the high risk of suspicion for vasculitis pt was directly admitted to hospital for evaluation.   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 She started her first plasmapheresis 3/16.  - Hep B negative - ESR and CRP elevated   Essential hypertension - Continue Norvasc   Anemia of chronic renal disease - Ferritin 1163, iron 25, TIBC 133 - consistent with anemia of chronic disease  Hyperphosphatemia - Low phosphorus diet, monitor with hemodialysis - PTH level 109  Dyslipidemia - Continue Lipitor    DVT prophylaxis: SCD's bilaterally  Code Status: full code  Family Communication: no family at the bedside this am Disposition Plan: home once cleared by renal    Consultants:   Nephrology  IR  Procedures:   RIJ cathter for HD placed 07/20/2016  Renal biopsy 07/21/2016  Plasmapheresis 3/16  Antimicrobials:   None    Subjective: No overnight events.   Objective: Vitals:   07/24/16 0553 07/24/16 0606 07/24/16 0634 07/24/16 0910  BP: (!) 136/54 (!) 134/59 (!) 112/91 120/61  Pulse: 86 60 63 70  Resp: '18 20 19 20  ' Temp: 98.8 F (37.1 C) 98.1 F (36.7 C)  97.8 F (36.6 C)  TempSrc: Oral Oral  Oral  SpO2: 98% 100% 100% 100%  Weight:      Height:        Intake/Output Summary (Last 24 hours) at 07/24/16 1429 Last data filed at 07/24/16 0850  Gross per 24 hour  Intake              360 ml  Output                0 ml  Net  360 ml   Filed Weights   07/23/16 1103 07/23/16 1645 07/23/16 2101  Weight: 59.6 kg (131 lb 6.3 oz) 59.6 kg (131 lb 6.3 oz) 60.2 kg (132 lb 12.8 oz)    Examination:  General exam: no distress, calm and comfortable  Respiratory system: No wheezing, no rhonchi, bilateral air entry  Cardiovascular system: S1 & S2 heard, RRR; has right IJ tunneled cathter placed Gastrointestinal system: (+) BS, no distention  Central nervous system: No focal deficits  Extremities: palpable pulses, no swelling  Skin: warm and dry  Psychiatry: No agitation or restlessness   Data Reviewed: I have personally reviewed following  labs and imaging studies  CBC:  Recent Labs Lab 07/20/16 1250 07/21/16 0543 07/22/16 0739 07/23/16 0129 07/23/16 0605 07/23/16 1529  WBC 8.6 8.4 8.6  --  7.8  --   NEUTROABS 6.4  --   --   --   --   --   HGB 9.3* 9.0* 8.0* 10.5* 8.4* 8.2*  HCT 28.1* 27.3* 24.6* 31.0* 25.7* 24.0*  MCV 84.1 84.3 84.8  --  85.7  --   PLT 283 227 239  --  217  --    Basic Metabolic Panel:  Recent Labs Lab 07/20/16 1250 07/21/16 0543 07/22/16 0739 07/23/16 0129 07/23/16 0605 07/23/16 1529  NA 135 133* 136 133* 137 134*  K 3.8 4.7 4.0 3.7 3.9 3.8  CL 93* 96* 95* 96* 99* 95*  CO2 23 20* 25  --  23  --   GLUCOSE 83 155* 164* 180* 172* 122*  BUN 84* 49* 28* 39* 41* 9  CREATININE 13.91* 9.17* 4.89* 6.00* 5.37* 1.60*  CALCIUM 8.2* 7.8* 8.2*  --  8.5*  --    GFR: Estimated Creatinine Clearance: 26.3 mL/min (A) (by C-G formula based on SCr of 1.6 mg/dL (H)). Liver Function Tests:  Recent Labs Lab 07/20/16 1250 07/21/16 0543  AST 15 14*  ALT 8* 6*  ALKPHOS 88 80  BILITOT 1.1 1.2  PROT 6.5 5.9*  ALBUMIN 2.0* 1.7*   No results for input(s): LIPASE, AMYLASE in the last 168 hours. No results for input(s): AMMONIA in the last 168 hours. Coagulation Profile:  Recent Labs Lab 07/20/16 1250  INR 1.36   Cardiac Enzymes: No results for input(s): CKTOTAL, CKMB, CKMBINDEX, TROPONINI in the last 168 hours. BNP (last 3 results) No results for input(s): PROBNP in the last 8760 hours. HbA1C: No results for input(s): HGBA1C in the last 72 hours. CBG:  Recent Labs Lab 07/20/16 0916  GLUCAP 91   Lipid Profile: No results for input(s): CHOL, HDL, LDLCALC, TRIG, CHOLHDL, LDLDIRECT in the last 72 hours. Thyroid Function Tests: No results for input(s): TSH, T4TOTAL, FREET4, T3FREE, THYROIDAB in the last 72 hours. Anemia Panel: No results for input(s): VITAMINB12, FOLATE, FERRITIN, TIBC, IRON, RETICCTPCT in the last 72 hours. Urine analysis:    Component Value Date/Time   COLORURINE  STRAW (A) 06/20/2016 0129   APPEARANCEUR CLEAR 06/20/2016 0129   LABSPEC 1.008 06/20/2016 0129   PHURINE 7.0 06/20/2016 0129   GLUCOSEU NEGATIVE 06/20/2016 0129   GLUCOSEU NEGATIVE 06/03/2014 1038   HGBUR MODERATE (A) 06/20/2016 0129   BILIRUBINUR NEGATIVE 06/20/2016 0129   KETONESUR NEGATIVE 06/20/2016 0129   PROTEINUR NEGATIVE 06/20/2016 0129   UROBILINOGEN 0.2 06/03/2014 1038   NITRITE NEGATIVE 06/20/2016 0129   LEUKOCYTESUR TRACE (A) 06/20/2016 0129   Sepsis Labs: '@[email or web address]' (procalcitonin:4,lacticidven:4)    Recent Results (from the past 240 hour(s))  MRSA PCR Screening  Status: None   Collection Time: 07/21/16  9:06 AM  Result Value Ref Range Status   MRSA by PCR NEGATIVE NEGATIVE Final      Radiology Studies: Ir Fluoro Guide Cv Line Right Result Date: 07/20/2016 Successful placement of 19 cm tip to cuff tunneled hemodialysis catheter via the right internal jugular vein with tips terminating within the superior aspect of the right atrium. The catheter is ready for immediate use.  Ir US Guide Vasc Access Right Result Date: 07/20/2016 Successful placement of 19 cm tip to cuff tunneled hemodialysis catheter via the right internal jugular vein with tips terminating within the superior aspect of the right atrium. The catheter is ready for immediate use.  Ct Maxillofacial Wo Contrast Result Date: 07/20/2016 Normally aerated paranasal sinuses and patent sinus drainage pathways.     Scheduled Meds: . amLODipine  5 mg Oral Daily  . aspirin EC  81 mg Oral Daily  . atorvastatin  10 mg Oral q1800  . calcium gluconate IVPB  4 g Intravenous Once  . darbepoetin (ARANESP) injection - DIALYSIS  60 mcg Intravenous Q Sat-HD  . feeding supplement (NEPRO CARB STEADY)  237 mL Oral BID BM  . fluticasone  2 spray Each Nare QHS  . heparin  1,000 Units Intracatheter Once  . heparin  1,000 Units Intracatheter Once  . loratadine  10 mg Oral Daily  . pantoprazole  40 mg Oral Daily  .  predniSONE  60 mg Oral Q breakfast  . sodium chloride  2 spray Each Nare TID  . sodium chloride flush  3 mL Intravenous Q12H   Continuous Infusions: . citrate dextrose    . citrate dextrose       LOS: 4 days    Time spent: 15 minutes  Greater than 50% of the time spent on counseling and coordinating the care.   Leisa Lenz, MD [facility] Pager [phone]  If 7PM-7AM, please contact night-coverage [email or web address] Password [facility] 07/24/2016, 2:29 PM

## 2016-07-24 NOTE — Progress Notes (Addendum)
Patient called up to nurses station saying that she fell. Darvin Neighbours and I went to room and found patient sitting on floor next to bed. Patient with no visible signs of injury. Patient stated she bumped her head lightly on the leg of the chair. No injury felt. Assisted patient back to bed. [place] notified. No orders given. Family not called per patient request. Will notify "when they come to visit."

## 2016-07-24 NOTE — Progress Notes (Signed)
Patient ID: [name], female   DOB: [birth year], 71 y.o.   MRN: [id number] [facility] Progress Note   Assessment/ Plan:   1. 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 Started last week on alternate day plasmapheresis with intended duration of 2 weeks. After kidney biopsy results available, will start her on cyclophosphamide. At this time, she is anuric and without any signs of renal recovery-continue dialysis on a TTS schedule. 2. Hypertension:  blood pressures improved with resumption of antihypertensive therapy/dialysis. 3. Anemia: Likely anemia of chronic disease with recent hospitalizations/vasculitis. She has low iron saturation with prohibitively high ferritin for intravenous iron bolusing. Now on Aranesp with hemodialysis. 4. Hyperphosphatemia: Begin low phosphorus diet and monitor with hemodialysis. PTH level elevated, continue to monitor.  Subjective:   She accidentally fell out of bed while trying to remove waterproof pad from under her. She reports improvement of dysgeusia.   Objective:   BP (!) 112/91 (BP Location: Right Arm)   Pulse 63   Temp 98.1 F (36.7 C) (Oral)   Resp 19   Ht 5\' 3"  (1.6 m)   Wt 60.2 kg (132 lb 12.8 oz)   SpO2 100%   BMI 23.52 kg/m   Intake/Output Summary (Last 24 hours) at 07/24/16 0842 Last data filed at 07/24/16 0700  Gross per 24 hour  Intake              480 ml  Output                0 ml  Net              480 ml   Weight change: 0.95 kg (2 lb 1.5 oz)  Physical Exam: Gen: Comfortably resting in bed CVS: Pulse regular rhythm, normal S1 and S2 Resp: Clear to auscultation, no rales/rhonchi Abd: Soft, flat, nontender Ext: No lower extremity edema  Imaging: No results found.  Labs: BMET  Recent Labs Lab 07/20/16 1250 07/21/16 0543 07/22/16 0739 07/23/16 0129 07/23/16 0605  07/23/16 1529  NA 135 133* 136 133* 137 134*  K 3.8 4.7 4.0 3.7 3.9 3.8  CL 93* 96* 95* 96* 99* 95*  CO2 23 20* 25  --  23  --   GLUCOSE 83 155* 164* 180* 172* 122*  BUN 84* 49* 28* 39* 41* 9  CREATININE 13.91* 9.17* 4.89* 6.00* 5.37* 1.60*  CALCIUM 8.2* 7.8* 8.2*  --  8.5*  --    CBC  Recent Labs Lab 07/20/16 1250 07/21/16 0543 07/22/16 0739 07/23/16 0129 07/23/16 0605 07/23/16 1529  WBC 8.6 8.4 8.6  --  7.8  --   NEUTROABS 6.4  --   --   --   --   --   HGB 9.3* 9.0* 8.0* 10.5* 8.4* 8.2*  HCT 28.1* 27.3* 24.6* 31.0* 25.7* 24.0*  MCV 84.1 84.3 84.8  --  85.7  --   PLT 283 227 239  --  217  --    Medications:    . amLODipine  5 mg Oral Daily  . aspirin EC  81 mg Oral Daily  . atorvastatin  10 mg Oral q1800  . calcium gluconate IVPB  4 g Intravenous Once  . darbepoetin (ARANESP) injection - DIALYSIS  60 mcg Intravenous Q Sat-HD  . feeding supplement (NEPRO CARB STEADY)  237 mL Oral BID BM  . fluticasone  2 spray Each  Nare QHS  . heparin  1,000 Units Intracatheter Once  . heparin  1,000 Units Intracatheter Once  . loratadine  10 mg Oral Daily  . pantoprazole  40 mg Oral Daily  . predniSONE  60 mg Oral Q breakfast  . sodium chloride  2 spray Each Nare TID  . sodium chloride flush  3 mL Intravenous Q12H   Elmarie Shiley, MD 07/24/2016, 8:42 AM

## 2016-07-25 ENCOUNTER — Encounter (HOSPITAL_COMMUNITY): Payer: Self-pay | Admitting: Internal Medicine

## 2016-07-25 DIAGNOSIS — I7782 Antineutrophilic cytoplasmic antibody (ANCA) vasculitis: Secondary | ICD-10-CM

## 2016-07-25 DIAGNOSIS — I776 Arteritis, unspecified: Secondary | ICD-10-CM

## 2016-07-25 DIAGNOSIS — N019 Rapidly progressive nephritic syndrome with unspecified morphologic changes: Secondary | ICD-10-CM | POA: Diagnosis present

## 2016-07-25 LAB — POCT I-STAT, CHEM 8
BUN: 40 mg/dL — ABNORMAL HIGH (ref 6–20)
CREATININE: 5 mg/dL — AB (ref 0.44–1.00)
Calcium, Ion: 1.24 mmol/L (ref 1.15–1.40)
Chloride: 98 mmol/L — ABNORMAL LOW (ref 101–111)
GLUCOSE: 172 mg/dL — AB (ref 65–99)
HEMATOCRIT: 24 % — AB (ref 36.0–46.0)
HEMOGLOBIN: 8.2 g/dL — AB (ref 12.0–15.0)
Potassium: 3.7 mmol/L (ref 3.5–5.1)
Sodium: 134 mmol/L — ABNORMAL LOW (ref 135–145)
TCO2: 23 mmol/L (ref 0–100)

## 2016-07-25 LAB — RENAL FUNCTION PANEL
ALBUMIN: 4.4 g/dL (ref 3.5–5.0)
Anion gap: 10 (ref 5–15)
BUN: 42 mg/dL — AB (ref 6–20)
CALCIUM: 8.5 mg/dL — AB (ref 8.9–10.3)
CHLORIDE: 106 mmol/L (ref 101–111)
CO2: 19 mmol/L — ABNORMAL LOW (ref 22–32)
CREATININE: 4.64 mg/dL — AB (ref 0.44–1.00)
GFR calc Af Amer: 10 mL/min — ABNORMAL LOW (ref >60)
GFR, EST NON AFRICAN AMERICAN: 9 mL/min — AB (ref >60)
Glucose, Bld: 160 mg/dL — ABNORMAL HIGH (ref 65–99)
Phosphorus: 3.7 mg/dL (ref 2.5–4.6)
Potassium: 4.3 mmol/L (ref 3.5–5.1)
Sodium: 135 mmol/L (ref 135–145)

## 2016-07-25 LAB — BASIC METABOLIC PANEL
Anion gap: 13 (ref 5–15)
BUN: 39 mg/dL — ABNORMAL HIGH (ref 6–20)
CALCIUM: 8.8 mg/dL — AB (ref 8.9–10.3)
CHLORIDE: 100 mmol/L — AB (ref 101–111)
CO2: 21 mmol/L — AB (ref 22–32)
CREATININE: 4.61 mg/dL — AB (ref 0.44–1.00)
GFR calc non Af Amer: 9 mL/min — ABNORMAL LOW (ref >60)
GFR, EST AFRICAN AMERICAN: 10 mL/min — AB (ref >60)
GLUCOSE: 157 mg/dL — AB (ref 65–99)
Potassium: 3.8 mmol/L (ref 3.5–5.1)
Sodium: 134 mmol/L — ABNORMAL LOW (ref 135–145)

## 2016-07-25 LAB — CBC
HCT: 24.4 % — ABNORMAL LOW (ref 36.0–46.0)
HEMATOCRIT: 25.4 % — AB (ref 36.0–46.0)
HEMOGLOBIN: 8.5 g/dL — AB (ref 12.0–15.0)
Hemoglobin: 8.2 g/dL — ABNORMAL LOW (ref 12.0–15.0)
MCH: 28.1 pg (ref 26.0–34.0)
MCH: 28.2 pg (ref 26.0–34.0)
MCHC: 33.5 g/dL (ref 30.0–36.0)
MCHC: 33.6 g/dL (ref 30.0–36.0)
MCV: 84.1 fL (ref 78.0–100.0)
MCV: 83.8 fL (ref 78.0–100.0)
PLATELETS: 156 10*3/uL (ref 150–400)
Platelets: 190 10*3/uL (ref 150–400)
RBC: 3.02 MIL/uL — ABNORMAL LOW (ref 3.87–5.11)
RBC: 2.91 MIL/uL — ABNORMAL LOW (ref 3.87–5.11)
RDW: 16.2 % — AB (ref 11.5–15.5)
RDW: 16.3 % — ABNORMAL HIGH (ref 11.5–15.5)
WBC: 9.3 10*3/uL (ref 4.0–10.5)
WBC: 9.7 10*3/uL (ref 4.0–10.5)

## 2016-07-25 MED ORDER — SODIUM CHLORIDE 0.9 % IV SOLN
INTRAVENOUS | Status: AC
  Administered 2016-07-25 (×3): via INTRAVENOUS_CENTRAL
  Filled 2016-07-25 (×3): qty 200

## 2016-07-25 MED ORDER — DIPHENHYDRAMINE HCL 25 MG PO CAPS: 25.0000 mg | ORAL_CAPSULE | Freq: Four times a day (QID) | ORAL | Status: DC | PRN

## 2016-07-25 MED ORDER — HEPARIN SODIUM (PORCINE) 1000 UNIT/ML DIALYSIS: 20.0000 [IU]/kg | INTRAMUSCULAR | Status: DC | PRN

## 2016-07-25 MED ORDER — HEPARIN SODIUM (PORCINE) 1000 UNIT/ML IJ SOLN
1000.0000 [IU] | Freq: Once | INTRAMUSCULAR | Status: AC
  Administered 2016-07-25: 1000 [IU]

## 2016-07-25 MED ORDER — CALCIUM CARBONATE ANTACID 500 MG PO CHEW
2.0000 | CHEWABLE_TABLET | ORAL | Status: AC
  Administered 2016-07-25: 400 mg via ORAL
  Filled 2016-07-25: qty 2

## 2016-07-25 MED ORDER — ACD FORMULA A 0.73-2.45-2.2 GM/100ML VI SOLN
500.0000 mL | Status: DC
  Administered 2016-07-25: 500 mL via INTRAVENOUS
  Filled 2016-07-25: qty 500

## 2016-07-25 MED ORDER — PRO-STAT SUGAR FREE PO LIQD
30.0000 mL | Freq: Two times a day (BID) | ORAL | Status: DC
  Administered 2016-07-25: 30 mL via ORAL
  Filled 2016-07-25 (×6): qty 30

## 2016-07-25 MED ORDER — ACETAMINOPHEN 325 MG PO TABS: 650.0000 mg | ORAL_TABLET | ORAL | Status: DC | PRN

## 2016-07-25 MED ORDER — ACD FORMULA A 0.73-2.45-2.2 GM/100ML VI SOLN
Status: AC
Start: 2016-07-25 — End: 2016-07-25
  Filled 2016-07-25: qty 500

## 2016-07-25 MED ORDER — SODIUM CHLORIDE 0.9 % IV SOLN
4.0000 g | INTRAVENOUS | Status: AC
  Administered 2016-07-25: 4 g via INTRAVENOUS
  Filled 2016-07-25: qty 40

## 2016-07-25 NOTE — Progress Notes (Signed)
Patient ID: [name], female   DOB: 1945/06/21, 71 y.o.   MRN: [id number] [facility] Progress Note   Assessment/ Plan:   1. 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 Started last week on alternate day plasmapheresis with intended duration of 2 weeks- next TPE today. After kidney biopsy results available, will start her on cyclophosphamide if indicated- wonder if she might need it based on her upper resp symptoms. At this time, she is anuric and without any signs of renal recovery and creatinine up from 1.6 to 4.6 without dialysis-may not bode well for renal recovery- continue dialysis on a TTS schedule. 2. Hypertension:  blood pressures improved with resumption of antihypertensive therapy/dialysis. 3. Anemia: Likely anemia of chronic disease with recent hospitalizations/vasculitis. She has low iron saturation with prohibitively high ferritin for intravenous iron bolusing. Now on Aranesp with hemodialysis. 4. Hyperphosphatemia: Begin low phosphorus diet and monitor with hemodialysis. PTH 109, continue to monitor- no vit D.  Subjective:   She looks good- is distressed regarding her future    Objective:   BP (!) 145/60 (BP Location: Right Arm)   Pulse 63   Temp 98.5 F (36.9 C) (Oral)   Resp 16   Ht 5\' 3"  (1.6 m)   Wt 59.9 kg (132 lb 0.9 oz)   SpO2 100%   BMI 23.39 kg/m   Intake/Output Summary (Last 24 hours) at 07/25/16 1302 Last data filed at 07/25/16 0900  Gross per 24 hour  Intake              480 ml  Output               50 ml  Net              430 ml   Weight change: 0.3 kg (10.6 oz)  Physical Exam: Gen: Comfortably resting in bed CVS: Pulse regular rhythm, normal S1 and S2 Resp: Clear to auscultation, no rales/rhonchi Abd: Soft, flat, nontender Ext: No lower extremity edema  Imaging: No results  found.  Labs: BMET  Recent Labs Lab 07/20/16 1250 07/21/16 0543 07/22/16 0739 07/23/16 0129 07/23/16 0605 07/23/16 1529 07/25/16 1003  NA 135 133* 136 133* 137 134* 134*  K 3.8 4.7 4.0 3.7 3.9 3.8 3.8  CL 93* 96* 95* 96* 99* 95* 100*  CO2 23 20* 25  --  23  --  21*  GLUCOSE 83 155* 164* 180* 172* 122* 157*  BUN 84* 49* 28* 39* 41* 9 39*  CREATININE 13.91* 9.17* 4.89* 6.00* 5.37* 1.60* 4.61*  CALCIUM 8.2* 7.8* 8.2*  --  8.5*  --  8.8*   CBC  Recent Labs Lab 07/20/16 1250 07/21/16 0543 07/22/16 0739 07/23/16 0129 07/23/16 0605 07/23/16 1529 07/25/16 1003  WBC 8.6 8.4 8.6  --  7.8  --  9.3  NEUTROABS 6.4  --   --   --   --   --   --   HGB 9.3* 9.0* 8.0* 10.5* 8.4* 8.2* 8.5*  HCT 28.1* 27.3* 24.6* 31.0* 25.7* 24.0* 25.4*  MCV 84.1 84.3 84.8  --  85.7  --  84.1  PLT 283 227 239  --  217  --  190   Medications:    . therapeutic plasma exchange solution   Dialysis Q1 Hr x 3  . amLODipine  5 mg Oral Daily  . aspirin EC  81  mg Oral Daily  . atorvastatin  10 mg Oral q1800  . calcium carbonate  2 tablet Oral Q3H  . calcium gluconate IVPB  4 g Intravenous Once  . darbepoetin (ARANESP) injection - DIALYSIS  60 mcg Intravenous Q Sat-HD  . feeding supplement (NEPRO CARB STEADY)  237 mL Oral BID BM  . fluticasone  2 spray Each Nare QHS  . heparin  1,000 Units Intracatheter Once  . loratadine  10 mg Oral Daily  . pantoprazole  40 mg Oral Daily  . predniSONE  60 mg Oral Q breakfast  . sodium chloride  2 spray Each Nare TID  . sodium chloride flush  3 mL Intravenous Q12H   [name] A  07/25/2016, 1:02 PM

## 2016-07-25 NOTE — Progress Notes (Signed)
Patient ID: [name], female   DOB: 10-21-1945, 71 y.o.   MRN: [id number]  PROGRESS NOTE    [name]  YIF:[id number] DOB: [birth year] DOA: 07/20/2016  PCP: Merrilee Seashore, MD   Brief Narrative:  71 y.o. female with medical history significant for COPD, esophageal stricture, GERD, hiatal hernia, hyperlipidemia, hypertension, IBS, cervical disc disease and history of lung nodules with extensive workup including lung biopsy ruling out Sjogren's disease. Pt has had recurrent history of sinusitis since last fall, treated with several rounds of antibiotics and corticosteroids which have not resulted in significant clinical improvement. She was hospitalized 2 months ago for fever/weight loss and relative hypotension with anorexia and night sweats and was treated for community-acquired pneumonia. She has continued to have sinus drainage with some epistaxis and bloody secretions and also had nausea with dysgeusia for the past few weeks or so. Pt also reported severe muscle pain and leg cramps over the same time period. Patient's creatinine in January of 2018 was 1.7 and in February 2018 it ranged between 1.6 and 2.1 with a peak of 2.8 that prompted referral to nephrology. After evaluation 07/20/16, she was found to have an elevated creatinine level of 12.4 and given the high risk of suspicion for vasculitis pt was directly admitted to hospital for evaluation.   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 She started her first plasmapheresis 3/16 - Hep B negative - ESR and CRP elevated  - Appreciate renal following and their recommendations   Essential hypertension - Continue Norvasc   Anemia of chronic renal disease - Ferritin 1163, iron 25, TIBC 133 - consistent with anemia of chronic disease  Hyperphosphatemia - Low phosphorus diet, monitor with hemodialysis - PTH level 109  Dyslipidemia - Continue Lipitor    DVT prophylaxis: SCD's bilaterally  Code Status: full code  Family Communication: no family at the bedside this am Disposition Plan: home once cleared by renal    Consultants:   Nephrology  IR  Procedures:   RIJ cathter for HD placed 07/20/2016  Renal biopsy 07/21/2016  Plasmapheresis 3/16  Antimicrobials:   None    Subjective: No overnight events.   Objective: Vitals:   07/24/16 0634 07/24/16 0910 07/24/16 2054 07/25/16 0540  BP: (!) 112/91 120/61 (!) 129/59 (!) 145/60  Pulse: 63 70 65 63  Resp: '19 20 18 16  ' Temp:  97.8 F (36.6 C) 98.4 F (36.9 C) 98.5 F (36.9 C)  TempSrc:  Oral Oral Oral  SpO2: 100% 100% 100% 100%  Weight:   59.9 kg (132 lb 0.9 oz)   Height:        Intake/Output Summary (Last 24 hours) at 07/25/16 0928 Last data filed at 07/25/16 0657  Gross per 24 hour  Intake              360 ml  Output               50  ml  Net              310 ml   Filed Weights   07/23/16 1645 07/23/16 2101 07/24/16 2054  Weight: 59.6 kg (131 lb 6.3 oz) 60.2 kg (132 lb 12.8 oz) 59.9 kg (132 lb 0.9 oz)    Examination:  General exam: no distress Respiratory system: No wheezing, no rhonchi Cardiovascular system: S1 & S2 heard, has right IJ tunneled cathter placed Gastrointestinal system: (+) BS, non tender  Central nervous system: Nonfocal  Extremities: palpable pulses bilaterally  Skin: warm and dry, no lesions or ulcers  Psychiatry: No agitation or restlessness, normal  mood   Data Reviewed: I have personally reviewed following labs and imaging studies  CBC:  Recent Labs Lab 07/20/16 1250 07/21/16 0543 07/22/16 0739 07/23/16 0129 07/23/16 0605 07/23/16 1529  WBC 8.6 8.4 8.6  --  7.8  --   NEUTROABS 6.4  --   --   --   --   --   HGB 9.3* 9.0* 8.0* 10.5* 8.4* 8.2*  HCT 28.1* 27.3* 24.6* 31.0* 25.7* 24.0*  MCV 84.1 84.3 84.8  --  85.7  --   PLT 283 227 239  --  217  --    Basic Metabolic Panel:  Recent Labs Lab 07/20/16 1250 07/21/16 0543 07/22/16 0739 07/23/16 0129 07/23/16 0605 07/23/16 1529  NA 135 133* 136 133* 137 134*  K 3.8 4.7 4.0 3.7 3.9 3.8  CL 93* 96* 95* 96* 99* 95*  CO2 23 20* 25  --  23  --   GLUCOSE 83 155* 164* 180* 172* 122*  BUN 84* 49* 28* 39* 41* 9  CREATININE 13.91* 9.17* 4.89* 6.00* 5.37* 1.60*  CALCIUM 8.2* 7.8* 8.2*  --  8.5*  --    GFR: Estimated Creatinine Clearance: 26.3 mL/min (A) (by C-G formula based on SCr of 1.6 mg/dL (H)). Liver Function Tests:  Recent Labs Lab 07/20/16 1250 07/21/16 0543  AST 15 14*  ALT 8* 6*  ALKPHOS 88 80  BILITOT 1.1 1.2  PROT 6.5 5.9*  ALBUMIN 2.0* 1.7*   No results for input(s): LIPASE, AMYLASE in the last 168 hours. No results for input(s): AMMONIA in the last 168 hours. Coagulation Profile:  Recent Labs Lab 07/20/16 1250  INR 1.36   Cardiac Enzymes: No results for input(s): CKTOTAL, CKMB, CKMBINDEX, TROPONINI in the last 168 hours. BNP (last 3 results) No results for input(s): PROBNP in the last 8760 hours. HbA1C: No results for input(s): HGBA1C in the last 72 hours. CBG:  Recent Labs Lab 07/20/16 0916  GLUCAP 91   Lipid Profile: No results for input(s): CHOL, HDL, LDLCALC, TRIG, CHOLHDL, LDLDIRECT in the last 72 hours. Thyroid Function Tests: No results for input(s): TSH, T4TOTAL, FREET4, T3FREE, THYROIDAB in the last 72 hours. Anemia Panel: No results for input(s): VITAMINB12, FOLATE, FERRITIN, TIBC, IRON, RETICCTPCT in the last 72  hours. Urine analysis:    Component Value Date/Time   COLORURINE STRAW (A) 06/20/2016 0129   APPEARANCEUR CLEAR 06/20/2016 0129   LABSPEC 1.008 06/20/2016 0129   PHURINE 7.0 06/20/2016 0129   GLUCOSEU NEGATIVE 06/20/2016 0129   GLUCOSEU NEGATIVE 06/03/2014 1038   HGBUR MODERATE (A) 06/20/2016 0129   BILIRUBINUR NEGATIVE 06/20/2016 0129   KETONESUR NEGATIVE 06/20/2016 0129   PROTEINUR NEGATIVE 06/20/2016 0129   UROBILINOGEN 0.2 06/03/2014 1038   NITRITE NEGATIVE 06/20/2016 0129   LEUKOCYTESUR TRACE (A) 06/20/2016 0129   Sepsis Labs: '@[email or web address]' (procalcitonin:4,lacticidven:4)    Recent Results (from the  past 240 hour(s))  MRSA PCR Screening     Status: None   Collection Time: 07/21/16  9:06 AM  Result Value Ref Range Status   MRSA by PCR NEGATIVE NEGATIVE Final      Radiology Studies: Ir Fluoro Guide Cv Line Right Result Date: 07/20/2016 Successful placement of 19 cm tip to cuff tunneled hemodialysis catheter via the right internal jugular vein with tips terminating within the superior aspect of the right atrium. The catheter is ready for immediate use.  Ir US Guide Vasc Access Right Result Date: 07/20/2016 Successful placement of 19 cm tip to cuff tunneled hemodialysis catheter via the right internal jugular vein with tips terminating within the superior aspect of the right atrium. The catheter is ready for immediate use.  Ct Maxillofacial Wo Contrast Result Date: 07/20/2016 Normally aerated paranasal sinuses and patent sinus drainage pathways.     Scheduled Meds: . amLODipine  5 mg Oral Daily  . aspirin EC  81 mg Oral Daily  . atorvastatin  10 mg Oral q1800  . darbepoetin (ARANESP) injection - DIALYSIS  60 mcg Intravenous Q Sat-HD  . feeding supplement (NEPRO CARB STEADY)  237 mL Oral BID BM  . fluticasone  2 spray Each Nare QHS  . loratadine  10 mg Oral Daily  . pantoprazole  40 mg Oral Daily  . predniSONE  60 mg Oral Q breakfast  . sodium chloride  2 spray Each  Nare TID  . sodium chloride flush  3 mL Intravenous Q12H   Continuous Infusions:    LOS: 5 days    Time spent: 15 minutes  Greater than 50% of the time spent on counseling and coordinating the care.   Leisa Lenz, MD [facility] Pager [phone]  If 7PM-7AM, please contact night-coverage [email or web address] Password [facility] 07/25/2016, 9:28 AM

## 2016-07-25 NOTE — Care Management Important Message (Signed)
Important Message  Patient Details  Name: [name] MRN: [id number] Date of Birth: 05/16/1945   Medicare Important Message Given:  Yes    Orbie Pyo 07/25/2016, 12:20 PM

## 2016-07-25 NOTE — Progress Notes (Signed)
Nutrition Follow-up  DOCUMENTATION CODES:   Not applicable  INTERVENTION:  Provide 30 ml Prostat po BID, each supplement provides 100 kcal and 15 grams of protein.   Discontinue Nepro Shake due to poor acceptance.  Encourage adequate PO intake.   NUTRITION DIAGNOSIS:   Increased nutrient needs related to chronic illness as evidenced by estimated needs; ongoing  GOAL:   Patient will meet greater than or equal to 90% of their needs; progressing  MONITOR:   Supplement acceptance, Diet advancement, Labs, Weight trends, Skin, I & O's  REASON FOR ASSESSMENT:   Malnutrition Screening Tool    ASSESSMENT:   71 y.o. female with medical history significant for COPD, esophageal stricture, GERD, hiatal hernia, hyperlipidemia, hypertension, IBS, cervical disc disease and history of lung nodules with extensive workup including lung biopsy ruling out Sjogren's disease. She was hospitalized 2 months ago for fever/weight loss and relative hypotension with anorexia and night sweats and was treated for community-acquired pneumonia. She has continued to have sinus drainage with some epistaxis and bloody secretions and also had nausea with dysgeusia for the past few weeks or so. 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 Plasmapheresis started 3/16.   Meal completion has been varied from 15-100%. Pt reports appetite is fine and has been improving since admission. Pt currently has Nepro Shake ordered and has been refusing them reporting she dislikes the taste. Pt was offered an alternative such as [employer] however pt refused. RD to order Prostat to aid in adequate protein intake. Pt encouraged to eat her food at meals.   Labs and medications reviewed.   Diet Order:  Diet renal with fluid restriction Fluid restriction: 1200 mL Fluid;  Room service appropriate? Yes; Fluid consistency: Thin  Skin:  Wound (see comment) (wound on back)  Last BM:  3/15  Height:   Ht Readings from Last 1 Encounters:  07/22/16 5\' 3"  (1.6 m)    Weight:   Wt Readings from Last 1 Encounters:  07/25/16 136 lb 0.4 oz (61.7 kg)    Ideal Body Weight:  52.27 kg  BMI:  Body mass index is 24.1 kg/m.  Estimated Nutritional Needs:   Kcal:  1700-1900  Protein:  75-85 grams  Fluid:  Per MD  EDUCATION NEEDS:   Education needs addressed  Corrin Parker, MS, RD, LDN Pager # [phone] After hours/ weekend pager # [phone]

## 2016-07-26 ENCOUNTER — Inpatient Hospital Stay (HOSPITAL_COMMUNITY): Payer: Medicare HMO

## 2016-07-26 DIAGNOSIS — N019 Rapidly progressive nephritic syndrome with unspecified morphologic changes: Secondary | ICD-10-CM

## 2016-07-26 DIAGNOSIS — R918 Other nonspecific abnormal finding of lung field: Secondary | ICD-10-CM

## 2016-07-26 DIAGNOSIS — I776 Arteritis, unspecified: Secondary | ICD-10-CM

## 2016-07-26 LAB — CBC
HCT: 21.4 % — ABNORMAL LOW (ref 36.0–46.0)
HEMOGLOBIN: 7.2 g/dL — AB (ref 12.0–15.0)
MCH: 27.9 pg (ref 26.0–34.0)
MCHC: 33.6 g/dL (ref 30.0–36.0)
MCV: 82.9 fL (ref 78.0–100.0)
Platelets: 139 10*3/uL — ABNORMAL LOW (ref 150–400)
RBC: 2.58 MIL/uL — AB (ref 3.87–5.11)
RDW: 16.2 % — ABNORMAL HIGH (ref 11.5–15.5)
WBC: 7.4 10*3/uL (ref 4.0–10.5)

## 2016-07-26 LAB — RENAL FUNCTION PANEL
Albumin: 3.5 g/dL (ref 3.5–5.0)
Anion gap: 11 (ref 5–15)
BUN: 48 mg/dL — ABNORMAL HIGH (ref 6–20)
CALCIUM: 8.4 mg/dL — AB (ref 8.9–10.3)
CO2: 20 mmol/L — ABNORMAL LOW (ref 22–32)
CREATININE: 5.2 mg/dL — AB (ref 0.44–1.00)
Chloride: 104 mmol/L (ref 101–111)
GFR calc Af Amer: 9 mL/min — ABNORMAL LOW (ref >60)
GFR calc non Af Amer: 7 mL/min — ABNORMAL LOW (ref >60)
Glucose, Bld: 141 mg/dL — ABNORMAL HIGH (ref 65–99)
PHOSPHORUS: 4.1 mg/dL (ref 2.5–4.6)
Potassium: 4.1 mmol/L (ref 3.5–5.1)
Sodium: 135 mmol/L (ref 135–145)

## 2016-07-26 LAB — PREPARE RBC (CROSSMATCH)

## 2016-07-26 MED ORDER — SULFAMETHOXAZOLE-TRIMETHOPRIM 800-160 MG PO TABS
1.0000 | ORAL_TABLET | ORAL | Status: DC
  Administered 2016-07-27 – 2016-08-12 (×8): 1 via ORAL
  Filled 2016-07-26 (×8): qty 1

## 2016-07-26 MED ORDER — SODIUM CHLORIDE 0.9 % IV SOLN: Freq: Once | INTRAVENOUS | Status: DC

## 2016-07-26 MED ORDER — CALCIUM CARBONATE ANTACID 500 MG PO CHEW
CHEWABLE_TABLET | ORAL | Status: AC
  Filled 2016-07-26: qty 1

## 2016-07-26 MED ORDER — CYCLOPHOSPHAMIDE 50 MG PO CAPS
100.0000 mg | ORAL_CAPSULE | Freq: Every day | ORAL | Status: DC
  Administered 2016-07-26 – 2016-08-02 (×8): 100 mg via ORAL
  Filled 2016-07-26 (×8): qty 2

## 2016-07-26 MED ORDER — CALCIUM CARBONATE ANTACID 500 MG PO CHEW
1.0000 | CHEWABLE_TABLET | Freq: Two times a day (BID) | ORAL | Status: DC | PRN
  Administered 2016-07-26: 200 mg via ORAL

## 2016-07-26 NOTE — Progress Notes (Signed)
Patient ID: [name], female   DOB: 10-Dec-1945, 71 y.o.   MRN: [id number]  PROGRESS NOTE    [name]  UKG:[id number] DOB: 1946/03/24 DOA: 07/20/2016  PCP: Merrilee Seashore, MD   Brief Narrative:  71 y.o. female with medical history significant for COPD, esophageal stricture, GERD, hiatal hernia, hyperlipidemia, hypertension, IBS, cervical disc disease and history of lung nodules with extensive workup including lung biopsy ruling out Sjogren's disease. Pt has had recurrent history of sinusitis since last fall, treated with several rounds of antibiotics and corticosteroids which have not resulted in significant clinical improvement. She was hospitalized 2 months ago for fever/weight loss and relative hypotension with anorexia and night sweats and was treated for community-acquired pneumonia. She has continued to have sinus drainage with some epistaxis and bloody secretions and also had nausea with dysgeusia for the past few weeks or so. Pt also reported severe muscle pain and leg cramps over the same time period. Patient's creatinine in January of 2018 was 1.7 and in February 2018 it ranged between 1.6 and 2.1 with a peak of 2.8 that prompted referral to nephrology. After evaluation 07/20/16, she was found to have an elevated creatinine level of 12.4 and given the high risk of suspicion for vasculitis pt was directly admitted to hospital for evaluation.  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 She was on solumedrol and then switched to PO prednisone. She is required to have plasmapheresis on alternate days for 2 weeks (started 07/22/2016).     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[place]) - Per renal suspicious for ANCA associated vasculitis  - Tunneled catheter placed 07/20/2016, she started HD 3/14 for uremic signs and symptoms  - S/P renal biopsy 3/15 - results  pending as of this am 3/20 - Pt has positive myeloperoxidase antibodies and has anti GBM positive status  - Started empirically on solumedrol 07/20/16 for possible ANCA vasculitis (completed 3 days of IV solumedrol and now on PO prednisone) - Started plasmapheresis 3/16, she needs it every other day for 2 weeks and she will begin cyclophosphamide once results of renal biopsy are available - She is HD TTS - Hep B negative - ESR and CRP elevated  - Appreciate renal following and their recommendations   Essential hypertension - Continue Norvasc   Anemia of chronic renal disease - Ferritin 1163, iron 25, TIBC 133 - consistent with anemia of chronic disease  Hyperphosphatemia - Low phosphorus diet, monitor with hemodialysis - PTH level 109  Dyslipidemia - Continue Lipitor    DVT prophylaxis: SCD's bilaterally  Code Status: full code  Family Communication: no family at the bedside this am Disposition Plan: home once cleared by renal    Consultants:   Nephrology  IR  Procedures:   RIJ cathter for HD placed 07/20/2016  Renal biopsy 07/21/2016  Plasmapheresis 3/16  HD TTS  Antimicrobials:   None    Subjective: No overnight events.   Objective: Vitals:   07/26/16 0830 07/26/16 0900 07/26/16 0930 07/26/16 0956  BP: 134/74 131/73 (!) 147/67 118/87  Pulse: 64 64 68 62  Resp:    17  Temp:    97.2 F (36.2 C)  TempSrc:    Oral  SpO2:    100%  Weight:      Height:        Intake/Output Summary (Last 24 hours) at 07/26/16 1010 Last data filed at 07/26/16  0956  Gross per 24 hour  Intake               85 ml  Output                0 ml  Net               85 ml   Filed Weights   07/25/16 1400 07/25/16 2135 07/26/16 0700  Weight: 61.7 kg (136 lb 0.4 oz) 61.7 kg (136 lb 0.4 oz) 60.6 kg (133 lb 9.6 oz)    Examination:  General exam: no distress, calm and comfortable  Respiratory system: No wheezing, no rhonchi, bilateral air entry  Cardiovascular system: S1 & S2  heard, has right IJ tunneled cathter placed Gastrointestinal system: (+) BS, non tender, non distended  Central nervous system: No focal deficits  Extremities: palpable pulses bilaterally, no swelling  Skin: warm and dry, no lesions or ulcers  Psychiatry: No agitation or restlessness, normal mood and behavior   Data Reviewed: I have personally reviewed following labs and imaging studies  CBC:  Recent Labs Lab 07/20/16 1250  07/22/16 0739  07/23/16 0605 07/23/16 1529 07/25/16 1003 07/25/16 1406 07/25/16 1851 07/26/16 0520  WBC 8.6  < > 8.6  --  7.8  --  9.3  --  9.7 7.4  NEUTROABS 6.4  --   --   --   --   --   --   --   --   --   HGB 9.3*  < > 8.0*  < > 8.4* 8.2* 8.5* 8.2* 8.2* 7.2*  HCT 28.1*  < > 24.6*  < > 25.7* 24.0* 25.4* 24.0* 24.4* 21.4*  MCV 84.1  < > 84.8  --  85.7  --  84.1  --  83.8 82.9  PLT 283  < > 239  --  217  --  190  --  156 139*  < > = values in this interval not displayed. Basic Metabolic Panel:  Recent Labs Lab 07/22/16 0739  07/23/16 0605 07/23/16 1529 07/25/16 1003 07/25/16 1406 07/25/16 1851 07/26/16 0520  NA 136  < > 137 134* 134* 134* 135 135  K 4.0  < > 3.9 3.8 3.8 3.7 4.3 4.1  CL 95*  < > 99* 95* 100* 98* 106 104  CO2 25  --  23  --  21*  --  19* 20*  GLUCOSE 164*  < > 172* 122* 157* 172* 160* 141*  BUN 28*  < > 41* 9 39* 40* 42* 48*  CREATININE 4.89*  < > 5.37* 1.60* 4.61* 5.00* 4.64* 5.20*  CALCIUM 8.2*  --  8.5*  --  8.8*  --  8.5* 8.4*  PHOS  --   --   --   --   --   --  3.7 4.1  < > = values in this interval not displayed. GFR: Estimated Creatinine Clearance: 8.1 mL/min (A) (by C-G formula based on SCr of 5.2 mg/dL (H)). Liver Function Tests:  Recent Labs Lab 07/20/16 1250 07/21/16 0543 07/25/16 1851 07/26/16 0520  AST 15 14*  --   --   ALT 8* 6*  --   --   ALKPHOS 88 80  --   --   BILITOT 1.1 1.2  --   --   PROT 6.5 5.9*  --   --   ALBUMIN 2.0* 1.7* 4.4 3.5   No results for input(s): LIPASE, AMYLASE in the last 168  hours. No  results for input(s): AMMONIA in the last 168 hours. Coagulation Profile:  Recent Labs Lab 07/20/16 1250  INR 1.36   Cardiac Enzymes: No results for input(s): CKTOTAL, CKMB, CKMBINDEX, TROPONINI in the last 168 hours. BNP (last 3 results) No results for input(s): PROBNP in the last 8760 hours. HbA1C: No results for input(s): HGBA1C in the last 72 hours. CBG:  Recent Labs Lab 07/20/16 0916  GLUCAP 91   Lipid Profile: No results for input(s): CHOL, HDL, LDLCALC, TRIG, CHOLHDL, LDLDIRECT in the last 72 hours. Thyroid Function Tests: No results for input(s): TSH, T4TOTAL, FREET4, T3FREE, THYROIDAB in the last 72 hours. Anemia Panel: No results for input(s): VITAMINB12, FOLATE, FERRITIN, TIBC, IRON, RETICCTPCT in the last 72 hours. Urine analysis:    Component Value Date/Time   COLORURINE STRAW (A) 06/20/2016 0129   APPEARANCEUR CLEAR 06/20/2016 0129   LABSPEC 1.008 06/20/2016 0129   PHURINE 7.0 06/20/2016 0129   GLUCOSEU NEGATIVE 06/20/2016 0129   GLUCOSEU NEGATIVE 06/03/2014 1038   HGBUR MODERATE (A) 06/20/2016 0129   BILIRUBINUR NEGATIVE 06/20/2016 0129   KETONESUR NEGATIVE 06/20/2016 0129   PROTEINUR NEGATIVE 06/20/2016 0129   UROBILINOGEN 0.2 06/03/2014 1038   NITRITE NEGATIVE 06/20/2016 0129   LEUKOCYTESUR TRACE (A) 06/20/2016 0129   Sepsis Labs: _0 (procalcitonin:4,lacticidven:4)    Recent Results (from the past 240 hour(s))  MRSA PCR Screening     Status: None   Collection Time: 07/21/16  9:06 AM  Result Value Ref Range Status   MRSA by PCR NEGATIVE NEGATIVE Final      Radiology Studies: Ir Fluoro Guide Cv Line Right Result Date: 07/20/2016 Successful placement of 19 cm tip to cuff tunneled hemodialysis catheter via the right internal jugular vein with tips terminating within the superior aspect of the right atrium. The catheter is ready for immediate use.  Ir US Guide Vasc Access Right Result Date: 07/20/2016 Successful placement of  19 cm tip to cuff tunneled hemodialysis catheter via the right internal jugular vein with tips terminating within the superior aspect of the right atrium. The catheter is ready for immediate use.  Ct Maxillofacial Wo Contrast Result Date: 07/20/2016 Normally aerated paranasal sinuses and patent sinus drainage pathways.     Scheduled Meds: . sodium chloride   Intravenous Once  . amLODipine  5 mg Oral Daily  . aspirin EC  81 mg Oral Daily  . atorvastatin  10 mg Oral q1800  . calcium carbonate      . darbepoetin (ARANESP) injection - DIALYSIS  60 mcg Intravenous Q Sat-HD  . feeding supplement (PRO-STAT SUGAR FREE 64)  30 mL Oral BID  . fluticasone  2 spray Each Nare QHS  . loratadine  10 mg Oral Daily  . pantoprazole  40 mg Oral Daily  . predniSONE  60 mg Oral Q breakfast  . sodium chloride  2 spray Each Nare TID  . sodium chloride flush  3 mL Intravenous Q12H   Continuous Infusions:    LOS: 6 days    Time spent: 25 minutes  Greater than 50% of the time spent on counseling and coordinating the care.   Leisa Lenz, MD [facility] Pager [phone]  If 7PM-7AM, please contact night-coverage [email or web address] Password [email or web address] 07/26/2016, 10:10 AM

## 2016-07-26 NOTE — Consult Note (Signed)
Name: [name] MRN: [id number] DOB: 05-04-1946    ADMISSION DATE:  07/20/2016 CONSULTATION DATE:  3/201/18  REFERRING MD :  Dr. Charlies Silvers   CHIEF COMPLAINT:  Acute Renal Failure    HISTORY OF PRESENT ILLNESS:  71 y/o F, former smoker (quit 1986), admitted on 3/14 from Dr. Sanda Klein office with acute renal failure.  She is followed by Dr. Elsworth Soho (since) for abnormal CT imaging & pulmonary nodules.  Work up for this has included an open lung biopsy in 11/2008 in the setting of fever/night sweats + CT scan with pulmonary nodules which showed possible inflammatory pseudotumor vs reaction to old bronchopneumonia associated with some carcinoid tumorlets, saliva gland biospy 03/2009 which was negative for Sjogren's disease and negative autoimmune work up.  Serial CT's were planned but she was lost to follow up.  She had a repeat CT of the chest in 06/2016 which showed volume loss, post surgical changes of the left hemithorax and RLL pulmonary nodules.     The patient was in the process of work up for renal failure as an outpatient.  She was referred to the ER with a serum creatinine of 12 per report (normal sr cr 09/10/15).  She recently was hospitalized in January for PNA.  Also, she has had multiple rounds of sinusitis and treated with corticosteroids + abx.  Issue was first noted on 06/19/16 with a serum creatinine of 2.1 / BUN 18.  She was admitted per [facility] for further evaluation.  Admit labs - sr cr 13.91 / BUN 84, albumin 2, Hgb 7.2 and platelets 139.  CT of the sinuses was assessed on admit which showed normally aerated paranasal sinuses and patent sinus drainage.  She had a tunneled HD catheter placed on admit per IR in the setting of AKI.  Outpatient Nephrology work up was positive for myeloperoxidase antibodies and anti-GBM.   She further underwent a renal biopsy on 3/15 given concerns for AKI and possible ANCA vasculitis.  She was subsequently started on plasmaphoresis and steroids.   Cyclophosphamide initiated on 3/15.  Surgical pathology pending.  Repeat MPO + anti-GBM antibodies pending this admit.  PCCM consulted for evaluation to assist with timing of re-imaging in the setting of abnormal CT.       Pt denies hemoptysis, nose bleeds, cough.  Reports 25-30 lbs weight loss.     PAST MEDICAL HISTORY :   has a past medical history of Acute kidney injury ([place]) (07/20/2016); Allergic rhinitis; Anxiety; CAP (community acquired pneumonia) (06/2016); Cervical disc disease; Chest pain at rest (06/18/2012); COPD (chronic obstructive pulmonary disease) ([place]) (05/23/2012); Diverticulosis; DJD (degenerative joint disease); Esophageal diverticulum; Esophageal stricture; GERD (gastroesophageal reflux disease); Hiatal hernia; History of blood transfusion (06/2016); Hyperlipidemia (05/23/2012); Hypertension; IBS (irritable bowel syndrome); Lung nodules; Osteopenia; Recurrent sinusitis; Sinus headache; and Traumatic arthritis.   has a past surgical history that includes Lung biopsy (Left); Cystectomy (Bilateral); Tonsillectomy and adenoidectomy; Vaginal hysterectomy; Colonoscopy (2001); Back surgery; Anterior cervical decomp/discectomy fusion (2005); ir generic historical (07/20/2016); and ir generic historical (07/20/2016).  Prior to Admission medications   Medication Sig Start Date End Date Taking? Authorizing Provider  amLODipine (NORVASC) 5 MG tablet Take 5 mg by mouth daily.   Yes Historical Provider, MD  aspirin EC 81 MG tablet Take 81 mg by mouth daily.   Yes Historical Provider, MD  atorvastatin (LIPITOR) 10 MG tablet Take 1 tablet (10 mg total) by mouth daily. 10/20/15  Yes Biagio Borg, MD  hydrochlorothiazide (HYDRODIURIL) 12.5 MG tablet  Take 12.5 mg by mouth daily. 05/28/16  Yes Historical Provider, MD  pantoprazole (PROTONIX) 40 MG tablet Take 1 tablet (40 mg total) by mouth daily. 10/20/15  Yes Biagio Borg, MD  sodium chloride (OCEAN) 0.65 % SOLN nasal spray Place 2 sprays into both  nostrils 3 (three) times daily.   Yes Historical Provider, MD  acetaminophen (TYLENOL) 325 MG tablet Take 2 tablets (650 mg total) by mouth every 6 (six) hours as needed for mild pain, moderate pain, fever or headache. Patient not taking: Reported on 07/20/2016 06/23/16   Modena Jansky, MD  amoxicillin-clavulanate (AUGMENTIN) 500-125 MG tablet Take 1 tablet (500 mg total) by mouth 2 (two) times daily. Patient not taking: Reported on 07/20/2016 06/23/16   Modena Jansky, MD  feeding supplement (BOOST / RESOURCE BREEZE) LIQD Take 1 Container by mouth 3 (three) times daily between meals. Patient not taking: Reported on 07/20/2016 06/23/16   Modena Jansky, MD    Allergies  Allergen Reactions  . Ace Inhibitors Anaphylaxis    REACTION: angioedema  . Other Anaphylaxis    Calcium Channel Blocking Agent Diltiazem Analogues  . Pneumococcal Vaccines Other (See Comments)    Red rash, Knot (sore and itches), A fever for a couple days (per pt report)  . Tetanus Toxoid Other (See Comments)    Cellulitis in arm    FAMILY HISTORY:  family history includes Blindness in her sister; Breast cancer in her maternal aunt; Hypertension in her sister and sister; Throat cancer in her father; Thyroid disease in her daughter.  SOCIAL HISTORY:  reports that she quit smoking about 31 years ago. Her smoking use included Cigarettes. She has a 0.96 pack-year smoking history. She has never used smokeless tobacco. She reports that she drinks about 0.6 oz of alcohol per week . She reports that she does not use drugs.  REVIEW OF SYSTEMS:  POSITIVES IN BOLD Constitutional: Negative for fever, chills,  25-30lb weight loss since 02/2016, malaise/fatigue and diaphoresis.  HENT: Negative for hearing loss, ear pain, nosebleeds, congestion, sore throat, neck pain, tinnitus and ear discharge.   Eyes: Negative for blurred vision, double vision, photophobia, pain, discharge and redness.  Respiratory: Negative for cough,  hemoptysis, sputum production, shortness of breath, wheezing and stridor.   Cardiovascular: Negative for chest pain, palpitations, orthopnea, claudication, leg swelling and PND.  Gastrointestinal: Negative for heartburn, nausea, vomiting, abdominal pain, diarrhea, constipation, blood in stool and melena.  Genitourinary: Negative for dysuria, urgency, frequency, hematuria and flank pain.  Musculoskeletal: Negative for myalgias, back pain, joint pain and falls.  Skin: Negative for itching and rash.  Neurological: Negative for dizziness, tingling, tremors, sensory change, speech change, focal weakness, seizures, loss of consciousness, weakness and headaches.  Endo/Heme/Allergies: Negative for environmental allergies and polydipsia. Does not bruise/bleed easily.  SUBJECTIVE:    VITAL SIGNS: Temp:  [97.2 F (36.2 C)-98.9 F (37.2 C)] 98.9 F (37.2 C) (03/20 1143) Pulse Rate:  [61-74] 68 (03/20 1143) Resp:  [12-18] 17 (03/20 1143) BP: (112-159)/(53-94) 155/68 (03/20 1143) SpO2:  [97 %-100 %] 100 % (03/20 1143) Weight:  [133 lb 9.6 oz (60.6 kg)-136 lb 0.4 oz (61.7 kg)] 133 lb 9.6 oz (60.6 kg) (03/20 0700)  PHYSICAL EXAMINATION: General: well developed adult female in NAD, lying in bed HEENT: MM pink/moist, no jvd Neuro: AAOx4, speech clear CV: s1s2 rrr, no m/r/g PULM: even/non-labored, lungs bilaterally clear WN:IOEV, non-tender, bsx4 active  Extremities: warm/dry, no edema  Skin: no rashes or lesions    Recent  Labs Lab 07/25/16 1003 07/25/16 1406 07/25/16 1851 07/26/16 0520  NA 134* 134* 135 135  K 3.8 3.7 4.3 4.1  CL 100* 98* 106 104  CO2 21*  --  19* 20*  BUN 39* 40* 42* 48*  CREATININE 4.61* 5.00* 4.64* 5.20*  GLUCOSE 157* 172* 160* 141*     Recent Labs Lab 07/25/16 1003 07/25/16 1406 07/25/16 1851 07/26/16 0520  HGB 8.5* 8.2* 8.2* 7.2*  HCT 25.4* 24.0* 24.4* 21.4*  WBC 9.3  --  9.7 7.4  PLT 190  --  156 139*    No results found.    SIGNIFICANT EVENTS    3/14  Admit with AKI, tunneled HD cath placed per IR 3/15  Renal US biopsy per IR, solumedrol initiated 3/16  Plasmapheresis initiated 3/20  Cyclophosphamide initiated   STUDIES:  3/14  CT Sinuses >> normally aerated paranasal sinuses and patent sinus drainage 3/15  Renal US Biopsy >>   LINES: R IJ HD (per IR) 3/14 >>   ASSESSMENT / PLAN:  Discussion:  71 y/o F with hx of pulmonary nodules and prior lung biopsy with possible inflammatory pseudotumor vs reaction to old bronchopneumonia associated with some carcinoid tumorlets admitted with AKI and positive MPO/Anti-GBM as an outpatient.  She has had extensive autoimmune work up over the years.  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  She was started on therapy to include steroids, cyclophosphamide, and plasma exchange. Hopefully, this is a unifying diagnosis with pulmonary & renal manifestations but prior lung biopsy with neuroendocrine findings complicates the picture.  Fortunately, she has not had any hemoptysis.       Pulmonary Nodules / Abnormal CT Chest Acute Kidney Injury Suspected ANCA Vasculitis / Anti-GBM   Plan: Continue plasmapheresis, cyclophosphamide and prednisone per Nephrology  Assess CXR  Begin prophylactic Bactrim three times weekly Consider repeat CT imaging on Friday 3/23 (after ~ 1 week of therapy) and again in two months for review If comparative CT in ~ 2 months unchanged and no renal recovery, not sure that there is a benefit of continued therapy Monitor for hemoptysis  Follow up with Dr. Elsworth Soho at discharge > had prior planned PET in May 2018 to review pulmonary nodules.    Attending MD to follow.  PCCM will follow along with you.  Thank you for the consultation.    Noe Gens, NP-C [facility] Pgr: [phone] or if no answer [phone] 07/26/2016,  2:26 PM   Attending Note:  I have examined patient, reviewed labs, studies and notes. I have discussed the case with B Ollis, and I agree with the data and plans as amended above.   Very interesting case of 71 yo woman followed in our office for pulmonary nodular disease. She has been admitted with acute renal failure and evidence for ANCA vasculitis, a-GBM disease, on serologies and on renal bx. CT scan sinuses also consistent. She has been treated aggressively w phoresis, cytoxan, steroids, but unclear whether she will regain renal function. I have reviewed her past CT's of the chest. Most recent was 06/20/16, shows several rounded but semi-solid nodules. The appearance could also be consistent with Goodpasture's. The picture is clouded by fact that she had a lung bx 8 years ago that did not show vasculitis but instead showed chronic inflammation with carcinoid tumorlets. At this point unclear that the there is a  unifying diagnosis.   Recommend repeat CT chest this week to look for interval change after immunosuppression and more importantly to establish current baseline appearance of the nodules. Agree with treating with maintenance cytoxan and prednisone for at least 2 months, follow a-GBM Ab serology for clearance, then repeat CT chest to assess the behavior of the nodules on therapy. Alternatively could perform PET at that time as Dr Elsworth Soho had been planning. The post-treatment imaging will guide our decisions regarding need for nodule bx if we believe this is a separate process vs continued therapy, etc.    Baltazar Apo, MD, PhD 07/27/2016, 7:49 AM [facility] [phone] or if no answer [phone]

## 2016-07-26 NOTE — Progress Notes (Signed)
Patient ID: [name], female   DOB: July 30, 1945, 71 y.o.   MRN: [id number] [facility] Progress Note   Assessment/ Plan:   1. 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 Started last week on alternate day plasmapheresis with intended duration of 2 weeks- next TPE tomorrow.  Cyclophosphamide initiation was delayed I guess to be based on the kidney biopsy- wondered if she might need it based on her upper resp symptoms and now that biopsy- start at 100 daily oral. At this time, she is anuric and without any signs of renal recovery  continue dialysis on a TTS schedule- done today- plan for next on Thursday. 2. Hypertension:  blood pressures improved with resumption of antihypertensive therapy/dialysis. 3. Anemia: Likely anemia of chronic disease with recent hospitalizations/vasculitis. She has low iron saturation with prohibitively high ferritin for intravenous iron bolusing. Now on Aranesp with hemodialysis. Transfused today  4. Hyperphosphatemia: Begin low phosphorus diet and monitor with hemodialysis. PTH 109, continue to monitor- no vit D. 5. Upper respiratory symptoms- she says better with only steroids and plasmapheresis.  Starting cytoxan but would also like to have pulm opinion on immune drugs only because I am not sure how long will be fruitful if no recovery of renal funciton   Subjective:   She looks good- is distressed regarding her future    Objective:   BP (!) 155/68 (BP Location: Right Arm)   Pulse 68   Temp 98.9 F (37.2 C) (Oral)   Resp 17   Ht 5\' 3"  (1.6 m)   Wt 60.6 kg (133 lb 9.6 oz) Comment: Standing Weight  SpO2 100%   BMI 23.67 kg/m   Intake/Output Summary (Last 24 hours) at 07/26/16  1350 Last data filed at 07/26/16 1112  Gross per 24 hour  Intake              390 ml  Output             1165 ml  Net             -775 ml   Weight change: 1.8 kg (3 lb 15.5 oz)  Physical Exam: Gen: Comfortably resting in bed Right IJ PC CVS: Pulse regular rhythm, normal S1 and S2 Resp: Clear to auscultation, no rales/rhonchi Abd: Soft, flat, nontender Ext: No lower extremity edema  Imaging: No results found.  Labs: BMET  Recent Labs Lab 07/20/16 1250 07/21/16 0543 07/22/16 0739 07/23/16 0129 07/23/16 0605 07/23/16 1529 07/25/16 1003 07/25/16 1406 07/25/16 1851 07/26/16 0520  NA 135 133* 136 133* 137 134* 134* 134* 135 135  K 3.8 4.7 4.0 3.7 3.9 3.8 3.8 3.7 4.3 4.1  CL 93* 96* 95* 96* 99* 95* 100* 98* 106 104  CO2 23 20* 25  --  23  --  21*  --  19* 20*  GLUCOSE 83 155* 164* 180* 172* 122* 157* 172* 160* 141*  BUN 84* 49* 28* 39* 41* 9 39* 40* 42* 48*  CREATININE 13.91* 9.17* 4.89* 6.00* 5.37* 1.60* 4.61* 5.00* 4.64* 5.20*  CALCIUM 8.2* 7.8* 8.2*  --  8.5*  --  8.8*  --  8.5* 8.4*  PHOS  --   --   --   --   --   --   --   --  3.7 4.1   CBC  Recent Labs Lab 07/20/16 1250  07/23/16 0605  07/25/16 1003 07/25/16 1406 07/25/16 1851 07/26/16 0520  WBC 8.6  < > 7.8  --  9.3  --  9.7 7.4  NEUTROABS 6.4  --   --   --   --   --   --   --   HGB 9.3*  < > 8.4*  < > 8.5* 8.2* 8.2* 7.2*  HCT 28.1*  < > 25.7*  < > 25.4* 24.0* 24.4* 21.4*  MCV 84.1  < > 85.7  --  84.1  --  83.8 82.9  PLT 283  < > 217  --  190  --  156 139*  < > = values in this interval not displayed. Medications:    . sodium chloride   Intravenous Once  . amLODipine  5 mg Oral Daily  . aspirin EC  81 mg Oral Daily  . atorvastatin  10 mg Oral q1800  . darbepoetin (ARANESP) injection - DIALYSIS  60 mcg Intravenous Q Sat-HD  . feeding supplement (PRO-STAT SUGAR FREE 64)  30 mL Oral BID  . fluticasone  2 spray Each Nare QHS  . loratadine  10 mg Oral Daily  . pantoprazole  40 mg Oral Daily  .  predniSONE  60 mg Oral Q breakfast  . sodium chloride  2 spray Each Nare TID  . sodium chloride flush  3 mL Intravenous Q12H   Jolina Symonds A  07/26/2016, 1:50 PM

## 2016-07-26 NOTE — Procedures (Signed)
Patient was seen on dialysis and the procedure was supervised.  BFR 400  Via PC BP is  126/73.   Patient appears to be tolerating treatment well  [name] A 07/26/2016

## 2016-07-27 DIAGNOSIS — R0902 Hypoxemia: Secondary | ICD-10-CM

## 2016-07-27 DIAGNOSIS — R042 Hemoptysis: Secondary | ICD-10-CM

## 2016-07-27 LAB — CBC
HCT: 25 % — ABNORMAL LOW (ref 36.0–46.0)
HCT: 28.8 % — ABNORMAL LOW (ref 36.0–46.0)
Hemoglobin: 8.5 g/dL — ABNORMAL LOW (ref 12.0–15.0)
Hemoglobin: 9.5 g/dL — ABNORMAL LOW (ref 12.0–15.0)
MCH: 28.1 pg (ref 26.0–34.0)
MCH: 27.7 pg (ref 26.0–34.0)
MCHC: 34 g/dL (ref 30.0–36.0)
MCHC: 33 g/dL (ref 30.0–36.0)
MCV: 82.8 fL (ref 78.0–100.0)
MCV: 84 fL (ref 78.0–100.0)
PLATELETS: 136 10*3/uL — AB (ref 150–400)
PLATELETS: 167 10*3/uL (ref 150–400)
RBC: 3.02 MIL/uL — AB (ref 3.87–5.11)
RBC: 3.43 MIL/uL — ABNORMAL LOW (ref 3.87–5.11)
RDW: 15.8 % — ABNORMAL HIGH (ref 11.5–15.5)
RDW: 16.2 % — ABNORMAL HIGH (ref 11.5–15.5)
WBC: 10.2 10*3/uL (ref 4.0–10.5)
WBC: 12.5 10*3/uL — AB (ref 4.0–10.5)

## 2016-07-27 LAB — BPAM RBC
Blood Product Expiration Date: 201803282359
ISSUE DATE / TIME: 201803200947
Unit Type and Rh: 600

## 2016-07-27 LAB — RENAL FUNCTION PANEL
Albumin: 3.3 g/dL — ABNORMAL LOW (ref 3.5–5.0)
Albumin: 3.6 g/dL (ref 3.5–5.0)
Anion gap: 8 (ref 5–15)
Anion gap: 11 (ref 5–15)
BUN: 22 mg/dL — AB (ref 6–20)
BUN: 26 mg/dL — AB (ref 6–20)
CALCIUM: 8.2 mg/dL — AB (ref 8.9–10.3)
CHLORIDE: 100 mmol/L — AB (ref 101–111)
CO2: 29 mmol/L (ref 22–32)
CO2: 23 mmol/L (ref 22–32)
CREATININE: 3.27 mg/dL — AB (ref 0.44–1.00)
CREATININE: 3.71 mg/dL — AB (ref 0.44–1.00)
Calcium: 8.2 mg/dL — ABNORMAL LOW (ref 8.9–10.3)
Chloride: 100 mmol/L — ABNORMAL LOW (ref 101–111)
GFR, EST AFRICAN AMERICAN: 15 mL/min — AB (ref >60)
GFR, EST AFRICAN AMERICAN: 13 mL/min — AB (ref >60)
GFR, EST NON AFRICAN AMERICAN: 13 mL/min — AB (ref >60)
GFR, EST NON AFRICAN AMERICAN: 11 mL/min — AB (ref >60)
Glucose, Bld: 117 mg/dL — ABNORMAL HIGH (ref 65–99)
Glucose, Bld: 154 mg/dL — ABNORMAL HIGH (ref 65–99)
Phosphorus: 2.7 mg/dL (ref 2.5–4.6)
Phosphorus: 3.1 mg/dL (ref 2.5–4.6)
Potassium: 3.9 mmol/L (ref 3.5–5.1)
Potassium: 4.3 mmol/L (ref 3.5–5.1)
SODIUM: 137 mmol/L (ref 135–145)
SODIUM: 134 mmol/L — AB (ref 135–145)

## 2016-07-27 LAB — TYPE AND SCREEN
ABO/RH(D): A POS
Antibody Screen: NEGATIVE
Unit division: 0

## 2016-07-27 LAB — POCT I-STAT, CHEM 8
BUN: 29 mg/dL — ABNORMAL HIGH (ref 6–20)
CALCIUM ION: 1.13 mmol/L — AB (ref 1.15–1.40)
Chloride: 96 mmol/L — ABNORMAL LOW (ref 101–111)
Creatinine, Ser: 3.6 mg/dL — ABNORMAL HIGH (ref 0.44–1.00)
GLUCOSE: 156 mg/dL — AB (ref 65–99)
HCT: 27 % — ABNORMAL LOW (ref 36.0–46.0)
HEMOGLOBIN: 9.2 g/dL — AB (ref 12.0–15.0)
Potassium: 3.9 mmol/L (ref 3.5–5.1)
Sodium: 137 mmol/L (ref 135–145)
TCO2: 29 mmol/L (ref 0–100)

## 2016-07-27 MED ORDER — ACETAMINOPHEN 325 MG PO TABS: 650.0000 mg | ORAL_TABLET | ORAL | Status: DC | PRN

## 2016-07-27 MED ORDER — SODIUM CHLORIDE 0.9 % IV SOLN
INTRAVENOUS | Status: AC
  Administered 2016-07-27 (×3): via INTRAVENOUS_CENTRAL
  Filled 2016-07-27 (×3): qty 200

## 2016-07-27 MED ORDER — SODIUM CHLORIDE 0.9 % IV SOLN
4.0000 g | Freq: Once | INTRAVENOUS | Status: AC
  Administered 2016-07-27: 4 g via INTRAVENOUS
  Filled 2016-07-27 (×2): qty 40

## 2016-07-27 MED ORDER — DARBEPOETIN ALFA 150 MCG/0.3ML IJ SOSY
150.0000 ug | PREFILLED_SYRINGE | INTRAMUSCULAR | Status: DC
  Administered 2016-07-30 – 2016-08-06 (×2): 150 ug via INTRAVENOUS
  Filled 2016-07-27 (×2): qty 0.3

## 2016-07-27 MED ORDER — ACD FORMULA A 0.73-2.45-2.2 GM/100ML VI SOLN
Status: AC
  Filled 2016-07-27: qty 500

## 2016-07-27 MED ORDER — ZOLPIDEM TARTRATE 5 MG PO TABS
5.0000 mg | ORAL_TABLET | Freq: Once | ORAL | Status: AC
  Administered 2016-07-27: 5 mg via ORAL
  Filled 2016-07-27: qty 1

## 2016-07-27 MED ORDER — HEPARIN SODIUM (PORCINE) 1000 UNIT/ML DIALYSIS: 20.0000 [IU]/kg | INTRAMUSCULAR | Status: DC | PRN

## 2016-07-27 MED ORDER — CALCIUM CARBONATE ANTACID 500 MG PO CHEW
2.0000 | CHEWABLE_TABLET | ORAL | Status: DC
  Administered 2016-07-27: 400 mg via ORAL
  Filled 2016-07-27: qty 2

## 2016-07-27 MED ORDER — DIPHENHYDRAMINE HCL 25 MG PO CAPS: 25.0000 mg | ORAL_CAPSULE | Freq: Four times a day (QID) | ORAL | Status: DC | PRN

## 2016-07-27 MED ORDER — SENNA 8.6 MG PO TABS
1.0000 | ORAL_TABLET | Freq: Two times a day (BID) | ORAL | Status: DC | PRN
  Administered 2016-07-27 – 2016-07-28 (×2): 8.6 mg via ORAL
  Filled 2016-07-27 (×3): qty 1

## 2016-07-27 MED ORDER — ACD FORMULA A 0.73-2.45-2.2 GM/100ML VI SOLN
500.0000 mL | Status: DC
Start: 2016-07-27 — End: 2016-07-27
  Administered 2016-07-27: 500 mL via INTRAVENOUS
  Filled 2016-07-27: qty 500

## 2016-07-27 MED ORDER — HEPARIN SODIUM (PORCINE) 1000 UNIT/ML IJ SOLN: 1000.0000 [IU] | Freq: Once | INTRAMUSCULAR | Status: DC

## 2016-07-27 NOTE — Progress Notes (Signed)
Name: [name] MRN: [id number] DOB: 06/14/1945    ADMISSION DATE:  07/20/2016 CONSULTATION DATE:  3/201/18  REFERRING MD :  Dr. Charlies Silvers   CHIEF COMPLAINT:  Acute Renal Failure   HISTORY OF PRESENT ILLNESS:  71 y/o F, former smoker (quit 1986), admitted on 3/14 from Dr. Sanda Klein office with acute renal failure.  She is followed by Dr. Elsworth Soho (since) for abnormal CT imaging & pulmonary nodules.  Work up for this has included an open lung biopsy in 11/2008 in the setting of fever/night sweats + CT scan with pulmonary nodules which showed possible inflammatory pseudotumor vs reaction to old bronchopneumonia associated with some carcinoid tumorlets, saliva gland biospy 03/2009 which was negative for Sjogren's disease and negative autoimmune work up.  Serial CT's were planned but she was lost to follow up.  She had a repeat CT of the chest in 06/2016 which showed volume loss, post surgical changes of the left hemithorax and RLL pulmonary nodules.     The patient was in the process of work up for renal failure as an outpatient.  She was referred to the ER with a serum creatinine of 12 per report (normal sr cr 09/10/15).  She recently was hospitalized in January for PNA.  Also, she has had multiple rounds of sinusitis and treated with corticosteroids + abx.  Issue was first noted on 06/19/16 with a serum creatinine of 2.1 / BUN 18.  She was admitted per [facility] for further evaluation.  Admit labs - sr cr 13.91 / BUN 84, albumin 2, Hgb 7.2 and platelets 139.  CT of the sinuses was assessed on admit which showed normally aerated paranasal sinuses and patent sinus drainage.  She had a tunneled HD catheter placed on admit per IR in the setting of AKI.  Outpatient Nephrology work up was positive for myeloperoxidase antibodies and anti-GBM.   She further underwent a renal biopsy on 3/15 given concerns for AKI and possible ANCA vasculitis.  She was subsequently started on plasmaphoresis and steroids.  Cyclophosphamide  initiated on 3/15.  Surgical pathology pending.  Repeat MPO + anti-GBM antibodies pending this admit.  PCCM consulted for evaluation to assist with timing of re-imaging in the setting of abnormal CT.       Pt denies hemoptysis, nose bleeds, cough.  Reports 25-30 lbs weight loss.     SUBJECTIVE:  Pt in good spirits - "lot to keep up with but I am doing OK".  No acute events.   VITAL SIGNS: Temp:  [97.2 F (36.2 C)-99.2 F (37.3 C)] 99 F (37.2 C) (03/21 0520) Pulse Rate:  [62-72] 71 (03/21 0520) Resp:  [16-18] 18 (03/21 0520) BP: (118-159)/(56-94) 145/57 (03/21 0520) SpO2:  [98 %-100 %] 98 % (03/21 0520) Weight:  [133 lb 9.6 oz (60.6 kg)] 133 lb 9.6 oz (60.6 kg) (03/20 2155)  PHYSICAL EXAMINATION: General:  Well developed adult female in NAD HEENT: MM pink/moist, no jvd Neuro: AAOx4, speech clear, MAE CV: s1s2 rrr, no m/r/g PULM: even/non-labored, lungs bilaterally clear WU:JWJX, non-tender, bsx4 active  Extremities: warm/dry, no edema  Skin: no rashes or lesions     Recent Labs Lab 07/25/16 1851 07/26/16 0520 07/27/16 0730  NA 135 135 137  K 4.3 4.1 3.9  CL 106 104 100*  CO2 19* 20* 29  BUN 42* 48* 22*  CREATININE 4.64* 5.20* 3.27*  GLUCOSE 160* 141* 117*     Recent Labs Lab 07/25/16 1851 07/26/16 0520 07/27/16 0730  HGB 8.2* 7.2* 8.5*  HCT 24.4* 21.4* 25.0*  WBC 9.7 7.4 10.2  PLT 156 139* 136*    Dg Chest Port 1 View  Result Date: 07/26/2016 CLINICAL DATA:  Pulmonary nodules EXAM: PORTABLE CHEST 1 VIEW COMPARISON:  06/19/2016, chest CT 06/20/2016 FINDINGS: Right dialysis catheter is in place with the tip at the cavoatrial junction. Postoperative changes on the left. Areas of scarring and nodularity in the left lung are stable. Decreasing airspace opacities in the right lung with some residual areas of nodularity. Heart is borderline in size. No visible effusions. IMPRESSION: Stable scarring and nodularity in the left lung with postoperative changes.  Improving areas of nodularity in the right lung. Electronically Signed   By: Rolm Baptise M.D.   On: 07/26/2016 16:30      SIGNIFICANT EVENTS  3/14  Admit with AKI, tunneled HD cath placed per IR 3/15  Renal US biopsy per IR, solumedrol initiated 3/16  Plasmapheresis initiated 3/20  Cyclophosphamide initiated   STUDIES:  3/14  CT Sinuses >> normally aerated paranasal sinuses and patent sinus drainage 3/15  Renal US Biopsy >>   LINES: R IJ HD (per IR) 3/14 >>   ASSESSMENT / PLAN:  Discussion:  71 y/o F with hx of pulmonary nodules and prior lung biopsy with possible inflammatory pseudotumor vs reaction to old bronchopneumonia associated with some carcinoid tumorlets admitted with AKI and positive MPO/Anti-GBM as an outpatient.  She has had extensive autoimmune work up over the years to r/o autoimmune disease.  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  She was started on therapy to include steroids, cyclophosphamide, and plasma exchange. Hopefully, this is a unifying diagnosis with pulmonary & renal manifestations but prior lung biopsy with neuroendocrine findings complicates the picture.  Fortunately, she has not had any hemoptysis.       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  If comparative CT in 2 months unchanged and no evidence of renal recover, doubt that there is benefit of continued therapy Monitor for hemoptysis Follow up with Dr. Elsworth Soho at discharge > had prior PET arranged for May 2018 to review pulmonary nodules  Post treatment imaging will guide PCCM regarding  if nodules are a separate process vs related and if biopsy needed  PCCM will follow up on Friday 3/23 after CT completed.   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  Patient undergoing plasmapheresis and cyc and prednisone with some improvement of hemoptysis.  On exam, lungs are clear.  I reviewed CT myself, infiltrate and nodules noted.  Discussed with PCCM-NP.  Hemoptysis:   - Monitor amount.  - If large volume may require bronch vs embolization  Goodpasture's syndrome:  - Plasmapheresis  - Cyclophosphamide  - Prednisone  - Repeat imaging on Friday   Hypoxemia:  - Titrate O2 for sat of 88-92%.  - May need an ambulatory desaturation study prior to discharge for home O2.  PCCM will see again on Friday 3/23.  Patient seen and examined, agree with above  note.  I dictated the care and orders written for this patient under my direction.  Rush Farmer, MD [phone]

## 2016-07-27 NOTE — Evaluation (Signed)
Physical Therapy Evaluation Patient Details Name: [name] MRN: [id number] DOB: [birth year] Today's Date: 07/27/2016   History of Present Illness  [name] is a 71 y.o. female with medical history significant of COPD, esophageal stricture, GERD, hiatal hernia, hyperlipidemia, hypertension, IBS presenting with acute renal failure necessitating aggressive workup and treatment.  Clinical Impression  Pt admitted with/for renal failure and weakness on evaluation.  Pt currently limited functionally due to the problems listed below.  (see problems list.)  Pt will benefit from PT to maximize function and safety to be able to get home safely with limited available assist.     Follow Up Recommendations Home health PT;Supervision - Intermittent;Other (comment) (limited assist may need ST SNF if doesn't take well to HD)    Equipment Recommendations  None recommended by PT;Other (comment) (TBA)    Recommendations for Other Services       Precautions / Restrictions Precautions Precautions: Fall      Mobility  Bed Mobility Overal bed mobility: Needs Assistance Bed Mobility: Supine to Sit;Sit to Supine     Supine to sit: Supervision Sit to supine: Supervision   General bed mobility comments: extra time, but no assist  Transfers Overall transfer level: Needs assistance   Transfers: Sit to/from Stand Sit to Stand: Min guard         General transfer comment: used UE's, but no assist  Ambulation/Gait Ambulation/Gait assistance: Min assist Ambulation Distance (Feet): 150 Feet Assistive device: 1 person hand held assist Gait Pattern/deviations: Step-through pattern Gait velocity: slower Gait velocity interpretation: Below normal speed for age/gender General Gait Details: mildly unsteady and weak-kneed gait in need of stability assist  Stairs            Wheelchair Mobility    Modified Rankin (Stroke Patients Only)       Balance Overall balance  assessment: Needs assistance Sitting-balance support: No upper extremity supported Sitting balance-Leahy Scale: Good     Standing balance support: No upper extremity supported;Single extremity supported Standing balance-Leahy Scale: Fair                               Pertinent Vitals/Pain Pain Assessment: Faces Faces Pain Scale: No hurt    Home Living Family/patient expects to be discharged to:: Private residence Living Arrangements: Alone Available Help at Discharge: Family;Available PRN/intermittently (cousin lives close by) Type of Home: House Home Access: Stairs to enter Entrance Stairs-Rails: None Entrance Stairs-Number of Steps: 1 Home Layout: One level Home Equipment: Other (comment) (TBA)      Prior Function Level of Independence: Independent         Comments: was driven to get groceries, etc     Hand Dominance        Extremity/Trunk Assessment   Upper Extremity Assessment Upper Extremity Assessment: Defer to OT evaluation    Lower Extremity Assessment Lower Extremity Assessment: Overall WFL for tasks assessed (general proximal weaknee, grossly 4/5)       Communication   Communication: No difficulties  Cognition Arousal/Alertness: Awake/alert   Overall Cognitive Status: Within Functional Limits for tasks assessed                      General Comments      Exercises     Assessment/Plan    PT Assessment Patient needs continued PT services  PT Problem List Decreased activity tolerance;Decreased strength;Decreased balance;Decreased mobility       PT  Treatment Interventions Gait training;Stair training;Functional mobility training;Therapeutic activities;Balance training;Patient/family education    PT Goals (Current goals can be found in the Care Plan section)  Acute Rehab PT Goals Patient Stated Goal: Get back home PT Goal Formulation: With patient Time For Goal Achievement: 08/10/16 Potential to Achieve Goals:  Good    Frequency Min 3X/week   Barriers to discharge Decreased caregiver support pt has limited assist and does not know initially how she will handle HD    Co-evaluation               End of Session   Activity Tolerance: Patient tolerated treatment well Patient left: in bed;with call bell/phone within reach;with nursing/sitter in room Nurse Communication: Mobility status PT Visit Diagnosis: Unsteadiness on feet (R26.81);Muscle weakness (generalized) (M62.81)         Time: 2620-3559 PT Time Calculation (min) (ACUTE ONLY): 14 min   Charges:   PT Evaluation $PT Eval Moderate Complexity: 1 Procedure     PT G CodesTessie Fass Elmond Poehlman 07/27/2016, 5:22 PM 07/27/2016  Donnella Sham, [place] [phone]  (pager)

## 2016-07-27 NOTE — Progress Notes (Addendum)
Patient ID: [name], female   DOB: 11/21/1945, 71 y.o.   MRN: [id number]  PROGRESS NOTE    [name]  QAS:[id number] DOB: 1945-08-22 DOA: 07/20/2016  PCP: Merrilee Seashore, MD   Brief Narrative:  71 y.o. female with medical history significant for COPD, esophageal stricture, GERD, hiatal hernia, hyperlipidemia, hypertension, IBS, cervical disc disease and history of lung nodules with extensive workup including lung biopsy ruling out Sjogren's disease. Pt has had recurrent history of sinusitis since last fall, treated with several rounds of antibiotics and corticosteroids which have not resulted in significant clinical improvement. She was hospitalized 2 months ago for fever/weight loss and relative hypotension with anorexia and night sweats and was treated for community-acquired pneumonia. She has continued to have sinus drainage with some epistaxis and bloody secretions and also had nausea with dysgeusia for the past few weeks or so. Pt also reported severe muscle pain and leg cramps over the same time period. Of note, pt is followed by Dr. Elsworth Soho for abnormal CT imaging & pulmonary nodules.  Work up for this has included an open lung biopsy in 11/2008 in the setting of fever/night sweats and CT scan with pulmonary nodules which showed possible inflammatory pseudotumor vs reaction to old bronchopneumonia associated with some carcinoid tumorlets. Salivary  gland biospy 03/2009 was negative for Sjogren's disease and negative for autoimmune work up.  Serial CT's were planned but she was lost to follow up. She had a repeat CT of the chest in 06/2016 which showed volume loss, post surgical changes of the left hemithorax and RLL pulmonary nodules.   In regards to pt's creatinine, in January of 2018 was 1.7 and in February 2018 it ranged between 1.6 and 2.1 with a peak of 2.8 that prompted referral to nephrology. After evaluation 07/20/16, she was found to have an elevated creatinine level of 12.4  and given the high risk of suspicion for vasculitis pt was directly admitted to hospital for evaluation.  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 She was on solumedrol and then switched to PO prednisone. She is required to have plasmapheresis on alternate days for 2 weeks (started 07/22/2016).   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 Since we have biospy results available renal started today 100 mg daily  - She is on HD TTS - Hep B negative. ESR and CRP elevated   Essential hypertension - Continue Norvasc   Anemia of chronic renal disease - Ferritin 1163, iron 25, TIBC 133 - consistent with anemia of chronic disease - Continue aranesp on Saturday with HD  Hyperphosphatemia - Low phosphorus diet, monitor with hemodialysis - PTH level 109  Dyslipidemia - Continue Lipitor   RLL pulmonary nodules - Appreciate pulmonary input; consulted by renal to assist with timing of re-imaging in the  setting of abnormal CT  - Pulm recommended prophylactic bactrim while on immunosuppresion - Repeat CT imaging on Friday 3/23 and agin in two months for review.  If comparative CT in 2 months unchanged and no evidence of renal recover, doubt that there is benefit  of continued therapy - Monitor for hemoptysis - Follow up with Dr. Elsworth Soho at discharge > had prior PET arranged for May 2018 to review pulmonary nodules  - Post treatment imaging will guide PCCM regarding if nodules are a separate process vs related and if biopsy is needed  DVT prophylaxis: SCD's bilaterally  Code Status: full code  Family Communication: no family at the bedside this am Disposition Plan: home once cleared by renal    Consultants:   Nephrology  IR  Pulmonary 07/27/16  Procedures:   RIJ cathter for HD placed 07/20/2016  Renal biopsy 07/21/2016  Plasmapheresis 07/22/16  HD TTS  Cytoxan - started 07/27/16  Antimicrobials:   None    Subjective: No overnight events.   Objective: Vitals:   07/26/16 1738 07/26/16 2155 07/27/16 0520 07/27/16 0950  BP: (!) 140/56 (!) 147/65 (!) 145/57 128/66  Pulse: 65 62 71 74  Resp: '16 16 18 18  ' Temp: 99.2 F (37.3 C) 98.9 F (37.2 C) 99 F (37.2 C) 98.4 F (36.9 C)  TempSrc: Oral Oral Oral Oral  SpO2: 100% 100% 98% 100%  Weight:  60.6 kg (133 lb 9.6 oz)    Height:        Intake/Output Summary (Last 24 hours) at 07/27/16 1432 Last data filed at 07/27/16 0601  Gross per 24 hour  Intake                0 ml  Output                0 ml  Net                0 ml   Filed Weights   07/25/16 2135 07/26/16 0700 07/26/16 2155  Weight: 61.7 kg (136 lb 0.4 oz) 60.6 kg (133 lb 9.6 oz) 60.6 kg (133 lb 9.6 oz)    Examination:  General exam: no acute distress  Respiratory system: No wheezing, no rhonchi Cardiovascular system: S1 & S2 heard, has right IJ tunneled cathter placed Gastrointestinal system: (+) BS, no distention  Central nervous system: Non focal  Extremities: palpable pulses bilaterally, no swelling  Skin: no ulcers or lesions  Psychiatry: Normal mood and affect   Data Reviewed: I have personally reviewed following labs and imaging studies  CBC:  Recent Labs Lab 07/23/16 0605  07/25/16 1003  07/25/16 1406 07/25/16 1851 07/26/16 0520 07/27/16 0730  WBC 7.8  --  9.3  --  9.7 7.4 10.2  HGB 8.4*  < > 8.5* 8.2* 8.2* 7.2* 8.5*  HCT 25.7*  < > 25.4* 24.0* 24.4* 21.4* 25.0*  MCV 85.7  --  84.1  --  83.8 82.9 82.8  PLT 217  --  190  --  156 139* 136*  < > = values in this interval not displayed. Basic Metabolic Panel:  Recent Labs Lab 07/23/16 0605  07/25/16 1003 07/25/16 1406 07/25/16 1851 07/26/16 0520 07/27/16 0730  NA 137  < > 134* 134* 135 135 137  K 3.9  < > 3.8 3.7 4.3 4.1 3.9  CL 99*  < > 100* 98* 106 104 100*  CO2 23  --  21*  --  19* 20* 29  GLUCOSE 172*  < >  157* 172* 160* 141* 117*  BUN 41*  < > 39* 40* 42* 48* 22*  CREATININE 5.37*  < > 4.61* 5.00* 4.64* 5.20* 3.27*  CALCIUM 8.5*  --  8.8*  --  8.5* 8.4* 8.2*  PHOS  --   --   --   --  3.7 4.1 2.7  < > = values in this interval not displayed. GFR: Estimated Creatinine Clearance: 12.9 mL/min (A) (by C-G formula based on SCr of 3.27 mg/dL (H)). Liver Function Tests:  Recent Labs Lab 07/21/16 0543 07/25/16 1851 07/26/16 0520 07/27/16 0730  AST 14*  --   --   --   ALT 6*  --   --   --   ALKPHOS 80  --   --   --   BILITOT 1.2  --   --   --   PROT 5.9*  --   --   --   ALBUMIN 1.7* 4.4 3.5 3.3*   No results for input(s): LIPASE, AMYLASE in the last 168 hours. No results for input(s): AMMONIA in the last 168 hours. Coagulation Profile: No results for input(s): INR, PROTIME in the last 168 hours. Cardiac Enzymes: No results for input(s): CKTOTAL, CKMB, CKMBINDEX, TROPONINI in the last 168 hours. BNP (last 3 results) No results for input(s): PROBNP in the last 8760 hours. HbA1C: No results for input(s): HGBA1C in the last 72 hours. CBG: No results for input(s): GLUCAP in the last 168 hours. Lipid Profile: No results for input(s): CHOL, HDL, LDLCALC, TRIG, CHOLHDL, LDLDIRECT in the last 72 hours. Thyroid Function Tests: No results for input(s): TSH, T4TOTAL, FREET4, T3FREE, THYROIDAB in the last 72  hours. Anemia Panel: No results for input(s): VITAMINB12, FOLATE, FERRITIN, TIBC, IRON, RETICCTPCT in the last 72 hours. Urine analysis:    Component Value Date/Time   COLORURINE STRAW (A) 06/20/2016 0129   APPEARANCEUR CLEAR 06/20/2016 0129   LABSPEC 1.008 06/20/2016 0129   PHURINE 7.0 06/20/2016 0129   GLUCOSEU NEGATIVE 06/20/2016 0129   GLUCOSEU NEGATIVE 06/03/2014 1038   HGBUR MODERATE (A) 06/20/2016 0129   BILIRUBINUR NEGATIVE 06/20/2016 0129   KETONESUR NEGATIVE 06/20/2016 0129   PROTEINUR NEGATIVE 06/20/2016 0129   UROBILINOGEN 0.2 06/03/2014 1038   NITRITE NEGATIVE 06/20/2016 0129   LEUKOCYTESUR TRACE (A) 06/20/2016 0129   Sepsis Labs: '@LABRCNTIP' (procalcitonin:4,lacticidven:4)    Recent Results (from the past 240 hour(s))  MRSA PCR Screening     Status: None   Collection Time: 07/21/16  9:06 AM  Result Value Ref Range Status   MRSA by PCR NEGATIVE NEGATIVE Final      Radiology Studies: Ir Fluoro Guide Cv Line Right Result Date: 07/20/2016 Successful placement of 19 cm tip to cuff tunneled hemodialysis catheter via the right internal jugular vein with tips terminating within the superior aspect of the right atrium. The catheter is ready for immediate use.  Ir US Guide Vasc Access Right Result Date: 07/20/2016 Successful placement of 19 cm tip to cuff tunneled hemodialysis catheter via the right internal jugular vein with tips terminating within the superior aspect of the right atrium. The catheter is ready for immediate use.  Ct Maxillofacial Wo Contrast Result Date: 07/20/2016 Normally aerated paranasal sinuses and patent sinus drainage pathways.     Scheduled Meds: . sodium chloride   Intravenous Once  . amLODipine  5 mg Oral Daily  . aspirin EC  81 mg Oral Daily  . atorvastatin  10 mg Oral q1800  . cyclophosphamide  100 mg Oral Daily  . [  START ON 07/30/2016] darbepoetin (ARANESP) injection - DIALYSIS  150 mcg Intravenous Q Sat-HD  . feeding supplement  (PRO-STAT SUGAR FREE 64)  30 mL Oral BID  . fluticasone  2 spray Each Nare QHS  . loratadine  10 mg Oral Daily  . pantoprazole  40 mg Oral Daily  . predniSONE  60 mg Oral Q breakfast  . sodium chloride  2 spray Each Nare TID  . sodium chloride flush  3 mL Intravenous Q12H  . sulfamethoxazole-trimethoprim  1 tablet Oral Once per day on Mon Wed Fri   Continuous Infusions:    LOS: 7 days    Time spent: 25 minutes  Greater than 50% of the time spent on counseling and coordinating the care.   Leisa Lenz, MD [facility] Pager [phone]  If 7PM-7AM, please contact night-coverage [email or web address] Password [facility] 07/27/2016, 2:32 PM

## 2016-07-27 NOTE — Progress Notes (Signed)
Patient ID: [name], female   DOB: 1946-02-09, 71 y.o.   MRN: [id number] [facility] Progress Note   Assessment/ Plan:   1. 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 Started last week on alternate day plasmapheresis with intended duration of 2 weeks- next TPE today.  Cyclophosphamide initiation was delayed I guess to be based on the kidney biopsy- wondered if she might need it based on her upper resp symptoms and now that biopsy- start at 100 daily oral. At this time, she is anuric and without any signs of renal recovery  continue dialysis on a TTS schedule-plan for next on Thursday. 2. Hypertension:  blood pressures improved with resumption of antihypertensive therapy/dialysis. 3. Anemia: Likely anemia of chronic disease with recent hospitalizations/vasculitis. She has low iron saturation with prohibitively high ferritin for intravenous iron bolusing. Now on Aranesp with hemodialysis. Transfused 3/20 one unit 4. Hyperphosphatemia: Begin low phosphorus diet and monitor with hemodialysis. PTH 109, continue to monitor- no vit D. 5. Upper respiratory symptoms- she says better with only steroids and plasmapheresis.  Starting cytoxan but would also like to have pulm opinion on immune drugs only because I am not sure how long will be fruitful if no recovery of renal funciton   Subjective:   She looks good- appropriate questions   Objective:   BP 128/66 (BP Location: Right Arm)   Pulse 74   Temp 98.4 F (36.9 C) (Oral)   Resp 18   Ht 5\' 3"  (1.6 m)   Wt 60.6 kg (133 lb 9.6 oz)   SpO2 100%   BMI 23.67 kg/m   Intake/Output Summary (Last 24 hours) at 07/27/16 1110 Last data filed at 07/27/16 0601  Gross per 24 hour  Intake               360 ml  Output             1165 ml  Net             -805 ml   Weight change: -1.1 kg (-2 lb 6.8 oz)  Physical Exam: Gen: Comfortably resting in bed Right IJ PC CVS: Pulse regular rhythm, normal S1 and S2 Resp: Clear to auscultation, no rales/rhonchi Abd: Soft, flat, nontender Ext: No lower extremity edema  Imaging: Dg Chest Port 1 View  Result Date: 07/26/2016 CLINICAL DATA:  Pulmonary nodules EXAM: PORTABLE CHEST 1 VIEW COMPARISON:  06/19/2016, chest CT 06/20/2016 FINDINGS: Right dialysis catheter is in place with the tip at the cavoatrial junction. Postoperative changes on the left. Areas of scarring and nodularity in the left lung are stable. Decreasing airspace opacities in the right lung with some residual areas of nodularity. Heart is borderline in size. No visible effusions. IMPRESSION: Stable scarring and nodularity in the left lung with postoperative changes. Improving areas of nodularity in the right lung. Electronically Signed   By: Rolm Baptise M.D.   On: 07/26/2016 16:30    Labs: BMET  Recent Labs Lab 07/21/16 0543 07/22/16 0739  07/23/16 0605 07/23/16 1529 07/25/16 1003 07/25/16 1406 07/25/16 1851 07/26/16 0520 07/27/16 0730  NA 133* 136  < > 137 134* 134* 134* 135 135 137  K 4.7 4.0  < > 3.9 3.8 3.8 3.7 4.3 4.1 3.9  CL 96* 95*  < >  99* 95* 100* 98* 106 104 100*  CO2 20* 25  --  23  --  21*  --  19* 20* 29  GLUCOSE 155* 164*  < > 172* 122* 157* 172* 160* 141* 117*  BUN 49* 28*  < > 41* 9 39* 40* 42* 48* 22*  CREATININE 9.17* 4.89*  < > 5.37* 1.60* 4.61* 5.00* 4.64* 5.20* 3.27*  CALCIUM 7.8* 8.2*  --  8.5*  --  8.8*  --  8.5* 8.4* 8.2*  PHOS  --   --   --   --   --   --   --  3.7 4.1 2.7  < > = values in this interval not displayed. CBC  Recent Labs Lab 07/20/16 1250  07/25/16 1003 07/25/16 1406 07/25/16 1851 07/26/16 0520 07/27/16 0730  WBC 8.6  < > 9.3  --  9.7 7.4 10.2  NEUTROABS 6.4  --   --   --   --   --   --   HGB 9.3*  < > 8.5* 8.2*  8.2* 7.2* 8.5*  HCT 28.1*  < > 25.4* 24.0* 24.4* 21.4* 25.0*  MCV 84.1  < > 84.1  --  83.8 82.9 82.8  PLT 283  < > 190  --  156 139* 136*  < > = values in this interval not displayed. Medications:    . sodium chloride   Intravenous Once  . amLODipine  5 mg Oral Daily  . aspirin EC  81 mg Oral Daily  . atorvastatin  10 mg Oral q1800  . cyclophosphamide  100 mg Oral Daily  . darbepoetin (ARANESP) injection - DIALYSIS  60 mcg Intravenous Q Sat-HD  . feeding supplement (PRO-STAT SUGAR FREE 64)  30 mL Oral BID  . fluticasone  2 spray Each Nare QHS  . loratadine  10 mg Oral Daily  . pantoprazole  40 mg Oral Daily  . predniSONE  60 mg Oral Q breakfast  . sodium chloride  2 spray Each Nare TID  . sodium chloride flush  3 mL Intravenous Q12H  . sulfamethoxazole-trimethoprim  1 tablet Oral Once per day on Mon Wed Fri   Serinity Ware A  07/27/2016, 11:10 AM

## 2016-07-27 NOTE — Progress Notes (Signed)
   07/27/16 0900  Clinical Encounter Type  Visited With Patient  Visit Type Initial;Spiritual support  Referral From Nurse  Consult/Referral To Chaplain  Spiritual Encounters  Spiritual Needs Emotional  Stress Factors  Patient Stress Factors Exhausted;Family relationships;Financial concerns    Chaplain provided emotional support and ministry of presence. Patient not significantly engaged will attempt follow up later in the day. Beuna Bolding L. Volanda Napoleon, MDiv

## 2016-07-28 LAB — RENAL FUNCTION PANEL
ALBUMIN: 3.7 g/dL (ref 3.5–5.0)
ANION GAP: 10 (ref 5–15)
BUN: 31 mg/dL — ABNORMAL HIGH (ref 6–20)
CHLORIDE: 103 mmol/L (ref 101–111)
CO2: 25 mmol/L (ref 22–32)
Calcium: 8.1 mg/dL — ABNORMAL LOW (ref 8.9–10.3)
Creatinine, Ser: 4.2 mg/dL — ABNORMAL HIGH (ref 0.44–1.00)
GFR calc Af Amer: 11 mL/min — ABNORMAL LOW (ref >60)
GFR, EST NON AFRICAN AMERICAN: 10 mL/min — AB (ref >60)
GLUCOSE: 116 mg/dL — AB (ref 65–99)
Phosphorus: 3.1 mg/dL (ref 2.5–4.6)
Potassium: 3.9 mmol/L (ref 3.5–5.1)
Sodium: 138 mmol/L (ref 135–145)

## 2016-07-28 LAB — CBC
HEMATOCRIT: 24.1 % — AB (ref 36.0–46.0)
HEMOGLOBIN: 8.2 g/dL — AB (ref 12.0–15.0)
MCH: 28.3 pg (ref 26.0–34.0)
MCHC: 34 g/dL (ref 30.0–36.0)
MCV: 83.1 fL (ref 78.0–100.0)
Platelets: 159 10*3/uL (ref 150–400)
RBC: 2.9 MIL/uL — ABNORMAL LOW (ref 3.87–5.11)
RDW: 16.1 % — ABNORMAL HIGH (ref 11.5–15.5)
WBC: 12 10*3/uL — ABNORMAL HIGH (ref 4.0–10.5)

## 2016-07-28 LAB — MPO/PR-3 (ANCA) ANTIBODIES
ANCA Proteinase 3: <3.5 U/mL (ref 0.0–3.5)
MYELOPEROXIDASE ABS: 9.8 U/mL — AB (ref 0.0–9.0)

## 2016-07-28 LAB — GLOMERULAR BASEMENT MEMBRANE ANTIBODIES: GBM AB: 33 U — AB (ref 0–20)

## 2016-07-28 NOTE — Procedures (Signed)
Patient was seen on dialysis and the procedure was supervised.  BFR 390  Via PC BP is  154/69.   Patient appears to be tolerating treatment well  Quante Pettry A 07/28/2016

## 2016-07-28 NOTE — Care Management Note (Addendum)
Case Management Note  Patient Details  Name: [name] MRN: [id number] Date of Birth: 23-Mar-1946  Subjective/Objective:       CM following for progression and d/c planning. Spoke with patient at the bedside. She states that she has a cousin who will help her after discharge and stay with her a few hours in the morning to help her with baths and get her day started. She is agreeable to [facility] PT and would like to proceed with [facility]. Referral made to [facility], clinical liaison with [facility]. Patient states she has access to RW and declined needs for other DME. She states family will assist her with transportation after discharge.              Action/Plan:   Expected Discharge Date:                  Expected Discharge Plan:  [place]  In-House Referral:  NA  Discharge planning Services  CM Consult  Post Acute Care Choice:  Home Health Choice offered to:  Patient  DME Arranged:    DME Agency:     HH Arranged:    [place] Agency:     Status of Service:  In process, will continue to follow  If discussed at Long Length of Stay Meetings, dates discussed:    Additional Comments:  Adron Bene, RN 07/28/2016, 4:14 PM

## 2016-07-28 NOTE — Progress Notes (Signed)
Physical Therapy Treatment Patient Details Name: [name] MRN: [id number] DOB: [birth year] Today's Date: 07/28/2016    History of Present Illness [name] is a 71 y.o. female with medical history significant of COPD, esophageal stricture, GERD, hiatal hernia, hyperlipidemia, hypertension, IBS presenting with acute renal failure necessitating aggressive workup and treatment.    PT Comments    Progressing steadily.  Pt at a min guard to supervision level with basic mobility and gait.  Emphasis on exercise and gait stability and stamina.   Follow Up Recommendations  Home health PT;Supervision - Intermittent;Other (comment)     Equipment Recommendations  None recommended by PT;Other (comment) (TBA)    Recommendations for Other Services       Precautions / Restrictions Precautions Precautions: Fall    Mobility  Bed Mobility Overal bed mobility: Needs Assistance       Supine to sit: Supervision Sit to supine: Supervision   General bed mobility comments: extra time, but no assist  Transfers Overall transfer level: Needs assistance   Transfers: Sit to/from Stand Sit to Stand: Supervision         General transfer comment: used UE's, but no assist  Ambulation/Gait Ambulation/Gait assistance: Min guard;Min assist Ambulation Distance (Feet): 300 Feet Assistive device: 1 person hand held assist Gait Pattern/deviations: Step-through pattern Gait velocity: slower prefered, but pt able to speed up on cue for short period. Gait velocity interpretation: Below normal speed for age/gender General Gait Details: still mildly unsteady and with weak knees.  No buckling, but pt not confident   Stairs            Wheelchair Mobility    Modified Rankin (Stroke Patients Only)       Balance Overall balance assessment: Needs assistance   Sitting balance-Leahy Scale: Good       Standing balance-Leahy Scale: Fair                       Cognition Arousal/Alertness: Awake/alert Behavior During Therapy: WFL for tasks assessed/performed Overall Cognitive Status: Within Functional Limits for tasks assessed                      Exercises General Exercises - Lower Extremity Ankle Circles/Pumps: AROM;20 reps Heel Slides: AROM;Strengthening;Both;10 reps (graded resistance) Straight Leg Raises: AROM;Strengthening;10 reps;Supine Other Exercises Other Exercises: bicep/tricep pressess bil x 10 reps    General Comments        Pertinent Vitals/Pain Pain Assessment: Faces Faces Pain Scale: No hurt    Home Living                      Prior Function            PT Goals (current goals can now be found in the care plan section) Acute Rehab PT Goals PT Goal Formulation: With patient Time For Goal Achievement: 08/10/16 Potential to Achieve Goals: Good Progress towards PT goals: Progressing toward goals    Frequency    Min 3X/week      PT Plan Current plan remains appropriate    Co-evaluation             End of Session   Activity Tolerance: Patient tolerated treatment well Patient left: in bed;with call bell/phone within reach;with nursing/sitter in room Nurse Communication: Mobility status PT Visit Diagnosis: Unsteadiness on feet (R26.81);Muscle weakness (generalized) (M62.81)     Time: 3267-1245 PT Time Calculation (min) (ACUTE ONLY): 18 min  Charges:  $Gait  Training: 8-22 mins                    G Codes:       [name] Juris Gosnell 07/28/2016, 5:14 PM 07/28/2016  Donnella Sham, [place] [phone]  (pager)

## 2016-07-28 NOTE — Progress Notes (Signed)
Patient ID: [name], female   DOB: 19-Nov-1945, 71 y.o.   MRN: [id number] [name] Progress Note   Assessment/ Plan:   1. 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 Started last week on alternate day plasmapheresis with intended duration of 2 weeks- next TPE tomorrow.  Cyclophosphamide initiation was delayed I guess to be based on the kidney biopsy- wondered if she might need it based on her upper resp symptoms and now that biopsy- start at 100 daily oral on 3/20. At this time, she was anuric but now maybe making urine - without any meaningful signs of renal recovery  continue dialysis on a TTS schedule-plan for next on today- working on CLIP.  Want to wait a little before perm access 2. Hypertension:  blood pressures improved with resumption of antihypertensive therapy/dialysis. 3. Anemia: Likely anemia of chronic disease with recent hospitalizations/vasculitis. She has low iron saturation with prohibitively high ferritin for intravenous iron bolusing. Now on Aranesp with hemodialysis. Transfused 3/20 one unit 4. Hyperphosphatemia: phos OK no binder-  PTH 109, continue to monitor- no vit D. 5. Upper respiratory symptoms- she says better with only steroids and plasmapheresis.  Starting cytoxan appreciate  pulm opinion on immune drugs only because I am not sure how long will be fruitful if no recovery of renal funciton   Subjective:   She looks good- seen on HD- said made a little urine- constipated and having heartburn   Objective:   BP (!) 148/68   Pulse 60   Temp 98.5 F (36.9 C) (Oral)   Resp 17   Ht 5\' 3"  (1.6 m)   Wt 59.6 kg (131 lb 6.3 oz)   SpO2 100%   BMI 23.28 kg/m   Intake/Output Summary (Last 24 hours) at  07/28/16 0946 Last data filed at 07/28/16 0600  Gross per 24 hour  Intake              120 ml  Output                0 ml  Net              120 ml   Weight change: -0.4 kg (-14.1 oz)  Physical Exam: Gen: Comfortably resting in bed Right IJ PC CVS: Pulse regular rhythm, normal S1 and S2 Resp: Clear to auscultation, no rales/rhonchi Abd: Soft, flat, nontender Ext: No lower extremity edema  Imaging: Dg Chest Port 1 View  Result Date: 07/26/2016 CLINICAL DATA:  Pulmonary nodules EXAM: PORTABLE CHEST 1 VIEW COMPARISON:  06/19/2016, chest CT 06/20/2016 FINDINGS: Right dialysis catheter is in place with the tip at the cavoatrial junction. Postoperative changes on the left. Areas of scarring and nodularity in the left lung are stable. Decreasing airspace opacities in the right lung with some residual areas of nodularity. Heart is borderline in size. No visible effusions. IMPRESSION: Stable scarring and nodularity in the left lung with postoperative changes. Improving areas of nodularity in the right lung. Electronically Signed   By: Rolm Baptise M.D.   On: 07/26/2016 16:30    Labs: BMET  Recent Labs Lab 07/23/16 0605  07/25/16 1003 07/25/16 1406 07/25/16 1851 07/26/16 0520 07/27/16 0730 07/27/16 1600 07/27/16 1718 07/28/16 0540  NA 137  < > 134* 134* 135 135 137  134* 137 138  K 3.9  < > 3.8 3.7 4.3 4.1 3.9 4.3 3.9 3.9  CL 99*  < > 100* 98* 106 104 100* 100* 96* 103  CO2 23  --  21*  --  19* 20* 29 23  --  25  GLUCOSE 172*  < > 157* 172* 160* 141* 117* 154* 156* 116*  BUN 41*  < > 39* 40* 42* 48* 22* 26* 29* 31*  CREATININE 5.37*  < > 4.61* 5.00* 4.64* 5.20* 3.27* 3.71* 3.60* 4.20*  CALCIUM 8.5*  --  8.8*  --  8.5* 8.4* 8.2* 8.2*  --  8.1*  PHOS  --   --   --   --  3.7 4.1 2.7 3.1  --  3.1  < > = values in this interval not displayed. CBC  Recent Labs Lab 07/26/16 0520 07/27/16 0730 07/27/16 1600 07/27/16 1718 07/28/16 0540  WBC 7.4 10.2 12.5*  --  12.0*  HGB 7.2* 8.5*  9.5* 9.2* 8.2*  HCT 21.4* 25.0* 28.8* 27.0* 24.1*  MCV 82.9 82.8 84.0  --  83.1  PLT 139* 136* 167  --  159   Medications:    . sodium chloride   Intravenous Once  . amLODipine  5 mg Oral Daily  . aspirin EC  81 mg Oral Daily  . atorvastatin  10 mg Oral q1800  . cyclophosphamide  100 mg Oral Daily  . [START ON 07/30/2016] darbepoetin (ARANESP) injection - DIALYSIS  150 mcg Intravenous Q Sat-HD  . feeding supplement (PRO-STAT SUGAR FREE 64)  30 mL Oral BID  . fluticasone  2 spray Each Nare QHS  . loratadine  10 mg Oral Daily  . pantoprazole  40 mg Oral Daily  . predniSONE  60 mg Oral Q breakfast  . sodium chloride  2 spray Each Nare TID  . sodium chloride flush  3 mL Intravenous Q12H  . sulfamethoxazole-trimethoprim  1 tablet Oral Once per day on Mon Wed Fri   Shelby Peltz A  07/28/2016, 9:46 AM

## 2016-07-28 NOTE — Progress Notes (Addendum)
Patient ID: [name], female   DOB: 07-28-1945, 71 y.o.   MRN: [id number]  PROGRESS NOTE  [name]  DPO:[id number] DOB: 05-23-1945 DOA: 07/20/2016  PCP: Merrilee Seashore, MD  Brief Narrative:  71 y.o. female with medical history significant for COPD, esophageal stricture, GERD, hiatal hernia, hyperlipidemia, hypertension, IBS, cervical disc disease and history of lung nodules with extensive workup including lung biopsy ruling out Sjogren's disease. Pt has had recurrent history of sinusitis since last fall, treated with several rounds of antibiotics and corticosteroids which have not resulted in significant clinical improvement. She was hospitalized 2 months ago for fever/weight loss and relative hypotension with anorexia and night sweats and was treated for community-acquired pneumonia. She has continued to have sinus drainage with some epistaxis and bloody secretions and also had nausea with dysgeusia for the past few weeks or so. Pt also reported severe muscle pain and leg cramps over the same time period. Of note, pt is followed by Dr. Elsworth Soho for abnormal CT imaging & pulmonary nodules.  Work up for this has included an open lung biopsy in 11/2008 in the setting of fever/night sweats and CT scan with pulmonary nodules which showed possible inflammatory pseudotumor vs reaction to old bronchopneumonia associated with some carcinoid tumorlets. Salivary  gland biospy 03/2009 was negative for Sjogren's disease and negative for autoimmune work up.  Serial CT's were planned but she was lost to follow up. She had a repeat CT of the chest in 06/2016 which showed volume loss, post surgical changes of the left hemithorax and RLL pulmonary nodules.   In regards to pt's creatinine, in January of 2018 was 1.7 and in February 2018 it ranged between 1.6 and 2.1 with a peak of 2.8 that prompted referral to nephrology. After evaluation 07/20/16, she was found to have an elevated creatinine level of 12.4 and  given the high risk of suspicion for vasculitis pt was directly admitted to hospital for evaluation.  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 She was on solumedrol and then switched to PO prednisone. She is required to have plasmapheresis on alternate days for 2 weeks (started 07/22/2016).   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 Since we have biospy results available renal started today 100 mg daily  - She is on HD TTS - Hep B negative. ESR and CRP elevated   Essential hypertension - Continue Norvasc  - reasonable inpatient control   Anemia of chronic renal disease - Ferritin 1163, iron 25, TIBC 133 - consistent with anemia of chronic disease - Continue aranesp on Saturday with HD - Hg is down from yesterday from 9.2 --> 8.2 - CBC in AM  Hyperphosphatemia - Low phosphorus diet, monitor with hemodialysis - PTH level 109  Dyslipidemia - Continue Lipitor   RLL pulmonary  nodules - Appreciate pulmonary input; consulted by renal to assist with timing of re-imaging in the setting of abnormal CT  - Pulm recommended prophylactic bactrim while on immunosuppresion - Repeat CT imaging on Friday 3/23 and agin in two months for review.  If  comparative CT in 2 months unchanged and no evidence of renal recover, doubt that there is benefit of continued therapy - Monitor for hemoptysis - Follow up with Dr. Elsworth Soho at discharge > had prior PET arranged for May 2018 to review pulmonary nodules  - Post treatment imaging will guide PCCM regarding if nodules are a separate process vs related and if biopsy is needed  DVT prophylaxis: SCD's bilaterally  Code Status: full code  Family Communication: no family at the bedside this am Disposition Plan: home once cleared by renal    Consultants:   Nephrology  IR  Pulmonary 07/27/16  Procedures:   RIJ cathter for HD placed 07/20/2016  Renal biopsy 07/21/2016  Plasmapheresis 07/22/16  HD TTS  Cytoxan - started 07/27/16  Antimicrobials:   None    Subjective: No overnight events.   Objective: Vitals:   07/28/16 1030 07/28/16 1100 07/28/16 1130 07/28/16 1214  BP: (!) 157/80 (!) 162/78 (!) 157/72 140/66  Pulse: 74 67 65 77  Resp: '17 16 17 17  ' Temp:   97.4 F (36.3 C) 99 F (37.2 C)  TempSrc:   Oral Oral  SpO2:   100% 100%  Weight:   58 kg (127 lb 13.9 oz)   Height:        Intake/Output Summary (Last 24 hours) at 07/28/16 1627 Last data filed at 07/28/16 1400  Gross per 24 hour  Intake              480 ml  Output             2000 ml  Net            -1520 ml   Filed Weights   07/27/16 2112 07/28/16 0725 07/28/16 1130  Weight: 60.4 kg (133 lb 2.5 oz) 59.6 kg (131 lb 6.3 oz) 58 kg (127 lb 13.9 oz)    Examination:  General exam: no acute distress  Respiratory system: No wheezing, no rhonchi Cardiovascular system: S1 & S2 heard, has right IJ tunneled cathter placed Gastrointestinal system: (+) BS, no distention  Central nervous system: Non focal  Extremities: palpable pulses bilaterally, no swelling  Skin: no ulcers or lesions  Psychiatry: Normal mood and affect   Data Reviewed: I have personally reviewed following labs and imaging studies  CBC:  Recent  Labs Lab 07/25/16 1851 07/26/16 0520 07/27/16 0730 07/27/16 1600 07/27/16 1718 07/28/16 0540  WBC 9.7 7.4 10.2 12.5*  --  12.0*  HGB 8.2* 7.2* 8.5* 9.5* 9.2* 8.2*  HCT 24.4* 21.4* 25.0* 28.8* 27.0* 24.1*  MCV 83.8 82.9 82.8 84.0  --  83.1  PLT 156 139* 136* 167  --  650   Basic Metabolic Panel:  Recent Labs Lab 07/25/16 1851 07/26/16 0520 07/27/16 0730 07/27/16 1600 07/27/16 1718 07/28/16 0540  NA 135 135 137 134* 137 138  K 4.3 4.1 3.9 4.3 3.9 3.9  CL 106 104 100* 100* 96* 103  CO2 19* 20* 29 23  --  25  GLUCOSE 160* 141* 117* 154* 156* 116*  BUN 42* 48* 22* 26* 29* 31*  CREATININE 4.64* 5.20* 3.27* 3.71* 3.60* 4.20*  CALCIUM 8.5* 8.4* 8.2* 8.2*  --  8.1*  PHOS 3.7 4.1 2.7 3.1  --  3.1   Liver Function Tests:  Recent Labs Lab 07/25/16 1851 07/26/16 0520 07/27/16 0730 07/27/16 1600 07/28/16 0540  ALBUMIN 4.4 3.5 3.3* 3.6 3.7   Urine analysis:    Component Value Date/Time   COLORURINE STRAW (A) 06/20/2016 0129   APPEARANCEUR CLEAR 06/20/2016 0129   LABSPEC 1.008 06/20/2016 0129   PHURINE 7.0 06/20/2016 0129   GLUCOSEU NEGATIVE 06/20/2016 0129   GLUCOSEU NEGATIVE 06/03/2014 1038   HGBUR MODERATE (A) 06/20/2016 0129   BILIRUBINUR NEGATIVE 06/20/2016 0129   KETONESUR NEGATIVE 06/20/2016 0129   PROTEINUR NEGATIVE 06/20/2016 0129   UROBILINOGEN 0.2 06/03/2014 1038   NITRITE NEGATIVE 06/20/2016 0129   LEUKOCYTESUR TRACE (A) 06/20/2016 0129    Recent Results (from the past 240 hour(s))  MRSA PCR Screening     Status: None   Collection Time: 07/21/16  9:06 AM  Result Value Ref Range Status   MRSA by PCR NEGATIVE NEGATIVE Final    Radiology Studies: Ir Fluoro Guide Cv Line Right Result Date: 07/20/2016 Successful placement of 19 cm tip to cuff tunneled hemodialysis catheter via the right internal jugular vein with tips terminating within the superior aspect of the right atrium. The catheter is ready for immediate use.  Ir US Guide Vasc Access  Right Result Date: 07/20/2016 Successful placement of 19 cm tip to cuff tunneled hemodialysis catheter via the right internal jugular vein with tips terminating within the superior aspect of the right atrium. The catheter is ready for immediate use.  Ct Maxillofacial Wo Contrast Result Date: 07/20/2016 Normally aerated paranasal sinuses and patent sinus drainage pathways.   Scheduled Meds: . sodium chloride   Intravenous Once  . amLODipine  5 mg Oral Daily  . aspirin EC  81 mg Oral Daily  . atorvastatin  10 mg Oral q1800  . cyclophosphamide  100 mg Oral Daily  . [START ON 07/30/2016] darbepoetin (ARANESP) injection - DIALYSIS  150 mcg Intravenous Q Sat-HD  . feeding supplement (PRO-STAT SUGAR FREE 64)  30 mL Oral BID  . fluticasone  2 spray Each Nare QHS  . loratadine  10 mg Oral Daily  . pantoprazole  40 mg Oral Daily  . predniSONE  60 mg Oral Q breakfast  . sodium chloride  2 spray Each Nare TID  . sodium chloride flush  3 mL Intravenous Q12H  . sulfamethoxazole-trimethoprim  1 tablet Oral Once per day on Mon Wed Fri   Continuous Infusions:    LOS: 8 days   Time spent: 25 minutes  Greater than 50% of the time spent on counseling and coordinating the care.  Faye Ramsay, MD [facility] Pager [phone]  If 7PM-7AM, please contact night-coverage [email or web address] Password [facility] 07/28/2016, 4:27 PM

## 2016-07-29 ENCOUNTER — Inpatient Hospital Stay (HOSPITAL_COMMUNITY): Payer: Medicare HMO

## 2016-07-29 ENCOUNTER — Telehealth: Payer: Self-pay | Admitting: Pulmonary Disease

## 2016-07-29 LAB — RENAL FUNCTION PANEL
ALBUMIN: 3.2 g/dL — AB (ref 3.5–5.0)
ANION GAP: 11 (ref 5–15)
BUN: 12 mg/dL (ref 6–20)
CHLORIDE: 98 mmol/L — AB (ref 101–111)
CO2: 27 mmol/L (ref 22–32)
Calcium: 7.9 mg/dL — ABNORMAL LOW (ref 8.9–10.3)
Creatinine, Ser: 2.77 mg/dL — ABNORMAL HIGH (ref 0.44–1.00)
GFR, EST AFRICAN AMERICAN: 19 mL/min — AB (ref >60)
GFR, EST NON AFRICAN AMERICAN: 16 mL/min — AB (ref >60)
Glucose, Bld: 112 mg/dL — ABNORMAL HIGH (ref 65–99)
PHOSPHORUS: 3.3 mg/dL (ref 2.5–4.6)
POTASSIUM: 3.7 mmol/L (ref 3.5–5.1)
Sodium: 136 mmol/L (ref 135–145)

## 2016-07-29 LAB — POCT I-STAT, CHEM 8
BUN: 18 mg/dL (ref 6–20)
CHLORIDE: 95 mmol/L — AB (ref 101–111)
CREATININE: 3 mg/dL — AB (ref 0.44–1.00)
Calcium, Ion: 1.12 mmol/L — ABNORMAL LOW (ref 1.15–1.40)
GLUCOSE: 102 mg/dL — AB (ref 65–99)
HEMATOCRIT: 22 % — AB (ref 36.0–46.0)
HEMOGLOBIN: 7.5 g/dL — AB (ref 12.0–15.0)
POTASSIUM: 3.5 mmol/L (ref 3.5–5.1)
Sodium: 136 mmol/L (ref 135–145)
TCO2: 28 mmol/L (ref 0–100)

## 2016-07-29 LAB — CBC
HEMATOCRIT: 23.1 % — AB (ref 36.0–46.0)
Hemoglobin: 7.8 g/dL — ABNORMAL LOW (ref 12.0–15.0)
MCH: 28.2 pg (ref 26.0–34.0)
MCHC: 33.8 g/dL (ref 30.0–36.0)
MCV: 83.4 fL (ref 78.0–100.0)
Platelets: 147 10*3/uL — ABNORMAL LOW (ref 150–400)
RBC: 2.77 MIL/uL — AB (ref 3.87–5.11)
RDW: 16 % — AB (ref 11.5–15.5)
WBC: 10.3 10*3/uL (ref 4.0–10.5)

## 2016-07-29 MED ORDER — DIPHENHYDRAMINE HCL 25 MG PO CAPS
25.0000 mg | ORAL_CAPSULE | Freq: Four times a day (QID) | ORAL | Status: DC | PRN
  Administered 2016-07-30: 25 mg via ORAL
  Filled 2016-07-29: qty 1

## 2016-07-29 MED ORDER — ACD FORMULA A 0.73-2.45-2.2 GM/100ML VI SOLN
500.0000 mL | Status: DC
  Administered 2016-07-29: 500 mL via INTRAVENOUS

## 2016-07-29 MED ORDER — SODIUM CHLORIDE 0.9 % IV SOLN
4.0000 g | Freq: Once | INTRAVENOUS | Status: AC
  Administered 2016-07-29: 4 g via INTRAVENOUS
  Filled 2016-07-29: qty 40

## 2016-07-29 MED ORDER — SODIUM CHLORIDE 0.9 % IV SOLN
INTRAVENOUS | Status: AC
  Administered 2016-07-29 (×3): via INTRAVENOUS_CENTRAL
  Filled 2016-07-29 (×3): qty 200

## 2016-07-29 MED ORDER — HEPARIN SODIUM (PORCINE) 1000 UNIT/ML IJ SOLN: 1000.0000 [IU] | Freq: Once | INTRAMUSCULAR | Status: DC

## 2016-07-29 MED ORDER — CALCIUM CARBONATE ANTACID 500 MG PO CHEW
CHEWABLE_TABLET | ORAL | Status: AC
  Filled 2016-07-29: qty 2

## 2016-07-29 MED ORDER — BOOST / RESOURCE BREEZE PO LIQD
1.0000 | Freq: Two times a day (BID) | ORAL | Status: DC
  Administered 2016-08-07: 1 via ORAL

## 2016-07-29 MED ORDER — CALCIUM CARBONATE ANTACID 500 MG PO CHEW
2.0000 | CHEWABLE_TABLET | ORAL | Status: AC
  Administered 2016-07-29: 400 mg via ORAL
  Filled 2016-07-29: qty 2

## 2016-07-29 MED ORDER — ACD FORMULA A 0.73-2.45-2.2 GM/100ML VI SOLN
Status: AC
  Filled 2016-07-29: qty 500

## 2016-07-29 MED ORDER — ACETAMINOPHEN 325 MG PO TABS: 650.0000 mg | ORAL_TABLET | ORAL | Status: DC | PRN

## 2016-07-29 NOTE — Progress Notes (Addendum)
Nutrition Follow-up  DOCUMENTATION CODES:   Not applicable  INTERVENTION:  Discontinue Prostat.  Provide Boost Breeze po BID, each supplement provides 250 kcal and 9 grams of protein.  Provide nourishment snacks (ordered).  Encourage adequate PO intake.   NUTRITION DIAGNOSIS:   Increased nutrient needs related to chronic illness as evidenced by estimated needs; ongoing  GOAL:   Patient will meet greater than or equal to 90% of their needs; progressing  MONITOR:   PO intake, Supplement acceptance, Labs, Weight trends, Skin, I & O's  REASON FOR ASSESSMENT:   Malnutrition Screening Tool    ASSESSMENT:   71 y.o. female with medical history significant for COPD, esophageal stricture, GERD, hiatal hernia, hyperlipidemia, hypertension, IBS, cervical disc disease and history of lung nodules with extensive workup including lung biopsy ruling out Sjogren's disease. She was hospitalized 2 months ago for fever/weight loss and relative hypotension with anorexia and night sweats and was treated for community-acquired pneumonia. She has continued to have sinus drainage with some epistaxis and bloody secretions and also had nausea with dysgeusia for the past few weeks or so. 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 Pt also on plasmapheresis on alternate days for 2 weeks (started 07/22/2016).  Meal completion has been varied from 25-100%. Pt reports her appetite has been improving however still have taste changes. She reports the food on her renal diet is bland and she is finding it difficult to consume her foods at meals. Pt currently has Prostat ordered and has been refusing them. RD to order Boost Breeze instead to aid in caloric and protein needs. Pt additionally agreeable to nourishment snacks. RD to order. Pt encouraged to eat her  food at meals.   Labs and medications reviewed.   Diet Order:  Diet renal with fluid restriction Fluid restriction: 1200 mL Fluid; Room service appropriate? Yes; Fluid consistency: Thin  Skin:  Wound (see comment) (wound on back)  Last BM:  3/15  Height:   Ht Readings from Last 1 Encounters:  07/22/16 5\' 3"  (1.6 m)    Weight:   Wt Readings from Last 1 Encounters:  07/29/16 129 lb 10.1 oz (58.8 kg)    Ideal Body Weight:  52.27 kg  BMI:  Body mass index is 22.96 kg/m.  Estimated Nutritional Needs:   Kcal:  1700-1900  Protein:  75-85 grams  Fluid:  1.2 L/day  EDUCATION NEEDS:   Education needs addressed  Corrin Parker, MS, RD, LDN Pager # [phone] After hours/ weekend pager # [phone]

## 2016-07-29 NOTE — Procedures (Signed)
Patient was seen on plasmapheresis and the procedure was supervised.  BFR 200  Via PC BP is  136/53.   Patient appears to be tolerating treatment well  [name] A 07/29/2016

## 2016-07-29 NOTE — Progress Notes (Signed)
Accepted at [facility] Start Date and Time Tuesday March 27th at  11:45 am .chairtime 12:45.Tentative dialysis schedule Tuesday.Thursday.Saturday.

## 2016-07-29 NOTE — Care Management Important Message (Signed)
Important Message  Patient Details  Name: [name] MRN: [id number] Date of Birth: 1946/02/28   Medicare Important Message Given:  Yes    Orbie Pyo 07/29/2016, 10:35 AM

## 2016-07-29 NOTE — Progress Notes (Signed)
Patient ID: [name], female   DOB: 09/28/1945, 71 y.o.   MRN: [id number] [facility] Progress Note   Assessment/ Plan:   1. 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  on alternate day plasmapheresis GBM down from 125 to 33 at last check  Cyclophosphamide initiation was delayed I guess to be based on the kidney biopsy- wondered if she  need it based on her upper resp symptoms and now that biopsy- start at 100 daily oral on 3/20. At this time, she was anuric but  maybe making urine - not showing meaningful signs of renal recovery  continue dialysis on a TTS schedule-plan for next on tomorrow working on CLIP.  Want to wait a little before perm access 2. Hypertension:  blood pressures improved with resumption of antihypertensive therapy/dialysis. 3. Anemia: Likely anemia of chronic disease with recent hospitalizations/vasculitis. She has low iron saturation with prohibitively high ferritin for intravenous iron bolusing. Now on Aranesp with hemodialysis. Transfused 3/20 one unit- may need blood tomorrow with HD 4. Hyperphosphatemia: phos OK no binder-  PTH 109, continue to monitor- no vit D. 5. Upper respiratory symptoms- she says better with only steroids and plasmapheresis.  Starting cytoxan - appreciate  pulm opinion on immune drugs only because I am not sure how long will be fruitful if no recovery of renal funciton   Subjective:   She looks good- seen on TPE- said made a little urine- thinks is increasing- still working on BM   Objective:   BP (!) 151/61   Pulse 70   Temp 98.1 F (36.7 C) (Oral)   Resp 20   Ht 5\' 3"  (1.6 m)   Wt 58.9 kg (129 lb 13.6 oz)   SpO2 97%   BMI 23.00 kg/m   Intake/Output Summary (Last 24 hours) at 07/29/16 0802 Last data  filed at 07/29/16 0606  Gross per 24 hour  Intake              480 ml  Output             2001 ml  Net            -1521 ml   Weight change: -0.6 kg (-1 lb 5.2 oz)  Physical Exam: Gen: Comfortably resting in bed Right IJ PC CVS: Pulse regular rhythm, normal S1 and S2 Resp: Clear to auscultation, no rales/rhonchi Abd: Soft, flat, nontender Ext: No lower extremity edema  Imaging: No results found.  Labs: BMET  Recent Labs Lab 07/25/16 1003  07/25/16 1851 07/26/16 0520 07/27/16 0730 07/27/16 1600 07/27/16 1718 07/28/16 0540 07/29/16 0332  NA 134*  < > 135 135 137 134* 137 138 136  K 3.8  < > 4.3 4.1 3.9 4.3 3.9 3.9 3.7  CL 100*  < > 106 104 100* 100* 96* 103 98*  CO2 21*  --  19* 20* 29 23  --  25 27  GLUCOSE 157*  < > 160* 141* 117* 154* 156* 116* 112*  BUN 39*  < > 42* 48* 22* 26* 29* 31* 12  CREATININE 4.61*  < > 4.64* 5.20* 3.27* 3.71* 3.60* 4.20* 2.77*  CALCIUM 8.8*  --  8.5* 8.4* 8.2* 8.2*  --  8.1* 7.9*  PHOS  --   --  3.7 4.1 2.7 3.1  --  3.1 3.3  < > = values in this interval not displayed. CBC  Recent Labs Lab 07/27/16 0730 07/27/16 1600 07/27/16 1718 07/28/16 0540 07/29/16 0332  WBC 10.2 12.5*  --  12.0* 10.3  HGB 8.5* 9.5* 9.2* 8.2* 7.8*  HCT 25.0* 28.8* 27.0* 24.1* 23.1*  MCV 82.8 84.0  --  83.1 83.4  PLT 136* 167  --  159 147*   Medications:    . sodium chloride   Intravenous Once  . therapeutic plasma exchange solution   Dialysis Q1 Hr x 3  . amLODipine  5 mg Oral Daily  . aspirin EC  81 mg Oral Daily  . atorvastatin  10 mg Oral q1800  . calcium carbonate      . calcium carbonate  2 tablet Oral Q3H  . calcium gluconate IVPB  4 g Intravenous Once  . citrate dextrose      . cyclophosphamide  100 mg Oral Daily  . [START ON 07/30/2016] darbepoetin (ARANESP) injection - DIALYSIS  150 mcg Intravenous Q Sat-HD  . feeding supplement (PRO-STAT SUGAR FREE 64)  30 mL Oral BID  . fluticasone  2 spray Each Nare QHS  . heparin  1,000 Units  Intracatheter Once  . loratadine  10 mg Oral Daily  . pantoprazole  40 mg Oral Daily  . predniSONE  60 mg Oral Q breakfast  . sodium chloride  2 spray Each Nare TID  . sodium chloride flush  3 mL Intravenous Q12H  . sulfamethoxazole-trimethoprim  1 tablet Oral Once per day on Mon Wed Fri   Verner Mccrone A  07/29/2016, 8:02 AM

## 2016-07-29 NOTE — Progress Notes (Signed)
Patient ID: [name], female   DOB: 1945-12-12, 71 y.o.   MRN: [id number]  PROGRESS NOTE  AISHI COURTS  FKC:[id number] DOB: 1945-08-01 DOA: 07/20/2016  PCP: Merrilee Seashore, MD  Brief Narrative:  71 y.o. female with medical history significant for COPD, esophageal stricture, GERD, hiatal hernia, hyperlipidemia, hypertension, IBS, cervical disc disease and history of lung nodules with extensive workup including lung biopsy ruling out Sjogren's disease. Pt has had recurrent history of sinusitis since last fall, treated with several rounds of antibiotics and corticosteroids which have not resulted in significant clinical improvement. She was hospitalized 2 months ago for fever/weight loss and relative hypotension with anorexia and night sweats and was treated for community-acquired pneumonia. She has continued to have sinus drainage with some epistaxis and bloody secretions and also had nausea with dysgeusia for the past few weeks or so. Pt also reported severe muscle pain and leg cramps over the same time period. Of note, pt is followed by Dr. Elsworth Soho for abnormal CT imaging & pulmonary nodules.  Work up for this has included an open lung biopsy in 11/2008 in the setting of fever/night sweats and CT scan with pulmonary nodules which showed possible inflammatory pseudotumor vs reaction to old bronchopneumonia associated with some carcinoid tumorlets. Salivary  gland biospy 03/2009 was negative for Sjogren's disease and negative for autoimmune work up.  Serial CT's were planned but she was lost to follow up. She had a repeat CT of the chest in 06/2016 which showed volume loss, post surgical changes of the left hemithorax and RLL pulmonary nodules.   In regards to pt's creatinine, in January of 2018 was 1.7 and in February 2018 it ranged between 1.6 and 2.1 with a peak of 2.8 that prompted referral to nephrology. After evaluation 07/20/16, she was found to have an elevated creatinine level of 12.4 and  given the high risk of suspicion for vasculitis pt was directly admitted to hospital for evaluation.  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 She was on solumedrol and then switched to PO prednisone. She is required to have plasmapheresis on alternate days for 2 weeks (started 07/22/2016).  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 Renal started on Cyclophosphamide 100 mg PO QD.  - She is on HD TTS - Hep B negative. ESR and CRP elevated  - working on [employer].  Want to wait a little before perm access   Essential hypertension - Continue Norvasc  - SBP in 150's this AM   Anemia of chronic renal disease - Ferritin 1163, iron 25, TIBC 133 - consistent with anemia of chronic disease - Continue aranesp on Saturday with HD - Hg is down from yesterday from 9.2 --> 8.2 --> 7.8 --> 7.5 - CBC in AM  Hyperphosphatemia - Low phosphorus diet, monitor with hemodialysis - PTH level 109  Thrombocytopenia - mild, monitor -  CBC in AM  Dyslipidemia - Continue Lipitor   RLL pulmonary nodules - Appreciate pulmonary input; consulted by renal to assist with timing of re-imaging in the setting of abnormal CT  - Pulm recommended prophylactic bactrim while on immunosuppresion - Repeat CT imaging on Friday 3/23 and agin in two months for  review.  If comparative CT in 2 months unchanged and no evidence of renal recover, doubt that there is benefit of continued therapy - Monitor for hemoptysis - Follow up with Dr. Elsworth Soho at discharge > had prior PET arranged for May 2018 to review pulmonary nodules  - Post treatment imaging will guide PCCM regarding if nodules are a separate process vs related and if biopsy is needed  DVT prophylaxis: SCD's bilaterally  Code Status: full code  Family Communication: no family at the bedside this am Disposition Plan: home once cleared by renal    Consultants:   Nephrology  IR  Pulmonary 07/27/16  Procedures:   RIJ cathter for HD placed 07/20/2016  Renal biopsy 07/21/2016  Plasmapheresis 07/22/16  HD TTS  Cytoxan - started 07/27/16  Antimicrobials:   None   Subjective: No overnight events.   Objective: Vitals:   07/29/16 0746 07/29/16 0800 07/29/16 0809 07/29/16 0825  BP: (!) 151/61 136/63 (!) 142/65 (!) 150/59  Pulse: 70 70 69 69  Resp: _0 Temp: 98.1 F (36.7 C)  98.3 F (36.8 C) 98.7 F (37.1 C)  TempSrc: Oral   Oral  SpO2:    98%  Weight:    58.8 kg (129 lb 10.1 oz)  Height:        Intake/Output Summary (Last 24 hours) at 07/29/16 1244 Last data filed at 07/29/16 0606  Gross per 24 hour  Intake              480 ml  Output                1 ml  Net              479 ml   Filed Weights   07/28/16 1900 07/29/16 0715 07/29/16 0825  Weight: 58.2 kg (128 lb 4.9 oz) 58.9 kg (129 lb 13.6 oz) 58.8 kg (129 lb 10.1 oz)    Examination:  General exam: no acute distress  Respiratory system: No wheezing, no rhonchi Cardiovascular system: S1 & S2 heard, has right IJ tunneled cathter placed Gastrointestinal system: (+) BS, no distention  Central nervous system: Non focal  Extremities: palpable pulses bilaterally, no swelling  Skin: no ulcers or lesions  Psychiatry: Normal mood and affect   Data Reviewed: I have personally reviewed following labs and imaging  studies  CBC:  Recent Labs Lab 07/26/16 0520 07/27/16 0730 07/27/16 1600 07/27/16 1718 07/28/16 0540 07/29/16 0332 07/29/16 0724  WBC 7.4 10.2 12.5*  --  12.0* 10.3  --   HGB 7.2* 8.5* 9.5* 9.2* 8.2* 7.8* 7.5*  HCT 21.4* 25.0* 28.8* 27.0* 24.1* 23.1* 22.0*  MCV 82.9 82.8 84.0  --  83.1 83.4  --   PLT 139* 136* 167  --  159 147*  --    Basic Metabolic Panel:  Recent Labs Lab 07/26/16 0520 07/27/16 0730 07/27/16 1600 07/27/16 1718 07/28/16 0540 07/29/16 0332 07/29/16 0724  NA 135 137 134* 137 138 136 136  K 4.1 3.9 4.3 3.9 3.9 3.7 3.5  CL 104 100* 100* 96* 103 98* 95*  CO2 20* 29 23  --  25 27  --  GLUCOSE 141* 117* 154* 156* 116* 112* 102*  BUN 48* 22* 26* 29* 31* 12 18  CREATININE 5.20* 3.27* 3.71* 3.60* 4.20* 2.77* 3.00*  CALCIUM 8.4* 8.2* 8.2*  --  8.1* 7.9*  --   PHOS 4.1 2.7 3.1  --  3.1 3.3  --    Liver Function Tests:  Recent Labs Lab 07/26/16 0520 07/27/16 0730 07/27/16 1600 07/28/16 0540 07/29/16 0332  ALBUMIN 3.5 3.3* 3.6 3.7 3.2*   Urine analysis:    Component Value Date/Time   COLORURINE STRAW (A) 06/20/2016 0129   APPEARANCEUR CLEAR 06/20/2016 0129   LABSPEC 1.008 06/20/2016 0129   PHURINE 7.0 06/20/2016 0129   GLUCOSEU NEGATIVE 06/20/2016 0129   GLUCOSEU NEGATIVE 06/03/2014 1038   HGBUR MODERATE (A) 06/20/2016 0129   BILIRUBINUR NEGATIVE 06/20/2016 0129   KETONESUR NEGATIVE 06/20/2016 0129   PROTEINUR NEGATIVE 06/20/2016 0129   UROBILINOGEN 0.2 06/03/2014 1038   NITRITE NEGATIVE 06/20/2016 0129   LEUKOCYTESUR TRACE (A) 06/20/2016 0129    Recent Results (from the past 240 hour(s))  MRSA PCR Screening     Status: None   Collection Time: 07/21/16  9:06 AM  Result Value Ref Range Status   MRSA by PCR NEGATIVE NEGATIVE Final    Radiology Studies: Ir Fluoro Guide Cv Line Right Result Date: 07/20/2016 Successful placement of 19 cm tip to cuff tunneled hemodialysis catheter via the right internal jugular vein with tips terminating  within the superior aspect of the right atrium. The catheter is ready for immediate use.  Ir US Guide Vasc Access Right Result Date: 07/20/2016 Successful placement of 19 cm tip to cuff tunneled hemodialysis catheter via the right internal jugular vein with tips terminating within the superior aspect of the right atrium. The catheter is ready for immediate use.  Ct Maxillofacial Wo Contrast Result Date: 07/20/2016 Normally aerated paranasal sinuses and patent sinus drainage pathways.   Scheduled Meds: . sodium chloride   Intravenous Once  . amLODipine  5 mg Oral Daily  . aspirin EC  81 mg Oral Daily  . atorvastatin  10 mg Oral q1800  . citrate dextrose      . cyclophosphamide  100 mg Oral Daily  . [START ON 07/30/2016] darbepoetin (ARANESP) injection - DIALYSIS  150 mcg Intravenous Q Sat-HD  . feeding supplement (PRO-STAT SUGAR FREE 64)  30 mL Oral BID  . fluticasone  2 spray Each Nare QHS  . heparin  1,000 Units Intracatheter Once  . loratadine  10 mg Oral Daily  . pantoprazole  40 mg Oral Daily  . predniSONE  60 mg Oral Q breakfast  . sodium chloride  2 spray Each Nare TID  . sodium chloride flush  3 mL Intravenous Q12H  . sulfamethoxazole-trimethoprim  1 tablet Oral Once per day on Mon Wed Fri   Continuous Infusions: . citrate dextrose       LOS: 9 days   Time spent: 25 minutes  Greater than 50% of the time spent on counseling and coordinating the care.  Faye Ramsay, MD [facility] Pager [phone]  If 7PM-7AM, please contact night-coverage [email or web address] Password [facility] 07/29/2016, 12:44 PM

## 2016-07-29 NOTE — Telephone Encounter (Signed)
Renal biopsy has been scheduled by Dr. Lorrene Reid She had proteinuria and hematuria with c-ANCA +1 :160 and p-ANCA 1: 80 She does have mild sinus disease and pulmonary nodules We will defer PET scan for now and await results of biopsy Prior biopsy of lung nodule has only showed inflammatory etiology

## 2016-07-30 DIAGNOSIS — N170 Acute kidney failure with tubular necrosis: Secondary | ICD-10-CM

## 2016-07-30 DIAGNOSIS — K5900 Constipation, unspecified: Secondary | ICD-10-CM

## 2016-07-30 LAB — RENAL FUNCTION PANEL
ANION GAP: 11 (ref 5–15)
Albumin: 3.7 g/dL (ref 3.5–5.0)
BUN: 30 mg/dL — ABNORMAL HIGH (ref 6–20)
CALCIUM: 8.1 mg/dL — AB (ref 8.9–10.3)
CO2: 22 mmol/L (ref 22–32)
CREATININE: 4.42 mg/dL — AB (ref 0.44–1.00)
Chloride: 104 mmol/L (ref 101–111)
GFR, EST AFRICAN AMERICAN: 11 mL/min — AB (ref >60)
GFR, EST NON AFRICAN AMERICAN: 9 mL/min — AB (ref >60)
Glucose, Bld: 120 mg/dL — ABNORMAL HIGH (ref 65–99)
PHOSPHORUS: 3.6 mg/dL (ref 2.5–4.6)
Potassium: 3.5 mmol/L (ref 3.5–5.1)
Sodium: 137 mmol/L (ref 135–145)

## 2016-07-30 LAB — CBC
HCT: 22.1 % — ABNORMAL LOW (ref 36.0–46.0)
Hemoglobin: 7.5 g/dL — ABNORMAL LOW (ref 12.0–15.0)
MCH: 28.2 pg (ref 26.0–34.0)
MCHC: 33.9 g/dL (ref 30.0–36.0)
MCV: 83.1 fL (ref 78.0–100.0)
Platelets: 150 K/uL (ref 150–400)
RBC: 2.66 MIL/uL — ABNORMAL LOW (ref 3.87–5.11)
RDW: 16.3 % — ABNORMAL HIGH (ref 11.5–15.5)
WBC: 12 K/uL — ABNORMAL HIGH (ref 4.0–10.5)

## 2016-07-30 LAB — PREPARE RBC (CROSSMATCH)

## 2016-07-30 MED ORDER — PENTAFLUOROPROP-TETRAFLUOROETH EX AERO: 1.0000 "application " | INHALATION_SPRAY | CUTANEOUS | Status: DC | PRN

## 2016-07-30 MED ORDER — POLYETHYLENE GLYCOL 3350 17 G PO PACK
17.0000 g | PACK | Freq: Every day | ORAL | Status: DC
  Administered 2016-07-30 – 2016-08-09 (×4): 17 g via ORAL
  Filled 2016-07-30 (×12): qty 1

## 2016-07-30 MED ORDER — ALTEPLASE 2 MG IJ SOLR: 2.0000 mg | Freq: Once | INTRAMUSCULAR | Status: DC | PRN

## 2016-07-30 MED ORDER — DARBEPOETIN ALFA 150 MCG/0.3ML IJ SOSY
PREFILLED_SYRINGE | INTRAMUSCULAR | Status: AC
  Administered 2016-07-30: 150 ug via INTRAVENOUS
  Filled 2016-07-30: qty 0.3

## 2016-07-30 MED ORDER — BISACODYL 10 MG RE SUPP
10.0000 mg | Freq: Every day | RECTAL | Status: DC
  Administered 2016-07-30 – 2016-08-06 (×2): 10 mg via RECTAL
  Filled 2016-07-30 (×6): qty 1

## 2016-07-30 MED ORDER — HEPARIN SODIUM (PORCINE) 1000 UNIT/ML DIALYSIS: 20.0000 [IU]/kg | INTRAMUSCULAR | Status: DC | PRN

## 2016-07-30 MED ORDER — LIDOCAINE HCL (PF) 1 % IJ SOLN
5.0000 mL | INTRAMUSCULAR | Status: DC | PRN
Start: 2016-07-30 — End: 2016-07-30

## 2016-07-30 MED ORDER — SODIUM CHLORIDE 0.9 % IV SOLN: 100.0000 mL | INTRAVENOUS | Status: DC | PRN

## 2016-07-30 MED ORDER — LIDOCAINE-PRILOCAINE 2.5-2.5 % EX CREA
1.0000 | TOPICAL_CREAM | CUTANEOUS | Status: DC | PRN
Start: 2016-07-30 — End: 2016-07-30

## 2016-07-30 MED ORDER — HEPARIN SODIUM (PORCINE) 1000 UNIT/ML DIALYSIS: 1000.0000 [IU] | INTRAMUSCULAR | Status: DC | PRN

## 2016-07-30 MED ORDER — SODIUM CHLORIDE 0.9 % IV SOLN
Freq: Once | INTRAVENOUS | Status: DC
Start: 2016-07-30 — End: 2016-08-14

## 2016-07-30 NOTE — Progress Notes (Signed)
PROGRESS NOTE                                                                                                                                                                                                             Patient Demographics:    [name], is a 71 y.o. female, DOB - 1946/04/06, WUG:[id number]  Admit date - 07/20/2016   Admitting Physician Waldemar Dickens, MD  Outpatient Primary MD for the patient is [facility], MD  LOS - 10  Outpatient Specialists: NONE  No chief complaint on file.      Brief Narrative   71 y.o.femalewith medical history significant for COPD, esophageal stricture, GERD, hiatal hernia, hyperlipidemia, hypertension, IBS, cervical disc disease and history of lung nodules with extensive workup including lung biopsy ruling out Sjogren's disease. Pt has had recurrent history of sinusitis since last fall, treated with several rounds of antibiotics and corticosteroids which have not resulted in significant clinical improvement. She was hospitalized 2 months ago for fever/weight loss and relative hypotension with anorexia and night sweats and was treated for community-acquired pneumonia. She has continued to have sinus drainage with some epistaxis and bloody secretions and also had nausea with dysgeusia for the past few weeks or so. Pt also reported severe muscle pain and leg cramps over the same time period. Of note, pt is followed by Dr. Elsworth Soho for abnormal CT imaging &pulmonary nodules. Work up for this has included an open lung biopsy in 11/2008 in the setting of fever/night sweats and CT scan with pulmonary nodules which showed possible inflammatory pseudotumor vs reaction to old bronchopneumonia associated with some carcinoid tumorlets. Salivary  gland biospy 03/2009 was negative for Sjogren's disease and negative for autoimmune work up. Serial CT's were planned but she was lost to follow  up. She had a repeat CT of the chest in 06/2016 which showed volume loss, post surgical changes of the left hemithorax and RLL pulmonary nodules.   In regards to pt's creatinine, in January of 2018 was 1.7 and in February 2018 it ranged between 1.6 and 2.1 with a peak of 2.8 that prompted referral to nephrology. After evaluation 07/20/16, she was found to have an elevated creatinine level of 12.4 and given the high risk of suspicion for vasculitis pt was directly admitted to hospital for evaluation.  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 She was on solumedrol and then switched to PO prednisone. She is required to have plasmapheresis on alternate days for 2 weeks (started 07/22/2016).   Subjective:   Reports feeling constipated for past several days. No other overnight issues.   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 Renal started on Cyclophosphamide 100 mg PO QD.  - She is on HD TTS. -check repeat anti GBM to see titers. - As outpatient hemodialysis placement. Want to wait a little before perm access .  Essential hypertension - Continue Norvasc  -Improved.  Anemia of chronic renal disease -Aranesp when necessary with dialysis. Noted for some drop in hemoglobin. Monitor.  Hyperphosphatemia - Low phosphorus diet, monitor  with hemodialysis - PTH level 109  Thrombocytopenia Mild. Monitor  Dyslipidemia - Continue Lipitor   RLL pulmonary nodules - Pulm recommended prophylactic bactrim while on immunosuppresion - Repeat CT imaging done this admission shows unchanged pulmonary nodule. If comparative CT in 2 months unchanged and no evidence of renal recover, doubt that there is benefit of continued therapy. - Monitor for hemoptysis - Follow up with Dr. Elsworth Soho at discharge >had prior PET arranged for May 2018 to review pulmonary nodules    Constipation Order Dulcolax suppository and MiraLAX.    Code Status : Full code  Family Communication  : None at bedside  Disposition Plan  : Home once cleared by nephrology  Barriers For Discharge : Active symptoms  Consults  :   Nephrology Pulmonary IR    Procedures    RIJ cathter for HD placed 07/20/2016  Renal biopsy 07/21/2016  Plasmapheresis 07/22/16  HD TTS  Cytoxan - started 07/27/16  DVT Prophylaxis  :  SCDs  Lab Results  Component Value Date   PLT 150 07/30/2016    Antibiotics  :    Anti-infectives    Start     Dose/Rate Route Frequency Ordered Stop   07/27/16 0900  sulfamethoxazole-trimethoprim (BACTRIM DS,SEPTRA DS) 800-160 MG per tablet 1 tablet     1 tablet Oral Once per day on Mon Wed Fri 07/26/16 1639     07/20/16 1430  ceFAZolin (ANCEF) IVPB 2g/100 mL premix     2 g 200 mL/hr over 30 Minutes Intravenous To Radiology 07/20/16 1424 07/20/16 1641        Objective:   Vitals:   07/29/16 0825 07/29/16 2321 07/30/16 0643 07/30/16 0945  BP: (!) 150/59 (!) 133/56 138/64 139/65  Pulse: 69 65 70 64  Resp:  '16 15 16  ' Temp: 98.7 F (37.1 C) 98.3 F (36.8 C) 99.1 F (37.3 C) 98.5 F (36.9 C)  TempSrc: Oral Oral Oral Oral  SpO2: 98% 100% 100% 100%  Weight: 58.8 kg (129 lb 10.1 oz) 59.2 kg (130 lb 8 oz)    Height:        Wt Readings from Last 3 Encounters:  07/29/16 59.2 kg (130 lb 8 oz)  06/29/16 65.8 kg (145 lb)    06/19/16 64 kg (141 lb)     Intake/Output Summary (Last 24 hours) at 07/30/16 1414 Last data filed at 07/30/16 1409  Gross per 24 hour  Intake              240 ml  Output                0 ml  Net              240 ml     Physical Exam  Gen: not in distress HEENT: moist mucosa, supple neck, rt temp HD catheter Chest: clear b/l, no added sounds CVS: N S1&S2, no murmurs,  GI: soft, NT, ND, Musculoskeletal: warm, no edema     Data Review:    CBC  Recent Labs Lab 07/27/16 0730 07/27/16 1600 07/27/16 1718 07/28/16 0540 07/29/16 0332 07/29/16 0724 07/30/16 0604  WBC 10.2 12.5*  --  12.0* 10.3  --  12.0*  HGB 8.5* 9.5* 9.2* 8.2* 7.8* 7.5* 7.5*  HCT 25.0* 28.8* 27.0* 24.1* 23.1* 22.0* 22.1*  PLT 136* 167  --  159 147*  --  150  MCV 82.8 84.0  --  83.1 83.4  --  83.1  MCH 28.1 27.7  --  28.3 28.2  --  28.2  MCHC 34.0 33.0  --  34.0 33.8  --  33.9  RDW 15.8* 16.2*  --  16.1* 16.0*  --  16.3*    Chemistries   Recent Labs Lab 07/27/16 0730 07/27/16 1600 07/27/16 1718 07/28/16 0540 07/29/16 0332 07/29/16 0724 07/30/16 0604  NA 137 134* 137 138 136 136 137  K 3.9 4.3 3.9 3.9 3.7 3.5 3.5  CL 100* 100* 96* 103 98* 95* 104  CO2 29 23  --  25 27  --  22  GLUCOSE 117* 154* 156* 116* 112* 102* 120*  BUN 22* 26* 29* 31* 12 18 30*  CREATININE 3.27* 3.71* 3.60* 4.20* 2.77* 3.00* 4.42*  CALCIUM 8.2* 8.2*  --  8.1* 7.9*  --  8.1*   ------------------------------------------------------------------------------------------------------------------ No results for input(s): CHOL, HDL, LDLCALC, TRIG, CHOLHDL, LDLDIRECT in the last 72 hours.  Lab Results  Component Value Date   HGBA1C 5.8 02/02/2010   ------------------------------------------------------------------------------------------------------------------ No results for input(s): TSH, T4TOTAL, T3FREE, THYROIDAB in the last 72 hours.  Invalid input(s):  FREET3 ------------------------------------------------------------------------------------------------------------------ No results for input(s): VITAMINB12, FOLATE, FERRITIN, TIBC, IRON, RETICCTPCT in the last 72 hours.  Coagulation profile No results for input(s): INR, PROTIME in the last 168 hours.  No results for input(s): DDIMER in the last 72 hours.  Cardiac Enzymes No results for input(s): CKMB, TROPONINI, MYOGLOBIN in the last 168 hours.  Invalid input(s): CK ------------------------------------------------------------------------------------------------------------------ No results found for: BNP  Inpatient Medications  Scheduled Meds: . sodium chloride   Intravenous Once  . sodium chloride   Intravenous Once  . amLODipine  5 mg Oral Daily  . aspirin EC  81 mg Oral Daily  . atorvastatin  10 mg Oral q1800  . cyclophosphamide  100 mg Oral Daily  . darbepoetin (ARANESP) injection - DIALYSIS  150 mcg Intravenous Q Sat-HD  . feeding supplement  1 Container Oral BID BM  . fluticasone  2 spray Each Nare QHS  . heparin  1,000 Units Intracatheter Once  . loratadine  10 mg Oral Daily  . pantoprazole  40 mg Oral Daily  . predniSONE  60 mg Oral Q breakfast  .  sodium chloride  2 spray Each Nare TID  . sodium chloride flush  3 mL Intravenous Q12H  . sulfamethoxazole-trimethoprim  1 tablet Oral Once per day on Mon Wed Fri   Continuous Infusions: . citrate dextrose     PRN Meds:.sodium chloride, acetaminophen, calcium carbonate, diphenhydrAMINE, methocarbamol (ROBAXIN)  IV, ondansetron **OR** ondansetron (ZOFRAN) IV, senna, sodium chloride flush  Micro Results Recent Results (from the past 240 hour(s))  MRSA PCR Screening     Status: None   Collection Time: 07/21/16  9:06 AM  Result Value Ref Range Status   MRSA by PCR NEGATIVE NEGATIVE Final    Comment:        The GeneXpert MRSA Assay (FDA approved for NASAL specimens only), is one component of a comprehensive MRSA  colonization surveillance program. It is not intended to diagnose MRSA infection nor to guide or monitor treatment for MRSA infections.     Radiology Reports Ct Chest Wo Contrast  Result Date: 07/29/2016 CLINICAL DATA:  71 year old female inpatient admitted for 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 Longstanding history of indeterminate pulmonary nodules, described as inflammatory pseudotumors on surgical lung biopsy of the left lung on 11/06/2008. EXAM: CT CHEST WITHOUT CONTRAST TECHNIQUE: Multidetector CT imaging of the chest was performed following the standard protocol without IV contrast. COMPARISON:  06/20/2016 chest CT.  07/26/2016 chest radiograph. FINDINGS: Cardiovascular: Top-normal heart size . No significant pericardial fluid/thickening. Right internal jugular dialysis catheter terminates at the cavoatrial junction. Atherosclerotic nonaneurysmal thoracic aorta. Stable dilated pulmonary arteries (main pulmonary artery diameter 3.4 cm). Mediastinum/Nodes: Stable heterogeneous thyroid gland with possible confluent small hypodense thyroid nodules. 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 No pathologically enlarged axillary, mediastinal or gross hilar lymph nodes, noting limited sensitivity for the detection of hilar adenopathy on this noncontrast study. Lungs/Pleura: No pneumothorax. New trace dependent bilateral pleural effusions. Stable postsurgical changes from wedge resection in the left upper and left lower lobes. Mild centrilobular and paraseptal emphysema with mild diffuse bronchial wall thickening. No acute consolidative airspace disease or new significant pulmonary nodules. Stable parenchymal band in the posterior right upper lobe with associated distortion, compatible with postinfectious/postinflammatory scarring. Stable chronic thickening of the  peribronchovascular interstitium throughout both lungs with associated chronic mild distortion. Scattered irregular pulmonary nodules in the right lung are unchanged in the interval and not convincingly changed back to 12/04/2009 chest CT, compatible with benign inflammatory nodules. For example a 1.5 x 1.0 cm peripheral right upper lobe nodule (series 4/ image 41), measuring 1.5 x 1.0 cm on 06/20/2016 and 1.5 x 0.9 cm on 12/04/2009. Partially cystic irregular right lower lobe 2.2 x 1.7 cm nodule, measuring 2.3 x 1.8 cm on 06/20/2016 and 2.3 x 1.9 cm on 12/04/2009. Upper abdomen: Unremarkable. Musculoskeletal: No aggressive appearing focal osseous lesions. Mild thoracic spondylosis. New mild anasarca. IMPRESSION: 1. New mild anasarca and trace dependent bilateral pleural effusions. Top-normal heart size. 2. Scattered irregular pulmonary nodules in the right lung are stable in the interval and not convincingly changed back to 2011, compatible with benign inflammatory nodules given the results of the 2010 surgical left lung biopsy. 3. Stable postsurgical and postinflammatory scarring in both lungs. No acute pulmonary disease. 4. Mild emphysema with mild diffuse bronchial wall thickening, suggesting COPD. 5. Aortic atherosclerosis. 6. Stable dilated main pulmonary artery, suggesting pulmonary arterial hypertension. Electronically Signed   By: Ilona Sorrel M.D.   On:  07/29/2016 15:48   US Biopsy  Result Date: 07/21/2016 INDICATION: Acute renal failure EXAM: ULTRASOUND-GUIDED BIOPSY OF THE RENAL CORTEX.  RANDOM CORE. MEDICATIONS: None. ANESTHESIA/SEDATION: Fentanyl 50 mcg IV; Versed 1 mg IV Moderate Sedation Time:  10 The patient was continuously monitored during the procedure by the interventional radiology nurse under my direct supervision. FLUOROSCOPY TIME:  None. COMPLICATIONS: None immediate. PROCEDURE: Informed written consent was obtained from the patient after a thorough discussion of the procedural risks,  benefits and alternatives. All questions were addressed. Maximal Sterile Barrier Technique was utilized including caps, mask, sterile gowns, sterile gloves, sterile drape, hand hygiene and skin antiseptic. A timeout was performed prior to the initiation of the procedure. The back was prepped with ChloraPrep in a sterile fashion, and a sterile drape was applied covering the operative field. A sterile gown and sterile gloves were used for the procedure. Under sonographic guidance, 2 16 gauge core biopsies of the cortex of the lower pole of the left kidney were obtained. Final imaging was performed. Patient tolerated the procedure well without complication. Vital sign monitoring by nursing staff during the procedure will continue as patient is in the special procedures unit for post procedure observation. FINDINGS: The images document guide needle placement within the left lower pole renal cortex. Post biopsy images demonstrate no evidence of hemorrhage. IMPRESSION: Successful ultrasound-guided random renal cortex core biopsy. Electronically Signed   By: Marybelle Killings M.D.   On: 07/21/2016 14:36   Ir Fluoro Guide Cv Line Right  Result Date: 07/20/2016 INDICATION: End-stage renal disease. In need of durable intravenous access for the initiation of dialysis. EXAM: TUNNELED CENTRAL VENOUS HEMODIALYSIS CATHETER PLACEMENT WITH ULTRASOUND AND FLUOROSCOPIC GUIDANCE MEDICATIONS: Ancef 2 gm IV . The antibiotic was given in an appropriate time interval prior to skin puncture. ANESTHESIA/SEDATION: Versed 1 mg IV; Fentanyl 50 mcg IV; Moderate Sedation Time:  17 minutes The patient was continuously monitored during the procedure by the interventional radiology nurse under my direct supervision. FLUOROSCOPY TIME:  Fluoroscopy Time: 12 seconds (2 mGy). COMPLICATIONS: None immediate. PROCEDURE: Informed written consent was obtained from the patient after a discussion of the risks, benefits, and alternatives to treatment. Questions  regarding the procedure were encouraged and answered. The right neck and chest were prepped with chlorhexidine in a sterile fashion, and a sterile drape was applied covering the operative field. Maximum barrier sterile technique with sterile gowns and gloves were used for the procedure. A timeout was performed prior to the initiation of the procedure. After creating a small venotomy incision, a micropuncture kit was utilized to access the right internal jugular vein under direct, real-time ultrasound guidance after the overlying soft tissues were anesthetized with 1% lidocaine with epinephrine. Ultrasound image documentation was performed. The microwire was kinked to measure appropriate catheter length. A stiff Glidewire was advanced to the level of the IVC and the micropuncture sheath was exchanged for a peel-away sheath. A Palindrome tunneled hemodialysis catheter measuring 19 cm from tip to cuff was tunneled in a retrograde fashion from the anterior chest wall to the venotomy incision. The catheter was then placed through the peel-away sheath with tips ultimately positioned within the superior aspect of the right atrium. Final catheter positioning was confirmed and documented with a spot radiographic image. The catheter aspirates and flushes normally. The catheter was flushed with appropriate volume heparin dwells. The catheter exit site was secured with a 0-Prolene retention suture. The venotomy incision was closed with an interrupted 4-0 Vicryl, Dermabond and Steri-strips. Dressings were  applied. The patient tolerated the procedure well without immediate post procedural complication. IMPRESSION: Successful placement of 19 cm tip to cuff tunneled hemodialysis catheter via the right internal jugular vein with tips terminating within the superior aspect of the right atrium. The catheter is ready for immediate use. Electronically Signed   By: [name] M.D.   On: 07/20/2016 17:12   Ir US Guide Vasc Access  Right  Result Date: 07/20/2016 INDICATION: End-stage renal disease. In need of durable intravenous access for the initiation of dialysis. EXAM: TUNNELED CENTRAL VENOUS HEMODIALYSIS CATHETER PLACEMENT WITH ULTRASOUND AND FLUOROSCOPIC GUIDANCE MEDICATIONS: Ancef 2 gm IV . The antibiotic was given in an appropriate time interval prior to skin puncture. ANESTHESIA/SEDATION: Versed 1 mg IV; Fentanyl 50 mcg IV; Moderate Sedation Time:  17 minutes The patient was continuously monitored during the procedure by the interventional radiology nurse under my direct supervision. FLUOROSCOPY TIME:  Fluoroscopy Time: 12 seconds (2 mGy). COMPLICATIONS: None immediate. PROCEDURE: Informed written consent was obtained from the patient after a discussion of the risks, benefits, and alternatives to treatment. Questions regarding the procedure were encouraged and answered. The right neck and chest were prepped with chlorhexidine in a sterile fashion, and a sterile drape was applied covering the operative field. Maximum barrier sterile technique with sterile gowns and gloves were used for the procedure. A timeout was performed prior to the initiation of the procedure. After creating a small venotomy incision, a micropuncture kit was utilized to access the right internal jugular vein under direct, real-time ultrasound guidance after the overlying soft tissues were anesthetized with 1% lidocaine with epinephrine. Ultrasound image documentation was performed. The microwire was kinked to measure appropriate catheter length. A stiff Glidewire was advanced to the level of the IVC and the micropuncture sheath was exchanged for a peel-away sheath. A Palindrome tunneled hemodialysis catheter measuring 19 cm from tip to cuff was tunneled in a retrograde fashion from the anterior chest wall to the venotomy incision. The catheter was then placed through the peel-away sheath with tips ultimately positioned within the superior aspect of the right  atrium. Final catheter positioning was confirmed and documented with a spot radiographic image. The catheter aspirates and flushes normally. The catheter was flushed with appropriate volume heparin dwells. The catheter exit site was secured with a 0-Prolene retention suture. The venotomy incision was closed with an interrupted 4-0 Vicryl, Dermabond and Steri-strips. Dressings were applied. The patient tolerated the procedure well without immediate post procedural complication. IMPRESSION: Successful placement of 19 cm tip to cuff tunneled hemodialysis catheter via the right internal jugular vein with tips terminating within the superior aspect of the right atrium. The catheter is ready for immediate use. Electronically Signed   By: [name] M.D.   On: 07/20/2016 17:12   Dg Chest Port 1 View  Result Date: 07/26/2016 CLINICAL DATA:  Pulmonary nodules EXAM: PORTABLE CHEST 1 VIEW COMPARISON:  06/19/2016, chest CT 06/20/2016 FINDINGS: Right dialysis catheter is in place with the tip at the cavoatrial junction. Postoperative changes on the left. Areas of scarring and nodularity in the left lung are stable. Decreasing airspace opacities in the right lung with some residual areas of nodularity. Heart is borderline in size. No visible effusions. IMPRESSION: Stable scarring and nodularity in the left lung with postoperative changes. Improving areas of nodularity in the right lung. Electronically Signed   By: Rolm Baptise M.D.   On: 07/26/2016 16:30   Ct Maxillofacial Wo Contrast  Result Date: 07/20/2016 CLINICAL DATA:  71 y/o  F; recurrent sinusitis. EXAM: CT MAXILLOFACIAL WITHOUT CONTRAST TECHNIQUE: Multidetector CT imaging of the maxillofacial structures was performed. Multiplanar CT image reconstructions were also generated. A small metallic BB was placed on the right temple in order to reliably differentiate right from left. COMPARISON:  03/13/2016 MRI head.  12/02/2015 CT head. FINDINGS: Frontal sinus:  Normally aerated. Patent frontal sinus drainage pathways. Ethmoid sinus: Normally aerated. Maxillary sinuses:  Normally aerated. Sphenoid sinus: Normally aerated. Patent sphenoid ethmoidal recesses. Right ostiomeatal unit:  Patent. Left ostiomeatal unit:  Patent. Nasal passages: Patent. Intact nasal septum. Mild leftward nasal septal deviation and small spur. Other: Orbits and intracranial compartment are unremarkable. Mastoid air cells are normally pneumatized. IMPRESSION: Normally aerated paranasal sinuses and patent sinus drainage pathways. Electronically Signed   By: Kristine Garbe M.D.   On: 07/20/2016 14:22    Time Spent in minutes  25   Louellen Molder M.D on 07/30/2016 at 2:14 PM  Between 7am to 7pm - Pager - [phone]  After 7pm go to [email or web address] - password [facility]  [facility] -  Office  [phone]

## 2016-07-30 NOTE — Progress Notes (Signed)
Patient ID: [name], female   DOB: [birth year], 71 y.o.   MRN: [id number] [facility] Progress Note   Assessment/ Plan:   1. 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  on alternate day plasmapheresis (started 3/16- has had 7 treatments)  GBM down from 125 to 33 at last check  Cyclophosphamide initiation was delayed I guess to be based on the kidney biopsy- started at 100 daily oral on 3/20. At this time, she is oligoanuric but maybe making urine - not showing meaningful signs of renal recovery by labs  continue dialysis on a TTS schedule-plan for next today- working on CLIP- has spot TTS at [place].  Want to wait a little before perm access. Check antigbm in AM to see titer- had TPE on 3/23 2. Hypertension:  blood pressures improved with resumption of antihypertensive therapy/dialysis. 3. Anemia: Likely anemia of chronic disease with recent hospitalizations/vasculitis. She has low iron saturation but prohibitively high ferritin for intravenous iron bolusing. Now on Aranesp with hemodialysis. Transfused 3/20 one unit- will give another unit today  4. Hyperphosphatemia: phos OK no binder-  PTH 109, continue to monitor- no vit D. 5. Upper respiratory symptoms- she says better with only steroids and plasmapheresis.  Started cytoxan - appreciate  pulm opinion on immune drugs - I think more imaging ordered?   Subjective:   She looks good- - said made a little urine- thinks is increasing- still working on BM   Objective:   BP 139/65 (BP Location: Right Arm)   Pulse 64   Temp 98.5 F (36.9 C) (Oral)   Resp 16   Ht 5\' 3"  (1.6 m)   Wt 59.2 kg (130 lb 8 oz)   SpO2 100%   BMI 23.12 kg/m   Intake/Output Summary (Last 24 hours) at 07/30/16 1105 Last data filed at  07/30/16 1019  Gross per 24 hour  Intake              240 ml  Output                0 ml  Net              240 ml   Weight change: -0.7 kg (-1 lb 8.7 oz)  Physical Exam: Gen: Comfortably resting in bed Right IJ PC CVS: Pulse regular rhythm, normal S1 and S2 Resp: Clear to auscultation, no rales/rhonchi Abd: Soft, flat, nontender Ext: No lower extremity edema  Imaging: Ct Chest Wo Contrast  Result Date: 07/29/2016 CLINICAL DATA:  71 year old female inpatient admitted for 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 Longstanding history of indeterminate pulmonary nodules, described as inflammatory pseudotumors on surgical lung biopsy of the left lung on 11/06/2008. EXAM: CT CHEST WITHOUT CONTRAST TECHNIQUE: Multidetector CT imaging of the chest was performed following the standard protocol without IV contrast. COMPARISON:  06/20/2016 chest CT.  07/26/2016 chest radiograph. FINDINGS: Cardiovascular: Top-normal heart size . No significant pericardial fluid/thickening. Right internal jugular dialysis catheter terminates at the cavoatrial junction. Atherosclerotic nonaneurysmal thoracic aorta. Stable dilated pulmonary arteries (main pulmonary artery diameter 3.4 cm). Mediastinum/Nodes: Stable heterogeneous thyroid gland with possible confluent small hypodense thyroid nodules. 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 No  pathologically enlarged axillary, mediastinal or gross hilar lymph nodes, noting limited sensitivity for the detection of hilar adenopathy on this noncontrast study. Lungs/Pleura: No pneumothorax. New trace dependent bilateral pleural effusions. Stable postsurgical changes from wedge resection in the left upper and left lower lobes. Mild centrilobular and paraseptal emphysema with mild diffuse bronchial wall thickening. No acute consolidative airspace disease or new  significant pulmonary nodules. Stable parenchymal band in the posterior right upper lobe with associated distortion, compatible with postinfectious/postinflammatory scarring. Stable chronic thickening of the peribronchovascular interstitium throughout both lungs with associated chronic mild distortion. Scattered irregular pulmonary nodules in the right lung are unchanged in the interval and not convincingly changed back to 12/04/2009 chest CT, compatible with benign inflammatory nodules. For example a 1.5 x 1.0 cm peripheral right upper lobe nodule (series 4/ image 41), measuring 1.5 x 1.0 cm on 06/20/2016 and 1.5 x 0.9 cm on 12/04/2009. Partially cystic irregular right lower lobe 2.2 x 1.7 cm nodule, measuring 2.3 x 1.8 cm on 06/20/2016 and 2.3 x 1.9 cm on 12/04/2009. Upper abdomen: Unremarkable. Musculoskeletal: No aggressive appearing focal osseous lesions. Mild thoracic spondylosis. New mild anasarca. IMPRESSION: 1. New mild anasarca and trace dependent bilateral pleural effusions. Top-normal heart size. 2. Scattered irregular pulmonary nodules in the right lung are stable in the interval and not convincingly changed back to 2011, compatible with benign inflammatory nodules given the results of the 2010 surgical left lung biopsy. 3. Stable postsurgical and postinflammatory scarring in both lungs. No acute pulmonary disease. 4. Mild emphysema with mild diffuse bronchial wall thickening, suggesting COPD. 5. Aortic atherosclerosis. 6. Stable dilated main pulmonary artery, suggesting pulmonary arterial hypertension. Electronically Signed   By: Ilona Sorrel M.D.   On: 07/29/2016 15:48    Labs: BMET  Recent Labs Lab 07/25/16 1851 07/26/16 0520 07/27/16 0730 07/27/16 1600 07/27/16 1718 07/28/16 0540 07/29/16 0332 07/29/16 0724 07/30/16 0604  NA 135 135 137 134* 137 138 136 136 137  K 4.3 4.1 3.9 4.3 3.9 3.9 3.7 3.5 3.5  CL 106 104 100* 100* 96* 103 98* 95* 104  CO2 19* 20* 29 23  --  25 27  --  22   GLUCOSE 160* 141* 117* 154* 156* 116* 112* 102* 120*  BUN 42* 48* 22* 26* 29* 31* 12 18 30*  CREATININE 4.64* 5.20* 3.27* 3.71* 3.60* 4.20* 2.77* 3.00* 4.42*  CALCIUM 8.5* 8.4* 8.2* 8.2*  --  8.1* 7.9*  --  8.1*  PHOS 3.7 4.1 2.7 3.1  --  3.1 3.3  --  3.6   CBC  Recent Labs Lab 07/27/16 1600  07/28/16 0540 07/29/16 0332 07/29/16 0724 07/30/16 0604  WBC 12.5*  --  12.0* 10.3  --  12.0*  HGB 9.5*  < > 8.2* 7.8* 7.5* 7.5*  HCT 28.8*  < > 24.1* 23.1* 22.0* 22.1*  MCV 84.0  --  83.1 83.4  --  83.1  PLT 167  --  159 147*  --  150  < > = values in this interval not displayed. Medications:    . sodium chloride   Intravenous Once  . amLODipine  5 mg Oral Daily  . aspirin EC  81 mg Oral Daily  . atorvastatin  10 mg Oral q1800  . cyclophosphamide  100 mg Oral Daily  . darbepoetin (ARANESP) injection - DIALYSIS  150 mcg Intravenous Q Sat-HD  . feeding supplement  1 Container Oral BID BM  . fluticasone  2 spray Each Nare QHS  . heparin  1,000 Units Intracatheter Once  . loratadine  10 mg Oral Daily  . pantoprazole  40 mg Oral Daily  . predniSONE  60 mg Oral Q breakfast  . sodium chloride  2 spray Each Nare TID  . sodium chloride flush  3 mL Intravenous Q12H  . sulfamethoxazole-trimethoprim  1 tablet Oral Once per day on Mon Wed Fri   Audrie Kuri A  07/30/2016, 11:05 AM

## 2016-07-31 LAB — CBC
HCT: 30.8 % — ABNORMAL LOW (ref 36.0–46.0)
HEMOGLOBIN: 10.4 g/dL — AB (ref 12.0–15.0)
MCH: 28.8 pg (ref 26.0–34.0)
MCHC: 33.8 g/dL (ref 30.0–36.0)
MCV: 85.3 fL (ref 78.0–100.0)
Platelets: 146 10*3/uL — ABNORMAL LOW (ref 150–400)
RBC: 3.61 MIL/uL — AB (ref 3.87–5.11)
RDW: 15.8 % — ABNORMAL HIGH (ref 11.5–15.5)
WBC: 16 10*3/uL — AB (ref 4.0–10.5)

## 2016-07-31 LAB — RENAL FUNCTION PANEL
ANION GAP: 11 (ref 5–15)
Albumin: 4 g/dL (ref 3.5–5.0)
BUN: 12 mg/dL (ref 6–20)
CALCIUM: 8.5 mg/dL — AB (ref 8.9–10.3)
CHLORIDE: 98 mmol/L — AB (ref 101–111)
CO2: 28 mmol/L (ref 22–32)
CREATININE: 2.59 mg/dL — AB (ref 0.44–1.00)
GFR, EST AFRICAN AMERICAN: 20 mL/min — AB (ref >60)
GFR, EST NON AFRICAN AMERICAN: 17 mL/min — AB (ref >60)
Glucose, Bld: 86 mg/dL (ref 65–99)
Phosphorus: 2.1 mg/dL — ABNORMAL LOW (ref 2.5–4.6)
Potassium: 3.5 mmol/L (ref 3.5–5.1)
Sodium: 137 mmol/L (ref 135–145)

## 2016-07-31 LAB — TYPE AND SCREEN
ABO/RH(D): A POS
Antibody Screen: NEGATIVE
Unit division: 0

## 2016-07-31 LAB — BPAM RBC
BLOOD PRODUCT EXPIRATION DATE: 201804142359
ISSUE DATE / TIME: 201803241729
Unit Type and Rh: 6200

## 2016-07-31 NOTE — Progress Notes (Signed)
Patient ID: [name], female   DOB: 1945-10-10, 71 y.o.   MRN: [id number] [facility] Progress Note   Assessment/ Plan:   1. 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  on alternate day plasmapheresis (started 3/16- has had 7 treatments- last on 3/23)  GBM down from 125 to 33 at last check- one pending.   Cyclophosphamide initiation was delayed I guess to be based on the kidney biopsy- started at 100 daily oral on 3/20. At this time, she is oligoanuric but maybe making urine - not showing meaningful signs of renal recovery by labs  continue dialysis on a TTS schedule--- has spot TTS at [facility].  Want to wait a little before perm access.  2. Hypertension:  blood pressures improved with resumption of antihypertensive therapy/dialysis.  3. Anemia: Likely anemia of chronic disease with recent hospitalizations/vasculitis. She has low iron saturation but prohibitively high ferritin for intravenous iron bolusing. Now on Aranesp with hemodialysis. Transfused 3/20 one unit- another unit on 3/24 4. Hyperphosphatemia: phos OK no binder-  PTH 109, continue to monitor- no vit D. 5. Upper respiratory symptoms- she says better with only steroids and plasmapheresis.  Started cytoxan - appreciate  pulm opinion on immune drugs - repeat CT ordered   Subjective:   SHD yest- removed 1500 tolerated well- still saying a little urine- but maybe more   Objective:   BP (!) 151/61 (BP Location: Right Arm)   Pulse 61   Temp 98.5 F (36.9 C) (Oral)   Resp 14   Ht 5\' 3"  (1.6 m)   Wt 55.5 kg (122 lb 6.4 oz)   SpO2 100%   BMI 21.68 kg/m   Intake/Output Summary (Last 24 hours) at 07/31/16 1058 Last data filed at 07/31/16 0605  Gross per 24 hour  Intake              358 ml  Output              1500 ml  Net            -1142 ml   Weight change: -1.4 kg (-3 lb 1.4 oz)  Physical Exam: Gen: Comfortably resting in bed Right IJ PC CVS: Pulse regular rhythm, normal S1 and S2 Resp: Clear to auscultation, no rales/rhonchi Abd: Soft, flat, nontender Ext: No lower extremity edema  Imaging: Ct Chest Wo Contrast  Result Date: 07/29/2016 CLINICAL DATA:  71 year old female inpatient admitted for 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 Longstanding history of indeterminate pulmonary nodules, described as inflammatory pseudotumors on surgical lung biopsy of the left lung on 11/06/2008. EXAM: CT CHEST WITHOUT CONTRAST TECHNIQUE: Multidetector CT imaging of the chest was performed following the standard protocol without IV contrast. COMPARISON:  06/20/2016 chest CT.  07/26/2016 chest radiograph. FINDINGS: Cardiovascular: Top-normal heart size . No significant pericardial fluid/thickening. Right internal jugular dialysis catheter terminates at the cavoatrial junction. Atherosclerotic nonaneurysmal thoracic aorta. Stable dilated pulmonary arteries (main pulmonary artery diameter 3.4 cm). Mediastinum/Nodes: Stable heterogeneous thyroid gland with possible confluent small hypodense thyroid nodules. 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 No pathologically enlarged axillary, mediastinal or gross hilar lymph nodes, noting limited sensitivity for the detection of hilar adenopathy on  this noncontrast study. Lungs/Pleura: No pneumothorax. New trace dependent bilateral pleural effusions. Stable postsurgical changes from wedge resection in the left upper and left lower lobes. Mild centrilobular and paraseptal emphysema with mild diffuse bronchial wall thickening. No acute consolidative airspace disease or new significant pulmonary nodules. Stable parenchymal band in the posterior right  upper lobe with associated distortion, compatible with postinfectious/postinflammatory scarring. Stable chronic thickening of the peribronchovascular interstitium throughout both lungs with associated chronic mild distortion. Scattered irregular pulmonary nodules in the right lung are unchanged in the interval and not convincingly changed back to 12/04/2009 chest CT, compatible with benign inflammatory nodules. For example a 1.5 x 1.0 cm peripheral right upper lobe nodule (series 4/ image 41), measuring 1.5 x 1.0 cm on 06/20/2016 and 1.5 x 0.9 cm on 12/04/2009. Partially cystic irregular right lower lobe 2.2 x 1.7 cm nodule, measuring 2.3 x 1.8 cm on 06/20/2016 and 2.3 x 1.9 cm on 12/04/2009. Upper abdomen: Unremarkable. Musculoskeletal: No aggressive appearing focal osseous lesions. Mild thoracic spondylosis. New mild anasarca. IMPRESSION: 1. New mild anasarca and trace dependent bilateral pleural effusions. Top-normal heart size. 2. Scattered irregular pulmonary nodules in the right lung are stable in the interval and not convincingly changed back to 2011, compatible with benign inflammatory nodules given the results of the 2010 surgical left lung biopsy. 3. Stable postsurgical and postinflammatory scarring in both lungs. No acute pulmonary disease. 4. Mild emphysema with mild diffuse bronchial wall thickening, suggesting COPD. 5. Aortic atherosclerosis. 6. Stable dilated main pulmonary artery, suggesting pulmonary arterial hypertension. Electronically Signed   By: Ilona Sorrel M.D.   On: 07/29/2016 15:48    Labs: BMET  Recent Labs Lab 07/26/16 0520 07/27/16 0730 07/27/16 1600 07/27/16 1718 07/28/16 0540 07/29/16 0332 07/29/16 0724 07/30/16 0604 07/31/16 0538  NA 135 137 134* 137 138 136 136 137 137  K 4.1 3.9 4.3 3.9 3.9 3.7 3.5 3.5 3.5  CL 104 100* 100* 96* 103 98* 95* 104 98*  CO2 20* 29 23  --  25 27  --  22 28  GLUCOSE 141* 117* 154* 156* 116* 112* 102* 120* 86  BUN 48* 22* 26* 29* 31*  12 18 30* 12  CREATININE 5.20* 3.27* 3.71* 3.60* 4.20* 2.77* 3.00* 4.42* 2.59*  CALCIUM 8.4* 8.2* 8.2*  --  8.1* 7.9*  --  8.1* 8.5*  PHOS 4.1 2.7 3.1  --  3.1 3.3  --  3.6 2.1*   CBC  Recent Labs Lab 07/28/16 0540 07/29/16 0332 07/29/16 0724 07/30/16 0604 07/31/16 0538  WBC 12.0* 10.3  --  12.0* 16.0*  HGB 8.2* 7.8* 7.5* 7.5* 10.4*  HCT 24.1* 23.1* 22.0* 22.1* 30.8*  MCV 83.1 83.4  --  83.1 85.3  PLT 159 147*  --  150 146*   Medications:    . sodium chloride   Intravenous Once  . sodium chloride   Intravenous Once  . amLODipine  5 mg Oral Daily  . aspirin EC  81 mg Oral Daily  . atorvastatin  10 mg Oral q1800  . bisacodyl  10 mg Rectal Daily  . cyclophosphamide  100 mg Oral Daily  . darbepoetin (ARANESP) injection - DIALYSIS  150 mcg Intravenous Q Sat-HD  . feeding supplement  1 Container Oral BID BM  . fluticasone  2 spray Each Nare QHS  . loratadine  10 mg Oral Daily  . pantoprazole  40 mg Oral Daily  . polyethylene glycol  17 g Oral Daily  . predniSONE  60 mg  Oral Q breakfast  . sodium chloride  2 spray Each Nare TID  . sodium chloride flush  3 mL Intravenous Q12H  . sulfamethoxazole-trimethoprim  1 tablet Oral Once per day on Mon Wed Fri   Sameul Tagle A  07/31/2016, 10:58 AM

## 2016-07-31 NOTE — Progress Notes (Addendum)
PROGRESS NOTE                                                                                                                                                                                                             Patient Demographics:    [name], is a 71 y.o. female, DOB - 1946/03/23, WPY:[id number]  Admit date - 07/20/2016   Admitting Physician Waldemar Dickens, MD  Outpatient Primary MD for the patient is [facility], [place]  LOS - 11  Outpatient Specialists: NONE  No chief complaint on file.      Brief Narrative   71 y.o.femalewith medical history significant for COPD, esophageal stricture, GERD, hiatal hernia, hyperlipidemia, hypertension, IBS, cervical disc disease and history of lung nodules with extensive workup including lung biopsy ruling out Sjogren's disease. Pt has had recurrent history of sinusitis since last fall, treated with several rounds of antibiotics and corticosteroids which have not resulted in significant clinical improvement. She was hospitalized 2 months ago for fever/weight loss and relative hypotension with anorexia and night sweats and was treated for community-acquired pneumonia. She has continued to have sinus drainage with some epistaxis and bloody secretions and also had nausea with dysgeusia for the past few weeks or so. Pt also reported severe muscle pain and leg cramps over the same time period. Of note, pt is followed by Dr. Elsworth Soho for abnormal CT imaging &pulmonary nodules. Work up for this has included an open lung biopsy in 11/2008 in the setting of fever/night sweats and CT scan with pulmonary nodules which showed possible inflammatory pseudotumor vs reaction to old bronchopneumonia associated with some carcinoid tumorlets. Salivary  gland biospy 03/2009 was negative for Sjogren's disease and negative for autoimmune work up. Serial CT's were planned but she was lost to follow  up. She had a repeat CT of the chest in 06/2016 which showed volume loss, post surgical changes of the left hemithorax and RLL pulmonary nodules.   In regards to pt's creatinine, in January of 2018 was 1.7 and in February 2018 it ranged between 1.6 and 2.1 with a peak of 2.8 that prompted referral to nephrology. After evaluation 07/20/16, she was found to have an elevated creatinine level of 12.4 and given the high risk of suspicion for vasculitis pt was directly admitted to hospital for evaluation.  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 She was on solumedrol and then switched to PO prednisone. She is required to have plasmapheresis on alternate days for 2 weeks (started 07/22/2016).   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 Renal started on Cyclophosphamide 100 mg PO QD.  - She is on HD TTS. - repeat anti GBM to see titers pending. -has outpatient hemodialysis established. renal want to wait a little before perm access .  Essential hypertension - Continue Norvasc  -Improved.  Anemia of chronic renal disease -Aranesp when necessary with dialysis. Significantly  improved hemoglobin today.  Hyperphosphatemia - Low phosphorus diet, monitor with hemodialysis - PTH level  109  Thrombocytopenia Mild. Monitor  Leukocytosis Possibly due to steroid. No signs of infection.  Dyslipidemia - Continue Lipitor   RLL pulmonary nodules - Pulm recommended prophylactic bactrim while on immunosuppresion - Repeat CT imaging done this admission shows unchanged pulmonary nodule. If comparative CT in 2 months unchanged and no evidence of renal recover, doubt that there is benefit of continued therapy. - Monitor for hemoptysis - Follow up with Dr. Elsworth Soho at discharge >had prior PET arranged for May 2018 to review pulmonary nodules    Constipation Order Dulcolax suppository and MiraLAX.    Code Status : Full code  Family Communication  : None at bedside  Disposition Plan  : Home once cleared by nephrology, possibly in   Barriers For Discharge : Active symptoms  Consults  :   Nephrology Pulmonary IR    Procedures    RIJ cathter for HD placed 07/20/2016  Renal biopsy 07/21/2016  Plasmapheresis 07/22/16  HD TTS  Cytoxan - started 07/27/16  DVT Prophylaxis  :  SCDs  Lab Results  Component Value Date   PLT 146 (L) 07/31/2016    Antibiotics  :    Anti-infectives    Start     Dose/Rate Route Frequency Ordered Stop   07/27/16 0900  sulfamethoxazole-trimethoprim (BACTRIM DS,SEPTRA DS) 800-160 MG per tablet 1 tablet     1 tablet Oral Once per day on Mon Wed Fri 07/26/16 1639     07/20/16 1430  ceFAZolin (ANCEF) IVPB 2g/100 mL premix     2 g 200 mL/hr over 30 Minutes Intravenous To Radiology 07/20/16 1424 07/20/16 1641        Objective:   Vitals:   07/30/16 2028 07/30/16 2057 07/31/16 0546 07/31/16 0928  BP: (!) 152/77 (!) 141/56 (!) 162/72 (!) 151/61  Pulse: 68 72 65 61  Resp:  '14 15 14  ' Temp:  99.8 F (37.7 C) 98.9 F (37.2 C) 98.5 F (36.9 C)  TempSrc:  Oral Oral Oral  SpO2:  100% 99% 100%  Weight:  55.5 kg (122 lb 6.4 oz)    Height:        Wt Readings from Last 3 Encounters:  07/30/16 55.5 kg (122 lb 6.4 oz)  06/29/16 65.8  kg (145 lb)  06/19/16 64 kg (141 lb)     Intake/Output Summary (Last 24 hours) at 07/31/16 1128 Last data filed at 07/31/16 0605  Gross per 24 hour  Intake              358 ml  Output             1500 ml  Net            -1142 ml     Physical Exam  Gen: not in distress HEENT: moist mucosa, supple neck, rt temp HD catheter Chest: clear b/l, no added sounds CVS: N S1&S2, no murmurs,  GI: soft, NT, ND, Musculoskeletal: warm, no edema     Data Review:    CBC  Recent Labs Lab 07/27/16 1600  07/28/16 0540 07/29/16 0332 07/29/16 0724 07/30/16 0604 07/31/16 0538  WBC 12.5*  --  12.0* 10.3  --  12.0* 16.0*  HGB 9.5*  < > 8.2* 7.8* 7.5* 7.5* 10.4*  HCT 28.8*  < > 24.1* 23.1* 22.0* 22.1* 30.8*  PLT 167  --  159 147*  --  150 146*  MCV 84.0  --  83.1 83.4  --  83.1 85.3  MCH 27.7  --  28.3 28.2  --  28.2 28.8  MCHC 33.0  --  34.0 33.8  --  33.9 33.8  RDW 16.2*  --  16.1* 16.0*  --  16.3* 15.8*  < > = values in this interval not displayed.  Chemistries   Recent Labs Lab 07/27/16 1600  07/28/16 0540 07/29/16 0332 07/29/16 0724 07/30/16 0604 07/31/16 0538  NA 134*  < > 138 136 136 137 137  K 4.3  < > 3.9 3.7 3.5 3.5 3.5  CL 100*  < > 103 98* 95* 104 98*  CO2 23  --  25 27  --  22 28  GLUCOSE 154*  < > 116* 112* 102* 120* 86  BUN 26*  < > 31* 12 18 30* 12  CREATININE 3.71*  < > 4.20* 2.77* 3.00* 4.42* 2.59*  CALCIUM 8.2*  --  8.1* 7.9*  --  8.1* 8.5*  < > = values in this interval not displayed. ------------------------------------------------------------------------------------------------------------------ No results for input(s): CHOL, HDL, LDLCALC, TRIG, CHOLHDL, LDLDIRECT in the last 72 hours.  Lab Results  Component Value Date   HGBA1C 5.8 02/02/2010   ------------------------------------------------------------------------------------------------------------------ No results for input(s): TSH, T4TOTAL, T3FREE, THYROIDAB in the last 72 hours.  Invalid  input(s): FREET3 ------------------------------------------------------------------------------------------------------------------ No results for input(s): VITAMINB12, FOLATE, FERRITIN, TIBC, IRON, RETICCTPCT in the last 72 hours.  Coagulation profile No results for input(s): INR, PROTIME in the last 168 hours.  No results for input(s): DDIMER in the last 72 hours.  Cardiac Enzymes No results for input(s): CKMB, TROPONINI, MYOGLOBIN in the last 168 hours.  Invalid input(s): CK ------------------------------------------------------------------------------------------------------------------ No results found for: BNP  Inpatient Medications  Scheduled Meds: . sodium chloride   Intravenous Once  . sodium chloride   Intravenous Once  . amLODipine  5 mg Oral Daily  . aspirin EC  81 mg Oral Daily  . atorvastatin  10 mg Oral q1800  . bisacodyl  10 mg Rectal Daily  . cyclophosphamide  100 mg Oral Daily  . darbepoetin (ARANESP) injection - DIALYSIS  150 mcg Intravenous Q Sat-HD  . feeding supplement  1 Container Oral BID BM  . fluticasone  2 spray Each Nare QHS  .  loratadine  10 mg Oral Daily  . pantoprazole  40 mg Oral Daily  . polyethylene glycol  17 g Oral Daily  . predniSONE  60 mg Oral Q breakfast  . sodium chloride  2 spray Each Nare TID  . sodium chloride flush  3 mL Intravenous Q12H  . sulfamethoxazole-trimethoprim  1 tablet Oral Once per day on Mon Wed Fri   Continuous Infusions:  PRN Meds:.sodium chloride, calcium carbonate, methocarbamol (ROBAXIN)  IV, ondansetron **OR** ondansetron (ZOFRAN) IV, senna, sodium chloride flush  Micro Results No results found for this or any previous visit (from the past 240 hour(s)).  Radiology Reports Ct Chest Wo Contrast  Result Date: 07/29/2016 CLINICAL DATA:  71 year old female inpatient admitted for 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 Longstanding history of indeterminate pulmonary nodules,  described as inflammatory pseudotumors on surgical lung biopsy of the left lung on 11/06/2008. EXAM: CT CHEST WITHOUT CONTRAST TECHNIQUE: Multidetector CT imaging of the chest was performed following the standard protocol without IV contrast. COMPARISON:  06/20/2016 chest CT.  07/26/2016 chest radiograph. FINDINGS: Cardiovascular: Top-normal heart size . No significant pericardial fluid/thickening. Right internal jugular dialysis catheter terminates at the cavoatrial junction. Atherosclerotic nonaneurysmal thoracic aorta. Stable dilated pulmonary arteries (main pulmonary artery diameter 3.4 cm). Mediastinum/Nodes: Stable heterogeneous thyroid gland with possible confluent small hypodense thyroid nodules. 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 No pathologically enlarged axillary, mediastinal or gross hilar lymph nodes, noting limited sensitivity for the detection of hilar adenopathy on this noncontrast study. Lungs/Pleura: No pneumothorax. New trace dependent bilateral pleural effusions. Stable postsurgical changes from wedge resection in the left upper and left lower lobes. Mild centrilobular and paraseptal emphysema with mild diffuse bronchial wall thickening. No acute consolidative airspace disease or new significant pulmonary nodules. Stable parenchymal band in the posterior right upper lobe with associated distortion, compatible with postinfectious/postinflammatory scarring. Stable chronic thickening of the peribronchovascular interstitium throughout both lungs with associated chronic mild distortion. Scattered irregular pulmonary nodules in the right lung are unchanged in the interval and not convincingly changed back to 12/04/2009 chest CT, compatible with benign inflammatory nodules. For example a 1.5 x 1.0 cm peripheral right upper lobe nodule (series 4/ image 41), measuring 1.5 x 1.0 cm on 06/20/2016 and 1.5  x 0.9 cm on 12/04/2009. Partially cystic irregular right lower lobe 2.2 x 1.7 cm nodule, measuring 2.3 x 1.8 cm on 06/20/2016 and 2.3 x 1.9 cm on 12/04/2009. Upper abdomen: Unremarkable. Musculoskeletal: No aggressive appearing focal osseous lesions. Mild thoracic spondylosis. New mild anasarca. IMPRESSION: 1. New mild anasarca and trace dependent bilateral pleural effusions. Top-normal heart size. 2. Scattered irregular pulmonary nodules in the right lung are stable in the interval and not convincingly changed back to 2011, compatible with benign inflammatory nodules given the results of the 2010 surgical left lung biopsy. 3. Stable postsurgical and postinflammatory scarring in both lungs. No acute pulmonary disease. 4. Mild emphysema with mild diffuse bronchial wall thickening, suggesting COPD. 5. Aortic atherosclerosis. 6. Stable dilated main pulmonary artery, suggesting pulmonary arterial hypertension. Electronically Signed   By: Ilona Sorrel M.D.   On: 07/29/2016 15:48   US Biopsy  Result Date: 07/21/2016 INDICATION: Acute renal failure EXAM: ULTRASOUND-GUIDED BIOPSY OF THE RENAL CORTEX.  RANDOM CORE. MEDICATIONS: None. ANESTHESIA/SEDATION: Fentanyl 50 mcg IV; Versed 1 mg IV Moderate Sedation Time:  10 The patient was continuously monitored during the procedure by the  interventional radiology nurse under my direct supervision. FLUOROSCOPY TIME:  None. COMPLICATIONS: None immediate. PROCEDURE: Informed written consent was obtained from the patient after a thorough discussion of the procedural risks, benefits and alternatives. All questions were addressed. Maximal Sterile Barrier Technique was utilized including caps, mask, sterile gowns, sterile gloves, sterile drape, hand hygiene and skin antiseptic. A timeout was performed prior to the initiation of the procedure. The back was prepped with ChloraPrep in a sterile fashion, and a sterile drape was applied covering the operative field. A sterile gown and  sterile gloves were used for the procedure. Under sonographic guidance, 2 16 gauge core biopsies of the cortex of the lower pole of the left kidney were obtained. Final imaging was performed. Patient tolerated the procedure well without complication. Vital sign monitoring by nursing staff during the procedure will continue as patient is in the special procedures unit for post procedure observation. FINDINGS: The images document guide needle placement within the left lower pole renal cortex. Post biopsy images demonstrate no evidence of hemorrhage. IMPRESSION: Successful ultrasound-guided random renal cortex core biopsy. Electronically Signed   By: Marybelle Killings M.D.   On: 07/21/2016 14:36   Ir Fluoro Guide Cv Line Right  Result Date: 07/20/2016 INDICATION: End-stage renal disease. In need of durable intravenous access for the initiation of dialysis. EXAM: TUNNELED CENTRAL VENOUS HEMODIALYSIS CATHETER PLACEMENT WITH ULTRASOUND AND FLUOROSCOPIC GUIDANCE MEDICATIONS: Ancef 2 gm IV . The antibiotic was given in an appropriate time interval prior to skin puncture. ANESTHESIA/SEDATION: Versed 1 mg IV; Fentanyl 50 mcg IV; Moderate Sedation Time:  17 minutes The patient was continuously monitored during the procedure by the interventional radiology nurse under my direct supervision. FLUOROSCOPY TIME:  Fluoroscopy Time: 12 seconds (2 mGy). COMPLICATIONS: None immediate. PROCEDURE: Informed written consent was obtained from the patient after a discussion of the risks, benefits, and alternatives to treatment. Questions regarding the procedure were encouraged and answered. The right neck and chest were prepped with chlorhexidine in a sterile fashion, and a sterile drape was applied covering the operative field. Maximum barrier sterile technique with sterile gowns and gloves were used for the procedure. A timeout was performed prior to the initiation of the procedure. After creating a small venotomy incision, a micropuncture  kit was utilized to access the right internal jugular vein under direct, real-time ultrasound guidance after the overlying soft tissues were anesthetized with 1% lidocaine with epinephrine. Ultrasound image documentation was performed. The microwire was kinked to measure appropriate catheter length. A stiff Glidewire was advanced to the level of the IVC and the micropuncture sheath was exchanged for a peel-away sheath. A Palindrome tunneled hemodialysis catheter measuring 19 cm from tip to cuff was tunneled in a retrograde fashion from the anterior chest wall to the venotomy incision. The catheter was then placed through the peel-away sheath with tips ultimately positioned within the superior aspect of the right atrium. Final catheter positioning was confirmed and documented with a spot radiographic image. The catheter aspirates and flushes normally. The catheter was flushed with appropriate volume heparin dwells. The catheter exit site was secured with a 0-Prolene retention suture. The venotomy incision was closed with an interrupted 4-0 Vicryl, Dermabond and Steri-strips. Dressings were applied. The patient tolerated the procedure well without immediate post procedural complication. IMPRESSION: Successful placement of 19 cm tip to cuff tunneled hemodialysis catheter via the right internal jugular vein with tips terminating within the superior aspect of the right atrium. The catheter is ready for immediate use. Electronically Signed  By: [name] M.D.   On: 07/20/2016 17:12   Ir US Guide Vasc Access Right  Result Date: 07/20/2016 INDICATION: End-stage renal disease. In need of durable intravenous access for the initiation of dialysis. EXAM: TUNNELED CENTRAL VENOUS HEMODIALYSIS CATHETER PLACEMENT WITH ULTRASOUND AND FLUOROSCOPIC GUIDANCE MEDICATIONS: Ancef 2 gm IV . The antibiotic was given in an appropriate time interval prior to skin puncture. ANESTHESIA/SEDATION: Versed 1 mg IV; Fentanyl 50 mcg IV;  Moderate Sedation Time:  17 minutes The patient was continuously monitored during the procedure by the interventional radiology nurse under my direct supervision. FLUOROSCOPY TIME:  Fluoroscopy Time: 12 seconds (2 mGy). COMPLICATIONS: None immediate. PROCEDURE: Informed written consent was obtained from the patient after a discussion of the risks, benefits, and alternatives to treatment. Questions regarding the procedure were encouraged and answered. The right neck and chest were prepped with chlorhexidine in a sterile fashion, and a sterile drape was applied covering the operative field. Maximum barrier sterile technique with sterile gowns and gloves were used for the procedure. A timeout was performed prior to the initiation of the procedure. After creating a small venotomy incision, a micropuncture kit was utilized to access the right internal jugular vein under direct, real-time ultrasound guidance after the overlying soft tissues were anesthetized with 1% lidocaine with epinephrine. Ultrasound image documentation was performed. The microwire was kinked to measure appropriate catheter length. A stiff Glidewire was advanced to the level of the IVC and the micropuncture sheath was exchanged for a peel-away sheath. A Palindrome tunneled hemodialysis catheter measuring 19 cm from tip to cuff was tunneled in a retrograde fashion from the anterior chest wall to the venotomy incision. The catheter was then placed through the peel-away sheath with tips ultimately positioned within the superior aspect of the right atrium. Final catheter positioning was confirmed and documented with a spot radiographic image. The catheter aspirates and flushes normally. The catheter was flushed with appropriate volume heparin dwells. The catheter exit site was secured with a 0-Prolene retention suture. The venotomy incision was closed with an interrupted 4-0 Vicryl, Dermabond and Steri-strips. Dressings were applied. The patient tolerated  the procedure well without immediate post procedural complication. IMPRESSION: Successful placement of 19 cm tip to cuff tunneled hemodialysis catheter via the right internal jugular vein with tips terminating within the superior aspect of the right atrium. The catheter is ready for immediate use. Electronically Signed   By: [name] M.D.   On: 07/20/2016 17:12   Dg Chest Port 1 View  Result Date: 07/26/2016 CLINICAL DATA:  Pulmonary nodules EXAM: PORTABLE CHEST 1 VIEW COMPARISON:  06/19/2016, chest CT 06/20/2016 FINDINGS: Right dialysis catheter is in place with the tip at the cavoatrial junction. Postoperative changes on the left. Areas of scarring and nodularity in the left lung are stable. Decreasing airspace opacities in the right lung with some residual areas of nodularity. Heart is borderline in size. No visible effusions. IMPRESSION: Stable scarring and nodularity in the left lung with postoperative changes. Improving areas of nodularity in the right lung. Electronically Signed   By: Rolm Baptise M.D.   On: 07/26/2016 16:30   Ct Maxillofacial Wo Contrast  Result Date: 07/20/2016 CLINICAL DATA:  71 y/o  F; recurrent sinusitis. EXAM: CT MAXILLOFACIAL WITHOUT CONTRAST TECHNIQUE: Multidetector CT imaging of the maxillofacial structures was performed. Multiplanar CT image reconstructions were also generated. A small metallic BB was placed on the right temple in order to reliably differentiate right from left. COMPARISON:  03/13/2016 MRI head.  12/02/2015 CT head. FINDINGS: Frontal sinus: Normally aerated. Patent frontal sinus drainage pathways. Ethmoid sinus: Normally aerated. Maxillary sinuses:  Normally aerated. Sphenoid sinus: Normally aerated. Patent sphenoid ethmoidal recesses. Right ostiomeatal unit:  Patent. Left ostiomeatal unit:  Patent. Nasal passages: Patent. Intact nasal septum. Mild leftward nasal septal deviation and small spur. Other: Orbits and intracranial compartment are unremarkable.  Mastoid air cells are normally pneumatized. IMPRESSION: Normally aerated paranasal sinuses and patent sinus drainage pathways. Electronically Signed   By: Kristine Garbe M.D.   On: 07/20/2016 14:22    Time Spent in minutes  25   Louellen Molder M.D on 07/31/2016 at 11:28 AM  Between 7am to 7pm - Pager - [phone]  After 7pm go to [email or web address] - password [email or web address]  [facility] -  Office  [phone]

## 2016-08-01 LAB — POCT I-STAT, CHEM 8
BUN: 31 mg/dL — ABNORMAL HIGH (ref 6–20)
Calcium, Ion: 1.16 mmol/L (ref 1.15–1.40)
Chloride: 96 mmol/L — ABNORMAL LOW (ref 101–111)
Creatinine, Ser: 4.5 mg/dL — ABNORMAL HIGH (ref 0.44–1.00)
GLUCOSE: 93 mg/dL (ref 65–99)
HCT: 31 % — ABNORMAL LOW (ref 36.0–46.0)
Hemoglobin: 10.5 g/dL — ABNORMAL LOW (ref 12.0–15.0)
Potassium: 3.8 mmol/L (ref 3.5–5.1)
Sodium: 136 mmol/L (ref 135–145)
TCO2: 27 mmol/L (ref 0–100)

## 2016-08-01 LAB — RENAL FUNCTION PANEL
Albumin: 3.7 g/dL (ref 3.5–5.0)
Anion gap: 12 (ref 5–15)
BUN: 29 mg/dL — ABNORMAL HIGH (ref 6–20)
CALCIUM: 8.4 mg/dL — AB (ref 8.9–10.3)
CHLORIDE: 100 mmol/L — AB (ref 101–111)
CO2: 25 mmol/L (ref 22–32)
CREATININE: 4.27 mg/dL — AB (ref 0.44–1.00)
GFR calc Af Amer: 11 mL/min — ABNORMAL LOW (ref >60)
GFR calc non Af Amer: 9 mL/min — ABNORMAL LOW (ref >60)
Glucose, Bld: 99 mg/dL (ref 65–99)
Phosphorus: 4.2 mg/dL (ref 2.5–4.6)
Potassium: 4.1 mmol/L (ref 3.5–5.1)
Sodium: 137 mmol/L (ref 135–145)

## 2016-08-01 LAB — GLOMERULAR BASEMENT MEMBRANE ANTIBODIES: GBM AB: 34 U — AB (ref 0–20)

## 2016-08-01 MED ORDER — ACD FORMULA A 0.73-2.45-2.2 GM/100ML VI SOLN
500.0000 mL | Status: DC
  Administered 2016-08-01: 500 mL via INTRAVENOUS
  Filled 2016-08-01: qty 500

## 2016-08-01 MED ORDER — HEPARIN SODIUM (PORCINE) 1000 UNIT/ML IJ SOLN: 1000.0000 [IU] | Freq: Once | INTRAMUSCULAR | Status: DC

## 2016-08-01 MED ORDER — SODIUM CHLORIDE 0.9 % IV SOLN
4.0000 g | Freq: Once | INTRAVENOUS | Status: AC
  Administered 2016-08-01: 4 g via INTRAVENOUS
  Filled 2016-08-01: qty 40

## 2016-08-01 MED ORDER — ACD FORMULA A 0.73-2.45-2.2 GM/100ML VI SOLN
Status: AC
  Filled 2016-08-01: qty 500

## 2016-08-01 MED ORDER — ACETAMINOPHEN 325 MG PO TABS: 650.0000 mg | ORAL_TABLET | ORAL | Status: DC | PRN

## 2016-08-01 MED ORDER — CALCIUM CARBONATE ANTACID 500 MG PO CHEW
CHEWABLE_TABLET | ORAL | Status: AC
  Filled 2016-08-01: qty 2

## 2016-08-01 MED ORDER — ACD FORMULA A 0.73-2.45-2.2 GM/100ML VI SOLN
Status: AC
  Administered 2016-08-01: 11:00:00
  Filled 2016-08-01: qty 500

## 2016-08-01 MED ORDER — DIPHENHYDRAMINE HCL 25 MG PO CAPS: 25.0000 mg | ORAL_CAPSULE | Freq: Four times a day (QID) | ORAL | Status: DC | PRN

## 2016-08-01 MED ORDER — SODIUM CHLORIDE 0.9 % IV SOLN
INTRAVENOUS | Status: AC
  Administered 2016-08-01 (×3): via INTRAVENOUS_CENTRAL
  Filled 2016-08-01 (×3): qty 200

## 2016-08-01 MED ORDER — CALCIUM CARBONATE ANTACID 500 MG PO CHEW
2.0000 | CHEWABLE_TABLET | ORAL | Status: AC
Start: 2016-08-01 — End: 2016-08-01
  Administered 2016-08-01: 400 mg via ORAL

## 2016-08-01 NOTE — Progress Notes (Signed)
PT Cancellation Note  Patient Details Name: [name] MRN: [id number] DOB: [birth year]   Cancelled Treatment:    Reason Eval/Treat Not Completed: Patient declined, no reason specified.  Just got back from mobility down the hall. 08/01/2016  Donnella Sham, [place] [phone]  (pager)   [name] [name] 08/01/2016, 1:11 PM

## 2016-08-01 NOTE — Progress Notes (Signed)
Patient ID: [name], female   DOB: 12-01-1945, 71 y.o.   MRN: [id number] [facility] Progress Note    Subjective:   Feeling nauseous and can't eat her dinner Zofran helps Says feels like has a "chunk" in the back of her throat that won't come up Sinuses still bothering her Due for HD tomorrow Had TPE today   Objective:   BP (!) 152/72 (BP Location: Right Arm)   Pulse 69   Temp 98.6 F (37 C) (Oral)   Resp 16   Ht 5\' 3"  (1.6 m)   Wt 55.3 kg (121 lb 14.6 oz)   SpO2 98%   BMI 21.60 kg/m   Intake/Output Summary (Last 24 hours) at 08/01/16 1824 Last data filed at 08/01/16 1757  Gross per 24 hour  Intake              600 ml  Output                0 ml  Net              600 ml   Weight change: -2.2 kg (-4 lb 13.6 oz)  Physical Exam: Comfortably resting in bed  VS as noted Right IJ PC  Regular rhythm, normal S1 and S2 Clear to auscultation, no rales/rhonchi Abd Soft, flat, nontender No lower extremity edema  Ct Chest Wo Contrast  Result Date: 07/29/2016 CLINICAL DATA:  71 year old female inpatient admitted for 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 Longstanding history of indeterminate pulmonary nodules, described as inflammatory pseudotumors on surgical lung biopsy of the left lung on 11/06/2008. EXAM: CT CHEST WITHOUT CONTRAST TECHNIQUE: Multidetector CT imaging of the chest was performed following the standard protocol without IV contrast. COMPARISON:  06/20/2016 chest CT.  07/26/2016 chest radiograph. FINDINGS: Cardiovascular: Top-normal heart size . No significant pericardial fluid/thickening. Right internal jugular dialysis catheter terminates at the cavoatrial junction. Atherosclerotic nonaneurysmal thoracic aorta. Stable dilated pulmonary arteries (main pulmonary artery diameter 3.4 cm). Mediastinum/Nodes: Stable heterogeneous thyroid gland with possible confluent small hypodense thyroid nodules. 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 No pathologically enlarged axillary, mediastinal or gross hilar lymph nodes, noting limited sensitivity for the detection of hilar adenopathy on this noncontrast study. Lungs/Pleura: No pneumothorax. New trace dependent bilateral pleural effusions. Stable postsurgical changes from wedge resection in the left upper and left lower lobes. Mild centrilobular and paraseptal emphysema with mild diffuse bronchial wall thickening. No acute consolidative airspace disease or new significant pulmonary nodules. Stable parenchymal band in the posterior right upper lobe with associated distortion, compatible with postinfectious/postinflammatory scarring. Stable chronic thickening of the peribronchovascular interstitium throughout both lungs with associated chronic mild distortion. Scattered irregular pulmonary nodules in the right lung are unchanged in the interval and not convincingly changed back to 12/04/2009 chest CT, compatible with benign inflammatory nodules. For example a 1.5 x 1.0 cm peripheral right upper lobe nodule (series 4/ image 41), measuring 1.5 x 1.0 cm on 06/20/2016 and 1.5 x 0.9 cm on 12/04/2009. Partially cystic irregular right lower lobe 2.2 x 1.7 cm nodule, measuring 2.3 x 1.8 cm on 06/20/2016 and 2.3 x 1.9 cm on 12/04/2009. Upper abdomen: Unremarkable. Musculoskeletal: No aggressive appearing focal osseous lesions. Mild thoracic spondylosis. New mild anasarca. IMPRESSION: 1. New mild anasarca and trace dependent bilateral pleural effusions. Top-normal heart size. 2. Scattered irregular pulmonary nodules in the right lung are  stable in the interval and not convincingly changed back to 2011, compatible with benign inflammatory nodules given the results of the 2010 surgical left lung biopsy. 3. Stable postsurgical and postinflammatory scarring in both lungs. No acute pulmonary disease. 4.  Mild emphysema with mild diffuse bronchial wall thickening, suggesting COPD. 5. Aortic atherosclerosis. 6. Stable dilated main pulmonary artery, suggesting pulmonary arterial hypertension. Electronically Signed   By: Ilona Sorrel M.D.   On: 07/29/2016 15:48    Recent Labs Lab 07/27/16 0730 07/27/16 1600  07/28/16 0540 07/29/16 0332 07/29/16 0724 07/30/16 0604 07/31/16 0538 08/01/16 0630 08/01/16 1016  NA 137 134*  < > 138 136 136 137 137 137 136  K 3.9 4.3  < > 3.9 3.7 3.5 3.5 3.5 4.1 3.8  CL 100* 100*  < > 103 98* 95* 104 98* 100* 96*  CO2 29 23  --  25 27  --  22 28 25   --   GLUCOSE 117* 154*  < > 116* 112* 102* 120* 86 99 93  BUN 22* 26*  < > 31* 12 18 30* 12 29* 31*  CREATININE 3.27* 3.71*  < > 4.20* 2.77* 3.00* 4.42* 2.59* 4.27* 4.50*  CALCIUM 8.2* 8.2*  --  8.1* 7.9*  --  8.1* 8.5* 8.4*  --   PHOS 2.7 3.1  --  3.1 3.3  --  3.6 2.1* 4.2  --   < > = values in this interval not displayed.   Recent Labs Lab 07/28/16 0540 07/29/16 0332 07/29/16 0724 07/30/16 0604 07/31/16 0538 08/01/16 1016  WBC 12.0* 10.3  --  12.0* 16.0*  --   HGB 8.2* 7.8* 7.5* 7.5* 10.4* 10.5*  HCT 24.1* 23.1* 22.0* 22.1* 30.8* 31.0*  MCV 83.1 83.4  --  83.1 85.3  --   PLT 159 147*  --  150 146*  --    Medications:    . sodium chloride   Intravenous Once  . sodium chloride   Intravenous Once  . amLODipine  5 mg Oral Daily  . aspirin EC  81 mg Oral Daily  . atorvastatin  10 mg Oral q1800  . bisacodyl  10 mg Rectal Daily  . cyclophosphamide  100 mg Oral Daily  . darbepoetin (ARANESP) injection - DIALYSIS  150 mcg Intravenous Q Sat-HD  . feeding supplement  1 Container Oral BID BM  . fluticasone  2 spray Each Nare QHS  . heparin  1,000 Units Intracatheter Once  . loratadine  10 mg Oral Daily  . pantoprazole  40 mg Oral Daily  . polyethylene glycol  17 g Oral Daily  . predniSONE  60 mg Oral Q breakfast  . sodium chloride  2 spray Each Nare TID  . sodium chloride flush  3 mL Intravenous Q12H   . sulfamethoxazole-trimethoprim  1 tablet Oral Once per day on Mon Wed Fri     Assessment/ Plan:    1. 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  On alternate day plasmapheresis (started 3/16- has had 8 treatments- last one this AM)  GBM down from 125 to 33 on 3/21 (prior to TPE on 3/21), 34 on 3/25 (and had  TPE today). So no further drop. CTX initiation was delayed (I guess to be based on the kidney biopsy) - started at 100 daily  oral on 3/20. At this time, she is oligoanuric and not showing meaningful signs of renal recovery by labs  continue dialysis on a TTS schedule--- has spot T[facility].  Want to wait a little before perm access.  2. Hypertension:  blood pressures improved with resumption of antihypertensive therapy/dialysis.  3. Anemia: Likely anemia of chronic disease with recent hospitalizations/vasculitis. She has low iron saturation but prohibitively high ferritin for intravenous iron bolusing. Now on Aranesp with hemodialysis. Transfused 3/20 one unit- another unit on 3/24 4. Hyperphosphatemia: phos OK no binder-  PTH 109, continue to monitor- no vit D. 5. Upper respiratory symptoms- she says better with only steroids and plasmapheresis.  Started cytoxan - appreciate  pulm opinion on immune drugs.  Jamal Maes, MD [facility] [phone] Pager 08/01/2016, 6:24 PM

## 2016-08-01 NOTE — Progress Notes (Signed)
PROGRESS NOTE                                                                                                                                                                                                             Patient Demographics:    [name], is a 71 y.o. female, DOB - [birth year], BOF:[id number]  Admit date - 07/20/2016   Admitting Physician Waldemar Dickens, MD  Outpatient Primary MD for the patient is [facility], MD  LOS - 12  Outpatient Specialists: NONE  No chief complaint on file.      Brief Narrative   71 y.o.femalewith medical history significant for COPD, esophageal stricture, GERD, hiatal hernia, hyperlipidemia, hypertension, IBS, cervical disc disease and history of lung nodules with extensive workup including lung biopsy ruling out Sjogren's disease. Pt has had recurrent history of sinusitis since last fall, treated with several rounds of antibiotics and corticosteroids which have not resulted in significant clinical improvement. She was hospitalized 2 months ago for fever/weight loss and relative hypotension with anorexia and night sweats and was treated for community-acquired pneumonia. She has continued to have sinus drainage with some epistaxis and bloody secretions and also had nausea with dysgeusia for the past few weeks or so. Pt also reported severe muscle pain and leg cramps over the same time period. Of note, pt is followed by Dr. Elsworth Soho for abnormal CT imaging &pulmonary nodules. Work up for this has included an open lung biopsy in 11/2008 in the setting of fever/night sweats and CT scan with pulmonary nodules which showed possible inflammatory pseudotumor vs reaction to old bronchopneumonia associated with some carcinoid tumorlets. Salivary  gland biospy 03/2009 was negative for Sjogren's disease and negative for autoimmune work up. Serial CT's were planned but she was lost to follow  up. She had a repeat CT of the chest in 06/2016 which showed volume loss, post surgical changes of the left hemithorax and RLL pulmonary nodules.   In regards to pt's creatinine, in January of 2018 was 1.7 and in February 2018 it ranged between 1.6 and 2.1 with a peak of 2.8 that prompted referral to nephrology. After evaluation 07/20/16, she was found to have an elevated creatinine level of 12.4 and given the high risk of suspicion for vasculitis pt was directly admitted to hospital for evaluation.  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 She was on solumedrol and then switched to PO prednisone. She is required to have plasmapheresis on alternate days for 2 weeks (started 07/22/2016).   Subjective:   Nauseous this am. 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 Renal started on Cyclophosphamide 100 mg PO QD.  - She is on HD TTS. - repeat anti GBM of 34. Renal will decide on further plasma exchange needed. -has outpatient hemodialysis established. renal want to wait a little before perm access .  Essential hypertension - Continue Norvasc  -Improved.  Anemia of chronic renal disease -Aranesp when necessary with dialysis. Significantly  improved hemoglobin today.  Hyperphosphatemia - Low phosphorus diet, monitor  with hemodialysis - PTH level 109  Thrombocytopenia Mild. Monitor  Leukocytosis Possibly due to steroid. No signs of infection.  Dyslipidemia - Continue Lipitor   RLL pulmonary nodules - Pulm recommended prophylactic bactrim while on immunosuppresion - Repeat CT imaging done this admission shows unchanged pulmonary nodule. If comparative CT in 2 months unchanged and no evidence of renal recover, doubt that there is benefit of continued therapy. - Monitor for hemoptysis - Follow up with Dr. Elsworth Soho at discharge >had prior PET arranged for May 2018 to review pulmonary nodules    Constipation Order Dulcolax suppository and MiraLAX.    Code Status : Full code  Family Communication  : None at bedside  Disposition Plan  : Home once cleared by nephrology, once duration and frequency  of plasma exchange determined.  Barriers For Discharge : Active symptoms  Consults  :   Nephrology Pulmonary IR    Procedures    RIJ cathter for HD placed 07/20/2016  Renal biopsy 07/21/2016  Plasmapheresis 07/22/16  HD TTS  Cytoxan - started 07/27/16  DVT Prophylaxis  :  SCDs  Lab Results  Component Value Date   PLT 146 (L) 07/31/2016    Antibiotics  :    Anti-infectives    Start     Dose/Rate Route Frequency Ordered Stop   07/27/16 0900  sulfamethoxazole-trimethoprim (BACTRIM DS,SEPTRA DS) 800-160 MG per tablet 1 tablet     1 tablet Oral Once per day on Mon Wed Fri 07/26/16 1639     07/20/16 1430  ceFAZolin (ANCEF) IVPB 2g/100 mL premix     2 g 200 mL/hr over 30 Minutes Intravenous To Radiology 07/20/16 1424 07/20/16 1641        Objective:   Vitals:   08/01/16 1045 08/01/16 1100 08/01/16 1118 08/01/16 1125  BP: (!) 171/67 (!) 167/69 (!) 167/77 (!) 167/69  Pulse: 69 68 62 65  Resp: 16 (!) '21 15 11  ' Temp: 97.6 F (36.4 C)  97.8 F (36.6 C) 97.1 F (36.2 C)  TempSrc: Oral  Oral Oral  SpO2:    98%  Weight:      Height:  Wt Readings from Last 3 Encounters:   07/31/16 55.3 kg (121 lb 14.6 oz)  06/29/16 65.8 kg (145 lb)  06/19/16 64 kg (141 lb)     Intake/Output Summary (Last 24 hours) at 08/01/16 1322 Last data filed at 08/01/16 0900  Gross per 24 hour  Intake              530 ml  Output                0 ml  Net              530 ml     Physical Exam  Gen: pleasant , not in distress HEENT: moist mucosa,  rt temp HD catheter Chest: clear b/l, no added sounds CVS: N S1&S2, no murmurs,  GI: soft, NT, ND, Musculoskeletal: warm, no edema     Data Review:    CBC  Recent Labs Lab 07/27/16 1600  07/28/16 0540 07/29/16 0332 07/29/16 0724 07/30/16 0604 07/31/16 0538 08/01/16 1016  WBC 12.5*  --  12.0* 10.3  --  12.0* 16.0*  --   HGB 9.5*  < > 8.2* 7.8* 7.5* 7.5* 10.4* 10.5*  HCT 28.8*  < > 24.1* 23.1* 22.0* 22.1* 30.8* 31.0*  PLT 167  --  159 147*  --  150 146*  --   MCV 84.0  --  83.1 83.4  --  83.1 85.3  --   MCH 27.7  --  28.3 28.2  --  28.2 28.8  --   MCHC 33.0  --  34.0 33.8  --  33.9 33.8  --   RDW 16.2*  --  16.1* 16.0*  --  16.3* 15.8*  --   < > = values in this interval not displayed.  Chemistries   Recent Labs Lab 07/28/16 0540 07/29/16 0332 07/29/16 0724 07/30/16 0604 07/31/16 0538 08/01/16 0630 08/01/16 1016  NA 138 136 136 137 137 137 136  K 3.9 3.7 3.5 3.5 3.5 4.1 3.8  CL 103 98* 95* 104 98* 100* 96*  CO2 25 27  --  '22 28 25  ' --   GLUCOSE 116* 112* 102* 120* 86 99 93  BUN 31* 12 18 30* 12 29* 31*  CREATININE 4.20* 2.77* 3.00* 4.42* 2.59* 4.27* 4.50*  CALCIUM 8.1* 7.9*  --  8.1* 8.5* 8.4*  --    ------------------------------------------------------------------------------------------------------------------ No results for input(s): CHOL, HDL, LDLCALC, TRIG, CHOLHDL, LDLDIRECT in the last 72 hours.  Lab Results  Component Value Date   HGBA1C 5.8 02/02/2010   ------------------------------------------------------------------------------------------------------------------ No results for  input(s): TSH, T4TOTAL, T3FREE, THYROIDAB in the last 72 hours.  Invalid input(s): FREET3 ------------------------------------------------------------------------------------------------------------------ No results for input(s): VITAMINB12, FOLATE, FERRITIN, TIBC, IRON, RETICCTPCT in the last 72 hours.  Coagulation profile No results for input(s): INR, PROTIME in the last 168 hours.  No results for input(s): DDIMER in the last 72 hours.  Cardiac Enzymes No results for input(s): CKMB, TROPONINI, MYOGLOBIN in the last 168 hours.  Invalid input(s): CK ------------------------------------------------------------------------------------------------------------------ No results found for: BNP  Inpatient Medications  Scheduled Meds: . sodium chloride   Intravenous Once  . sodium chloride   Intravenous Once  . amLODipine  5 mg Oral Daily  . aspirin EC  81 mg Oral Daily  . atorvastatin  10 mg Oral q1800  . bisacodyl  10 mg Rectal Daily  . calcium carbonate  2 tablet Oral Q3H  . calcium gluconate IVPB  4 g Intravenous Once  . cyclophosphamide  100 mg  Oral Daily  . darbepoetin (ARANESP) injection - DIALYSIS  150 mcg Intravenous Q Sat-HD  . feeding supplement  1 Container Oral BID BM  . fluticasone  2 spray Each Nare QHS  . heparin  1,000 Units Intracatheter Once  . loratadine  10 mg Oral Daily  . pantoprazole  40 mg Oral Daily  . polyethylene glycol  17 g Oral Daily  . predniSONE  60 mg Oral Q breakfast  . sodium chloride  2 spray Each Nare TID  . sodium chloride flush  3 mL Intravenous Q12H  . sulfamethoxazole-trimethoprim  1 tablet Oral Once per day on Mon Wed Fri   Continuous Infusions: . citrate dextrose     PRN Meds:.sodium chloride, acetaminophen, calcium carbonate, diphenhydrAMINE, methocarbamol (ROBAXIN)  IV, ondansetron **OR** ondansetron (ZOFRAN) IV, senna, sodium chloride flush  Micro Results No results found for this or any previous visit (from the past 240  hour(s)).  Radiology Reports Ct Chest Wo Contrast  Result Date: 07/29/2016 CLINICAL DATA:  71 year old female inpatient admitted for 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 Longstanding history of indeterminate pulmonary nodules, described as inflammatory pseudotumors on surgical lung biopsy of the left lung on 11/06/2008. EXAM: CT CHEST WITHOUT CONTRAST TECHNIQUE: Multidetector CT imaging of the chest was performed following the standard protocol without IV contrast. COMPARISON:  06/20/2016 chest CT.  07/26/2016 chest radiograph. FINDINGS: Cardiovascular: Top-normal heart size . No significant pericardial fluid/thickening. Right internal jugular dialysis catheter terminates at the cavoatrial junction. Atherosclerotic nonaneurysmal thoracic aorta. Stable dilated pulmonary arteries (main pulmonary artery diameter 3.4 cm). Mediastinum/Nodes: Stable heterogeneous thyroid gland with possible confluent small hypodense thyroid nodules. 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 No pathologically enlarged axillary, mediastinal or gross hilar lymph nodes, noting limited sensitivity for the detection of hilar adenopathy on this noncontrast study. Lungs/Pleura: No pneumothorax. New trace dependent bilateral pleural effusions. Stable postsurgical changes from wedge resection in the left upper and left lower lobes. Mild centrilobular and paraseptal emphysema with mild diffuse bronchial wall thickening. No acute consolidative airspace disease or new significant pulmonary nodules. Stable parenchymal band in the posterior right upper lobe with associated distortion, compatible with postinfectious/postinflammatory scarring. Stable chronic thickening of the peribronchovascular interstitium throughout both lungs with associated chronic mild distortion. Scattered irregular pulmonary nodules in the right  lung are unchanged in the interval and not convincingly changed back to 12/04/2009 chest CT, compatible with benign inflammatory nodules. For example a 1.5 x 1.0 cm peripheral right upper lobe nodule (series 4/ image 41), measuring 1.5 x 1.0 cm on 06/20/2016 and 1.5 x 0.9 cm on 12/04/2009. Partially cystic irregular right lower lobe 2.2 x 1.7 cm nodule, measuring 2.3 x 1.8 cm on 06/20/2016 and 2.3 x 1.9 cm on 12/04/2009. Upper abdomen: Unremarkable. Musculoskeletal: No aggressive appearing focal osseous lesions. Mild thoracic spondylosis. New mild anasarca. IMPRESSION: 1. New mild anasarca and trace dependent bilateral pleural effusions. Top-normal heart size. 2. Scattered irregular pulmonary nodules in the right lung are stable in the interval and not convincingly changed back to 2011, compatible with benign inflammatory nodules given the results of the 2010 surgical left lung biopsy. 3. Stable postsurgical and postinflammatory scarring in both lungs. No acute pulmonary disease. 4. Mild emphysema with mild diffuse bronchial wall thickening, suggesting COPD. 5. Aortic atherosclerosis. 6. Stable dilated main pulmonary artery, suggesting pulmonary arterial hypertension. Electronically Signed   By: Janina Mayo.D.  On: 07/29/2016 15:48   US Biopsy  Result Date: 07/21/2016 INDICATION: Acute renal failure EXAM: ULTRASOUND-GUIDED BIOPSY OF THE RENAL CORTEX.  RANDOM CORE. MEDICATIONS: None. ANESTHESIA/SEDATION: Fentanyl 50 mcg IV; Versed 1 mg IV Moderate Sedation Time:  10 The patient was continuously monitored during the procedure by the interventional radiology nurse under my direct supervision. FLUOROSCOPY TIME:  None. COMPLICATIONS: None immediate. PROCEDURE: Informed written consent was obtained from the patient after a thorough discussion of the procedural risks, benefits and alternatives. All questions were addressed. Maximal Sterile Barrier Technique was utilized including caps, mask, sterile gowns, sterile  gloves, sterile drape, hand hygiene and skin antiseptic. A timeout was performed prior to the initiation of the procedure. The back was prepped with ChloraPrep in a sterile fashion, and a sterile drape was applied covering the operative field. A sterile gown and sterile gloves were used for the procedure. Under sonographic guidance, 2 16 gauge core biopsies of the cortex of the lower pole of the left kidney were obtained. Final imaging was performed. Patient tolerated the procedure well without complication. Vital sign monitoring by nursing staff during the procedure will continue as patient is in the special procedures unit for post procedure observation. FINDINGS: The images document guide needle placement within the left lower pole renal cortex. Post biopsy images demonstrate no evidence of hemorrhage. IMPRESSION: Successful ultrasound-guided random renal cortex core biopsy. Electronically Signed   By: Marybelle Killings M.D.   On: 07/21/2016 14:36   Ir Fluoro Guide Cv Line Right  Result Date: 07/20/2016 INDICATION: End-stage renal disease. In need of durable intravenous access for the initiation of dialysis. EXAM: TUNNELED CENTRAL VENOUS HEMODIALYSIS CATHETER PLACEMENT WITH ULTRASOUND AND FLUOROSCOPIC GUIDANCE MEDICATIONS: Ancef 2 gm IV . The antibiotic was given in an appropriate time interval prior to skin puncture. ANESTHESIA/SEDATION: Versed 1 mg IV; Fentanyl 50 mcg IV; Moderate Sedation Time:  17 minutes The patient was continuously monitored during the procedure by the interventional radiology nurse under my direct supervision. FLUOROSCOPY TIME:  Fluoroscopy Time: 12 seconds (2 mGy). COMPLICATIONS: None immediate. PROCEDURE: Informed written consent was obtained from the patient after a discussion of the risks, benefits, and alternatives to treatment. Questions regarding the procedure were encouraged and answered. The right neck and chest were prepped with chlorhexidine in a sterile fashion, and a sterile  drape was applied covering the operative field. Maximum barrier sterile technique with sterile gowns and gloves were used for the procedure. A timeout was performed prior to the initiation of the procedure. After creating a small venotomy incision, a micropuncture kit was utilized to access the right internal jugular vein under direct, real-time ultrasound guidance after the overlying soft tissues were anesthetized with 1% lidocaine with epinephrine. Ultrasound image documentation was performed. The microwire was kinked to measure appropriate catheter length. A stiff Glidewire was advanced to the level of the IVC and the micropuncture sheath was exchanged for a peel-away sheath. A Palindrome tunneled hemodialysis catheter measuring 19 cm from tip to cuff was tunneled in a retrograde fashion from the anterior chest wall to the venotomy incision. The catheter was then placed through the peel-away sheath with tips ultimately positioned within the superior aspect of the right atrium. Final catheter positioning was confirmed and documented with a spot radiographic image. The catheter aspirates and flushes normally. The catheter was flushed with appropriate volume heparin dwells. The catheter exit site was secured with a 0-Prolene retention suture. The venotomy incision was closed with an interrupted 4-0 Vicryl, Dermabond and Steri-strips. Dressings  were applied. The patient tolerated the procedure well without immediate post procedural complication. IMPRESSION: Successful placement of 19 cm tip to cuff tunneled hemodialysis catheter via the right internal jugular vein with tips terminating within the superior aspect of the right atrium. The catheter is ready for immediate use. Electronically Signed   By: [name] M.D.   On: 07/20/2016 17:12   Ir US Guide Vasc Access Right  Result Date: 07/20/2016 INDICATION: End-stage renal disease. In need of durable intravenous access for the initiation of dialysis. EXAM:  TUNNELED CENTRAL VENOUS HEMODIALYSIS CATHETER PLACEMENT WITH ULTRASOUND AND FLUOROSCOPIC GUIDANCE MEDICATIONS: Ancef 2 gm IV . The antibiotic was given in an appropriate time interval prior to skin puncture. ANESTHESIA/SEDATION: Versed 1 mg IV; Fentanyl 50 mcg IV; Moderate Sedation Time:  17 minutes The patient was continuously monitored during the procedure by the interventional radiology nurse under my direct supervision. FLUOROSCOPY TIME:  Fluoroscopy Time: 12 seconds (2 mGy). COMPLICATIONS: None immediate. PROCEDURE: Informed written consent was obtained from the patient after a discussion of the risks, benefits, and alternatives to treatment. Questions regarding the procedure were encouraged and answered. The right neck and chest were prepped with chlorhexidine in a sterile fashion, and a sterile drape was applied covering the operative field. Maximum barrier sterile technique with sterile gowns and gloves were used for the procedure. A timeout was performed prior to the initiation of the procedure. After creating a small venotomy incision, a micropuncture kit was utilized to access the right internal jugular vein under direct, real-time ultrasound guidance after the overlying soft tissues were anesthetized with 1% lidocaine with epinephrine. Ultrasound image documentation was performed. The microwire was kinked to measure appropriate catheter length. A stiff Glidewire was advanced to the level of the IVC and the micropuncture sheath was exchanged for a peel-away sheath. A Palindrome tunneled hemodialysis catheter measuring 19 cm from tip to cuff was tunneled in a retrograde fashion from the anterior chest wall to the venotomy incision. The catheter was then placed through the peel-away sheath with tips ultimately positioned within the superior aspect of the right atrium. Final catheter positioning was confirmed and documented with a spot radiographic image. The catheter aspirates and flushes normally. The  catheter was flushed with appropriate volume heparin dwells. The catheter exit site was secured with a 0-Prolene retention suture. The venotomy incision was closed with an interrupted 4-0 Vicryl, Dermabond and Steri-strips. Dressings were applied. The patient tolerated the procedure well without immediate post procedural complication. IMPRESSION: Successful placement of 19 cm tip to cuff tunneled hemodialysis catheter via the right internal jugular vein with tips terminating within the superior aspect of the right atrium. The catheter is ready for immediate use. Electronically Signed   By: [name] M.D.   On: 07/20/2016 17:12   Dg Chest Port 1 View  Result Date: 07/26/2016 CLINICAL DATA:  Pulmonary nodules EXAM: PORTABLE CHEST 1 VIEW COMPARISON:  06/19/2016, chest CT 06/20/2016 FINDINGS: Right dialysis catheter is in place with the tip at the cavoatrial junction. Postoperative changes on the left. Areas of scarring and nodularity in the left lung are stable. Decreasing airspace opacities in the right lung with some residual areas of nodularity. Heart is borderline in size. No visible effusions. IMPRESSION: Stable scarring and nodularity in the left lung with postoperative changes. Improving areas of nodularity in the right lung. Electronically Signed   By: Rolm Baptise M.D.   On: 07/26/2016 16:30   Ct Maxillofacial Wo Contrast  Result Date: 07/20/2016 CLINICAL DATA:  71 y/o  F; recurrent sinusitis. EXAM: CT MAXILLOFACIAL WITHOUT CONTRAST TECHNIQUE: Multidetector CT imaging of the maxillofacial structures was performed. Multiplanar CT image reconstructions were also generated. A small metallic BB was placed on the right temple in order to reliably differentiate right from left. COMPARISON:  03/13/2016 MRI head.  12/02/2015 CT head. FINDINGS: Frontal sinus: Normally aerated. Patent frontal sinus drainage pathways. Ethmoid sinus: Normally aerated. Maxillary sinuses:  Normally aerated. Sphenoid sinus:  Normally aerated. Patent sphenoid ethmoidal recesses. Right ostiomeatal unit:  Patent. Left ostiomeatal unit:  Patent. Nasal passages: Patent. Intact nasal septum. Mild leftward nasal septal deviation and small spur. Other: Orbits and intracranial compartment are unremarkable. Mastoid air cells are normally pneumatized. IMPRESSION: Normally aerated paranasal sinuses and patent sinus drainage pathways. Electronically Signed   By: Kristine Garbe M.D.   On: 07/20/2016 14:22    Time Spent in minutes  25   Louellen Molder M.D on 08/01/2016 at 1:22 PM  Between 7am to 7pm - Pager - [phone]  After 7pm go to [email or web address] - password [facility]  [facility] -  Office  [phone]

## 2016-08-02 LAB — RENAL FUNCTION PANEL
Albumin: 4.3 g/dL (ref 3.5–5.0)
Anion gap: 15 (ref 5–15)
BUN: 42 mg/dL — AB (ref 6–20)
CHLORIDE: 103 mmol/L (ref 101–111)
CO2: 19 mmol/L — AB (ref 22–32)
CREATININE: 5.35 mg/dL — AB (ref 0.44–1.00)
Calcium: 8.5 mg/dL — ABNORMAL LOW (ref 8.9–10.3)
GFR calc Af Amer: 8 mL/min — ABNORMAL LOW (ref >60)
GFR calc non Af Amer: 7 mL/min — ABNORMAL LOW (ref >60)
Glucose, Bld: 91 mg/dL (ref 65–99)
Phosphorus: 4.9 mg/dL — ABNORMAL HIGH (ref 2.5–4.6)
Potassium: 4.1 mmol/L (ref 3.5–5.1)
Sodium: 137 mmol/L (ref 135–145)

## 2016-08-02 LAB — CBC
HEMATOCRIT: 27.5 % — AB (ref 36.0–46.0)
Hemoglobin: 9.5 g/dL — ABNORMAL LOW (ref 12.0–15.0)
MCH: 30.3 pg (ref 26.0–34.0)
MCHC: 34.5 g/dL (ref 30.0–36.0)
MCV: 87.6 fL (ref 78.0–100.0)
PLATELETS: 72 10*3/uL — AB (ref 150–400)
RBC: 3.14 MIL/uL — ABNORMAL LOW (ref 3.87–5.11)
RDW: 17.4 % — AB (ref 11.5–15.5)
WBC: 12.8 10*3/uL — ABNORMAL HIGH (ref 4.0–10.5)

## 2016-08-02 MED ORDER — CYCLOPHOSPHAMIDE 50 MG PO CAPS
50.0000 mg | ORAL_CAPSULE | Freq: Every day | ORAL | Status: DC
  Filled 2016-08-02: qty 1

## 2016-08-02 NOTE — Procedures (Signed)
I have personally attended this patient's dialysis session.  TDC R IJ No heparin Labs all pending  Jamal Maes, MD [facility] [phone] Pager 08/02/2016, 7:47 AM

## 2016-08-02 NOTE — Progress Notes (Signed)
PT Cancellation Note  Patient Details Name: [name] MRN: [id number] DOB: 1945-11-16   Cancelled Treatment:    Reason Eval/Treat Not Completed: Patient at procedure or test/unavailable Pt currently in HD. Will reattempt in afternoon as schedule allows. If unable to eval today, will reattempt tomorrow.   Nicky Pugh, PT, DPT  Acute Rehabilitation Services  Pager: [phone]    Army Melia 08/02/2016, 10:06 AM

## 2016-08-02 NOTE — Progress Notes (Signed)
PROGRESS NOTE                                                                                                                                                                                                             Patient Demographics:    [name], is a 71 y.o. female, DOB - 01-31-1946, OIB:[id number]  Admit date - 07/20/2016   Admitting Physician Waldemar Dickens, MD  Outpatient Primary MD for the patient is [facility], MD  LOS - 13  Outpatient Specialists: NONE  No chief complaint on file.      Brief Narrative   71 y.o.femalewith medical history significant for COPD, esophageal stricture, GERD, hiatal hernia, hyperlipidemia, hypertension, IBS, cervical disc disease and history of lung nodules with extensive workup including lung biopsy ruling out Sjogren's disease. Pt has had recurrent history of sinusitis since last fall, treated with several rounds of antibiotics and corticosteroids which have not resulted in significant clinical improvement. She was hospitalized 2 months ago for fever/weight loss and relative hypotension with anorexia and night sweats and was treated for community-acquired pneumonia. She has continued to have sinus drainage with some epistaxis and bloody secretions and also had nausea with dysgeusia for the past few weeks or so. Pt also reported severe muscle pain and leg cramps over the same time period. Of note, pt is followed by Dr. Elsworth Soho for abnormal CT imaging &pulmonary nodules. Work up for this has included an open lung biopsy in 11/2008 in the setting of fever/night sweats and CT scan with pulmonary nodules which showed possible inflammatory pseudotumor vs reaction to old bronchopneumonia associated with some carcinoid tumorlets. Salivary  gland biospy 03/2009 was negative for Sjogren's disease and negative for autoimmune work up. Serial CT's were planned but she was lost to follow  up. She had a repeat CT of the chest in 06/2016 which showed volume loss, post surgical changes of the left hemithorax and RLL pulmonary nodules.   In regards to pt's creatinine, in January of 2018 was 1.7 and in February 2018 it ranged between 1.6 and 2.1 with a peak of 2.8 that prompted referral to nephrology. After evaluation 07/20/16, she was found to have an elevated creatinine level of 12.4 and given the high risk of suspicion for vasculitis pt was directly admitted to hospital for evaluation.  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 She was on solumedrol and then switched to PO prednisone. She is required to have plasmapheresis on alternate days for 2 weeks (started 07/22/2016).   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 Renal started on Cyclophosphamide 100 mg PO QD.  - She has outpt HD established ,  TTS. ( has spot at [facility])  - repeat anti GBM of 34.  -has outpatient hemodialysis established. renal want to wait a little before perm access .( If no return to renal function in next 4 weeks).  Essential hypertension - Continue Norvasc  -Improved.  Anemia of chronic renal disease -Aranesp when necessary with dialysis. Significantly  improved hemoglobin today.  Hyperphosphatemia - Low phosphorus diet, monitor with  hemodialysis - PTH level 109  Thrombocytopenia Mild. Monitor  Leukocytosis Possibly due to steroid. No signs of infection.  Dyslipidemia - Continue Lipitor   RLL pulmonary nodules - Pulm recommended prophylactic bactrim while on immunosuppresion - Repeat CT imaging done this admission shows unchanged pulmonary nodule. If comparative CT in 2 months unchanged and no evidence of renal recover, doubt that there is benefit of continued therapy. - Monitor for hemoptysis - Follow up with Dr. Elsworth Soho at discharge >had prior PET arranged for May 2018 to review pulmonary nodules .   Constipation Not relieved with  Dulcolax suppository and MiraLAX. Ordered tap water enema.    Code Status : Full code  Family Communication  : None at bedside  Disposition Plan  : Home  once no further plasma exchange needed   Barriers For Discharge : Active symptoms  Consults  :   Nephrology Pulmonary IR    Procedures    RIJ cathter for HD placed 07/20/2016  Renal biopsy 07/21/2016  Plasmapheresis 07/22/16  HD TTS  Cytoxan - started 07/27/16  DVT Prophylaxis  :  SCDs ( no heparin for 1 month post renal bx)  Lab Results  Component Value Date   PLT 72 (L) 08/02/2016    Antibiotics  :    Anti-infectives    Start     Dose/Rate Route Frequency Ordered Stop   07/27/16 0900  sulfamethoxazole-trimethoprim (BACTRIM DS,SEPTRA DS) 800-160 MG per tablet 1 tablet     1 tablet Oral Once per day on Mon Wed Fri 07/26/16 1639     07/20/16 1430  ceFAZolin (ANCEF) IVPB 2g/100 mL premix     2 g 200 mL/hr over 30 Minutes Intravenous To Radiology 07/20/16 1424 07/20/16 1641        Objective:   Vitals:   08/02/16 1030 08/02/16 1100 08/02/16 1109 08/02/16 1137  BP: 135/72 (!) 151/77 (!) 160/64 (!) 152/61  Pulse: 69 68 61 67  Resp:   18 18  Temp:   98.2 F (36.8 C) 98.4 F (36.9 C)  TempSrc:   Oral Oral  SpO2:   100%   Weight:  54.9 kg (121 lb 0.5 oz)   Height:        Wt Readings from  Last 3 Encounters:  08/02/16 54.9 kg (121 lb 0.5 oz)  06/29/16 65.8 kg (145 lb)  06/19/16 64 kg (141 lb)     Intake/Output Summary (Last 24 hours) at 08/02/16 1258 Last data filed at 08/02/16 1109  Gross per 24 hour  Intake              620 ml  Output             1000 ml  Net             -380 ml     Physical Exam  Gen: , not in distress HEENT: moist mucosa,  rt temp HD catheter Chest: clear b/l, no added sounds CVS: N S1&S2, no murmurs,  GI: soft, NT, ND, Musculoskeletal: warm, no edema     Data Review:    CBC  Recent Labs Lab 07/28/16 0540 07/29/16 0332 07/29/16 0724 07/30/16 0604 07/31/16 0538 08/01/16 1016 08/02/16 0737  WBC 12.0* 10.3  --  12.0* 16.0*  --  12.8*  HGB 8.2* 7.8* 7.5* 7.5* 10.4* 10.5* 9.5*  HCT 24.1* 23.1* 22.0* 22.1* 30.8* 31.0* 27.5*  PLT 159 147*  --  150 146*  --  72*  MCV 83.1 83.4  --  83.1 85.3  --  87.6  MCH 28.3 28.2  --  28.2 28.8  --  30.3  MCHC 34.0 33.8  --  33.9 33.8  --  34.5  RDW 16.1* 16.0*  --  16.3* 15.8*  --  17.4*    Chemistries   Recent Labs Lab 07/29/16 0332  07/30/16 0604 07/31/16 0538 08/01/16 0630 08/01/16 1016 08/02/16 0602  NA 136  < > 137 137 137 136 137  K 3.7  < > 3.5 3.5 4.1 3.8 4.1  CL 98*  < > 104 98* 100* 96* 103  CO2 27  --  '22 28 25  ' --  19*  GLUCOSE 112*  < > 120* 86 99 93 91  BUN 12  < > 30* 12 29* 31* 42*  CREATININE 2.77*  < > 4.42* 2.59* 4.27* 4.50* 5.35*  CALCIUM 7.9*  --  8.1* 8.5* 8.4*  --  8.5*  < > = values in this interval not displayed. ------------------------------------------------------------------------------------------------------------------ No results for input(s): CHOL, HDL, LDLCALC, TRIG, CHOLHDL, LDLDIRECT in the last 72 hours.  Lab Results  Component Value Date   HGBA1C 5.8 02/02/2010   ------------------------------------------------------------------------------------------------------------------ No results for input(s): TSH, T4TOTAL, T3FREE, THYROIDAB in  the last 72 hours.  Invalid input(s): FREET3 ------------------------------------------------------------------------------------------------------------------ No results for input(s): VITAMINB12, FOLATE, FERRITIN, TIBC, IRON, RETICCTPCT in the last 72 hours.  Coagulation profile No results for input(s): INR, PROTIME in the last 168 hours.  No results for input(s): DDIMER in the last 72 hours.  Cardiac Enzymes No results for input(s): CKMB, TROPONINI, MYOGLOBIN in the last 168 hours.  Invalid input(s): CK ------------------------------------------------------------------------------------------------------------------ No results found for: BNP  Inpatient Medications  Scheduled Meds: . sodium chloride   Intravenous Once  . sodium chloride   Intravenous Once  . amLODipine  5 mg Oral Daily  . aspirin EC  81 mg Oral Daily  . atorvastatin  10 mg Oral q1800  . bisacodyl  10 mg Rectal Daily  . cyclophosphamide  100 mg Oral Daily  . darbepoetin (ARANESP) injection - DIALYSIS  150 mcg Intravenous Q Sat-HD  . feeding supplement  1 Container  Oral BID BM  . fluticasone  2 spray Each Nare QHS  . loratadine  10 mg Oral Daily  . pantoprazole  40 mg Oral Daily  . polyethylene glycol  17 g Oral Daily  . predniSONE  60 mg Oral Q breakfast  . sodium chloride  2 spray Each Nare TID  . sodium chloride flush  3 mL Intravenous Q12H  . sulfamethoxazole-trimethoprim  1 tablet Oral Once per day on Mon Wed Fri   Continuous Infusions:  PRN Meds:.sodium chloride, calcium carbonate, methocarbamol (ROBAXIN)  IV, ondansetron **OR** ondansetron (ZOFRAN) IV, senna, sodium chloride flush  Micro Results No results found for this or any previous visit (from the past 240 hour(s)).  Radiology Reports Ct Chest Wo Contrast  Result Date: 07/29/2016 CLINICAL DATA:  71 year old female inpatient admitted for 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 Longstanding history of  indeterminate pulmonary nodules, described as inflammatory pseudotumors on surgical lung biopsy of the left lung on 11/06/2008. EXAM: CT CHEST WITHOUT CONTRAST TECHNIQUE: Multidetector CT imaging of the chest was performed following the standard protocol without IV contrast. COMPARISON:  06/20/2016 chest CT.  07/26/2016 chest radiograph. FINDINGS: Cardiovascular: Top-normal heart size . No significant pericardial fluid/thickening. Right internal jugular dialysis catheter terminates at the cavoatrial junction. Atherosclerotic nonaneurysmal thoracic aorta. Stable dilated pulmonary arteries (main pulmonary artery diameter 3.4 cm). Mediastinum/Nodes: Stable heterogeneous thyroid gland with possible confluent small hypodense thyroid nodules. 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 No pathologically enlarged axillary, mediastinal or gross hilar lymph nodes, noting limited sensitivity for the detection of hilar adenopathy on this noncontrast study. Lungs/Pleura: No pneumothorax. New trace dependent bilateral pleural effusions. Stable postsurgical changes from wedge resection in the left upper and left lower lobes. Mild centrilobular and paraseptal emphysema with mild diffuse bronchial wall thickening. No acute consolidative airspace disease or new significant pulmonary nodules. Stable parenchymal band in the posterior right upper lobe with associated distortion, compatible with postinfectious/postinflammatory scarring. Stable chronic thickening of the peribronchovascular interstitium throughout both lungs with associated chronic mild distortion. Scattered irregular pulmonary nodules in the right lung are unchanged in the interval and not convincingly changed back to 12/04/2009 chest CT, compatible with benign inflammatory nodules. For example a 1.5 x 1.0 cm peripheral right upper lobe nodule (series 4/ image 41), measuring  1.5 x 1.0 cm on 06/20/2016 and 1.5 x 0.9 cm on 12/04/2009. Partially cystic irregular right lower lobe 2.2 x 1.7 cm nodule, measuring 2.3 x 1.8 cm on 06/20/2016 and 2.3 x 1.9 cm on 12/04/2009. Upper abdomen: Unremarkable. Musculoskeletal: No aggressive appearing focal osseous lesions. Mild thoracic spondylosis. New mild anasarca. IMPRESSION: 1. New mild anasarca and trace dependent bilateral pleural effusions. Top-normal heart size. 2. Scattered irregular pulmonary nodules in the right lung are stable in the interval and not convincingly changed back to 2011, compatible with benign inflammatory nodules given the results of the 2010 surgical left lung biopsy. 3. Stable postsurgical and postinflammatory scarring in both lungs. No acute pulmonary disease. 4. Mild emphysema with mild diffuse bronchial wall thickening, suggesting COPD. 5. Aortic atherosclerosis. 6. Stable dilated main pulmonary artery, suggesting pulmonary arterial hypertension. Electronically Signed   By: Ilona Sorrel M.D.   On: 07/29/2016 15:48   US Biopsy  Result Date: 07/21/2016 INDICATION: Acute renal failure EXAM: ULTRASOUND-GUIDED BIOPSY OF THE RENAL CORTEX.  RANDOM CORE. MEDICATIONS: None. ANESTHESIA/SEDATION: Fentanyl 50 mcg IV; Versed 1 mg IV Moderate  Sedation Time:  10 The patient was continuously monitored during the procedure by the interventional radiology nurse under my direct supervision. FLUOROSCOPY TIME:  None. COMPLICATIONS: None immediate. PROCEDURE: Informed written consent was obtained from the patient after a thorough discussion of the procedural risks, benefits and alternatives. All questions were addressed. Maximal Sterile Barrier Technique was utilized including caps, mask, sterile gowns, sterile gloves, sterile drape, hand hygiene and skin antiseptic. A timeout was performed prior to the initiation of the procedure. The back was prepped with ChloraPrep in a sterile fashion, and a sterile drape was applied covering the  operative field. A sterile gown and sterile gloves were used for the procedure. Under sonographic guidance, 2 16 gauge core biopsies of the cortex of the lower pole of the left kidney were obtained. Final imaging was performed. Patient tolerated the procedure well without complication. Vital sign monitoring by nursing staff during the procedure will continue as patient is in the special procedures unit for post procedure observation. FINDINGS: The images document guide needle placement within the left lower pole renal cortex. Post biopsy images demonstrate no evidence of hemorrhage. IMPRESSION: Successful ultrasound-guided random renal cortex core biopsy. Electronically Signed   By: Marybelle Killings M.D.   On: 07/21/2016 14:36   Ir Fluoro Guide Cv Line Right  Result Date: 07/20/2016 INDICATION: End-stage renal disease. In need of durable intravenous access for the initiation of dialysis. EXAM: TUNNELED CENTRAL VENOUS HEMODIALYSIS CATHETER PLACEMENT WITH ULTRASOUND AND FLUOROSCOPIC GUIDANCE MEDICATIONS: Ancef 2 gm IV . The antibiotic was given in an appropriate time interval prior to skin puncture. ANESTHESIA/SEDATION: Versed 1 mg IV; Fentanyl 50 mcg IV; Moderate Sedation Time:  17 minutes The patient was continuously monitored during the procedure by the interventional radiology nurse under my direct supervision. FLUOROSCOPY TIME:  Fluoroscopy Time: 12 seconds (2 mGy). COMPLICATIONS: None immediate. PROCEDURE: Informed written consent was obtained from the patient after a discussion of the risks, benefits, and alternatives to treatment. Questions regarding the procedure were encouraged and answered. The right neck and chest were prepped with chlorhexidine in a sterile fashion, and a sterile drape was applied covering the operative field. Maximum barrier sterile technique with sterile gowns and gloves were used for the procedure. A timeout was performed prior to the initiation of the procedure. After creating a small  venotomy incision, a micropuncture kit was utilized to access the right internal jugular vein under direct, real-time ultrasound guidance after the overlying soft tissues were anesthetized with 1% lidocaine with epinephrine. Ultrasound image documentation was performed. The microwire was kinked to measure appropriate catheter length. A stiff Glidewire was advanced to the level of the IVC and the micropuncture sheath was exchanged for a peel-away sheath. A Palindrome tunneled hemodialysis catheter measuring 19 cm from tip to cuff was tunneled in a retrograde fashion from the anterior chest wall to the venotomy incision. The catheter was then placed through the peel-away sheath with tips ultimately positioned within the superior aspect of the right atrium. Final catheter positioning was confirmed and documented with a spot radiographic image. The catheter aspirates and flushes normally. The catheter was flushed with appropriate volume heparin dwells. The catheter exit site was secured with a 0-Prolene retention suture. The venotomy incision was closed with an interrupted 4-0 Vicryl, Dermabond and Steri-strips. Dressings were applied. The patient tolerated the procedure well without immediate post procedural complication. IMPRESSION: Successful placement of 19 cm tip to cuff tunneled hemodialysis catheter via the right internal jugular vein with tips terminating within the superior  aspect of the right atrium. The catheter is ready for immediate use. Electronically Signed   By: [name] M.D.   On: 07/20/2016 17:12   Ir US Guide Vasc Access Right  Result Date: 07/20/2016 INDICATION: End-stage renal disease. In need of durable intravenous access for the initiation of dialysis. EXAM: TUNNELED CENTRAL VENOUS HEMODIALYSIS CATHETER PLACEMENT WITH ULTRASOUND AND FLUOROSCOPIC GUIDANCE MEDICATIONS: Ancef 2 gm IV . The antibiotic was given in an appropriate time interval prior to skin puncture. ANESTHESIA/SEDATION: Versed  1 mg IV; Fentanyl 50 mcg IV; Moderate Sedation Time:  17 minutes The patient was continuously monitored during the procedure by the interventional radiology nurse under my direct supervision. FLUOROSCOPY TIME:  Fluoroscopy Time: 12 seconds (2 mGy). COMPLICATIONS: None immediate. PROCEDURE: Informed written consent was obtained from the patient after a discussion of the risks, benefits, and alternatives to treatment. Questions regarding the procedure were encouraged and answered. The right neck and chest were prepped with chlorhexidine in a sterile fashion, and a sterile drape was applied covering the operative field. Maximum barrier sterile technique with sterile gowns and gloves were used for the procedure. A timeout was performed prior to the initiation of the procedure. After creating a small venotomy incision, a micropuncture kit was utilized to access the right internal jugular vein under direct, real-time ultrasound guidance after the overlying soft tissues were anesthetized with 1% lidocaine with epinephrine. Ultrasound image documentation was performed. The microwire was kinked to measure appropriate catheter length. A stiff Glidewire was advanced to the level of the IVC and the micropuncture sheath was exchanged for a peel-away sheath. A Palindrome tunneled hemodialysis catheter measuring 19 cm from tip to cuff was tunneled in a retrograde fashion from the anterior chest wall to the venotomy incision. The catheter was then placed through the peel-away sheath with tips ultimately positioned within the superior aspect of the right atrium. Final catheter positioning was confirmed and documented with a spot radiographic image. The catheter aspirates and flushes normally. The catheter was flushed with appropriate volume heparin dwells. The catheter exit site was secured with a 0-Prolene retention suture. The venotomy incision was closed with an interrupted 4-0 Vicryl, Dermabond and Steri-strips. Dressings were  applied. The patient tolerated the procedure well without immediate post procedural complication. IMPRESSION: Successful placement of 19 cm tip to cuff tunneled hemodialysis catheter via the right internal jugular vein with tips terminating within the superior aspect of the right atrium. The catheter is ready for immediate use. Electronically Signed   By: [name] M.D.   On: 07/20/2016 17:12   Dg Chest Port 1 View  Result Date: 07/26/2016 CLINICAL DATA:  Pulmonary nodules EXAM: PORTABLE CHEST 1 VIEW COMPARISON:  06/19/2016, chest CT 06/20/2016 FINDINGS: Right dialysis catheter is in place with the tip at the cavoatrial junction. Postoperative changes on the left. Areas of scarring and nodularity in the left lung are stable. Decreasing airspace opacities in the right lung with some residual areas of nodularity. Heart is borderline in size. No visible effusions. IMPRESSION: Stable scarring and nodularity in the left lung with postoperative changes. Improving areas of nodularity in the right lung. Electronically Signed   By: Rolm Baptise M.D.   On: 07/26/2016 16:30   Ct Maxillofacial Wo Contrast  Result Date: 07/20/2016 CLINICAL DATA:  71 y/o  F; recurrent sinusitis. EXAM: CT MAXILLOFACIAL WITHOUT CONTRAST TECHNIQUE: Multidetector CT imaging of the maxillofacial structures was performed. Multiplanar CT image reconstructions were also generated. A small metallic BB was placed on the  right temple in order to reliably differentiate right from left. COMPARISON:  03/13/2016 MRI head.  12/02/2015 CT head. FINDINGS: Frontal sinus: Normally aerated. Patent frontal sinus drainage pathways. Ethmoid sinus: Normally aerated. Maxillary sinuses:  Normally aerated. Sphenoid sinus: Normally aerated. Patent sphenoid ethmoidal recesses. Right ostiomeatal unit:  Patent. Left ostiomeatal unit:  Patent. Nasal passages: Patent. Intact nasal septum. Mild leftward nasal septal deviation and small spur. Other: Orbits and  intracranial compartment are unremarkable. Mastoid air cells are normally pneumatized. IMPRESSION: Normally aerated paranasal sinuses and patent sinus drainage pathways. Electronically Signed   By: Kristine Garbe M.D.   On: 07/20/2016 14:22    Time Spent in minutes  25   Louellen Molder M.D on 08/02/2016 at 12:58 PM  Between 7am to 7pm - Pager - [phone]  After 7pm go to [email or web address] - password [facility]  [facility] -  Office  [phone]

## 2016-08-02 NOTE — Care Management Important Message (Signed)
Important Message  Patient Details  Name: [name] MRN: [id number] Date of Birth: December 24, 1945   Medicare Important Message Given:  Yes    Yailine Ballard 08/02/2016, 12:00 PM

## 2016-08-02 NOTE — Progress Notes (Signed)
Spoke with Dr. Lorrene Reid about CBC results from this morning.  Hemoglobin dropped slightly to 9.5, platelets dropped to 72.  Empirically reduce Cytoxan dose to 50mg  PO daily starting 3/28, recheck CBC in the morning.  Orders entered.  Fidel Caggiano D. Taiquan Campanaro, PharmD, BCPS Clinical Pharmacist Pager: [phone] 08/02/2016 4:17 PM

## 2016-08-02 NOTE — Progress Notes (Signed)
Patient ID: [name], female   DOB: 1946-04-30, 71 y.o.   MRN: [id number] [facility] Progress Note    Subjective:   Took zofran at dinner last PM and was  Had TPE #8 yesterday AntiGBM Ab 33-35 X 2 determinations (from a high of 123 pre TPE) For HD today   Objective:   BP (!) 135/54 (BP Location: Right Arm)   Pulse 72   Temp 98.6 F (37 C) (Oral)   Resp 17   Ht 5\' 3"  (1.6 m)   Wt 57.1 kg (125 lb 14.1 oz) Comment: weights are taken the night of  SpO2 100%   BMI 22.30 kg/m   Intake/Output Summary (Last 24 hours) at 08/02/16 0725 Last data filed at 08/02/16 0210  Gross per 24 hour  Intake              860 ml  Output                0 ml  Net              860 ml   Weight change: 1.8 kg (3 lb 15.5 oz)  Physical Exam: Comfortably resting in bed  VS as noted Right IJ PC currently being accessed for HD Regular rhythm, normal S1 and S2 Clear to auscultation, no rales/rhonchi Abd Soft, flat, nontender No lower extremity edema  Ct Chest Wo Contrast  Result Date: 07/29/2016 CLINICAL DATA:  71 year old female inpatient admitted for 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 Longstanding history of indeterminate pulmonary nodules, described as inflammatory pseudotumors on surgical lung biopsy of the left lung on 11/06/2008. EXAM: CT CHEST WITHOUT CONTRAST TECHNIQUE: Multidetector CT imaging of the chest was performed following the standard protocol without IV contrast. COMPARISON:  06/20/2016 chest CT.  07/26/2016 chest radiograph. FINDINGS: Cardiovascular: Top-normal heart size . No significant pericardial fluid/thickening. Right internal jugular dialysis catheter terminates at the cavoatrial junction. Atherosclerotic nonaneurysmal thoracic aorta. Stable dilated pulmonary arteries (main pulmonary artery diameter 3.4 cm). Mediastinum/Nodes: Stable heterogeneous thyroid gland with possible confluent small hypodense thyroid nodules. 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 No pathologically enlarged axillary, mediastinal or gross hilar lymph nodes, noting limited sensitivity for the detection of hilar adenopathy on this noncontrast study. Lungs/Pleura: No pneumothorax. New trace dependent bilateral pleural effusions. Stable postsurgical changes from wedge resection in the left upper and left lower lobes. Mild centrilobular and paraseptal emphysema with mild diffuse bronchial wall thickening. No acute consolidative airspace disease or new significant pulmonary nodules. Stable parenchymal band in the posterior right upper lobe with associated distortion, compatible with postinfectious/postinflammatory scarring. Stable chronic thickening of the peribronchovascular interstitium throughout both lungs with associated chronic mild distortion. Scattered irregular pulmonary nodules in the right lung are unchanged in the interval and not convincingly changed back to 12/04/2009 chest CT, compatible with benign inflammatory nodules. For example a 1.5 x 1.0 cm peripheral right upper lobe nodule (series 4/ image 41), measuring 1.5 x 1.0 cm on 06/20/2016 and 1.5 x 0.9 cm on 12/04/2009. Partially cystic irregular right lower lobe 2.2 x 1.7 cm nodule, measuring 2.3 x 1.8 cm on 06/20/2016 and 2.3 x 1.9 cm on 12/04/2009. Upper abdomen: Unremarkable. Musculoskeletal: No aggressive appearing focal osseous lesions. Mild thoracic spondylosis. New mild anasarca. IMPRESSION: 1. New mild anasarca and trace dependent bilateral pleural effusions. Top-normal heart size. 2. Scattered irregular pulmonary nodules in the  right lung are stable in the interval and not convincingly changed back to 2011, compatible with benign inflammatory nodules given the results of the 2010 surgical left lung biopsy. 3. Stable postsurgical and postinflammatory scarring in both lungs. No acute pulmonary  disease. 4. Mild emphysema with mild diffuse bronchial wall thickening, suggesting COPD. 5. Aortic atherosclerosis. 6. Stable dilated main pulmonary artery, suggesting pulmonary arterial hypertension. Electronically Signed   By: Ilona Sorrel M.D.   On: 07/29/2016 15:48    Recent Labs Lab 07/27/16 0730 07/27/16 1600  07/28/16 0540 07/29/16 0332 07/29/16 0724 07/30/16 0604 07/31/16 0538 08/01/16 0630 08/01/16 1016  NA 137 134*  < > 138 136 136 137 137 137 136  K 3.9 4.3  < > 3.9 3.7 3.5 3.5 3.5 4.1 3.8  CL 100* 100*  < > 103 98* 95* 104 98* 100* 96*  CO2 29 23  --  25 27  --  22 28 25   --   GLUCOSE 117* 154*  < > 116* 112* 102* 120* 86 99 93  BUN 22* 26*  < > 31* 12 18 30* 12 29* 31*  CREATININE 3.27* 3.71*  < > 4.20* 2.77* 3.00* 4.42* 2.59* 4.27* 4.50*  CALCIUM 8.2* 8.2*  --  8.1* 7.9*  --  8.1* 8.5* 8.4*  --   PHOS 2.7 3.1  --  3.1 3.3  --  3.6 2.1* 4.2  --   < > = values in this interval not displayed.   Recent Labs Lab 07/28/16 0540 07/29/16 0332 07/29/16 0724 07/30/16 0604 07/31/16 0538 08/01/16 1016  WBC 12.0* 10.3  --  12.0* 16.0*  --   HGB 8.2* 7.8* 7.5* 7.5* 10.4* 10.5*  HCT 24.1* 23.1* 22.0* 22.1* 30.8* 31.0*  MCV 83.1 83.4  --  83.1 85.3  --   PLT 159 147*  --  150 146*  --    Medications:    . sodium chloride   Intravenous Once  . sodium chloride   Intravenous Once  . amLODipine  5 mg Oral Daily  . aspirin EC  81 mg Oral Daily  . atorvastatin  10 mg Oral q1800  . bisacodyl  10 mg Rectal Daily  . cyclophosphamide  100 mg Oral Daily  . darbepoetin (ARANESP) injection - DIALYSIS  150 mcg Intravenous Q Sat-HD  . feeding supplement  1 Container Oral BID BM  . fluticasone  2 spray Each Nare QHS  . heparin  1,000 Units Intracatheter Once  . loratadine  10 mg Oral Daily  . pantoprazole  40 mg Oral Daily  . polyethylene glycol  17 g Oral Daily  . predniSONE  60 mg Oral Q breakfast  . sodium chloride  2 spray Each Nare TID  . sodium chloride flush  3 mL  Intravenous Q12H  . sulfamethoxazole-trimethoprim  1 tablet Oral Once per day on Mon Wed Fri     Assessment/ Plan:    1. 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   1. s/p alternate day plasmapheresis (started 3/16- has had 8 treatments- last one 3/26)  GBM down from 125 to 33 on 3/21 (prior to TPE on 3/21), 34 on 3/25 (and had  TPE 3/26). I think has had fair trial of TPE so will stop TMTs 2. CTX initiation was delayed (I presume to be  based on the kidney biopsy) - started at 100 daily oral on 3/20. Continue that dose for now, watching for BM suppression 3. At this time, she remains oligoanuric and not showing meaningful signs of renal recovery by labs. 4. Continue dialysis on a TTS schedule--- has spot TTS at [facility].   5. If no return of function doing weekly labs over the next month or so, then bring back for permanent access  (she will be willing if no recovery) 6. Outpt: (at HD) weekly renal panel, weekly CBC to monitor for BM suppression, weekly antiGBM AB.  7. Cytoxan 100/prednisone 60. 8. No heparin with HD for at least a month post renal biopsy date (which was 07/21/16) 2. Hypertension:  blood pressures improved with resumption of antihypertensive therapy/dialysis.  3. Anemia: Likely anemia of chronic disease with recent hospitalizations/vasculitis. She has low iron saturation/high ferritin (1100) but I think a limited Fe load reasonable. (5 doses venofer) On Aranesp 150/week with hemodialysis. Transfused 3/20, 3/24. CBC today pending.   4. Hyperphosphatemia: phos OK no binder-  PTH 109, continue to monitor- no vit D. 5. Upper respiratory symptoms- some sinus sx still present but improved. On cytoxan and steroids. CT scan stable. Will need to F/U with pulmonary   Jamal Maes, MD [place] Pager 08/02/2016, 7:25 AM

## 2016-08-02 NOTE — Progress Notes (Signed)
qPhysical Therapy Treatment Patient Details Name: [name] MRN: [id number] DOB: 12-Mar-1946 Today's Date: 08/02/2016    History of Present Illness [name] is a 71 y.o. female with medical history significant of COPD, esophageal stricture, GERD, hiatal hernia, hyperlipidemia, hypertension, IBS presenting with acute renal failure necessitating aggressive workup and treatment.    PT Comments    Pt limited by reports of fatigue and weakness secondary to dialysis this morning. Pt only agreeable to gait training this session. Pt demonstrating decreased stability with ambulation training, and required HHA plus min guard to min A for stability. Updated equipment needs for home and discussed their use at d/c. Pt agreeable to use. Current recommendations appropriate. Will continue to follow to maximize functional mobility independence.    Follow Up Recommendations  Home health PT;Supervision - Intermittent     Equipment Recommendations  Rolling walker with 5" wheels;3in1 (PT)    Recommendations for Other Services       Precautions / Restrictions Precautions Precautions: None Restrictions Weight Bearing Restrictions: No    Mobility  Bed Mobility               General bed mobility comments: Pt sitting EOB upon entry.   Transfers Overall transfer level: Needs assistance Equipment used: 1 person hand held assist;None Transfers: Sit to/from Stand Sit to Stand: Supervision         General transfer comment: Use of BUE. Supervision for safety   Ambulation/Gait Ambulation/Gait assistance: Min guard;Min assist Ambulation Distance (Feet): 100 Feet Assistive device: 1 person hand held assist Gait Pattern/deviations: Step-through pattern;Drifts right/left;Narrow base of support Gait velocity: Decreased Gait velocity interpretation: Below normal speed for age/gender General Gait Details: Slow, unsteady gait. Pt reporting fatigue and weakness from dialysis. Required  min guard to min A for steadying during gait. Discussed use of RW at home to increase stability and safety with ambulation. Pt agreeable to use.    Stairs            Wheelchair Mobility    Modified Rankin (Stroke Patients Only)       Balance Overall balance assessment: Needs assistance Sitting-balance support: No upper extremity supported Sitting balance-Leahy Scale: Good     Standing balance support: Single extremity supported;During functional activity Standing balance-Leahy Scale: Poor Standing balance comment: HHA and min guard to min A for stability during functional activity                            Cognition Arousal/Alertness: Awake/alert Behavior During Therapy: WFL for tasks assessed/performed Overall Cognitive Status: Within Functional Limits for tasks assessed                                        Exercises      General Comments General comments (skin integrity, edema, etc.): Pt only agreeable to walking this session. Discussed follow up HHPT recommendations with pt and educated about HHPT purpose. Educated about equipment recommendations to increase safety. Discussed using 3in1 as a shower seat and sitting down during bathing to increase safety at home.       Pertinent Vitals/Pain Pain Assessment: Faces Faces Pain Scale: No hurt    Home Living                      Prior Function  PT Goals (current goals can now be found in the care plan section) Acute Rehab PT Goals Patient Stated Goal: Get back home PT Goal Formulation: With patient Time For Goal Achievement: 08/10/16 Potential to Achieve Goals: Good Progress towards PT goals: Progressing toward goals    Frequency    Min 3X/week      PT Plan Current plan remains appropriate    Co-evaluation             End of Session Equipment Utilized During Treatment: Gait belt Activity Tolerance: Patient limited by fatigue Patient left: in  bed;with call bell/phone within reach (sitting EOB ) Nurse Communication: Mobility status PT Visit Diagnosis: Unsteadiness on feet (R26.81);Muscle weakness (generalized) (M62.81)     Time: 5537-4827 PT Time Calculation (min) (ACUTE ONLY): 10 min  Charges:  $Gait Training: 8-22 mins                    G Codes:       Nicky Pugh, PT, DPT  Acute Rehabilitation Services  Pager: [phone]   Army Melia 08/02/2016, 12:22 PM

## 2016-08-03 LAB — CBC
HEMATOCRIT: 28.6 % — AB (ref 36.0–46.0)
HEMOGLOBIN: 9.9 g/dL — AB (ref 12.0–15.0)
MCH: 30.4 pg (ref 26.0–34.0)
MCHC: 34.6 g/dL (ref 30.0–36.0)
MCV: 87.7 fL (ref 78.0–100.0)
Platelets: 54 10*3/uL — ABNORMAL LOW (ref 150–400)
RBC: 3.26 MIL/uL — AB (ref 3.87–5.11)
RDW: 17.6 % — ABNORMAL HIGH (ref 11.5–15.5)
WBC: 9.2 10*3/uL (ref 4.0–10.5)

## 2016-08-03 LAB — RENAL FUNCTION PANEL
ANION GAP: 11 (ref 5–15)
Albumin: 3.7 g/dL (ref 3.5–5.0)
BUN: 23 mg/dL — ABNORMAL HIGH (ref 6–20)
CO2: 24 mmol/L (ref 22–32)
Calcium: 8.3 mg/dL — ABNORMAL LOW (ref 8.9–10.3)
Chloride: 99 mmol/L — ABNORMAL LOW (ref 101–111)
Creatinine, Ser: 3.63 mg/dL — ABNORMAL HIGH (ref 0.44–1.00)
GFR calc non Af Amer: 12 mL/min — ABNORMAL LOW (ref >60)
GFR, EST AFRICAN AMERICAN: 13 mL/min — AB (ref >60)
GLUCOSE: 110 mg/dL — AB (ref 65–99)
PHOSPHORUS: 3.8 mg/dL (ref 2.5–4.6)
Potassium: 4.1 mmol/L (ref 3.5–5.1)
Sodium: 134 mmol/L — ABNORMAL LOW (ref 135–145)

## 2016-08-03 MED ORDER — ALBUMIN HUMAN 25 % IV SOLN
INTRAVENOUS | Status: DC
  Filled 2016-08-03 (×3): qty 200

## 2016-08-03 MED ORDER — SODIUM CHLORIDE 0.9 % IV SOLN
4.0000 g | Freq: Once | INTRAVENOUS | Status: DC
  Filled 2016-08-03: qty 40

## 2016-08-03 MED ORDER — ACD FORMULA A 0.73-2.45-2.2 GM/100ML VI SOLN
500.0000 mL | Status: DC
  Filled 2016-08-03: qty 500

## 2016-08-03 MED ORDER — ACETAMINOPHEN 325 MG PO TABS: 650.0000 mg | ORAL_TABLET | ORAL | Status: DC | PRN

## 2016-08-03 MED ORDER — HEPARIN SODIUM (PORCINE) 1000 UNIT/ML IJ SOLN: 1000.0000 [IU] | Freq: Once | INTRAMUSCULAR | Status: DC

## 2016-08-03 MED ORDER — DIPHENHYDRAMINE HCL 25 MG PO CAPS: 25.0000 mg | ORAL_CAPSULE | Freq: Four times a day (QID) | ORAL | Status: DC | PRN

## 2016-08-03 MED ORDER — CALCIUM CARBONATE ANTACID 500 MG PO CHEW: 2.0000 | CHEWABLE_TABLET | ORAL | Status: DC

## 2016-08-03 NOTE — Progress Notes (Signed)
Patient ID: [name], female   DOB: 23-Dec-1945, 71 y.o.   MRN: [id number] [facility] Progress Note    Subjective:    Had TPE #8 3/26 (last one) AntiGBM Ab 33-35 X 2 determinations (from a high of 123 pre TPE) HD yesterday and due again for tomorrow Has become thrombocytopenic and we have stopped her cytoxan as of today No bleeding   Objective:   BP 125/63 (BP Location: Right Arm)   Pulse 66   Temp 99.4 F (37.4 C) (Oral)   Resp 17   Ht 5\' 3"  (1.6 m)   Wt 54.9 kg (121 lb 0.5 oz)   SpO2 100%   BMI 21.44 kg/m   Intake/Output Summary (Last 24 hours) at 08/03/16 1624 Last data filed at 08/03/16 1433  Gross per 24 hour  Intake              483 ml  Output                0 ml  Net              483 ml   Weight change: -0.9 kg (-1 lb 15.7 oz)  Physical Exam: Comfortably resting in bed  VS as noted Right IJ PC  Regular rhythm, normal S1 and S2 Clear to auscultation, no rales/rhonchi Abd Soft, flat, nontender No lower extremity edema  No results found.  Recent Labs Lab 07/28/16 0540 07/29/16 0332 07/29/16 0724 07/30/16 0604 07/31/16 0538 08/01/16 0630 08/01/16 1016 08/02/16 0602 08/03/16 0702  NA 138 136 136 137 137 137 136 137 134*  K 3.9 3.7 3.5 3.5 3.5 4.1 3.8 4.1 4.1  CL 103 98* 95* 104 98* 100* 96* 103 99*  CO2 25 27  --  22 28 25   --  19* 24  GLUCOSE 116* 112* 102* 120* 86 99 93 91 110*  BUN 31* 12 18 30* 12 29* 31* 42* 23*  CREATININE 4.20* 2.77* 3.00* 4.42* 2.59* 4.27* 4.50* 5.35* 3.63*  CALCIUM 8.1* 7.9*  --  8.1* 8.5* 8.4*  --  8.5* 8.3*  PHOS 3.1 3.3  --  3.6 2.1* 4.2  --  4.9* 3.8     Recent Labs Lab 07/30/16 0604 07/31/16 0538 08/01/16 1016 08/02/16 0737 08/03/16 0702  WBC 12.0* 16.0*  --  12.8* 9.2  HGB 7.5* 10.4* 10.5* 9.5* 9.9*  HCT 22.1* 30.8* 31.0* 27.5* 28.6*  MCV 83.1 85.3  --  87.6 87.7  PLT 150 146*  --  72* 54*   Medications:    . sodium chloride   Intravenous Once  . sodium chloride   Intravenous  Once  . amLODipine  5 mg Oral Daily  . aspirin EC  81 mg Oral Daily  . atorvastatin  10 mg Oral q1800  . bisacodyl  10 mg Rectal Daily  . darbepoetin (ARANESP) injection - DIALYSIS  150 mcg Intravenous Q Sat-HD  . feeding supplement  1 Container Oral BID BM  . fluticasone  2 spray Each Nare QHS  . loratadine  10 mg Oral Daily  . pantoprazole  40 mg Oral Daily  . polyethylene glycol  17 g Oral Daily  . predniSONE  60 mg Oral Q breakfast  . sodium chloride  2 spray Each Nare TID  . sodium chloride flush  3 mL Intravenous Q12H  . sulfamethoxazole-trimethoprim  1 tablet Oral Once per day on Mon Wed Fri     Assessment/ Plan:    1. 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   1. s/p alternate day plasmapheresis (started 3/16- has had 8 treatments- last one 3/26)  GBM down from 125 to 33 on 3/21 (prior to TPE on 3/21), 34 on 3/25 (and had TPE 3/26). I think has had fair trial of TPE so will stop TMTs 2. CTX initiation was delayed (I presume to be based on the kidney biopsy) - started at 100 daily oral on 3/20.  3. New/worsening thrombocytopenia has necessitated stopping cytoxan as of 3/28 4. At this time, she remains oligoanuric and not showing meaningful signs of renal recovery by labs. 5. Continue dialysis on a TTS schedule--- has spot TTS at [facility].   6. If no return of function doing weekly labs over the next month or so, then bring back for permanent access  (she will be willing if no recovery) 7. Outpt: (at HD) weekly renal panel, weekly CBC to monitor for BM suppression, weekly antiGBM AB.  8. No heparin with HD for at least a month post renal biopsy date (which was 07/21/16) and of course while platelets low  2. Hypertension:  blood pressures improved with resumption of antihypertensive  therapy/dialysis.  3. Anemia: Likely anemia of chronic disease with recent hospitalizations/vasculitis. She has low iron saturation/high ferritin (1100) but I think a limited Fe load reasonable. (5 doses venofer) On Aranesp 150/week with hemodialysis. Transfused 3/20, 3/24. CBC today pending.   4. Hyperphosphatemia: phos OK no binder-  PTH 109, continue to monitor- no vit D. 5. Upper respiratory symptoms- some sinus sx still present but improved. On cytoxan and steroids. CT scan stable. Will need to F/U with pulmonary   Jamal Maes, MD [place] Pager 08/03/2016, 4:24 PM

## 2016-08-03 NOTE — Progress Notes (Signed)
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 Platelets continue to decrease today to 54.  Plan:  Per Dr. Lorrene Reid, hold cyclophosphamide. Monitor platelets daily to assess ability to re-start.   Gwenlyn Perking, PharmD PGY1 Pharmacy Resident Pager: [phone] 08/03/2016 11:10 AM

## 2016-08-03 NOTE — Progress Notes (Signed)
PROGRESS NOTE                                                                                                                                                                                                             Patient Demographics:    [name], is a 71 y.o. female, DOB - 1946-04-01, RAX:[id number]  Admit date - 07/20/2016   Admitting Physician Waldemar Dickens, MD  Outpatient Primary MD for the patient is [facility], MD  LOS - 14  Outpatient Specialists: NONE  No chief complaint on file.      Brief Narrative   71 y.o.femalewith medical history significant for COPD, esophageal stricture, GERD, hiatal hernia, hyperlipidemia, hypertension, IBS, cervical disc disease and history of lung nodules with extensive workup including lung biopsy ruling out Sjogren's disease. Pt has had recurrent history of sinusitis since last fall, treated with several rounds of antibiotics and corticosteroids which have not resulted in significant clinical improvement. She was hospitalized 2 months ago for fever/weight loss and relative hypotension with anorexia and night sweats and was treated for community-acquired pneumonia. She has continued to have sinus drainage with some epistaxis and bloody secretions and also had nausea with dysgeusia for the past few weeks or so. Pt also reported severe muscle pain and leg cramps over the same time period. Of note, pt is followed by Dr. Elsworth Soho for abnormal CT imaging &pulmonary nodules. Work up for this has included an open lung biopsy in 11/2008 in the setting of fever/night sweats and CT scan with pulmonary nodules which showed possible inflammatory pseudotumor vs reaction to old bronchopneumonia associated with some carcinoid tumorlets. Salivary  gland biospy 03/2009 was negative for Sjogren's disease and negative for autoimmune work up. Serial CT's were planned but she was lost to follow  up. She had a repeat CT of the chest in 06/2016 which showed volume loss, post surgical changes of the left hemithorax and RLL pulmonary nodules.   In regards to pt's creatinine, in January of 2018 was 1.7 and in February 2018 it ranged between 1.6 and 2.1 with a peak of 2.8 that prompted referral to nephrology. After evaluation 07/20/16, she was found to have an elevated creatinine level of 12.4 and given the high risk of suspicion for vasculitis pt was directly admitted to hospital for evaluation.  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 She was on solumedrol and then switched to PO prednisone. She is required to have plasmapheresis on alternate days for 2 weeks (started 07/22/2016).   Subjective:   Pt reports no new complaints to me.   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 Renal started on Cyclophosphamide 100 mg PO QD.  - She has outpt HD established ,  TTS. ( has spot at [facility]) - Nephrology managing.  - repeat anti GBM of 34.  -has outpatient hemodialysis established. renal want to wait a little before perm access .( If no return to renal function in next 4 weeks).  Essential hypertension - Continue Norvasc  -Improved.  Anemia of chronic renal disease -Aranesp when necessary with dialysis. Significantly  improved hemoglobin today.  Hyperphosphatemia - Low phosphorus  diet, monitor with hemodialysis - PTH level 109  Thrombocytopenia Mild. Monitor  Leukocytosis Possibly due to steroid. No signs of infection.  Dyslipidemia - Continue Lipitor   RLL pulmonary nodules - Pulm recommended prophylactic bactrim while on immunosuppresion - Repeat CT imaging done this admission shows unchanged pulmonary nodule. If comparative CT in 2 months unchanged and no evidence of renal recover, doubt that there is benefit of continued therapy. - Monitor for hemoptysis - Follow up with Dr. Elsworth Soho at discharge >had prior PET arranged for May 2018 to review pulmonary nodules .   Constipation Not relieved with  Dulcolax suppository and MiraLAX. Ordered tap water enema.    Code Status : Full code  Family Communication  : None at bedside  Disposition Plan  : Home  once no further plasma exchange needed   Barriers For Discharge : Active symptoms, once cleared by Nephrology for d/c  Consults  :   Nephrology Pulmonary IR    Procedures    RIJ cathter for HD placed 07/20/2016  Renal biopsy 07/21/2016  Plasmapheresis 07/22/16  HD TTS  Cytoxan - started 07/27/16  DVT Prophylaxis  :  SCDs ( no heparin for 1 month post renal bx)  Lab Results  Component Value Date   PLT 54 (L) 08/03/2016    Antibiotics  :    Anti-infectives    Start     Dose/Rate Route Frequency Ordered Stop   07/27/16 0900  sulfamethoxazole-trimethoprim (BACTRIM DS,SEPTRA DS) 800-160 MG per tablet 1 tablet     1 tablet Oral Once per day on Mon Wed Fri 07/26/16 1639     07/20/16 1430  ceFAZolin (ANCEF) IVPB 2g/100 mL premix     2 g 200 mL/hr over 30 Minutes Intravenous To Radiology 07/20/16 1424 07/20/16 1641        Objective:   Vitals:   08/02/16 1755 08/02/16 1950 08/03/16 0513 08/03/16 0946  BP: (!) 145/59 (!) 153/72 (!) 150/58 125/63  Pulse: 69 71 66 66  Resp: '18 18 18 17  ' Temp: 98.8 F (37.1 C) 98 F (36.7 C) 98.8 F (37.1 C) 99.4 F (  37.4 C)  TempSrc: Oral Oral  Oral Oral  SpO2: 100% 100% 100% 100%  Weight:      Height:        Wt Readings from Last 3 Encounters:  08/02/16 54.9 kg (121 lb 0.5 oz)  06/29/16 65.8 kg (145 lb)  06/19/16 64 kg (141 lb)     Intake/Output Summary (Last 24 hours) at 08/03/16 1344 Last data filed at 08/03/16 1100  Gross per 24 hour  Intake              243 ml  Output                0 ml  Net              243 ml     Physical Exam  Gen: alert and awake, in nad. HEENT: moist mucosa,  rt temp HD catheter, atraumatic, normocephalic Chest: clear b/l, no wheezes, equal chest rise. CVS: N S1&S2, no murmurs,  GI: soft, NT, ND, Musculoskeletal: warm, no edema     Data Review:    CBC  Recent Labs Lab 07/29/16 0332  07/30/16 0604 07/31/16 0538 08/01/16 1016 08/02/16 0737 08/03/16 0702  WBC 10.3  --  12.0* 16.0*  --  12.8* 9.2  HGB 7.8*  < > 7.5* 10.4* 10.5* 9.5* 9.9*  HCT 23.1*  < > 22.1* 30.8* 31.0* 27.5* 28.6*  PLT 147*  --  150 146*  --  72* 54*  MCV 83.4  --  83.1 85.3  --  87.6 87.7  MCH 28.2  --  28.2 28.8  --  30.3 30.4  MCHC 33.8  --  33.9 33.8  --  34.5 34.6  RDW 16.0*  --  16.3* 15.8*  --  17.4* 17.6*  < > = values in this interval not displayed.  Chemistries   Recent Labs Lab 07/30/16 0604 07/31/16 0538 08/01/16 0630 08/01/16 1016 08/02/16 0602 08/03/16 0702  NA 137 137 137 136 137 134*  K 3.5 3.5 4.1 3.8 4.1 4.1  CL 104 98* 100* 96* 103 99*  CO2 '22 28 25  ' --  19* 24  GLUCOSE 120* 86 99 93 91 110*  BUN 30* 12 29* 31* 42* 23*  CREATININE 4.42* 2.59* 4.27* 4.50* 5.35* 3.63*  CALCIUM 8.1* 8.5* 8.4*  --  8.5* 8.3*   ------------------------------------------------------------------------------------------------------------------ No results for input(s): CHOL, HDL, LDLCALC, TRIG, CHOLHDL, LDLDIRECT in the last 72 hours.  Lab Results  Component Value Date   HGBA1C 5.8 02/02/2010    ------------------------------------------------------------------------------------------------------------------ No results for input(s): TSH, T4TOTAL, T3FREE, THYROIDAB in the last 72 hours.  Invalid input(s): FREET3 ------------------------------------------------------------------------------------------------------------------ No results for input(s): VITAMINB12, FOLATE, FERRITIN, TIBC, IRON, RETICCTPCT in the last 72 hours.  Coagulation profile No results for input(s): INR, PROTIME in the last 168 hours.  No results for input(s): DDIMER in the last 72 hours.  Cardiac Enzymes No results for input(s): CKMB, TROPONINI, MYOGLOBIN in the last 168 hours.  Invalid input(s): CK ------------------------------------------------------------------------------------------------------------------ No results found for: BNP  Inpatient Medications  Scheduled Meds: . sodium chloride   Intravenous Once  . sodium chloride   Intravenous Once  . amLODipine  5 mg Oral Daily  . aspirin EC  81 mg Oral Daily  . atorvastatin  10 mg Oral q1800  . bisacodyl  10 mg Rectal Daily  . darbepoetin (ARANESP) injection - DIALYSIS  150 mcg Intravenous Q Sat-HD  . feeding supplement  1 Container Oral BID BM  . fluticasone  2  spray Each Nare QHS  . loratadine  10 mg Oral Daily  . pantoprazole  40 mg Oral Daily  . polyethylene glycol  17 g Oral Daily  . predniSONE  60 mg Oral Q breakfast  . sodium chloride  2 spray Each Nare TID  . sodium chloride flush  3 mL Intravenous Q12H  . sulfamethoxazole-trimethoprim  1 tablet Oral Once per day on Mon Wed Fri   Continuous Infusions:  PRN Meds:.sodium chloride, calcium carbonate, methocarbamol (ROBAXIN)  IV, ondansetron **OR** ondansetron (ZOFRAN) IV, senna, sodium chloride flush  Micro Results No results found for this or any previous visit (from the past 240 hour(s)).  Radiology Reports Ct Chest Wo Contrast  Result Date: 07/29/2016 CLINICAL DATA:   71 year old female inpatient admitted for 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 Longstanding history of indeterminate pulmonary nodules, described as inflammatory pseudotumors on surgical lung biopsy of the left lung on 11/06/2008. EXAM: CT CHEST WITHOUT CONTRAST TECHNIQUE: Multidetector CT imaging of the chest was performed following the standard protocol without IV contrast. COMPARISON:  06/20/2016 chest CT.  07/26/2016 chest radiograph. FINDINGS: Cardiovascular: Top-normal heart size . No significant pericardial fluid/thickening. Right internal jugular dialysis catheter terminates at the cavoatrial junction. Atherosclerotic nonaneurysmal thoracic aorta. Stable dilated pulmonary arteries (main pulmonary artery diameter 3.4 cm). Mediastinum/Nodes: Stable heterogeneous thyroid gland with possible confluent small hypodense thyroid nodules. 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 No pathologically enlarged axillary, mediastinal or gross hilar lymph nodes, noting limited sensitivity for the detection of hilar adenopathy on this noncontrast study. Lungs/Pleura: No pneumothorax. New trace dependent bilateral pleural effusions. Stable postsurgical changes from wedge resection in the left upper and left lower lobes. Mild centrilobular and paraseptal emphysema with mild diffuse bronchial wall thickening. No acute consolidative airspace disease or new significant pulmonary nodules. Stable parenchymal band in the posterior right upper lobe with associated distortion, compatible with postinfectious/postinflammatory scarring. Stable chronic thickening of the peribronchovascular interstitium throughout both lungs with associated chronic mild distortion. Scattered irregular pulmonary nodules in the right lung are unchanged in the interval and not convincingly changed back to 12/04/2009 chest CT,  compatible with benign inflammatory nodules. For example a 1.5 x 1.0 cm peripheral right upper lobe nodule (series 4/ image 41), measuring 1.5 x 1.0 cm on 06/20/2016 and 1.5 x 0.9 cm on 12/04/2009. Partially cystic irregular right lower lobe 2.2 x 1.7 cm nodule, measuring 2.3 x 1.8 cm on 06/20/2016 and 2.3 x 1.9 cm on 12/04/2009. Upper abdomen: Unremarkable. Musculoskeletal: No aggressive appearing focal osseous lesions. Mild thoracic spondylosis. New mild anasarca. IMPRESSION: 1. New mild anasarca and trace dependent bilateral pleural effusions. Top-normal heart size. 2. Scattered irregular pulmonary nodules in the right lung are stable in the interval and not convincingly changed back to 2011, compatible with benign inflammatory nodules given the results of the 2010 surgical left lung biopsy. 3. Stable postsurgical and postinflammatory scarring in both lungs. No acute pulmonary disease. 4. Mild emphysema with mild diffuse bronchial wall thickening, suggesting COPD. 5. Aortic atherosclerosis. 6. Stable dilated main pulmonary artery, suggesting pulmonary arterial hypertension. Electronically Signed   By: Ilona Sorrel M.D.   On: 07/29/2016 15:48   US Biopsy  Result Date: 07/21/2016 INDICATION: Acute renal failure EXAM: ULTRASOUND-GUIDED BIOPSY OF THE RENAL CORTEX.  RANDOM CORE. MEDICATIONS: None. ANESTHESIA/SEDATION: Fentanyl 50 mcg IV; Versed 1 mg IV Moderate Sedation Time:  10 The patient was continuously  monitored during the procedure by the interventional radiology nurse under my direct supervision. FLUOROSCOPY TIME:  None. COMPLICATIONS: None immediate. PROCEDURE: Informed written consent was obtained from the patient after a thorough discussion of the procedural risks, benefits and alternatives. All questions were addressed. Maximal Sterile Barrier Technique was utilized including caps, mask, sterile gowns, sterile gloves, sterile drape, hand hygiene and skin antiseptic. A timeout was performed prior to the  initiation of the procedure. The back was prepped with ChloraPrep in a sterile fashion, and a sterile drape was applied covering the operative field. A sterile gown and sterile gloves were used for the procedure. Under sonographic guidance, 2 16 gauge core biopsies of the cortex of the lower pole of the left kidney were obtained. Final imaging was performed. Patient tolerated the procedure well without complication. Vital sign monitoring by nursing staff during the procedure will continue as patient is in the special procedures unit for post procedure observation. FINDINGS: The images document guide needle placement within the left lower pole renal cortex. Post biopsy images demonstrate no evidence of hemorrhage. IMPRESSION: Successful ultrasound-guided random renal cortex core biopsy. Electronically Signed   By: Marybelle Killings M.D.   On: 07/21/2016 14:36   Ir Fluoro Guide Cv Line Right  Result Date: 07/20/2016 INDICATION: End-stage renal disease. In need of durable intravenous access for the initiation of dialysis. EXAM: TUNNELED CENTRAL VENOUS HEMODIALYSIS CATHETER PLACEMENT WITH ULTRASOUND AND FLUOROSCOPIC GUIDANCE MEDICATIONS: Ancef 2 gm IV . The antibiotic was given in an appropriate time interval prior to skin puncture. ANESTHESIA/SEDATION: Versed 1 mg IV; Fentanyl 50 mcg IV; Moderate Sedation Time:  17 minutes The patient was continuously monitored during the procedure by the interventional radiology nurse under my direct supervision. FLUOROSCOPY TIME:  Fluoroscopy Time: 12 seconds (2 mGy). COMPLICATIONS: None immediate. PROCEDURE: Informed written consent was obtained from the patient after a discussion of the risks, benefits, and alternatives to treatment. Questions regarding the procedure were encouraged and answered. The right neck and chest were prepped with chlorhexidine in a sterile fashion, and a sterile drape was applied covering the operative field. Maximum barrier sterile technique with sterile  gowns and gloves were used for the procedure. A timeout was performed prior to the initiation of the procedure. After creating a small venotomy incision, a micropuncture kit was utilized to access the right internal jugular vein under direct, real-time ultrasound guidance after the overlying soft tissues were anesthetized with 1% lidocaine with epinephrine. Ultrasound image documentation was performed. The microwire was kinked to measure appropriate catheter length. A stiff Glidewire was advanced to the level of the IVC and the micropuncture sheath was exchanged for a peel-away sheath. A Palindrome tunneled hemodialysis catheter measuring 19 cm from tip to cuff was tunneled in a retrograde fashion from the anterior chest wall to the venotomy incision. The catheter was then placed through the peel-away sheath with tips ultimately positioned within the superior aspect of the right atrium. Final catheter positioning was confirmed and documented with a spot radiographic image. The catheter aspirates and flushes normally. The catheter was flushed with appropriate volume heparin dwells. The catheter exit site was secured with a 0-Prolene retention suture. The venotomy incision was closed with an interrupted 4-0 Vicryl, Dermabond and Steri-strips. Dressings were applied. The patient tolerated the procedure well without immediate post procedural complication. IMPRESSION: Successful placement of 19 cm tip to cuff tunneled hemodialysis catheter via the right internal jugular vein with tips terminating within the superior aspect of the right atrium. The catheter is  ready for immediate use. Electronically Signed   By: [name] M.D.   On: 07/20/2016 17:12   Ir US Guide Vasc Access Right  Result Date: 07/20/2016 INDICATION: End-stage renal disease. In need of durable intravenous access for the initiation of dialysis. EXAM: TUNNELED CENTRAL VENOUS HEMODIALYSIS CATHETER PLACEMENT WITH ULTRASOUND AND FLUOROSCOPIC GUIDANCE  MEDICATIONS: Ancef 2 gm IV . The antibiotic was given in an appropriate time interval prior to skin puncture. ANESTHESIA/SEDATION: Versed 1 mg IV; Fentanyl 50 mcg IV; Moderate Sedation Time:  17 minutes The patient was continuously monitored during the procedure by the interventional radiology nurse under my direct supervision. FLUOROSCOPY TIME:  Fluoroscopy Time: 12 seconds (2 mGy). COMPLICATIONS: None immediate. PROCEDURE: Informed written consent was obtained from the patient after a discussion of the risks, benefits, and alternatives to treatment. Questions regarding the procedure were encouraged and answered. The right neck and chest were prepped with chlorhexidine in a sterile fashion, and a sterile drape was applied covering the operative field. Maximum barrier sterile technique with sterile gowns and gloves were used for the procedure. A timeout was performed prior to the initiation of the procedure. After creating a small venotomy incision, a micropuncture kit was utilized to access the right internal jugular vein under direct, real-time ultrasound guidance after the overlying soft tissues were anesthetized with 1% lidocaine with epinephrine. Ultrasound image documentation was performed. The microwire was kinked to measure appropriate catheter length. A stiff Glidewire was advanced to the level of the IVC and the micropuncture sheath was exchanged for a peel-away sheath. A Palindrome tunneled hemodialysis catheter measuring 19 cm from tip to cuff was tunneled in a retrograde fashion from the anterior chest wall to the venotomy incision. The catheter was then placed through the peel-away sheath with tips ultimately positioned within the superior aspect of the right atrium. Final catheter positioning was confirmed and documented with a spot radiographic image. The catheter aspirates and flushes normally. The catheter was flushed with appropriate volume heparin dwells. The catheter exit site was secured with a  0-Prolene retention suture. The venotomy incision was closed with an interrupted 4-0 Vicryl, Dermabond and Steri-strips. Dressings were applied. The patient tolerated the procedure well without immediate post procedural complication. IMPRESSION: Successful placement of 19 cm tip to cuff tunneled hemodialysis catheter via the right internal jugular vein with tips terminating within the superior aspect of the right atrium. The catheter is ready for immediate use. Electronically Signed   By: [name] M.D.   On: 07/20/2016 17:12   Dg Chest Port 1 View  Result Date: 07/26/2016 CLINICAL DATA:  Pulmonary nodules EXAM: PORTABLE CHEST 1 VIEW COMPARISON:  06/19/2016, chest CT 06/20/2016 FINDINGS: Right dialysis catheter is in place with the tip at the cavoatrial junction. Postoperative changes on the left. Areas of scarring and nodularity in the left lung are stable. Decreasing airspace opacities in the right lung with some residual areas of nodularity. Heart is borderline in size. No visible effusions. IMPRESSION: Stable scarring and nodularity in the left lung with postoperative changes. Improving areas of nodularity in the right lung. Electronically Signed   By: Rolm Baptise M.D.   On: 07/26/2016 16:30   Ct Maxillofacial Wo Contrast  Result Date: 07/20/2016 CLINICAL DATA:  71 y/o  F; recurrent sinusitis. EXAM: CT MAXILLOFACIAL WITHOUT CONTRAST TECHNIQUE: Multidetector CT imaging of the maxillofacial structures was performed. Multiplanar CT image reconstructions were also generated. A small metallic BB was placed on the right temple in order to reliably differentiate right  from left. COMPARISON:  03/13/2016 MRI head.  12/02/2015 CT head. FINDINGS: Frontal sinus: Normally aerated. Patent frontal sinus drainage pathways. Ethmoid sinus: Normally aerated. Maxillary sinuses:  Normally aerated. Sphenoid sinus: Normally aerated. Patent sphenoid ethmoidal recesses. Right ostiomeatal unit:  Patent. Left ostiomeatal unit:   Patent. Nasal passages: Patent. Intact nasal septum. Mild leftward nasal septal deviation and small spur. Other: Orbits and intracranial compartment are unremarkable. Mastoid air cells are normally pneumatized. IMPRESSION: Normally aerated paranasal sinuses and patent sinus drainage pathways. Electronically Signed   By: Kristine Garbe M.D.   On: 07/20/2016 14:22    Time Spent in minutes  25   Velvet Bathe M.D on 08/03/2016 at 1:44 PM  Between 7am to 7pm - Pager - [phone]  After 7pm go to [email or web address] - password [facility]  [facility] -  Office  [phone]

## 2016-08-04 LAB — CBC
HCT: 26.9 % — ABNORMAL LOW (ref 36.0–46.0)
Hemoglobin: 9.3 g/dL — ABNORMAL LOW (ref 12.0–15.0)
MCH: 29.9 pg (ref 26.0–34.0)
MCHC: 34.6 g/dL (ref 30.0–36.0)
MCV: 86.5 fL (ref 78.0–100.0)
PLATELETS: 62 10*3/uL — AB (ref 150–400)
RBC: 3.11 MIL/uL — AB (ref 3.87–5.11)
RDW: 18 % — AB (ref 11.5–15.5)
WBC: 13.9 10*3/uL — AB (ref 4.0–10.5)

## 2016-08-04 LAB — RENAL FUNCTION PANEL
ALBUMIN: 3.3 g/dL — AB (ref 3.5–5.0)
Anion gap: 9 (ref 5–15)
BUN: 40 mg/dL — AB (ref 6–20)
CALCIUM: 8.4 mg/dL — AB (ref 8.9–10.3)
CHLORIDE: 100 mmol/L — AB (ref 101–111)
CO2: 25 mmol/L (ref 22–32)
CREATININE: 5.15 mg/dL — AB (ref 0.44–1.00)
GFR, EST AFRICAN AMERICAN: 9 mL/min — AB (ref >60)
GFR, EST NON AFRICAN AMERICAN: 8 mL/min — AB (ref >60)
Glucose, Bld: 132 mg/dL — ABNORMAL HIGH (ref 65–99)
PHOSPHORUS: 2.8 mg/dL (ref 2.5–4.6)
Potassium: 3.9 mmol/L (ref 3.5–5.1)
Sodium: 134 mmol/L — ABNORMAL LOW (ref 135–145)

## 2016-08-04 MED ORDER — SODIUM CHLORIDE 0.9 % IV SOLN
125.0000 mg | INTRAVENOUS | Status: DC
  Administered 2016-08-06 – 2016-08-13 (×4): 125 mg via INTRAVENOUS
  Filled 2016-08-04 (×8): qty 10

## 2016-08-04 NOTE — Progress Notes (Signed)
PT Cancellation Note  Patient Details Name: [name] MRN: [id number] DOB: [birth year]   Cancelled Treatment:    Reason Eval/Treat Not Completed: Patient at procedure or test/unavailable Pt currently in HD. Will reattempt in afternoon as schedule allows. If not able to get to pt in afternoon, will attempt tomorrow.   Nicky Pugh, PT, DPT  Acute Rehabilitation Services  Pager: [place] 08/04/2016, 7:28 AM

## 2016-08-04 NOTE — Progress Notes (Signed)
Patient ID: [name], female   DOB: 10-26-1945, 71 y.o.   MRN: [id number] [facility] Progress Note    Subjective:    Had TPE #8 3/26 (last one) AntiGBM Ab 33-35 X 2 determinations (from a high of 123 pre TPE) Had HD today (has outpt TTS spot at [facility]) - kept even Has become thrombocytopenic and we have stopped her cytoxan as of 3/28 No bleeding noted   Objective:   BP (!) 150/65 (BP Location: Right Arm)   Pulse 71   Temp 98.7 F (37.1 C) (Oral)   Resp 16   Ht 5\' 3"  (1.6 m)   Wt 55.1 kg (121 lb 7.6 oz) Comment: standing weight  SpO2 100%   BMI 21.52 kg/m   Intake/Output Summary (Last 24 hours) at 08/04/16 1239 Last data filed at 08/04/16 1039  Gross per 24 hour  Intake              720 ml  Output              100 ml  Net              620 ml   Weight change: -0.952 kg (-2 lb 1.6 oz)  Physical Exam: Comfortably resting in bed  VS as noted Right IJ PC  Regular rhythm, normal S1 and S2 Clear to auscultation, no rales/rhonchi Abd Soft, flat, nontender No lower extremity edema  Recent Labs Lab 07/29/16 0332  07/30/16 0604 07/31/16 0538 08/01/16 0630 08/01/16 1016 08/02/16 0602 08/03/16 0702 08/04/16 0539  NA 136  < > 137 137 137 136 137 134* 134*  K 3.7  < > 3.5 3.5 4.1 3.8 4.1 4.1 3.9  CL 98*  < > 104 98* 100* 96* 103 99* 100*  CO2 27  --  22 28 25   --  19* 24 25  GLUCOSE 112*  < > 120* 86 99 93 91 110* 132*  BUN 12  < > 30* 12 29* 31* 42* 23* 40*  CREATININE 2.77*  < > 4.42* 2.59* 4.27* 4.50* 5.35* 3.63* 5.15*  CALCIUM 7.9*  --  8.1* 8.5* 8.4*  --  8.5* 8.3* 8.4*  PHOS 3.3  --  3.6 2.1* 4.2  --  4.9* 3.8 2.8  < > = values in this interval not displayed.   Recent Labs Lab 07/31/16 0538 08/01/16 1016 08/02/16 0737 08/03/16 0702 08/04/16 0731  WBC 16.0*  --  12.8* 9.2 13.9*  HGB 10.4* 10.5* 9.5* 9.9* 9.3*  HCT 30.8* 31.0* 27.5* 28.6* 26.9*  MCV 85.3  --  87.6 87.7 86.5  PLT 146*  --  72* 54* 62*   Medications:    . sodium  chloride   Intravenous Once  . sodium chloride   Intravenous Once  . amLODipine  5 mg Oral Daily  . aspirin EC  81 mg Oral Daily  . atorvastatin  10 mg Oral q1800  . bisacodyl  10 mg Rectal Daily  . darbepoetin (ARANESP) injection - DIALYSIS  150 mcg Intravenous Q Sat-HD  . feeding supplement  1 Container Oral BID BM  . fluticasone  2 spray Each Nare QHS  . loratadine  10 mg Oral Daily  . pantoprazole  40 mg Oral Daily  . polyethylene glycol  17 g Oral Daily  . predniSONE  60 mg Oral Q breakfast  . sodium chloride  2 spray Each Nare TID  . sodium chloride flush  3 mL Intravenous Q12H  . sulfamethoxazole-trimethoprim  1 tablet Oral Once per day on Mon Wed Fri     Assessment/ Plan:    1. 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   1. s/p alternate day plasmapheresis (started 3/16- has had 8 treatments- last one 3/26)  GBM down from 125 to 33 on 3/21 (prior to TPE on 3/21), 34 on 3/25 (and had TPE 3/26). I think has had fair trial of TPE so will stop TMTs 2. CTX initiation was delayed (I presume to be based on the kidney biopsy) - started at 100 daily oral on 3/20.  3. New/worsening thrombocytopenia has necessitated stopping cytoxan  as of 3/28 4. At this time, she remains oligoanuric and not showing meaningful signs of renal recovery by labs. 5. Continue dialysis on a TTS schedule--- has spot TTS at [facility].   6. If no return of function doing weekly labs over the next month or so, then bring back for permanent access  (she will be willing if no recovery) 7. Outpt: (at HD) weekly renal panel, weekly CBC to monitor for BM suppression, weekly antiGBM AB.  8. No heparin with HD for at least a month post renal biopsy date (which was 07/21/16) and of course while platelets low 9. Probably not  discharge until platelets start coming back up  2. Hypertension:  blood pressures improved with resumption of antihypertensive therapy/dialysis.  3. Anemia: Likely anemia of chronic disease with recent hospitalizations/vasculitis. Low iron saturation/high ferritin (1100) but I think a limited Fe load reasonable. (5 doses ferrlecit) On Aranesp 150/week with hemodialysis. Transfused 3/20, 3/24.  4. Hyperphosphatemia: phos OK no binder-  PTH 109, continue to monitor- no vit D. 5. Upper respiratory symptoms- some sinus sx still present but improved. Will need f/u wioth pulm for lung nodules  Jamal Maes, MD [facility] [phone] Pager 08/04/2016, 12:39 PM

## 2016-08-04 NOTE — Progress Notes (Signed)
qPhysical Therapy Treatment Patient Details Name: [name] MRN: [id number] DOB: 10/15/1945 Today's Date: 08/04/2016    History of Present Illness [name] is a 71 y.o. female with medical history significant of COPD, esophageal stricture, GERD, hiatal hernia, hyperlipidemia, hypertension, IBS presenting with acute renal failure necessitating aggressive workup and treatment.    PT Comments    Pt progressing towards goals. Mobility continues to be limited by fatigue. Practiced gait training with RW and pt demonstrated increased steadiness. Will continue to progress mobility. Continue to recommend d/c recommendations below to increase safety and independence with mobility. Will continue to follow.    Follow Up Recommendations  Home health PT;Supervision - Intermittent     Equipment Recommendations  Rolling walker with 5" wheels;3in1 (PT)    Recommendations for Other Services       Precautions / Restrictions Precautions Precautions: None Restrictions Weight Bearing Restrictions: No    Mobility  Bed Mobility Overal bed mobility: Needs Assistance Bed Mobility: Supine to Sit;Sit to Supine     Supine to sit: Supervision Sit to supine: Supervision   General bed mobility comments: Extra time required. Supervision for safety.   Transfers Overall transfer level: Needs assistance Equipment used: Rolling walker (2 wheeled) Transfers: Sit to/from Stand Sit to Stand: Supervision         General transfer comment: Supervision for safety. Good hand placement prior to transfer with RW.   Ambulation/Gait Ambulation/Gait assistance: Min guard;Supervision Ambulation Distance (Feet): 125 Feet Assistive device: Rolling walker (2 wheeled) Gait Pattern/deviations: Step-through pattern;Drifts right/left;Narrow base of support Gait velocity: Decreased Gait velocity interpretation: Below normal speed for age/gender General Gait Details: Verbal cues with demonstration for  appropriate use of RW during transfer. Ambulation distance limited secondary to fatigue. Verbal cues for sequencing with use of RW. Demonstrated increased steadiness with use of RW, and educated about use at home. Pt agreeable to using RW at home.     Stairs            Wheelchair Mobility    Modified Rankin (Stroke Patients Only)       Balance Overall balance assessment: Needs assistance Sitting-balance support: No upper extremity supported Sitting balance-Leahy Scale: Good     Standing balance support: No upper extremity supported Standing balance-Leahy Scale: Fair Standing balance comment: Maintained balance at sink without use of UE, however, leaned on sink for support.                             Cognition Arousal/Alertness: Awake/alert Behavior During Therapy: WFL for tasks assessed/performed Overall Cognitive Status: Within Functional Limits for tasks assessed                                        Exercises      General Comments General comments (skin integrity, edema, etc.): Pt agreeable to walking this session, however, further mobility limited secondary to fatigue. Discussed equipment recommendations with pt and answered all questions she had. Discussed HHPT recommendations and addressed questions. Pt requiring supervision for standing at the sink this session to brush teeth.       Pertinent Vitals/Pain Pain Assessment: Faces Faces Pain Scale: No hurt    Home Living                      Prior Function  PT Goals (current goals can now be found in the care plan section) Acute Rehab PT Goals Patient Stated Goal: Get back home PT Goal Formulation: With patient Time For Goal Achievement: 08/10/16 Potential to Achieve Goals: Good Progress towards PT goals: Progressing toward goals    Frequency    Min 3X/week      PT Plan Current plan remains appropriate    Co-evaluation             End of  Session Equipment Utilized During Treatment: Gait belt Activity Tolerance: Patient limited by fatigue Patient left: in bed;with call bell/phone within reach Nurse Communication: Mobility status PT Visit Diagnosis: Unsteadiness on feet (R26.81);Muscle weakness (generalized) (M62.81)     Time: 4037-0964 PT Time Calculation (min) (ACUTE ONLY): 11 min  Charges:  $Gait Training: 8-22 mins                    G Codes:       Nicky Pugh, PT, DPT  Acute Rehabilitation Services  Pager: [phone]  Army Melia 08/04/2016, 1:29 PM

## 2016-08-04 NOTE — Progress Notes (Signed)
Nutrition Follow-up  DOCUMENTATION CODES:   Not applicable  INTERVENTION:  Continue Boost Breeze po BID, each supplement provides 250 kcal and 9 grams of protein  Encourage adequate PO intake.   NUTRITION DIAGNOSIS:   Increased nutrient needs related to chronic illness as evidenced by estimated needs; ongoing  GOAL:   Patient will meet greater than or equal to 90% of their needs; progressing  MONITOR:   PO intake, Supplement acceptance, Labs, Weight trends, Skin, I & O's  REASON FOR ASSESSMENT:   Malnutrition Screening Tool    ASSESSMENT:   71 y.o. female with medical history significant for COPD, esophageal stricture, GERD, hiatal hernia, hyperlipidemia, hypertension, IBS, cervical disc disease and history of lung nodules with extensive workup including lung biopsy ruling out Sjogren's disease. She was hospitalized 2 months ago for fever/weight loss and relative hypotension with anorexia and night sweats and was treated for community-acquired pneumonia. She has continued to have sinus drainage with some epistaxis and bloody secretions and also had nausea with dysgeusia for the past few weeks or so. 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 Pt also on plasmapheresis on alternate days for 2 weeks (last TPE 3/26).  Meal completion has been varied from 25-100% with 70-100% po most recently. Pt reports appetite has continued to improved however complains that food at meals have been bland and need more seasoning and flavor. Pt dislikes Mrs. Dash. Encouraged pt to eat at meals for adequate nutrition. Pt expressed understanding. Pt currently has Boost Breeze ordered. RD to continue with current orders to aid in caloric and protein needs if po poor.  Labs and medications reviewed.   Diet Order:  Diet renal with fluid restriction  Fluid restriction: 1200 mL Fluid; Room service appropriate? Yes; Fluid consistency: Thin  Skin:  Reviewed, no issues  Last BM:  3/27  Height:   Ht Readings from Last 1 Encounters:  07/22/16 5\' 3"  (1.6 m)    Weight:   Wt Readings from Last 1 Encounters:  08/04/16 121 lb 7.6 oz (55.1 kg)    Ideal Body Weight:  52.27 kg  BMI:  Body mass index is 21.52 kg/m.  Estimated Nutritional Needs:   Kcal:  1700-1900  Protein:  75-85 grams  Fluid:  1.2 L/day  EDUCATION NEEDS:   Education needs addressed  Corrin Parker, MS, RD, LDN Pager # [phone] After hours/ weekend pager # [phone]

## 2016-08-04 NOTE — Progress Notes (Signed)
PROGRESS NOTE                                                                                                                                                                                                             Patient Demographics:    [name], is a 71 y.o. female, DOB - June 14, 1945, ZOX:[id number]  Admit date - 07/20/2016   Admitting Physician Waldemar Dickens, MD  Outpatient Primary MD for the patient is [facility], [place]  LOS - 15  Outpatient Specialists: NONE  No chief complaint on file.      Brief Narrative   71 y.o.femalewith medical history significant for COPD, esophageal stricture, GERD, hiatal hernia, hyperlipidemia, hypertension, IBS, cervical disc disease and history of lung nodules with extensive workup including lung biopsy ruling out Sjogren's disease. Pt has had recurrent history of sinusitis since last fall, treated with several rounds of antibiotics and corticosteroids which have not resulted in significant clinical improvement. She was hospitalized 2 months ago for fever/weight loss and relative hypotension with anorexia and night sweats and was treated for community-acquired pneumonia. She has continued to have sinus drainage with some epistaxis and bloody secretions and also had nausea with dysgeusia for the past few weeks or so. Pt also reported severe muscle pain and leg cramps over the same time period. Of note, pt is followed by Dr. Elsworth Soho for abnormal CT imaging &pulmonary nodules. Work up for this has included an open lung biopsy in 11/2008 in the setting of fever/night sweats and CT scan with pulmonary nodules which showed possible inflammatory pseudotumor vs reaction to old bronchopneumonia associated with some carcinoid tumorlets. Salivary  gland biospy 03/2009 was negative for Sjogren's disease and negative for autoimmune work up. Serial CT's were planned but she was lost to follow  up. She had a repeat CT of the chest in 06/2016 which showed volume loss, post surgical changes of the left hemithorax and RLL pulmonary nodules.   In regards to pt's creatinine, in January of 2018 was 1.7 and in February 2018 it ranged between 1.6 and 2.1 with a peak of 2.8 that prompted referral to nephrology. After evaluation 07/20/16, she was found to have an elevated creatinine level of 12.4 and given the high risk of suspicion for vasculitis pt was directly admitted to hospital for evaluation.  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 She was on solumedrol and then switched to PO prednisone. She is required to have plasmapheresis on alternate days for 2 weeks (started 07/22/2016).   Subjective:   Pt has no new complaints. No acute issues overnight reported.   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 Renal started on Cyclophosphamide 100 mg PO QD.  - She has outpt HD established ,  TTS. ( has spot at [facility]) - Nephrology managing.  - repeat anti GBM of 34.  -has outpatient hemodialysis established. renal want to wait a little before perm access .( If no return to renal function in next 4 weeks).  Essential hypertension - Continue Norvasc  -Improved.  Anemia of chronic renal disease -Aranesp when necessary with dialysis. Significantly  improved hemoglobin  today.  Hyperphosphatemia - Low phosphorus diet, monitor with hemodialysis - PTH level 109  Thrombocytopenia Mild. Monitor  Leukocytosis Possibly due to steroid. No signs of infection.  Dyslipidemia - Continue Lipitor   RLL pulmonary nodules - Pulm recommended prophylactic bactrim while on immunosuppresion - Repeat CT imaging done this admission shows unchanged pulmonary nodule. If comparative CT in 2 months unchanged and no evidence of renal recover, doubt that there is benefit of continued therapy. - Monitor for hemoptysis - Follow up with Dr. Elsworth Soho at discharge >had prior PET arranged for May 2018 to review pulmonary nodules .   Constipation Not relieved with  Dulcolax suppository and MiraLAX. Ordered tap water enema.    Code Status : Full code  Family Communication  : None at bedside  Disposition Plan  : Home  once no further plasma exchange needed   Barriers For Discharge : Active symptoms, once cleared by Nephrology for d/c  Consults  :   Nephrology Pulmonary IR    Procedures    RIJ cathter for HD placed 07/20/2016  Renal biopsy 07/21/2016  Plasmapheresis 07/22/16  HD TTS  Cytoxan - started 07/27/16  DVT Prophylaxis  :  SCDs ( no heparin for 1 month post renal bx)  Lab Results  Component Value Date   PLT 62 (L) 08/04/2016    Antibiotics  :    Anti-infectives    Start     Dose/Rate Route Frequency Ordered Stop   07/27/16 0900  sulfamethoxazole-trimethoprim (BACTRIM DS,SEPTRA DS) 800-160 MG per tablet 1 tablet     1 tablet Oral Once per day on Mon Wed Fri 07/26/16 1639     07/20/16 1430  ceFAZolin (ANCEF) IVPB 2g/100 mL premix     2 g 200 mL/hr over 30 Minutes Intravenous To Radiology 07/20/16 1424 07/20/16 1641        Objective:   Vitals:   08/04/16 1030 08/04/16 1039 08/04/16 1120 08/04/16 1632  BP: (!) 173/73 (!) 173/81 (!) 150/65 130/66  Pulse: 63 60 71 67  Resp:  '18 16 16  ' Temp:  98 F (36.7 C) 98.7 F (37.1 C) 98.3 F (  36.8  C)  TempSrc:  Oral Oral Oral  SpO2:  100% 100% 100%  Weight:      Height:        Wt Readings from Last 3 Encounters:  08/04/16 55.1 kg (121 lb 7.6 oz)  06/29/16 65.8 kg (145 lb)  06/19/16 64 kg (141 lb)     Intake/Output Summary (Last 24 hours) at 08/04/16 1753 Last data filed at 08/04/16 1400  Gross per 24 hour  Intake              840 ml  Output              100 ml  Net              740 ml     Physical Exam  Gen: alert and awake, in nad. HEENT: moist mucosa,  rt temp HD catheter, atraumatic, normocephalic Chest: clear b/l, no wheezes, equal chest rise. CVS: N S1&S2, no murmurs,  GI: soft, NT, ND, Musculoskeletal: warm, no edema     Data Review:    CBC  Recent Labs Lab 07/30/16 0604 07/31/16 0538 08/01/16 1016 08/02/16 0737 08/03/16 0702 08/04/16 0731  WBC 12.0* 16.0*  --  12.8* 9.2 13.9*  HGB 7.5* 10.4* 10.5* 9.5* 9.9* 9.3*  HCT 22.1* 30.8* 31.0* 27.5* 28.6* 26.9*  PLT 150 146*  --  72* 54* 62*  MCV 83.1 85.3  --  87.6 87.7 86.5  MCH 28.2 28.8  --  30.3 30.4 29.9  MCHC 33.9 33.8  --  34.5 34.6 34.6  RDW 16.3* 15.8*  --  17.4* 17.6* 18.0*    Chemistries   Recent Labs Lab 07/31/16 0538 08/01/16 0630 08/01/16 1016 08/02/16 0602 08/03/16 0702 08/04/16 0539  NA 137 137 136 137 134* 134*  K 3.5 4.1 3.8 4.1 4.1 3.9  CL 98* 100* 96* 103 99* 100*  CO2 28 25  --  19* 24 25  GLUCOSE 86 99 93 91 110* 132*  BUN 12 29* 31* 42* 23* 40*  CREATININE 2.59* 4.27* 4.50* 5.35* 3.63* 5.15*  CALCIUM 8.5* 8.4*  --  8.5* 8.3* 8.4*   ------------------------------------------------------------------------------------------------------------------ No results for input(s): CHOL, HDL, LDLCALC, TRIG, CHOLHDL, LDLDIRECT in the last 72 hours.  Lab Results  Component Value Date   HGBA1C 5.8 02/02/2010   ------------------------------------------------------------------------------------------------------------------ No results for input(s): TSH, T4TOTAL, T3FREE,  THYROIDAB in the last 72 hours.  Invalid input(s): FREET3 ------------------------------------------------------------------------------------------------------------------ No results for input(s): VITAMINB12, FOLATE, FERRITIN, TIBC, IRON, RETICCTPCT in the last 72 hours.  Coagulation profile No results for input(s): INR, PROTIME in the last 168 hours.  No results for input(s): DDIMER in the last 72 hours.  Cardiac Enzymes No results for input(s): CKMB, TROPONINI, MYOGLOBIN in the last 168 hours.  Invalid input(s): CK ------------------------------------------------------------------------------------------------------------------ No results found for: BNP  Inpatient Medications  Scheduled Meds: . sodium chloride   Intravenous Once  . sodium chloride   Intravenous Once  . amLODipine  5 mg Oral Daily  . aspirin EC  81 mg Oral Daily  . atorvastatin  10 mg Oral q1800  . bisacodyl  10 mg Rectal Daily  . darbepoetin (ARANESP) injection - DIALYSIS  150 mcg Intravenous Q Sat-HD  . feeding supplement  1 Container Oral BID BM  . [START ON 08/06/2016] ferric gluconate (FERRLECIT/NULECIT) IV  125 mg Intravenous Q T,Th,Sa-HD  . fluticasone  2 spray Each Nare QHS  . loratadine  10 mg Oral Daily  . pantoprazole  40 mg Oral Daily  .  polyethylene glycol  17 g Oral Daily  . predniSONE  60 mg Oral Q breakfast  . sodium chloride  2 spray Each Nare TID  . sodium chloride flush  3 mL Intravenous Q12H  . sulfamethoxazole-trimethoprim  1 tablet Oral Once per day on Mon Wed Fri   Continuous Infusions:  PRN Meds:.sodium chloride, calcium carbonate, methocarbamol (ROBAXIN)  IV, ondansetron **OR** ondansetron (ZOFRAN) IV, senna, sodium chloride flush  Micro Results No results found for this or any previous visit (from the past 240 hour(s)).  Radiology Reports Ct Chest Wo Contrast  Result Date: 07/29/2016 CLINICAL DATA:  71 year old female inpatient admitted for acute kidney injury suspected to  be due to ANCA associated vasculitis. Longstanding history of indeterminate pulmonary nodules, described as inflammatory pseudotumors on surgical lung biopsy of the left lung on 11/06/2008. EXAM: CT CHEST WITHOUT CONTRAST TECHNIQUE: Multidetector CT imaging of the chest was performed following the standard protocol without IV contrast. COMPARISON:  06/20/2016 chest CT.  07/26/2016 chest radiograph. FINDINGS: Cardiovascular: Top-normal heart size . No significant pericardial fluid/thickening. Right internal jugular dialysis catheter terminates at the cavoatrial junction. Atherosclerotic nonaneurysmal thoracic aorta. Stable dilated pulmonary arteries (main pulmonary artery diameter 3.4 cm). Mediastinum/Nodes: Stable heterogeneous thyroid gland with possible confluent small hypodense thyroid nodules. 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 No pathologically enlarged axillary, mediastinal or gross hilar lymph nodes, noting limited sensitivity for the detection of hilar adenopathy on this noncontrast study. Lungs/Pleura: No pneumothorax. New trace dependent bilateral pleural effusions. Stable postsurgical changes from wedge resection in the left upper and left lower lobes. Mild centrilobular and paraseptal emphysema with mild diffuse bronchial wall thickening. No acute consolidative airspace disease or new significant pulmonary nodules. Stable parenchymal band in the posterior right upper lobe with associated distortion, compatible with postinfectious/postinflammatory scarring. Stable chronic thickening of the peribronchovascular interstitium throughout both lungs with associated chronic mild distortion. Scattered irregular pulmonary nodules in the right lung are unchanged in the interval and not convincingly changed back to 12/04/2009 chest CT, compatible with benign inflammatory nodules. For example a 1.5 x 1.0 cm  peripheral right upper lobe nodule (series 4/ image 41), measuring 1.5 x 1.0 cm on 06/20/2016 and 1.5 x 0.9 cm on 12/04/2009. Partially cystic irregular right lower lobe 2.2 x 1.7 cm nodule, measuring 2.3 x 1.8 cm on 06/20/2016 and 2.3 x 1.9 cm on 12/04/2009. Upper abdomen: Unremarkable. Musculoskeletal: No aggressive appearing focal osseous lesions. Mild thoracic spondylosis. New mild anasarca. IMPRESSION: 1. New mild anasarca and trace dependent bilateral pleural effusions. Top-normal heart size. 2. Scattered irregular pulmonary nodules in the right lung are stable in the interval and not convincingly changed back to 2011, compatible with benign inflammatory nodules given the results of the 2010 surgical left lung biopsy. 3. Stable postsurgical and postinflammatory scarring in both lungs. No acute pulmonary disease. 4. Mild emphysema with mild diffuse bronchial wall thickening, suggesting COPD. 5. Aortic atherosclerosis. 6. Stable dilated main pulmonary artery, suggesting pulmonary arterial hypertension. Electronically Signed   By: Ilona Sorrel M.D.   On: 07/29/2016 15:48   US Biopsy  Result Date: 07/21/2016 INDICATION: Acute renal failure EXAM: ULTRASOUND-GUIDED BIOPSY OF THE RENAL CORTEX.  RANDOM CORE. MEDICATIONS: None. ANESTHESIA/SEDATION: Fentanyl 50 mcg IV; Versed 1 mg IV Moderate Sedation Time:  10 The patient was continuously monitored during the procedure by the interventional radiology nurse under my direct supervision. FLUOROSCOPY TIME:  None. COMPLICATIONS: None immediate. PROCEDURE: Informed  written consent was obtained from the patient after a thorough discussion of the procedural risks, benefits and alternatives. All questions were addressed. Maximal Sterile Barrier Technique was utilized including caps, mask, sterile gowns, sterile gloves, sterile drape, hand hygiene and skin antiseptic. A timeout was performed prior to the initiation of the procedure. The back was prepped with ChloraPrep in a  sterile fashion, and a sterile drape was applied covering the operative field. A sterile gown and sterile gloves were used for the procedure. Under sonographic guidance, 2 16 gauge core biopsies of the cortex of the lower pole of the left kidney were obtained. Final imaging was performed. Patient tolerated the procedure well without complication. Vital sign monitoring by nursing staff during the procedure will continue as patient is in the special procedures unit for post procedure observation. FINDINGS: The images document guide needle placement within the left lower pole renal cortex. Post biopsy images demonstrate no evidence of hemorrhage. IMPRESSION: Successful ultrasound-guided random renal cortex core biopsy. Electronically Signed   By: Marybelle Killings M.D.   On: 07/21/2016 14:36   Ir Fluoro Guide Cv Line Right  Result Date: 07/20/2016 INDICATION: End-stage renal disease. In need of durable intravenous access for the initiation of dialysis. EXAM: TUNNELED CENTRAL VENOUS HEMODIALYSIS CATHETER PLACEMENT WITH ULTRASOUND AND FLUOROSCOPIC GUIDANCE MEDICATIONS: Ancef 2 gm IV . The antibiotic was given in an appropriate time interval prior to skin puncture. ANESTHESIA/SEDATION: Versed 1 mg IV; Fentanyl 50 mcg IV; Moderate Sedation Time:  17 minutes The patient was continuously monitored during the procedure by the interventional radiology nurse under my direct supervision. FLUOROSCOPY TIME:  Fluoroscopy Time: 12 seconds (2 mGy). COMPLICATIONS: None immediate. PROCEDURE: Informed written consent was obtained from the patient after a discussion of the risks, benefits, and alternatives to treatment. Questions regarding the procedure were encouraged and answered. The right neck and chest were prepped with chlorhexidine in a sterile fashion, and a sterile drape was applied covering the operative field. Maximum barrier sterile technique with sterile gowns and gloves were used for the procedure. A timeout was performed  prior to the initiation of the procedure. After creating a small venotomy incision, a micropuncture kit was utilized to access the right internal jugular vein under direct, real-time ultrasound guidance after the overlying soft tissues were anesthetized with 1% lidocaine with epinephrine. Ultrasound image documentation was performed. The microwire was kinked to measure appropriate catheter length. A stiff Glidewire was advanced to the level of the IVC and the micropuncture sheath was exchanged for a peel-away sheath. A Palindrome tunneled hemodialysis catheter measuring 19 cm from tip to cuff was tunneled in a retrograde fashion from the anterior chest wall to the venotomy incision. The catheter was then placed through the peel-away sheath with tips ultimately positioned within the superior aspect of the right atrium. Final catheter positioning was confirmed and documented with a spot radiographic image. The catheter aspirates and flushes normally. The catheter was flushed with appropriate volume heparin dwells. The catheter exit site was secured with a 0-Prolene retention suture. The venotomy incision was closed with an interrupted 4-0 Vicryl, Dermabond and Steri-strips. Dressings were applied. The patient tolerated the procedure well without immediate post procedural complication. IMPRESSION: Successful placement of 19 cm tip to cuff tunneled hemodialysis catheter via the right internal jugular vein with tips terminating within the superior aspect of the right atrium. The catheter is ready for immediate use. Electronically Signed   By: [name] M.D.   On: 07/20/2016 17:12   Ir US  Guide Vasc Access Right  Result Date: 07/20/2016 INDICATION: End-stage renal disease. In need of durable intravenous access for the initiation of dialysis. EXAM: TUNNELED CENTRAL VENOUS HEMODIALYSIS CATHETER PLACEMENT WITH ULTRASOUND AND FLUOROSCOPIC GUIDANCE MEDICATIONS: Ancef 2 gm IV . The antibiotic was given in an appropriate  time interval prior to skin puncture. ANESTHESIA/SEDATION: Versed 1 mg IV; Fentanyl 50 mcg IV; Moderate Sedation Time:  17 minutes The patient was continuously monitored during the procedure by the interventional radiology nurse under my direct supervision. FLUOROSCOPY TIME:  Fluoroscopy Time: 12 seconds (2 mGy). COMPLICATIONS: None immediate. PROCEDURE: Informed written consent was obtained from the patient after a discussion of the risks, benefits, and alternatives to treatment. Questions regarding the procedure were encouraged and answered. The right neck and chest were prepped with chlorhexidine in a sterile fashion, and a sterile drape was applied covering the operative field. Maximum barrier sterile technique with sterile gowns and gloves were used for the procedure. A timeout was performed prior to the initiation of the procedure. After creating a small venotomy incision, a micropuncture kit was utilized to access the right internal jugular vein under direct, real-time ultrasound guidance after the overlying soft tissues were anesthetized with 1% lidocaine with epinephrine. Ultrasound image documentation was performed. The microwire was kinked to measure appropriate catheter length. A stiff Glidewire was advanced to the level of the IVC and the micropuncture sheath was exchanged for a peel-away sheath. A Palindrome tunneled hemodialysis catheter measuring 19 cm from tip to cuff was tunneled in a retrograde fashion from the anterior chest wall to the venotomy incision. The catheter was then placed through the peel-away sheath with tips ultimately positioned within the superior aspect of the right atrium. Final catheter positioning was confirmed and documented with a spot radiographic image. The catheter aspirates and flushes normally. The catheter was flushed with appropriate volume heparin dwells. The catheter exit site was secured with a 0-Prolene retention suture. The venotomy incision was closed with an  interrupted 4-0 Vicryl, Dermabond and Steri-strips. Dressings were applied. The patient tolerated the procedure well without immediate post procedural complication. IMPRESSION: Successful placement of 19 cm tip to cuff tunneled hemodialysis catheter via the right internal jugular vein with tips terminating within the superior aspect of the right atrium. The catheter is ready for immediate use. Electronically Signed   By: [name] M.D.   On: 07/20/2016 17:12   Dg Chest Port 1 View  Result Date: 07/26/2016 CLINICAL DATA:  Pulmonary nodules EXAM: PORTABLE CHEST 1 VIEW COMPARISON:  06/19/2016, chest CT 06/20/2016 FINDINGS: Right dialysis catheter is in place with the tip at the cavoatrial junction. Postoperative changes on the left. Areas of scarring and nodularity in the left lung are stable. Decreasing airspace opacities in the right lung with some residual areas of nodularity. Heart is borderline in size. No visible effusions. IMPRESSION: Stable scarring and nodularity in the left lung with postoperative changes. Improving areas of nodularity in the right lung. Electronically Signed   By: Rolm Baptise M.D.   On: 07/26/2016 16:30   Ct Maxillofacial Wo Contrast  Result Date: 07/20/2016 CLINICAL DATA:  71 y/o  F; recurrent sinusitis. EXAM: CT MAXILLOFACIAL WITHOUT CONTRAST TECHNIQUE: Multidetector CT imaging of the maxillofacial structures was performed. Multiplanar CT image reconstructions were also generated. A small metallic BB was placed on the right temple in order to reliably differentiate right from left. COMPARISON:  03/13/2016 MRI head.  12/02/2015 CT head. FINDINGS: Frontal sinus: Normally aerated. Patent frontal sinus drainage pathways. Ethmoid  sinus: Normally aerated. Maxillary sinuses:  Normally aerated. Sphenoid sinus: Normally aerated. Patent sphenoid ethmoidal recesses. Right ostiomeatal unit:  Patent. Left ostiomeatal unit:  Patent. Nasal passages: Patent. Intact nasal septum. Mild leftward  nasal septal deviation and small spur. Other: Orbits and intracranial compartment are unremarkable. Mastoid air cells are normally pneumatized. IMPRESSION: Normally aerated paranasal sinuses and patent sinus drainage pathways. Electronically Signed   By: Kristine Garbe M.D.   On: 07/20/2016 14:22    Time Spent in minutes  25   Velvet Bathe M.D on 08/04/2016 at 5:53 PM  Between 7am to 7pm - Pager - [phone]  After 7pm go to [email or web address] - password [facility]  [facility] -  Office  [phone]

## 2016-08-05 ENCOUNTER — Encounter (HOSPITAL_COMMUNITY): Payer: Self-pay

## 2016-08-05 LAB — RENAL FUNCTION PANEL
Albumin: 3.5 g/dL (ref 3.5–5.0)
Anion gap: 10 (ref 5–15)
BUN: 17 mg/dL (ref 6–20)
CALCIUM: 8.5 mg/dL — AB (ref 8.9–10.3)
CHLORIDE: 99 mmol/L — AB (ref 101–111)
CO2: 27 mmol/L (ref 22–32)
CREATININE: 3.53 mg/dL — AB (ref 0.44–1.00)
GFR calc non Af Amer: 12 mL/min — ABNORMAL LOW (ref >60)
GFR, EST AFRICAN AMERICAN: 14 mL/min — AB (ref >60)
GLUCOSE: 94 mg/dL (ref 65–99)
PHOSPHORUS: 2.8 mg/dL (ref 2.5–4.6)
Potassium: 4.1 mmol/L (ref 3.5–5.1)
Sodium: 136 mmol/L (ref 135–145)

## 2016-08-05 LAB — CBC
HEMATOCRIT: 28.5 % — AB (ref 36.0–46.0)
HEMOGLOBIN: 9.6 g/dL — AB (ref 12.0–15.0)
MCH: 29.6 pg (ref 26.0–34.0)
MCHC: 33.7 g/dL (ref 30.0–36.0)
MCV: 88 fL (ref 78.0–100.0)
Platelets: 42 10*3/uL — ABNORMAL LOW (ref 150–400)
RBC: 3.24 MIL/uL — ABNORMAL LOW (ref 3.87–5.11)
RDW: 18.2 % — AB (ref 11.5–15.5)
WBC: 10.9 10*3/uL — ABNORMAL HIGH (ref 4.0–10.5)

## 2016-08-05 MED ORDER — PREDNISONE 20 MG PO TABS
40.0000 mg | ORAL_TABLET | Freq: Every day | ORAL | Status: DC
  Administered 2016-08-06 – 2016-08-07 (×2): 40 mg via ORAL
  Filled 2016-08-05 (×2): qty 2

## 2016-08-05 NOTE — Progress Notes (Addendum)
Patient's BP 167/65. On call MD, Hamad, notified. RN awaiting orders.  Ermalinda Memos, RN

## 2016-08-05 NOTE — Progress Notes (Signed)
Patient ID: [name], female   DOB: October 29, 1945, 71 y.o.   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 (from a high of 123 pre TPE) On TTS HD Schedule Has become thrombocytopenic and we have stopped her cytoxan as of 3/28 No bleeding noted Makes minimal urine   Objective:   BP (!) 152/63 (BP Location: Right Arm)   Pulse 72   Temp 98.6 F (37 C) (Oral)   Resp 16   Ht 5\' 3"  (1.6 m)   Wt 54.7 kg (120 lb 9.6 oz)   SpO2 100%   BMI 21.36 kg/m   Intake/Output Summary (Last 24 hours) at 08/05/16 1659 Last data filed at 08/05/16 1300  Gross per 24 hour  Intake              460 ml  Output                0 ml  Net              460 ml   Weight change: -0.544 kg (-1 lb 3.2 oz)  Physical Exam: Comfortably resting in bed  VS as noted Right IJ PC  Regular rhythm, normal S1 and S2 Clear to auscultation, no rales/rhonchi Abd Soft, flat, nontender No lower extremity edema  Recent Labs Lab 07/30/16 0604 07/31/16 0538 08/01/16 0630 08/01/16 1016 08/02/16 0602 08/03/16 0702 08/04/16 0539 08/05/16 0551  NA 137 137 137 136 137 134* 134* 136  K 3.5 3.5 4.1 3.8 4.1 4.1 3.9 4.1  CL 104 98* 100* 96* 103 99* 100* 99*  CO2 22 28 25   --  19* 24 25 27   GLUCOSE 120* 86 99 93 91 110* 132* 94  BUN 30* 12 29* 31* 42* 23* 40* 17  CREATININE 4.42* 2.59* 4.27* 4.50* 5.35* 3.63* 5.15* 3.53*  CALCIUM 8.1* 8.5* 8.4*  --  8.5* 8.3* 8.4* 8.5*  PHOS 3.6 2.1* 4.2  --  4.9* 3.8 2.8 2.8     Recent Labs Lab 08/02/16 0737 08/03/16 0702 08/04/16 0731 08/05/16 1222  WBC 12.8* 9.2 13.9* 10.9*  HGB 9.5* 9.9* 9.3* 9.6*  HCT 27.5* 28.6* 26.9* 28.5*  MCV 87.6 87.7 86.5 88.0  PLT 72* 54* 62* 42*   Medications:    . sodium chloride   Intravenous Once  . sodium chloride   Intravenous Once  . amLODipine  5 mg Oral Daily  . aspirin EC  81 mg Oral Daily  . atorvastatin  10 mg Oral q1800  .  bisacodyl  10 mg Rectal Daily  . darbepoetin (ARANESP) injection - DIALYSIS  150 mcg Intravenous Q Sat-HD  . feeding supplement  1 Container Oral BID BM  . [START ON 08/06/2016] ferric gluconate (FERRLECIT/NULECIT) IV  125 mg Intravenous Q T,Th,Sa-HD  . fluticasone  2 spray Each Nare QHS  . loratadine  10 mg Oral Daily  . pantoprazole  40 mg Oral Daily  . polyethylene glycol  17 g Oral Daily  . predniSONE  60 mg Oral Q breakfast  . sodium chloride  2 spray Each Nare TID  . sodium chloride flush  3 mL Intravenous Q12H  . sulfamethoxazole-trimethoprim  1 tablet Oral Once per day on Mon Wed Fri     Assessment/ Plan:    1. 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  Had TPE X 8 tmts starting 3/16. Reduced GBM AB 125->33. Thrombocytopenia necessitated stopping cytoxan. Still on steroids. No sign of renal recovery. In looking at the history, process probably brewing since late January, creatinine was 12.5 at time of dx. VERY poor renal recovery prognosis. I think now ESRD. Should probably plan not to restart cytoxan unless pulm thinks needs (can't now anyway - plts low). When platelets recover, get permanent access in. Continue dialysis on a TTS schedule--- has spot TTS at [facility] ([facility]  No heparin with HD for at least a month post renal biopsy date (which was 07/21/16) and of course while platelets low  2. Hypertension:  Blood pressures improved with resumption of antihypertensive therapy/dialysis.  3. Anemia: Likely anemia of chronic disease with recent hospitalizations/vasculitis. Low iron saturation/high ferritin (1100) but I think a limited Fe load reasonable. (5 doses ferrlecit) On Aranesp 150/week with hemodialysis. Transfused 3/20, 3/24.  4. Hyperphosphatemia: phos OK no binder-  PTH 109, continue to monitor- no vit  D. 5. Upper respiratory symptoms- some sinus sx still present but improved. Will need f/u wioth pulm for lung nodules  Jamal Maes, MD [facility] [phone] Pager 08/05/2016, 4:59 PM

## 2016-08-05 NOTE — Progress Notes (Signed)
PROGRESS NOTE                                                                                                                                                                                                             Patient Demographics:    [name], is a 71 y.o. female, DOB - 1945/12/25, AXE:[id number]  Admit date - 07/20/2016   Admitting Physician Waldemar Dickens, MD  Outpatient Primary MD for the patient is [facility], MD  LOS - 16  Outpatient Specialists: NONE  No chief complaint on file.      Brief Narrative   71 y.o.femalewith medical history significant for COPD, esophageal stricture, GERD, hiatal hernia, hyperlipidemia, hypertension, IBS, cervical disc disease and history of lung nodules with extensive workup including lung biopsy ruling out Sjogren's disease. Pt has had recurrent history of sinusitis since last fall, treated with several rounds of antibiotics and corticosteroids which have not resulted in significant clinical improvement. She was hospitalized 2 months ago for fever/weight loss and relative hypotension with anorexia and night sweats and was treated for community-acquired pneumonia. She has continued to have sinus drainage with some epistaxis and bloody secretions and also had nausea with dysgeusia for the past few weeks or so. Pt also reported severe muscle pain and leg cramps over the same time period. Of note, pt is followed by Dr. Elsworth Soho for abnormal CT imaging &pulmonary nodules. Work up for this has included an open lung biopsy in 11/2008 in the setting of fever/night sweats and CT scan with pulmonary nodules which showed possible inflammatory pseudotumor vs reaction to old bronchopneumonia associated with some carcinoid tumorlets. Salivary  gland biospy 03/2009 was negative for Sjogren's disease and negative for autoimmune work up. Serial CT's were planned but she was lost to follow  up. She had a repeat CT of the chest in 06/2016 which showed volume loss, post surgical changes of the left hemithorax and RLL pulmonary nodules.   In regards to pt's creatinine, in January of 2018 was 1.7 and in February 2018 it ranged between 1.6 and 2.1 with a peak of 2.8 that prompted referral to nephrology. After evaluation 07/20/16, she was found to have an elevated creatinine level of 12.4 and given the high risk of suspicion for vasculitis pt was directly admitted to hospital for evaluation.  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 She was on solumedrol and then switched to PO prednisone. She is required to have plasmapheresis on alternate days for 2 weeks (started 07/22/2016).   Subjective:   Pt denies any new problems.   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 Renal started on Cyclophosphamide 100 mg PO QD.  - She has outpt HD established ,  TTS. ( has spot at [facility]) - Nephrology managing.  - repeat anti GBM of 34.  -has outpatient hemodialysis established. renal want to wait a little before perm access .( If no return to renal function in next 4 weeks).  Essential hypertension - Continue Norvasc  -Improved.  Anemia of chronic renal disease -Aranesp when necessary with dialysis. Significantly  improved hemoglobin today.  Hyperphosphatemia - Low phosphorus diet,  monitor with hemodialysis - PTH level 109  Thrombocytopenia Mild. Monitor  Leukocytosis Possibly due to steroid. No signs of infection.  Dyslipidemia - Continue Lipitor   RLL pulmonary nodules - Pulm recommended prophylactic bactrim while on immunosuppresion - Repeat CT imaging done this admission shows unchanged pulmonary nodule. If comparative CT in 2 months unchanged and no evidence of renal recover, doubt that there is benefit of continued therapy. - Monitor for hemoptysis - Follow up with Dr. Elsworth Soho at discharge >had prior PET arranged for May 2018 to review pulmonary nodules .   Constipation Not relieved with  Dulcolax suppository and MiraLAX. Ordered tap water enema.    Code Status : Full code  Family Communication  : None at bedside  Disposition Plan  : Home  once no further plasma exchange needed   Barriers For Discharge : Active symptoms, once cleared by Nephrology for d/c  Consults  :   Nephrology Pulmonary IR    Procedures    RIJ cathter for HD placed 07/20/2016  Renal biopsy 07/21/2016  Plasmapheresis 07/22/16  HD TTS  Cytoxan - started 07/27/16  DVT Prophylaxis  :  SCDs ( no heparin for 1 month post renal bx)  Lab Results  Component Value Date   PLT 42 (L) 08/05/2016    Antibiotics  :    Anti-infectives    Start     Dose/Rate Route Frequency Ordered Stop   07/27/16 0900  sulfamethoxazole-trimethoprim (BACTRIM DS,SEPTRA DS) 800-160 MG per tablet 1 tablet     1 tablet Oral Once per day on Mon Wed Fri 07/26/16 1639     07/20/16 1430  ceFAZolin (ANCEF) IVPB 2g/100 mL premix     2 g 200 mL/hr over 30 Minutes Intravenous To Radiology 07/20/16 1424 07/20/16 1641        Objective:   Vitals:   08/04/16 1632 08/04/16 2202 08/05/16 0421 08/05/16 1001  BP: 130/66 (!) 145/77 (!) 167/65 (!) 152/63  Pulse: 67 68 66 72  Resp: _0 Temp: 98.3 F (36.8 C) 99.1 F (37.3 C) 99.1 F (37.3 C) 98.6 F (37 C)  TempSrc: Oral Oral Oral  Oral  SpO2: 100% 100% 100% 100%  Weight:  54.7 kg (120 lb 9.6 oz)    Height:        Wt Readings from Last 3 Encounters:  08/04/16 54.7 kg (120 lb 9.6 oz)  06/29/16 65.8 kg (145 lb)  06/19/16 64 kg (141 lb)     Intake/Output Summary (Last 24 hours) at 08/05/16 1453 Last data filed at 08/05/16 0900  Gross per 24 hour  Intake              340 ml  Output                0 ml  Net              340 ml     Physical Exam  Gen: alert and awake, in nad. HEENT: moist mucosa,  rt temp HD catheter, atraumatic, normocephalic Chest: clear b/l, no wheezes, equal chest rise. CVS: N S1&S2, no murmurs,  GI: soft, NT, ND, Musculoskeletal: warm, no edema     Data Review:    CBC  Recent Labs Lab 07/31/16 0538 08/01/16 1016 08/02/16 0737 08/03/16 0702 08/04/16 0731 08/05/16 1222  WBC 16.0*  --  12.8* 9.2 13.9* 10.9*  HGB 10.4* 10.5* 9.5* 9.9* 9.3* 9.6*  HCT 30.8* 31.0* 27.5* 28.6* 26.9* 28.5*  PLT 146*  --  72* 54* 62* 42*  MCV 85.3  --  87.6 87.7 86.5 88.0  MCH 28.8  --  30.3 30.4 29.9 29.6  MCHC 33.8  --  34.5 34.6 34.6 33.7  RDW 15.8*  --  17.4* 17.6* 18.0* 18.2*    Chemistries   Recent Labs Lab 08/01/16 0630 08/01/16 1016 08/02/16 0602 08/03/16 0702 08/04/16 0539 08/05/16 0551  NA 137 136 137 134* 134* 136  K 4.1 3.8 4.1 4.1 3.9 4.1  CL 100* 96* 103 99* 100* 99*  CO2 25  --  19* _0 GLUCOSE 99 93 91 110* 132* 94  BUN 29* 31* 42* 23* 40* 17  CREATININE 4.27* 4.50* 5.35* 3.63* 5.15* 3.53*  CALCIUM 8.4*  --  8.5* 8.3* 8.4* 8.5*   ------------------------------------------------------------------------------------------------------------------ No results for input(s): CHOL, HDL, LDLCALC, TRIG, CHOLHDL, LDLDIRECT in the last 72 hours.  Lab Results  Component Value Date   HGBA1C 5.8 02/02/2010   ------------------------------------------------------------------------------------------------------------------ No results for input(s): TSH, T4TOTAL, T3FREE,  THYROIDAB in the last 72 hours.  Invalid input(s): FREET3 ------------------------------------------------------------------------------------------------------------------ No results for input(s): VITAMINB12, FOLATE, FERRITIN, TIBC, IRON, RETICCTPCT in the last 72 hours.  Coagulation profile No results for input(s): INR, PROTIME in the last 168 hours.  No results for input(s): DDIMER in the last 72 hours.  Cardiac Enzymes No results for input(s): CKMB, TROPONINI, MYOGLOBIN in the last 168 hours.  Invalid input(s): CK ------------------------------------------------------------------------------------------------------------------ No results found for: BNP  Inpatient Medications  Scheduled Meds: . sodium chloride   Intravenous Once  . sodium chloride   Intravenous Once  . amLODipine  5 mg Oral Daily  . aspirin EC  81 mg Oral Daily  . atorvastatin  10 mg Oral q1800  . bisacodyl  10 mg Rectal Daily  . darbepoetin (ARANESP) injection - DIALYSIS  150 mcg Intravenous Q Sat-HD  . feeding supplement  1 Container Oral BID BM  . [START ON 08/06/2016] ferric gluconate (FERRLECIT/NULECIT) IV  125 mg Intravenous Q T,Th,Sa-HD  . fluticasone  2 spray Each Nare QHS  . loratadine  10 mg Oral Daily  . pantoprazole  40 mg Oral Daily  . polyethylene glycol  17 g Oral Daily  . predniSONE  60 mg Oral Q breakfast  . sodium chloride  2 spray Each Nare TID  . sodium chloride flush  3 mL Intravenous Q12H  . sulfamethoxazole-trimethoprim  1 tablet Oral Once per day on Mon Wed Fri   Continuous Infusions:  PRN Meds:.sodium chloride, calcium carbonate, methocarbamol (ROBAXIN)  IV, ondansetron **OR** ondansetron (ZOFRAN) IV, senna, sodium chloride flush  Micro Results No results found for this or any previous visit (from the past 240 hour(s)).  Radiology Reports Ct Chest Wo Contrast  Result Date: 07/29/2016 CLINICAL DATA:  71 year old female inpatient admitted for acute kidney injury suspected to  be due to ANCA associated vasculitis. Longstanding history of indeterminate pulmonary nodules, described as inflammatory pseudotumors on surgical lung biopsy of the left lung on 11/06/2008. EXAM: CT CHEST WITHOUT CONTRAST TECHNIQUE: Multidetector CT imaging of the chest was performed following the standard protocol without IV contrast. COMPARISON:  06/20/2016 chest CT.  07/26/2016 chest radiograph. FINDINGS: Cardiovascular: Top-normal heart size . No significant pericardial fluid/thickening. Right internal jugular dialysis catheter terminates at the cavoatrial junction. Atherosclerotic nonaneurysmal thoracic aorta. Stable dilated pulmonary arteries (main pulmonary artery diameter 3.4 cm). Mediastinum/Nodes: Stable heterogeneous thyroid gland with possible confluent small hypodense thyroid nodules. 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 No pathologically enlarged axillary, mediastinal or gross hilar lymph nodes, noting limited sensitivity for the detection of hilar adenopathy on this noncontrast study. Lungs/Pleura: No pneumothorax. New trace dependent bilateral pleural effusions. Stable postsurgical changes from wedge resection in the left upper and left lower lobes. Mild centrilobular and paraseptal emphysema with mild diffuse bronchial wall thickening. No acute consolidative airspace disease or new significant pulmonary nodules. Stable parenchymal band in the posterior right upper lobe with associated distortion, compatible with postinfectious/postinflammatory scarring. Stable chronic thickening of the peribronchovascular interstitium throughout both lungs with associated chronic mild distortion. Scattered irregular pulmonary nodules in the right lung are unchanged in the interval and not convincingly changed back to 12/04/2009 chest CT, compatible with benign inflammatory nodules. For example a 1.5 x 1.0 cm  peripheral right upper lobe nodule (series 4/ image 41), measuring 1.5 x 1.0 cm on 06/20/2016 and 1.5 x 0.9 cm on 12/04/2009. Partially cystic irregular right lower lobe 2.2 x 1.7 cm nodule, measuring 2.3 x 1.8 cm on 06/20/2016 and 2.3 x 1.9 cm on 12/04/2009. Upper abdomen: Unremarkable. Musculoskeletal: No aggressive appearing focal osseous lesions. Mild thoracic spondylosis. New mild anasarca. IMPRESSION: 1. New mild anasarca and trace dependent bilateral pleural effusions. Top-normal heart size. 2. Scattered irregular pulmonary nodules in the right lung are stable in the interval and not convincingly changed back to 2011, compatible with benign inflammatory nodules given the results of the 2010 surgical left lung biopsy. 3. Stable postsurgical and postinflammatory scarring in both lungs. No acute pulmonary disease. 4. Mild emphysema with mild diffuse bronchial wall thickening, suggesting COPD. 5. Aortic atherosclerosis. 6. Stable dilated main pulmonary artery, suggesting pulmonary arterial hypertension. Electronically Signed   By: Ilona Sorrel M.D.   On: 07/29/2016 15:48   US Biopsy  Result Date: 07/21/2016 INDICATION: Acute renal failure EXAM: ULTRASOUND-GUIDED BIOPSY OF THE RENAL CORTEX.  RANDOM CORE. MEDICATIONS: None. ANESTHESIA/SEDATION: Fentanyl 50 mcg IV; Versed 1 mg IV Moderate Sedation Time:  10 The patient was continuously monitored during the procedure by the interventional radiology nurse under my direct supervision. FLUOROSCOPY TIME:  None. COMPLICATIONS: None immediate. PROCEDURE: Informed written consent was obtained from the patient after a thorough discussion of the procedural risks, benefits and alternatives. All questions were addressed. Maximal Sterile Barrier Technique was utilized including caps, mask, sterile gowns, sterile gloves, sterile drape, hand hygiene and skin antiseptic. A timeout was performed prior to the initiation of the procedure. The back was prepped with ChloraPrep in a  sterile fashion, and a sterile drape was applied covering the operative field. A sterile gown and sterile gloves were used for the procedure. Under sonographic guidance, 2 16 gauge core biopsies of the cortex of the lower pole of the left kidney were obtained. Final imaging was performed. Patient tolerated the procedure well without complication. Vital sign monitoring by nursing staff during the procedure will continue as patient is in the special procedures unit for post procedure observation. FINDINGS: The images document guide needle placement within the left lower pole renal cortex. Post biopsy images demonstrate no evidence of hemorrhage. IMPRESSION: Successful ultrasound-guided random renal cortex core biopsy. Electronically Signed   By: Marybelle Killings M.D.   On: 07/21/2016 14:36   Ir Fluoro Guide Cv Line Right  Result Date: 07/20/2016 INDICATION: End-stage renal disease. In need of durable intravenous access for the initiation of dialysis. EXAM: TUNNELED CENTRAL VENOUS HEMODIALYSIS CATHETER PLACEMENT WITH ULTRASOUND AND FLUOROSCOPIC GUIDANCE MEDICATIONS: Ancef 2 gm IV . The antibiotic was given in an appropriate time interval prior to skin puncture. ANESTHESIA/SEDATION: Versed 1 mg IV; Fentanyl 50 mcg IV; Moderate Sedation Time:  17 minutes The patient was continuously monitored during the procedure by the interventional radiology nurse under my direct supervision. FLUOROSCOPY TIME:  Fluoroscopy Time: 12 seconds (2 mGy). COMPLICATIONS: None immediate. PROCEDURE: Informed written consent was obtained from the patient after a discussion of the risks, benefits, and alternatives to treatment. Questions regarding the procedure were encouraged and answered. The right neck and chest were prepped with chlorhexidine in a sterile fashion, and a sterile drape was applied covering the operative field. Maximum barrier sterile technique with sterile gowns and gloves were used for the procedure. A timeout was performed  prior to the initiation of the procedure. After creating a small venotomy incision, a micropuncture kit was utilized to access the right internal jugular vein under direct, real-time ultrasound guidance after the overlying soft tissues were anesthetized with 1% lidocaine with epinephrine. Ultrasound image documentation was performed. The microwire was kinked to measure appropriate catheter length. A stiff Glidewire was advanced to the level of the IVC and the micropuncture sheath was exchanged for a peel-away sheath. A Palindrome tunneled hemodialysis catheter measuring 19 cm from tip to cuff was tunneled in a retrograde fashion from the anterior chest wall to the venotomy incision. The catheter was then placed through the peel-away sheath with tips ultimately positioned within the superior aspect of the right atrium. Final catheter positioning was confirmed and documented with a spot radiographic image. The catheter aspirates and flushes normally. The catheter was flushed with appropriate volume heparin dwells. The catheter exit site was secured with a 0-Prolene retention suture. The venotomy incision was closed with an interrupted 4-0 Vicryl, Dermabond and Steri-strips. Dressings were applied. The patient tolerated the procedure well without immediate post procedural complication. IMPRESSION: Successful placement of 19 cm tip to cuff tunneled hemodialysis catheter via the right internal jugular vein with tips terminating within the superior aspect of the right atrium. The catheter is ready for immediate use. Electronically Signed   By: [name] M.D.   On:  07/20/2016 17:12   Ir US Guide Vasc Access Right  Result Date: 07/20/2016 INDICATION: End-stage renal disease. In need of durable intravenous access for the initiation of dialysis. EXAM: TUNNELED CENTRAL VENOUS HEMODIALYSIS CATHETER PLACEMENT WITH ULTRASOUND AND FLUOROSCOPIC GUIDANCE MEDICATIONS: Ancef 2 gm IV . The antibiotic was given in an appropriate  time interval prior to skin puncture. ANESTHESIA/SEDATION: Versed 1 mg IV; Fentanyl 50 mcg IV; Moderate Sedation Time:  17 minutes The patient was continuously monitored during the procedure by the interventional radiology nurse under my direct supervision. FLUOROSCOPY TIME:  Fluoroscopy Time: 12 seconds (2 mGy). COMPLICATIONS: None immediate. PROCEDURE: Informed written consent was obtained from the patient after a discussion of the risks, benefits, and alternatives to treatment. Questions regarding the procedure were encouraged and answered. The right neck and chest were prepped with chlorhexidine in a sterile fashion, and a sterile drape was applied covering the operative field. Maximum barrier sterile technique with sterile gowns and gloves were used for the procedure. A timeout was performed prior to the initiation of the procedure. After creating a small venotomy incision, a micropuncture kit was utilized to access the right internal jugular vein under direct, real-time ultrasound guidance after the overlying soft tissues were anesthetized with 1% lidocaine with epinephrine. Ultrasound image documentation was performed. The microwire was kinked to measure appropriate catheter length. A stiff Glidewire was advanced to the level of the IVC and the micropuncture sheath was exchanged for a peel-away sheath. A Palindrome tunneled hemodialysis catheter measuring 19 cm from tip to cuff was tunneled in a retrograde fashion from the anterior chest wall to the venotomy incision. The catheter was then placed through the peel-away sheath with tips ultimately positioned within the superior aspect of the right atrium. Final catheter positioning was confirmed and documented with a spot radiographic image. The catheter aspirates and flushes normally. The catheter was flushed with appropriate volume heparin dwells. The catheter exit site was secured with a 0-Prolene retention suture. The venotomy incision was closed with an  interrupted 4-0 Vicryl, Dermabond and Steri-strips. Dressings were applied. The patient tolerated the procedure well without immediate post procedural complication. IMPRESSION: Successful placement of 19 cm tip to cuff tunneled hemodialysis catheter via the right internal jugular vein with tips terminating within the superior aspect of the right atrium. The catheter is ready for immediate use. Electronically Signed   By: [name] M.D.   On: 07/20/2016 17:12   Dg Chest Port 1 View  Result Date: 07/26/2016 CLINICAL DATA:  Pulmonary nodules EXAM: PORTABLE CHEST 1 VIEW COMPARISON:  06/19/2016, chest CT 06/20/2016 FINDINGS: Right dialysis catheter is in place with the tip at the cavoatrial junction. Postoperative changes on the left. Areas of scarring and nodularity in the left lung are stable. Decreasing airspace opacities in the right lung with some residual areas of nodularity. Heart is borderline in size. No visible effusions. IMPRESSION: Stable scarring and nodularity in the left lung with postoperative changes. Improving areas of nodularity in the right lung. Electronically Signed   By: Rolm Baptise M.D.   On: 07/26/2016 16:30   Ct Maxillofacial Wo Contrast  Result Date: 07/20/2016 CLINICAL DATA:  71 y/o  F; recurrent sinusitis. EXAM: CT MAXILLOFACIAL WITHOUT CONTRAST TECHNIQUE: Multidetector CT imaging of the maxillofacial structures was performed. Multiplanar CT image reconstructions were also generated. A small metallic BB was placed on the right temple in order to reliably differentiate right from left. COMPARISON:  03/13/2016 MRI head.  12/02/2015 CT head. FINDINGS: Frontal sinus: Normally aerated.  Patent frontal sinus drainage pathways. Ethmoid sinus: Normally aerated. Maxillary sinuses:  Normally aerated. Sphenoid sinus: Normally aerated. Patent sphenoid ethmoidal recesses. Right ostiomeatal unit:  Patent. Left ostiomeatal unit:  Patent. Nasal passages: Patent. Intact nasal septum. Mild leftward  nasal septal deviation and small spur. Other: Orbits and intracranial compartment are unremarkable. Mastoid air cells are normally pneumatized. IMPRESSION: Normally aerated paranasal sinuses and patent sinus drainage pathways. Electronically Signed   By: Kristine Garbe M.D.   On: 07/20/2016 14:22    Time Spent in minutes  25   Velvet Bathe M.D on 08/05/2016 at 2:53 PM  Between 7am to 7pm - Pager - [phone]  After 7pm go to [email or web address] - password [facility]  [facility] -  Office  [phone]

## 2016-08-06 LAB — CBC
HEMATOCRIT: 26.4 % — AB (ref 36.0–46.0)
HEMOGLOBIN: 9 g/dL — AB (ref 12.0–15.0)
MCH: 29.9 pg (ref 26.0–34.0)
MCHC: 34.1 g/dL (ref 30.0–36.0)
MCV: 87.7 fL (ref 78.0–100.0)
Platelets: 42 10*3/uL — ABNORMAL LOW (ref 150–400)
RBC: 3.01 MIL/uL — AB (ref 3.87–5.11)
RDW: 18.7 % — ABNORMAL HIGH (ref 11.5–15.5)
WBC: 8 10*3/uL (ref 4.0–10.5)

## 2016-08-06 LAB — RENAL FUNCTION PANEL
Albumin: 3.4 g/dL — ABNORMAL LOW (ref 3.5–5.0)
Anion gap: 12 (ref 5–15)
BUN: 34 mg/dL — AB (ref 6–20)
CHLORIDE: 99 mmol/L — AB (ref 101–111)
CO2: 27 mmol/L (ref 22–32)
Calcium: 8.3 mg/dL — ABNORMAL LOW (ref 8.9–10.3)
Creatinine, Ser: 5.09 mg/dL — ABNORMAL HIGH (ref 0.44–1.00)
GFR, EST AFRICAN AMERICAN: 9 mL/min — AB (ref >60)
GFR, EST NON AFRICAN AMERICAN: 8 mL/min — AB (ref >60)
Glucose, Bld: 137 mg/dL — ABNORMAL HIGH (ref 65–99)
POTASSIUM: 3.5 mmol/L (ref 3.5–5.1)
Phosphorus: 2.8 mg/dL (ref 2.5–4.6)
Sodium: 138 mmol/L (ref 135–145)

## 2016-08-06 MED ORDER — DARBEPOETIN ALFA 150 MCG/0.3ML IJ SOSY
PREFILLED_SYRINGE | INTRAMUSCULAR | Status: AC
  Filled 2016-08-06: qty 0.3

## 2016-08-06 MED ORDER — ZOLPIDEM TARTRATE 5 MG PO TABS
5.0000 mg | ORAL_TABLET | Freq: Every evening | ORAL | Status: DC | PRN
  Administered 2016-08-06 – 2016-08-14 (×6): 5 mg via ORAL
  Filled 2016-08-06 (×6): qty 1

## 2016-08-06 NOTE — Progress Notes (Signed)
Even after dulcolax suppository pt unable to pass BM, pt is refusing for any other interventions now, will continue to monitor

## 2016-08-06 NOTE — Progress Notes (Addendum)
Pt arrived at unit from HD at 11.30 am, this is the first time RN saw her since morning. When RN was here this am pt was already off the floor for HD

## 2016-08-06 NOTE — Procedures (Signed)
I have personally attended this patient's dialysis session.   TDC no issues No heparin 3K bath  2 liter goal SBP 150  Jamal Maes, MD [facility] [phone] Pager 08/06/2016, 7:51 AM

## 2016-08-06 NOTE — Progress Notes (Signed)
PROGRESS NOTE                                                                                                                                                                                                             Patient Demographics:    [name], is a 71 y.o. female, DOB - 1946/01/22, ZOX:[id number]  Admit date - 07/20/2016   Admitting Physician Waldemar Dickens, MD  Outpatient Primary MD for the patient is [facility], [place]  LOS - 17  Outpatient Specialists: NONE  No chief complaint on file.      Brief Narrative   72 y.o.femalewith medical history significant for COPD, esophageal stricture, GERD, hiatal hernia, hyperlipidemia, hypertension, IBS, cervical disc disease and history of lung nodules with extensive workup including lung biopsy ruling out Sjogren's disease. Pt has had recurrent history of sinusitis since last fall, treated with several rounds of antibiotics and corticosteroids which have not resulted in significant clinical improvement. She was hospitalized 2 months ago for fever/weight loss and relative hypotension with anorexia and night sweats and was treated for community-acquired pneumonia. She has continued to have sinus drainage with some epistaxis and bloody secretions and also had nausea with dysgeusia for the past few weeks or so. Pt also reported severe muscle pain and leg cramps over the same time period. Of note, pt is followed by Dr. Elsworth Soho for abnormal CT imaging &pulmonary nodules. Work up for this has included an open lung biopsy in 11/2008 in the setting of fever/night sweats and CT scan with pulmonary nodules which showed possible inflammatory pseudotumor vs reaction to old bronchopneumonia associated with some carcinoid tumorlets. Salivary  gland biospy 03/2009 was negative for Sjogren's disease and negative for autoimmune work up. Serial CT's were planned but she was lost to follow  up. She had a repeat CT of the chest in 06/2016 which showed volume loss, post surgical changes of the left hemithorax and RLL pulmonary nodules.   In regards to pt's creatinine, in January of 2018 was 1.7 and in February 2018 it ranged between 1.6 and 2.1 with a peak of 2.8 that prompted referral to nephrology. After evaluation 07/20/16, she was found to have an elevated creatinine level of 12.4 and given the high risk of suspicion for vasculitis pt was directly admitted to hospital for evaluation.  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 She was on solumedrol and then switched to PO prednisone. She is required to have plasmapheresis on alternate days for 2 weeks (started 07/22/2016).   Subjective:   Reports no new complaints.   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 Renal started on Cyclophosphamide 100 mg PO QD.  - She has outpt HD established ,  TTS. ( has spot at [facility]) - Nephrology managing.  - repeat anti GBM of 34.  -has outpatient hemodialysis established. renal want to wait a little before perm access .( If no return to renal function in next 4 weeks).  Essential hypertension - Continue Norvasc  -Improved.  Anemia of chronic renal disease -Aranesp when necessary with dialysis. Significantly  improved hemoglobin today.  Hyperphosphatemia - Low phosphorus diet,  monitor with hemodialysis - PTH level 109  Thrombocytopenia Mild. Monitor  Leukocytosis Possibly due to steroid. No signs of infection.  Dyslipidemia - Continue Lipitor   RLL pulmonary nodules - Pulm recommended prophylactic bactrim while on immunosuppresion - Repeat CT imaging done this admission shows unchanged pulmonary nodule. If comparative CT in 2 months unchanged and no evidence of renal recover, doubt that there is benefit of continued therapy. - Monitor for hemoptysis - Follow up with Dr. Elsworth Soho at discharge >had prior PET arranged for May 2018 to review pulmonary nodules .   Constipation Not relieved with  Dulcolax suppository and MiraLAX. Ordered tap water enema.  Code Status : Full code  Family Communication  : None at bedside  Disposition Plan  : Home  once no further plasma exchange needed   Barriers For Discharge : Active symptoms, once cleared by Nephrology for d/c  Consults  :   Nephrology Pulmonary IR    Procedures    RIJ cathter for HD placed 07/20/2016  Renal biopsy 07/21/2016  Plasmapheresis 07/22/16  HD TTS  Cytoxan - started 07/27/16  DVT Prophylaxis  :  SCDs ( no heparin for 1 month post renal bx)  Lab Results  Component Value Date   PLT 42 (L) 08/06/2016    Antibiotics  :    Anti-infectives    Start     Dose/Rate Route Frequency Ordered Stop   07/27/16 0900  sulfamethoxazole-trimethoprim (BACTRIM DS,SEPTRA DS) 800-160 MG per tablet 1 tablet     1 tablet Oral Once per day on Mon Wed Fri 07/26/16 1639     07/20/16 1430  ceFAZolin (ANCEF) IVPB 2g/100 mL premix     2 g 200 mL/hr over 30 Minutes Intravenous To Radiology 07/20/16 1424 07/20/16 1641        Objective:   Vitals:   08/06/16 0930 08/06/16 1000 08/06/16 1030 08/06/16 1157  BP: (!) 143/42 (!) 144/85 (!) 158/49 (!) 141/58  Pulse: 66 66 65 67  Resp: _0 Temp:    99 F (37.2 C)  TempSrc:    Oral  SpO2: 100%  100%  Weight:      Height:        Wt  Readings from Last 3 Encounters:  08/06/16 56.7 kg (125 lb)  06/29/16 65.8 kg (145 lb)  06/19/16 64 kg (141 lb)     Intake/Output Summary (Last 24 hours) at 08/06/16 1726 Last data filed at 08/06/16 1600  Gross per 24 hour  Intake                0 ml  Output             2001 ml  Net            -2001 ml     Physical Exam  Gen: alert and awake, in nad. HEENT: moist mucosa,  rt temp HD catheter, atraumatic, normocephalic Chest: clear b/l, no wheezes, equal chest rise. CVS: N S1&S2, no murmurs,  GI: soft, NT, ND, Musculoskeletal: warm, no edema     Data Review:    CBC  Recent Labs Lab 08/02/16 0737 08/03/16 0702 08/04/16 0731 08/05/16 1222 08/06/16 0431  WBC 12.8* 9.2 13.9* 10.9* 8.0  HGB 9.5* 9.9* 9.3* 9.6* 9.0*  HCT 27.5* 28.6* 26.9* 28.5* 26.4*  PLT 72* 54* 62* 42* 42*  MCV 87.6 87.7 86.5 88.0 87.7  MCH 30.3 30.4 29.9 29.6 29.9  MCHC 34.5 34.6 34.6 33.7 34.1  RDW 17.4* 17.6* 18.0* 18.2* 18.7*    Chemistries   Recent Labs Lab 08/02/16 0602 08/03/16 0702 08/04/16 0539 08/05/16 0551 08/06/16 0431  NA 137 134* 134* 136 138  K 4.1 4.1 3.9 4.1 3.5  CL 103 99* 100* 99* 99*  CO2 19* _0 GLUCOSE 91 110* 132* 94 137*  BUN 42* 23* 40* 17 34*  CREATININE 5.35* 3.63* 5.15* 3.53* 5.09*  CALCIUM 8.5* 8.3* 8.4* 8.5* 8.3*   ------------------------------------------------------------------------------------------------------------------ No results for input(s): CHOL, HDL, LDLCALC, TRIG, CHOLHDL, LDLDIRECT in the last 72 hours.  Lab Results  Component Value Date   HGBA1C 5.8 02/02/2010   ------------------------------------------------------------------------------------------------------------------ No results for input(s): TSH, T4TOTAL, T3FREE, THYROIDAB in the last 72 hours.  Invalid input(s): FREET3 ------------------------------------------------------------------------------------------------------------------ No results for input(s):  VITAMINB12, FOLATE, FERRITIN, TIBC, IRON, RETICCTPCT in the last 72 hours.  Coagulation profile No results for input(s): INR, PROTIME in the last 168 hours.  No results for input(s): DDIMER in the last 72 hours.  Cardiac Enzymes No results for input(s): CKMB, TROPONINI, MYOGLOBIN in the last 168 hours.  Invalid input(s): CK ------------------------------------------------------------------------------------------------------------------ No results found for: BNP  Inpatient Medications  Scheduled Meds: . sodium chloride   Intravenous Once  . sodium chloride   Intravenous Once  . amLODipine  5 mg Oral Daily  . aspirin EC  81 mg Oral Daily  . atorvastatin  10 mg Oral q1800  . bisacodyl  10 mg Rectal Daily  . Darbepoetin Alfa      . darbepoetin (ARANESP) injection - DIALYSIS  150 mcg Intravenous Q Sat-HD  . feeding supplement  1 Container Oral BID BM  . ferric gluconate (FERRLECIT/NULECIT) IV  125 mg Intravenous Q T,Th,Sa-HD  . fluticasone  2 spray Each Nare QHS  . loratadine  10 mg Oral Daily  . pantoprazole  40 mg Oral Daily  . polyethylene glycol  17 g Oral Daily  . predniSONE  40 mg Oral Q breakfast  . sodium chloride  2 spray Each Nare TID  . sodium chloride flush  3 mL Intravenous Q12H  . sulfamethoxazole-trimethoprim  1 tablet Oral Once per  day on Mon Wed Fri   Continuous Infusions:  PRN Meds:.sodium chloride, calcium carbonate, methocarbamol (ROBAXIN)  IV, ondansetron **OR** ondansetron (ZOFRAN) IV, senna, sodium chloride flush  Micro Results No results found for this or any previous visit (from the past 240 hour(s)).  Radiology Reports Ct Chest Wo Contrast  Result Date: 07/29/2016 CLINICAL DATA:  71 year old female inpatient admitted for 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 Longstanding history of indeterminate pulmonary nodules, described as inflammatory pseudotumors on surgical lung biopsy of the left lung on 11/06/2008. EXAM: CT  CHEST WITHOUT CONTRAST TECHNIQUE: Multidetector CT imaging of the chest was performed following the standard protocol without IV contrast. COMPARISON:  06/20/2016 chest CT.  07/26/2016 chest radiograph. FINDINGS: Cardiovascular: Top-normal heart size . No significant pericardial fluid/thickening. Right internal jugular dialysis catheter terminates at the cavoatrial junction. Atherosclerotic nonaneurysmal thoracic aorta. Stable dilated pulmonary arteries (main pulmonary artery diameter 3.4 cm). Mediastinum/Nodes: Stable heterogeneous thyroid gland with possible confluent small hypodense thyroid nodules. 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 No pathologically enlarged axillary, mediastinal or gross hilar lymph nodes, noting limited sensitivity for the detection of hilar adenopathy on this noncontrast study. Lungs/Pleura: No pneumothorax. New trace dependent bilateral pleural effusions. Stable postsurgical changes from wedge resection in the left upper and left lower lobes. Mild centrilobular and paraseptal emphysema with mild diffuse bronchial wall thickening. No acute consolidative airspace disease or new significant pulmonary nodules. Stable parenchymal band in the posterior right upper lobe with associated distortion, compatible with postinfectious/postinflammatory scarring. Stable chronic thickening of the peribronchovascular interstitium throughout both lungs with associated chronic mild distortion. Scattered irregular pulmonary nodules in the right lung are unchanged in the interval and not convincingly changed back to 12/04/2009 chest CT, compatible with benign inflammatory nodules. For example a 1.5 x 1.0 cm peripheral right upper lobe nodule (series 4/ image 41), measuring 1.5 x 1.0 cm on 06/20/2016 and 1.5 x 0.9 cm on 12/04/2009. Partially cystic irregular right lower lobe 2.2 x 1.7 cm nodule, measuring 2.3 x  1.8 cm on 06/20/2016 and 2.3 x 1.9 cm on 12/04/2009. Upper abdomen: Unremarkable. Musculoskeletal: No aggressive appearing focal osseous lesions. Mild thoracic spondylosis. New mild anasarca. IMPRESSION: 1. New mild anasarca and trace dependent bilateral pleural effusions. Top-normal heart size. 2. Scattered irregular pulmonary nodules in the right lung are stable in the interval and not convincingly changed back to 2011, compatible with benign inflammatory nodules given the results of the 2010 surgical left lung biopsy. 3. Stable postsurgical and postinflammatory scarring in both lungs. No acute pulmonary disease. 4. Mild emphysema with mild diffuse bronchial wall thickening, suggesting COPD. 5. Aortic atherosclerosis. 6. Stable dilated main pulmonary artery, suggesting pulmonary arterial hypertension. Electronically Signed   By: Ilona Sorrel M.D.   On: 07/29/2016 15:48   US Biopsy  Result Date: 07/21/2016 INDICATION: Acute renal failure EXAM: ULTRASOUND-GUIDED BIOPSY OF THE RENAL CORTEX.  RANDOM CORE. MEDICATIONS: None. ANESTHESIA/SEDATION: Fentanyl 50 mcg IV; Versed 1 mg IV Moderate Sedation Time:  10 The patient was continuously monitored during the procedure by the interventional radiology nurse under my direct supervision. FLUOROSCOPY TIME:  None. COMPLICATIONS: None immediate. PROCEDURE: Informed written consent was obtained from the patient after a thorough discussion of the procedural risks, benefits and alternatives. All questions were addressed. Maximal Sterile Barrier Technique was utilized including caps, mask, sterile gowns, sterile gloves, sterile drape, hand hygiene and skin antiseptic. A timeout was  performed prior to the initiation of the procedure. The back was prepped with ChloraPrep in a sterile fashion, and a sterile drape was applied covering the operative field. A sterile gown and sterile gloves were used for the procedure. Under sonographic guidance, 2 16 gauge core biopsies of the  cortex of the lower pole of the left kidney were obtained. Final imaging was performed. Patient tolerated the procedure well without complication. Vital sign monitoring by nursing staff during the procedure will continue as patient is in the special procedures unit for post procedure observation. FINDINGS: The images document guide needle placement within the left lower pole renal cortex. Post biopsy images demonstrate no evidence of hemorrhage. IMPRESSION: Successful ultrasound-guided random renal cortex core biopsy. Electronically Signed   By: Marybelle Killings M.D.   On: 07/21/2016 14:36   Ir Fluoro Guide Cv Line Right  Result Date: 07/20/2016 INDICATION: End-stage renal disease. In need of durable intravenous access for the initiation of dialysis. EXAM: TUNNELED CENTRAL VENOUS HEMODIALYSIS CATHETER PLACEMENT WITH ULTRASOUND AND FLUOROSCOPIC GUIDANCE MEDICATIONS: Ancef 2 gm IV . The antibiotic was given in an appropriate time interval prior to skin puncture. ANESTHESIA/SEDATION: Versed 1 mg IV; Fentanyl 50 mcg IV; Moderate Sedation Time:  17 minutes The patient was continuously monitored during the procedure by the interventional radiology nurse under my direct supervision. FLUOROSCOPY TIME:  Fluoroscopy Time: 12 seconds (2 mGy). COMPLICATIONS: None immediate. PROCEDURE: Informed written consent was obtained from the patient after a discussion of the risks, benefits, and alternatives to treatment. Questions regarding the procedure were encouraged and answered. The right neck and chest were prepped with chlorhexidine in a sterile fashion, and a sterile drape was applied covering the operative field. Maximum barrier sterile technique with sterile gowns and gloves were used for the procedure. A timeout was performed prior to the initiation of the procedure. After creating a small venotomy incision, a micropuncture kit was utilized to access the right internal jugular vein under direct, real-time ultrasound guidance  after the overlying soft tissues were anesthetized with 1% lidocaine with epinephrine. Ultrasound image documentation was performed. The microwire was kinked to measure appropriate catheter length. A stiff Glidewire was advanced to the level of the IVC and the micropuncture sheath was exchanged for a peel-away sheath. A Palindrome tunneled hemodialysis catheter measuring 19 cm from tip to cuff was tunneled in a retrograde fashion from the anterior chest wall to the venotomy incision. The catheter was then placed through the peel-away sheath with tips ultimately positioned within the superior aspect of the right atrium. Final catheter positioning was confirmed and documented with a spot radiographic image. The catheter aspirates and flushes normally. The catheter was flushed with appropriate volume heparin dwells. The catheter exit site was secured with a 0-Prolene retention suture. The venotomy incision was closed with an interrupted 4-0 Vicryl, Dermabond and Steri-strips. Dressings were applied. The patient tolerated the procedure well without immediate post procedural complication. IMPRESSION: Successful placement of 19 cm tip to cuff tunneled hemodialysis catheter via the right internal jugular vein with tips terminating within the superior aspect of the right atrium. The catheter is ready for immediate use. Electronically Signed   By: [name] M.D.   On: 07/20/2016 17:12   Ir US Guide Vasc Access Right  Result Date: 07/20/2016 INDICATION: End-stage renal disease. In need of durable intravenous access for the initiation of dialysis. EXAM: TUNNELED CENTRAL VENOUS HEMODIALYSIS CATHETER PLACEMENT WITH ULTRASOUND AND FLUOROSCOPIC GUIDANCE MEDICATIONS: Ancef 2 gm IV . The antibiotic was given  in an appropriate time interval prior to skin puncture. ANESTHESIA/SEDATION: Versed 1 mg IV; Fentanyl 50 mcg IV; Moderate Sedation Time:  17 minutes The patient was continuously monitored during the procedure by the  interventional radiology nurse under my direct supervision. FLUOROSCOPY TIME:  Fluoroscopy Time: 12 seconds (2 mGy). COMPLICATIONS: None immediate. PROCEDURE: Informed written consent was obtained from the patient after a discussion of the risks, benefits, and alternatives to treatment. Questions regarding the procedure were encouraged and answered. The right neck and chest were prepped with chlorhexidine in a sterile fashion, and a sterile drape was applied covering the operative field. Maximum barrier sterile technique with sterile gowns and gloves were used for the procedure. A timeout was performed prior to the initiation of the procedure. After creating a small venotomy incision, a micropuncture kit was utilized to access the right internal jugular vein under direct, real-time ultrasound guidance after the overlying soft tissues were anesthetized with 1% lidocaine with epinephrine. Ultrasound image documentation was performed. The microwire was kinked to measure appropriate catheter length. A stiff Glidewire was advanced to the level of the IVC and the micropuncture sheath was exchanged for a peel-away sheath. A Palindrome tunneled hemodialysis catheter measuring 19 cm from tip to cuff was tunneled in a retrograde fashion from the anterior chest wall to the venotomy incision. The catheter was then placed through the peel-away sheath with tips ultimately positioned within the superior aspect of the right atrium. Final catheter positioning was confirmed and documented with a spot radiographic image. The catheter aspirates and flushes normally. The catheter was flushed with appropriate volume heparin dwells. The catheter exit site was secured with a 0-Prolene retention suture. The venotomy incision was closed with an interrupted 4-0 Vicryl, Dermabond and Steri-strips. Dressings were applied. The patient tolerated the procedure well without immediate post procedural complication. IMPRESSION: Successful placement of  19 cm tip to cuff tunneled hemodialysis catheter via the right internal jugular vein with tips terminating within the superior aspect of the right atrium. The catheter is ready for immediate use. Electronically Signed   By: [name] M.D.   On: 07/20/2016 17:12   Dg Chest Port 1 View  Result Date: 07/26/2016 CLINICAL DATA:  Pulmonary nodules EXAM: PORTABLE CHEST 1 VIEW COMPARISON:  06/19/2016, chest CT 06/20/2016 FINDINGS: Right dialysis catheter is in place with the tip at the cavoatrial junction. Postoperative changes on the left. Areas of scarring and nodularity in the left lung are stable. Decreasing airspace opacities in the right lung with some residual areas of nodularity. Heart is borderline in size. No visible effusions. IMPRESSION: Stable scarring and nodularity in the left lung with postoperative changes. Improving areas of nodularity in the right lung. Electronically Signed   By: Rolm Baptise M.D.   On: 07/26/2016 16:30   Ct Maxillofacial Wo Contrast  Result Date: 07/20/2016 CLINICAL DATA:  71 y/o  F; recurrent sinusitis. EXAM: CT MAXILLOFACIAL WITHOUT CONTRAST TECHNIQUE: Multidetector CT imaging of the maxillofacial structures was performed. Multiplanar CT image reconstructions were also generated. A small metallic BB was placed on the right temple in order to reliably differentiate right from left. COMPARISON:  03/13/2016 MRI head.  12/02/2015 CT head. FINDINGS: Frontal sinus: Normally aerated. Patent frontal sinus drainage pathways. Ethmoid sinus: Normally aerated. Maxillary sinuses:  Normally aerated. Sphenoid sinus: Normally aerated. Patent sphenoid ethmoidal recesses. Right ostiomeatal unit:  Patent. Left ostiomeatal unit:  Patent. Nasal passages: Patent. Intact nasal septum. Mild leftward nasal septal deviation and small spur. Other: Orbits and intracranial compartment  are unremarkable. Mastoid air cells are normally pneumatized. IMPRESSION: Normally aerated paranasal sinuses and patent  sinus drainage pathways. Electronically Signed   By: Kristine Garbe M.D.   On: 07/20/2016 14:22    Time Spent in minutes  25   Velvet Bathe M.D on 08/06/2016 at 5:26 PM  Between 7am to 7pm - Pager - [phone]  After 7pm go to [email or web address] - password [facility]  [facility] -  Office  [phone]

## 2016-08-06 NOTE — Progress Notes (Signed)
Patient ID: [name], female   DOB: Feb 17, 1946, 72 y.o.   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 (from a high of 123 pre TPE) Thrombocytopenia->stopped cytoxan No return renal function Tapering steroids On TTS HD Schedule No newe c/o   Objective:   BP (!) 155/78 (BP Location: Right Arm)   Pulse 73   Temp 98 F (36.7 C) (Oral)   Resp 17   Ht 5\' 3"  (1.6 m)   Wt 56.7 kg (125 lb)   SpO2 100%   BMI 22.14 kg/m   Intake/Output Summary (Last 24 hours) at 08/06/16 0741 Last data filed at 08/06/16 0600  Gross per 24 hour  Intake              360 ml  Output                0 ml  Net              360 ml   Weight change: 0.499 kg (1 lb 1.6 oz)  Physical Exam: Seen in HD VS as noted Right IJ PC  Regular rhythm, normal S1 and S2 Clear to auscultation, no rales/rhonchi Abd Soft, flat, nontender No lower extremity edema Tender dorsum R foot but no inflammatory change  Recent Labs Lab 07/31/16 0538 08/01/16 0630 08/01/16 1016 08/02/16 0602 08/03/16 0702 08/04/16 0539 08/05/16 0551 08/06/16 0431  NA 137 137 136 137 134* 134* 136 138  K 3.5 4.1 3.8 4.1 4.1 3.9 4.1 3.5  CL 98* 100* 96* 103 99* 100* 99* 99*  CO2 28 25  --  19* 24 25 27 27   GLUCOSE 86 99 93 91 110* 132* 94 137*  BUN 12 29* 31* 42* 23* 40* 17 34*  CREATININE 2.59* 4.27* 4.50* 5.35* 3.63* 5.15* 3.53* 5.09*  CALCIUM 8.5* 8.4*  --  8.5* 8.3* 8.4* 8.5* 8.3*  PHOS 2.1* 4.2  --  4.9* 3.8 2.8 2.8 2.8     Recent Labs Lab 08/03/16 0702 08/04/16 0731 08/05/16 1222 08/06/16 0431  WBC 9.2 13.9* 10.9* 8.0  HGB 9.9* 9.3* 9.6* 9.0*  HCT 28.6* 26.9* 28.5* 26.4*  MCV 87.7 86.5 88.0 87.7  PLT 54* 62* 42* 42*   Medications:    . sodium chloride   Intravenous Once  . sodium chloride   Intravenous Once  . amLODipine  5 mg Oral Daily  . aspirin EC  81 mg Oral Daily  . atorvastatin  10 mg Oral q1800  . bisacodyl  10 mg  Rectal Daily  . darbepoetin (ARANESP) injection - DIALYSIS  150 mcg Intravenous Q Sat-HD  . feeding supplement  1 Container Oral BID BM  . ferric gluconate (FERRLECIT/NULECIT) IV  125 mg Intravenous Q T,Th,Sa-HD  . fluticasone  2 spray Each Nare QHS  . loratadine  10 mg Oral Daily  . pantoprazole  40 mg Oral Daily  . polyethylene glycol  17 g Oral Daily  . predniSONE  40 mg Oral Q breakfast  . sodium chloride  2 spray Each Nare TID  . sodium chloride flush  3 mL Intravenous Q12H  . sulfamethoxazole-trimethoprim  1 tablet Oral Once per day on Mon Wed Fri     Assessment/ Plan:    1. 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  Had TPE X 8 tmts starting 3/16. Reduced GBM AB 125->33. Thrombocytopenia necessitated stopping cytoxan. Still on steroids. No sign of renal recovery.  1. In looking at the history, process probably brewing since late January (creat then 1.7, active urine sediment), and creatinine was 12.5 at time of dx. VERY poor renal recovery prognosis. I think now ESRD. Should probably plan not to restart cytoxan unless pulm thinks needs (can't now anyway - plts low). When platelets recover, get permanent access in. Continue dialysis on a TTS schedule--- has spot TTS at [facility].  No heparin with HD for at least a month post renal biopsy date (which was 07/21/16) and of course while platelets low 2. Hypertension:  Good 3. Anemia: 2/2 CKD + cytoxan.  Low iron saturation/high ferritin (1100) limited Fe load reasonable. (5 doses ferrlecit with HD) On Aranesp 150/week with hemodialysis. Transfused 3/20, 3/24.  4. Hyperphosphatemia: phos OK no binder-  PTH 109, continue to monitor- no vit D. 5. Upper respiratory symptoms- some sinus sx still present but improved.  1. Will need f/u with pulm for lung nodules and also  their opinion about whether cytoxan would need to be restarted from a pulmonary perspective once plts back up (I have no plan to resume for renal purposes)  [name] Maes, MD [place] Pager 08/06/2016, 7:41 AM

## 2016-08-07 LAB — RENAL FUNCTION PANEL
ANION GAP: 10 (ref 5–15)
Albumin: 3.4 g/dL — ABNORMAL LOW (ref 3.5–5.0)
BUN: 15 mg/dL (ref 6–20)
CHLORIDE: 99 mmol/L — AB (ref 101–111)
CO2: 28 mmol/L (ref 22–32)
CREATININE: 3.41 mg/dL — AB (ref 0.44–1.00)
Calcium: 7.9 mg/dL — ABNORMAL LOW (ref 8.9–10.3)
GFR calc non Af Amer: 12 mL/min — ABNORMAL LOW (ref >60)
GFR, EST AFRICAN AMERICAN: 14 mL/min — AB (ref >60)
GLUCOSE: 98 mg/dL (ref 65–99)
Phosphorus: 2.6 mg/dL (ref 2.5–4.6)
Potassium: 3.4 mmol/L — ABNORMAL LOW (ref 3.5–5.1)
Sodium: 137 mmol/L (ref 135–145)

## 2016-08-07 LAB — CBC
HCT: 28 % — ABNORMAL LOW (ref 36.0–46.0)
HEMOGLOBIN: 9.5 g/dL — AB (ref 12.0–15.0)
MCH: 30.6 pg (ref 26.0–34.0)
MCHC: 33.9 g/dL (ref 30.0–36.0)
MCV: 90.3 fL (ref 78.0–100.0)
Platelets: 47 10*3/uL — ABNORMAL LOW (ref 150–400)
RBC: 3.1 MIL/uL — AB (ref 3.87–5.11)
RDW: 20.5 % — ABNORMAL HIGH (ref 11.5–15.5)
WBC: 6.7 10*3/uL (ref 4.0–10.5)

## 2016-08-07 MED ORDER — PREDNISONE 20 MG PO TABS
20.0000 mg | ORAL_TABLET | Freq: Every day | ORAL | Status: DC
  Administered 2016-08-08 – 2016-08-14 (×7): 20 mg via ORAL
  Filled 2016-08-07 (×7): qty 1

## 2016-08-07 NOTE — Progress Notes (Signed)
[facility] Progress Note   Subjective:   Seen in room. No c/o today, feels well. No CP or dyspnea. Has tolerated dialysis without issues.   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 2 determinations (from a high of 123 pre TPE) Thrombocytopenia->stopped cytoxan No return renal function Tapering steroids On TTS HD Schedule   Intake/Output Summary (Last 24 hours) at 08/07/16 1537 Last data filed at 08/07/16 0600  Gross per 24 hour  Intake              170 ml  Output                0 ml  Net              170 ml    Objective Vitals:   08/06/16 1030 08/06/16 1157 08/06/16 2131 08/07/16 0504  BP: (!) 158/49 (!) 141/58 135/80 (!) 150/68  Pulse: 65 67 77 68  Resp: 18 18 18 18   Temp:  99 F (37.2 C) 98.1 F (36.7 C) 99.2 F (37.3 C)  TempSrc:  Oral Oral Oral  SpO2:  100% 100% 100%  Weight:   56.9 kg (125 lb 7.1 oz)   Height:       Physical Exam General: Well appearing, NAD Heart: RRR; no murmur Lungs: CTAB Extremities: No LE edema Dialysis Access:  R [facility]  Additional Objective Labs: Basic Metabolic Panel:  Recent Labs Lab 08/05/16 0551 08/06/16 0431 08/07/16 0437  NA 136 138 137  K 4.1 3.5 3.4*  CL 99* 99* 99*  CO2 27 27 28   GLUCOSE 94 137* 98  BUN 17 34* 15  CREATININE 3.53* 5.09* 3.41*  CALCIUM 8.5* 8.3* 7.9*  PHOS 2.8 2.8 2.6     Recent Labs Lab 08/05/16 0551 08/06/16 0431 08/07/16 0437  ALBUMIN 3.5 3.4* 3.4*      Recent Labs Lab 08/03/16 0702 08/04/16 0731 08/05/16 1222 08/06/16 0431 08/07/16 0437  WBC 9.2 13.9* 10.9* 8.0 6.7  HGB 9.9* 9.3* 9.6* 9.0* 9.5*  HCT 28.6* 26.9* 28.5* 26.4* 28.0*  MCV 87.7 86.5 88.0 87.7 90.3  PLT 54* 62* 42* 42* 47*   Medications:  . sodium chloride   Intravenous Once  . sodium chloride   Intravenous Once  . amLODipine  5 mg Oral Daily  . aspirin EC  81 mg Oral Daily  . atorvastatin  10 mg Oral q1800  . bisacodyl  10 mg Rectal Daily  . darbepoetin (ARANESP) injection -  DIALYSIS  150 mcg Intravenous Q Sat-HD  . feeding supplement  1 Container Oral BID BM  . ferric gluconate (FERRLECIT/NULECIT) IV  125 mg Intravenous Q T,Th,Sa-HD  . fluticasone  2 spray Each Nare QHS  . loratadine  10 mg Oral Daily  . pantoprazole  40 mg Oral Daily  . polyethylene glycol  17 g Oral Daily  . predniSONE  40 mg Oral Q breakfast  . sodium chloride  2 spray Each Nare TID  . sodium chloride flush  3 mL Intravenous Q12H  . sulfamethoxazole-trimethoprim  1 tablet Oral Once per day on Mon Wed Fri   Assessment/Plan: 1. 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  Had TPE X 8 tmts starting 3/16. Reduced GBM AB 125->33. Thrombocytopenia necessitated stopping cytoxan. Still on steroids. Reduce prednisone to 20 mg/day/. No sign of renal  recovery.  1. In looking at the history, process probably brewing since late January (creat then 1.7, active urine sediment), and creatinine was 12.5 at time of dx. VERY poor renal recovery prognosis. I think now ESRD. Should probably plan not to restart cytoxan unless pulm thinks needs (can't now anyway - plts low). When platelets recover, get permanent access in. Continue dialysis on a TTS schedule, next 4/3--- has spot TTS at [facility].  No heparin with HD for at least a month post renal biopsy date (which was 07/21/16) and of course while platelets low 2. Hypertension: Reasonably controlled, no edema. 3. Anemia: 2/2 CKD + cytoxan.  Low iron saturation/high ferritin (1100) limited Fe load reasonable. (5 doses ferrlecit with HD) On Aranesp 150/week with hemodialysis. Transfused 3/20, 3/24.  4. Hyperphosphatemia: Phos now OK without binder. PTH 109, continue to monitor- no vit D. 5. Upper respiratory symptoms- some sinus sx still present but improved.  1. Will need f/u with pulm  for lung nodules and also their opinion about whether cytoxan would need to be restarted from a pulmonary perspective once plts back up (I have no plan to resume for renal purposes)  Veneta Penton, PA-C 08/07/2016, 3:35 PM  [facility] Pager: [phone]  I have seen and examined this patient and agree with plan and assessment in the above note with renal recommendations/intervention highlighted.   Sabrea Sankey B,MD 08/07/2016 3:51 PM

## 2016-08-07 NOTE — Progress Notes (Signed)
PROGRESS NOTE                                                                                                                                                                                                             Patient Demographics:    [name], is a 71 y.o. female, DOB - 01/07/1946, XBW:[id number]  Admit date - 07/20/2016   Admitting Physician Waldemar Dickens, MD  Outpatient Primary MD for the patient is [facility], MD  LOS - 18  Outpatient Specialists: NONE  No chief complaint on file.      Brief Narrative   71 y.o.femalewith medical history significant for COPD, esophageal stricture, GERD, hiatal hernia, hyperlipidemia, hypertension, IBS, cervical disc disease and history of lung nodules with extensive workup including lung biopsy ruling out Sjogren's disease. Pt has had recurrent history of sinusitis since last fall, treated with several rounds of antibiotics and corticosteroids which have not resulted in significant clinical improvement. She was hospitalized 2 months ago for fever/weight loss and relative hypotension with anorexia and night sweats and was treated for community-acquired pneumonia. She has continued to have sinus drainage with some epistaxis and bloody secretions and also had nausea with dysgeusia for the past few weeks or so. Pt also reported severe muscle pain and leg cramps over the same time period. Of note, pt is followed by Dr. Elsworth Soho for abnormal CT imaging &pulmonary nodules. Work up for this has included an open lung biopsy in 11/2008 in the setting of fever/night sweats and CT scan with pulmonary nodules which showed possible inflammatory pseudotumor vs reaction to old bronchopneumonia associated with some carcinoid tumorlets. Salivary  gland biospy 03/2009 was negative for Sjogren's disease and negative for autoimmune work up. Serial CT's were planned but she was lost to follow  up. She had a repeat CT of the chest in 06/2016 which showed volume loss, post surgical changes of the left hemithorax and RLL pulmonary nodules.   In regards to pt's creatinine, in January of 2018 was 1.7 and in February 2018 it ranged between 1.6 and 2.1 with a peak of 2.8 that prompted referral to nephrology. After evaluation 07/20/16, she was found to have an elevated creatinine level of 12.4 and given the high risk of suspicion for vasculitis pt was directly admitted to hospital for evaluation.  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 She was on solumedrol and then switched to PO prednisone. She is required to have plasmapheresis on alternate days for 2 weeks (started 07/22/2016).   Subjective:   Reports no new complaints. No acute issues overnight reported.   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 Renal started on Cyclophosphamide 100 mg PO QD.  - She has outpt HD established ,  TTS. ( has spot at [facility]) - Nephrology managing no plans to start cytotoxan at this juncture  - repeat anti GBM of 34.  -has outpatient hemodialysis established. renal want to wait a little before perm access .( If no return to renal function in next 4 weeks).  Essential hypertension - Continue Norvasc  -Improved.  Anemia of chronic renal disease -Aranesp when necessary with  dialysis. Significantly  improved hemoglobin today.  Hyperphosphatemia - Low phosphorus diet, monitor with hemodialysis - PTH level 109  Thrombocytopenia Mild. Monitor  Leukocytosis Possibly due to steroid. No signs of infection.  Dyslipidemia - Continue Lipitor   RLL pulmonary nodules - Pulm recommended prophylactic bactrim while on immunosuppresion - Repeat CT imaging done this admission shows unchanged pulmonary nodule. If comparative CT in 2 months unchanged and no evidence of renal recover, doubt that there is benefit of continued therapy. - Monitor for hemoptysis - Follow up with Dr. Elsworth Soho at discharge >had prior PET arranged for May 2018 to review pulmonary nodules.   Constipation Not relieved with  Dulcolax suppository and MiraLAX. Ordered tap water enema.  Code Status : Full code  Family Communication  : None at bedside  Disposition Plan  : Home  once no further plasma exchange needed   Barriers For Discharge : Active symptoms, once cleared by Nephrology for d/c  Consults  :   Nephrology Pulmonary IR    Procedures    RIJ cathter for HD placed 07/20/2016  Renal biopsy 07/21/2016  Plasmapheresis 07/22/16  HD TTS  Cytoxan - started 07/27/16  DVT Prophylaxis  :  SCDs ( no heparin for 1 month post renal bx)  Lab Results  Component Value Date   PLT 47 (L) 08/07/2016    Antibiotics  :    Anti-infectives    Start     Dose/Rate Route Frequency Ordered Stop   07/27/16 0900  sulfamethoxazole-trimethoprim (BACTRIM DS,SEPTRA DS) 800-160 MG per tablet 1 tablet     1 tablet Oral Once per day on Mon Wed Fri 07/26/16 1639     07/20/16 1430  ceFAZolin (ANCEF) IVPB 2g/100 mL premix     2 g 200 mL/hr over 30 Minutes Intravenous To Radiology 07/20/16 1424 07/20/16 1641        Objective:   Vitals:   08/06/16 1030 08/06/16 1157 08/06/16 2131 08/07/16 0504  BP: (!) 158/49 (!) 141/58 135/80 (!) 150/68  Pulse: 65 67 77 68  Resp: '18 18 18 18  ' Temp:  99  F (37.2 C) 98.1  F (36.7 C) 99.2 F (37.3 C)  TempSrc:  Oral Oral Oral  SpO2:  100% 100% 100%  Weight:   56.9 kg (125 lb 7.1 oz)   Height:        Wt Readings from Last 3 Encounters:  08/06/16 56.9 kg (125 lb 7.1 oz)  06/29/16 65.8 kg (145 lb)  06/19/16 64 kg (141 lb)     Intake/Output Summary (Last 24 hours) at 08/07/16 1517 Last data filed at 08/07/16 0600  Gross per 24 hour  Intake              170 ml  Output                0 ml  Net              170 ml    Physical Exam  Gen: alert and awake, in nad. HEENT: moist mucosa,  rt temp HD catheter, atraumatic, normocephalic Chest: clear b/l, no wheezes, equal chest rise. CVS: N S1&S2, no murmurs,  GI: soft, NT, ND, Musculoskeletal: warm, no edema     Data Review:    CBC  Recent Labs Lab 08/03/16 0702 08/04/16 0731 08/05/16 1222 08/06/16 0431 08/07/16 0437  WBC 9.2 13.9* 10.9* 8.0 6.7  HGB 9.9* 9.3* 9.6* 9.0* 9.5*  HCT 28.6* 26.9* 28.5* 26.4* 28.0*  PLT 54* 62* 42* 42* 47*  MCV 87.7 86.5 88.0 87.7 90.3  MCH 30.4 29.9 29.6 29.9 30.6  MCHC 34.6 34.6 33.7 34.1 33.9  RDW 17.6* 18.0* 18.2* 18.7* 20.5*    Chemistries   Recent Labs Lab 08/03/16 0702 08/04/16 0539 08/05/16 0551 08/06/16 0431 08/07/16 0437  NA 134* 134* 136 138 137  K 4.1 3.9 4.1 3.5 3.4*  CL 99* 100* 99* 99* 99*  CO2 '24 25 27 27 28  ' GLUCOSE 110* 132* 94 137* 98  BUN 23* 40* 17 34* 15  CREATININE 3.63* 5.15* 3.53* 5.09* 3.41*  CALCIUM 8.3* 8.4* 8.5* 8.3* 7.9*   ------------------------------------------------------------------------------------------------------------------ No results for input(s): CHOL, HDL, LDLCALC, TRIG, CHOLHDL, LDLDIRECT in the last 72 hours.  Lab Results  Component Value Date   HGBA1C 5.8 02/02/2010   ------------------------------------------------------------------------------------------------------------------ No results for input(s): TSH, T4TOTAL, T3FREE, THYROIDAB in the last 72 hours.  Invalid  input(s): FREET3 ------------------------------------------------------------------------------------------------------------------ No results for input(s): VITAMINB12, FOLATE, FERRITIN, TIBC, IRON, RETICCTPCT in the last 72 hours.  Coagulation profile No results for input(s): INR, PROTIME in the last 168 hours.  No results for input(s): DDIMER in the last 72 hours.  Cardiac Enzymes No results for input(s): CKMB, TROPONINI, MYOGLOBIN in the last 168 hours.  Invalid input(s): CK ------------------------------------------------------------------------------------------------------------------ No results found for: BNP  Inpatient Medications  Scheduled Meds: . sodium chloride   Intravenous Once  . sodium chloride   Intravenous Once  . amLODipine  5 mg Oral Daily  . aspirin EC  81 mg Oral Daily  . atorvastatin  10 mg Oral q1800  . bisacodyl  10 mg Rectal Daily  . darbepoetin (ARANESP) injection - DIALYSIS  150 mcg Intravenous Q Sat-HD  . feeding supplement  1 Container Oral BID BM  . ferric gluconate (FERRLECIT/NULECIT) IV  125 mg Intravenous Q T,Th,Sa-HD  . fluticasone  2 spray Each Nare QHS  . loratadine  10 mg Oral Daily  . pantoprazole  40 mg Oral Daily  . polyethylene glycol  17 g Oral Daily  . predniSONE  40 mg Oral Q breakfast  . sodium chloride  2 spray Each Nare TID  .  sodium chloride flush  3 mL Intravenous Q12H  . sulfamethoxazole-trimethoprim  1 tablet Oral Once per day on Mon Wed Fri   Continuous Infusions:  PRN Meds:.sodium chloride, calcium carbonate, methocarbamol (ROBAXIN)  IV, ondansetron **OR** ondansetron (ZOFRAN) IV, senna, sodium chloride flush, zolpidem  Micro Results No results found for this or any previous visit (from the past 240 hour(s)).  Radiology Reports Ct Chest Wo Contrast  Result Date: 07/29/2016 CLINICAL DATA:  71 year old female inpatient admitted for 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 Longstanding  history of indeterminate pulmonary nodules, described as inflammatory pseudotumors on surgical lung biopsy of the left lung on 11/06/2008. EXAM: CT CHEST WITHOUT CONTRAST TECHNIQUE: Multidetector CT imaging of the chest was performed following the standard protocol without IV contrast. COMPARISON:  06/20/2016 chest CT.  07/26/2016 chest radiograph. FINDINGS: Cardiovascular: Top-normal heart size . No significant pericardial fluid/thickening. Right internal jugular dialysis catheter terminates at the cavoatrial junction. Atherosclerotic nonaneurysmal thoracic aorta. Stable dilated pulmonary arteries (main pulmonary artery diameter 3.4 cm). Mediastinum/Nodes: Stable heterogeneous thyroid gland with possible confluent small hypodense thyroid nodules. 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 No pathologically enlarged axillary, mediastinal or gross hilar lymph nodes, noting limited sensitivity for the detection of hilar adenopathy on this noncontrast study. Lungs/Pleura: No pneumothorax. New trace dependent bilateral pleural effusions. Stable postsurgical changes from wedge resection in the left upper and left lower lobes. Mild centrilobular and paraseptal emphysema with mild diffuse bronchial wall thickening. No acute consolidative airspace disease or new significant pulmonary nodules. Stable parenchymal band in the posterior right upper lobe with associated distortion, compatible with postinfectious/postinflammatory scarring. Stable chronic thickening of the peribronchovascular interstitium throughout both lungs with associated chronic mild distortion. Scattered irregular pulmonary nodules in the right lung are unchanged in the interval and not convincingly changed back to 12/04/2009 chest CT, compatible with benign inflammatory nodules. For example a 1.5 x 1.0 cm peripheral right upper lobe nodule (series 4/ image 41),  measuring 1.5 x 1.0 cm on 06/20/2016 and 1.5 x 0.9 cm on 12/04/2009. Partially cystic irregular right lower lobe 2.2 x 1.7 cm nodule, measuring 2.3 x 1.8 cm on 06/20/2016 and 2.3 x 1.9 cm on 12/04/2009. Upper abdomen: Unremarkable. Musculoskeletal: No aggressive appearing focal osseous lesions. Mild thoracic spondylosis. New mild anasarca. IMPRESSION: 1. New mild anasarca and trace dependent bilateral pleural effusions. Top-normal heart size. 2. Scattered irregular pulmonary nodules in the right lung are stable in the interval and not convincingly changed back to 2011, compatible with benign inflammatory nodules given the results of the 2010 surgical left lung biopsy. 3. Stable postsurgical and postinflammatory scarring in both lungs. No acute pulmonary disease. 4. Mild emphysema with mild diffuse bronchial wall thickening, suggesting COPD. 5. Aortic atherosclerosis. 6. Stable dilated main pulmonary artery, suggesting pulmonary arterial hypertension. Electronically Signed   By: Ilona Sorrel M.D.   On: 07/29/2016 15:48   US Biopsy  Result Date: 07/21/2016 INDICATION: Acute renal failure EXAM: ULTRASOUND-GUIDED BIOPSY OF THE RENAL CORTEX.  RANDOM CORE. MEDICATIONS: None. ANESTHESIA/SEDATION: Fentanyl 50 mcg IV; Versed 1 mg IV Moderate Sedation Time:  10 The patient was continuously monitored during the procedure by the interventional radiology nurse under my direct supervision. FLUOROSCOPY TIME:  None. COMPLICATIONS: None immediate. PROCEDURE: Informed written consent was obtained from the patient after a thorough discussion of the procedural risks, benefits and alternatives. All questions were addressed. Maximal Sterile Barrier Technique was  utilized including caps, mask, sterile gowns, sterile gloves, sterile drape, hand hygiene and skin antiseptic. A timeout was performed prior to the initiation of the procedure. The back was prepped with ChloraPrep in a sterile fashion, and a sterile drape was applied covering  the operative field. A sterile gown and sterile gloves were used for the procedure. Under sonographic guidance, 2 16 gauge core biopsies of the cortex of the lower pole of the left kidney were obtained. Final imaging was performed. Patient tolerated the procedure well without complication. Vital sign monitoring by nursing staff during the procedure will continue as patient is in the special procedures unit for post procedure observation. FINDINGS: The images document guide needle placement within the left lower pole renal cortex. Post biopsy images demonstrate no evidence of hemorrhage. IMPRESSION: Successful ultrasound-guided random renal cortex core biopsy. Electronically Signed   By: Marybelle Killings M.D.   On: 07/21/2016 14:36   Ir Fluoro Guide Cv Line Right  Result Date: 07/20/2016 INDICATION: End-stage renal disease. In need of durable intravenous access for the initiation of dialysis. EXAM: TUNNELED CENTRAL VENOUS HEMODIALYSIS CATHETER PLACEMENT WITH ULTRASOUND AND FLUOROSCOPIC GUIDANCE MEDICATIONS: Ancef 2 gm IV . The antibiotic was given in an appropriate time interval prior to skin puncture. ANESTHESIA/SEDATION: Versed 1 mg IV; Fentanyl 50 mcg IV; Moderate Sedation Time:  17 minutes The patient was continuously monitored during the procedure by the interventional radiology nurse under my direct supervision. FLUOROSCOPY TIME:  Fluoroscopy Time: 12 seconds (2 mGy). COMPLICATIONS: None immediate. PROCEDURE: Informed written consent was obtained from the patient after a discussion of the risks, benefits, and alternatives to treatment. Questions regarding the procedure were encouraged and answered. The right neck and chest were prepped with chlorhexidine in a sterile fashion, and a sterile drape was applied covering the operative field. Maximum barrier sterile technique with sterile gowns and gloves were used for the procedure. A timeout was performed prior to the initiation of the procedure. After creating a  small venotomy incision, a micropuncture kit was utilized to access the right internal jugular vein under direct, real-time ultrasound guidance after the overlying soft tissues were anesthetized with 1% lidocaine with epinephrine. Ultrasound image documentation was performed. The microwire was kinked to measure appropriate catheter length. A stiff Glidewire was advanced to the level of the IVC and the micropuncture sheath was exchanged for a peel-away sheath. A Palindrome tunneled hemodialysis catheter measuring 19 cm from tip to cuff was tunneled in a retrograde fashion from the anterior chest wall to the venotomy incision. The catheter was then placed through the peel-away sheath with tips ultimately positioned within the superior aspect of the right atrium. Final catheter positioning was confirmed and documented with a spot radiographic image. The catheter aspirates and flushes normally. The catheter was flushed with appropriate volume heparin dwells. The catheter exit site was secured with a 0-Prolene retention suture. The venotomy incision was closed with an interrupted 4-0 Vicryl, Dermabond and Steri-strips. Dressings were applied. The patient tolerated the procedure well without immediate post procedural complication. IMPRESSION: Successful placement of 19 cm tip to cuff tunneled hemodialysis catheter via the right internal jugular vein with tips terminating within the superior aspect of the right atrium. The catheter is ready for immediate use. Electronically Signed   By: [name] M.D.   On: 07/20/2016 17:12   Ir US Guide Vasc Access Right  Result Date: 07/20/2016 INDICATION: End-stage renal disease. In need of durable intravenous access for the initiation of dialysis. EXAM: TUNNELED CENTRAL VENOUS  HEMODIALYSIS CATHETER PLACEMENT WITH ULTRASOUND AND FLUOROSCOPIC GUIDANCE MEDICATIONS: Ancef 2 gm IV . The antibiotic was given in an appropriate time interval prior to skin puncture. ANESTHESIA/SEDATION:  Versed 1 mg IV; Fentanyl 50 mcg IV; Moderate Sedation Time:  17 minutes The patient was continuously monitored during the procedure by the interventional radiology nurse under my direct supervision. FLUOROSCOPY TIME:  Fluoroscopy Time: 12 seconds (2 mGy). COMPLICATIONS: None immediate. PROCEDURE: Informed written consent was obtained from the patient after a discussion of the risks, benefits, and alternatives to treatment. Questions regarding the procedure were encouraged and answered. The right neck and chest were prepped with chlorhexidine in a sterile fashion, and a sterile drape was applied covering the operative field. Maximum barrier sterile technique with sterile gowns and gloves were used for the procedure. A timeout was performed prior to the initiation of the procedure. After creating a small venotomy incision, a micropuncture kit was utilized to access the right internal jugular vein under direct, real-time ultrasound guidance after the overlying soft tissues were anesthetized with 1% lidocaine with epinephrine. Ultrasound image documentation was performed. The microwire was kinked to measure appropriate catheter length. A stiff Glidewire was advanced to the level of the IVC and the micropuncture sheath was exchanged for a peel-away sheath. A Palindrome tunneled hemodialysis catheter measuring 19 cm from tip to cuff was tunneled in a retrograde fashion from the anterior chest wall to the venotomy incision. The catheter was then placed through the peel-away sheath with tips ultimately positioned within the superior aspect of the right atrium. Final catheter positioning was confirmed and documented with a spot radiographic image. The catheter aspirates and flushes normally. The catheter was flushed with appropriate volume heparin dwells. The catheter exit site was secured with a 0-Prolene retention suture. The venotomy incision was closed with an interrupted 4-0 Vicryl, Dermabond and Steri-strips. Dressings  were applied. The patient tolerated the procedure well without immediate post procedural complication. IMPRESSION: Successful placement of 19 cm tip to cuff tunneled hemodialysis catheter via the right internal jugular vein with tips terminating within the superior aspect of the right atrium. The catheter is ready for immediate use. Electronically Signed   By: [name] M.D.   On: 07/20/2016 17:12   Dg Chest Port 1 View  Result Date: 07/26/2016 CLINICAL DATA:  Pulmonary nodules EXAM: PORTABLE CHEST 1 VIEW COMPARISON:  06/19/2016, chest CT 06/20/2016 FINDINGS: Right dialysis catheter is in place with the tip at the cavoatrial junction. Postoperative changes on the left. Areas of scarring and nodularity in the left lung are stable. Decreasing airspace opacities in the right lung with some residual areas of nodularity. Heart is borderline in size. No visible effusions. IMPRESSION: Stable scarring and nodularity in the left lung with postoperative changes. Improving areas of nodularity in the right lung. Electronically Signed   By: Rolm Baptise M.D.   On: 07/26/2016 16:30   Ct Maxillofacial Wo Contrast  Result Date: 07/20/2016 CLINICAL DATA:  71 y/o  F; recurrent sinusitis. EXAM: CT MAXILLOFACIAL WITHOUT CONTRAST TECHNIQUE: Multidetector CT imaging of the maxillofacial structures was performed. Multiplanar CT image reconstructions were also generated. A small metallic BB was placed on the right temple in order to reliably differentiate right from left. COMPARISON:  03/13/2016 MRI head.  12/02/2015 CT head. FINDINGS: Frontal sinus: Normally aerated. Patent frontal sinus drainage pathways. Ethmoid sinus: Normally aerated. Maxillary sinuses:  Normally aerated. Sphenoid sinus: Normally aerated. Patent sphenoid ethmoidal recesses. Right ostiomeatal unit:  Patent. Left ostiomeatal unit:  Patent. Nasal  passages: Patent. Intact nasal septum. Mild leftward nasal septal deviation and small spur. Other: Orbits and  intracranial compartment are unremarkable. Mastoid air cells are normally pneumatized. IMPRESSION: Normally aerated paranasal sinuses and patent sinus drainage pathways. Electronically Signed   By: Kristine Garbe M.D.   On: 07/20/2016 14:22    Time Spent in minutes  25   Velvet Bathe M.D on 08/07/2016 at 3:17 PM  Between 7am to 7pm - Pager - [phone]  After 7pm go to [email or web address] - password [facility]  [facility] -  Office  [phone]

## 2016-08-08 DIAGNOSIS — N185 Chronic kidney disease, stage 5: Secondary | ICD-10-CM

## 2016-08-08 LAB — RENAL FUNCTION PANEL
ALBUMIN: 3.8 g/dL (ref 3.5–5.0)
ANION GAP: 9 (ref 5–15)
BUN: 30 mg/dL — ABNORMAL HIGH (ref 6–20)
CO2: 28 mmol/L (ref 22–32)
Calcium: 8.5 mg/dL — ABNORMAL LOW (ref 8.9–10.3)
Chloride: 101 mmol/L (ref 101–111)
Creatinine, Ser: 5.32 mg/dL — ABNORMAL HIGH (ref 0.44–1.00)
GFR calc Af Amer: 8 mL/min — ABNORMAL LOW (ref >60)
GFR calc non Af Amer: 7 mL/min — ABNORMAL LOW (ref >60)
GLUCOSE: 108 mg/dL — AB (ref 65–99)
POTASSIUM: 3.7 mmol/L (ref 3.5–5.1)
Phosphorus: 3.4 mg/dL (ref 2.5–4.6)
Sodium: 138 mmol/L (ref 135–145)

## 2016-08-08 LAB — CBC
HCT: 31.7 % — ABNORMAL LOW (ref 36.0–46.0)
HEMOGLOBIN: 10.4 g/dL — AB (ref 12.0–15.0)
MCH: 30.1 pg (ref 26.0–34.0)
MCHC: 32.8 g/dL (ref 30.0–36.0)
MCV: 91.9 fL (ref 78.0–100.0)
Platelets: 53 10*3/uL — ABNORMAL LOW (ref 150–400)
RBC: 3.45 MIL/uL — AB (ref 3.87–5.11)
RDW: 21.6 % — ABNORMAL HIGH (ref 11.5–15.5)
WBC: 5.4 10*3/uL (ref 4.0–10.5)

## 2016-08-08 MED ORDER — HYDROCORTISONE 2.5 % RE CREA
TOPICAL_CREAM | Freq: Three times a day (TID) | RECTAL | Status: DC
  Administered 2016-08-08 – 2016-08-10 (×4): via RECTAL
  Filled 2016-08-08: qty 28.35

## 2016-08-08 NOTE — Consult Note (Signed)
[facility] [place]  Patient name: [name] MRN: [id number] DOB: [birth year] Sex: female  REASON FOR CONSULT: Hemodialysis access  HPI: [name] is a 71 y.o. female, who is seen to discuss placement for hemodialysis access. She had a right IJ catheter placed by interventional radiology and has had no difficulty with this so far. She is right-handed. She has severe some threat thrombocytopenia felt to be related to medication and this is rebounding slightly. She has never had prior arm access.  Past Medical History:  Diagnosis Date  . Acute kidney injury ([place]) 07/20/2016  . Allergic rhinitis   . Anxiety   . CAP (community acquired pneumonia) 06/2016   [name] 06/19/2016  . Cervical disc disease    Archie Endo 07/20/2016  . Chest pain at rest 06/18/2012  . COPD (chronic obstructive pulmonary disease) ([place]) 05/23/2012  . Diverticulosis   . DJD (degenerative joint disease)   . Esophageal diverticulum   . Esophageal stricture   . GERD (gastroesophageal reflux disease)   . Hiatal hernia   . History of blood transfusion 06/2016   "when I was hospitalized w/sepsis"  . Hyperlipidemia 05/23/2012   T. Chol 234, LDL 130   . Hypertension   . IBS (irritable bowel syndrome)   . Lung nodules    hx  with extensive workup including lung biopsy ruling out Sjogren's disease/notes 07/20/2016  . Osteopenia   . Recurrent sinusitis    [name] 07/20/2016  . Sinus headache    "daily lately" (07/20/2016)  . Traumatic arthritis     Family History  Problem Relation Age of Onset  . Throat cancer Father   . Hypertension Sister   . Thyroid disease Daughter   . Hypertension Sister     x 2  . Breast cancer Maternal Aunt   . Blindness Sister     legally blind  . Colon cancer Neg Hx   . Stomach cancer Neg Hx     SOCIAL HISTORY: Social History   Social History  . Marital status: Divorced    Spouse name: N/A  . Number of children:  2  . Years of education: 12   Occupational History  . Retired Chartered certified accountant    Social History Main Topics  . Smoking status: Former Smoker    Packs/day: 0.12    Years: 8.00    Types: Cigarettes    Quit date: 05/04/1985  . Smokeless tobacco: Never Used  . Alcohol use 0.6 oz/week    1 Cans of beer per week  . Drug use: No  . Sexual activity: Not Currently   Other Topics Concern  . Not on file   Social History Narrative   Lives alone   Caffeine -tea, 1 cup daily;  Pepsi maybe one a day    Allergies  Allergen Reactions  . Ace Inhibitors Anaphylaxis    REACTION: angioedema  . Other Anaphylaxis    Calcium Channel Blocking Agent Diltiazem Analogues  . Pneumococcal Vaccines Other (See Comments)    Red rash, Knot (sore and itches), A fever for a couple days (per pt report)  . Tetanus Toxoid Other (See Comments)    Cellulitis in arm    Current Facility-Administered Medications  Medication Dose Route Frequency Provider Last Rate Last Dose  . 0.9 %  sodium chloride infusion  250 mL Intravenous PRN Waldemar Dickens, MD      . 0.9 %  sodium chloride infusion   Intravenous Once Corliss Parish, MD  Stopped at 07/26/16 1154  . 0.9 %  sodium chloride infusion   Intravenous Once Corliss Parish, MD      . amLODipine (NORVASC) tablet 5 mg  5 mg Oral Daily Waldemar Dickens, MD   5 mg at 08/08/16 1008  . aspirin EC tablet 81 mg  81 mg Oral Daily Waldemar Dickens, MD   81 mg at 08/08/16 1008  . atorvastatin (LIPITOR) tablet 10 mg  10 mg Oral q1800 Waldemar Dickens, MD   10 mg at 08/07/16 1717  . bisacodyl (DULCOLAX) suppository 10 mg  10 mg Rectal Daily Nishant Dhungel, MD   10 mg at 08/06/16 1139  . calcium carbonate (TUMS - dosed in mg elemental calcium) chewable tablet 200 mg of elemental calcium  1 tablet Oral BID PRN Corliss Parish, MD   200 mg of elemental calcium at 07/26/16 0934  . Darbepoetin Alfa (ARANESP) injection 150 mcg  150 mcg Intravenous Q Sat-HD Corliss Parish,  MD   150 mcg at 08/06/16 1016  . feeding supplement (BOOST / RESOURCE BREEZE) liquid 1 Container  1 Container Oral BID BM Theodis Blaze, MD   1 Container at 08/07/16 1107  . ferric gluconate (NULECIT) 125 mg in sodium chloride 0.9 % 100 mL IVPB  125 mg Intravenous Q T,Th,Sa-HD Jamal Maes, MD   125 mg at 08/06/16 1015  . fluticasone (FLONASE) 50 MCG/ACT nasal spray 2 spray  2 spray Each Nare QHS Waldemar Dickens, MD   2 spray at 07/30/16 2241  . hydrocortisone (ANUSOL-HC) 2.5 % rectal cream   Rectal TID Velvet Bathe, MD      . loratadine (CLARITIN) tablet 10 mg  10 mg Oral Daily Waldemar Dickens, MD   10 mg at 08/08/16 1008  . methocarbamol (ROBAXIN) 500 mg in dextrose 5 % 50 mL IVPB  500 mg Intravenous Q8H PRN Robbie Lis, MD   500 mg at 07/22/16 5035  . ondansetron (ZOFRAN) tablet 4 mg  4 mg Oral Q6H PRN Waldemar Dickens, MD   4 mg at 07/30/16 4656   Or  . ondansetron [facility]) injection 4 mg  4 mg Intravenous Q6H PRN Waldemar Dickens, MD   4 mg at 08/04/16 1815  . pantoprazole (PROTONIX) EC tablet 40 mg  40 mg Oral Daily Waldemar Dickens, MD   40 mg at 08/08/16 1008  . polyethylene glycol (MIRALAX / GLYCOLAX) packet 17 g  17 g Oral Daily Nishant Dhungel, MD   17 g at 08/08/16 1014  . predniSONE (DELTASONE) tablet 20 mg  20 mg Oral Q breakfast Jamal Maes, MD   20 mg at 08/08/16 1007  . senna (SENOKOT) tablet 8.6 mg  1 tablet Oral BID PRN Robbie Lis, MD   8.6 mg at 07/28/16 1352  . sodium chloride (OCEAN) 0.65 % nasal spray 2 spray  2 spray Each Nare TID Waldemar Dickens, MD   2 spray at 08/08/16 1017  . sodium chloride flush (NS) 0.9 % injection 3 mL  3 mL Intravenous Q12H Waldemar Dickens, MD   3 mL at 08/08/16 1018  . sodium chloride flush (NS) 0.9 % injection 3 mL  3 mL Intravenous PRN Waldemar Dickens, MD   3 mL at 07/30/16 1029  . sulfamethoxazole-trimethoprim (BACTRIM DS,SEPTRA DS) 800-160 MG per tablet 1 tablet  1 tablet Oral Once per day on Mon Wed Fri Donita Brooks, NP   1 tablet at  08/08/16 1007  .  zolpidem (AMBIEN) tablet 5 mg  5 mg Oral QHS PRN Vianne Bulls, MD   5 mg at 08/06/16 2127    REVIEW OF SYSTEMS:  Reviewed in her history and physical with nothing to add  PHYSICAL EXAM: Vitals:   08/07/16 1807 08/07/16 2130 08/08/16 0618 08/08/16 1213  BP: (!) 139/57 122/87 (!) 155/68 (!) 156/86  Pulse:  86 80 84  Resp: 18 18 18 18   Temp: 98 F (36.7 C) 97.9 F (36.6 C) 98.6 F (37 C)   TempSrc: Oral Oral Oral Oral  SpO2: 100% 100% 100% 99%  Weight:  123 lb 11.2 oz (56.1 kg)    Height:        GENERAL: The patient is a well-nourished female, in no acute distress. The vital signs are documented above. CARDIOVASCULAR: 2+ radial pulses bilaterally small surface veins PULMONARY: There is good air exchange  ABDOMEN: Soft and non-tender  MUSCULOSKELETAL: There are no major deformities or cyanosis. NEUROLOGIC: No focal weakness or paresthesias are detected. SKIN: There are no ulcers or rashes noted. PSYCHIATRIC: The patient has a normal affect.  DATA:  Vein mapping pending  MEDICAL ISSUES: End-stage renal disease needing chronic hemodialysis access. She is right-handed. She does have a IV in her left wrist. We will await vein mapping to make a recommendation for appropriate site placement. Will need to have rebound in her platelets prior to access treatment.   Rosetta Posner, MD FACS [facility] Tel [phone] Pager [phone]

## 2016-08-08 NOTE — Progress Notes (Signed)
PROGRESS NOTE                                                                                                                                                                                                             Patient Demographics:    [name], is a 71 y.o. female, DOB - [birth year], PYK:[id number]  Admit date - 07/20/2016   Admitting Physician Waldemar Dickens, MD  Outpatient Primary MD for the patient is [facility], MD  LOS - 19  Outpatient Specialists: NONE  No chief complaint on file.     Brief Narrative   71 y.o.femalewith medical history significant for COPD, esophageal stricture, GERD, hiatal hernia, hyperlipidemia, hypertension, IBS, cervical disc disease and history of lung nodules with extensive workup including lung biopsy ruling out Sjogren's disease. Pt has had recurrent history of sinusitis since last fall, treated with several rounds of antibiotics and corticosteroids which have not resulted in significant clinical improvement. She was hospitalized 2 months ago for fever/weight loss and relative hypotension with anorexia and night sweats and was treated for community-acquired pneumonia. She has continued to have sinus drainage with some epistaxis and bloody secretions and also had nausea with dysgeusia for the past few weeks or so. Pt also reported severe muscle pain and leg cramps over the same time period. Of note, pt is followed by Dr. Elsworth Soho for abnormal CT imaging &pulmonary nodules. Work up for this has included an open lung biopsy in 11/2008 in the setting of fever/night sweats and CT scan with pulmonary nodules which showed possible inflammatory pseudotumor vs reaction to old bronchopneumonia associated with some carcinoid tumorlets. Salivary  gland biospy 03/2009 was negative for Sjogren's disease and negative for autoimmune work up. Serial CT's were planned but she was lost to follow up.  She had a repeat CT of the chest in 06/2016 which showed volume loss, post surgical changes of the left hemithorax and RLL pulmonary nodules.   In regards to pt's creatinine, in January of 2018 was 1.7 and in February 2018 it ranged between 1.6 and 2.1 with a peak of 2.8 that prompted referral to nephrology. After evaluation 07/20/16, she was found to have an elevated creatinine level of 12.4 and given the high risk of suspicion for vasculitis pt was directly admitted to hospital for evaluation.  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 She was on solumedrol and then switched to PO prednisone. She is required to have plasmapheresis on alternate days for 2 weeks (started 07/22/2016).   Subjective:   Reports no new complaints reported to me.   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 Renal started on Cyclophosphamide 100 mg PO QD.  - She has outpt HD established ,  TTS. ( has spot at [facility]) - Nephrology managing no plans to start cytotoxan at this juncture  - repeat anti GBM of 34.  -has outpatient hemodialysis established. Plan is to d/c after access.  Essential hypertension - Continue Norvasc  -Improved.  Anemia of chronic renal disease -Aranesp when necessary with dialysis. Significantly  improved hemoglobin today.  Hyperphosphatemia - Low phosphorus diet, monitor with  hemodialysis - PTH level 109  Thrombocytopenia Mild. Monitor  Leukocytosis Possibly due to steroid. No signs of infection.  Dyslipidemia - Continue Lipitor   RLL pulmonary nodules - Pulm recommended prophylactic bactrim while on immunosuppresion - Repeat CT imaging done this admission shows unchanged pulmonary nodule. If comparative CT in 2 months unchanged and no evidence of renal recover, doubt that there is benefit of continued therapy. - Monitor for hemoptysis - Follow up with Dr. Elsworth Soho at discharge >had prior PET arranged for May 2018 to review pulmonary nodules.   Constipation Not relieved with  Dulcolax suppository and MiraLAX. Ordered tap water enema.  Code Status : Full code  Family Communication  : None at bedside  Disposition Plan  : Home  once no further plasma exchange needed   Barriers For Discharge : D/c After access  Consults  :   Nephrology Pulmonary IR    Procedures    RIJ cathter for HD placed 07/20/2016  Renal biopsy 07/21/2016  Plasmapheresis 07/22/16  HD TTS  Cytoxan - started 07/27/16  DVT Prophylaxis  :  SCDs ( no heparin for 1 month post renal bx)  Lab Results  Component Value Date   PLT 53 (L) 08/08/2016    Antibiotics  :    Anti-infectives    Start     Dose/Rate Route Frequency Ordered Stop   07/27/16 0900  sulfamethoxazole-trimethoprim (BACTRIM DS,SEPTRA DS) 800-160 MG per tablet 1 tablet     1 tablet Oral Once per day on Mon Wed Fri 07/26/16 1639     07/20/16 1430  ceFAZolin (ANCEF) IVPB 2g/100 mL premix     2 g 200 mL/hr over 30 Minutes Intravenous To Radiology 07/20/16 1424 07/20/16 1641        Objective:   Vitals:   08/07/16 1807 08/07/16 2130 08/08/16 0618 08/08/16 1213  BP: (!) 139/57 122/87 (!) 155/68 (!) 156/86  Pulse:  86 80 84  Resp: '18 18 18 18  ' Temp: 98 F (36.7 C) 97.9 F (36.6 C) 98.6 F (37 C)   TempSrc: Oral Oral Oral Oral  SpO2: 100% 100% 100% 99%  Weight:  56.1  kg (123 lb 11.2 oz)      Height:        Wt Readings from Last 3 Encounters:  08/07/16 56.1 kg (123 lb 11.2 oz)  06/29/16 65.8 kg (145 lb)  06/19/16 64 kg (141 lb)     Intake/Output Summary (Last 24 hours) at 08/08/16 1739 Last data filed at 08/08/16 0618  Gross per 24 hour  Intake                0 ml  Output                0 ml  Net                0 ml    Physical Exam  Gen: alert and awake, in nad. HEENT: moist mucosa,  rt temp HD catheter, atraumatic, normocephalic Chest: clear b/l, no wheezes, equal chest rise. CVS: N S1&S2, no murmurs,  GI: soft, NT, ND, Musculoskeletal: warm, no edema     Data Review:    CBC  Recent Labs Lab 08/04/16 0731 08/05/16 1222 08/06/16 0431 08/07/16 0437 08/08/16 0648  WBC 13.9* 10.9* 8.0 6.7 5.4  HGB 9.3* 9.6* 9.0* 9.5* 10.4*  HCT 26.9* 28.5* 26.4* 28.0* 31.7*  PLT 62* 42* 42* 47* 53*  MCV 86.5 88.0 87.7 90.3 91.9  MCH 29.9 29.6 29.9 30.6 30.1  MCHC 34.6 33.7 34.1 33.9 32.8  RDW 18.0* 18.2* 18.7* 20.5* 21.6*    Chemistries   Recent Labs Lab 08/04/16 0539 08/05/16 0551 08/06/16 0431 08/07/16 0437 08/08/16 0648  NA 134* 136 138 137 138  K 3.9 4.1 3.5 3.4* 3.7  CL 100* 99* 99* 99* 101  CO2 '25 27 27 28 28  ' GLUCOSE 132* 94 137* 98 108*  BUN 40* 17 34* 15 30*  CREATININE 5.15* 3.53* 5.09* 3.41* 5.32*  CALCIUM 8.4* 8.5* 8.3* 7.9* 8.5*   ------------------------------------------------------------------------------------------------------------------ No results for input(s): CHOL, HDL, LDLCALC, TRIG, CHOLHDL, LDLDIRECT in the last 72 hours.  Lab Results  Component Value Date   HGBA1C 5.8 02/02/2010   ------------------------------------------------------------------------------------------------------------------ No results for input(s): TSH, T4TOTAL, T3FREE, THYROIDAB in the last 72 hours.  Invalid input(s): FREET3 ------------------------------------------------------------------------------------------------------------------ No  results for input(s): VITAMINB12, FOLATE, FERRITIN, TIBC, IRON, RETICCTPCT in the last 72 hours.  Coagulation profile No results for input(s): INR, PROTIME in the last 168 hours.  No results for input(s): DDIMER in the last 72 hours.  Cardiac Enzymes No results for input(s): CKMB, TROPONINI, MYOGLOBIN in the last 168 hours.  Invalid input(s): CK ------------------------------------------------------------------------------------------------------------------ No results found for: BNP  Inpatient Medications  Scheduled Meds: . sodium chloride   Intravenous Once  . sodium chloride   Intravenous Once  . amLODipine  5 mg Oral Daily  . aspirin EC  81 mg Oral Daily  . atorvastatin  10 mg Oral q1800  . bisacodyl  10 mg Rectal Daily  . darbepoetin (ARANESP) injection - DIALYSIS  150 mcg Intravenous Q Sat-HD  . feeding supplement  1 Container Oral BID BM  . ferric gluconate (FERRLECIT/NULECIT) IV  125 mg Intravenous Q T,Th,Sa-HD  . fluticasone  2 spray Each Nare QHS  . hydrocortisone   Rectal TID  . loratadine  10 mg Oral Daily  . pantoprazole  40 mg Oral Daily  . polyethylene glycol  17 g Oral Daily  . predniSONE  20 mg Oral Q breakfast  . sodium chloride  2 spray Each Nare TID  . sodium chloride flush  3 mL Intravenous Q12H  .  sulfamethoxazole-trimethoprim  1 tablet Oral Once per day on Mon Wed Fri   Continuous Infusions:  PRN Meds:.sodium chloride, calcium carbonate, methocarbamol (ROBAXIN)  IV, ondansetron **OR** ondansetron (ZOFRAN) IV, senna, sodium chloride flush, zolpidem  Micro Results No results found for this or any previous visit (from the past 240 hour(s)).  Radiology Reports Ct Chest Wo Contrast  Result Date: 07/29/2016 CLINICAL DATA:  71 year old female inpatient admitted for 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 Longstanding history of indeterminate pulmonary nodules, described as inflammatory pseudotumors on surgical lung biopsy of  the left lung on 11/06/2008. EXAM: CT CHEST WITHOUT CONTRAST TECHNIQUE: Multidetector CT imaging of the chest was performed following the standard protocol without IV contrast. COMPARISON:  06/20/2016 chest CT.  07/26/2016 chest radiograph. FINDINGS: Cardiovascular: Top-normal heart size . No significant pericardial fluid/thickening. Right internal jugular dialysis catheter terminates at the cavoatrial junction. Atherosclerotic nonaneurysmal thoracic aorta. Stable dilated pulmonary arteries (main pulmonary artery diameter 3.4 cm). Mediastinum/Nodes: Stable heterogeneous thyroid gland with possible confluent small hypodense thyroid nodules. 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 No pathologically enlarged axillary, mediastinal or gross hilar lymph nodes, noting limited sensitivity for the detection of hilar adenopathy on this noncontrast study. Lungs/Pleura: No pneumothorax. New trace dependent bilateral pleural effusions. Stable postsurgical changes from wedge resection in the left upper and left lower lobes. Mild centrilobular and paraseptal emphysema with mild diffuse bronchial wall thickening. No acute consolidative airspace disease or new significant pulmonary nodules. Stable parenchymal band in the posterior right upper lobe with associated distortion, compatible with postinfectious/postinflammatory scarring. Stable chronic thickening of the peribronchovascular interstitium throughout both lungs with associated chronic mild distortion. Scattered irregular pulmonary nodules in the right lung are unchanged in the interval and not convincingly changed back to 12/04/2009 chest CT, compatible with benign inflammatory nodules. For example a 1.5 x 1.0 cm peripheral right upper lobe nodule (series 4/ image 41), measuring 1.5 x 1.0 cm on 06/20/2016 and 1.5 x 0.9 cm on 12/04/2009. Partially cystic irregular right lower  lobe 2.2 x 1.7 cm nodule, measuring 2.3 x 1.8 cm on 06/20/2016 and 2.3 x 1.9 cm on 12/04/2009. Upper abdomen: Unremarkable. Musculoskeletal: No aggressive appearing focal osseous lesions. Mild thoracic spondylosis. New mild anasarca. IMPRESSION: 1. New mild anasarca and trace dependent bilateral pleural effusions. Top-normal heart size. 2. Scattered irregular pulmonary nodules in the right lung are stable in the interval and not convincingly changed back to 2011, compatible with benign inflammatory nodules given the results of the 2010 surgical left lung biopsy. 3. Stable postsurgical and postinflammatory scarring in both lungs. No acute pulmonary disease. 4. Mild emphysema with mild diffuse bronchial wall thickening, suggesting COPD. 5. Aortic atherosclerosis. 6. Stable dilated main pulmonary artery, suggesting pulmonary arterial hypertension. Electronically Signed   By: Ilona Sorrel M.D.   On: 07/29/2016 15:48   US Biopsy  Result Date: 07/21/2016 INDICATION: Acute renal failure EXAM: ULTRASOUND-GUIDED BIOPSY OF THE RENAL CORTEX.  RANDOM CORE. MEDICATIONS: None. ANESTHESIA/SEDATION: Fentanyl 50 mcg IV; Versed 1 mg IV Moderate Sedation Time:  10 The patient was continuously monitored during the procedure by the interventional radiology nurse under my direct supervision. FLUOROSCOPY TIME:  None. COMPLICATIONS: None immediate. PROCEDURE: Informed written consent was obtained from the patient after a thorough discussion of the procedural risks, benefits and alternatives. All questions were addressed. Maximal Sterile Barrier Technique was utilized including caps, mask, sterile gowns, sterile gloves, sterile drape,  hand hygiene and skin antiseptic. A timeout was performed prior to the initiation of the procedure. The back was prepped with ChloraPrep in a sterile fashion, and a sterile drape was applied covering the operative field. A sterile gown and sterile gloves were used for the procedure. Under sonographic  guidance, 2 16 gauge core biopsies of the cortex of the lower pole of the left kidney were obtained. Final imaging was performed. Patient tolerated the procedure well without complication. Vital sign monitoring by nursing staff during the procedure will continue as patient is in the special procedures unit for post procedure observation. FINDINGS: The images document guide needle placement within the left lower pole renal cortex. Post biopsy images demonstrate no evidence of hemorrhage. IMPRESSION: Successful ultrasound-guided random renal cortex core biopsy. Electronically Signed   By: Marybelle Killings M.D.   On: 07/21/2016 14:36   Ir Fluoro Guide Cv Line Right  Result Date: 07/20/2016 INDICATION: End-stage renal disease. In need of durable intravenous access for the initiation of dialysis. EXAM: TUNNELED CENTRAL VENOUS HEMODIALYSIS CATHETER PLACEMENT WITH ULTRASOUND AND FLUOROSCOPIC GUIDANCE MEDICATIONS: Ancef 2 gm IV . The antibiotic was given in an appropriate time interval prior to skin puncture. ANESTHESIA/SEDATION: Versed 1 mg IV; Fentanyl 50 mcg IV; Moderate Sedation Time:  17 minutes The patient was continuously monitored during the procedure by the interventional radiology nurse under my direct supervision. FLUOROSCOPY TIME:  Fluoroscopy Time: 12 seconds (2 mGy). COMPLICATIONS: None immediate. PROCEDURE: Informed written consent was obtained from the patient after a discussion of the risks, benefits, and alternatives to treatment. Questions regarding the procedure were encouraged and answered. The right neck and chest were prepped with chlorhexidine in a sterile fashion, and a sterile drape was applied covering the operative field. Maximum barrier sterile technique with sterile gowns and gloves were used for the procedure. A timeout was performed prior to the initiation of the procedure. After creating a small venotomy incision, a micropuncture kit was utilized to access the right internal jugular vein  under direct, real-time ultrasound guidance after the overlying soft tissues were anesthetized with 1% lidocaine with epinephrine. Ultrasound image documentation was performed. The microwire was kinked to measure appropriate catheter length. A stiff Glidewire was advanced to the level of the IVC and the micropuncture sheath was exchanged for a peel-away sheath. A Palindrome tunneled hemodialysis catheter measuring 19 cm from tip to cuff was tunneled in a retrograde fashion from the anterior chest wall to the venotomy incision. The catheter was then placed through the peel-away sheath with tips ultimately positioned within the superior aspect of the right atrium. Final catheter positioning was confirmed and documented with a spot radiographic image. The catheter aspirates and flushes normally. The catheter was flushed with appropriate volume heparin dwells. The catheter exit site was secured with a 0-Prolene retention suture. The venotomy incision was closed with an interrupted 4-0 Vicryl, Dermabond and Steri-strips. Dressings were applied. The patient tolerated the procedure well without immediate post procedural complication. IMPRESSION: Successful placement of 19 cm tip to cuff tunneled hemodialysis catheter via the right internal jugular vein with tips terminating within the superior aspect of the right atrium. The catheter is ready for immediate use. Electronically Signed   By: [name] M.D.   On: 07/20/2016 17:12   Ir US Guide Vasc Access Right  Result Date: 07/20/2016 INDICATION: End-stage renal disease. In need of durable intravenous access for the initiation of dialysis. EXAM: TUNNELED CENTRAL VENOUS HEMODIALYSIS CATHETER PLACEMENT WITH ULTRASOUND AND FLUOROSCOPIC GUIDANCE MEDICATIONS: Ancef  2 gm IV . The antibiotic was given in an appropriate time interval prior to skin puncture. ANESTHESIA/SEDATION: Versed 1 mg IV; Fentanyl 50 mcg IV; Moderate Sedation Time:  17 minutes The patient was continuously  monitored during the procedure by the interventional radiology nurse under my direct supervision. FLUOROSCOPY TIME:  Fluoroscopy Time: 12 seconds (2 mGy). COMPLICATIONS: None immediate. PROCEDURE: Informed written consent was obtained from the patient after a discussion of the risks, benefits, and alternatives to treatment. Questions regarding the procedure were encouraged and answered. The right neck and chest were prepped with chlorhexidine in a sterile fashion, and a sterile drape was applied covering the operative field. Maximum barrier sterile technique with sterile gowns and gloves were used for the procedure. A timeout was performed prior to the initiation of the procedure. After creating a small venotomy incision, a micropuncture kit was utilized to access the right internal jugular vein under direct, real-time ultrasound guidance after the overlying soft tissues were anesthetized with 1% lidocaine with epinephrine. Ultrasound image documentation was performed. The microwire was kinked to measure appropriate catheter length. A stiff Glidewire was advanced to the level of the IVC and the micropuncture sheath was exchanged for a peel-away sheath. A Palindrome tunneled hemodialysis catheter measuring 19 cm from tip to cuff was tunneled in a retrograde fashion from the anterior chest wall to the venotomy incision. The catheter was then placed through the peel-away sheath with tips ultimately positioned within the superior aspect of the right atrium. Final catheter positioning was confirmed and documented with a spot radiographic image. The catheter aspirates and flushes normally. The catheter was flushed with appropriate volume heparin dwells. The catheter exit site was secured with a 0-Prolene retention suture. The venotomy incision was closed with an interrupted 4-0 Vicryl, Dermabond and Steri-strips. Dressings were applied. The patient tolerated the procedure well without immediate post procedural  complication. IMPRESSION: Successful placement of 19 cm tip to cuff tunneled hemodialysis catheter via the right internal jugular vein with tips terminating within the superior aspect of the right atrium. The catheter is ready for immediate use. Electronically Signed   By: [name] M.D.   On: 07/20/2016 17:12   Dg Chest Port 1 View  Result Date: 07/26/2016 CLINICAL DATA:  Pulmonary nodules EXAM: PORTABLE CHEST 1 VIEW COMPARISON:  06/19/2016, chest CT 06/20/2016 FINDINGS: Right dialysis catheter is in place with the tip at the cavoatrial junction. Postoperative changes on the left. Areas of scarring and nodularity in the left lung are stable. Decreasing airspace opacities in the right lung with some residual areas of nodularity. Heart is borderline in size. No visible effusions. IMPRESSION: Stable scarring and nodularity in the left lung with postoperative changes. Improving areas of nodularity in the right lung. Electronically Signed   By: Rolm Baptise M.D.   On: 07/26/2016 16:30   Ct Maxillofacial Wo Contrast  Result Date: 07/20/2016 CLINICAL DATA:  71 y/o  F; recurrent sinusitis. EXAM: CT MAXILLOFACIAL WITHOUT CONTRAST TECHNIQUE: Multidetector CT imaging of the maxillofacial structures was performed. Multiplanar CT image reconstructions were also generated. A small metallic BB was placed on the right temple in order to reliably differentiate right from left. COMPARISON:  03/13/2016 MRI head.  12/02/2015 CT head. FINDINGS: Frontal sinus: Normally aerated. Patent frontal sinus drainage pathways. Ethmoid sinus: Normally aerated. Maxillary sinuses:  Normally aerated. Sphenoid sinus: Normally aerated. Patent sphenoid ethmoidal recesses. Right ostiomeatal unit:  Patent. Left ostiomeatal unit:  Patent. Nasal passages: Patent. Intact nasal septum. Mild leftward nasal septal deviation  and small spur. Other: Orbits and intracranial compartment are unremarkable. Mastoid air cells are normally pneumatized.  IMPRESSION: Normally aerated paranasal sinuses and patent sinus drainage pathways. Electronically Signed   By: Kristine Garbe M.D.   On: 07/20/2016 14:22    Time Spent in minutes  25   Velvet Bathe M.D on 08/08/2016 at 5:39 PM  Between 7am to 7pm - Pager - [phone]  After 7pm go to [email or web address] - password [email or web address]  [facility] -  Office  [phone]

## 2016-08-08 NOTE — Care Management Important Message (Signed)
Important Message  Patient Details  Name: [name] MRN: [id number] Date of Birth: 04/19/1946   Medicare Important Message Given:  Yes    Orbie Pyo 08/08/2016, 4:56 PM

## 2016-08-08 NOTE — Progress Notes (Signed)
Subjective: Interval History: has complaints tired of hosp.  Objective: Vital signs in last 24 hours: Temp:  [97.9 F (36.6 C)-98.6 F (37 C)] 98.6 F (37 C) (04/02 0618) Pulse Rate:  [80-86] 80 (04/02 0618) Resp:  [18] 18 (04/02 0618) BP: (122-155)/(57-87) 155/68 (04/02 0618) SpO2:  [100 %] 100 % (04/02 0618) Weight:  [56.1 kg (123 lb 11.2 oz)] 56.1 kg (123 lb 11.2 oz) (04/01 2130) Weight change: -0.79 kg (-1 lb 11.9 oz)  Intake/Output from previous day: 04/01 0701 - 04/02 0700 In: 240 [P.O.:240] Out: 0  Intake/Output this shift: No intake/output data recorded.  General appearance: alert, cooperative and no distress Resp: diminished breath sounds bilaterally Chest wall: IJ PC Cardio: S1, S2 normal and systolic murmur: systolic ejection 2/6, decrescendo at 2nd left intercostal space GI: soft, non-tender; bowel sounds normal; no masses,  no organomegaly Extremities: extremities normal, atraumatic, no cyanosis or edema  Lab Results:  Recent Labs  08/07/16 0437 08/08/16 0648  WBC 6.7 5.4  HGB 9.5* 10.4*  HCT 28.0* 31.7*  PLT 47* 53*   BMET:  Recent Labs  08/07/16 0437 08/08/16 0648  NA 137 138  K 3.4* 3.7  CL 99* 101  CO2 28 28  GLUCOSE 98 108*  BUN 15 30*  CREATININE 3.41* 5.32*  CALCIUM 7.9* 8.5*   No results for input(s): PTH in the last 72 hours. Iron Studies: No results for input(s): IRON, TIBC, TRANSFERRIN, FERRITIN in the last 72 hours.  Studies/Results: No results found.  I have reviewed the patient's current medications.  Assessment/Plan: 1 ESRD ptlts rising get access later this week. HD tomorrow 2 Anemia esa/Fe 3 HPTH 4 AntiGBM CTX off, on Pred, had TPE 5 ANCA P HD, access, D/c after access    LOS: 19 days   Hali Balgobin L 08/08/2016,12:06 PM

## 2016-08-08 NOTE — Progress Notes (Signed)
qPhysical Therapy Treatment Patient Details Name: [name] MRN: [id number] DOB: February 20, 1946 Today's Date: 08/08/2016    History of Present Illness [name] is a 71 y.o. female with medical history significant of COPD, esophageal stricture, GERD, hiatal hernia, hyperlipidemia, hypertension, IBS presenting with acute renal failure necessitating aggressive workup and treatment.    [name] Comments    [name] at a modified to independent level for all basic mobility and gait.  With need for help on stairs, which [name] does not have at home and avoids in the community.  [name] has met all goals appropriately and will sign off at this time with HHPT home safety eval on discharge.    Follow Up Recommendations  Home health [name];Supervision - Intermittent (home safety eval)     Equipment Recommendations       Recommendations for Other Services       Precautions / Restrictions Precautions Precautions: None    Mobility  Bed Mobility               General bed mobility comments: up in the chair  Transfers Overall transfer level: Modified independent                  Ambulation/Gait Ambulation/Gait assistance: Independent;Modified independent (Device/Increase time) Ambulation Distance (Feet): 500 Feet Assistive device: None Gait Pattern/deviations: Step-through pattern Gait velocity: significant increase in gait speed. Gait velocity interpretation: at or above normal speed for age/gender General Gait Details: steady with min to moderate challenge.   Stairs Stairs: Yes   Stair Management: One rail Right;Step to pattern;Forwards Number of Stairs: 1 (unable to attain 2nd step)    Wheelchair Mobility    Modified Rankin (Stroke Patients Only)       Balance Overall balance assessment: Needs assistance   Sitting balance-Leahy Scale: Good       Standing balance-Leahy Scale: Good                   Standardized Balance Assessment Standardized Balance  Assessment : Dynamic Gait Index   Dynamic Gait Index Level Surface: Normal Change in Gait Speed: Normal Gait with Horizontal Head Turns: Normal Gait with Vertical Head Turns: Normal Gait and Pivot Turn: Normal Step Over Obstacle: Mild Impairment Step Around Obstacles: Mild Impairment Steps: Moderate Impairment Total Score: 20      Cognition Arousal/Alertness: Awake/alert Behavior During Therapy: WFL for tasks assessed/performed Overall Cognitive Status: Within Functional Limits for tasks assessed                                        Exercises      General Comments General comments (skin integrity, edema, etc.): difficulty with stairs lowering the DGI score, but still feel [name] with a lower risk of falling.      Pertinent Vitals/Pain Pain Assessment: No/denies pain    Home Living                      Prior Function            [name] Goals (current goals can now be found in the care plan section) Acute Rehab [name] Goals Patient Stated Goal: Get back home [name] Goal Formulation: With patient Time For Goal Achievement: 08/10/16 Potential to Achieve Goals: Good    Frequency    Min 3X/week      [name] Plan Current plan remains appropriate  Co-evaluation             End of Session   Activity Tolerance: Patient tolerated treatment well Patient left: in chair;with call bell/phone within reach Nurse Communication: Mobility status [name] Visit Diagnosis: Other abnormalities of gait and mobility (R26.89)     Time: 9381-0175 [name] Time Calculation (min) (ACUTE ONLY): 15 min  Charges:  $Gait Training: 8-22 mins                    G Codes:       09-01-16  [name], [name] [phone]  (pager)   [name] Sep 01, 2016, 4:48 PM

## 2016-08-09 ENCOUNTER — Inpatient Hospital Stay (HOSPITAL_COMMUNITY): Payer: Medicare HMO

## 2016-08-09 LAB — CBC
HCT: 30.1 % — ABNORMAL LOW (ref 36.0–46.0)
HEMATOCRIT: 37.9 % (ref 36.0–46.0)
HEMOGLOBIN: 10 g/dL — AB (ref 12.0–15.0)
HEMOGLOBIN: 12.5 g/dL (ref 12.0–15.0)
MCH: 31 pg (ref 26.0–34.0)
MCH: 31.1 pg (ref 26.0–34.0)
MCHC: 33.2 g/dL (ref 30.0–36.0)
MCHC: 33 g/dL (ref 30.0–36.0)
MCV: 93.2 fL (ref 78.0–100.0)
MCV: 94.3 fL (ref 78.0–100.0)
Platelets: 39 10*3/uL — ABNORMAL LOW (ref 150–400)
Platelets: 44 10*3/uL — ABNORMAL LOW (ref 150–400)
RBC: 3.23 MIL/uL — ABNORMAL LOW (ref 3.87–5.11)
RBC: 4.02 MIL/uL (ref 3.87–5.11)
RDW: 23 % — ABNORMAL HIGH (ref 11.5–15.5)
RDW: 23.9 % — ABNORMAL HIGH (ref 11.5–15.5)
WBC: 4 10*3/uL (ref 4.0–10.5)
WBC: 5.3 10*3/uL (ref 4.0–10.5)

## 2016-08-09 LAB — RENAL FUNCTION PANEL
ALBUMIN: 3.4 g/dL — AB (ref 3.5–5.0)
ANION GAP: 11 (ref 5–15)
Albumin: 4.1 g/dL (ref 3.5–5.0)
Anion gap: 11 (ref 5–15)
BUN: 41 mg/dL — ABNORMAL HIGH (ref 6–20)
BUN: 9 mg/dL (ref 6–20)
CHLORIDE: 101 mmol/L (ref 101–111)
CHLORIDE: 99 mmol/L — AB (ref 101–111)
CO2: 24 mmol/L (ref 22–32)
CO2: 26 mmol/L (ref 22–32)
CREATININE: 6.63 mg/dL — AB (ref 0.44–1.00)
Calcium: 8 mg/dL — ABNORMAL LOW (ref 8.9–10.3)
Calcium: 8 mg/dL — ABNORMAL LOW (ref 8.9–10.3)
Creatinine, Ser: 3 mg/dL — ABNORMAL HIGH (ref 0.44–1.00)
GFR calc Af Amer: 6 mL/min — ABNORMAL LOW (ref >60)
GFR calc Af Amer: 17 mL/min — ABNORMAL LOW (ref >60)
GFR calc non Af Amer: 15 mL/min — ABNORMAL LOW (ref >60)
GFR, EST NON AFRICAN AMERICAN: 6 mL/min — AB (ref >60)
GLUCOSE: 79 mg/dL (ref 65–99)
GLUCOSE: 115 mg/dL — AB (ref 65–99)
POTASSIUM: 5.1 mmol/L (ref 3.5–5.1)
Phosphorus: 3.3 mg/dL (ref 2.5–4.6)
Phosphorus: 1.7 mg/dL — ABNORMAL LOW (ref 2.5–4.6)
Potassium: 3.4 mmol/L — ABNORMAL LOW (ref 3.5–5.1)
Sodium: 136 mmol/L (ref 135–145)
Sodium: 136 mmol/L (ref 135–145)

## 2016-08-09 MED ORDER — SODIUM CHLORIDE 0.9 % IV SOLN: 100.0000 mL | INTRAVENOUS | Status: DC | PRN

## 2016-08-09 MED ORDER — LIDOCAINE HCL (PF) 1 % IJ SOLN: 5.0000 mL | INTRAMUSCULAR | Status: DC | PRN

## 2016-08-09 MED ORDER — LIDOCAINE-PRILOCAINE 2.5-2.5 % EX CREA: 1.0000 "application " | TOPICAL_CREAM | CUTANEOUS | Status: DC | PRN

## 2016-08-09 MED ORDER — RENA-VITE PO TABS
1.0000 | ORAL_TABLET | Freq: Every day | ORAL | Status: DC
  Administered 2016-08-09 – 2016-08-13 (×5): 1 via ORAL
  Filled 2016-08-09 (×5): qty 1

## 2016-08-09 MED ORDER — HEPARIN SODIUM (PORCINE) 1000 UNIT/ML DIALYSIS: 1000.0000 [IU] | INTRAMUSCULAR | Status: DC | PRN

## 2016-08-09 MED ORDER — ALTEPLASE 2 MG IJ SOLR: 2.0000 mg | Freq: Once | INTRAMUSCULAR | Status: DC | PRN

## 2016-08-09 MED ORDER — PENTAFLUOROPROP-TETRAFLUOROETH EX AERO: 1.0000 "application " | INHALATION_SPRAY | CUTANEOUS | Status: DC | PRN

## 2016-08-09 MED ORDER — AMLODIPINE BESYLATE 5 MG PO TABS
5.0000 mg | ORAL_TABLET | Freq: Every day | ORAL | Status: DC
  Administered 2016-08-10 – 2016-08-13 (×3): 5 mg via ORAL
  Filled 2016-08-09 (×4): qty 1

## 2016-08-09 NOTE — Progress Notes (Signed)
Patient is in dialysis until 5:30 pm. Will do vein mapping first thing in the morning. Oda Cogan, BS, RDMS, RVT

## 2016-08-09 NOTE — Progress Notes (Signed)
Subjective: Interval History: has no complaint .  Objective: Vital signs in last 24 hours: Temp:  [98.5 F (36.9 C)-98.7 F (37.1 C)] 98.6 F (37 C) (04/03 0900) Pulse Rate:  [63-84] 63 (04/03 0900) Resp:  [17-18] 18 (04/03 0900) BP: (133-156)/(61-89) 145/61 (04/03 0900) SpO2:  [99 %-100 %] 100 % (04/03 0900) Weight:  [52.9 kg (116 lb 9.6 oz)] 52.9 kg (116 lb 9.6 oz) (04/02 2105) Weight change: -3.221 kg (-7 lb 1.6 oz)  Intake/Output from previous day: No intake/output data recorded. Intake/Output this shift: No intake/output data recorded.  General appearance: alert, cooperative and no distress Resp: clear to auscultation bilaterally Chest wall: IJ cath Cardio: S1, S2 normal and systolic murmur: systolic ejection 2/6, decrescendo at 2nd left intercostal space GI: soft nontender Extremities: soft,nontender  EXtrem no edema  Lab Results:  Recent Labs  08/08/16 0648 08/09/16 0513  WBC 5.4 4.0  HGB 10.4* 10.0*  HCT 31.7* 30.1*  PLT 53* 39*   BMET:  Recent Labs  08/08/16 0648 08/09/16 0513  NA 138 136  K 3.7 3.4*  CL 101 101  CO2 28 24  GLUCOSE 108* 79  BUN 30* 41*  CREATININE 5.32* 6.63*  CALCIUM 8.5* 8.0*   No results for input(s): PTH in the last 72 hours. Iron Studies: No results for input(s): IRON, TIBC, TRANSFERRIN, FERRITIN in the last 72 hours.  Studies/Results: No results found.  I have reviewed the patient's current medications.  Assessment/Plan: 1 ESRD Hd today.  Vol ok. 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

## 2016-08-09 NOTE — Progress Notes (Signed)
Dialysis treatment completed.  2500 mL ultrafiltrated.  2000 mL net fluid removal.  Patient status unchanged. Lung sounds diminished to ausculation in all fields. No edema. Cardiac: NSR.  Cleansed RIJ catheter with chlorhexidine.  Disconnected lines and flushed ports with saline per protocol.  Ports locked with heparin and capped per protocol.    Report given to bedside, RN Wells Guiles.

## 2016-08-09 NOTE — Progress Notes (Signed)
PROGRESS NOTE                                                                                                                                                                                                             Patient Demographics:    [name], is a 71 y.o. female, DOB - 06/08/1945, BVA:[id number]  Admit date - 07/20/2016   Admitting Physician Waldemar Dickens, MD  Outpatient Primary MD for the patient is [facility], [place]  LOS - 20  Outpatient Specialists: NONE  No chief complaint on file.     Brief Narrative   71 y.o.femalewith medical history significant for COPD, esophageal stricture, GERD, hiatal hernia, hyperlipidemia, hypertension, IBS, cervical disc disease and history of lung nodules with extensive workup including lung biopsy ruling out Sjogren's disease. Pt has had recurrent history of sinusitis since last fall, treated with several rounds of antibiotics and corticosteroids which have not resulted in significant clinical improvement. She was hospitalized 2 months ago for fever/weight loss and relative hypotension with anorexia and night sweats and was treated for community-acquired pneumonia. She has continued to have sinus drainage with some epistaxis and bloody secretions and also had nausea with dysgeusia for the past few weeks or so. Pt also reported severe muscle pain and leg cramps over the same time period. Of note, pt is followed by Dr. Elsworth Soho for abnormal CT imaging &pulmonary nodules. Work up for this has included an open lung biopsy in 11/2008 in the setting of fever/night sweats and CT scan with pulmonary nodules which showed possible inflammatory pseudotumor vs reaction to old bronchopneumonia associated with some carcinoid tumorlets. Salivary  gland biospy 03/2009 was negative for Sjogren's disease and negative for autoimmune work up. Serial CT's were planned but she was lost to follow up.  She had a repeat CT of the chest in 06/2016 which showed volume loss, post surgical changes of the left hemithorax and RLL pulmonary nodules.   In regards to pt's creatinine, in January of 2018 was 1.7 and in February 2018 it ranged between 1.6 and 2.1 with a peak of 2.8 that prompted referral to nephrology. After evaluation 07/20/16, she was found to have an elevated creatinine level of 12.4 and given the high risk of suspicion for vasculitis pt was directly admitted to hospital for evaluation.  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 She was on solumedrol and then switched to PO prednisone. She is required to have plasmapheresis on alternate days for 2 weeks (started 07/22/2016).   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 Renal started on Cyclophosphamide 100 mg PO QD.  - She has outpt HD established ,  TTS. ( has spot at [facility]) - Nephrology managing no plans to start cytotoxan at this juncture - Vascular on board and plans for vein mapping.  - repeat anti GBM of 34.  -has outpatient hemodialysis established. Plan is to d/c after access.  Essential hypertension - Continue Norvasc  -Improved.  Anemia of chronic renal disease -Aranesp when necessary with dialysis. Significantly  improved hemoglobin today.  Hyperphosphatemia - Low  phosphorus diet, monitor with hemodialysis - PTH level 109  Thrombocytopenia Mild. Monitor  Leukocytosis Possibly due to steroid. No signs of infection.  Dyslipidemia - Continue Lipitor   RLL pulmonary nodules - Pulm recommended prophylactic bactrim while on immunosuppresion - Repeat CT imaging done this admission shows unchanged pulmonary nodule. If comparative CT in 2 months unchanged and no evidence of renal recover, doubt that there is benefit of continued therapy. - Monitor for hemoptysis - Follow up with Dr. Elsworth Soho at discharge >had prior PET arranged for May 2018 to review pulmonary nodules.   Constipation Not relieved with  Dulcolax suppository and MiraLAX. Ordered tap water enema.  Code Status : Full code  Family Communication  : None at bedside  Disposition Plan  : Home  once no further plasma exchange needed   Barriers For Discharge : D/c After access, vein mapping in planned.  Consults  :   Nephrology Pulmonary IR    Procedures    RIJ cathter for HD placed 07/20/2016  Renal biopsy 07/21/2016  Plasmapheresis 07/22/16  HD TTS  Cytoxan - started 07/27/16  DVT Prophylaxis  :  SCDs ( no heparin for 1 month post renal bx)  Lab Results  Component Value Date   PLT 39 (L) 08/09/2016    Antibiotics  :    Anti-infectives    Start     Dose/Rate Route Frequency Ordered Stop   07/27/16 0900  sulfamethoxazole-trimethoprim (BACTRIM DS,SEPTRA DS) 800-160 MG per tablet 1 tablet     1 tablet Oral Once per day on Mon Wed Fri 07/26/16 1639     07/20/16 1430  ceFAZolin (ANCEF) IVPB 2g/100 mL premix     2 g 200 mL/hr over 30 Minutes Intravenous To Radiology 07/20/16 1424 07/20/16 1641        Objective:   Vitals:   08/09/16 1400 08/09/16 1430 08/09/16 1500 08/09/16 1530  BP: (!) 167/77 (!) 153/78 (!) 153/80 (!) 145/88  Pulse: 71 66 68 73  Resp:      Temp:      TempSrc:      SpO2:  Weight:      Height:        Wt Readings from Last 3 Encounters:   08/09/16 52.6 kg (115 lb 15.4 oz)  06/29/16 65.8 kg (145 lb)  06/19/16 64 kg (141 lb)     Intake/Output Summary (Last 24 hours) at 08/09/16 1614 Last data filed at 08/09/16 0830  Gross per 24 hour  Intake              240 ml  Output                0 ml  Net              240 ml    Physical Exam  Gen: alert and awake, in nad. HEENT: moist mucosa,  rt temp HD catheter, atraumatic, normocephalic Chest: clear b/l, no wheezes, equal chest rise. CVS: N S1&S2, no murmurs,  GI: soft, NT, ND, Musculoskeletal: warm, no edema     Data Review:    CBC  Recent Labs Lab 08/05/16 1222 08/06/16 0431 08/07/16 0437 08/08/16 0648 08/09/16 0513  WBC 10.9* 8.0 6.7 5.4 4.0  HGB 9.6* 9.0* 9.5* 10.4* 10.0*  HCT 28.5* 26.4* 28.0* 31.7* 30.1*  PLT 42* 42* 47* 53* 39*  MCV 88.0 87.7 90.3 91.9 93.2  MCH 29.6 29.9 30.6 30.1 31.0  MCHC 33.7 34.1 33.9 32.8 33.2  RDW 18.2* 18.7* 20.5* 21.6* 23.0*    Chemistries   Recent Labs Lab 08/05/16 0551 08/06/16 0431 08/07/16 0437 08/08/16 0648 08/09/16 0513  NA 136 138 137 138 136  K 4.1 3.5 3.4* 3.7 3.4*  CL 99* 99* 99* 101 101  CO2 _0 GLUCOSE 94 137* 98 108* 79  BUN 17 34* 15 30* 41*  CREATININE 3.53* 5.09* 3.41* 5.32* 6.63*  CALCIUM 8.5* 8.3* 7.9* 8.5* 8.0*   ------------------------------------------------------------------------------------------------------------------ No results for input(s): CHOL, HDL, LDLCALC, TRIG, CHOLHDL, LDLDIRECT in the last 72 hours.  Lab Results  Component Value Date   HGBA1C 5.8 02/02/2010   ------------------------------------------------------------------------------------------------------------------ No results for input(s): TSH, T4TOTAL, T3FREE, THYROIDAB in the last 72 hours.  Invalid input(s): FREET3 ------------------------------------------------------------------------------------------------------------------ No results for input(s): VITAMINB12, FOLATE, FERRITIN, TIBC, IRON,  RETICCTPCT in the last 72 hours.  Coagulation profile No results for input(s): INR, PROTIME in the last 168 hours.  No results for input(s): DDIMER in the last 72 hours.  Cardiac Enzymes No results for input(s): CKMB, TROPONINI, MYOGLOBIN in the last 168 hours.  Invalid input(s): CK ------------------------------------------------------------------------------------------------------------------ No results found for: BNP  Inpatient Medications  Scheduled Meds: . sodium chloride   Intravenous Once  . sodium chloride   Intravenous Once  . [START ON 08/10/2016] amLODipine  5 mg Oral QHS  . aspirin EC  81 mg Oral Daily  . atorvastatin  10 mg Oral q1800  . bisacodyl  10 mg Rectal Daily  . darbepoetin (ARANESP) injection - DIALYSIS  150 mcg Intravenous Q Sat-HD  . feeding supplement  1 Container Oral BID BM  . ferric gluconate (FERRLECIT/NULECIT) IV  125 mg Intravenous Q T,Th,Sa-HD  . fluticasone  2 spray Each Nare QHS  . hydrocortisone   Rectal TID  . loratadine  10 mg Oral Daily  . multivitamin  1 tablet Oral QHS  . pantoprazole  40 mg Oral Daily  . polyethylene glycol  17 g Oral Daily  . predniSONE  20 mg Oral Q breakfast  . sodium chloride  2 spray Each Nare TID  . sodium chloride flush  3  mL Intravenous Q12H  . sulfamethoxazole-trimethoprim  1 tablet Oral Once per day on Mon Wed Fri   Continuous Infusions:  PRN Meds:.sodium chloride, calcium carbonate, methocarbamol (ROBAXIN)  IV, ondansetron **OR** ondansetron (ZOFRAN) IV, senna, sodium chloride flush, zolpidem  Micro Results No results found for this or any previous visit (from the past 240 hour(s)).  Radiology Reports Ct Chest Wo Contrast  Result Date: 07/29/2016 CLINICAL DATA:  71 year old female inpatient admitted for 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 Longstanding history of indeterminate pulmonary nodules, described as inflammatory pseudotumors on surgical lung biopsy of the left  lung on 11/06/2008. EXAM: CT CHEST WITHOUT CONTRAST TECHNIQUE: Multidetector CT imaging of the chest was performed following the standard protocol without IV contrast. COMPARISON:  06/20/2016 chest CT.  07/26/2016 chest radiograph. FINDINGS: Cardiovascular: Top-normal heart size . No significant pericardial fluid/thickening. Right internal jugular dialysis catheter terminates at the cavoatrial junction. Atherosclerotic nonaneurysmal thoracic aorta. Stable dilated pulmonary arteries (main pulmonary artery diameter 3.4 cm). Mediastinum/Nodes: Stable heterogeneous thyroid gland with possible confluent small hypodense thyroid nodules. 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 No pathologically enlarged axillary, mediastinal or gross hilar lymph nodes, noting limited sensitivity for the detection of hilar adenopathy on this noncontrast study. Lungs/Pleura: No pneumothorax. New trace dependent bilateral pleural effusions. Stable postsurgical changes from wedge resection in the left upper and left lower lobes. Mild centrilobular and paraseptal emphysema with mild diffuse bronchial wall thickening. No acute consolidative airspace disease or new significant pulmonary nodules. Stable parenchymal band in the posterior right upper lobe with associated distortion, compatible with postinfectious/postinflammatory scarring. Stable chronic thickening of the peribronchovascular interstitium throughout both lungs with associated chronic mild distortion. Scattered irregular pulmonary nodules in the right lung are unchanged in the interval and not convincingly changed back to 12/04/2009 chest CT, compatible with benign inflammatory nodules. For example a 1.5 x 1.0 cm peripheral right upper lobe nodule (series 4/ image 41), measuring 1.5 x 1.0 cm on 06/20/2016 and 1.5 x 0.9 cm on 12/04/2009. Partially cystic irregular right lower lobe 2.2 x  1.7 cm nodule, measuring 2.3 x 1.8 cm on 06/20/2016 and 2.3 x 1.9 cm on 12/04/2009. Upper abdomen: Unremarkable. Musculoskeletal: No aggressive appearing focal osseous lesions. Mild thoracic spondylosis. New mild anasarca. IMPRESSION: 1. New mild anasarca and trace dependent bilateral pleural effusions. Top-normal heart size. 2. Scattered irregular pulmonary nodules in the right lung are stable in the interval and not convincingly changed back to 2011, compatible with benign inflammatory nodules given the results of the 2010 surgical left lung biopsy. 3. Stable postsurgical and postinflammatory scarring in both lungs. No acute pulmonary disease. 4. Mild emphysema with mild diffuse bronchial wall thickening, suggesting COPD. 5. Aortic atherosclerosis. 6. Stable dilated main pulmonary artery, suggesting pulmonary arterial hypertension. Electronically Signed   By: Ilona Sorrel M.D.   On: 07/29/2016 15:48   US Biopsy  Result Date: 07/21/2016 INDICATION: Acute renal failure EXAM: ULTRASOUND-GUIDED BIOPSY OF THE RENAL CORTEX.  RANDOM CORE. MEDICATIONS: None. ANESTHESIA/SEDATION: Fentanyl 50 mcg IV; Versed 1 mg IV Moderate Sedation Time:  10 The patient was continuously monitored during the procedure by the interventional radiology nurse under my direct supervision. FLUOROSCOPY TIME:  None. COMPLICATIONS: None immediate. PROCEDURE: Informed written consent was obtained from the patient after a thorough discussion of the procedural risks, benefits and alternatives. All questions were addressed. Maximal Sterile Barrier Technique was utilized including caps, mask, sterile  gowns, sterile gloves, sterile drape, hand hygiene and skin antiseptic. A timeout was performed prior to the initiation of the procedure. The back was prepped with ChloraPrep in a sterile fashion, and a sterile drape was applied covering the operative field. A sterile gown and sterile gloves were used for the procedure. Under sonographic guidance, 2 16  gauge core biopsies of the cortex of the lower pole of the left kidney were obtained. Final imaging was performed. Patient tolerated the procedure well without complication. Vital sign monitoring by nursing staff during the procedure will continue as patient is in the special procedures unit for post procedure observation. FINDINGS: The images document guide needle placement within the left lower pole renal cortex. Post biopsy images demonstrate no evidence of hemorrhage. IMPRESSION: Successful ultrasound-guided random renal cortex core biopsy. Electronically Signed   By: Marybelle Killings M.D.   On: 07/21/2016 14:36   Ir Fluoro Guide Cv Line Right  Result Date: 07/20/2016 INDICATION: End-stage renal disease. In need of durable intravenous access for the initiation of dialysis. EXAM: TUNNELED CENTRAL VENOUS HEMODIALYSIS CATHETER PLACEMENT WITH ULTRASOUND AND FLUOROSCOPIC GUIDANCE MEDICATIONS: Ancef 2 gm IV . The antibiotic was given in an appropriate time interval prior to skin puncture. ANESTHESIA/SEDATION: Versed 1 mg IV; Fentanyl 50 mcg IV; Moderate Sedation Time:  17 minutes The patient was continuously monitored during the procedure by the interventional radiology nurse under my direct supervision. FLUOROSCOPY TIME:  Fluoroscopy Time: 12 seconds (2 mGy). COMPLICATIONS: None immediate. PROCEDURE: Informed written consent was obtained from the patient after a discussion of the risks, benefits, and alternatives to treatment. Questions regarding the procedure were encouraged and answered. The right neck and chest were prepped with chlorhexidine in a sterile fashion, and a sterile drape was applied covering the operative field. Maximum barrier sterile technique with sterile gowns and gloves were used for the procedure. A timeout was performed prior to the initiation of the procedure. After creating a small venotomy incision, a micropuncture kit was utilized to access the right internal jugular vein under direct,  real-time ultrasound guidance after the overlying soft tissues were anesthetized with 1% lidocaine with epinephrine. Ultrasound image documentation was performed. The microwire was kinked to measure appropriate catheter length. A stiff Glidewire was advanced to the level of the IVC and the micropuncture sheath was exchanged for a peel-away sheath. A Palindrome tunneled hemodialysis catheter measuring 19 cm from tip to cuff was tunneled in a retrograde fashion from the anterior chest wall to the venotomy incision. The catheter was then placed through the peel-away sheath with tips ultimately positioned within the superior aspect of the right atrium. Final catheter positioning was confirmed and documented with a spot radiographic image. The catheter aspirates and flushes normally. The catheter was flushed with appropriate volume heparin dwells. The catheter exit site was secured with a 0-Prolene retention suture. The venotomy incision was closed with an interrupted 4-0 Vicryl, Dermabond and Steri-strips. Dressings were applied. The patient tolerated the procedure well without immediate post procedural complication. IMPRESSION: Successful placement of 19 cm tip to cuff tunneled hemodialysis catheter via the right internal jugular vein with tips terminating within the superior aspect of the right atrium. The catheter is ready for immediate use. Electronically Signed   By: [name] M.D.   On: 07/20/2016 17:12   Ir US Guide Vasc Access Right  Result Date: 07/20/2016 INDICATION: End-stage renal disease. In need of durable intravenous access for the initiation of dialysis. EXAM: TUNNELED CENTRAL VENOUS HEMODIALYSIS CATHETER PLACEMENT WITH ULTRASOUND  AND FLUOROSCOPIC GUIDANCE MEDICATIONS: Ancef 2 gm IV . The antibiotic was given in an appropriate time interval prior to skin puncture. ANESTHESIA/SEDATION: Versed 1 mg IV; Fentanyl 50 mcg IV; Moderate Sedation Time:  17 minutes The patient was continuously monitored  during the procedure by the interventional radiology nurse under my direct supervision. FLUOROSCOPY TIME:  Fluoroscopy Time: 12 seconds (2 mGy). COMPLICATIONS: None immediate. PROCEDURE: Informed written consent was obtained from the patient after a discussion of the risks, benefits, and alternatives to treatment. Questions regarding the procedure were encouraged and answered. The right neck and chest were prepped with chlorhexidine in a sterile fashion, and a sterile drape was applied covering the operative field. Maximum barrier sterile technique with sterile gowns and gloves were used for the procedure. A timeout was performed prior to the initiation of the procedure. After creating a small venotomy incision, a micropuncture kit was utilized to access the right internal jugular vein under direct, real-time ultrasound guidance after the overlying soft tissues were anesthetized with 1% lidocaine with epinephrine. Ultrasound image documentation was performed. The microwire was kinked to measure appropriate catheter length. A stiff Glidewire was advanced to the level of the IVC and the micropuncture sheath was exchanged for a peel-away sheath. A Palindrome tunneled hemodialysis catheter measuring 19 cm from tip to cuff was tunneled in a retrograde fashion from the anterior chest wall to the venotomy incision. The catheter was then placed through the peel-away sheath with tips ultimately positioned within the superior aspect of the right atrium. Final catheter positioning was confirmed and documented with a spot radiographic image. The catheter aspirates and flushes normally. The catheter was flushed with appropriate volume heparin dwells. The catheter exit site was secured with a 0-Prolene retention suture. The venotomy incision was closed with an interrupted 4-0 Vicryl, Dermabond and Steri-strips. Dressings were applied. The patient tolerated the procedure well without immediate post procedural complication.  IMPRESSION: Successful placement of 19 cm tip to cuff tunneled hemodialysis catheter via the right internal jugular vein with tips terminating within the superior aspect of the right atrium. The catheter is ready for immediate use. Electronically Signed   By: [name] M.D.   On: 07/20/2016 17:12   Dg Chest Port 1 View  Result Date: 07/26/2016 CLINICAL DATA:  Pulmonary nodules EXAM: PORTABLE CHEST 1 VIEW COMPARISON:  06/19/2016, chest CT 06/20/2016 FINDINGS: Right dialysis catheter is in place with the tip at the cavoatrial junction. Postoperative changes on the left. Areas of scarring and nodularity in the left lung are stable. Decreasing airspace opacities in the right lung with some residual areas of nodularity. Heart is borderline in size. No visible effusions. IMPRESSION: Stable scarring and nodularity in the left lung with postoperative changes. Improving areas of nodularity in the right lung. Electronically Signed   By: Rolm Baptise M.D.   On: 07/26/2016 16:30   Ct Maxillofacial Wo Contrast  Result Date: 07/20/2016 CLINICAL DATA:  71 y/o  F; recurrent sinusitis. EXAM: CT MAXILLOFACIAL WITHOUT CONTRAST TECHNIQUE: Multidetector CT imaging of the maxillofacial structures was performed. Multiplanar CT image reconstructions were also generated. A small metallic BB was placed on the right temple in order to reliably differentiate right from left. COMPARISON:  03/13/2016 MRI head.  12/02/2015 CT head. FINDINGS: Frontal sinus: Normally aerated. Patent frontal sinus drainage pathways. Ethmoid sinus: Normally aerated. Maxillary sinuses:  Normally aerated. Sphenoid sinus: Normally aerated. Patent sphenoid ethmoidal recesses. Right ostiomeatal unit:  Patent. Left ostiomeatal unit:  Patent. Nasal passages: Patent. Intact nasal septum.  Mild leftward nasal septal deviation and small spur. Other: Orbits and intracranial compartment are unremarkable. Mastoid air cells are normally pneumatized. IMPRESSION: Normally  aerated paranasal sinuses and patent sinus drainage pathways. Electronically Signed   By: Kristine Garbe M.D.   On: 07/20/2016 14:22    Time Spent in minutes  25   Velvet Bathe M.D on 08/09/2016 at 4:14 PM  Between 7am to 7pm - Pager - [phone]  After 7pm go to [email or web address] - password [email or web address]  [facility] -  Office  [phone]

## 2016-08-09 NOTE — Progress Notes (Signed)
  [facility] Progress Note  Vein mapping still pending. Platelets trending down. Patient tentatively scheduled for access on Friday. Will follow along. Discussed with patient.   Virgina Jock, PA-C [facility] Office: [phone] Pager: [phone] 08/09/2016 12:25 PM

## 2016-08-09 NOTE — Progress Notes (Signed)
Patient arrived to unit by bed.  Reviewed treatment plan and this RN agrees with plan.  Report received from bedside RN, Wells Guiles.  Consent verified.  Patient A & O X 4.   Lung sounds clear to ausculation in all fields. No edema. Cardiac:  Regular R&R.  Removed caps and cleansed RIJ catheter with chlorhedxidine.  Aspirated ports of heparin and flushed them with saline per protocol.  Connected and secured lines, initiated treatment at 1258.  UF Goal of 2500 mL and net fluid removal 2 L.  Will continue to monitor.

## 2016-08-09 NOTE — Procedures (Signed)
I was present at this session.  I have reviewed the session itself and made appropriate changes.  Hd via PC. bp ok. Below dry,lower  Evaleen Sant L 4/3/20181:55 PM

## 2016-08-10 ENCOUNTER — Inpatient Hospital Stay (HOSPITAL_COMMUNITY): Payer: Medicare HMO

## 2016-08-10 DIAGNOSIS — N189 Chronic kidney disease, unspecified: Secondary | ICD-10-CM

## 2016-08-10 DIAGNOSIS — N179 Acute kidney failure, unspecified: Secondary | ICD-10-CM

## 2016-08-10 LAB — CBC
HCT: 34.1 % — ABNORMAL LOW (ref 36.0–46.0)
Hemoglobin: 11.2 g/dL — ABNORMAL LOW (ref 12.0–15.0)
MCH: 31 pg (ref 26.0–34.0)
MCHC: 32.8 g/dL (ref 30.0–36.0)
MCV: 94.5 fL (ref 78.0–100.0)
PLATELETS: 41 10*3/uL — AB (ref 150–400)
RBC: 3.61 MIL/uL — AB (ref 3.87–5.11)
RDW: 24 % — ABNORMAL HIGH (ref 11.5–15.5)
WBC: 3.8 10*3/uL — AB (ref 4.0–10.5)

## 2016-08-10 LAB — RENAL FUNCTION PANEL
ANION GAP: 11 (ref 5–15)
Albumin: 3.5 g/dL (ref 3.5–5.0)
BUN: 13 mg/dL (ref 6–20)
CHLORIDE: 100 mmol/L — AB (ref 101–111)
CO2: 25 mmol/L (ref 22–32)
CREATININE: 3.73 mg/dL — AB (ref 0.44–1.00)
Calcium: 8 mg/dL — ABNORMAL LOW (ref 8.9–10.3)
GFR calc non Af Amer: 11 mL/min — ABNORMAL LOW (ref >60)
GFR, EST AFRICAN AMERICAN: 13 mL/min — AB (ref >60)
Glucose, Bld: 102 mg/dL — ABNORMAL HIGH (ref 65–99)
PHOSPHORUS: 3 mg/dL (ref 2.5–4.6)
POTASSIUM: 4.9 mmol/L (ref 3.5–5.1)
SODIUM: 136 mmol/L (ref 135–145)

## 2016-08-10 MED ORDER — ACETAMINOPHEN 325 MG PO TABS
650.0000 mg | ORAL_TABLET | Freq: Four times a day (QID) | ORAL | Status: DC | PRN
  Administered 2016-08-10 – 2016-08-13 (×3): 650 mg via ORAL
  Filled 2016-08-10: qty 2

## 2016-08-10 NOTE — Progress Notes (Signed)
Upper Extremity Vein Map   Right Basilic  Segment Diameter Depth Comment  1. Axilla 2.4 mm 8.4 mm   2. Mid upper arm 2.5 mm 9.7 mm   3. Above AC mm mm   4. In AC 2.6 mm 5.5 mm   5. Below AC 1.6 mm 2.7 mm   6. Mid forearm 1.3 mm 2.9 mm   7. Wrist 1.3 mm 2.1 mm    Left Cephalic  Segment Diameter Depth Comment  1. Axilla 2.7 mm 6.0 mm   2. Mid upper arm 3.1 mm 6.0 mm   3. Above AC 1.9 mm 3.8 mm  branch  4. In AC 2.2 mm 2.7 mm branch  5. Below AC 1.1 mm 3.5 mm   6. Mid forearm 1.1 mm 5.4 mm    Left Basilic  Segment Diameter Depth Comment  1. Axilla 5.0 mm 1.1 mm   2. Mid upper arm 3.3 mm 7.4 mm branch  3. Above AC 2.4 mm 1.1 mm   4. In AC 2.6 mm 5.3 mm   5. Below AC 1.6 mm 2.5 mm   6. Mid forearm 0.8 mm 3.0 mm    Lita Mains- RDMS, RVT 12:20 PM  08/10/2016

## 2016-08-10 NOTE — Progress Notes (Signed)
Subjective: Interval History: has no complaint.  Objective: Vital signs in last 24 hours: Temp:  [97.9 F (36.6 C)-99.7 F (37.6 C)] 98.3 F (36.8 C) (04/04 0937) Pulse Rate:  [66-91] 84 (04/04 0937) Resp:  [14-20] 17 (04/04 0937) BP: (117-167)/(62-88) 117/65 (04/04 0937) SpO2:  [98 %-100 %] 99 % (04/04 0937) Weight:  [50.6 kg (111 lb 8.8 oz)-52.6 kg (115 lb 15.4 oz)] 50.6 kg (111 lb 8.8 oz) (04/03 1713) Weight change: -0.289 kg (-10.2 oz)  Intake/Output from previous day: 04/03 0701 - 04/04 0700 In: 410 [P.O.:300; IV Piggyback:110] Out: 2125 [Urine:125] Intake/Output this shift: Total I/O In: 240 [P.O.:240] Out: 0   General appearance: alert, cooperative and no distress Resp: clear to auscultation bilaterally Chest wall: IJ PC Cardio: S1, S2 normal and systolic murmur: holosystolic 2/6, blowing at apex GI: soft, non-tender; bowel sounds normal; no masses,  no organomegaly Extremities: extremities normal, atraumatic, no cyanosis or edema  Lab Results:  Recent Labs  08/09/16 2131 08/10/16 0654  WBC 5.3 3.8*  HGB 12.5 11.2*  HCT 37.9 34.1*  PLT 44* 41*   BMET:  Recent Labs  08/09/16 2131 08/10/16 0654  NA 136 136  K 5.1 4.9  CL 99* 100*  CO2 26 25  GLUCOSE 115* 102*  BUN 9 13  CREATININE 3.00* 3.73*  CALCIUM 8.0* 8.0*   No results for input(s): PTH in the last 72 hours. Iron Studies: No results for input(s): IRON, TIBC, TRANSFERRIN, FERRITIN in the last 72 hours.  Studies/Results: No results found.  I have reviewed the patient's current medications.  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

## 2016-08-10 NOTE — Progress Notes (Signed)
PROGRESS NOTE                                                                                                                                                                                                             Patient Demographics:    [name], is a 71 y.o. female, DOB - 01-12-1946, JSR:[id number]  Admit date - 07/20/2016   Admitting Physician Waldemar Dickens, MD  Outpatient Primary MD for the patient is [facility], MD  LOS - 21  Outpatient Specialists: NONE  No chief complaint on file.     Brief Narrative   71 y.o.femalewith medical history significant for COPD, esophageal stricture, GERD, hiatal hernia, hyperlipidemia, hypertension, IBS, cervical disc disease and history of lung nodules with extensive workup including lung biopsy ruling out Sjogren's disease. Pt has had recurrent history of sinusitis since last fall, treated with several rounds of antibiotics and corticosteroids which have not resulted in significant clinical improvement. She was hospitalized 2 months ago for fever/weight loss and relative hypotension with anorexia and night sweats and was treated for community-acquired pneumonia. She has continued to have sinus drainage with some epistaxis and bloody secretions and also had nausea with dysgeusia for the past few weeks or so. Pt also reported severe muscle pain and leg cramps over the same time period. Of note, pt is followed by Dr. Elsworth Soho for abnormal CT imaging &pulmonary nodules. Work up for this has included an open lung biopsy in 11/2008 in the setting of fever/night sweats and CT scan with pulmonary nodules which showed possible inflammatory pseudotumor vs reaction to old bronchopneumonia associated with some carcinoid tumorlets. Salivary  gland biospy 03/2009 was negative for Sjogren's disease and negative for autoimmune work up. Serial CT's were planned but she was lost to follow up.  She had a repeat CT of the chest in 06/2016 which showed volume loss, post surgical changes of the left hemithorax and RLL pulmonary nodules.   In regards to pt's creatinine, in January of 2018 was 1.7 and in February 2018 it ranged between 1.6 and 2.1 with a peak of 2.8 that prompted referral to nephrology. After evaluation 07/20/16, she was found to have an elevated creatinine level of 12.4 and given the high risk of suspicion for vasculitis pt was directly admitted to hospital for evaluation.  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 She was on solumedrol and then switched to PO prednisone. She is required to have plasmapheresis on alternate days for 2 weeks (started 07/22/2016).   Subjective:   Patient was seen this morning. Ambulating comfortably in the room. Had dialysis yesterday. Denies dyspnea, chest pain, dizziness, lightheadedness or pain. No complaints reported.   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 Renal started on Cyclophosphamide 100 mg PO QD.  - She has outpt HD established ,  TTS. ( has spot at [facility]) - Nephrology managing no plans to start cytotoxan at this juncture - Vascular on board and plans access on? 08/12/16 - repeat anti GBM of 34.   Essential hypertension - Continue Norvasc . Controlled  Anemia of chronic renal disease -Aranesp when necessary with  dialysis. Stable.  Hyperphosphatemia - Low phosphorus diet, monitor with hemodialysis - PTH level 109  Thrombocytopenia Platelet count in the 40s but stable. No bleeding reported.  Leukocytosis Resolved. Now mildly leukopenic. Follow CBCs periodically.  Dyslipidemia - Continue Lipitor   RLL pulmonary nodules - Pulm recommended prophylactic bactrim while on immunosuppresion - Repeat CT imaging done this admission shows unchanged pulmonary nodule. If comparative CT in 2 months unchanged and no evidence of renal recover, doubt that there is benefit of continued therapy. - Monitor for hemoptysis - Follow up with Dr. Elsworth Soho at discharge >had prior PET arranged for May 2018 to review pulmonary nodules.  Constipation Resolved. Continue bowel regimen.  Code Status : Full code  Family Communication  : None at bedside  Disposition Plan  : DC home when cleared by nephrology pending dialysis access.  Barriers For Discharge : D/c After access, vein mapping in planned.  Consults  :   Nephrology Pulmonary IR    Procedures    RIJ cathter for HD placed 07/20/2016  Renal biopsy 07/21/2016  Plasmapheresis 07/22/16  HD TTS  Cytoxan - started 07/27/16  DVT Prophylaxis  :  SCDs ( no heparin for 1 month post renal bx)  Lab Results  Component Value Date   PLT 41 (L) 08/10/2016    Antibiotics  :    Anti-infectives    Start     Dose/Rate Route Frequency Ordered Stop   07/27/16 0900  sulfamethoxazole-trimethoprim (BACTRIM DS,SEPTRA DS) 800-160 MG per tablet 1 tablet     1 tablet Oral Once per day on Mon Wed Fri 07/26/16 1639     07/20/16 1430  ceFAZolin (ANCEF) IVPB 2g/100 mL premix     2 g 200 mL/hr over 30 Minutes Intravenous To Radiology 07/20/16 1424 07/20/16 1641        Objective:   Vitals:   08/09/16 2127 08/10/16 0621 08/10/16 0937 08/10/16 1649  BP: 138/81 138/76 117/65 124/71  Pulse: 91 81 84 78  Resp: _0 Temp: 99.7 F (37.6 C) 98.4 F (36.9 C)  98.3 F (36.8 C) 98.9 F (37.2 C)  TempSrc:  Oral Oral Oral Oral  SpO2: 98% 100% 99% 99%  Weight:      Height:        Wt Readings from Last 3 Encounters:  08/09/16 50.6 kg (111 lb 8.8 oz)  06/29/16 65.8 kg (145 lb)  06/19/16 64 kg (141 lb)     Intake/Output Summary (Last 24 hours) at 08/10/16 1748 Last data filed at 08/10/16 1423  Gross per 24 hour  Intake              650 ml  Output              125 ml  Net              525 ml    Physical Exam  Gen: alert and awake, in nad.Seen ambulating comfortably in the room. HEENT: moist mucosa,  rt temp HD catheter, atraumatic, normocephalic Chest: clear b/l, no wheezes, equal chest rise. No increased work of breathing. CVS: N S1&S2, no murmurs, no pedal edema or JVD. Not on telemetry. GI: soft, NT, ND, Musculoskeletal: warm, no edema CNS: Alert and oriented. No focal neurological deficits.     Data Review:    CBC  Recent Labs Lab 08/07/16 0437 08/08/16 0648 08/09/16 0513 08/09/16 2131 08/10/16 0654  WBC 6.7 5.4 4.0 5.3 3.8*  HGB 9.5* 10.4* 10.0* 12.5 11.2*  HCT 28.0* 31.7* 30.1* 37.9 34.1*  PLT 47* 53* 39* 44* 41*  MCV 90.3 91.9 93.2 94.3 94.5  MCH 30.6 30.1 31.0 31.1 31.0  MCHC 33.9 32.8 33.2 33.0 32.8  RDW 20.5* 21.6* 23.0* 23.9* 24.0*    Chemistries   Recent Labs Lab 08/07/16 0437 08/08/16 0648 08/09/16 0513 08/09/16 2131 08/10/16 0654  NA 137 138 136 136 136  K 3.4* 3.7 3.4* 5.1 4.9  CL 99* 101 101 99* 100*  CO2 _0 GLUCOSE 98 108* 79 115* 102*  BUN 15 30* 41* 9 13  CREATININE 3.41* 5.32* 6.63* 3.00* 3.73*  CALCIUM 7.9* 8.5* 8.0* 8.0* 8.0*   ------------------------------------------------------------------------------------------------------------------ No results for input(s): CHOL, HDL, LDLCALC, TRIG, CHOLHDL, LDLDIRECT in the last 72 hours.  Lab Results  Component Value Date   HGBA1C 5.8 02/02/2010    ------------------------------------------------------------------------------------------------------------------ No results for input(s): TSH, T4TOTAL, T3FREE, THYROIDAB in the last 72 hours.  Invalid input(s): FREET3 ------------------------------------------------------------------------------------------------------------------ No results for input(s): VITAMINB12, FOLATE, FERRITIN, TIBC, IRON, RETICCTPCT in the last 72 hours.  Coagulation profile No results for input(s): INR, PROTIME in the last 168 hours.  No results for input(s): DDIMER in the last 72 hours.  Cardiac Enzymes No results for input(s): CKMB, TROPONINI, MYOGLOBIN in the last 168 hours.  Invalid input(s): CK ------------------------------------------------------------------------------------------------------------------ No results found for: BNP  Inpatient Medications  Scheduled Meds: . sodium chloride   Intravenous Once  . sodium chloride   Intravenous Once  . amLODipine  5 mg Oral QHS  . aspirin EC  81 mg Oral Daily  . atorvastatin  10 mg Oral q1800  . bisacodyl  10 mg Rectal Daily  . darbepoetin (ARANESP) injection - DIALYSIS  150 mcg Intravenous Q Sat-HD  . feeding supplement  1 Container Oral BID BM  . ferric gluconate (FERRLECIT/NULECIT) IV  125 mg Intravenous Q T,Th,Sa-HD  . fluticasone  2 spray Each Nare QHS  . hydrocortisone   Rectal TID  . loratadine  10 mg Oral Daily  . multivitamin  1 tablet Oral QHS  . pantoprazole  40 mg Oral Daily  . polyethylene glycol  17 g Oral Daily  . predniSONE  20 mg Oral Q breakfast  . sodium chloride  2 spray Each Nare TID  . sodium chloride flush  3 mL Intravenous Q12H  . sulfamethoxazole-trimethoprim  1 tablet Oral Once per day on Mon Wed Fri   Continuous Infusions:  PRN Meds:.sodium chloride, sodium chloride, sodium chloride, acetaminophen, alteplase, calcium carbonate, heparin, lidocaine (PF), lidocaine-prilocaine, methocarbamol (ROBAXIN)  IV, ondansetron  **OR** ondansetron (ZOFRAN) IV, pentafluoroprop-tetrafluoroeth, senna, sodium chloride flush, zolpidem  Micro Results No results found for this or any previous visit (from the past 240 hour(s)).  Radiology Reports Ct Chest Wo Contrast  Result Date: 07/29/2016 CLINICAL DATA:  71 year old female inpatient admitted for 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 Longstanding history of indeterminate pulmonary nodules, described as inflammatory pseudotumors on surgical lung biopsy of the left lung on 11/06/2008. EXAM: CT CHEST WITHOUT CONTRAST TECHNIQUE: Multidetector CT imaging of the chest was performed following the standard protocol without IV contrast. COMPARISON:  06/20/2016 chest CT.  07/26/2016 chest radiograph. FINDINGS: Cardiovascular: Top-normal heart size . No significant pericardial fluid/thickening. Right internal jugular dialysis catheter terminates at the cavoatrial junction. Atherosclerotic nonaneurysmal thoracic aorta. Stable dilated pulmonary arteries (main pulmonary artery diameter 3.4 cm). Mediastinum/Nodes: Stable heterogeneous thyroid gland with possible confluent small hypodense thyroid nodules. 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 No pathologically enlarged axillary, mediastinal or gross hilar lymph nodes, noting limited sensitivity for the detection of hilar adenopathy on this noncontrast study. Lungs/Pleura: No pneumothorax. New trace dependent bilateral pleural effusions. Stable postsurgical changes from wedge resection in the left upper and left lower lobes. Mild centrilobular and paraseptal emphysema with mild diffuse bronchial wall thickening. No acute consolidative airspace disease or new significant pulmonary nodules. Stable parenchymal band in the posterior right upper lobe with associated distortion, compatible with postinfectious/postinflammatory  scarring. Stable chronic thickening of the peribronchovascular interstitium throughout both lungs with associated chronic mild distortion. Scattered irregular pulmonary nodules in the right lung are unchanged in the interval and not convincingly changed back to 12/04/2009 chest CT, compatible with benign inflammatory nodules. For example a 1.5 x 1.0 cm peripheral right upper lobe nodule (series 4/ image 41), measuring 1.5 x 1.0 cm on 06/20/2016 and 1.5 x 0.9 cm on 12/04/2009. Partially cystic irregular right lower lobe 2.2 x 1.7 cm nodule, measuring 2.3 x 1.8 cm on 06/20/2016 and 2.3 x 1.9 cm on 12/04/2009. Upper abdomen: Unremarkable. Musculoskeletal: No aggressive appearing focal osseous lesions. Mild thoracic spondylosis. New mild anasarca. IMPRESSION: 1. New mild anasarca and trace dependent bilateral pleural effusions. Top-normal heart size. 2. Scattered irregular pulmonary nodules in the right lung are stable in the interval and not convincingly changed back to 2011, compatible with benign inflammatory nodules given the results of the 2010 surgical left lung biopsy. 3. Stable postsurgical and postinflammatory scarring in both lungs. No acute pulmonary disease. 4. Mild emphysema with mild diffuse bronchial wall thickening, suggesting COPD. 5. Aortic atherosclerosis. 6. Stable dilated main pulmonary artery, suggesting pulmonary arterial hypertension. Electronically Signed   By: Ilona Sorrel M.D.   On: 07/29/2016 15:48   US Biopsy  Result Date: 07/21/2016 INDICATION: Acute renal failure EXAM: ULTRASOUND-GUIDED BIOPSY OF THE RENAL CORTEX.  RANDOM CORE. MEDICATIONS: None. ANESTHESIA/SEDATION: Fentanyl 50 mcg IV; Versed 1 mg IV Moderate Sedation Time:  10 The patient was continuously monitored during the procedure by the interventional radiology nurse under my direct supervision.  FLUOROSCOPY TIME:  None. COMPLICATIONS: None immediate. PROCEDURE: Informed written consent was obtained from the patient after a  thorough discussion of the procedural risks, benefits and alternatives. All questions were addressed. Maximal Sterile Barrier Technique was utilized including caps, mask, sterile gowns, sterile gloves, sterile drape, hand hygiene and skin antiseptic. A timeout was performed prior to the initiation of the procedure. The back was prepped with ChloraPrep in a sterile fashion, and a sterile drape was applied covering the operative field. A sterile gown and sterile gloves were used for the procedure. Under sonographic guidance, 2 16 gauge core biopsies of the cortex of the lower pole of the left kidney were obtained. Final imaging was performed. Patient tolerated the procedure well without complication. Vital sign monitoring by nursing staff during the procedure will continue as patient is in the special procedures unit for post procedure observation. FINDINGS: The images document guide needle placement within the left lower pole renal cortex. Post biopsy images demonstrate no evidence of hemorrhage. IMPRESSION: Successful ultrasound-guided random renal cortex core biopsy. Electronically Signed   By: Marybelle Killings M.D.   On: 07/21/2016 14:36   Ir Fluoro Guide Cv Line Right  Result Date: 07/20/2016 INDICATION: End-stage renal disease. In need of durable intravenous access for the initiation of dialysis. EXAM: TUNNELED CENTRAL VENOUS HEMODIALYSIS CATHETER PLACEMENT WITH ULTRASOUND AND FLUOROSCOPIC GUIDANCE MEDICATIONS: Ancef 2 gm IV . The antibiotic was given in an appropriate time interval prior to skin puncture. ANESTHESIA/SEDATION: Versed 1 mg IV; Fentanyl 50 mcg IV; Moderate Sedation Time:  17 minutes The patient was continuously monitored during the procedure by the interventional radiology nurse under my direct supervision. FLUOROSCOPY TIME:  Fluoroscopy Time: 12 seconds (2 mGy). COMPLICATIONS: None immediate. PROCEDURE: Informed written consent was obtained from the patient after a discussion of the risks,  benefits, and alternatives to treatment. Questions regarding the procedure were encouraged and answered. The right neck and chest were prepped with chlorhexidine in a sterile fashion, and a sterile drape was applied covering the operative field. Maximum barrier sterile technique with sterile gowns and gloves were used for the procedure. A timeout was performed prior to the initiation of the procedure. After creating a small venotomy incision, a micropuncture kit was utilized to access the right internal jugular vein under direct, real-time ultrasound guidance after the overlying soft tissues were anesthetized with 1% lidocaine with epinephrine. Ultrasound image documentation was performed. The microwire was kinked to measure appropriate catheter length. A stiff Glidewire was advanced to the level of the IVC and the micropuncture sheath was exchanged for a peel-away sheath. A Palindrome tunneled hemodialysis catheter measuring 19 cm from tip to cuff was tunneled in a retrograde fashion from the anterior chest wall to the venotomy incision. The catheter was then placed through the peel-away sheath with tips ultimately positioned within the superior aspect of the right atrium. Final catheter positioning was confirmed and documented with a spot radiographic image. The catheter aspirates and flushes normally. The catheter was flushed with appropriate volume heparin dwells. The catheter exit site was secured with a 0-Prolene retention suture. The venotomy incision was closed with an interrupted 4-0 Vicryl, Dermabond and Steri-strips. Dressings were applied. The patient tolerated the procedure well without immediate post procedural complication. IMPRESSION: Successful placement of 19 cm tip to cuff tunneled hemodialysis catheter via the right internal jugular vein with tips terminating within the superior aspect of the right atrium. The catheter is ready for immediate use. Electronically Signed   By: Eldridge Abrahams.D.  On:  07/20/2016 17:12   Ir US Guide Vasc Access Right  Result Date: 07/20/2016 INDICATION: End-stage renal disease. In need of durable intravenous access for the initiation of dialysis. EXAM: TUNNELED CENTRAL VENOUS HEMODIALYSIS CATHETER PLACEMENT WITH ULTRASOUND AND FLUOROSCOPIC GUIDANCE MEDICATIONS: Ancef 2 gm IV . The antibiotic was given in an appropriate time interval prior to skin puncture. ANESTHESIA/SEDATION: Versed 1 mg IV; Fentanyl 50 mcg IV; Moderate Sedation Time:  17 minutes The patient was continuously monitored during the procedure by the interventional radiology nurse under my direct supervision. FLUOROSCOPY TIME:  Fluoroscopy Time: 12 seconds (2 mGy). COMPLICATIONS: None immediate. PROCEDURE: Informed written consent was obtained from the patient after a discussion of the risks, benefits, and alternatives to treatment. Questions regarding the procedure were encouraged and answered. The right neck and chest were prepped with chlorhexidine in a sterile fashion, and a sterile drape was applied covering the operative field. Maximum barrier sterile technique with sterile gowns and gloves were used for the procedure. A timeout was performed prior to the initiation of the procedure. After creating a small venotomy incision, a micropuncture kit was utilized to access the right internal jugular vein under direct, real-time ultrasound guidance after the overlying soft tissues were anesthetized with 1% lidocaine with epinephrine. Ultrasound image documentation was performed. The microwire was kinked to measure appropriate catheter length. A stiff Glidewire was advanced to the level of the IVC and the micropuncture sheath was exchanged for a peel-away sheath. A Palindrome tunneled hemodialysis catheter measuring 19 cm from tip to cuff was tunneled in a retrograde fashion from the anterior chest wall to the venotomy incision. The catheter was then placed through the peel-away sheath with tips ultimately  positioned within the superior aspect of the right atrium. Final catheter positioning was confirmed and documented with a spot radiographic image. The catheter aspirates and flushes normally. The catheter was flushed with appropriate volume heparin dwells. The catheter exit site was secured with a 0-Prolene retention suture. The venotomy incision was closed with an interrupted 4-0 Vicryl, Dermabond and Steri-strips. Dressings were applied. The patient tolerated the procedure well without immediate post procedural complication. IMPRESSION: Successful placement of 19 cm tip to cuff tunneled hemodialysis catheter via the right internal jugular vein with tips terminating within the superior aspect of the right atrium. The catheter is ready for immediate use. Electronically Signed   By: [name] M.D.   On: 07/20/2016 17:12   Dg Chest Port 1 View  Result Date: 07/26/2016 CLINICAL DATA:  Pulmonary nodules EXAM: PORTABLE CHEST 1 VIEW COMPARISON:  06/19/2016, chest CT 06/20/2016 FINDINGS: Right dialysis catheter is in place with the tip at the cavoatrial junction. Postoperative changes on the left. Areas of scarring and nodularity in the left lung are stable. Decreasing airspace opacities in the right lung with some residual areas of nodularity. Heart is borderline in size. No visible effusions. IMPRESSION: Stable scarring and nodularity in the left lung with postoperative changes. Improving areas of nodularity in the right lung. Electronically Signed   By: Rolm Baptise M.D.   On: 07/26/2016 16:30   Ct Maxillofacial Wo Contrast  Result Date: 07/20/2016 CLINICAL DATA:  71 y/o  F; recurrent sinusitis. EXAM: CT MAXILLOFACIAL WITHOUT CONTRAST TECHNIQUE: Multidetector CT imaging of the maxillofacial structures was performed. Multiplanar CT image reconstructions were also generated. A small metallic BB was placed on the right temple in order to reliably differentiate right from left. COMPARISON:  03/13/2016 MRI head.   12/02/2015 CT head. FINDINGS: Frontal sinus:  Normally aerated. Patent frontal sinus drainage pathways. Ethmoid sinus: Normally aerated. Maxillary sinuses:  Normally aerated. Sphenoid sinus: Normally aerated. Patent sphenoid ethmoidal recesses. Right ostiomeatal unit:  Patent. Left ostiomeatal unit:  Patent. Nasal passages: Patent. Intact nasal septum. Mild leftward nasal septal deviation and small spur. Other: Orbits and intracranial compartment are unremarkable. Mastoid air cells are normally pneumatized. IMPRESSION: Normally aerated paranasal sinuses and patent sinus drainage pathways. Electronically Signed   By: Kristine Garbe M.D.   On: 07/20/2016 14:22    Time Spent in minutes  67   Lewanda Perea, MD, FACP, FHM. [facility] Pager [phone]  If 7PM-7AM, please contact night-coverage [email or web address] Password [facility] 08/10/2016, 5:54 PM

## 2016-08-11 LAB — RENAL FUNCTION PANEL
ALBUMIN: 3.6 g/dL (ref 3.5–5.0)
Anion gap: 10 (ref 5–15)
BUN: 22 mg/dL — AB (ref 6–20)
CO2: 27 mmol/L (ref 22–32)
CREATININE: 5.38 mg/dL — AB (ref 0.44–1.00)
Calcium: 8.6 mg/dL — ABNORMAL LOW (ref 8.9–10.3)
Chloride: 99 mmol/L — ABNORMAL LOW (ref 101–111)
GFR calc Af Amer: 8 mL/min — ABNORMAL LOW (ref >60)
GFR, EST NON AFRICAN AMERICAN: 7 mL/min — AB (ref >60)
GLUCOSE: 78 mg/dL (ref 65–99)
PHOSPHORUS: 2.3 mg/dL — AB (ref 2.5–4.6)
POTASSIUM: 4 mmol/L (ref 3.5–5.1)
Sodium: 136 mmol/L (ref 135–145)

## 2016-08-11 LAB — CBC
HEMATOCRIT: 34.1 % — AB (ref 36.0–46.0)
Hemoglobin: 11.1 g/dL — ABNORMAL LOW (ref 12.0–15.0)
MCH: 30.7 pg (ref 26.0–34.0)
MCHC: 32.6 g/dL (ref 30.0–36.0)
MCV: 94.5 fL (ref 78.0–100.0)
Platelets: 52 10*3/uL — ABNORMAL LOW (ref 150–400)
RBC: 3.61 MIL/uL — ABNORMAL LOW (ref 3.87–5.11)
RDW: 23.8 % — AB (ref 11.5–15.5)
WBC: 5 10*3/uL (ref 4.0–10.5)

## 2016-08-11 MED ORDER — SODIUM CHLORIDE 0.9 % IV SOLN: 100.0000 mL | INTRAVENOUS | Status: DC | PRN

## 2016-08-11 MED ORDER — HEPARIN SODIUM (PORCINE) 1000 UNIT/ML DIALYSIS: 1000.0000 [IU] | INTRAMUSCULAR | Status: DC | PRN

## 2016-08-11 MED ORDER — SORBITOL 70 % SOLN
60.0000 mL | Freq: Every day | Status: DC | PRN
  Filled 2016-08-11: qty 60

## 2016-08-11 MED ORDER — LIDOCAINE HCL (PF) 1 % IJ SOLN
5.0000 mL | INTRAMUSCULAR | Status: DC | PRN
Start: 2016-08-11 — End: 2016-08-11

## 2016-08-11 MED ORDER — ACETAMINOPHEN 325 MG PO TABS
ORAL_TABLET | ORAL | Status: AC
  Filled 2016-08-11: qty 2

## 2016-08-11 MED ORDER — PENTAFLUOROPROP-TETRAFLUOROETH EX AERO: 1.0000 "application " | INHALATION_SPRAY | CUTANEOUS | Status: DC | PRN

## 2016-08-11 MED ORDER — LIDOCAINE-PRILOCAINE 2.5-2.5 % EX CREA: 1.0000 "application " | TOPICAL_CREAM | CUTANEOUS | Status: DC | PRN

## 2016-08-11 MED ORDER — BISACODYL 10 MG RE SUPP: 10.0000 mg | Freq: Every day | RECTAL | Status: DC | PRN

## 2016-08-11 MED ORDER — NEPRO/CARBSTEADY PO LIQD: 237.0000 mL | Freq: Two times a day (BID) | ORAL | Status: DC

## 2016-08-11 MED ORDER — WHITE PETROLATUM GEL
Status: AC
  Administered 2016-08-11: 18:00:00
  Filled 2016-08-11: qty 1

## 2016-08-11 MED ORDER — ALTEPLASE 2 MG IJ SOLR: 2.0000 mg | Freq: Once | INTRAMUSCULAR | Status: DC | PRN

## 2016-08-11 MED ORDER — SENNA 8.6 MG PO TABS
2.0000 | ORAL_TABLET | Freq: Every day | ORAL | Status: DC
  Administered 2016-08-13: 17.2 mg via ORAL
  Filled 2016-08-11 (×3): qty 2

## 2016-08-11 MED ORDER — HEPARIN SODIUM (PORCINE) 1000 UNIT/ML DIALYSIS
40.0000 [IU]/kg | Freq: Once | INTRAMUSCULAR | Status: AC
  Administered 2016-08-11: 2000 [IU] via INTRAVENOUS_CENTRAL

## 2016-08-11 NOTE — Progress Notes (Signed)
PROGRESS NOTE                                                                                                                                                                                                             Patient Demographics:    [name], is a 71 y.o. female, DOB - Sep 29, 1945, QRF:[id number]  Admit date - 07/20/2016   Admitting Physician Waldemar Dickens, MD  Outpatient Primary MD for the patient is [facility], [place]  LOS - 22  Outpatient Specialists: NONE  No chief complaint on file.     Brief Narrative   71 y.o.femalewith medical history significant for COPD, esophageal stricture, GERD, hiatal hernia, hyperlipidemia, hypertension, IBS, cervical disc disease and history of lung nodules with extensive workup including lung biopsy ruling out Sjogren's disease. Pt has had recurrent history of sinusitis since last fall, treated with several rounds of antibiotics and corticosteroids which have not resulted in significant clinical improvement. She was hospitalized 2 months ago for fever/weight loss and relative hypotension with anorexia and night sweats and was treated for community-acquired pneumonia. She has continued to have sinus drainage with some epistaxis and bloody secretions and also had nausea with dysgeusia for the past few weeks or so. Pt also reported severe muscle pain and leg cramps over the same time period. Of note, pt is followed by Dr. Elsworth Soho for abnormal CT imaging &pulmonary nodules. Work up for this has included an open lung biopsy in 11/2008 in the setting of fever/night sweats and CT scan with pulmonary nodules which showed possible inflammatory pseudotumor vs reaction to old bronchopneumonia associated with some carcinoid tumorlets. Salivary  gland biospy 03/2009 was negative for Sjogren's disease and negative for autoimmune work up. Serial CT's were planned but she was lost to follow up.  She had a repeat CT of the chest in 06/2016 which showed volume loss, post surgical changes of the left hemithorax and RLL pulmonary nodules.   In regards to pt's creatinine, in January of 2018 was 1.7 and in February 2018 it ranged between 1.6 and 2.1 with a peak of 2.8 that prompted referral to nephrology. After evaluation 07/20/16, she was found to have an elevated creatinine level of 12.4 and given the high risk of suspicion for vasculitis pt was directly admitted to hospital for evaluation.  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 She was on solumedrol and then switched to PO prednisone. She is required to have plasmapheresis on alternate days for 2 weeks (started 07/22/2016).   Subjective:   Patient was seen this morning across dialysis. Denied complaints. No chest pain or dyspnea.   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 Renal started on Cyclophosphamide 100 mg PO QD.  - She has outpt HD established ,  TTS. ( has spot at [facility]) - Nephrology managing no plans to start cytotoxan at this juncture - Vascular on board and access surgery that was planned for 4/6 has been canceled until thrombocytopenia is better. - repeat anti GBM of 34.   Essential hypertension - Continue Norvasc . Controlled  Anemia of chronic renal disease -Aranesp when necessary with  dialysis. Stable.  Hyperphosphatemia - Low phosphorus diet, monitor with hemodialysis - PTH level 109  Thrombocytopenia Platelet counts have improved to 52 on 4/5. Continue to trend.  Leukocytosis Resolved.   Dyslipidemia - Continue Lipitor   RLL pulmonary nodules - Pulm recommended prophylactic bactrim while on immunosuppresion - Repeat CT imaging done this admission shows unchanged pulmonary nodule. If comparative CT in 2 months unchanged and no evidence of renal recover, doubt that there is benefit of continued therapy. - Monitor for hemoptysis - Follow up with Dr. Elsworth Soho at discharge >had prior PET arranged for May 2018 to review pulmonary nodules.  Constipation Intermittent. Continue bowel regimen.  Code Status : Full code  Family Communication  : None at bedside  Disposition Plan  : DC home when cleared by nephrology pending dialysis access.  Barriers For Discharge : D/c After access, vein mapping in planned.  Consults  :   Nephrology Pulmonary IR    Procedures    RIJ cathter for HD placed 07/20/2016  Renal biopsy 07/21/2016  Plasmapheresis 07/22/16  HD TTS  Cytoxan - started 07/27/16  DVT Prophylaxis  :  SCDs ( no heparin for 1 month post renal bx)  Lab Results  Component Value Date   PLT 52 (L) 08/11/2016    Antibiotics  :    Anti-infectives    Start     Dose/Rate Route Frequency Ordered Stop   07/27/16 0900  sulfamethoxazole-trimethoprim (BACTRIM DS,SEPTRA DS) 800-160 MG per tablet 1 tablet     1 tablet Oral Once per day on Mon Wed Fri 07/26/16 1639     07/20/16 1430  ceFAZolin (ANCEF) IVPB 2g/100 mL premix     2 g 200 mL/hr over 30 Minutes Intravenous To Radiology 07/20/16 1424 07/20/16 1641        Objective:   Vitals:   08/11/16 1100 08/11/16 1130 08/11/16 1200 08/11/16 1230  BP: (!) 146/82 (!) 143/70 131/76 (!) 158/69  Pulse: 66 72 67 75  Resp:    16  Temp:    98.2 F (36.8 C)  TempSrc:    Oral  SpO2:    99%  Weight:    49  kg (108 lb 0.4 oz)  Height:        Wt Readings from Last 3 Encounters:  08/11/16 49 kg (108 lb 0.4 oz)  06/29/16 65.8 kg (145 lb)  06/19/16 64 kg (141 lb)     Intake/Output Summary (Last 24 hours) at 08/11/16 1430 Last data filed at 08/11/16 1230  Gross per 24 hour  Intake              300 ml  Output             1335 ml  Net            -1035 ml    Physical Exam  Gen: Pleasant elderly female lying comfortably supine in bed undergoing dialysis this morning. HEENT: moist mucosa,  rt temp HD catheter, atraumatic, normocephalic Chest: clear b/l, no wheezes, equal chest rise. No increased work of breathing. CVS: N S1&S2, no murmurs, no pedal edema or JVD. Not on telemetry. GI: soft, NT, ND, Musculoskeletal: warm, no edema CNS: Alert and oriented. No focal neurological deficits.     Data Review:    CBC  Recent Labs Lab 08/08/16 0648 08/09/16 0513 08/09/16 2131 08/10/16 0654 08/11/16 0531  WBC 5.4 4.0 5.3 3.8* 5.0  HGB 10.4* 10.0* 12.5 11.2* 11.1*  HCT 31.7* 30.1* 37.9 34.1* 34.1*  PLT 53* 39* 44* 41* 52*  MCV 91.9 93.2 94.3 94.5 94.5  MCH 30.1 31.0 31.1 31.0 30.7  MCHC 32.8 33.2 33.0 32.8 32.6  RDW 21.6* 23.0* 23.9* 24.0* 23.8*    Chemistries   Recent Labs Lab 08/08/16 0648 08/09/16 0513 08/09/16 2131 08/10/16 0654 08/11/16 0531  NA 138 136 136 136 136  K 3.7 3.4* 5.1 4.9 4.0  CL 101 101 99* 100* 99*  CO2 '28 24 26 25 27  ' GLUCOSE 108* 79 115* 102* 78  BUN 30* 41* 9 13 22*  CREATININE 5.32* 6.63* 3.00* 3.73* 5.38*  CALCIUM 8.5* 8.0* 8.0* 8.0* 8.6*     Lab Results  Component Value Date   HGBA1C 5.8 02/02/2010    Inpatient Medications  Scheduled Meds: . sodium chloride   Intravenous Once  . sodium chloride   Intravenous Once  . acetaminophen      . amLODipine  5 mg Oral QHS  . aspirin EC  81 mg Oral Daily  . atorvastatin  10 mg Oral q1800  . bisacodyl  10 mg Rectal Daily  . darbepoetin (ARANESP) injection - DIALYSIS  150 mcg Intravenous Q  Sat-HD  . feeding supplement  1 Container Oral BID BM  . ferric gluconate (FERRLECIT/NULECIT) IV  125 mg Intravenous Q T,Th,Sa-HD  . fluticasone  2 spray Each Nare QHS  . hydrocortisone   Rectal TID  . loratadine  10 mg Oral Daily  . multivitamin  1 tablet Oral QHS  . pantoprazole  40 mg Oral Daily  . polyethylene glycol  17 g Oral Daily  . predniSONE  20 mg Oral Q breakfast  . sodium chloride  2 spray Each Nare TID  . sodium chloride flush  3 mL Intravenous Q12H  . sulfamethoxazole-trimethoprim  1 tablet Oral Once per day on Mon Wed Fri   Continuous Infusions:  PRN Meds:.sodium chloride, acetaminophen, calcium carbonate, methocarbamol (ROBAXIN)  IV, ondansetron **OR** ondansetron (ZOFRAN) IV, senna, sodium chloride flush, sorbitol, zolpidem  Micro Results No results found for this or any previous visit (from the past 240 hour(s)).  Radiology Reports Ct Chest Wo Contrast  Result Date: 07/29/2016 CLINICAL DATA:  71 year old female inpatient admitted for 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 Longstanding history of indeterminate pulmonary nodules, described as inflammatory pseudotumors on surgical lung biopsy of the left lung on 11/06/2008. EXAM: CT CHEST WITHOUT CONTRAST TECHNIQUE: Multidetector CT imaging of the chest was performed following the standard protocol without IV contrast. COMPARISON:  06/20/2016 chest CT.  07/26/2016 chest radiograph. FINDINGS: Cardiovascular: Top-normal heart size . No significant pericardial fluid/thickening. Right internal jugular dialysis catheter terminates at the cavoatrial junction. Atherosclerotic nonaneurysmal thoracic aorta. Stable dilated pulmonary arteries (main pulmonary artery diameter 3.4 cm). Mediastinum/Nodes: Stable heterogeneous thyroid gland with possible confluent small hypodense thyroid nodules. 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 No pathologically enlarged axillary, mediastinal or gross hilar lymph nodes, noting limited sensitivity for the detection of hilar adenopathy on this noncontrast study. Lungs/Pleura: No pneumothorax. New trace dependent bilateral pleural effusions. Stable postsurgical changes from wedge resection in the left upper and left lower lobes. Mild centrilobular and paraseptal emphysema with mild diffuse bronchial wall thickening. No acute consolidative airspace disease or new significant pulmonary nodules. Stable parenchymal band in the posterior right upper lobe with associated distortion, compatible with postinfectious/postinflammatory scarring. Stable chronic thickening of the peribronchovascular interstitium throughout both lungs with associated chronic mild distortion. Scattered irregular pulmonary nodules in the right lung are unchanged in the interval and not convincingly changed back to 12/04/2009 chest CT, compatible with benign inflammatory nodules. For example a 1.5 x 1.0 cm peripheral right upper lobe nodule (series 4/ image 41), measuring 1.5 x 1.0 cm on 06/20/2016 and 1.5 x 0.9 cm on 12/04/2009. Partially cystic irregular right lower lobe 2.2 x 1.7 cm nodule, measuring 2.3 x 1.8 cm on 06/20/2016 and 2.3 x 1.9 cm on 12/04/2009. Upper abdomen: Unremarkable. Musculoskeletal: No aggressive appearing focal osseous lesions. Mild thoracic spondylosis. New mild anasarca. IMPRESSION: 1. New mild anasarca and trace dependent bilateral pleural effusions. Top-normal heart size. 2. Scattered irregular pulmonary nodules in the right lung are stable in the interval and not convincingly changed back to 2011, compatible with benign inflammatory nodules given the results of the 2010 surgical left lung biopsy. 3. Stable postsurgical and postinflammatory scarring in both lungs. No acute pulmonary disease. 4. Mild emphysema with mild diffuse bronchial wall thickening, suggesting COPD. 5. Aortic  atherosclerosis. 6. Stable dilated main pulmonary artery, suggesting pulmonary arterial hypertension. Electronically Signed   By: Ilona Sorrel M.D.   On: 07/29/2016 15:48   US Biopsy  Result Date: 07/21/2016 INDICATION: Acute renal failure EXAM: ULTRASOUND-GUIDED BIOPSY OF THE RENAL CORTEX.  RANDOM CORE. MEDICATIONS: None. ANESTHESIA/SEDATION: Fentanyl 50 mcg IV; Versed 1 mg IV Moderate Sedation Time:  10 The patient was continuously monitored during the procedure by the interventional radiology nurse under my direct supervision. FLUOROSCOPY TIME:  None. COMPLICATIONS: None immediate. PROCEDURE: Informed written consent was obtained from the patient after a thorough discussion of the procedural risks, benefits and alternatives. All questions were addressed. Maximal Sterile Barrier Technique was utilized including caps, mask, sterile gowns, sterile gloves, sterile drape, hand hygiene and skin antiseptic. A timeout was performed prior to the initiation of the procedure. The back was prepped with ChloraPrep in a sterile fashion, and a sterile drape was applied covering the operative field. A sterile gown and sterile gloves were used for the procedure. Under sonographic guidance, 2 16 gauge core biopsies of the cortex of the lower  pole of the left kidney were obtained. Final imaging was performed. Patient tolerated the procedure well without complication. Vital sign monitoring by nursing staff during the procedure will continue as patient is in the special procedures unit for post procedure observation. FINDINGS: The images document guide needle placement within the left lower pole renal cortex. Post biopsy images demonstrate no evidence of hemorrhage. IMPRESSION: Successful ultrasound-guided random renal cortex core biopsy. Electronically Signed   By: Marybelle Killings M.D.   On: 07/21/2016 14:36   Ir Fluoro Guide Cv Line Right  Result Date: 07/20/2016 INDICATION: End-stage renal disease. In need of durable  intravenous access for the initiation of dialysis. EXAM: TUNNELED CENTRAL VENOUS HEMODIALYSIS CATHETER PLACEMENT WITH ULTRASOUND AND FLUOROSCOPIC GUIDANCE MEDICATIONS: Ancef 2 gm IV . The antibiotic was given in an appropriate time interval prior to skin puncture. ANESTHESIA/SEDATION: Versed 1 mg IV; Fentanyl 50 mcg IV; Moderate Sedation Time:  17 minutes The patient was continuously monitored during the procedure by the interventional radiology nurse under my direct supervision. FLUOROSCOPY TIME:  Fluoroscopy Time: 12 seconds (2 mGy). COMPLICATIONS: None immediate. PROCEDURE: Informed written consent was obtained from the patient after a discussion of the risks, benefits, and alternatives to treatment. Questions regarding the procedure were encouraged and answered. The right neck and chest were prepped with chlorhexidine in a sterile fashion, and a sterile drape was applied covering the operative field. Maximum barrier sterile technique with sterile gowns and gloves were used for the procedure. A timeout was performed prior to the initiation of the procedure. After creating a small venotomy incision, a micropuncture kit was utilized to access the right internal jugular vein under direct, real-time ultrasound guidance after the overlying soft tissues were anesthetized with 1% lidocaine with epinephrine. Ultrasound image documentation was performed. The microwire was kinked to measure appropriate catheter length. A stiff Glidewire was advanced to the level of the IVC and the micropuncture sheath was exchanged for a peel-away sheath. A Palindrome tunneled hemodialysis catheter measuring 19 cm from tip to cuff was tunneled in a retrograde fashion from the anterior chest wall to the venotomy incision. The catheter was then placed through the peel-away sheath with tips ultimately positioned within the superior aspect of the right atrium. Final catheter positioning was confirmed and documented with a spot radiographic  image. The catheter aspirates and flushes normally. The catheter was flushed with appropriate volume heparin dwells. The catheter exit site was secured with a 0-Prolene retention suture. The venotomy incision was closed with an interrupted 4-0 Vicryl, Dermabond and Steri-strips. Dressings were applied. The patient tolerated the procedure well without immediate post procedural complication. IMPRESSION: Successful placement of 19 cm tip to cuff tunneled hemodialysis catheter via the right internal jugular vein with tips terminating within the superior aspect of the right atrium. The catheter is ready for immediate use. Electronically Signed   By: [name] M.D.   On: 07/20/2016 17:12   Ir US Guide Vasc Access Right  Result Date: 07/20/2016 INDICATION: End-stage renal disease. In need of durable intravenous access for the initiation of dialysis. EXAM: TUNNELED CENTRAL VENOUS HEMODIALYSIS CATHETER PLACEMENT WITH ULTRASOUND AND FLUOROSCOPIC GUIDANCE MEDICATIONS: Ancef 2 gm IV . The antibiotic was given in an appropriate time interval prior to skin puncture. ANESTHESIA/SEDATION: Versed 1 mg IV; Fentanyl 50 mcg IV; Moderate Sedation Time:  17 minutes The patient was continuously monitored during the procedure by the interventional radiology nurse under my direct supervision. FLUOROSCOPY TIME:  Fluoroscopy Time: 12 seconds (2 mGy). COMPLICATIONS: None immediate.  PROCEDURE: Informed written consent was obtained from the patient after a discussion of the risks, benefits, and alternatives to treatment. Questions regarding the procedure were encouraged and answered. The right neck and chest were prepped with chlorhexidine in a sterile fashion, and a sterile drape was applied covering the operative field. Maximum barrier sterile technique with sterile gowns and gloves were used for the procedure. A timeout was performed prior to the initiation of the procedure. After creating a small venotomy incision, a micropuncture kit  was utilized to access the right internal jugular vein under direct, real-time ultrasound guidance after the overlying soft tissues were anesthetized with 1% lidocaine with epinephrine. Ultrasound image documentation was performed. The microwire was kinked to measure appropriate catheter length. A stiff Glidewire was advanced to the level of the IVC and the micropuncture sheath was exchanged for a peel-away sheath. A Palindrome tunneled hemodialysis catheter measuring 19 cm from tip to cuff was tunneled in a retrograde fashion from the anterior chest wall to the venotomy incision. The catheter was then placed through the peel-away sheath with tips ultimately positioned within the superior aspect of the right atrium. Final catheter positioning was confirmed and documented with a spot radiographic image. The catheter aspirates and flushes normally. The catheter was flushed with appropriate volume heparin dwells. The catheter exit site was secured with a 0-Prolene retention suture. The venotomy incision was closed with an interrupted 4-0 Vicryl, Dermabond and Steri-strips. Dressings were applied. The patient tolerated the procedure well without immediate post procedural complication. IMPRESSION: Successful placement of 19 cm tip to cuff tunneled hemodialysis catheter via the right internal jugular vein with tips terminating within the superior aspect of the right atrium. The catheter is ready for immediate use. Electronically Signed   By: [name] M.D.   On: 07/20/2016 17:12   Dg Chest Port 1 View  Result Date: 07/26/2016 CLINICAL DATA:  Pulmonary nodules EXAM: PORTABLE CHEST 1 VIEW COMPARISON:  06/19/2016, chest CT 06/20/2016 FINDINGS: Right dialysis catheter is in place with the tip at the cavoatrial junction. Postoperative changes on the left. Areas of scarring and nodularity in the left lung are stable. Decreasing airspace opacities in the right lung with some residual areas of nodularity. Heart is  borderline in size. No visible effusions. IMPRESSION: Stable scarring and nodularity in the left lung with postoperative changes. Improving areas of nodularity in the right lung. Electronically Signed   By: Rolm Baptise M.D.   On: 07/26/2016 16:30   Ct Maxillofacial Wo Contrast  Result Date: 07/20/2016 CLINICAL DATA:  71 y/o  F; recurrent sinusitis. EXAM: CT MAXILLOFACIAL WITHOUT CONTRAST TECHNIQUE: Multidetector CT imaging of the maxillofacial structures was performed. Multiplanar CT image reconstructions were also generated. A small metallic BB was placed on the right temple in order to reliably differentiate right from left. COMPARISON:  03/13/2016 MRI head.  12/02/2015 CT head. FINDINGS: Frontal sinus: Normally aerated. Patent frontal sinus drainage pathways. Ethmoid sinus: Normally aerated. Maxillary sinuses:  Normally aerated. Sphenoid sinus: Normally aerated. Patent sphenoid ethmoidal recesses. Right ostiomeatal unit:  Patent. Left ostiomeatal unit:  Patent. Nasal passages: Patent. Intact nasal septum. Mild leftward nasal septal deviation and small spur. Other: Orbits and intracranial compartment are unremarkable. Mastoid air cells are normally pneumatized. IMPRESSION: Normally aerated paranasal sinuses and patent sinus drainage pathways. Electronically Signed   By: Kristine Garbe M.D.   On: 07/20/2016 14:22    Time Spent in minutes  47   HONGALGI,ANAND, MD, FACP, FHM. [facility] Pager [phone]  If  7PM-7AM, please contact night-coverage [email or web address] Password [email or web address] 08/11/2016, 2:30 PM

## 2016-08-11 NOTE — Progress Notes (Signed)
Nutrition Follow-up  DOCUMENTATION CODES:   Not applicable  INTERVENTION:    Nepro Shake po BID, each supplement provides 425 kcal and 19 grams protein  NUTRITION DIAGNOSIS:   Increased nutrient needs related to chronic illness as evidenced by estimated needs; ongoing  GOAL:   Patient will meet greater than or equal to 90% of their needs; progressing  MONITOR:   PO intake, Supplement acceptance, Labs, Weight trends, Skin, I & O's  ASSESSMENT:   71 y.o. female with medical history significant for COPD, esophageal stricture, GERD, hiatal hernia, hyperlipidemia, hypertension, IBS, cervical disc disease and history of lung nodules with extensive workup including lung biopsy ruling out Sjogren's disease. She was hospitalized 2 months ago for fever/weight loss and relative hypotension with anorexia and night sweats and was treated for community-acquired pneumonia. She has continued to have sinus drainage with some epistaxis and bloody secretions and also had nausea with dysgeusia for the past few weeks or so. 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 Pt also on plasmapheresis on alternate days for 2 weeks (last TPE 3/26).  Pt continues on a Renal with 1200 ml fluid restriction diet. PO intake variable at 0-100% per flowsheets. Refusing her Boost Breeze nutrition supplements.   Medications reviewed and include RENA-VIT daily.  Constipation resolved.  On [place].  Diet Order:  Diet renal with fluid restriction Fluid restriction: 1200 mL Fluid; Room service appropriate? Yes; Fluid consistency: Thin  Skin:  Reviewed, no issues  Last BM:  4/4  Height:   Ht Readings from Last 1 Encounters:  07/22/16 5\' 3"  (1.6 m)   Weight:   Wt Readings from Last 1 Encounters:  08/11/16 108 lb 0.4 oz (49 kg)    Ideal Body Weight:   52.27 kg  BMI:  Body mass index is 19.14 kg/m.  Estimated Nutritional Needs:   Kcal:  1700-1900  Protein:  75-85 grams  Fluid:  1.2 L/day  EDUCATION NEEDS:   No education needs identified at this time  Arthur Holms, RD, LDN Pager #: [phone] After-Hours Pager #: [phone]

## 2016-08-11 NOTE — Progress Notes (Signed)
Patient ID: [name], female   DOB: 1946-02-15, 71 y.o.   MRN: [id number] Platelet count today 52,000. We'll cancel surgery for tomorrow. Has tunneled catheter for intermediate term hemodialysis. Will plan permanent access next week if she is still an inpatient or schedule as an outpatient. Following from the side lines.

## 2016-08-11 NOTE — Progress Notes (Signed)
Subjective: Interval History: has complaints constip.  Objective: Vital signs in last 24 hours: Temp:  [98 F (36.7 C)-98.9 F (37.2 C)] 98 F (36.7 C) (04/05 0815) Pulse Rate:  [72-84] 76 (04/05 0900) Resp:  [17-18] 18 (04/05 0815) BP: (124-153)/(71-111) 149/76 (04/05 0900) SpO2:  [99 %-100 %] 99 % (04/05 0815) Weight:  [50.8 kg (111 lb 15.9 oz)-53.5 kg (118 lb)] 50.8 kg (111 lb 15.9 oz) (04/05 0815) Weight change: 0.924 kg (2 lb 0.6 oz)  Intake/Output from previous day: 04/04 0701 - 04/05 0700 In: 780 [P.O.:780] Out: 0  Intake/Output this shift: No intake/output data recorded.  General appearance: alert, cooperative and no distress Resp: diminished breath sounds bilaterally Chest wall: R IJ PC Cardio: S1, S2 normal and systolic murmur: systolic ejection 2/6, decrescendo at 2nd left intercostal space GI: pos bs,soft, Extremities: extremities normal, atraumatic, no cyanosis or edema  Lab Results:  Recent Labs  08/10/16 0654 08/11/16 0531  WBC 3.8* 5.0  HGB 11.2* 11.1*  HCT 34.1* 34.1*  PLT 41* 52*   BMET:  Recent Labs  08/10/16 0654 08/11/16 0531  NA 136 136  K 4.9 4.0  CL 100* 99*  CO2 25 27  GLUCOSE 102* 78  BUN 13 22*  CREATININE 3.73* 5.38*  CALCIUM 8.0* 8.6*   No results for input(s): PTH in the last 72 hours. Iron Studies: No results for input(s): IRON, TIBC, TRANSFERRIN, FERRITIN in the last 72 hours.  Studies/Results: No results found.  I have reviewed the patient's current medications.  Assessment/Plan: 1 ESRD for Hd, vol and bp ok. 2Anemia esa/Fe 3 HPTHvit D 4 Low ptlt  CTX residual.  Will discuss with Dr. Joelyn Oms 5 AntiGBM/ANCA P Hd, esa, follow ptlt    LOS: 22 days   Theadore Blunck L 08/11/2016,10:14 AM

## 2016-08-12 ENCOUNTER — Encounter (HOSPITAL_COMMUNITY)
Admission: AD | Disposition: A | Discharge status: Home Health | Payer: Self-pay | Source: Ambulatory Visit | Attending: Family Medicine

## 2016-08-12 LAB — RENAL FUNCTION PANEL
Albumin: 3.6 g/dL (ref 3.5–5.0)
Anion gap: 12 (ref 5–15)
BUN: 11 mg/dL (ref 6–20)
CHLORIDE: 97 mmol/L — AB (ref 101–111)
CO2: 26 mmol/L (ref 22–32)
CREATININE: 3.7 mg/dL — AB (ref 0.44–1.00)
Calcium: 8 mg/dL — ABNORMAL LOW (ref 8.9–10.3)
GFR, EST AFRICAN AMERICAN: 13 mL/min — AB (ref >60)
GFR, EST NON AFRICAN AMERICAN: 11 mL/min — AB (ref >60)
Glucose, Bld: 98 mg/dL (ref 65–99)
POTASSIUM: 3.7 mmol/L (ref 3.5–5.1)
Phosphorus: 2.7 mg/dL (ref 2.5–4.6)
Sodium: 135 mmol/L (ref 135–145)

## 2016-08-12 LAB — CBC
HCT: 36.5 % (ref 36.0–46.0)
Hemoglobin: 11.7 g/dL — ABNORMAL LOW (ref 12.0–15.0)
MCH: 30.3 pg (ref 26.0–34.0)
MCHC: 32.1 g/dL (ref 30.0–36.0)
MCV: 94.6 fL (ref 78.0–100.0)
PLATELETS: 53 10*3/uL — AB (ref 150–400)
RBC: 3.86 MIL/uL — AB (ref 3.87–5.11)
RDW: 23.9 % — ABNORMAL HIGH (ref 11.5–15.5)
WBC: 4.3 10*3/uL (ref 4.0–10.5)

## 2016-08-12 SURGERY — ARTERIOVENOUS (AV) FISTULA CREATION
Anesthesia: Monitor Anesthesia Care | Laterality: Left

## 2016-08-12 MED ORDER — DARBEPOETIN ALFA 60 MCG/0.3ML IJ SOSY
60.0000 ug | PREFILLED_SYRINGE | INTRAMUSCULAR | Status: DC
  Administered 2016-08-13: 60 ug via INTRAVENOUS
  Filled 2016-08-12: qty 0.3

## 2016-08-12 NOTE — Progress Notes (Signed)
PROGRESS NOTE                                                                                                                                                                                                             Patient Demographics:    [name], is a 71 y.o. female, DOB - Jan 15, 1946, NFA:[id number]  Admit date - 07/20/2016   Admitting Physician Waldemar Dickens, MD  Outpatient Primary MD for the patient is [facility], MD  LOS - 23  Outpatient Specialists: NONE  No chief complaint on file.     Brief Narrative   71 y.o.femalewith medical history significant for COPD, esophageal stricture, GERD, hiatal hernia, hyperlipidemia, hypertension, IBS, cervical disc disease and history of lung nodules with extensive workup including lung biopsy ruling out Sjogren's disease. Pt has had recurrent history of sinusitis since last fall, treated with several rounds of antibiotics and corticosteroids which have not resulted in significant clinical improvement. She was hospitalized 2 months ago for fever/weight loss and relative hypotension with anorexia and night sweats and was treated for community-acquired pneumonia. She has continued to have sinus drainage with some epistaxis and bloody secretions and also had nausea with dysgeusia for the past few weeks or so. Pt also reported severe muscle pain and leg cramps over the same time period. Of note, pt is followed by Dr. Elsworth Soho for abnormal CT imaging &pulmonary nodules. Work up for this has included an open lung biopsy in 11/2008 in the setting of fever/night sweats and CT scan with pulmonary nodules which showed possible inflammatory pseudotumor vs reaction to old bronchopneumonia associated with some carcinoid tumorlets. Salivary  gland biospy 03/2009 was negative for Sjogren's disease and negative for autoimmune work up. Serial CT's were planned but she was lost to follow up.  She had a repeat CT of the chest in 06/2016 which showed volume loss, post surgical changes of the left hemithorax and RLL pulmonary nodules.   In regards to pt's creatinine, in January of 2018 was 1.7 and in February 2018 it ranged between 1.6 and 2.1 with a peak of 2.8 that prompted referral to nephrology. After evaluation 07/20/16, she was found to have an elevated creatinine level of 12.4 and given the high risk of suspicion for vasculitis pt was directly admitted to hospital for evaluation.  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 She was on solumedrol and then switched to PO prednisone. She is required to have plasmapheresis on alternate days for 2 weeks (started 07/22/2016).   Subjective:   No complaints reported. Aware of that her fistula procedure was postponed and was hoping that this could have been done in the hospital.   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 Renal started on Cyclophosphamide 100 mg PO QD.  - She has outpt HD established ,  TTS. ( has spot at [facility]) - Nephrology managing no plans to start cytotoxan at this juncture - Vascular on board and access surgery that was planned for 4/6 has been canceled until thrombocytopenia is better. - repeat anti GBM of 34.  - Discussed with Dr. Jimmy Footman: Okay to discharge once transport arrangements from home to outpatient HD  have been arranged by social work, outpatient follow-up with Dr. Joelyn Oms who will coordinate AV fistula placement.  Essential hypertension - Continue Norvasc . Controlled  Anemia of chronic renal disease -Aranesp when necessary with dialysis. Stable.  Hyperphosphatemia - Low phosphorus diet, monitor with hemodialysis - PTH level 109  Thrombocytopenia Platelet counts have improved to 52 on 4/5. Continue to trend.  Leukocytosis Resolved.   Dyslipidemia - Continue Lipitor   RLL pulmonary nodules - Pulm recommended prophylactic bactrim while on immunosuppresion - Repeat CT imaging done this admission shows unchanged pulmonary nodule. If comparative CT in 2 months unchanged and no evidence of renal recover, doubt that there is benefit of continued therapy. - Monitor for hemoptysis - Follow up with Dr. Elsworth Soho at discharge >had prior PET arranged for May 2018 to review pulmonary nodules.  Constipation Intermittent. Continue bowel regimen.  Code Status : Full code  Family Communication  : None at bedside  Disposition Plan  : DC home possibly 08/13/16 after dialysis. Family to assist with transportation initially until SCAT arranged.  Barriers For Discharge : D/c After access, vein mapping in planned.  Consults  :   Nephrology Pulmonary IR    Procedures    RIJ cathter for HD placed 07/20/2016  Renal biopsy 07/21/2016  Plasmapheresis 07/22/16  HD TTS  Cytoxan - started 07/27/16  DVT Prophylaxis  :  SCDs ( no heparin for 1 month post renal bx)  Lab Results  Component Value Date   PLT 53 (L) 08/12/2016    Antibiotics  :    Anti-infectives    Start     Dose/Rate Route Frequency Ordered Stop   07/27/16 0900  sulfamethoxazole-trimethoprim (BACTRIM DS,SEPTRA DS) 800-160 MG per tablet 1 tablet     1 tablet Oral Once per day on Mon Wed Fri 07/26/16 1639     07/20/16 1430  ceFAZolin (ANCEF) IVPB 2g/100 mL premix     2 g 200 mL/hr over 30 Minutes Intravenous To  Radiology 07/20/16 1424 07/20/16 1641        Objective:   Vitals:   08/11/16 2217  08/12/16 0540 08/12/16 0816 08/12/16 1638  BP:  (!) 147/87 (!) 149/75 (!) 141/79  Pulse:  83 74 85  Resp:  '16 16 16  ' Temp:  98.5 F (36.9 C) 98.9 F (37.2 C) 98.9 F (37.2 C)  TempSrc:  Oral Oral Oral  SpO2:  100% 100% 100%  Weight: 48.7 kg (107 lb 6.4 oz)     Height:        Wt Readings from Last 3 Encounters:  08/11/16 48.7 kg (107 lb 6.4 oz)  06/29/16 65.8 kg (145 lb)  06/19/16 64 kg (141 lb)     Intake/Output Summary (Last 24 hours) at 08/12/16 1705 Last data filed at 08/12/16 1451  Gross per 24 hour  Intake              960 ml  Output              150 ml  Net              810 ml    Physical Exam  Gen: Pleasant elderly female ambulating comfortably in the room. HEENT: moist mucosa,  rt temp HD catheter, atraumatic, normocephalic Chest: clear b/l, no wheezes, equal chest rise. No increased work of breathing. CVS: N S1&S2, no murmurs, no pedal edema or JVD. Not on telemetry. GI: soft, NT, ND, Musculoskeletal: warm, no edema CNS: Alert and oriented. No focal neurological deficits.     Data Review:    CBC  Recent Labs Lab 08/09/16 0513 08/09/16 2131 08/10/16 0654 08/11/16 0531 08/12/16 0510  WBC 4.0 5.3 3.8* 5.0 4.3  HGB 10.0* 12.5 11.2* 11.1* 11.7*  HCT 30.1* 37.9 34.1* 34.1* 36.5  PLT 39* 44* 41* 52* 53*  MCV 93.2 94.3 94.5 94.5 94.6  MCH 31.0 31.1 31.0 30.7 30.3  MCHC 33.2 33.0 32.8 32.6 32.1  RDW 23.0* 23.9* 24.0* 23.8* 23.9*    Chemistries   Recent Labs Lab 08/09/16 0513 08/09/16 2131 08/10/16 0654 08/11/16 0531 08/12/16 0510  NA 136 136 136 136 135  K 3.4* 5.1 4.9 4.0 3.7  CL 101 99* 100* 99* 97*  CO2 '24 26 25 27 26  ' GLUCOSE 79 115* 102* 78 98  BUN 41* 9 13 22* 11  CREATININE 6.63* 3.00* 3.73* 5.38* 3.70*  CALCIUM 8.0* 8.0* 8.0* 8.6* 8.0*     Lab Results  Component Value Date   HGBA1C 5.8 02/02/2010    Inpatient Medications  Scheduled  Meds: . sodium chloride   Intravenous Once  . sodium chloride   Intravenous Once  . amLODipine  5 mg Oral QHS  . aspirin EC  81 mg Oral Daily  . atorvastatin  10 mg Oral q1800  . [START ON 08/13/2016] darbepoetin (ARANESP) injection - DIALYSIS  60 mcg Intravenous Q Sat-HD  . feeding supplement (NEPRO CARB STEADY)  237 mL Oral BID BM  . ferric gluconate (FERRLECIT/NULECIT) IV  125 mg Intravenous Q T,Th,Sa-HD  . fluticasone  2 spray Each Nare QHS  . hydrocortisone   Rectal TID  . loratadine  10 mg Oral Daily  . multivitamin  1 tablet Oral QHS  . pantoprazole  40 mg Oral Daily  . polyethylene glycol  17 g Oral Daily  . predniSONE  20 mg Oral Q breakfast  . senna  2 tablet Oral Daily  . sodium chloride  2 spray Each Nare TID  . sodium chloride flush  3 mL Intravenous Q12H  . sulfamethoxazole-trimethoprim  1 tablet Oral Once per day on Mon Wed  Fri   Continuous Infusions:  PRN Meds:.sodium chloride, acetaminophen, bisacodyl, calcium carbonate, methocarbamol (ROBAXIN)  IV, ondansetron **OR** ondansetron (ZOFRAN) IV, sodium chloride flush, sorbitol, zolpidem  Micro Results No results found for this or any previous visit (from the past 240 hour(s)).  Radiology Reports Ct Chest Wo Contrast  Result Date: 07/29/2016 CLINICAL DATA:  71 year old female inpatient admitted for 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 Longstanding history of indeterminate pulmonary nodules, described as inflammatory pseudotumors on surgical lung biopsy of the left lung on 11/06/2008. EXAM: CT CHEST WITHOUT CONTRAST TECHNIQUE: Multidetector CT imaging of the chest was performed following the standard protocol without IV contrast. COMPARISON:  06/20/2016 chest CT.  07/26/2016 chest radiograph. FINDINGS: Cardiovascular: Top-normal heart size . No significant pericardial fluid/thickening. Right internal jugular dialysis catheter terminates at the cavoatrial junction. Atherosclerotic  nonaneurysmal thoracic aorta. Stable dilated pulmonary arteries (main pulmonary artery diameter 3.4 cm). Mediastinum/Nodes: Stable heterogeneous thyroid gland with possible confluent small hypodense thyroid nodules. 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 No pathologically enlarged axillary, mediastinal or gross hilar lymph nodes, noting limited sensitivity for the detection of hilar adenopathy on this noncontrast study. Lungs/Pleura: No pneumothorax. New trace dependent bilateral pleural effusions. Stable postsurgical changes from wedge resection in the left upper and left lower lobes. Mild centrilobular and paraseptal emphysema with mild diffuse bronchial wall thickening. No acute consolidative airspace disease or new significant pulmonary nodules. Stable parenchymal band in the posterior right upper lobe with associated distortion, compatible with postinfectious/postinflammatory scarring. Stable chronic thickening of the peribronchovascular interstitium throughout both lungs with associated chronic mild distortion. Scattered irregular pulmonary nodules in the right lung are unchanged in the interval and not convincingly changed back to 12/04/2009 chest CT, compatible with benign inflammatory nodules. For example a 1.5 x 1.0 cm peripheral right upper lobe nodule (series 4/ image 41), measuring 1.5 x 1.0 cm on 06/20/2016 and 1.5 x 0.9 cm on 12/04/2009. Partially cystic irregular right lower lobe 2.2 x 1.7 cm nodule, measuring 2.3 x 1.8 cm on 06/20/2016 and 2.3 x 1.9 cm on 12/04/2009. Upper abdomen: Unremarkable. Musculoskeletal: No aggressive appearing focal osseous lesions. Mild thoracic spondylosis. New mild anasarca. IMPRESSION: 1. New mild anasarca and trace dependent bilateral pleural effusions. Top-normal heart size. 2. Scattered irregular pulmonary nodules in the right lung are stable in the interval and not  convincingly changed back to 2011, compatible with benign inflammatory nodules given the results of the 2010 surgical left lung biopsy. 3. Stable postsurgical and postinflammatory scarring in both lungs. No acute pulmonary disease. 4. Mild emphysema with mild diffuse bronchial wall thickening, suggesting COPD. 5. Aortic atherosclerosis. 6. Stable dilated main pulmonary artery, suggesting pulmonary arterial hypertension. Electronically Signed   By: Ilona Sorrel M.D.   On: 07/29/2016 15:48   US Biopsy  Result Date: 07/21/2016 INDICATION: Acute renal failure EXAM: ULTRASOUND-GUIDED BIOPSY OF THE RENAL CORTEX.  RANDOM CORE. MEDICATIONS: None. ANESTHESIA/SEDATION: Fentanyl 50 mcg IV; Versed 1 mg IV Moderate Sedation Time:  10 The patient was continuously monitored during the procedure by the interventional radiology nurse under my direct supervision. FLUOROSCOPY TIME:  None. COMPLICATIONS: None immediate. PROCEDURE: Informed written consent was obtained from the patient after a thorough discussion of the procedural risks, benefits and alternatives. All questions were addressed. Maximal Sterile Barrier Technique was utilized including caps, mask, sterile gowns, sterile gloves, sterile drape, hand hygiene and skin antiseptic. A timeout was performed  prior to the initiation of the procedure. The back was prepped with ChloraPrep in a sterile fashion, and a sterile drape was applied covering the operative field. A sterile gown and sterile gloves were used for the procedure. Under sonographic guidance, 2 16 gauge core biopsies of the cortex of the lower pole of the left kidney were obtained. Final imaging was performed. Patient tolerated the procedure well without complication. Vital sign monitoring by nursing staff during the procedure will continue as patient is in the special procedures unit for post procedure observation. FINDINGS: The images document guide needle placement within the left lower pole renal cortex. Post  biopsy images demonstrate no evidence of hemorrhage. IMPRESSION: Successful ultrasound-guided random renal cortex core biopsy. Electronically Signed   By: Marybelle Killings M.D.   On: 07/21/2016 14:36   Ir Fluoro Guide Cv Line Right  Result Date: 07/20/2016 INDICATION: End-stage renal disease. In need of durable intravenous access for the initiation of dialysis. EXAM: TUNNELED CENTRAL VENOUS HEMODIALYSIS CATHETER PLACEMENT WITH ULTRASOUND AND FLUOROSCOPIC GUIDANCE MEDICATIONS: Ancef 2 gm IV . The antibiotic was given in an appropriate time interval prior to skin puncture. ANESTHESIA/SEDATION: Versed 1 mg IV; Fentanyl 50 mcg IV; Moderate Sedation Time:  17 minutes The patient was continuously monitored during the procedure by the interventional radiology nurse under my direct supervision. FLUOROSCOPY TIME:  Fluoroscopy Time: 12 seconds (2 mGy). COMPLICATIONS: None immediate. PROCEDURE: Informed written consent was obtained from the patient after a discussion of the risks, benefits, and alternatives to treatment. Questions regarding the procedure were encouraged and answered. The right neck and chest were prepped with chlorhexidine in a sterile fashion, and a sterile drape was applied covering the operative field. Maximum barrier sterile technique with sterile gowns and gloves were used for the procedure. A timeout was performed prior to the initiation of the procedure. After creating a small venotomy incision, a micropuncture kit was utilized to access the right internal jugular vein under direct, real-time ultrasound guidance after the overlying soft tissues were anesthetized with 1% lidocaine with epinephrine. Ultrasound image documentation was performed. The microwire was kinked to measure appropriate catheter length. A stiff Glidewire was advanced to the level of the IVC and the micropuncture sheath was exchanged for a peel-away sheath. A Palindrome tunneled hemodialysis catheter measuring 19 cm from tip to cuff  was tunneled in a retrograde fashion from the anterior chest wall to the venotomy incision. The catheter was then placed through the peel-away sheath with tips ultimately positioned within the superior aspect of the right atrium. Final catheter positioning was confirmed and documented with a spot radiographic image. The catheter aspirates and flushes normally. The catheter was flushed with appropriate volume heparin dwells. The catheter exit site was secured with a 0-Prolene retention suture. The venotomy incision was closed with an interrupted 4-0 Vicryl, Dermabond and Steri-strips. Dressings were applied. The patient tolerated the procedure well without immediate post procedural complication. IMPRESSION: Successful placement of 19 cm tip to cuff tunneled hemodialysis catheter via the right internal jugular vein with tips terminating within the superior aspect of the right atrium. The catheter is ready for immediate use. Electronically Signed   By: [name] M.D.   On: 07/20/2016 17:12   Ir US Guide Vasc Access Right  Result Date: 07/20/2016 INDICATION: End-stage renal disease. In need of durable intravenous access for the initiation of dialysis. EXAM: TUNNELED CENTRAL VENOUS HEMODIALYSIS CATHETER PLACEMENT WITH ULTRASOUND AND FLUOROSCOPIC GUIDANCE MEDICATIONS: Ancef 2 gm IV . The antibiotic was given in  an appropriate time interval prior to skin puncture. ANESTHESIA/SEDATION: Versed 1 mg IV; Fentanyl 50 mcg IV; Moderate Sedation Time:  17 minutes The patient was continuously monitored during the procedure by the interventional radiology nurse under my direct supervision. FLUOROSCOPY TIME:  Fluoroscopy Time: 12 seconds (2 mGy). COMPLICATIONS: None immediate. PROCEDURE: Informed written consent was obtained from the patient after a discussion of the risks, benefits, and alternatives to treatment. Questions regarding the procedure were encouraged and answered. The right neck and chest were prepped with  chlorhexidine in a sterile fashion, and a sterile drape was applied covering the operative field. Maximum barrier sterile technique with sterile gowns and gloves were used for the procedure. A timeout was performed prior to the initiation of the procedure. After creating a small venotomy incision, a micropuncture kit was utilized to access the right internal jugular vein under direct, real-time ultrasound guidance after the overlying soft tissues were anesthetized with 1% lidocaine with epinephrine. Ultrasound image documentation was performed. The microwire was kinked to measure appropriate catheter length. A stiff Glidewire was advanced to the level of the IVC and the micropuncture sheath was exchanged for a peel-away sheath. A Palindrome tunneled hemodialysis catheter measuring 19 cm from tip to cuff was tunneled in a retrograde fashion from the anterior chest wall to the venotomy incision. The catheter was then placed through the peel-away sheath with tips ultimately positioned within the superior aspect of the right atrium. Final catheter positioning was confirmed and documented with a spot radiographic image. The catheter aspirates and flushes normally. The catheter was flushed with appropriate volume heparin dwells. The catheter exit site was secured with a 0-Prolene retention suture. The venotomy incision was closed with an interrupted 4-0 Vicryl, Dermabond and Steri-strips. Dressings were applied. The patient tolerated the procedure well without immediate post procedural complication. IMPRESSION: Successful placement of 19 cm tip to cuff tunneled hemodialysis catheter via the right internal jugular vein with tips terminating within the superior aspect of the right atrium. The catheter is ready for immediate use. Electronically Signed   By: [name] M.D.   On: 07/20/2016 17:12   Dg Chest Port 1 View  Result Date: 07/26/2016 CLINICAL DATA:  Pulmonary nodules EXAM: PORTABLE CHEST 1 VIEW COMPARISON:   06/19/2016, chest CT 06/20/2016 FINDINGS: Right dialysis catheter is in place with the tip at the cavoatrial junction. Postoperative changes on the left. Areas of scarring and nodularity in the left lung are stable. Decreasing airspace opacities in the right lung with some residual areas of nodularity. Heart is borderline in size. No visible effusions. IMPRESSION: Stable scarring and nodularity in the left lung with postoperative changes. Improving areas of nodularity in the right lung. Electronically Signed   By: Rolm Baptise M.D.   On: 07/26/2016 16:30   Ct Maxillofacial Wo Contrast  Result Date: 07/20/2016 CLINICAL DATA:  71 y/o  F; recurrent sinusitis. EXAM: CT MAXILLOFACIAL WITHOUT CONTRAST TECHNIQUE: Multidetector CT imaging of the maxillofacial structures was performed. Multiplanar CT image reconstructions were also generated. A small metallic BB was placed on the right temple in order to reliably differentiate right from left. COMPARISON:  03/13/2016 MRI head.  12/02/2015 CT head. FINDINGS: Frontal sinus: Normally aerated. Patent frontal sinus drainage pathways. Ethmoid sinus: Normally aerated. Maxillary sinuses:  Normally aerated. Sphenoid sinus: Normally aerated. Patent sphenoid ethmoidal recesses. Right ostiomeatal unit:  Patent. Left ostiomeatal unit:  Patent. Nasal passages: Patent. Intact nasal septum. Mild leftward nasal septal deviation and small spur. Other: Orbits and intracranial compartment are  unremarkable. Mastoid air cells are normally pneumatized. IMPRESSION: Normally aerated paranasal sinuses and patent sinus drainage pathways. Electronically Signed   By: Kristine Garbe M.D.   On: 07/20/2016 14:22    Time Spent in minutes  26   HONGALGI,ANAND, MD, FACP, FHM. [facility] Pager [phone]  If 7PM-7AM, please contact night-coverage [email or web address] Password [email or web address] 08/12/2016, 5:05 PM

## 2016-08-12 NOTE — Progress Notes (Signed)
Subjective: Interval History: has complaints needs transportation to and from HD.  Objective: Vital signs in last 24 hours: Temp:  [98.2 F (36.8 C)-98.9 F (37.2 C)] 98.9 F (37.2 C) (04/06 0816) Pulse Rate:  [65-92] 74 (04/06 0816) Resp:  [16] 16 (04/06 0816) BP: (105-158)/(69-87) 149/75 (04/06 0816) SpO2:  [99 %-100 %] 100 % (04/06 0816) Weight:  [48.7 kg (107 lb 6.4 oz)-49 kg (108 lb 0.4 oz)] 48.7 kg (107 lb 6.4 oz) (04/05 2217) Weight change: -2.724 kg (-6 lb 0.1 oz)  Intake/Output from previous day: 04/05 0701 - 04/06 0700 In: 720 [P.O.:720] Out: 1485 [Urine:150] Intake/Output this shift: No intake/output data recorded.  General appearance: alert, cooperative and no distress Resp: clear to auscultation bilaterally Chest wall: no tenderness, IJ cath Cardio: S1, S2 normal and systolic murmur: systolic ejection 2/6, decrescendo at 2nd left intercostal space GI: soft, pos bs, liver down4 cm Extremities: extremities normal, atraumatic, no cyanosis or edema  Lab Results:  Recent Labs  08/11/16 0531 08/12/16 0510  WBC 5.0 4.3  HGB 11.1* 11.7*  HCT 34.1* 36.5  PLT 52* 53*   BMET:  Recent Labs  08/11/16 0531 08/12/16 0510  NA 136 135  K 4.0 3.7  CL 99* 97*  CO2 27 26  GLUCOSE 78 98  BUN 22* 11  CREATININE 5.38* 3.70*  CALCIUM 8.6* 8.0*   No results for input(s): PTH in the last 72 hours. Iron Studies: No results for input(s): IRON, TIBC, TRANSFERRIN, FERRITIN in the last 72 hours.  Studies/Results: No results found.  I have reviewed the patient's current medications.  Assessment/Plan: 1 ESRD HD TTS.  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

## 2016-08-12 NOTE — Care Management Note (Signed)
Case Management Note  Patient Details  Name: KAYSHA PARSELL MRN: [id number] Date of Birth: 03-14-1946  Subjective/Objective:    CM following for progression and d/c planning.                Action/Plan: 08/12/2016 pt requesting shower stool, will inform of $38 copay, rolling walker also ordered no copay for delivery to room. Pt has selected [facility] for [facility] services previously . This CM notified [facility] rep Edwina or plan to d/c this pt later today or tomorrow after HD. Pt given forms to apply for SCAT and provided with pen to complete her par of this application. CSW V Crawford aware and will complete the rest of he application. Talked with pt about asking her family to assist with transportation until SCAT is in place. She has family who are able to assist however would rather not ask for help. This CM assured this pt that she will need the assistance of her family initially.   Expected Discharge Date:                  Expected Discharge Plan:  [place]  In-House Referral:  NA  Discharge planning Services  CM Consult  Post Acute Care Choice:  Home Health, Durable Medical Equipment Choice offered to:  Patient  DME Arranged:  Walker rolling, Shower stool DME Agency:  [place]:  PT [place]:  [place]  Status of Service:  Completed, signed off  If discussed at [place] of Stay Meetings, dates discussed:    Additional Comments:  Adron Bene, RN 08/12/2016, 11:38 AM

## 2016-08-12 NOTE — Progress Notes (Signed)
Platelet count 53k today.  Will plan for surgery at some point next week as long as platelets are improving.  If she discharges, our office will arrange outpatient surgery.  [name], [facility] 08/12/2016 7:51 AM

## 2016-08-13 DIAGNOSIS — D696 Thrombocytopenia, unspecified: Secondary | ICD-10-CM

## 2016-08-13 LAB — CBC
HCT: 32.8 % — ABNORMAL LOW (ref 36.0–46.0)
HEMOGLOBIN: 10.6 g/dL — AB (ref 12.0–15.0)
MCH: 30.6 pg (ref 26.0–34.0)
MCHC: 32.3 g/dL (ref 30.0–36.0)
MCV: 94.8 fL (ref 78.0–100.0)
Platelets: 27 10*3/uL — CL (ref 150–400)
RBC: 3.46 MIL/uL — ABNORMAL LOW (ref 3.87–5.11)
RDW: 23.9 % — ABNORMAL HIGH (ref 11.5–15.5)
WBC: 4.6 10*3/uL (ref 4.0–10.5)

## 2016-08-13 LAB — RENAL FUNCTION PANEL
ALBUMIN: 3.2 g/dL — AB (ref 3.5–5.0)
ANION GAP: 14 (ref 5–15)
BUN: 23 mg/dL — ABNORMAL HIGH (ref 6–20)
CALCIUM: 8.2 mg/dL — AB (ref 8.9–10.3)
CO2: 24 mmol/L (ref 22–32)
Chloride: 99 mmol/L — ABNORMAL LOW (ref 101–111)
Creatinine, Ser: 5.3 mg/dL — ABNORMAL HIGH (ref 0.44–1.00)
GFR calc non Af Amer: 7 mL/min — ABNORMAL LOW (ref >60)
GFR, EST AFRICAN AMERICAN: 8 mL/min — AB (ref >60)
GLUCOSE: 76 mg/dL (ref 65–99)
PHOSPHORUS: 3.7 mg/dL (ref 2.5–4.6)
Potassium: 4.1 mmol/L (ref 3.5–5.1)
SODIUM: 137 mmol/L (ref 135–145)

## 2016-08-13 MED ORDER — SULFAMETHOXAZOLE-TRIMETHOPRIM 800-160 MG PO TABS: 1.0000 | ORAL_TABLET | ORAL | Status: DC

## 2016-08-13 MED ORDER — PENTAFLUOROPROP-TETRAFLUOROETH EX AERO: 1.0000 "application " | INHALATION_SPRAY | CUTANEOUS | Status: DC | PRN

## 2016-08-13 MED ORDER — ALTEPLASE 2 MG IJ SOLR
2.0000 mg | Freq: Once | INTRAMUSCULAR | Status: DC | PRN
Start: 2016-08-13 — End: 2016-08-14

## 2016-08-13 MED ORDER — ACETAMINOPHEN 325 MG PO TABS
ORAL_TABLET | ORAL | Status: AC
  Filled 2016-08-13: qty 2

## 2016-08-13 MED ORDER — LIDOCAINE HCL (PF) 1 % IJ SOLN: 5.0000 mL | INTRAMUSCULAR | Status: DC | PRN

## 2016-08-13 MED ORDER — DARBEPOETIN ALFA 60 MCG/0.3ML IJ SOSY
PREFILLED_SYRINGE | INTRAMUSCULAR | Status: AC
  Filled 2016-08-13: qty 0.3

## 2016-08-13 MED ORDER — SODIUM CHLORIDE 0.9 % IV SOLN: 100.0000 mL | INTRAVENOUS | Status: DC | PRN

## 2016-08-13 MED ORDER — LIDOCAINE-PRILOCAINE 2.5-2.5 % EX CREA: 1.0000 "application " | TOPICAL_CREAM | CUTANEOUS | Status: DC | PRN

## 2016-08-13 MED ORDER — HEPARIN SODIUM (PORCINE) 1000 UNIT/ML DIALYSIS: 40.0000 [IU]/kg | Freq: Once | INTRAMUSCULAR | Status: DC

## 2016-08-13 MED ORDER — HEPARIN SODIUM (PORCINE) 1000 UNIT/ML DIALYSIS: 1000.0000 [IU] | INTRAMUSCULAR | Status: DC | PRN

## 2016-08-13 NOTE — Procedures (Signed)
I was present at this session.  I have reviewed the session itself and made appropriate changes.  HD via PC. Flow 350, som positional nature, vol and bp ok.  [name] L 4/7/20189:29 AM

## 2016-08-13 NOTE — Progress Notes (Signed)
PROGRESS NOTE                                                                                                                                                                                                             Patient Demographics:    [name], is a 71 y.o. female, DOB - Dec 07, 1945, YOM:[id number]  Admit date - 07/20/2016   Admitting Physician Waldemar Dickens, MD  Outpatient Primary MD for the patient is [facility], MD  LOS - 24  Outpatient Specialists: NONE  No chief complaint on file.     Brief Narrative   71 y.o.femalewith medical history significant for COPD, esophageal stricture, GERD, hiatal hernia, hyperlipidemia, hypertension, IBS, cervical disc disease and history of lung nodules with extensive workup including lung biopsy ruling out Sjogren's disease. Pt has had recurrent history of sinusitis since last fall, treated with several rounds of antibiotics and corticosteroids which have not resulted in significant clinical improvement. She was hospitalized 2 months ago for fever/weight loss and relative hypotension with anorexia and night sweats and was treated for community-acquired pneumonia. She has continued to have sinus drainage with some epistaxis and bloody secretions and also had nausea with dysgeusia for the past few weeks or so. Pt also reported severe muscle pain and leg cramps over the same time period. Of note, pt is followed by Dr. Elsworth Soho for abnormal CT imaging &pulmonary nodules. Work up for this has included an open lung biopsy in 11/2008 in the setting of fever/night sweats and CT scan with pulmonary nodules which showed possible inflammatory pseudotumor vs reaction to old bronchopneumonia associated with some carcinoid tumorlets. Salivary  gland biospy 03/2009 was negative for Sjogren's disease and negative for autoimmune work up. Serial CT's were planned but she was lost to follow up.  She had a repeat CT of the chest in 06/2016 which showed volume loss, post surgical changes of the left hemithorax and RLL pulmonary nodules.   In regards to pt's creatinine, in January of 2018 was 1.7 and in February 2018 it ranged between 1.6 and 2.1 with a peak of 2.8 that prompted referral to nephrology. After evaluation 07/20/16, she was found to have an elevated creatinine level of 12.4 and given the high risk of suspicion for vasculitis pt was directly admitted to hospital for evaluation.  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 She was on solumedrol and then switched to PO prednisone. She is required to have plasmapheresis on alternate days for 2 weeks (started 07/22/2016).   Subjective:   No complaints reported. Aware of that her fistula procedure was postponed and was hoping that this could have been done in the hospital.No bleeding reported.   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 Renal started on Cyclophosphamide 100 mg PO QD.  - She has outpt HD established ,  TTS. ( has spot at [facility]) - Nephrology managing no plans to start cytotoxan at this juncture - Vascular on board and access surgery that was planned for 4/6 has been canceled until thrombocytopenia is better. - repeat anti GBM of 34.  - Discussed with Dr. Jimmy Footman: Okay to discharge once transport arrangements from  home to outpatient HD have been arranged by social work, outpatient follow-up with Dr. Joelyn Oms who will coordinate AV fistula placement.  Essential hypertension - Continue Norvasc . Controlled  Anemia of chronic renal disease -Aranesp when necessary with dialysis. Stable.  Hyperphosphatemia - Low phosphorus diet, monitor with hemodialysis - PTH level 109  Thrombocytopenia Platelet counts had improved to to the 50s for the last couple of days but has dropped abruptly to 27 on 4/7. Unclear HR G.? Lab error. No bleeding reported. Hasn't received Cytoxan since 3/27.? Residual effects of Cytoxan. Prior to 3/27, platelet counts were in the 140s-150s. Follow CBC in a.m.  Leukocytosis Resolved.   Dyslipidemia - Continue Lipitor   RLL pulmonary nodules - Pulm recommended prophylactic bactrim while on immunosuppresion - Repeat CT imaging done this admission shows unchanged pulmonary nodule. If comparative CT in 2 months unchanged and no evidence of renal recover, doubt that there is benefit of continued therapy. - Monitor for hemoptysis - Follow up with Dr. Elsworth Soho at discharge >had prior PET arranged for May 2018 to review pulmonary nodules.  Constipation Intermittent. Continue bowel regimen.  Code Status : Full code  Family Communication  : None at bedside  Disposition Plan  : Planned discharge for 4/7 canceled due to worsening thrombocytopenia. Family to assist with transportation initially until SCAT arranged.  Barriers For Discharge : D/c After access, vein mapping in planned.  Consults  :   Nephrology Pulmonary IR    Procedures    RIJ cathter for HD placed 07/20/2016  Renal biopsy 07/21/2016  Plasmapheresis 07/22/16  HD TTS  Cytoxan - started 07/27/16  DVT Prophylaxis  :  SCDs ( no heparin for 1 month post renal bx)  Lab Results  Component Value Date   PLT 27 (LL) 08/13/2016    Antibiotics  :    Anti-infectives    Start     Dose/Rate Route Frequency  Ordered Stop   08/15/16 1800  sulfamethoxazole-trimethoprim (BACTRIM DS,SEPTRA DS) 800-160 MG per tablet 1 tablet     1 tablet Oral Once per day on Mon Wed Fri 08/13/16 1109  07/27/16 0900  sulfamethoxazole-trimethoprim (BACTRIM DS,SEPTRA DS) 800-160 MG per tablet 1 tablet  Status:  Discontinued     1 tablet Oral Once per day on Mon Wed Fri 07/26/16 1639 08/13/16 1109   07/20/16 1430  ceFAZolin (ANCEF) IVPB 2g/100 mL premix     2 g 200 mL/hr over 30 Minutes Intravenous To Radiology 07/20/16 1424 07/20/16 1641        Objective:   Vitals:   08/13/16 1015 08/13/16 1045 08/13/16 1115 08/13/16 1200  BP: (!) 137/91 132/87 (!) 156/82 138/90  Pulse: 76 76 73 74  Resp: '16 15 16   ' Temp:      TempSrc:      SpO2:      Weight:      Height:      Temperature 70F and oxygen saturation 100%.  Wt Readings from Last 3 Encounters:  08/13/16 49.2 kg (108 lb 7.5 oz)  06/29/16 65.8 kg (145 lb)  06/19/16 64 kg (141 lb)     Intake/Output Summary (Last 24 hours) at 08/13/16 1333 Last data filed at 08/12/16 2145  Gross per 24 hour  Intake              243 ml  Output                0 ml  Net              243 ml    Physical Exam  Gen: Pleasant elderly female ambulating comfortably in the room. HEENT: moist mucosa,  rt temp HD catheter, atraumatic, normocephalic Chest: clear b/l, no wheezes, equal chest rise. No increased work of breathing. CVS: N S1&S2, no murmurs, no pedal edema or JVD. Not on telemetry. GI: soft, NT, ND, Musculoskeletal: warm, no edema CNS: Alert and oriented. No focal neurological deficits.     Data Review:    CBC  Recent Labs Lab 08/09/16 2131 08/10/16 0654 08/11/16 0531 08/12/16 0510 08/13/16 0236  WBC 5.3 3.8* 5.0 4.3 4.6  HGB 12.5 11.2* 11.1* 11.7* 10.6*  HCT 37.9 34.1* 34.1* 36.5 32.8*  PLT 44* 41* 52* 53* 27*  MCV 94.3 94.5 94.5 94.6 94.8  MCH 31.1 31.0 30.7 30.3 30.6  MCHC 33.0 32.8 32.6 32.1 32.3  RDW 23.9* 24.0* 23.8* 23.9* 23.9*     Chemistries   Recent Labs Lab 08/09/16 2131 08/10/16 0654 08/11/16 0531 08/12/16 0510 08/13/16 0236  NA 136 136 136 135 137  K 5.1 4.9 4.0 3.7 4.1  CL 99* 100* 99* 97* 99*  CO2 '26 25 27 26 24  ' GLUCOSE 115* 102* 78 98 76  BUN 9 13 22* 11 23*  CREATININE 3.00* 3.73* 5.38* 3.70* 5.30*  CALCIUM 8.0* 8.0* 8.6* 8.0* 8.2*     Lab Results  Component Value Date   HGBA1C 5.8 02/02/2010    Inpatient Medications  Scheduled Meds: . sodium chloride   Intravenous Once  . sodium chloride   Intravenous Once  . amLODipine  5 mg Oral QHS  . aspirin EC  81 mg Oral Daily  . atorvastatin  10 mg Oral q1800  . darbepoetin (ARANESP) injection - DIALYSIS  60 mcg Intravenous Q Sat-HD  . feeding supplement (NEPRO CARB STEADY)  237 mL Oral BID BM  . ferric gluconate (FERRLECIT/NULECIT) IV  125 mg Intravenous Q T,Th,Sa-HD  . fluticasone  2 spray Each Nare QHS  . heparin  40 Units/kg Dialysis Once in dialysis  . hydrocortisone   Rectal TID  . loratadine  10  mg Oral Daily  . multivitamin  1 tablet Oral QHS  . pantoprazole  40 mg Oral Daily  . polyethylene glycol  17 g Oral Daily  . predniSONE  20 mg Oral Q breakfast  . senna  2 tablet Oral Daily  . sodium chloride  2 spray Each Nare TID  . sodium chloride flush  3 mL Intravenous Q12H  . [START ON 08/15/2016] sulfamethoxazole-trimethoprim  1 tablet Oral Once per day on Mon Wed Fri   Continuous Infusions:  PRN Meds:.sodium chloride, sodium chloride, sodium chloride, acetaminophen, alteplase, bisacodyl, calcium carbonate, heparin, lidocaine (PF), lidocaine-prilocaine, methocarbamol (ROBAXIN)  IV, ondansetron **OR** ondansetron (ZOFRAN) IV, pentafluoroprop-tetrafluoroeth, sodium chloride flush, sorbitol, zolpidem  Micro Results No results found for this or any previous visit (from the past 240 hour(s)).  Radiology Reports Ct Chest Wo Contrast  Result Date: 07/29/2016 CLINICAL DATA:  71 year old female inpatient admitted for acute kidney  injury 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 Longstanding history of indeterminate pulmonary nodules, described as inflammatory pseudotumors on surgical lung biopsy of the left lung on 11/06/2008. EXAM: CT CHEST WITHOUT CONTRAST TECHNIQUE: Multidetector CT imaging of the chest was performed following the standard protocol without IV contrast. COMPARISON:  06/20/2016 chest CT.  07/26/2016 chest radiograph. FINDINGS: Cardiovascular: Top-normal heart size . No significant pericardial fluid/thickening. Right internal jugular dialysis catheter terminates at the cavoatrial junction. Atherosclerotic nonaneurysmal thoracic aorta. Stable dilated pulmonary arteries (main pulmonary artery diameter 3.4 cm). Mediastinum/Nodes: Stable heterogeneous thyroid gland with possible confluent small hypodense thyroid nodules. 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 No pathologically enlarged axillary, mediastinal or gross hilar lymph nodes, noting limited sensitivity for the detection of hilar adenopathy on this noncontrast study. Lungs/Pleura: No pneumothorax. New trace dependent bilateral pleural effusions. Stable postsurgical changes from wedge resection in the left upper and left lower lobes. Mild centrilobular and paraseptal emphysema with mild diffuse bronchial wall thickening. No acute consolidative airspace disease or new significant pulmonary nodules. Stable parenchymal band in the posterior right upper lobe with associated distortion, compatible with postinfectious/postinflammatory scarring. Stable chronic thickening of the peribronchovascular interstitium throughout both lungs with associated chronic mild distortion. Scattered irregular pulmonary nodules in the right lung are unchanged in the interval and not convincingly changed back to 12/04/2009 chest CT, compatible with benign inflammatory nodules. For example a  1.5 x 1.0 cm peripheral right upper lobe nodule (series 4/ image 41), measuring 1.5 x 1.0 cm on 06/20/2016 and 1.5 x 0.9 cm on 12/04/2009. Partially cystic irregular right lower lobe 2.2 x 1.7 cm nodule, measuring 2.3 x 1.8 cm on 06/20/2016 and 2.3 x 1.9 cm on 12/04/2009. Upper abdomen: Unremarkable. Musculoskeletal: No aggressive appearing focal osseous lesions. Mild thoracic spondylosis. New mild anasarca. IMPRESSION: 1. New mild anasarca and trace dependent bilateral pleural effusions. Top-normal heart size. 2. Scattered irregular pulmonary nodules in the right lung are stable in the interval and not convincingly changed back to 2011, compatible with benign inflammatory nodules given the results of the 2010 surgical left lung biopsy. 3. Stable postsurgical and postinflammatory scarring in both lungs. No acute pulmonary disease. 4. Mild emphysema with mild diffuse bronchial wall thickening, suggesting COPD. 5. Aortic atherosclerosis. 6. Stable dilated main pulmonary artery, suggesting pulmonary arterial hypertension. Electronically Signed   By: Ilona Sorrel M.D.   On: 07/29/2016 15:48   US Biopsy  Result Date: 07/21/2016 INDICATION: Acute renal failure EXAM: ULTRASOUND-GUIDED BIOPSY OF THE RENAL CORTEX.  RANDOM CORE. MEDICATIONS: None. ANESTHESIA/SEDATION: Fentanyl 50 mcg IV; Versed 1 mg IV Moderate Sedation Time:  10 The patient was continuously monitored during the procedure by the interventional radiology nurse under my direct supervision. FLUOROSCOPY TIME:  None. COMPLICATIONS: None immediate. PROCEDURE: Informed written consent was obtained from the patient after a thorough discussion of the procedural risks, benefits and alternatives. All questions were addressed. Maximal Sterile Barrier Technique was utilized including caps, mask, sterile gowns, sterile gloves, sterile drape, hand hygiene and skin antiseptic. A timeout was performed prior to the initiation of the procedure. The back was prepped with  ChloraPrep in a sterile fashion, and a sterile drape was applied covering the operative field. A sterile gown and sterile gloves were used for the procedure. Under sonographic guidance, 2 16 gauge core biopsies of the cortex of the lower pole of the left kidney were obtained. Final imaging was performed. Patient tolerated the procedure well without complication. Vital sign monitoring by nursing staff during the procedure will continue as patient is in the special procedures unit for post procedure observation. FINDINGS: The images document guide needle placement within the left lower pole renal cortex. Post biopsy images demonstrate no evidence of hemorrhage. IMPRESSION: Successful ultrasound-guided random renal cortex core biopsy. Electronically Signed   By: Marybelle Killings M.D.   On: 07/21/2016 14:36   Ir Fluoro Guide Cv Line Right  Result Date: 07/20/2016 INDICATION: End-stage renal disease. In need of durable intravenous access for the initiation of dialysis. EXAM: TUNNELED CENTRAL VENOUS HEMODIALYSIS CATHETER PLACEMENT WITH ULTRASOUND AND FLUOROSCOPIC GUIDANCE MEDICATIONS: Ancef 2 gm IV . The antibiotic was given in an appropriate time interval prior to skin puncture. ANESTHESIA/SEDATION: Versed 1 mg IV; Fentanyl 50 mcg IV; Moderate Sedation Time:  17 minutes The patient was continuously monitored during the procedure by the interventional radiology nurse under my direct supervision. FLUOROSCOPY TIME:  Fluoroscopy Time: 12 seconds (2 mGy). COMPLICATIONS: None immediate. PROCEDURE: Informed written consent was obtained from the patient after a discussion of the risks, benefits, and alternatives to treatment. Questions regarding the procedure were encouraged and answered. The right neck and chest were prepped with chlorhexidine in a sterile fashion, and a sterile drape was applied covering the operative field. Maximum barrier sterile technique with sterile gowns and gloves were used for the procedure. A timeout  was performed prior to the initiation of the procedure. After creating a small venotomy incision, a micropuncture kit was utilized to access the right internal jugular vein under direct, real-time ultrasound guidance after the overlying soft tissues were anesthetized with 1% lidocaine with epinephrine. Ultrasound image documentation was performed. The microwire was kinked to measure appropriate catheter length. A stiff Glidewire was advanced to the level of the IVC and the micropuncture sheath was exchanged for a peel-away sheath. A Palindrome tunneled hemodialysis catheter measuring 19 cm from tip to cuff was tunneled in a retrograde fashion from the anterior chest wall to the venotomy incision. The catheter was then placed through the peel-away sheath with tips ultimately positioned within the superior aspect of the right atrium. Final catheter positioning was confirmed and documented with a spot radiographic image. The catheter aspirates and flushes normally. The catheter was flushed with appropriate volume heparin dwells. The catheter exit site was secured with a 0-Prolene retention suture. The venotomy incision was closed with an interrupted 4-0 Vicryl, Dermabond and Steri-strips. Dressings were applied. The patient tolerated the procedure well without immediate post procedural complication. IMPRESSION: Successful placement of 19 cm tip to cuff tunneled  hemodialysis catheter via the right internal jugular vein with tips terminating within the superior aspect of the right atrium. The catheter is ready for immediate use. Electronically Signed   By: [name] M.D.   On: 07/20/2016 17:12   Ir US Guide Vasc Access Right  Result Date: 07/20/2016 INDICATION: End-stage renal disease. In need of durable intravenous access for the initiation of dialysis. EXAM: TUNNELED CENTRAL VENOUS HEMODIALYSIS CATHETER PLACEMENT WITH ULTRASOUND AND FLUOROSCOPIC GUIDANCE MEDICATIONS: Ancef 2 gm IV . The antibiotic was given in an  appropriate time interval prior to skin puncture. ANESTHESIA/SEDATION: Versed 1 mg IV; Fentanyl 50 mcg IV; Moderate Sedation Time:  17 minutes The patient was continuously monitored during the procedure by the interventional radiology nurse under my direct supervision. FLUOROSCOPY TIME:  Fluoroscopy Time: 12 seconds (2 mGy). COMPLICATIONS: None immediate. PROCEDURE: Informed written consent was obtained from the patient after a discussion of the risks, benefits, and alternatives to treatment. Questions regarding the procedure were encouraged and answered. The right neck and chest were prepped with chlorhexidine in a sterile fashion, and a sterile drape was applied covering the operative field. Maximum barrier sterile technique with sterile gowns and gloves were used for the procedure. A timeout was performed prior to the initiation of the procedure. After creating a small venotomy incision, a micropuncture kit was utilized to access the right internal jugular vein under direct, real-time ultrasound guidance after the overlying soft tissues were anesthetized with 1% lidocaine with epinephrine. Ultrasound image documentation was performed. The microwire was kinked to measure appropriate catheter length. A stiff Glidewire was advanced to the level of the IVC and the micropuncture sheath was exchanged for a peel-away sheath. A Palindrome tunneled hemodialysis catheter measuring 19 cm from tip to cuff was tunneled in a retrograde fashion from the anterior chest wall to the venotomy incision. The catheter was then placed through the peel-away sheath with tips ultimately positioned within the superior aspect of the right atrium. Final catheter positioning was confirmed and documented with a spot radiographic image. The catheter aspirates and flushes normally. The catheter was flushed with appropriate volume heparin dwells. The catheter exit site was secured with a 0-Prolene retention suture. The venotomy incision was  closed with an interrupted 4-0 Vicryl, Dermabond and Steri-strips. Dressings were applied. The patient tolerated the procedure well without immediate post procedural complication. IMPRESSION: Successful placement of 19 cm tip to cuff tunneled hemodialysis catheter via the right internal jugular vein with tips terminating within the superior aspect of the right atrium. The catheter is ready for immediate use. Electronically Signed   By: [name] M.D.   On: 07/20/2016 17:12   Dg Chest Port 1 View  Result Date: 07/26/2016 CLINICAL DATA:  Pulmonary nodules EXAM: PORTABLE CHEST 1 VIEW COMPARISON:  06/19/2016, chest CT 06/20/2016 FINDINGS: Right dialysis catheter is in place with the tip at the cavoatrial junction. Postoperative changes on the left. Areas of scarring and nodularity in the left lung are stable. Decreasing airspace opacities in the right lung with some residual areas of nodularity. Heart is borderline in size. No visible effusions. IMPRESSION: Stable scarring and nodularity in the left lung with postoperative changes. Improving areas of nodularity in the right lung. Electronically Signed   By: Rolm Baptise M.D.   On: 07/26/2016 16:30   Ct Maxillofacial Wo Contrast  Result Date: 07/20/2016 CLINICAL DATA:  71 y/o  F; recurrent sinusitis. EXAM: CT MAXILLOFACIAL WITHOUT CONTRAST TECHNIQUE: Multidetector CT imaging of the maxillofacial structures was performed. Multiplanar  CT image reconstructions were also generated. A small metallic BB was placed on the right temple in order to reliably differentiate right from left. COMPARISON:  03/13/2016 MRI head.  12/02/2015 CT head. FINDINGS: Frontal sinus: Normally aerated. Patent frontal sinus drainage pathways. Ethmoid sinus: Normally aerated. Maxillary sinuses:  Normally aerated. Sphenoid sinus: Normally aerated. Patent sphenoid ethmoidal recesses. Right ostiomeatal unit:  Patent. Left ostiomeatal unit:  Patent. Nasal passages: Patent. Intact nasal septum.  Mild leftward nasal septal deviation and small spur. Other: Orbits and intracranial compartment are unremarkable. Mastoid air cells are normally pneumatized. IMPRESSION: Normally aerated paranasal sinuses and patent sinus drainage pathways. Electronically Signed   By: Kristine Garbe M.D.   On: 07/20/2016 14:22    Time Spent in minutes  35   HONGALGI,ANAND, MD, FACP, FHM. [facility] Pager [phone]  If 7PM-7AM, please contact night-coverage [email or web address] Password [email or web address] 08/13/2016, 1:33 PM

## 2016-08-13 NOTE — Progress Notes (Signed)
Subjective: Interval History: has no complaint .  Objective: Vital signs in last 24 hours: Temp:  [97 F (36.1 C)-98.9 F (37.2 C)] 97 F (36.1 C) (04/07 0745) Pulse Rate:  [73-85] 73 (04/07 0845) Resp:  [16-20] 17 (04/07 0845) BP: (134-145)/(64-85) 141/82 (04/07 0845) SpO2:  [100 %] 100 % (04/07 0750) Weight:  [49.2 kg (108 lb 7.5 oz)-49.2 kg (108 lb 8 oz)] 49.2 kg (108 lb 7.5 oz) (04/07 0730) Weight change: -1.585 kg (-3 lb 7.9 oz)  Intake/Output from previous day: 04/06 0701 - 04/07 0700 In: 483 [P.O.:480; I.V.:3] Out: 0  Intake/Output this shift: No intake/output data recorded.  General appearance: alert, cooperative and no distress Resp: clear to auscultation bilaterally Chest wall: IJ PC Cardio: systolic murmur: systolic ejection 2/6, decrescendo at 2nd left intercostal space GI: soft, non-tender; bowel sounds normal; no masses,  no organomegaly Extremities: extremities normal, atraumatic, no cyanosis or edema  Lab Results:  Recent Labs  08/12/16 0510 08/13/16 0236  WBC 4.3 4.6  HGB 11.7* 10.6*  HCT 36.5 32.8*  PLT 53* 27*   BMET:  Recent Labs  08/12/16 0510 08/13/16 0236  NA 135 137  K 3.7 4.1  CL 97* 99*  CO2 26 24  GLUCOSE 98 76  BUN 11 23*  CREATININE 3.70* 5.30*  CALCIUM 8.0* 8.2*   No results for input(s): PTH in the last 72 hours. Iron Studies: No results for input(s): IRON, TIBC, TRANSFERRIN, FERRITIN in the last 72 hours.  Studies/Results: No results found.  I have reviewed the patient's current medications.  Assessment/Plan: 1 ESRD  For Hd 2 low ptlts per primary 3 HTN lower 4 Anemia esa/FE 5 HPTH 6 ANCA/AntiGBM  P Hd, esa, d/c per primary    LOS: 24 days   Brendaly Townsel L 08/13/2016,9:26 AM

## 2016-08-14 DIAGNOSIS — R918 Other nonspecific abnormal finding of lung field: Secondary | ICD-10-CM

## 2016-08-14 DIAGNOSIS — Z992 Dependence on renal dialysis: Secondary | ICD-10-CM

## 2016-08-14 DIAGNOSIS — N186 End stage renal disease: Secondary | ICD-10-CM

## 2016-08-14 LAB — RENAL FUNCTION PANEL
Albumin: 3.3 g/dL — ABNORMAL LOW (ref 3.5–5.0)
Anion gap: 13 (ref 5–15)
BUN: 13 mg/dL (ref 6–20)
CALCIUM: 8.4 mg/dL — AB (ref 8.9–10.3)
CHLORIDE: 97 mmol/L — AB (ref 101–111)
CO2: 27 mmol/L (ref 22–32)
CREATININE: 3.64 mg/dL — AB (ref 0.44–1.00)
GFR calc Af Amer: 13 mL/min — ABNORMAL LOW (ref >60)
GFR calc non Af Amer: 12 mL/min — ABNORMAL LOW (ref >60)
GLUCOSE: 123 mg/dL — AB (ref 65–99)
Phosphorus: 3.4 mg/dL (ref 2.5–4.6)
Potassium: 4 mmol/L (ref 3.5–5.1)
SODIUM: 137 mmol/L (ref 135–145)

## 2016-08-14 LAB — CBC
HCT: 37.1 % (ref 36.0–46.0)
Hemoglobin: 11.9 g/dL — ABNORMAL LOW (ref 12.0–15.0)
MCH: 31 pg (ref 26.0–34.0)
MCHC: 32.1 g/dL (ref 30.0–36.0)
MCV: 96.6 fL (ref 78.0–100.0)
PLATELETS: 61 10*3/uL — AB (ref 150–400)
RBC: 3.84 MIL/uL — ABNORMAL LOW (ref 3.87–5.11)
RDW: 24.8 % — ABNORMAL HIGH (ref 11.5–15.5)
WBC: 3.7 10*3/uL — ABNORMAL LOW (ref 4.0–10.5)

## 2016-08-14 MED ORDER — NEPRO/CARBSTEADY PO LIQD: 237.0000 mL | Freq: Two times a day (BID) | ORAL | Status: DC

## 2016-08-14 MED ORDER — SENNA 8.6 MG PO TABS: 2.0000 | ORAL_TABLET | Freq: Every day | ORAL | 0 refills | Status: DC

## 2016-08-14 MED ORDER — RENA-VITE PO TABS: 1.0000 | ORAL_TABLET | Freq: Every day | ORAL | 0 refills | Status: DC

## 2016-08-14 MED ORDER — HYDROCORTISONE 2.5 % RE CREA: TOPICAL_CREAM | Freq: Three times a day (TID) | RECTAL | 0 refills | Status: DC | PRN

## 2016-08-14 MED ORDER — POLYETHYLENE GLYCOL 3350 17 G PO PACK: 17.0000 g | PACK | Freq: Every day | ORAL | 0 refills | Status: DC

## 2016-08-14 MED ORDER — SULFAMETHOXAZOLE-TRIMETHOPRIM 800-160 MG PO TABS: 1.0000 | ORAL_TABLET | ORAL | 0 refills | Status: DC

## 2016-08-14 MED ORDER — PREDNISONE 20 MG PO TABS: 20.0000 mg | ORAL_TABLET | Freq: Every day | ORAL | 0 refills | Status: DC

## 2016-08-14 MED ORDER — BISACODYL 10 MG RE SUPP: 10.0000 mg | Freq: Every day | RECTAL | 0 refills | Status: DC | PRN

## 2016-08-14 NOTE — Discharge Instructions (Signed)

## 2016-08-14 NOTE — Discharge Summary (Signed)
Physician Discharge Summary  [name] OVF:[id number] DOB: [birth year]  PCP: Merrilee Seashore, MD  Admit date: 07/20/2016 Discharge date: 08/14/2016  Recommendations for Outpatient Follow-up:  1. Dr. Mauro Kaufmann Ramachandran,PCP in 1 week. 2. Dr. Pearson Grippe, Nephrology. Follow-up for scheduled hemodialysis on Tuesdays, Thursdays & Saturdays. Nephrologist will arrange for outpatient dialysis access creation. 3. Dr. Kara Mead, Pulmonology: Follow-up regarding pulmonary nodules.  Home Health: PT Equipment/Devices: Rolling walker and shower stool    Discharge Condition: Improved and stable  CODE STATUS: Full  Diet recommendation: Heart healthy diet.  Discharge Diagnoses:  Active Problems:   Essential hypertension   Acute renal failure (ARF) (HCC)   RPGN (rapidly progressive glomerulonephritis)   ANCA-positive vasculitis (HCC)   Hyperphosphatemia   Brief Summary: 71 y.o.femalewith medical history significant for COPD, esophageal stricture, GERD, hiatal hernia, hyperlipidemia, hypertension, IBS, cervical disc disease and history of lung nodules with extensive workup including lung biopsy ruling out Sjogren's disease. Pt has had recurrent history of sinusitis since last fall, treated with several rounds of antibiotics and corticosteroids which have not resulted in significant clinical improvement. She was hospitalized 2 months ago for fever/weight loss and relative hypotension with anorexia and night sweats and was treated for community-acquired pneumonia. She has continued to have sinus drainage with some epistaxis and bloody secretions and also had nausea with dyspepsia for the past few weeks or so prior to admission. Pt also reported severe muscle pain and leg cramps over the same time period. Of note, pt is followed by Dr. Elsworth Soho for abnormal CT imaging &pulmonary nodules. Work up for this has included an open lung biopsy in 11/2008 in the setting of fever/night sweats and CT scan with  pulmonary nodules which showed possible inflammatory pseudotumor vs reaction to old bronchopneumonia associated with some carcinoid tumorlets. Salivary gland biospy 03/2009 was negative for Sjogren's disease and negative for autoimmune work up. Serial CT's were planned but she was lost to follow up. She had a repeat CT of the chest in 06/2016 which showed volume loss, post surgical changes of the left hemithorax and RLL pulmonary nodules.   In regards to pt's creatinine, in January of 2018 was 1.7 and in February 2018 it ranged between 1.6 and 2.1 with a peak of 2.8 that prompted referral to nephrology. After evaluation 73/14/18, she was found to have an elevated creatinine level of 12.4 and given the high risk of suspicion for vasculitis pt was directly admitted to hospital for evaluation.  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 She was on solumedrol and then switched to PO prednisone.     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 Aggressive outpatient workup was underway to find an etiology. Last known normal creatinine on 09/10/15 of 0.93. Diminished renal function first noted on February 2018 >1.7. - Nephrology was consulted and continue to see patient in the hospital assisting with evaluation and management. - Tunneled catheter placed 07/20/2016,  started HD 3/14 for uremic signs and symptoms . 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 - Started empirically on solumedrol 07/20/16 for possible ANCA  vasculitis (completed 3 days of IV solumedrol  and now on PO prednisone). Discussed with Dr. Jimmy Footman and recommended DC on current dose of Prednisone. - Hep B negative. ESR & CRP elevated. - On 07/22/16: Per Nephrology, in addition to positive myeloperoxidase antibodies, the patient also had anti-GBM positive status. As recommended for treatment guidelines, she was started on plasmapheresis 3/16,  every other day for 2 weeks & completed course. She then became anuric. Renal started on Cyclophosphamide 100 mg PO QD on 07/26/16 after reviewing renal biopsy results. Cytoxan was discontinued on 3/27 due to new/worsening thrombocytopenia and no plans to restart Cytoxan for renal indications. - She did not show any meaningful signs of renal recovery and felt to be at ESRD. She has outpt HD established ,  TTS. ( has spot at [facility]) - Vascular on board and access surgery that was planned for 4/6 has been canceled until thrombocytopenia is better. - GBM down from 125 to 33 (prior to TPE on 3/21), 34 on 3/25. TPE stopped 3/27. - Discussed with Dr. Jimmy Footman: Okay to discharge, outpatient follow-up with Dr. Joelyn Oms who will coordinate AV fistula placement.  Essential hypertension - Continue Norvasc. HCTZ discontinued. Volume management across dialysis. Controlled  Anemia of chronic disease -Aranesp when necessary with dialysis. Ferritin 1163, Iron 25, TIBC 133 - As per nephrology, likely anemia of chronic disease with recent hospitalization/vasculitis. She has low iron saturation with prohibitively high ferritin for IV iron bolusing. Subsequently received limited iron supplements. - Transfused 3/20 & 3/24. -Stable.   Hyperphosphatemia - Low phosphorus diet, monitor with hemodialysis - PTH level 109  Thrombocytopenia This was new and worsened to the 40's. Felt to be due to Cytoxan which was discontinued on 3/28. She should probably not restart cyclophosphamide unless outpatient pulmonology thinks that this will help. No heparin with hemodialysis  for at least a month post renal biopsy (which was on 07/21/16) and while platelets remained low.  Platelet counts had improved to to the 50s for the last couple of days but dropped abruptly to 27 on 4/7. Likely lab error. No bleeding reported. Hasn't received Cytoxan since 3/27. Prior to 3/27, platelet counts were in the 140s-150s. The next day, platelets improved to 61. Improving. Outpatient follow-up with CBCs across dialysis.  Leukocytosis Resolved.   Dyslipidemia - Continue Lipitor   Chronic sinusitis/Upper respiratory symptoms/RLL pulmonary nodules Stated that she was better after steroids and plasmapheresis. Cyclophosphamide started on 3/20. Pulmonology was consulted Pulmonology initially consulted on 07/26/16. As per their note, she was followed at pulmonology office for lung nodules. Currently admitted with acute renal failure and evidence of ANCA vasculitis, anti-GBM disease, on serologies and on renal biopsy. CT scan sinuses also consistent. As indicated above, she was treated aggressively with plasmapheresis, cyclophosphamide, steroids. They reviewed her past CTs of the chest. Most recent on 06/20/16 showed several round but semisolid nodules and stated that the appearance could also be consistent with Goodpasture's. However the picture was clouded by the fact that she had a lung biopsy 8 years ago that did not show vasculitis but instead showed chronic inflammation with carcinoid and at that point what unclear that there is a unifying diagnosis. They recommended repeating chest CT (after approximately 1 week of therapy and again in 2 months) to look for interval change after immunosuppression and more importantly to establish current baseline appearance of the nodules. They recommended treating with maintenance cyclophosphamide and prednisone for at least 2 months, follow anti-GBM antibody serology for clearance, then repeat CT  chest versus the behavior of the nodules on therapy.  Alternatively could perform PET scan at that time as her outpatient pulmonologist was planning. Posttreatment imaging would guide decisions regarding need for nodule biopsy if believed this was a separate process versus continued therapy etc. - Prophylactic Bactrim 3 times weekly was initiated, while on immunosuppression. - Repeat CT imaging done this admission shows unchanged pulmonary nodule. If comparative CT in 2 months unchanged and no evidence of renal recover, doubt that there is benefit of continued therapy. - Follow up with Dr. Elsworth Soho at discharge >had prior PET arranged for May 2018 to review pulmonary nodules. Pulmonology to determine whether Cytoxan needs to be restarted from a pulmonary perspective once patient's thrombocytopenia has resolved (no plans to resume for renal indications)    GERD - PPI.  Constipation Intermittent. Continue bowel regimen.  Consultations:  Nephrology  Pulmonology  Interventional radiology  Procedures:  RIJ cathter for HD placed 07/20/2016  US guided Renal biopsy 07/21/2016  Plasmapheresis 07/22/16  HD TTS  Cytoxan - started 07/27/16, stopped 3/27   Discharge Instructions  Discharge Instructions    Call MD for:  difficulty breathing, headache or visual disturbances    Complete by:  As directed    Call MD for:  extreme fatigue    Complete by:  As directed    Call MD for:  persistant dizziness or light-headedness    Complete by:  As directed    Call MD for:  persistant nausea and vomiting    Complete by:  As directed    Call MD for:  redness, tenderness, or signs of infection (pain, swelling, redness, odor or green/yellow discharge around incision site)    Complete by:  As directed    Call MD for:  severe uncontrolled pain    Complete by:  As directed    Call MD for:  temperature >100.4    Complete by:  As directed    Diet - low sodium heart healthy    Complete by:  As directed    Increase activity slowly    Complete by:  As  directed        Medication List    STOP taking these medications   acetaminophen 325 MG tablet Commonly known as:  TYLENOL   amoxicillin-clavulanate 500-125 MG tablet Commonly known as:  AUGMENTIN   hydrochlorothiazide 12.5 MG tablet Commonly known as:  HYDRODIURIL     TAKE these medications   amLODipine 5 MG tablet Commonly known as:  NORVASC Take 5 mg by mouth daily.   aspirin EC 81 MG tablet Take 81 mg by mouth daily.   atorvastatin 10 MG tablet Commonly known as:  LIPITOR Take 1 tablet (10 mg total) by mouth daily.   bisacodyl 10 MG suppository Commonly known as:  DULCOLAX Place 1 suppository (10 mg total) rectally daily as needed for moderate constipation.   feeding supplement (NEPRO CARB STEADY) Liqd Take 237 mLs by mouth 2 (two) times daily between meals. What changed:  how much to take  when to take this   hydrocortisone 2.5 % rectal cream Commonly known as:  ANUSOL-HC Place rectally 3 (three) times daily as needed for hemorrhoids or itching.   multivitamin Tabs tablet Take 1 tablet by mouth at bedtime.   pantoprazole 40 MG tablet Commonly known as:  PROTONIX Take 1 tablet (40 mg total) by mouth daily.   polyethylene glycol packet Commonly known as:  MIRALAX / GLYCOLAX Take 17 g by mouth daily. Start taking on:  08/15/2016   predniSONE 20 MG tablet Commonly known as:  DELTASONE Take 1 tablet (20 mg total) by mouth daily with breakfast. Start taking on:  08/15/2016   senna 8.6 MG Tabs tablet Commonly known as:  SENOKOT Take 2 tablets (17.2 mg total) by mouth daily. Start taking on:  08/15/2016   sodium chloride 0.65 % Soln nasal spray Commonly known as:  OCEAN Place 2 sprays into both nostrils 3 (three) times daily.   sulfamethoxazole-trimethoprim 800-160 MG tablet Commonly known as:  BACTRIM DS,SEPTRA DS Take 1 tablet by mouth 3 (three) times a week. Start taking on:  08/15/2016      Follow-up Information    [facility]  Follow up.   Specialty:  Home Health Services Contact information: [address] [place] [address] [phone]        Merrilee Seashore, MD. Schedule an appointment as soon as possible for a visit in 1 week(s).   Specialty:  Internal Medicine Contact information: [address] [place] [address] [phone]        Rexene Agent, MD Follow up.   Specialty:  Nephrology Why:  Follow-up for scheduled hemodialysis on Tuesdays, Thursdays & Saturdays. Contact information: [facility] [phone]        Rigoberto Noel., MD Follow up.   Specialty:  Pulmonary Disease Why:  Follow-up regarding pulmonary nodules. Contact information: [address] [place] [address] [phone]          Allergies  Allergen Reactions  . Ace Inhibitors Anaphylaxis    REACTION: angioedema  . Other Anaphylaxis    Calcium Channel Blocking Agent Diltiazem Analogues  . Pneumococcal Vaccines Other (See Comments)    Red rash, Knot (sore and itches), A fever for a couple days (per pt report)  . Tetanus Toxoid Other (See Comments)    Cellulitis in arm      Procedures/Studies: Ct Chest Wo Contrast  Result Date: 07/29/2016 CLINICAL DATA:  71 year old female inpatient admitted for 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 Longstanding history of indeterminate pulmonary nodules, described as inflammatory pseudotumors on surgical lung biopsy of the left lung on 11/06/2008. EXAM: CT CHEST WITHOUT CONTRAST TECHNIQUE: Multidetector CT imaging of the chest was performed following the standard protocol without IV contrast. COMPARISON:  06/20/2016 chest CT.  07/26/2016 chest radiograph. FINDINGS: Cardiovascular: Top-normal heart size . No significant pericardial fluid/thickening. Right internal jugular dialysis catheter terminates at the cavoatrial junction. Atherosclerotic nonaneurysmal thoracic aorta. Stable dilated  pulmonary arteries (main pulmonary artery diameter 3.4 cm). Mediastinum/Nodes: Stable heterogeneous thyroid gland with possible confluent small hypodense thyroid nodules. 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 No pathologically enlarged axillary, mediastinal or gross hilar lymph nodes, noting limited sensitivity for the detection of hilar adenopathy on this noncontrast study. Lungs/Pleura: No pneumothorax. New trace dependent bilateral pleural effusions. Stable postsurgical changes from wedge resection in the left upper and left lower lobes. Mild centrilobular and paraseptal emphysema with mild diffuse bronchial wall thickening. No acute consolidative airspace disease or new significant pulmonary nodules. Stable parenchymal band in the posterior right upper lobe with associated distortion, compatible with postinfectious/postinflammatory scarring. Stable chronic thickening of the peribronchovascular interstitium throughout both lungs with associated chronic mild distortion. Scattered irregular pulmonary nodules in the right lung are unchanged in the interval and not convincingly changed back to 12/04/2009 chest CT, compatible with benign inflammatory nodules. For  example a 1.5 x 1.0 cm peripheral right upper lobe nodule (series 4/ image 41), measuring 1.5 x 1.0 cm on 06/20/2016 and 1.5 x 0.9 cm on 12/04/2009. Partially cystic irregular right lower lobe 2.2 x 1.7 cm nodule, measuring 2.3 x 1.8 cm on 06/20/2016 and 2.3 x 1.9 cm on 12/04/2009. Upper abdomen: Unremarkable. Musculoskeletal: No aggressive appearing focal osseous lesions. Mild thoracic spondylosis. New mild anasarca. IMPRESSION: 1. New mild anasarca and trace dependent bilateral pleural effusions. Top-normal heart size. 2. Scattered irregular pulmonary nodules in the right lung are stable in the interval and not convincingly changed back to 2011, compatible  with benign inflammatory nodules given the results of the 2010 surgical left lung biopsy. 3. Stable postsurgical and postinflammatory scarring in both lungs. No acute pulmonary disease. 4. Mild emphysema with mild diffuse bronchial wall thickening, suggesting COPD. 5. Aortic atherosclerosis. 6. Stable dilated main pulmonary artery, suggesting pulmonary arterial hypertension. Electronically Signed   By: Ilona Sorrel M.D.   On: 07/29/2016 15:48   US Biopsy  Result Date: 07/21/2016 INDICATION: Acute renal failure EXAM: ULTRASOUND-GUIDED BIOPSY OF THE RENAL CORTEX.  RANDOM CORE. MEDICATIONS: None. ANESTHESIA/SEDATION: Fentanyl 50 mcg IV; Versed 1 mg IV Moderate Sedation Time:  10 The patient was continuously monitored during the procedure by the interventional radiology nurse under my direct supervision. FLUOROSCOPY TIME:  None. COMPLICATIONS: None immediate. PROCEDURE: Informed written consent was obtained from the patient after a thorough discussion of the procedural risks, benefits and alternatives. All questions were addressed. Maximal Sterile Barrier Technique was utilized including caps, mask, sterile gowns, sterile gloves, sterile drape, hand hygiene and skin antiseptic. A timeout was performed prior to the initiation of the procedure. The back was prepped with ChloraPrep in a sterile fashion, and a sterile drape was applied covering the operative field. A sterile gown and sterile gloves were used for the procedure. Under sonographic guidance, 2 16 gauge core biopsies of the cortex of the lower pole of the left kidney were obtained. Final imaging was performed. Patient tolerated the procedure well without complication. Vital sign monitoring by nursing staff during the procedure will continue as patient is in the special procedures unit for post procedure observation. FINDINGS: The images document guide needle placement within the left lower pole renal cortex. Post biopsy images demonstrate no evidence of  hemorrhage. IMPRESSION: Successful ultrasound-guided random renal cortex core biopsy. Electronically Signed   By: Marybelle Killings M.D.   On: 07/21/2016 14:36   Ir Fluoro Guide Cv Line Right  Result Date: 07/20/2016 INDICATION: End-stage renal disease. In need of durable intravenous access for the initiation of dialysis. EXAM: TUNNELED CENTRAL VENOUS HEMODIALYSIS CATHETER PLACEMENT WITH ULTRASOUND AND FLUOROSCOPIC GUIDANCE MEDICATIONS: Ancef 2 gm IV . The antibiotic was given in an appropriate time interval prior to skin puncture. ANESTHESIA/SEDATION: Versed 1 mg IV; Fentanyl 50 mcg IV; Moderate Sedation Time:  17 minutes The patient was continuously monitored during the procedure by the interventional radiology nurse under my direct supervision. FLUOROSCOPY TIME:  Fluoroscopy Time: 12 seconds (2 mGy). COMPLICATIONS: None immediate. PROCEDURE: Informed written consent was obtained from the patient after a discussion of the risks, benefits, and alternatives to treatment. Questions regarding the procedure were encouraged and answered. The right neck and chest were prepped with chlorhexidine in a sterile fashion, and a sterile drape was applied covering the operative field. Maximum barrier sterile technique with sterile gowns and gloves were used for the procedure. A timeout was performed prior to the initiation of the procedure. After creating  a small venotomy incision, a micropuncture kit was utilized to access the right internal jugular vein under direct, real-time ultrasound guidance after the overlying soft tissues were anesthetized with 1% lidocaine with epinephrine. Ultrasound image documentation was performed. The microwire was kinked to measure appropriate catheter length. A stiff Glidewire was advanced to the level of the IVC and the micropuncture sheath was exchanged for a peel-away sheath. A Palindrome tunneled hemodialysis catheter measuring 19 cm from tip to cuff was tunneled in a retrograde fashion from  the anterior chest wall to the venotomy incision. The catheter was then placed through the peel-away sheath with tips ultimately positioned within the superior aspect of the right atrium. Final catheter positioning was confirmed and documented with a spot radiographic image. The catheter aspirates and flushes normally. The catheter was flushed with appropriate volume heparin dwells. The catheter exit site was secured with a 0-Prolene retention suture. The venotomy incision was closed with an interrupted 4-0 Vicryl, Dermabond and Steri-strips. Dressings were applied. The patient tolerated the procedure well without immediate post procedural complication. IMPRESSION: Successful placement of 19 cm tip to cuff tunneled hemodialysis catheter via the right internal jugular vein with tips terminating within the superior aspect of the right atrium. The catheter is ready for immediate use. Electronically Signed   By: [name] M.D.   On: 07/20/2016 17:12   Ir US Guide Vasc Access Right  Result Date: 07/20/2016 INDICATION: End-stage renal disease. In need of durable intravenous access for the initiation of dialysis. EXAM: TUNNELED CENTRAL VENOUS HEMODIALYSIS CATHETER PLACEMENT WITH ULTRASOUND AND FLUOROSCOPIC GUIDANCE MEDICATIONS: Ancef 2 gm IV . The antibiotic was given in an appropriate time interval prior to skin puncture. ANESTHESIA/SEDATION: Versed 1 mg IV; Fentanyl 50 mcg IV; Moderate Sedation Time:  17 minutes The patient was continuously monitored during the procedure by the interventional radiology nurse under my direct supervision. FLUOROSCOPY TIME:  Fluoroscopy Time: 12 seconds (2 mGy). COMPLICATIONS: None immediate. PROCEDURE: Informed written consent was obtained from the patient after a discussion of the risks, benefits, and alternatives to treatment. Questions regarding the procedure were encouraged and answered. The right neck and chest were prepped with chlorhexidine in a sterile fashion, and a sterile  drape was applied covering the operative field. Maximum barrier sterile technique with sterile gowns and gloves were used for the procedure. A timeout was performed prior to the initiation of the procedure. After creating a small venotomy incision, a micropuncture kit was utilized to access the right internal jugular vein under direct, real-time ultrasound guidance after the overlying soft tissues were anesthetized with 1% lidocaine with epinephrine. Ultrasound image documentation was performed. The microwire was kinked to measure appropriate catheter length. A stiff Glidewire was advanced to the level of the IVC and the micropuncture sheath was exchanged for a peel-away sheath. A Palindrome tunneled hemodialysis catheter measuring 19 cm from tip to cuff was tunneled in a retrograde fashion from the anterior chest wall to the venotomy incision. The catheter was then placed through the peel-away sheath with tips ultimately positioned within the superior aspect of the right atrium. Final catheter positioning was confirmed and documented with a spot radiographic image. The catheter aspirates and flushes normally. The catheter was flushed with appropriate volume heparin dwells. The catheter exit site was secured with a 0-Prolene retention suture. The venotomy incision was closed with an interrupted 4-0 Vicryl, Dermabond and Steri-strips. Dressings were applied. The patient tolerated the procedure well without immediate post procedural complication. IMPRESSION: Successful placement of 19 cm  tip to cuff tunneled hemodialysis catheter via the right internal jugular vein with tips terminating within the superior aspect of the right atrium. The catheter is ready for immediate use. Electronically Signed   By: [name] M.D.   On: 07/20/2016 17:12   Dg Chest Port 1 View  Result Date: 07/26/2016 CLINICAL DATA:  Pulmonary nodules EXAM: PORTABLE CHEST 1 VIEW COMPARISON:  06/19/2016, chest CT 06/20/2016 FINDINGS: Right  dialysis catheter is in place with the tip at the cavoatrial junction. Postoperative changes on the left. Areas of scarring and nodularity in the left lung are stable. Decreasing airspace opacities in the right lung with some residual areas of nodularity. Heart is borderline in size. No visible effusions. IMPRESSION: Stable scarring and nodularity in the left lung with postoperative changes. Improving areas of nodularity in the right lung. Electronically Signed   By: Rolm Baptise M.D.   On: 07/26/2016 16:30   Ct Maxillofacial Wo Contrast  Result Date: 07/20/2016 CLINICAL DATA:  71 y/o  F; recurrent sinusitis. EXAM: CT MAXILLOFACIAL WITHOUT CONTRAST TECHNIQUE: Multidetector CT imaging of the maxillofacial structures was performed. Multiplanar CT image reconstructions were also generated. A small metallic BB was placed on the right temple in order to reliably differentiate right from left. COMPARISON:  03/13/2016 MRI head.  12/02/2015 CT head. FINDINGS: Frontal sinus: Normally aerated. Patent frontal sinus drainage pathways. Ethmoid sinus: Normally aerated. Maxillary sinuses:  Normally aerated. Sphenoid sinus: Normally aerated. Patent sphenoid ethmoidal recesses. Right ostiomeatal unit:  Patent. Left ostiomeatal unit:  Patent. Nasal passages: Patent. Intact nasal septum. Mild leftward nasal septal deviation and small spur. Other: Orbits and intracranial compartment are unremarkable. Mastoid air cells are normally pneumatized. IMPRESSION: Normally aerated paranasal sinuses and patent sinus drainage pathways. Electronically Signed   By: Kristine Garbe M.D.   On: 07/20/2016 14:22      Subjective: Denies complaints. Anxious and insisting on going home today. No complaints reported. No bleeding reported.  Discharge Exam:  Vitals:   08/13/16 1700 08/13/16 2101 08/14/16 0610 08/14/16 1100  BP: 123/78 127/79 138/61 129/81  Pulse: 91 82 79 79  Resp: _0 Temp: 98.6 F (37 C) 98.7 F (37.1  C) 98.5 F (36.9 C) 98.4 F (36.9 C)  TempSrc: Oral Oral Oral Oral  SpO2: 100% 98% 98% 96%  Weight:      Height:        Gen: Pleasant elderly female ambulating comfortably in the room. HEENT: moist mucosa,  rt temp HD catheter, atraumatic, normocephalic Chest: clear b/l, no wheezes, equal chest rise. No increased work of breathing. CVS: N S1&S2, no murmurs, no pedal edema or JVD. Not on telemetry. GI: soft, NT, ND, Musculoskeletal: warm, no edema CNS: Alert and oriented. No focal neurological deficits.    The results of significant diagnostics from this hospitalization (including imaging, microbiology, ancillary and laboratory) are listed below for reference.     Microbiology: No results found for this or any previous visit (from the past 240 hour(s)).   Labs: CBC:  Recent Labs Lab 08/10/16 0654 08/11/16 0531 08/12/16 0510 08/13/16 0236 08/14/16 0347  WBC 3.8* 5.0 4.3 4.6 3.7*  HGB 11.2* 11.1* 11.7* 10.6* 11.9*  HCT 34.1* 34.1* 36.5 32.8* 37.1  MCV 94.5 94.5 94.6 94.8 96.6  PLT 41* 52* 53* 27* 61*   Basic Metabolic Panel:  Recent Labs Lab 08/10/16 0654 08/11/16 0531 08/12/16 0510 08/13/16 0236 08/14/16 0347  NA 136 136 135 137 137  K 4.9 4.0 3.7 4.1  4.0  CL 100* 99* 97* 99* 97*  CO2 _0 GLUCOSE 102* 78 98 76 123*  BUN 13 22* 11 23* 13  CREATININE 3.73* 5.38* 3.70* 5.30* 3.64*  CALCIUM 8.0* 8.6* 8.0* 8.2* 8.4*  PHOS 3.0 2.3* 2.7 3.7 3.4   Liver Function Tests:  Recent Labs Lab 08/10/16 0654 08/11/16 0531 08/12/16 0510 08/13/16 0236 08/14/16 0347  ALBUMIN 3.5 3.6 3.6 3.2* 3.3*    Urinalysis    Component Value Date/Time   COLORURINE STRAW (A) 06/20/2016 0129   APPEARANCEUR CLEAR 06/20/2016 0129   LABSPEC 1.008 06/20/2016 0129   PHURINE 7.0 06/20/2016 0129   GLUCOSEU NEGATIVE 06/20/2016 0129   GLUCOSEU NEGATIVE 06/03/2014 1038   HGBUR MODERATE (A) 06/20/2016 0129   BILIRUBINUR NEGATIVE 06/20/2016 0129   KETONESUR NEGATIVE  06/20/2016 0129   PROTEINUR NEGATIVE 06/20/2016 0129   UROBILINOGEN 0.2 06/03/2014 1038   NITRITE NEGATIVE 06/20/2016 0129   LEUKOCYTESUR TRACE (A) 06/20/2016 0129      Time coordinating discharge: Over 30 minutes  SIGNED:  Vernell Leep, MD, FACP, [place]. [facility] Pager [phone]  If 7PM-7AM, please contact night-coverage [email or web address] Password [email or web address] 08/14/2016, 1:31 PM

## 2016-08-14 NOTE — Progress Notes (Signed)
Pt was just discharged home. Daughter arrived and this nurse helped assist pt to the car. Vitals WNL. IV was removed and paperwork was gone over with day shift nurse.   Eleanora Neighbor, RN

## 2016-08-14 NOTE — Progress Notes (Signed)
Subjective: Interval History: has no complaint, ready to go.  Objective: Vital signs in last 24 hours: Temp:  [97.6 F (36.4 C)-98.7 F (37.1 C)] 98.5 F (36.9 C) (04/08 0610) Pulse Rate:  [73-96] 79 (04/08 0610) Resp:  [16-18] 16 (04/08 0610) BP: (105-156)/(61-90) 138/61 (04/08 0610) SpO2:  [98 %-100 %] 98 % (04/08 0610) Weight:  [47 kg (103 lb 9.9 oz)] 47 kg (103 lb 9.9 oz) (04/07 1201) Weight change: -0.015 kg (-0.5 oz)  Intake/Output from previous day: 04/07 0701 - 04/08 0700 In: 480 [P.O.:480] Out: 2208  Intake/Output this shift: No intake/output data recorded.  General appearance: alert, cooperative and no distress Resp: clear to auscultation bilaterally Chest wall: RIJ PC Cardio: S1, S2 normal and systolic murmur: systolic ejection 2/6, decrescendo at 2nd left intercostal space GI: soft, pos bs, nontender Extremities: extremities normal, atraumatic, no cyanosis or edema  Lab Results:  Recent Labs  08/13/16 0236 08/14/16 0347  WBC 4.6 3.7*  HGB 10.6* 11.9*  HCT 32.8* 37.1  PLT 27* 61*   BMET:  Recent Labs  08/13/16 0236 08/14/16 0347  NA 137 137  K 4.1 4.0  CL 99* 97*  CO2 24 27  GLUCOSE 76 123*  BUN 23* 13  CREATININE 5.30* 3.64*  CALCIUM 8.2* 8.4*   No results for input(s): PTH in the last 72 hours. Iron Studies: No results for input(s): IRON, TIBC, TRANSFERRIN, FERRITIN in the last 72 hours.  Studies/Results: No results found.  I have reviewed the patient's current medications.  Assessment/Plan: 1 ESRD to be D/C .  HD at [facility] . Once Ptlt ok, access 2 HTN controlled 3 Anemia esa/Fe 4 HPTH vit D 5 Anca/AntiGBM resume CTX infuture, cont pred P D/C F/U [place]    LOS: 25 days   [name] L 08/14/2016,10:57 AM

## 2016-08-15 DIAGNOSIS — R531 Weakness: Secondary | ICD-10-CM | POA: Diagnosis not present

## 2016-08-15 DIAGNOSIS — N186 End stage renal disease: Secondary | ICD-10-CM | POA: Diagnosis not present

## 2016-08-15 DIAGNOSIS — L959 Vasculitis limited to the skin, unspecified: Secondary | ICD-10-CM | POA: Diagnosis not present

## 2016-08-15 DIAGNOSIS — M310 Hypersensitivity angiitis: Secondary | ICD-10-CM | POA: Diagnosis not present

## 2016-08-15 DIAGNOSIS — I120 Hypertensive chronic kidney disease with stage 5 chronic kidney disease or end stage renal disease: Secondary | ICD-10-CM | POA: Diagnosis not present

## 2016-08-15 DIAGNOSIS — D631 Anemia in chronic kidney disease: Secondary | ICD-10-CM | POA: Diagnosis not present

## 2016-08-16 DIAGNOSIS — N2581 Secondary hyperparathyroidism of renal origin: Secondary | ICD-10-CM | POA: Diagnosis not present

## 2016-08-16 DIAGNOSIS — N186 End stage renal disease: Secondary | ICD-10-CM | POA: Diagnosis not present

## 2016-08-17 DIAGNOSIS — L959 Vasculitis limited to the skin, unspecified: Secondary | ICD-10-CM | POA: Diagnosis not present

## 2016-08-17 DIAGNOSIS — I120 Hypertensive chronic kidney disease with stage 5 chronic kidney disease or end stage renal disease: Secondary | ICD-10-CM | POA: Diagnosis not present

## 2016-08-17 DIAGNOSIS — R531 Weakness: Secondary | ICD-10-CM | POA: Diagnosis not present

## 2016-08-17 DIAGNOSIS — N186 End stage renal disease: Secondary | ICD-10-CM | POA: Diagnosis not present

## 2016-08-17 DIAGNOSIS — M310 Hypersensitivity angiitis: Secondary | ICD-10-CM | POA: Diagnosis not present

## 2016-08-17 DIAGNOSIS — D631 Anemia in chronic kidney disease: Secondary | ICD-10-CM | POA: Diagnosis not present

## 2016-08-18 ENCOUNTER — Encounter (HOSPITAL_COMMUNITY): Payer: Self-pay

## 2016-08-18 DIAGNOSIS — N2581 Secondary hyperparathyroidism of renal origin: Secondary | ICD-10-CM | POA: Diagnosis not present

## 2016-08-18 DIAGNOSIS — N186 End stage renal disease: Secondary | ICD-10-CM | POA: Diagnosis not present

## 2016-08-20 DIAGNOSIS — N186 End stage renal disease: Secondary | ICD-10-CM | POA: Diagnosis not present

## 2016-08-20 DIAGNOSIS — N2581 Secondary hyperparathyroidism of renal origin: Secondary | ICD-10-CM | POA: Diagnosis not present

## 2016-08-22 DIAGNOSIS — I120 Hypertensive chronic kidney disease with stage 5 chronic kidney disease or end stage renal disease: Secondary | ICD-10-CM | POA: Diagnosis not present

## 2016-08-22 DIAGNOSIS — R531 Weakness: Secondary | ICD-10-CM | POA: Diagnosis not present

## 2016-08-22 DIAGNOSIS — D631 Anemia in chronic kidney disease: Secondary | ICD-10-CM | POA: Diagnosis not present

## 2016-08-22 DIAGNOSIS — M310 Hypersensitivity angiitis: Secondary | ICD-10-CM | POA: Diagnosis not present

## 2016-08-22 DIAGNOSIS — N186 End stage renal disease: Secondary | ICD-10-CM | POA: Diagnosis not present

## 2016-08-22 DIAGNOSIS — L959 Vasculitis limited to the skin, unspecified: Secondary | ICD-10-CM | POA: Diagnosis not present

## 2016-08-23 DIAGNOSIS — N2581 Secondary hyperparathyroidism of renal origin: Secondary | ICD-10-CM | POA: Diagnosis not present

## 2016-08-23 DIAGNOSIS — N186 End stage renal disease: Secondary | ICD-10-CM | POA: Diagnosis not present

## 2016-08-24 DIAGNOSIS — N019 Rapidly progressive nephritic syndrome with unspecified morphologic changes: Secondary | ICD-10-CM | POA: Diagnosis not present

## 2016-08-24 DIAGNOSIS — Z09 Encounter for follow-up examination after completed treatment for conditions other than malignant neoplasm: Secondary | ICD-10-CM | POA: Diagnosis not present

## 2016-08-24 DIAGNOSIS — I776 Arteritis, unspecified: Secondary | ICD-10-CM | POA: Diagnosis not present

## 2016-08-24 DIAGNOSIS — N186 End stage renal disease: Secondary | ICD-10-CM | POA: Diagnosis not present

## 2016-08-24 DIAGNOSIS — D631 Anemia in chronic kidney disease: Secondary | ICD-10-CM | POA: Diagnosis not present

## 2016-08-24 DIAGNOSIS — M310 Hypersensitivity angiitis: Secondary | ICD-10-CM | POA: Diagnosis not present

## 2016-08-24 DIAGNOSIS — I120 Hypertensive chronic kidney disease with stage 5 chronic kidney disease or end stage renal disease: Secondary | ICD-10-CM | POA: Diagnosis not present

## 2016-08-24 DIAGNOSIS — L959 Vasculitis limited to the skin, unspecified: Secondary | ICD-10-CM | POA: Diagnosis not present

## 2016-08-24 DIAGNOSIS — F5101 Primary insomnia: Secondary | ICD-10-CM | POA: Diagnosis not present

## 2016-08-24 DIAGNOSIS — R531 Weakness: Secondary | ICD-10-CM | POA: Diagnosis not present

## 2016-08-25 DIAGNOSIS — N2581 Secondary hyperparathyroidism of renal origin: Secondary | ICD-10-CM | POA: Diagnosis not present

## 2016-08-25 DIAGNOSIS — N186 End stage renal disease: Secondary | ICD-10-CM | POA: Diagnosis not present

## 2016-08-26 ENCOUNTER — Encounter (HOSPITAL_COMMUNITY): Payer: Self-pay

## 2016-08-27 DIAGNOSIS — N186 End stage renal disease: Secondary | ICD-10-CM | POA: Diagnosis not present

## 2016-08-27 DIAGNOSIS — N2581 Secondary hyperparathyroidism of renal origin: Secondary | ICD-10-CM | POA: Diagnosis not present

## 2016-08-29 DIAGNOSIS — D631 Anemia in chronic kidney disease: Secondary | ICD-10-CM | POA: Diagnosis not present

## 2016-08-29 DIAGNOSIS — N186 End stage renal disease: Secondary | ICD-10-CM | POA: Diagnosis not present

## 2016-08-29 DIAGNOSIS — I120 Hypertensive chronic kidney disease with stage 5 chronic kidney disease or end stage renal disease: Secondary | ICD-10-CM | POA: Diagnosis not present

## 2016-08-29 DIAGNOSIS — R531 Weakness: Secondary | ICD-10-CM | POA: Diagnosis not present

## 2016-08-29 DIAGNOSIS — M310 Hypersensitivity angiitis: Secondary | ICD-10-CM | POA: Diagnosis not present

## 2016-08-29 DIAGNOSIS — L959 Vasculitis limited to the skin, unspecified: Secondary | ICD-10-CM | POA: Diagnosis not present

## 2016-08-30 DIAGNOSIS — N2581 Secondary hyperparathyroidism of renal origin: Secondary | ICD-10-CM | POA: Diagnosis not present

## 2016-08-30 DIAGNOSIS — N186 End stage renal disease: Secondary | ICD-10-CM | POA: Diagnosis not present

## 2016-08-31 DIAGNOSIS — N186 End stage renal disease: Secondary | ICD-10-CM | POA: Diagnosis not present

## 2016-08-31 DIAGNOSIS — L959 Vasculitis limited to the skin, unspecified: Secondary | ICD-10-CM | POA: Diagnosis not present

## 2016-08-31 DIAGNOSIS — M310 Hypersensitivity angiitis: Secondary | ICD-10-CM | POA: Diagnosis not present

## 2016-08-31 DIAGNOSIS — I120 Hypertensive chronic kidney disease with stage 5 chronic kidney disease or end stage renal disease: Secondary | ICD-10-CM | POA: Diagnosis not present

## 2016-08-31 DIAGNOSIS — D631 Anemia in chronic kidney disease: Secondary | ICD-10-CM | POA: Diagnosis not present

## 2016-08-31 DIAGNOSIS — R531 Weakness: Secondary | ICD-10-CM | POA: Diagnosis not present

## 2016-09-01 DIAGNOSIS — N186 End stage renal disease: Secondary | ICD-10-CM | POA: Diagnosis not present

## 2016-09-01 DIAGNOSIS — N2581 Secondary hyperparathyroidism of renal origin: Secondary | ICD-10-CM | POA: Diagnosis not present

## 2016-09-01 DIAGNOSIS — I120 Hypertensive chronic kidney disease with stage 5 chronic kidney disease or end stage renal disease: Secondary | ICD-10-CM | POA: Diagnosis not present

## 2016-09-01 DIAGNOSIS — D631 Anemia in chronic kidney disease: Secondary | ICD-10-CM | POA: Diagnosis not present

## 2016-09-01 DIAGNOSIS — R531 Weakness: Secondary | ICD-10-CM | POA: Diagnosis not present

## 2016-09-03 DIAGNOSIS — N2581 Secondary hyperparathyroidism of renal origin: Secondary | ICD-10-CM | POA: Diagnosis not present

## 2016-09-03 DIAGNOSIS — N186 End stage renal disease: Secondary | ICD-10-CM | POA: Diagnosis not present

## 2016-09-05 DIAGNOSIS — I776 Arteritis, unspecified: Secondary | ICD-10-CM | POA: Diagnosis not present

## 2016-09-05 DIAGNOSIS — R531 Weakness: Secondary | ICD-10-CM | POA: Diagnosis not present

## 2016-09-05 DIAGNOSIS — Z992 Dependence on renal dialysis: Secondary | ICD-10-CM | POA: Diagnosis not present

## 2016-09-05 DIAGNOSIS — N186 End stage renal disease: Secondary | ICD-10-CM | POA: Diagnosis not present

## 2016-09-05 DIAGNOSIS — D631 Anemia in chronic kidney disease: Secondary | ICD-10-CM | POA: Diagnosis not present

## 2016-09-05 DIAGNOSIS — L959 Vasculitis limited to the skin, unspecified: Secondary | ICD-10-CM | POA: Diagnosis not present

## 2016-09-05 DIAGNOSIS — I150 Renovascular hypertension: Secondary | ICD-10-CM | POA: Diagnosis not present

## 2016-09-05 DIAGNOSIS — M310 Hypersensitivity angiitis: Secondary | ICD-10-CM | POA: Diagnosis not present

## 2016-09-05 DIAGNOSIS — I120 Hypertensive chronic kidney disease with stage 5 chronic kidney disease or end stage renal disease: Secondary | ICD-10-CM | POA: Diagnosis not present

## 2016-09-06 DIAGNOSIS — N186 End stage renal disease: Secondary | ICD-10-CM | POA: Diagnosis not present

## 2016-09-06 DIAGNOSIS — D631 Anemia in chronic kidney disease: Secondary | ICD-10-CM | POA: Diagnosis not present

## 2016-09-06 DIAGNOSIS — E876 Hypokalemia: Secondary | ICD-10-CM | POA: Diagnosis not present

## 2016-09-06 DIAGNOSIS — D689 Coagulation defect, unspecified: Secondary | ICD-10-CM | POA: Diagnosis not present

## 2016-09-06 DIAGNOSIS — N2581 Secondary hyperparathyroidism of renal origin: Secondary | ICD-10-CM | POA: Diagnosis not present

## 2016-09-06 DIAGNOSIS — D696 Thrombocytopenia, unspecified: Secondary | ICD-10-CM | POA: Diagnosis not present

## 2016-09-07 DIAGNOSIS — L959 Vasculitis limited to the skin, unspecified: Secondary | ICD-10-CM | POA: Diagnosis not present

## 2016-09-07 DIAGNOSIS — D631 Anemia in chronic kidney disease: Secondary | ICD-10-CM | POA: Diagnosis not present

## 2016-09-07 DIAGNOSIS — I120 Hypertensive chronic kidney disease with stage 5 chronic kidney disease or end stage renal disease: Secondary | ICD-10-CM | POA: Diagnosis not present

## 2016-09-07 DIAGNOSIS — R531 Weakness: Secondary | ICD-10-CM | POA: Diagnosis not present

## 2016-09-07 DIAGNOSIS — M310 Hypersensitivity angiitis: Secondary | ICD-10-CM | POA: Diagnosis not present

## 2016-09-07 DIAGNOSIS — N186 End stage renal disease: Secondary | ICD-10-CM | POA: Diagnosis not present

## 2016-09-08 DIAGNOSIS — N2581 Secondary hyperparathyroidism of renal origin: Secondary | ICD-10-CM | POA: Diagnosis not present

## 2016-09-08 DIAGNOSIS — D631 Anemia in chronic kidney disease: Secondary | ICD-10-CM | POA: Diagnosis not present

## 2016-09-08 DIAGNOSIS — D689 Coagulation defect, unspecified: Secondary | ICD-10-CM | POA: Diagnosis not present

## 2016-09-08 DIAGNOSIS — E876 Hypokalemia: Secondary | ICD-10-CM | POA: Diagnosis not present

## 2016-09-08 DIAGNOSIS — D696 Thrombocytopenia, unspecified: Secondary | ICD-10-CM | POA: Diagnosis not present

## 2016-09-08 DIAGNOSIS — N186 End stage renal disease: Secondary | ICD-10-CM | POA: Diagnosis not present

## 2016-09-10 DIAGNOSIS — N2581 Secondary hyperparathyroidism of renal origin: Secondary | ICD-10-CM | POA: Diagnosis not present

## 2016-09-10 DIAGNOSIS — E876 Hypokalemia: Secondary | ICD-10-CM | POA: Diagnosis not present

## 2016-09-10 DIAGNOSIS — N186 End stage renal disease: Secondary | ICD-10-CM | POA: Diagnosis not present

## 2016-09-10 DIAGNOSIS — D689 Coagulation defect, unspecified: Secondary | ICD-10-CM | POA: Diagnosis not present

## 2016-09-10 DIAGNOSIS — D696 Thrombocytopenia, unspecified: Secondary | ICD-10-CM | POA: Diagnosis not present

## 2016-09-10 DIAGNOSIS — D631 Anemia in chronic kidney disease: Secondary | ICD-10-CM | POA: Diagnosis not present

## 2016-09-12 ENCOUNTER — Ambulatory Visit (HOSPITAL_COMMUNITY)
Admission: RE | Admit: 2016-09-12 | Discharge: 2016-09-12 | Disposition: A | Discharge status: Home / Self Care | Payer: Medicare HMO | Source: Ambulatory Visit | Attending: Pulmonary Disease | Admitting: Pulmonary Disease

## 2016-09-12 DIAGNOSIS — R911 Solitary pulmonary nodule: Secondary | ICD-10-CM | POA: Diagnosis not present

## 2016-09-12 DIAGNOSIS — R6889 Other general symptoms and signs: Secondary | ICD-10-CM | POA: Diagnosis not present

## 2016-09-12 DIAGNOSIS — I700 Atherosclerosis of aorta: Secondary | ICD-10-CM | POA: Insufficient documentation

## 2016-09-12 DIAGNOSIS — J984 Other disorders of lung: Secondary | ICD-10-CM | POA: Diagnosis not present

## 2016-09-12 LAB — GLUCOSE, CAPILLARY: Glucose-Capillary: 73 mg/dL (ref 65–99)

## 2016-09-12 MED ORDER — FLUDEOXYGLUCOSE F - 18 (FDG) INJECTION: 5.4000 | Freq: Once | INTRAVENOUS | Status: DC | PRN

## 2016-09-13 DIAGNOSIS — D689 Coagulation defect, unspecified: Secondary | ICD-10-CM | POA: Diagnosis not present

## 2016-09-13 DIAGNOSIS — D631 Anemia in chronic kidney disease: Secondary | ICD-10-CM | POA: Diagnosis not present

## 2016-09-13 DIAGNOSIS — E876 Hypokalemia: Secondary | ICD-10-CM | POA: Diagnosis not present

## 2016-09-13 DIAGNOSIS — N186 End stage renal disease: Secondary | ICD-10-CM | POA: Diagnosis not present

## 2016-09-13 DIAGNOSIS — D696 Thrombocytopenia, unspecified: Secondary | ICD-10-CM | POA: Diagnosis not present

## 2016-09-13 DIAGNOSIS — N2581 Secondary hyperparathyroidism of renal origin: Secondary | ICD-10-CM | POA: Diagnosis not present

## 2016-09-13 NOTE — Progress Notes (Signed)
Left message for patient to contact office.

## 2016-09-14 ENCOUNTER — Telehealth: Payer: Self-pay | Admitting: Pulmonary Disease

## 2016-09-14 DIAGNOSIS — N186 End stage renal disease: Secondary | ICD-10-CM | POA: Diagnosis not present

## 2016-09-14 DIAGNOSIS — M310 Hypersensitivity angiitis: Secondary | ICD-10-CM | POA: Diagnosis not present

## 2016-09-14 DIAGNOSIS — D631 Anemia in chronic kidney disease: Secondary | ICD-10-CM | POA: Diagnosis not present

## 2016-09-14 DIAGNOSIS — L959 Vasculitis limited to the skin, unspecified: Secondary | ICD-10-CM | POA: Diagnosis not present

## 2016-09-14 DIAGNOSIS — R531 Weakness: Secondary | ICD-10-CM | POA: Diagnosis not present

## 2016-09-14 DIAGNOSIS — I120 Hypertensive chronic kidney disease with stage 5 chronic kidney disease or end stage renal disease: Secondary | ICD-10-CM | POA: Diagnosis not present

## 2016-09-14 NOTE — Progress Notes (Signed)
Spoke with the pt and scheduled ov with TP for 09/16/16

## 2016-09-14 NOTE — Telephone Encounter (Signed)
Rigoberto Noel, MD sent to Valerie Salts, CMA        Please arrange follow-up visit for TP to discuss   Spoke with the pt and scheduled ov with TP for 09/16/16

## 2016-09-15 DIAGNOSIS — E876 Hypokalemia: Secondary | ICD-10-CM | POA: Diagnosis not present

## 2016-09-15 DIAGNOSIS — D696 Thrombocytopenia, unspecified: Secondary | ICD-10-CM | POA: Diagnosis not present

## 2016-09-15 DIAGNOSIS — D689 Coagulation defect, unspecified: Secondary | ICD-10-CM | POA: Diagnosis not present

## 2016-09-15 DIAGNOSIS — N2581 Secondary hyperparathyroidism of renal origin: Secondary | ICD-10-CM | POA: Diagnosis not present

## 2016-09-15 DIAGNOSIS — N186 End stage renal disease: Secondary | ICD-10-CM | POA: Diagnosis not present

## 2016-09-15 DIAGNOSIS — D631 Anemia in chronic kidney disease: Secondary | ICD-10-CM | POA: Diagnosis not present

## 2016-09-16 ENCOUNTER — Encounter: Payer: Self-pay | Admitting: Adult Health

## 2016-09-16 ENCOUNTER — Ambulatory Visit (INDEPENDENT_AMBULATORY_CARE_PROVIDER_SITE_OTHER): Payer: Medicare HMO | Admitting: Adult Health

## 2016-09-16 DIAGNOSIS — R918 Other nonspecific abnormal finding of lung field: Secondary | ICD-10-CM | POA: Diagnosis not present

## 2016-09-16 DIAGNOSIS — N019 Rapidly progressive nephritic syndrome with unspecified morphologic changes: Secondary | ICD-10-CM | POA: Diagnosis not present

## 2016-09-16 NOTE — Assessment & Plan Note (Signed)
Now on HD - cont follow up with Renal .

## 2016-09-16 NOTE — Progress Notes (Signed)
@Patient  ID: [name], female    DOB: [birth year], 71 y.o.   MRN: [id number]  Chief Complaint  Patient presents with  . Follow-up    Nodule     Referring provider: Merrilee Seashore, MD  HPI: 71 year old female former smoker  followed for lung nodules.  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 Due to persistent fever, she underwent CT chest 06/2016  without contrast  findings suggestive of lung scarring and pulmonary nodules (right lower obe 2.1 x/ 1.5 cm and right upper lobe 15 x 10 mm) CT Chest 07/2016 >Scattered irregular pulmonary nodules in the right lung are stable in the interval and not convincingly changed back to 2011, compatible with benign inflammatory nodules given the results of the 2010 surgical left lung biopsy. -------- 2010 > fevers and night sweats in 2010, with CT scan showing indeterminate nodules, negative transrectal biopsy, underwent open lung biopsy -which showed infectious versus inflammatory etiology with carcinoid tumorlets.  Serial CT scanning was planned but the patient was lost to follow-up. She also underwent saliva gland biopsy 03/2009 which was negative for Sjogren's disease. 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 Patient has lung nodules dating back to 2010.  Recent admission in February 2018 with acute illness. A CT chest showed a 2.1 x 1.5 cm lower lobe nodule and a right upper lobe nodule measuring 15 x 10 mm. Patient was set up for a serial CT chest 07/29/2016 that showed stable nodules and not convincingly changed since 2011. Patient was set up for a PET scan that was done on 09/12/2016 that showed no abnormal hypermetabolism in the neck, chest, abdomen or pelvis. There was multifocal scarring and architectural distortion in the lungs, unchanged from 2010.  She was admitted in April for acute renal failure. She has been found to have a rapidly progressive glomerulonephritis. [name] positive vasculitis . She was treated with plasmapheresis , IV Solu-Medrol and discharged on prednisone.was unable to tolerate cytoxan . Now on dialysis (T/TH /Sat ) . She is following with renal.  Currently on prednisone 20mg  daily.  No hemoptysis .  Says breathing is doing okay. No dyspnea.    Allergies  Allergen Reactions  . Ace Inhibitors Anaphylaxis    REACTION: angioedema  . Other Anaphylaxis    Calcium Channel Blocking Agent Diltiazem Analogues  . Pneumococcal Vaccines Other (See Comments)    Red rash, Knot (sore and itches), A fever for a couple days (per pt report)  . Tetanus Toxoid Other (See Comments)    Cellulitis in arm    Immunization History  Administered Date(s) Administered  . Influenza Split 06/19/2012  . Influenza Whole 03/09/2009  . Influenza,inj,Quad PF,36+ Mos 05/28/2013, 06/03/2014  . Pneumococcal Conjugate-13 06/19/2014  . Pneumococcal Polysaccharide-23 05/23/2012    Past Medical History:  Diagnosis Date  . Acute kidney injury ([place]) 07/20/2016  . Allergic rhinitis   . Anxiety   . CAP (community acquired pneumonia) 06/2016   [name] 06/19/2016  . Cervical disc disease    [name] 07/20/2016  . Chest pain at rest 06/18/2012  . COPD  (chronic obstructive pulmonary disease) ([place]) 05/23/2012  . Diverticulosis   . DJD (degenerative joint disease)   . Esophageal diverticulum   . Esophageal stricture   . GERD (gastroesophageal reflux disease)   . Hiatal hernia   . History of blood transfusion 06/2016   "when I was hospitalized w/sepsis"  . Hyperlipidemia 05/23/2012   T. Chol 234, LDL 130   . Hypertension   . IBS (irritable bowel syndrome)   . Lung nodules    hx  with extensive workup including lung biopsy ruling out Sjogren's disease/notes 07/20/2016  . Osteopenia   . Recurrent sinusitis    [name] 07/20/2016  . Sinus headache    "daily lately" (07/20/2016)  . Traumatic arthritis     Tobacco History: History  Smoking Status  . Former Smoker  . Packs/day: 0.12  . Years: 8.00  . Types: Cigarettes  . Quit date: 05/04/1985  Smokeless Tobacco  . Never Used   Counseling given: Not Answered   Outpatient Encounter Prescriptions as of 09/16/2016  Medication Sig  . amLODipine (NORVASC) 5 MG tablet Take 5 mg by mouth daily.  [name] aspirin EC 81 MG tablet Take 81 mg by mouth daily.  [name] atorvastatin (LIPITOR) 10 MG tablet Take 1 tablet (10 mg total) by mouth daily.  . bisacodyl (DULCOLAX) 10 MG suppository Place 1 suppository (10 mg total) rectally daily as needed for moderate constipation.  . Multiple Vitamin (MULTIVITAMIN) tablet Take 1 tablet by mouth daily.  . pantoprazole (PROTONIX) 40 MG tablet Take 1 tablet (40 mg total) by mouth daily.  . polyethylene glycol (MIRALAX / GLYCOLAX) packet Take 17 g by mouth daily.  . predniSONE (DELTASONE) 20 MG tablet Take 1 tablet (20 mg total) by mouth daily with breakfast.  . sodium chloride (OCEAN) 0.65 % SOLN nasal spray Place 2 sprays into both nostrils as needed.   . sulfamethoxazole-trimethoprim (BACTRIM DS,SEPTRA DS) 800-160 MG tablet Take 1 tablet by mouth 3 (three) times a week.  . [DISCONTINUED] hydrocortisone (ANUSOL-HC) 2.5 % rectal cream Place rectally 3 (three) times  daily as needed for hemorrhoids or itching. (Patient not taking: Reported on 09/16/2016)  . [DISCONTINUED] multivitamin (RENA-VIT) TABS tablet Take 1 tablet by mouth at bedtime. (Patient not taking: Reported on 09/16/2016)  . [DISCONTINUED] Nutritional Supplements (FEEDING SUPPLEMENT, NEPRO CARB STEADY,) LIQD Take 237 mLs by mouth 2 (two) times daily between meals. (Patient not taking: Reported on 09/16/2016)  . [DISCONTINUED] senna (SENOKOT) 8.6 MG TABS tablet Take 2 tablets (17.2 mg total) by mouth daily. (Patient not taking: Reported on 09/16/2016)   Facility-Administered Encounter Medications as of 09/16/2016  Medication  . fludeoxyglucose F - 18 (FDG) injection 5.4 millicurie     Review of Systems  Constitutional:   No  weight loss, night sweats,  Fevers, chills, fatigue, or  lassitude.  HEENT:   No headaches,  Difficulty swallowing,  Tooth/dental problems, or  Sore throat,                No sneezing, itching, ear ache, nasal congestion, post nasal drip,   CV:  No chest pain,  Orthopnea, PND, swelling in lower extremities, anasarca, dizziness, palpitations, syncope.   GI  No heartburn, indigestion, abdominal pain,  nausea, vomiting, diarrhea, change in bowel habits, loss of appetite, bloody stools.   Resp: No shortness of breath with exertion or at rest.  No excess mucus, no productive cough,  No non-productive cough,  No coughing up of blood.  No change in color of mucus.  No wheezing.  No chest wall deformity  Skin: no rash or lesions.  GU: no dysuria, change in color of urine, no urgency or frequency.  No flank pain, no hematuria   MS:  No joint pain or swelling.  No decreased range of motion.  No back pain.    Physical Exam  BP 104/62 (BP Location: Left Arm, Cuff Size: Normal)   Pulse 81   Ht 5\' 3"  (1.6 m)   Wt 107 lb 3.2 oz (48.6 kg)   SpO2 98%   BMI 18.99 kg/m   GEN: A/Ox3; pleasant , NAD, well nourished    HEENT:  [place]/AT,  EACs-clear, TMs-wnl, NOSE-clear,  THROAT-clear, no lesions, no postnasal drip or exudate noted.   NECK:  Supple w/ fair ROM; no JVD; normal carotid impulses w/o bruits; no thyromegaly or nodules palpated; no lymphadenopathy.    RESP  Clear  P & A; w/o, wheezes/ rales/ or rhonchi. no accessory muscle use, no dullness to percussion  CARD:  RRR, no m/r/g, no peripheral edema, pulses intact, no cyanosis or clubbing.  GI:   Soft & nt; nml bowel sounds; no organomegaly or masses detected.   Musco: Warm bil, no deformities or joint swelling noted.   Neuro: alert, no focal deficits noted.    Skin: Warm, no lesions or rashes  Psych:  No change in mood or affect. No depression or anxiety.  No memory loss.  Lab Results:  CBC    Component Value Date/Time   WBC 3.7 (L) 08/14/2016 0347   RBC 3.84 (L) 08/14/2016 0347   HGB 11.9 (L) 08/14/2016 0347   HCT 37.1 08/14/2016 0347   PLT 61 (L) 08/14/2016 0347   MCV 96.6 08/14/2016 0347   MCH 31.0 08/14/2016 0347   MCHC 32.1 08/14/2016 0347   RDW 24.8 (H) 08/14/2016 0347   LYMPHSABS 0.9 07/20/2016 1250   MONOABS 0.8 07/20/2016 1250   EOSABS 0.4 07/20/2016 1250   BASOSABS 0.1 07/20/2016 1250    BMET    Component Value Date/Time   NA 137 08/14/2016 0347   K 4.0 08/14/2016 0347   CL 97 (L) 08/14/2016 0347   CO2 27 08/14/2016 0347   GLUCOSE 123 (H) 08/14/2016 0347   BUN 13 08/14/2016 0347   CREATININE 3.64 (H) 08/14/2016 0347   CALCIUM 8.4 (L) 08/14/2016 0347   GFRNONAA 12 (L) 08/14/2016 0347   GFRAA 13 (L) 08/14/2016 0347    BNP No results found for: BNP  ProBNP No results found for: PROBNP  Imaging: Nm Pet Image Initial (pi) Skull Base To Thigh  Result Date: 09/12/2016 CLINICAL DATA:  Initial treatment strategy for pulmonary nodules. EXAM: NUCLEAR MEDICINE PET SKULL BASE TO THIGH TECHNIQUE: 5 point for mCi F-18 FDG was injected intravenously. Full-ring PET imaging was performed from the skull base to thigh after the radiotracer. CT data was obtained and used for  attenuation correction and anatomic localization. FASTING BLOOD GLUCOSE:  Value: 07/14/1968 mg/dl COMPARISON:  CT chest 07/29/2016 and 06/20/2016.  PET 09/03/2008. FINDINGS: NECK No hypermetabolic lymph nodes in the neck. CT images show no acute findings. CHEST No hypermetabolic mediastinal, hilar or axillary lymph nodes. No abnormal hypermetabolism in the lungs. Right IJ dialysis catheter tips  are at the [facility] RA junction and in the right atrium. Heart is at the upper limits of normal in size. No pericardial or pleural effusion. Multifocal scarring and architectural distortion in the lungs bilaterally, unchanged from 09/03/2008. Post biopsy changes in the left lung is well. ABDOMEN/PELVIS No abnormal hypermetabolism in the liver, adrenal glands, spleen or pancreas. No hypermetabolic lymph nodes. Liver, gallbladder, adrenal glands, kidneys, spleen, pancreas, stomach and bowel are grossly unremarkable. Atherosclerotic calcification of the arterial vasculature without abdominal aortic aneurysm. No free fluid. SKELETON No abnormal osseous hypermetabolism. IMPRESSION: 1. No abnormal hypermetabolism in the neck, chest, abdomen or pelvis. 2. Multifocal scarring and architectural distortion in the lungs bilaterally, unchanged from 09/03/2008. 3.  Aortic atherosclerosis (ICD10-170.0). Electronically Signed   By: Lorin Picket M.D.   On: 09/12/2016 11:04     Assessment & Plan:   RPGN (rapidly progressive glomerulonephritis) Now on HD - cont follow up with Renal .    Pulmonary nodules Pulmonary nodules -stable on CT since 2010 .  PET scan not hypermetabolic .  ANCA positive vasculitis w/ renal involvement.  Does not appear to have pulmonary involvement .  follow up Dr. Elsworth Soho  In 6 months .      Rexene Edison, NP 09/16/2016

## 2016-09-16 NOTE — Patient Instructions (Signed)
Follow up with Dr. Elsworth Soho in 6 months and As needed

## 2016-09-16 NOTE — Assessment & Plan Note (Signed)
Pulmonary nodules -stable on CT since 2010 .  PET scan not hypermetabolic .  ANCA positive vasculitis w/ renal involvement.  Does not appear to have pulmonary involvement .  follow up Dr. Elsworth Soho  In 6 months .

## 2016-09-17 DIAGNOSIS — D631 Anemia in chronic kidney disease: Secondary | ICD-10-CM | POA: Diagnosis not present

## 2016-09-17 DIAGNOSIS — E876 Hypokalemia: Secondary | ICD-10-CM | POA: Diagnosis not present

## 2016-09-17 DIAGNOSIS — N2581 Secondary hyperparathyroidism of renal origin: Secondary | ICD-10-CM | POA: Diagnosis not present

## 2016-09-17 DIAGNOSIS — D696 Thrombocytopenia, unspecified: Secondary | ICD-10-CM | POA: Diagnosis not present

## 2016-09-17 DIAGNOSIS — D689 Coagulation defect, unspecified: Secondary | ICD-10-CM | POA: Diagnosis not present

## 2016-09-17 DIAGNOSIS — N186 End stage renal disease: Secondary | ICD-10-CM | POA: Diagnosis not present

## 2016-09-20 DIAGNOSIS — D689 Coagulation defect, unspecified: Secondary | ICD-10-CM | POA: Diagnosis not present

## 2016-09-20 DIAGNOSIS — D631 Anemia in chronic kidney disease: Secondary | ICD-10-CM | POA: Diagnosis not present

## 2016-09-20 DIAGNOSIS — D696 Thrombocytopenia, unspecified: Secondary | ICD-10-CM | POA: Diagnosis not present

## 2016-09-20 DIAGNOSIS — N186 End stage renal disease: Secondary | ICD-10-CM | POA: Diagnosis not present

## 2016-09-20 DIAGNOSIS — E876 Hypokalemia: Secondary | ICD-10-CM | POA: Diagnosis not present

## 2016-09-20 DIAGNOSIS — N2581 Secondary hyperparathyroidism of renal origin: Secondary | ICD-10-CM | POA: Diagnosis not present

## 2016-09-22 DIAGNOSIS — D696 Thrombocytopenia, unspecified: Secondary | ICD-10-CM | POA: Diagnosis not present

## 2016-09-22 DIAGNOSIS — D631 Anemia in chronic kidney disease: Secondary | ICD-10-CM | POA: Diagnosis not present

## 2016-09-22 DIAGNOSIS — D689 Coagulation defect, unspecified: Secondary | ICD-10-CM | POA: Diagnosis not present

## 2016-09-22 DIAGNOSIS — N186 End stage renal disease: Secondary | ICD-10-CM | POA: Diagnosis not present

## 2016-09-22 DIAGNOSIS — N2581 Secondary hyperparathyroidism of renal origin: Secondary | ICD-10-CM | POA: Diagnosis not present

## 2016-09-22 DIAGNOSIS — E876 Hypokalemia: Secondary | ICD-10-CM | POA: Diagnosis not present

## 2016-09-24 DIAGNOSIS — D689 Coagulation defect, unspecified: Secondary | ICD-10-CM | POA: Diagnosis not present

## 2016-09-24 DIAGNOSIS — N2581 Secondary hyperparathyroidism of renal origin: Secondary | ICD-10-CM | POA: Diagnosis not present

## 2016-09-24 DIAGNOSIS — D696 Thrombocytopenia, unspecified: Secondary | ICD-10-CM | POA: Diagnosis not present

## 2016-09-24 DIAGNOSIS — D631 Anemia in chronic kidney disease: Secondary | ICD-10-CM | POA: Diagnosis not present

## 2016-09-24 DIAGNOSIS — N186 End stage renal disease: Secondary | ICD-10-CM | POA: Diagnosis not present

## 2016-09-24 DIAGNOSIS — E876 Hypokalemia: Secondary | ICD-10-CM | POA: Diagnosis not present

## 2016-09-27 DIAGNOSIS — N186 End stage renal disease: Secondary | ICD-10-CM | POA: Diagnosis not present

## 2016-09-27 DIAGNOSIS — D696 Thrombocytopenia, unspecified: Secondary | ICD-10-CM | POA: Diagnosis not present

## 2016-09-27 DIAGNOSIS — D631 Anemia in chronic kidney disease: Secondary | ICD-10-CM | POA: Diagnosis not present

## 2016-09-27 DIAGNOSIS — N2581 Secondary hyperparathyroidism of renal origin: Secondary | ICD-10-CM | POA: Diagnosis not present

## 2016-09-27 DIAGNOSIS — D689 Coagulation defect, unspecified: Secondary | ICD-10-CM | POA: Diagnosis not present

## 2016-09-27 DIAGNOSIS — E876 Hypokalemia: Secondary | ICD-10-CM | POA: Diagnosis not present

## 2016-09-29 DIAGNOSIS — N2581 Secondary hyperparathyroidism of renal origin: Secondary | ICD-10-CM | POA: Diagnosis not present

## 2016-09-29 DIAGNOSIS — D696 Thrombocytopenia, unspecified: Secondary | ICD-10-CM | POA: Diagnosis not present

## 2016-09-29 DIAGNOSIS — N186 End stage renal disease: Secondary | ICD-10-CM | POA: Diagnosis not present

## 2016-09-29 DIAGNOSIS — D689 Coagulation defect, unspecified: Secondary | ICD-10-CM | POA: Diagnosis not present

## 2016-09-29 DIAGNOSIS — D631 Anemia in chronic kidney disease: Secondary | ICD-10-CM | POA: Diagnosis not present

## 2016-09-29 DIAGNOSIS — E876 Hypokalemia: Secondary | ICD-10-CM | POA: Diagnosis not present

## 2016-09-30 DIAGNOSIS — R6889 Other general symptoms and signs: Secondary | ICD-10-CM | POA: Diagnosis not present

## 2016-09-30 DIAGNOSIS — I776 Arteritis, unspecified: Secondary | ICD-10-CM | POA: Diagnosis not present

## 2016-10-01 DIAGNOSIS — D631 Anemia in chronic kidney disease: Secondary | ICD-10-CM | POA: Diagnosis not present

## 2016-10-01 DIAGNOSIS — D696 Thrombocytopenia, unspecified: Secondary | ICD-10-CM | POA: Diagnosis not present

## 2016-10-01 DIAGNOSIS — D689 Coagulation defect, unspecified: Secondary | ICD-10-CM | POA: Diagnosis not present

## 2016-10-01 DIAGNOSIS — N2581 Secondary hyperparathyroidism of renal origin: Secondary | ICD-10-CM | POA: Diagnosis not present

## 2016-10-01 DIAGNOSIS — N186 End stage renal disease: Secondary | ICD-10-CM | POA: Diagnosis not present

## 2016-10-01 DIAGNOSIS — E876 Hypokalemia: Secondary | ICD-10-CM | POA: Diagnosis not present

## 2016-10-04 DIAGNOSIS — D696 Thrombocytopenia, unspecified: Secondary | ICD-10-CM | POA: Diagnosis not present

## 2016-10-04 DIAGNOSIS — E876 Hypokalemia: Secondary | ICD-10-CM | POA: Diagnosis not present

## 2016-10-04 DIAGNOSIS — D689 Coagulation defect, unspecified: Secondary | ICD-10-CM | POA: Diagnosis not present

## 2016-10-04 DIAGNOSIS — N186 End stage renal disease: Secondary | ICD-10-CM | POA: Diagnosis not present

## 2016-10-04 DIAGNOSIS — N2581 Secondary hyperparathyroidism of renal origin: Secondary | ICD-10-CM | POA: Diagnosis not present

## 2016-10-04 DIAGNOSIS — D631 Anemia in chronic kidney disease: Secondary | ICD-10-CM | POA: Diagnosis not present

## 2016-10-06 DIAGNOSIS — D631 Anemia in chronic kidney disease: Secondary | ICD-10-CM | POA: Diagnosis not present

## 2016-10-06 DIAGNOSIS — Z992 Dependence on renal dialysis: Secondary | ICD-10-CM | POA: Diagnosis not present

## 2016-10-06 DIAGNOSIS — N2581 Secondary hyperparathyroidism of renal origin: Secondary | ICD-10-CM | POA: Diagnosis not present

## 2016-10-06 DIAGNOSIS — D689 Coagulation defect, unspecified: Secondary | ICD-10-CM | POA: Diagnosis not present

## 2016-10-06 DIAGNOSIS — I150 Renovascular hypertension: Secondary | ICD-10-CM | POA: Diagnosis not present

## 2016-10-06 DIAGNOSIS — E876 Hypokalemia: Secondary | ICD-10-CM | POA: Diagnosis not present

## 2016-10-06 DIAGNOSIS — N186 End stage renal disease: Secondary | ICD-10-CM | POA: Diagnosis not present

## 2016-10-06 DIAGNOSIS — D696 Thrombocytopenia, unspecified: Secondary | ICD-10-CM | POA: Diagnosis not present

## 2016-10-08 DIAGNOSIS — E876 Hypokalemia: Secondary | ICD-10-CM | POA: Diagnosis not present

## 2016-10-08 DIAGNOSIS — R51 Headache: Secondary | ICD-10-CM | POA: Diagnosis not present

## 2016-10-08 DIAGNOSIS — N186 End stage renal disease: Secondary | ICD-10-CM | POA: Diagnosis not present

## 2016-10-08 DIAGNOSIS — D689 Coagulation defect, unspecified: Secondary | ICD-10-CM | POA: Diagnosis not present

## 2016-10-08 DIAGNOSIS — N2581 Secondary hyperparathyroidism of renal origin: Secondary | ICD-10-CM | POA: Diagnosis not present

## 2016-10-11 DIAGNOSIS — N2581 Secondary hyperparathyroidism of renal origin: Secondary | ICD-10-CM | POA: Diagnosis not present

## 2016-10-11 DIAGNOSIS — R51 Headache: Secondary | ICD-10-CM | POA: Diagnosis not present

## 2016-10-11 DIAGNOSIS — E876 Hypokalemia: Secondary | ICD-10-CM | POA: Diagnosis not present

## 2016-10-11 DIAGNOSIS — D689 Coagulation defect, unspecified: Secondary | ICD-10-CM | POA: Diagnosis not present

## 2016-10-11 DIAGNOSIS — N186 End stage renal disease: Secondary | ICD-10-CM | POA: Diagnosis not present

## 2016-10-13 DIAGNOSIS — E876 Hypokalemia: Secondary | ICD-10-CM | POA: Diagnosis not present

## 2016-10-13 DIAGNOSIS — R51 Headache: Secondary | ICD-10-CM | POA: Diagnosis not present

## 2016-10-13 DIAGNOSIS — N2581 Secondary hyperparathyroidism of renal origin: Secondary | ICD-10-CM | POA: Diagnosis not present

## 2016-10-13 DIAGNOSIS — D689 Coagulation defect, unspecified: Secondary | ICD-10-CM | POA: Diagnosis not present

## 2016-10-13 DIAGNOSIS — N186 End stage renal disease: Secondary | ICD-10-CM | POA: Diagnosis not present

## 2016-10-15 DIAGNOSIS — E876 Hypokalemia: Secondary | ICD-10-CM | POA: Diagnosis not present

## 2016-10-15 DIAGNOSIS — R51 Headache: Secondary | ICD-10-CM | POA: Diagnosis not present

## 2016-10-15 DIAGNOSIS — N186 End stage renal disease: Secondary | ICD-10-CM | POA: Diagnosis not present

## 2016-10-15 DIAGNOSIS — N2581 Secondary hyperparathyroidism of renal origin: Secondary | ICD-10-CM | POA: Diagnosis not present

## 2016-10-15 DIAGNOSIS — D689 Coagulation defect, unspecified: Secondary | ICD-10-CM | POA: Diagnosis not present

## 2016-10-18 DIAGNOSIS — N2581 Secondary hyperparathyroidism of renal origin: Secondary | ICD-10-CM | POA: Diagnosis not present

## 2016-10-18 DIAGNOSIS — D689 Coagulation defect, unspecified: Secondary | ICD-10-CM | POA: Diagnosis not present

## 2016-10-18 DIAGNOSIS — R51 Headache: Secondary | ICD-10-CM | POA: Diagnosis not present

## 2016-10-18 DIAGNOSIS — E876 Hypokalemia: Secondary | ICD-10-CM | POA: Diagnosis not present

## 2016-10-18 DIAGNOSIS — N186 End stage renal disease: Secondary | ICD-10-CM | POA: Diagnosis not present

## 2016-10-18 NOTE — Progress Notes (Signed)
Reviewed & agree with plan  

## 2016-10-20 DIAGNOSIS — E876 Hypokalemia: Secondary | ICD-10-CM | POA: Diagnosis not present

## 2016-10-20 DIAGNOSIS — N186 End stage renal disease: Secondary | ICD-10-CM | POA: Diagnosis not present

## 2016-10-20 DIAGNOSIS — D689 Coagulation defect, unspecified: Secondary | ICD-10-CM | POA: Diagnosis not present

## 2016-10-20 DIAGNOSIS — N2581 Secondary hyperparathyroidism of renal origin: Secondary | ICD-10-CM | POA: Diagnosis not present

## 2016-10-20 DIAGNOSIS — R51 Headache: Secondary | ICD-10-CM | POA: Diagnosis not present

## 2016-10-22 DIAGNOSIS — N186 End stage renal disease: Secondary | ICD-10-CM | POA: Diagnosis not present

## 2016-10-22 DIAGNOSIS — R51 Headache: Secondary | ICD-10-CM | POA: Diagnosis not present

## 2016-10-22 DIAGNOSIS — E876 Hypokalemia: Secondary | ICD-10-CM | POA: Diagnosis not present

## 2016-10-22 DIAGNOSIS — D689 Coagulation defect, unspecified: Secondary | ICD-10-CM | POA: Diagnosis not present

## 2016-10-22 DIAGNOSIS — N2581 Secondary hyperparathyroidism of renal origin: Secondary | ICD-10-CM | POA: Diagnosis not present

## 2016-10-25 DIAGNOSIS — R51 Headache: Secondary | ICD-10-CM | POA: Diagnosis not present

## 2016-10-25 DIAGNOSIS — N186 End stage renal disease: Secondary | ICD-10-CM | POA: Diagnosis not present

## 2016-10-25 DIAGNOSIS — D689 Coagulation defect, unspecified: Secondary | ICD-10-CM | POA: Diagnosis not present

## 2016-10-25 DIAGNOSIS — N2581 Secondary hyperparathyroidism of renal origin: Secondary | ICD-10-CM | POA: Diagnosis not present

## 2016-10-25 DIAGNOSIS — E876 Hypokalemia: Secondary | ICD-10-CM | POA: Diagnosis not present

## 2016-10-27 DIAGNOSIS — N186 End stage renal disease: Secondary | ICD-10-CM | POA: Diagnosis not present

## 2016-10-27 DIAGNOSIS — D689 Coagulation defect, unspecified: Secondary | ICD-10-CM | POA: Diagnosis not present

## 2016-10-27 DIAGNOSIS — R51 Headache: Secondary | ICD-10-CM | POA: Diagnosis not present

## 2016-10-27 DIAGNOSIS — E876 Hypokalemia: Secondary | ICD-10-CM | POA: Diagnosis not present

## 2016-10-27 DIAGNOSIS — N2581 Secondary hyperparathyroidism of renal origin: Secondary | ICD-10-CM | POA: Diagnosis not present

## 2016-10-29 DIAGNOSIS — D689 Coagulation defect, unspecified: Secondary | ICD-10-CM | POA: Diagnosis not present

## 2016-10-29 DIAGNOSIS — N186 End stage renal disease: Secondary | ICD-10-CM | POA: Diagnosis not present

## 2016-10-29 DIAGNOSIS — E876 Hypokalemia: Secondary | ICD-10-CM | POA: Diagnosis not present

## 2016-10-29 DIAGNOSIS — R51 Headache: Secondary | ICD-10-CM | POA: Diagnosis not present

## 2016-10-29 DIAGNOSIS — N2581 Secondary hyperparathyroidism of renal origin: Secondary | ICD-10-CM | POA: Diagnosis not present

## 2016-11-01 DIAGNOSIS — R51 Headache: Secondary | ICD-10-CM | POA: Diagnosis not present

## 2016-11-01 DIAGNOSIS — N2581 Secondary hyperparathyroidism of renal origin: Secondary | ICD-10-CM | POA: Diagnosis not present

## 2016-11-01 DIAGNOSIS — E876 Hypokalemia: Secondary | ICD-10-CM | POA: Diagnosis not present

## 2016-11-01 DIAGNOSIS — N186 End stage renal disease: Secondary | ICD-10-CM | POA: Diagnosis not present

## 2016-11-01 DIAGNOSIS — D689 Coagulation defect, unspecified: Secondary | ICD-10-CM | POA: Diagnosis not present

## 2016-11-03 DIAGNOSIS — D689 Coagulation defect, unspecified: Secondary | ICD-10-CM | POA: Diagnosis not present

## 2016-11-03 DIAGNOSIS — N2581 Secondary hyperparathyroidism of renal origin: Secondary | ICD-10-CM | POA: Diagnosis not present

## 2016-11-03 DIAGNOSIS — N186 End stage renal disease: Secondary | ICD-10-CM | POA: Diagnosis not present

## 2016-11-03 DIAGNOSIS — E876 Hypokalemia: Secondary | ICD-10-CM | POA: Diagnosis not present

## 2016-11-03 DIAGNOSIS — R51 Headache: Secondary | ICD-10-CM | POA: Diagnosis not present

## 2016-11-05 DIAGNOSIS — D689 Coagulation defect, unspecified: Secondary | ICD-10-CM | POA: Diagnosis not present

## 2016-11-05 DIAGNOSIS — R51 Headache: Secondary | ICD-10-CM | POA: Diagnosis not present

## 2016-11-05 DIAGNOSIS — Z992 Dependence on renal dialysis: Secondary | ICD-10-CM | POA: Diagnosis not present

## 2016-11-05 DIAGNOSIS — N186 End stage renal disease: Secondary | ICD-10-CM | POA: Diagnosis not present

## 2016-11-05 DIAGNOSIS — N2581 Secondary hyperparathyroidism of renal origin: Secondary | ICD-10-CM | POA: Diagnosis not present

## 2016-11-05 DIAGNOSIS — I150 Renovascular hypertension: Secondary | ICD-10-CM | POA: Diagnosis not present

## 2016-11-05 DIAGNOSIS — E876 Hypokalemia: Secondary | ICD-10-CM | POA: Diagnosis not present

## 2016-11-07 DIAGNOSIS — Z992 Dependence on renal dialysis: Secondary | ICD-10-CM | POA: Diagnosis not present

## 2016-11-07 DIAGNOSIS — N019 Rapidly progressive nephritic syndrome with unspecified morphologic changes: Secondary | ICD-10-CM | POA: Diagnosis not present

## 2016-11-07 DIAGNOSIS — N186 End stage renal disease: Secondary | ICD-10-CM | POA: Diagnosis not present

## 2016-11-08 DIAGNOSIS — N2581 Secondary hyperparathyroidism of renal origin: Secondary | ICD-10-CM | POA: Diagnosis not present

## 2016-11-08 DIAGNOSIS — N186 End stage renal disease: Secondary | ICD-10-CM | POA: Diagnosis not present

## 2016-11-08 DIAGNOSIS — D631 Anemia in chronic kidney disease: Secondary | ICD-10-CM | POA: Diagnosis not present

## 2016-11-08 DIAGNOSIS — E876 Hypokalemia: Secondary | ICD-10-CM | POA: Diagnosis not present

## 2016-11-08 DIAGNOSIS — D689 Coagulation defect, unspecified: Secondary | ICD-10-CM | POA: Diagnosis not present

## 2016-11-10 DIAGNOSIS — D689 Coagulation defect, unspecified: Secondary | ICD-10-CM | POA: Diagnosis not present

## 2016-11-10 DIAGNOSIS — D631 Anemia in chronic kidney disease: Secondary | ICD-10-CM | POA: Diagnosis not present

## 2016-11-10 DIAGNOSIS — N186 End stage renal disease: Secondary | ICD-10-CM | POA: Diagnosis not present

## 2016-11-10 DIAGNOSIS — N2581 Secondary hyperparathyroidism of renal origin: Secondary | ICD-10-CM | POA: Diagnosis not present

## 2016-11-10 DIAGNOSIS — E876 Hypokalemia: Secondary | ICD-10-CM | POA: Diagnosis not present

## 2016-11-12 DIAGNOSIS — D631 Anemia in chronic kidney disease: Secondary | ICD-10-CM | POA: Diagnosis not present

## 2016-11-12 DIAGNOSIS — N186 End stage renal disease: Secondary | ICD-10-CM | POA: Diagnosis not present

## 2016-11-12 DIAGNOSIS — N2581 Secondary hyperparathyroidism of renal origin: Secondary | ICD-10-CM | POA: Diagnosis not present

## 2016-11-12 DIAGNOSIS — E876 Hypokalemia: Secondary | ICD-10-CM | POA: Diagnosis not present

## 2016-11-12 DIAGNOSIS — D689 Coagulation defect, unspecified: Secondary | ICD-10-CM | POA: Diagnosis not present

## 2016-11-15 DIAGNOSIS — N186 End stage renal disease: Secondary | ICD-10-CM | POA: Diagnosis not present

## 2016-11-15 DIAGNOSIS — D689 Coagulation defect, unspecified: Secondary | ICD-10-CM | POA: Diagnosis not present

## 2016-11-15 DIAGNOSIS — N2581 Secondary hyperparathyroidism of renal origin: Secondary | ICD-10-CM | POA: Diagnosis not present

## 2016-11-15 DIAGNOSIS — E876 Hypokalemia: Secondary | ICD-10-CM | POA: Diagnosis not present

## 2016-11-15 DIAGNOSIS — D631 Anemia in chronic kidney disease: Secondary | ICD-10-CM | POA: Diagnosis not present

## 2016-11-16 DIAGNOSIS — Z01818 Encounter for other preprocedural examination: Secondary | ICD-10-CM | POA: Diagnosis not present

## 2016-11-17 DIAGNOSIS — D689 Coagulation defect, unspecified: Secondary | ICD-10-CM | POA: Diagnosis not present

## 2016-11-17 DIAGNOSIS — N186 End stage renal disease: Secondary | ICD-10-CM | POA: Diagnosis not present

## 2016-11-17 DIAGNOSIS — E876 Hypokalemia: Secondary | ICD-10-CM | POA: Diagnosis not present

## 2016-11-17 DIAGNOSIS — N2581 Secondary hyperparathyroidism of renal origin: Secondary | ICD-10-CM | POA: Diagnosis not present

## 2016-11-17 DIAGNOSIS — D631 Anemia in chronic kidney disease: Secondary | ICD-10-CM | POA: Diagnosis not present

## 2016-11-19 DIAGNOSIS — E876 Hypokalemia: Secondary | ICD-10-CM | POA: Diagnosis not present

## 2016-11-19 DIAGNOSIS — D689 Coagulation defect, unspecified: Secondary | ICD-10-CM | POA: Diagnosis not present

## 2016-11-19 DIAGNOSIS — N2581 Secondary hyperparathyroidism of renal origin: Secondary | ICD-10-CM | POA: Diagnosis not present

## 2016-11-19 DIAGNOSIS — D631 Anemia in chronic kidney disease: Secondary | ICD-10-CM | POA: Diagnosis not present

## 2016-11-19 DIAGNOSIS — N186 End stage renal disease: Secondary | ICD-10-CM | POA: Diagnosis not present

## 2016-11-21 DIAGNOSIS — N186 End stage renal disease: Secondary | ICD-10-CM | POA: Diagnosis not present

## 2016-11-21 DIAGNOSIS — N2581 Secondary hyperparathyroidism of renal origin: Secondary | ICD-10-CM | POA: Diagnosis not present

## 2016-11-21 DIAGNOSIS — D631 Anemia in chronic kidney disease: Secondary | ICD-10-CM | POA: Diagnosis not present

## 2016-11-21 DIAGNOSIS — E876 Hypokalemia: Secondary | ICD-10-CM | POA: Diagnosis not present

## 2016-11-21 DIAGNOSIS — D689 Coagulation defect, unspecified: Secondary | ICD-10-CM | POA: Diagnosis not present

## 2016-11-22 DIAGNOSIS — E8770 Fluid overload, unspecified: Secondary | ICD-10-CM | POA: Diagnosis not present

## 2016-11-22 DIAGNOSIS — N186 End stage renal disease: Secondary | ICD-10-CM | POA: Diagnosis not present

## 2016-11-22 DIAGNOSIS — N019 Rapidly progressive nephritic syndrome with unspecified morphologic changes: Secondary | ICD-10-CM | POA: Diagnosis not present

## 2016-11-22 DIAGNOSIS — Z992 Dependence on renal dialysis: Secondary | ICD-10-CM | POA: Diagnosis not present

## 2016-11-22 DIAGNOSIS — Z87891 Personal history of nicotine dependence: Secondary | ICD-10-CM | POA: Diagnosis not present

## 2016-11-22 DIAGNOSIS — N189 Chronic kidney disease, unspecified: Secondary | ICD-10-CM | POA: Diagnosis not present

## 2016-11-22 DIAGNOSIS — Z79899 Other long term (current) drug therapy: Secondary | ICD-10-CM | POA: Diagnosis not present

## 2016-11-22 DIAGNOSIS — K219 Gastro-esophageal reflux disease without esophagitis: Secondary | ICD-10-CM | POA: Diagnosis not present

## 2016-11-22 DIAGNOSIS — Z7982 Long term (current) use of aspirin: Secondary | ICD-10-CM | POA: Diagnosis not present

## 2016-11-22 DIAGNOSIS — I120 Hypertensive chronic kidney disease with stage 5 chronic kidney disease or end stage renal disease: Secondary | ICD-10-CM | POA: Diagnosis not present

## 2016-11-24 DIAGNOSIS — D689 Coagulation defect, unspecified: Secondary | ICD-10-CM | POA: Diagnosis not present

## 2016-11-24 DIAGNOSIS — D631 Anemia in chronic kidney disease: Secondary | ICD-10-CM | POA: Diagnosis not present

## 2016-11-24 DIAGNOSIS — N2581 Secondary hyperparathyroidism of renal origin: Secondary | ICD-10-CM | POA: Diagnosis not present

## 2016-11-24 DIAGNOSIS — N186 End stage renal disease: Secondary | ICD-10-CM | POA: Diagnosis not present

## 2016-11-24 DIAGNOSIS — E876 Hypokalemia: Secondary | ICD-10-CM | POA: Diagnosis not present

## 2016-11-26 DIAGNOSIS — N2581 Secondary hyperparathyroidism of renal origin: Secondary | ICD-10-CM | POA: Diagnosis not present

## 2016-11-26 DIAGNOSIS — E876 Hypokalemia: Secondary | ICD-10-CM | POA: Diagnosis not present

## 2016-11-26 DIAGNOSIS — D689 Coagulation defect, unspecified: Secondary | ICD-10-CM | POA: Diagnosis not present

## 2016-11-26 DIAGNOSIS — D631 Anemia in chronic kidney disease: Secondary | ICD-10-CM | POA: Diagnosis not present

## 2016-11-26 DIAGNOSIS — N186 End stage renal disease: Secondary | ICD-10-CM | POA: Diagnosis not present

## 2016-11-29 DIAGNOSIS — N186 End stage renal disease: Secondary | ICD-10-CM | POA: Diagnosis not present

## 2016-11-29 DIAGNOSIS — D631 Anemia in chronic kidney disease: Secondary | ICD-10-CM | POA: Diagnosis not present

## 2016-11-29 DIAGNOSIS — E876 Hypokalemia: Secondary | ICD-10-CM | POA: Diagnosis not present

## 2016-11-29 DIAGNOSIS — N2581 Secondary hyperparathyroidism of renal origin: Secondary | ICD-10-CM | POA: Diagnosis not present

## 2016-11-29 DIAGNOSIS — D689 Coagulation defect, unspecified: Secondary | ICD-10-CM | POA: Diagnosis not present

## 2016-11-30 DIAGNOSIS — T8249XA Other complication of vascular dialysis catheter, initial encounter: Secondary | ICD-10-CM | POA: Diagnosis not present

## 2016-11-30 DIAGNOSIS — N186 End stage renal disease: Secondary | ICD-10-CM | POA: Diagnosis not present

## 2016-11-30 DIAGNOSIS — Z992 Dependence on renal dialysis: Secondary | ICD-10-CM | POA: Diagnosis not present

## 2016-12-01 DIAGNOSIS — E876 Hypokalemia: Secondary | ICD-10-CM | POA: Diagnosis not present

## 2016-12-01 DIAGNOSIS — N2581 Secondary hyperparathyroidism of renal origin: Secondary | ICD-10-CM | POA: Diagnosis not present

## 2016-12-01 DIAGNOSIS — D631 Anemia in chronic kidney disease: Secondary | ICD-10-CM | POA: Diagnosis not present

## 2016-12-01 DIAGNOSIS — D689 Coagulation defect, unspecified: Secondary | ICD-10-CM | POA: Diagnosis not present

## 2016-12-01 DIAGNOSIS — N186 End stage renal disease: Secondary | ICD-10-CM | POA: Diagnosis not present

## 2016-12-03 DIAGNOSIS — E876 Hypokalemia: Secondary | ICD-10-CM | POA: Diagnosis not present

## 2016-12-03 DIAGNOSIS — N2581 Secondary hyperparathyroidism of renal origin: Secondary | ICD-10-CM | POA: Diagnosis not present

## 2016-12-03 DIAGNOSIS — D689 Coagulation defect, unspecified: Secondary | ICD-10-CM | POA: Diagnosis not present

## 2016-12-03 DIAGNOSIS — D631 Anemia in chronic kidney disease: Secondary | ICD-10-CM | POA: Diagnosis not present

## 2016-12-03 DIAGNOSIS — N186 End stage renal disease: Secondary | ICD-10-CM | POA: Diagnosis not present

## 2016-12-05 DIAGNOSIS — N019 Rapidly progressive nephritic syndrome with unspecified morphologic changes: Secondary | ICD-10-CM | POA: Diagnosis not present

## 2016-12-05 DIAGNOSIS — Z09 Encounter for follow-up examination after completed treatment for conditions other than malignant neoplasm: Secondary | ICD-10-CM | POA: Diagnosis not present

## 2016-12-06 DIAGNOSIS — D631 Anemia in chronic kidney disease: Secondary | ICD-10-CM | POA: Diagnosis not present

## 2016-12-06 DIAGNOSIS — D689 Coagulation defect, unspecified: Secondary | ICD-10-CM | POA: Diagnosis not present

## 2016-12-06 DIAGNOSIS — N186 End stage renal disease: Secondary | ICD-10-CM | POA: Diagnosis not present

## 2016-12-06 DIAGNOSIS — E876 Hypokalemia: Secondary | ICD-10-CM | POA: Diagnosis not present

## 2016-12-06 DIAGNOSIS — N2581 Secondary hyperparathyroidism of renal origin: Secondary | ICD-10-CM | POA: Diagnosis not present

## 2016-12-06 DIAGNOSIS — I150 Renovascular hypertension: Secondary | ICD-10-CM | POA: Diagnosis not present

## 2016-12-06 DIAGNOSIS — Z992 Dependence on renal dialysis: Secondary | ICD-10-CM | POA: Diagnosis not present

## 2016-12-08 DIAGNOSIS — R51 Headache: Secondary | ICD-10-CM | POA: Diagnosis not present

## 2016-12-08 DIAGNOSIS — E876 Hypokalemia: Secondary | ICD-10-CM | POA: Diagnosis not present

## 2016-12-08 DIAGNOSIS — D689 Coagulation defect, unspecified: Secondary | ICD-10-CM | POA: Diagnosis not present

## 2016-12-08 DIAGNOSIS — N2581 Secondary hyperparathyroidism of renal origin: Secondary | ICD-10-CM | POA: Diagnosis not present

## 2016-12-08 DIAGNOSIS — D631 Anemia in chronic kidney disease: Secondary | ICD-10-CM | POA: Diagnosis not present

## 2016-12-08 DIAGNOSIS — N186 End stage renal disease: Secondary | ICD-10-CM | POA: Diagnosis not present

## 2016-12-10 DIAGNOSIS — D689 Coagulation defect, unspecified: Secondary | ICD-10-CM | POA: Diagnosis not present

## 2016-12-10 DIAGNOSIS — R51 Headache: Secondary | ICD-10-CM | POA: Diagnosis not present

## 2016-12-10 DIAGNOSIS — N2581 Secondary hyperparathyroidism of renal origin: Secondary | ICD-10-CM | POA: Diagnosis not present

## 2016-12-10 DIAGNOSIS — E876 Hypokalemia: Secondary | ICD-10-CM | POA: Diagnosis not present

## 2016-12-10 DIAGNOSIS — N186 End stage renal disease: Secondary | ICD-10-CM | POA: Diagnosis not present

## 2016-12-10 DIAGNOSIS — D631 Anemia in chronic kidney disease: Secondary | ICD-10-CM | POA: Diagnosis not present

## 2016-12-13 DIAGNOSIS — N2581 Secondary hyperparathyroidism of renal origin: Secondary | ICD-10-CM | POA: Diagnosis not present

## 2016-12-13 DIAGNOSIS — R51 Headache: Secondary | ICD-10-CM | POA: Diagnosis not present

## 2016-12-13 DIAGNOSIS — N186 End stage renal disease: Secondary | ICD-10-CM | POA: Diagnosis not present

## 2016-12-13 DIAGNOSIS — D689 Coagulation defect, unspecified: Secondary | ICD-10-CM | POA: Diagnosis not present

## 2016-12-13 DIAGNOSIS — D631 Anemia in chronic kidney disease: Secondary | ICD-10-CM | POA: Diagnosis not present

## 2016-12-13 DIAGNOSIS — E876 Hypokalemia: Secondary | ICD-10-CM | POA: Diagnosis not present

## 2016-12-15 DIAGNOSIS — D631 Anemia in chronic kidney disease: Secondary | ICD-10-CM | POA: Diagnosis not present

## 2016-12-15 DIAGNOSIS — E876 Hypokalemia: Secondary | ICD-10-CM | POA: Diagnosis not present

## 2016-12-15 DIAGNOSIS — N2581 Secondary hyperparathyroidism of renal origin: Secondary | ICD-10-CM | POA: Diagnosis not present

## 2016-12-15 DIAGNOSIS — N186 End stage renal disease: Secondary | ICD-10-CM | POA: Diagnosis not present

## 2016-12-15 DIAGNOSIS — D689 Coagulation defect, unspecified: Secondary | ICD-10-CM | POA: Diagnosis not present

## 2016-12-15 DIAGNOSIS — R51 Headache: Secondary | ICD-10-CM | POA: Diagnosis not present

## 2016-12-17 DIAGNOSIS — D631 Anemia in chronic kidney disease: Secondary | ICD-10-CM | POA: Diagnosis not present

## 2016-12-17 DIAGNOSIS — D689 Coagulation defect, unspecified: Secondary | ICD-10-CM | POA: Diagnosis not present

## 2016-12-17 DIAGNOSIS — E876 Hypokalemia: Secondary | ICD-10-CM | POA: Diagnosis not present

## 2016-12-17 DIAGNOSIS — N186 End stage renal disease: Secondary | ICD-10-CM | POA: Diagnosis not present

## 2016-12-17 DIAGNOSIS — R51 Headache: Secondary | ICD-10-CM | POA: Diagnosis not present

## 2016-12-17 DIAGNOSIS — N2581 Secondary hyperparathyroidism of renal origin: Secondary | ICD-10-CM | POA: Diagnosis not present

## 2016-12-19 DIAGNOSIS — T8249XA Other complication of vascular dialysis catheter, initial encounter: Secondary | ICD-10-CM | POA: Diagnosis not present

## 2016-12-19 DIAGNOSIS — D509 Iron deficiency anemia, unspecified: Secondary | ICD-10-CM | POA: Diagnosis not present

## 2016-12-19 DIAGNOSIS — Z4801 Encounter for change or removal of surgical wound dressing: Secondary | ICD-10-CM | POA: Diagnosis not present

## 2016-12-19 DIAGNOSIS — K769 Liver disease, unspecified: Secondary | ICD-10-CM | POA: Diagnosis not present

## 2016-12-19 DIAGNOSIS — D513 Other dietary vitamin B12 deficiency anemia: Secondary | ICD-10-CM | POA: Diagnosis not present

## 2016-12-19 DIAGNOSIS — R8299 Other abnormal findings in urine: Secondary | ICD-10-CM | POA: Diagnosis not present

## 2016-12-19 DIAGNOSIS — N186 End stage renal disease: Secondary | ICD-10-CM | POA: Diagnosis not present

## 2016-12-19 DIAGNOSIS — D630 Anemia in neoplastic disease: Secondary | ICD-10-CM | POA: Diagnosis not present

## 2016-12-19 DIAGNOSIS — E440 Moderate protein-calorie malnutrition: Secondary | ICD-10-CM | POA: Diagnosis not present

## 2016-12-20 DIAGNOSIS — E440 Moderate protein-calorie malnutrition: Secondary | ICD-10-CM | POA: Diagnosis not present

## 2016-12-20 DIAGNOSIS — K769 Liver disease, unspecified: Secondary | ICD-10-CM | POA: Diagnosis not present

## 2016-12-20 DIAGNOSIS — T8249XA Other complication of vascular dialysis catheter, initial encounter: Secondary | ICD-10-CM | POA: Diagnosis not present

## 2016-12-20 DIAGNOSIS — D513 Other dietary vitamin B12 deficiency anemia: Secondary | ICD-10-CM | POA: Diagnosis not present

## 2016-12-20 DIAGNOSIS — D630 Anemia in neoplastic disease: Secondary | ICD-10-CM | POA: Diagnosis not present

## 2016-12-20 DIAGNOSIS — Z4801 Encounter for change or removal of surgical wound dressing: Secondary | ICD-10-CM | POA: Diagnosis not present

## 2016-12-20 DIAGNOSIS — N186 End stage renal disease: Secondary | ICD-10-CM | POA: Diagnosis not present

## 2016-12-20 DIAGNOSIS — D509 Iron deficiency anemia, unspecified: Secondary | ICD-10-CM | POA: Diagnosis not present

## 2016-12-20 DIAGNOSIS — R8299 Other abnormal findings in urine: Secondary | ICD-10-CM | POA: Diagnosis not present

## 2016-12-21 DIAGNOSIS — T8249XA Other complication of vascular dialysis catheter, initial encounter: Secondary | ICD-10-CM | POA: Diagnosis not present

## 2016-12-21 DIAGNOSIS — D513 Other dietary vitamin B12 deficiency anemia: Secondary | ICD-10-CM | POA: Diagnosis not present

## 2016-12-21 DIAGNOSIS — K769 Liver disease, unspecified: Secondary | ICD-10-CM | POA: Diagnosis not present

## 2016-12-21 DIAGNOSIS — Z4801 Encounter for change or removal of surgical wound dressing: Secondary | ICD-10-CM | POA: Diagnosis not present

## 2016-12-21 DIAGNOSIS — D630 Anemia in neoplastic disease: Secondary | ICD-10-CM | POA: Diagnosis not present

## 2016-12-21 DIAGNOSIS — R8299 Other abnormal findings in urine: Secondary | ICD-10-CM | POA: Diagnosis not present

## 2016-12-21 DIAGNOSIS — E440 Moderate protein-calorie malnutrition: Secondary | ICD-10-CM | POA: Diagnosis not present

## 2016-12-21 DIAGNOSIS — D509 Iron deficiency anemia, unspecified: Secondary | ICD-10-CM | POA: Diagnosis not present

## 2016-12-21 DIAGNOSIS — N186 End stage renal disease: Secondary | ICD-10-CM | POA: Diagnosis not present

## 2016-12-22 DIAGNOSIS — N186 End stage renal disease: Secondary | ICD-10-CM | POA: Diagnosis not present

## 2016-12-22 DIAGNOSIS — T8249XA Other complication of vascular dialysis catheter, initial encounter: Secondary | ICD-10-CM | POA: Diagnosis not present

## 2016-12-22 DIAGNOSIS — N2581 Secondary hyperparathyroidism of renal origin: Secondary | ICD-10-CM | POA: Diagnosis not present

## 2016-12-22 DIAGNOSIS — D509 Iron deficiency anemia, unspecified: Secondary | ICD-10-CM | POA: Diagnosis not present

## 2016-12-23 DIAGNOSIS — N2581 Secondary hyperparathyroidism of renal origin: Secondary | ICD-10-CM | POA: Diagnosis not present

## 2016-12-23 DIAGNOSIS — D509 Iron deficiency anemia, unspecified: Secondary | ICD-10-CM | POA: Diagnosis not present

## 2016-12-23 DIAGNOSIS — T8249XA Other complication of vascular dialysis catheter, initial encounter: Secondary | ICD-10-CM | POA: Diagnosis not present

## 2016-12-23 DIAGNOSIS — N186 End stage renal disease: Secondary | ICD-10-CM | POA: Diagnosis not present

## 2016-12-26 DIAGNOSIS — D509 Iron deficiency anemia, unspecified: Secondary | ICD-10-CM | POA: Diagnosis not present

## 2016-12-26 DIAGNOSIS — N2581 Secondary hyperparathyroidism of renal origin: Secondary | ICD-10-CM | POA: Diagnosis not present

## 2016-12-26 DIAGNOSIS — T8249XA Other complication of vascular dialysis catheter, initial encounter: Secondary | ICD-10-CM | POA: Diagnosis not present

## 2016-12-26 DIAGNOSIS — N186 End stage renal disease: Secondary | ICD-10-CM | POA: Diagnosis not present

## 2016-12-27 DIAGNOSIS — T8249XA Other complication of vascular dialysis catheter, initial encounter: Secondary | ICD-10-CM | POA: Diagnosis not present

## 2016-12-27 DIAGNOSIS — N186 End stage renal disease: Secondary | ICD-10-CM | POA: Diagnosis not present

## 2016-12-27 DIAGNOSIS — D509 Iron deficiency anemia, unspecified: Secondary | ICD-10-CM | POA: Diagnosis not present

## 2016-12-27 DIAGNOSIS — N2581 Secondary hyperparathyroidism of renal origin: Secondary | ICD-10-CM | POA: Diagnosis not present

## 2016-12-28 DIAGNOSIS — N186 End stage renal disease: Secondary | ICD-10-CM | POA: Diagnosis not present

## 2016-12-28 DIAGNOSIS — N2581 Secondary hyperparathyroidism of renal origin: Secondary | ICD-10-CM | POA: Diagnosis not present

## 2016-12-28 DIAGNOSIS — D509 Iron deficiency anemia, unspecified: Secondary | ICD-10-CM | POA: Diagnosis not present

## 2016-12-28 DIAGNOSIS — T8249XA Other complication of vascular dialysis catheter, initial encounter: Secondary | ICD-10-CM | POA: Diagnosis not present

## 2016-12-29 DIAGNOSIS — D509 Iron deficiency anemia, unspecified: Secondary | ICD-10-CM | POA: Diagnosis not present

## 2016-12-29 DIAGNOSIS — N2581 Secondary hyperparathyroidism of renal origin: Secondary | ICD-10-CM | POA: Diagnosis not present

## 2016-12-29 DIAGNOSIS — Z992 Dependence on renal dialysis: Secondary | ICD-10-CM | POA: Diagnosis not present

## 2016-12-29 DIAGNOSIS — T8249XA Other complication of vascular dialysis catheter, initial encounter: Secondary | ICD-10-CM | POA: Diagnosis not present

## 2016-12-29 DIAGNOSIS — N186 End stage renal disease: Secondary | ICD-10-CM | POA: Diagnosis not present

## 2016-12-30 DIAGNOSIS — N186 End stage renal disease: Secondary | ICD-10-CM | POA: Diagnosis not present

## 2016-12-30 DIAGNOSIS — Z23 Encounter for immunization: Secondary | ICD-10-CM | POA: Diagnosis not present

## 2016-12-30 DIAGNOSIS — N2581 Secondary hyperparathyroidism of renal origin: Secondary | ICD-10-CM | POA: Diagnosis not present

## 2016-12-30 DIAGNOSIS — T8249XA Other complication of vascular dialysis catheter, initial encounter: Secondary | ICD-10-CM | POA: Diagnosis not present

## 2016-12-31 DIAGNOSIS — N186 End stage renal disease: Secondary | ICD-10-CM | POA: Diagnosis not present

## 2016-12-31 DIAGNOSIS — Z23 Encounter for immunization: Secondary | ICD-10-CM | POA: Diagnosis not present

## 2016-12-31 DIAGNOSIS — N2581 Secondary hyperparathyroidism of renal origin: Secondary | ICD-10-CM | POA: Diagnosis not present

## 2016-12-31 DIAGNOSIS — T8249XA Other complication of vascular dialysis catheter, initial encounter: Secondary | ICD-10-CM | POA: Diagnosis not present

## 2017-01-01 DIAGNOSIS — N2581 Secondary hyperparathyroidism of renal origin: Secondary | ICD-10-CM | POA: Diagnosis not present

## 2017-01-01 DIAGNOSIS — T8249XA Other complication of vascular dialysis catheter, initial encounter: Secondary | ICD-10-CM | POA: Diagnosis not present

## 2017-01-01 DIAGNOSIS — N186 End stage renal disease: Secondary | ICD-10-CM | POA: Diagnosis not present

## 2017-01-01 DIAGNOSIS — Z23 Encounter for immunization: Secondary | ICD-10-CM | POA: Diagnosis not present

## 2017-01-02 DIAGNOSIS — T8249XA Other complication of vascular dialysis catheter, initial encounter: Secondary | ICD-10-CM | POA: Diagnosis not present

## 2017-01-02 DIAGNOSIS — N2581 Secondary hyperparathyroidism of renal origin: Secondary | ICD-10-CM | POA: Diagnosis not present

## 2017-01-02 DIAGNOSIS — N186 End stage renal disease: Secondary | ICD-10-CM | POA: Diagnosis not present

## 2017-01-02 DIAGNOSIS — Z23 Encounter for immunization: Secondary | ICD-10-CM | POA: Diagnosis not present

## 2017-01-03 DIAGNOSIS — T8249XA Other complication of vascular dialysis catheter, initial encounter: Secondary | ICD-10-CM | POA: Diagnosis not present

## 2017-01-03 DIAGNOSIS — N186 End stage renal disease: Secondary | ICD-10-CM | POA: Diagnosis not present

## 2017-01-03 DIAGNOSIS — N2581 Secondary hyperparathyroidism of renal origin: Secondary | ICD-10-CM | POA: Diagnosis not present

## 2017-01-03 DIAGNOSIS — Z23 Encounter for immunization: Secondary | ICD-10-CM | POA: Diagnosis not present

## 2017-01-04 DIAGNOSIS — N186 End stage renal disease: Secondary | ICD-10-CM | POA: Diagnosis not present

## 2017-01-04 DIAGNOSIS — Z23 Encounter for immunization: Secondary | ICD-10-CM | POA: Diagnosis not present

## 2017-01-04 DIAGNOSIS — N2581 Secondary hyperparathyroidism of renal origin: Secondary | ICD-10-CM | POA: Diagnosis not present

## 2017-01-04 DIAGNOSIS — T8249XA Other complication of vascular dialysis catheter, initial encounter: Secondary | ICD-10-CM | POA: Diagnosis not present

## 2017-01-05 DIAGNOSIS — T8249XA Other complication of vascular dialysis catheter, initial encounter: Secondary | ICD-10-CM | POA: Diagnosis not present

## 2017-01-05 DIAGNOSIS — Z23 Encounter for immunization: Secondary | ICD-10-CM | POA: Diagnosis not present

## 2017-01-05 DIAGNOSIS — N2581 Secondary hyperparathyroidism of renal origin: Secondary | ICD-10-CM | POA: Diagnosis not present

## 2017-01-05 DIAGNOSIS — N186 End stage renal disease: Secondary | ICD-10-CM | POA: Diagnosis not present

## 2017-01-06 DIAGNOSIS — Z23 Encounter for immunization: Secondary | ICD-10-CM | POA: Diagnosis not present

## 2017-01-06 DIAGNOSIS — T8249XA Other complication of vascular dialysis catheter, initial encounter: Secondary | ICD-10-CM | POA: Diagnosis not present

## 2017-01-06 DIAGNOSIS — I150 Renovascular hypertension: Secondary | ICD-10-CM | POA: Diagnosis not present

## 2017-01-06 DIAGNOSIS — N186 End stage renal disease: Secondary | ICD-10-CM | POA: Diagnosis not present

## 2017-01-06 DIAGNOSIS — Z992 Dependence on renal dialysis: Secondary | ICD-10-CM | POA: Diagnosis not present

## 2017-01-06 DIAGNOSIS — N2581 Secondary hyperparathyroidism of renal origin: Secondary | ICD-10-CM | POA: Diagnosis not present

## 2017-01-06 DIAGNOSIS — Z452 Encounter for adjustment and management of vascular access device: Secondary | ICD-10-CM | POA: Diagnosis not present

## 2017-01-07 DIAGNOSIS — R8299 Other abnormal findings in urine: Secondary | ICD-10-CM | POA: Diagnosis not present

## 2017-01-07 DIAGNOSIS — D509 Iron deficiency anemia, unspecified: Secondary | ICD-10-CM | POA: Diagnosis not present

## 2017-01-07 DIAGNOSIS — E440 Moderate protein-calorie malnutrition: Secondary | ICD-10-CM | POA: Diagnosis not present

## 2017-01-07 DIAGNOSIS — D630 Anemia in neoplastic disease: Secondary | ICD-10-CM | POA: Diagnosis not present

## 2017-01-07 DIAGNOSIS — N186 End stage renal disease: Secondary | ICD-10-CM | POA: Diagnosis not present

## 2017-01-07 DIAGNOSIS — N2581 Secondary hyperparathyroidism of renal origin: Secondary | ICD-10-CM | POA: Diagnosis not present

## 2017-01-07 DIAGNOSIS — Z4932 Encounter for adequacy testing for peritoneal dialysis: Secondary | ICD-10-CM | POA: Diagnosis not present

## 2017-01-07 DIAGNOSIS — Z79899 Other long term (current) drug therapy: Secondary | ICD-10-CM | POA: Diagnosis not present

## 2017-01-08 DIAGNOSIS — E440 Moderate protein-calorie malnutrition: Secondary | ICD-10-CM | POA: Diagnosis not present

## 2017-01-08 DIAGNOSIS — Z79899 Other long term (current) drug therapy: Secondary | ICD-10-CM | POA: Diagnosis not present

## 2017-01-08 DIAGNOSIS — N2581 Secondary hyperparathyroidism of renal origin: Secondary | ICD-10-CM | POA: Diagnosis not present

## 2017-01-08 DIAGNOSIS — Z4932 Encounter for adequacy testing for peritoneal dialysis: Secondary | ICD-10-CM | POA: Diagnosis not present

## 2017-01-08 DIAGNOSIS — N186 End stage renal disease: Secondary | ICD-10-CM | POA: Diagnosis not present

## 2017-01-08 DIAGNOSIS — D630 Anemia in neoplastic disease: Secondary | ICD-10-CM | POA: Diagnosis not present

## 2017-01-08 DIAGNOSIS — D509 Iron deficiency anemia, unspecified: Secondary | ICD-10-CM | POA: Diagnosis not present

## 2017-01-08 DIAGNOSIS — R8299 Other abnormal findings in urine: Secondary | ICD-10-CM | POA: Diagnosis not present

## 2017-01-09 DIAGNOSIS — N186 End stage renal disease: Secondary | ICD-10-CM | POA: Diagnosis not present

## 2017-01-09 DIAGNOSIS — R8299 Other abnormal findings in urine: Secondary | ICD-10-CM | POA: Diagnosis not present

## 2017-01-09 DIAGNOSIS — Z4932 Encounter for adequacy testing for peritoneal dialysis: Secondary | ICD-10-CM | POA: Diagnosis not present

## 2017-01-09 DIAGNOSIS — D509 Iron deficiency anemia, unspecified: Secondary | ICD-10-CM | POA: Diagnosis not present

## 2017-01-09 DIAGNOSIS — E440 Moderate protein-calorie malnutrition: Secondary | ICD-10-CM | POA: Diagnosis not present

## 2017-01-09 DIAGNOSIS — N2581 Secondary hyperparathyroidism of renal origin: Secondary | ICD-10-CM | POA: Diagnosis not present

## 2017-01-09 DIAGNOSIS — Z79899 Other long term (current) drug therapy: Secondary | ICD-10-CM | POA: Diagnosis not present

## 2017-01-09 DIAGNOSIS — D630 Anemia in neoplastic disease: Secondary | ICD-10-CM | POA: Diagnosis not present

## 2017-01-10 DIAGNOSIS — N186 End stage renal disease: Secondary | ICD-10-CM | POA: Diagnosis not present

## 2017-01-10 DIAGNOSIS — E440 Moderate protein-calorie malnutrition: Secondary | ICD-10-CM | POA: Diagnosis not present

## 2017-01-10 DIAGNOSIS — N2581 Secondary hyperparathyroidism of renal origin: Secondary | ICD-10-CM | POA: Diagnosis not present

## 2017-01-10 DIAGNOSIS — R8299 Other abnormal findings in urine: Secondary | ICD-10-CM | POA: Diagnosis not present

## 2017-01-10 DIAGNOSIS — Z4932 Encounter for adequacy testing for peritoneal dialysis: Secondary | ICD-10-CM | POA: Diagnosis not present

## 2017-01-10 DIAGNOSIS — D509 Iron deficiency anemia, unspecified: Secondary | ICD-10-CM | POA: Diagnosis not present

## 2017-01-10 DIAGNOSIS — Z79899 Other long term (current) drug therapy: Secondary | ICD-10-CM | POA: Diagnosis not present

## 2017-01-10 DIAGNOSIS — D630 Anemia in neoplastic disease: Secondary | ICD-10-CM | POA: Diagnosis not present

## 2017-01-11 DIAGNOSIS — E440 Moderate protein-calorie malnutrition: Secondary | ICD-10-CM | POA: Diagnosis not present

## 2017-01-11 DIAGNOSIS — D509 Iron deficiency anemia, unspecified: Secondary | ICD-10-CM | POA: Diagnosis not present

## 2017-01-11 DIAGNOSIS — I776 Arteritis, unspecified: Secondary | ICD-10-CM | POA: Diagnosis not present

## 2017-01-11 DIAGNOSIS — Z0000 Encounter for general adult medical examination without abnormal findings: Secondary | ICD-10-CM | POA: Diagnosis not present

## 2017-01-11 DIAGNOSIS — J449 Chronic obstructive pulmonary disease, unspecified: Secondary | ICD-10-CM | POA: Diagnosis not present

## 2017-01-11 DIAGNOSIS — D630 Anemia in neoplastic disease: Secondary | ICD-10-CM | POA: Diagnosis not present

## 2017-01-11 DIAGNOSIS — E782 Mixed hyperlipidemia: Secondary | ICD-10-CM | POA: Diagnosis not present

## 2017-01-11 DIAGNOSIS — Z79899 Other long term (current) drug therapy: Secondary | ICD-10-CM | POA: Diagnosis not present

## 2017-01-11 DIAGNOSIS — N019 Rapidly progressive nephritic syndrome with unspecified morphologic changes: Secondary | ICD-10-CM | POA: Diagnosis not present

## 2017-01-11 DIAGNOSIS — N2581 Secondary hyperparathyroidism of renal origin: Secondary | ICD-10-CM | POA: Diagnosis not present

## 2017-01-11 DIAGNOSIS — R8299 Other abnormal findings in urine: Secondary | ICD-10-CM | POA: Diagnosis not present

## 2017-01-11 DIAGNOSIS — N186 End stage renal disease: Secondary | ICD-10-CM | POA: Diagnosis not present

## 2017-01-11 DIAGNOSIS — Z4932 Encounter for adequacy testing for peritoneal dialysis: Secondary | ICD-10-CM | POA: Diagnosis not present

## 2017-01-12 DIAGNOSIS — N2581 Secondary hyperparathyroidism of renal origin: Secondary | ICD-10-CM | POA: Diagnosis not present

## 2017-01-12 DIAGNOSIS — N186 End stage renal disease: Secondary | ICD-10-CM | POA: Diagnosis not present

## 2017-01-12 DIAGNOSIS — D509 Iron deficiency anemia, unspecified: Secondary | ICD-10-CM | POA: Diagnosis not present

## 2017-01-12 DIAGNOSIS — D630 Anemia in neoplastic disease: Secondary | ICD-10-CM | POA: Diagnosis not present

## 2017-01-12 DIAGNOSIS — E440 Moderate protein-calorie malnutrition: Secondary | ICD-10-CM | POA: Diagnosis not present

## 2017-01-12 DIAGNOSIS — R8299 Other abnormal findings in urine: Secondary | ICD-10-CM | POA: Diagnosis not present

## 2017-01-12 DIAGNOSIS — Z4932 Encounter for adequacy testing for peritoneal dialysis: Secondary | ICD-10-CM | POA: Diagnosis not present

## 2017-01-12 DIAGNOSIS — Z79899 Other long term (current) drug therapy: Secondary | ICD-10-CM | POA: Diagnosis not present

## 2017-01-13 DIAGNOSIS — D509 Iron deficiency anemia, unspecified: Secondary | ICD-10-CM | POA: Diagnosis not present

## 2017-01-13 DIAGNOSIS — Z4932 Encounter for adequacy testing for peritoneal dialysis: Secondary | ICD-10-CM | POA: Diagnosis not present

## 2017-01-13 DIAGNOSIS — Z79899 Other long term (current) drug therapy: Secondary | ICD-10-CM | POA: Diagnosis not present

## 2017-01-13 DIAGNOSIS — R8299 Other abnormal findings in urine: Secondary | ICD-10-CM | POA: Diagnosis not present

## 2017-01-13 DIAGNOSIS — E440 Moderate protein-calorie malnutrition: Secondary | ICD-10-CM | POA: Diagnosis not present

## 2017-01-13 DIAGNOSIS — D630 Anemia in neoplastic disease: Secondary | ICD-10-CM | POA: Diagnosis not present

## 2017-01-13 DIAGNOSIS — N186 End stage renal disease: Secondary | ICD-10-CM | POA: Diagnosis not present

## 2017-01-13 DIAGNOSIS — N2581 Secondary hyperparathyroidism of renal origin: Secondary | ICD-10-CM | POA: Diagnosis not present

## 2017-01-14 DIAGNOSIS — R8299 Other abnormal findings in urine: Secondary | ICD-10-CM | POA: Diagnosis not present

## 2017-01-14 DIAGNOSIS — E440 Moderate protein-calorie malnutrition: Secondary | ICD-10-CM | POA: Diagnosis not present

## 2017-01-14 DIAGNOSIS — Z79899 Other long term (current) drug therapy: Secondary | ICD-10-CM | POA: Diagnosis not present

## 2017-01-14 DIAGNOSIS — Z4932 Encounter for adequacy testing for peritoneal dialysis: Secondary | ICD-10-CM | POA: Diagnosis not present

## 2017-01-14 DIAGNOSIS — D509 Iron deficiency anemia, unspecified: Secondary | ICD-10-CM | POA: Diagnosis not present

## 2017-01-14 DIAGNOSIS — N2581 Secondary hyperparathyroidism of renal origin: Secondary | ICD-10-CM | POA: Diagnosis not present

## 2017-01-14 DIAGNOSIS — D630 Anemia in neoplastic disease: Secondary | ICD-10-CM | POA: Diagnosis not present

## 2017-01-14 DIAGNOSIS — N186 End stage renal disease: Secondary | ICD-10-CM | POA: Diagnosis not present

## 2017-01-15 DIAGNOSIS — Z4932 Encounter for adequacy testing for peritoneal dialysis: Secondary | ICD-10-CM | POA: Diagnosis not present

## 2017-01-15 DIAGNOSIS — N186 End stage renal disease: Secondary | ICD-10-CM | POA: Diagnosis not present

## 2017-01-15 DIAGNOSIS — N2581 Secondary hyperparathyroidism of renal origin: Secondary | ICD-10-CM | POA: Diagnosis not present

## 2017-01-15 DIAGNOSIS — Z79899 Other long term (current) drug therapy: Secondary | ICD-10-CM | POA: Diagnosis not present

## 2017-01-15 DIAGNOSIS — D630 Anemia in neoplastic disease: Secondary | ICD-10-CM | POA: Diagnosis not present

## 2017-01-15 DIAGNOSIS — D509 Iron deficiency anemia, unspecified: Secondary | ICD-10-CM | POA: Diagnosis not present

## 2017-01-15 DIAGNOSIS — E440 Moderate protein-calorie malnutrition: Secondary | ICD-10-CM | POA: Diagnosis not present

## 2017-01-15 DIAGNOSIS — R8299 Other abnormal findings in urine: Secondary | ICD-10-CM | POA: Diagnosis not present

## 2017-01-16 DIAGNOSIS — E440 Moderate protein-calorie malnutrition: Secondary | ICD-10-CM | POA: Diagnosis not present

## 2017-01-16 DIAGNOSIS — D509 Iron deficiency anemia, unspecified: Secondary | ICD-10-CM | POA: Diagnosis not present

## 2017-01-16 DIAGNOSIS — D630 Anemia in neoplastic disease: Secondary | ICD-10-CM | POA: Diagnosis not present

## 2017-01-16 DIAGNOSIS — N2581 Secondary hyperparathyroidism of renal origin: Secondary | ICD-10-CM | POA: Diagnosis not present

## 2017-01-16 DIAGNOSIS — Z79899 Other long term (current) drug therapy: Secondary | ICD-10-CM | POA: Diagnosis not present

## 2017-01-16 DIAGNOSIS — R8299 Other abnormal findings in urine: Secondary | ICD-10-CM | POA: Diagnosis not present

## 2017-01-16 DIAGNOSIS — Z4932 Encounter for adequacy testing for peritoneal dialysis: Secondary | ICD-10-CM | POA: Diagnosis not present

## 2017-01-16 DIAGNOSIS — N186 End stage renal disease: Secondary | ICD-10-CM | POA: Diagnosis not present

## 2017-01-17 DIAGNOSIS — D509 Iron deficiency anemia, unspecified: Secondary | ICD-10-CM | POA: Diagnosis not present

## 2017-01-17 DIAGNOSIS — D630 Anemia in neoplastic disease: Secondary | ICD-10-CM | POA: Diagnosis not present

## 2017-01-17 DIAGNOSIS — N2581 Secondary hyperparathyroidism of renal origin: Secondary | ICD-10-CM | POA: Diagnosis not present

## 2017-01-17 DIAGNOSIS — Z79899 Other long term (current) drug therapy: Secondary | ICD-10-CM | POA: Diagnosis not present

## 2017-01-17 DIAGNOSIS — N186 End stage renal disease: Secondary | ICD-10-CM | POA: Diagnosis not present

## 2017-01-17 DIAGNOSIS — Z4932 Encounter for adequacy testing for peritoneal dialysis: Secondary | ICD-10-CM | POA: Diagnosis not present

## 2017-01-17 DIAGNOSIS — R8299 Other abnormal findings in urine: Secondary | ICD-10-CM | POA: Diagnosis not present

## 2017-01-17 DIAGNOSIS — E440 Moderate protein-calorie malnutrition: Secondary | ICD-10-CM | POA: Diagnosis not present

## 2017-01-18 DIAGNOSIS — R8299 Other abnormal findings in urine: Secondary | ICD-10-CM | POA: Diagnosis not present

## 2017-01-18 DIAGNOSIS — Z79899 Other long term (current) drug therapy: Secondary | ICD-10-CM | POA: Diagnosis not present

## 2017-01-18 DIAGNOSIS — Z4932 Encounter for adequacy testing for peritoneal dialysis: Secondary | ICD-10-CM | POA: Diagnosis not present

## 2017-01-18 DIAGNOSIS — D630 Anemia in neoplastic disease: Secondary | ICD-10-CM | POA: Diagnosis not present

## 2017-01-18 DIAGNOSIS — E440 Moderate protein-calorie malnutrition: Secondary | ICD-10-CM | POA: Diagnosis not present

## 2017-01-18 DIAGNOSIS — N2581 Secondary hyperparathyroidism of renal origin: Secondary | ICD-10-CM | POA: Diagnosis not present

## 2017-01-18 DIAGNOSIS — N186 End stage renal disease: Secondary | ICD-10-CM | POA: Diagnosis not present

## 2017-01-18 DIAGNOSIS — D509 Iron deficiency anemia, unspecified: Secondary | ICD-10-CM | POA: Diagnosis not present

## 2017-01-19 DIAGNOSIS — E440 Moderate protein-calorie malnutrition: Secondary | ICD-10-CM | POA: Diagnosis not present

## 2017-01-19 DIAGNOSIS — Z4932 Encounter for adequacy testing for peritoneal dialysis: Secondary | ICD-10-CM | POA: Diagnosis not present

## 2017-01-19 DIAGNOSIS — D630 Anemia in neoplastic disease: Secondary | ICD-10-CM | POA: Diagnosis not present

## 2017-01-19 DIAGNOSIS — Z79899 Other long term (current) drug therapy: Secondary | ICD-10-CM | POA: Diagnosis not present

## 2017-01-19 DIAGNOSIS — R8299 Other abnormal findings in urine: Secondary | ICD-10-CM | POA: Diagnosis not present

## 2017-01-19 DIAGNOSIS — D509 Iron deficiency anemia, unspecified: Secondary | ICD-10-CM | POA: Diagnosis not present

## 2017-01-19 DIAGNOSIS — N2581 Secondary hyperparathyroidism of renal origin: Secondary | ICD-10-CM | POA: Diagnosis not present

## 2017-01-19 DIAGNOSIS — N186 End stage renal disease: Secondary | ICD-10-CM | POA: Diagnosis not present

## 2017-01-20 DIAGNOSIS — D509 Iron deficiency anemia, unspecified: Secondary | ICD-10-CM | POA: Diagnosis not present

## 2017-01-20 DIAGNOSIS — D630 Anemia in neoplastic disease: Secondary | ICD-10-CM | POA: Diagnosis not present

## 2017-01-20 DIAGNOSIS — E440 Moderate protein-calorie malnutrition: Secondary | ICD-10-CM | POA: Diagnosis not present

## 2017-01-20 DIAGNOSIS — N186 End stage renal disease: Secondary | ICD-10-CM | POA: Diagnosis not present

## 2017-01-20 DIAGNOSIS — Z79899 Other long term (current) drug therapy: Secondary | ICD-10-CM | POA: Diagnosis not present

## 2017-01-20 DIAGNOSIS — Z4932 Encounter for adequacy testing for peritoneal dialysis: Secondary | ICD-10-CM | POA: Diagnosis not present

## 2017-01-20 DIAGNOSIS — N2581 Secondary hyperparathyroidism of renal origin: Secondary | ICD-10-CM | POA: Diagnosis not present

## 2017-01-20 DIAGNOSIS — R8299 Other abnormal findings in urine: Secondary | ICD-10-CM | POA: Diagnosis not present

## 2017-01-21 DIAGNOSIS — D630 Anemia in neoplastic disease: Secondary | ICD-10-CM | POA: Diagnosis not present

## 2017-01-21 DIAGNOSIS — D509 Iron deficiency anemia, unspecified: Secondary | ICD-10-CM | POA: Diagnosis not present

## 2017-01-21 DIAGNOSIS — Z79899 Other long term (current) drug therapy: Secondary | ICD-10-CM | POA: Diagnosis not present

## 2017-01-21 DIAGNOSIS — R8299 Other abnormal findings in urine: Secondary | ICD-10-CM | POA: Diagnosis not present

## 2017-01-21 DIAGNOSIS — Z4932 Encounter for adequacy testing for peritoneal dialysis: Secondary | ICD-10-CM | POA: Diagnosis not present

## 2017-01-21 DIAGNOSIS — N186 End stage renal disease: Secondary | ICD-10-CM | POA: Diagnosis not present

## 2017-01-21 DIAGNOSIS — E440 Moderate protein-calorie malnutrition: Secondary | ICD-10-CM | POA: Diagnosis not present

## 2017-01-21 DIAGNOSIS — N2581 Secondary hyperparathyroidism of renal origin: Secondary | ICD-10-CM | POA: Diagnosis not present

## 2017-01-22 DIAGNOSIS — Z79899 Other long term (current) drug therapy: Secondary | ICD-10-CM | POA: Diagnosis not present

## 2017-01-22 DIAGNOSIS — N2581 Secondary hyperparathyroidism of renal origin: Secondary | ICD-10-CM | POA: Diagnosis not present

## 2017-01-22 DIAGNOSIS — Z4932 Encounter for adequacy testing for peritoneal dialysis: Secondary | ICD-10-CM | POA: Diagnosis not present

## 2017-01-22 DIAGNOSIS — R8299 Other abnormal findings in urine: Secondary | ICD-10-CM | POA: Diagnosis not present

## 2017-01-22 DIAGNOSIS — D630 Anemia in neoplastic disease: Secondary | ICD-10-CM | POA: Diagnosis not present

## 2017-01-22 DIAGNOSIS — N186 End stage renal disease: Secondary | ICD-10-CM | POA: Diagnosis not present

## 2017-01-22 DIAGNOSIS — D509 Iron deficiency anemia, unspecified: Secondary | ICD-10-CM | POA: Diagnosis not present

## 2017-01-22 DIAGNOSIS — E440 Moderate protein-calorie malnutrition: Secondary | ICD-10-CM | POA: Diagnosis not present

## 2017-01-23 DIAGNOSIS — N186 End stage renal disease: Secondary | ICD-10-CM | POA: Diagnosis not present

## 2017-01-23 DIAGNOSIS — D630 Anemia in neoplastic disease: Secondary | ICD-10-CM | POA: Diagnosis not present

## 2017-01-23 DIAGNOSIS — R8299 Other abnormal findings in urine: Secondary | ICD-10-CM | POA: Diagnosis not present

## 2017-01-23 DIAGNOSIS — N2581 Secondary hyperparathyroidism of renal origin: Secondary | ICD-10-CM | POA: Diagnosis not present

## 2017-01-23 DIAGNOSIS — Z4932 Encounter for adequacy testing for peritoneal dialysis: Secondary | ICD-10-CM | POA: Diagnosis not present

## 2017-01-23 DIAGNOSIS — D509 Iron deficiency anemia, unspecified: Secondary | ICD-10-CM | POA: Diagnosis not present

## 2017-01-23 DIAGNOSIS — E440 Moderate protein-calorie malnutrition: Secondary | ICD-10-CM | POA: Diagnosis not present

## 2017-01-23 DIAGNOSIS — Z79899 Other long term (current) drug therapy: Secondary | ICD-10-CM | POA: Diagnosis not present

## 2017-01-24 DIAGNOSIS — E440 Moderate protein-calorie malnutrition: Secondary | ICD-10-CM | POA: Diagnosis not present

## 2017-01-24 DIAGNOSIS — R8299 Other abnormal findings in urine: Secondary | ICD-10-CM | POA: Diagnosis not present

## 2017-01-24 DIAGNOSIS — N186 End stage renal disease: Secondary | ICD-10-CM | POA: Diagnosis not present

## 2017-01-24 DIAGNOSIS — N2581 Secondary hyperparathyroidism of renal origin: Secondary | ICD-10-CM | POA: Diagnosis not present

## 2017-01-24 DIAGNOSIS — D630 Anemia in neoplastic disease: Secondary | ICD-10-CM | POA: Diagnosis not present

## 2017-01-24 DIAGNOSIS — Z4932 Encounter for adequacy testing for peritoneal dialysis: Secondary | ICD-10-CM | POA: Diagnosis not present

## 2017-01-24 DIAGNOSIS — Z79899 Other long term (current) drug therapy: Secondary | ICD-10-CM | POA: Diagnosis not present

## 2017-01-24 DIAGNOSIS — D509 Iron deficiency anemia, unspecified: Secondary | ICD-10-CM | POA: Diagnosis not present

## 2017-01-25 DIAGNOSIS — N186 End stage renal disease: Secondary | ICD-10-CM | POA: Diagnosis not present

## 2017-01-25 DIAGNOSIS — D630 Anemia in neoplastic disease: Secondary | ICD-10-CM | POA: Diagnosis not present

## 2017-01-25 DIAGNOSIS — R8299 Other abnormal findings in urine: Secondary | ICD-10-CM | POA: Diagnosis not present

## 2017-01-25 DIAGNOSIS — N2581 Secondary hyperparathyroidism of renal origin: Secondary | ICD-10-CM | POA: Diagnosis not present

## 2017-01-25 DIAGNOSIS — D509 Iron deficiency anemia, unspecified: Secondary | ICD-10-CM | POA: Diagnosis not present

## 2017-01-25 DIAGNOSIS — Z79899 Other long term (current) drug therapy: Secondary | ICD-10-CM | POA: Diagnosis not present

## 2017-01-25 DIAGNOSIS — E440 Moderate protein-calorie malnutrition: Secondary | ICD-10-CM | POA: Diagnosis not present

## 2017-01-25 DIAGNOSIS — Z4932 Encounter for adequacy testing for peritoneal dialysis: Secondary | ICD-10-CM | POA: Diagnosis not present

## 2017-01-26 DIAGNOSIS — R6889 Other general symptoms and signs: Secondary | ICD-10-CM | POA: Diagnosis not present

## 2017-01-26 DIAGNOSIS — E440 Moderate protein-calorie malnutrition: Secondary | ICD-10-CM | POA: Diagnosis not present

## 2017-01-26 DIAGNOSIS — Z79899 Other long term (current) drug therapy: Secondary | ICD-10-CM | POA: Diagnosis not present

## 2017-01-26 DIAGNOSIS — D509 Iron deficiency anemia, unspecified: Secondary | ICD-10-CM | POA: Diagnosis not present

## 2017-01-26 DIAGNOSIS — D630 Anemia in neoplastic disease: Secondary | ICD-10-CM | POA: Diagnosis not present

## 2017-01-26 DIAGNOSIS — R8299 Other abnormal findings in urine: Secondary | ICD-10-CM | POA: Diagnosis not present

## 2017-01-26 DIAGNOSIS — Z4932 Encounter for adequacy testing for peritoneal dialysis: Secondary | ICD-10-CM | POA: Diagnosis not present

## 2017-01-26 DIAGNOSIS — N186 End stage renal disease: Secondary | ICD-10-CM | POA: Diagnosis not present

## 2017-01-26 DIAGNOSIS — N2581 Secondary hyperparathyroidism of renal origin: Secondary | ICD-10-CM | POA: Diagnosis not present

## 2017-01-27 DIAGNOSIS — E440 Moderate protein-calorie malnutrition: Secondary | ICD-10-CM | POA: Diagnosis not present

## 2017-01-27 DIAGNOSIS — N2581 Secondary hyperparathyroidism of renal origin: Secondary | ICD-10-CM | POA: Diagnosis not present

## 2017-01-27 DIAGNOSIS — R8299 Other abnormal findings in urine: Secondary | ICD-10-CM | POA: Diagnosis not present

## 2017-01-27 DIAGNOSIS — D630 Anemia in neoplastic disease: Secondary | ICD-10-CM | POA: Diagnosis not present

## 2017-01-27 DIAGNOSIS — Z4932 Encounter for adequacy testing for peritoneal dialysis: Secondary | ICD-10-CM | POA: Diagnosis not present

## 2017-01-27 DIAGNOSIS — Z79899 Other long term (current) drug therapy: Secondary | ICD-10-CM | POA: Diagnosis not present

## 2017-01-27 DIAGNOSIS — N186 End stage renal disease: Secondary | ICD-10-CM | POA: Diagnosis not present

## 2017-01-27 DIAGNOSIS — D509 Iron deficiency anemia, unspecified: Secondary | ICD-10-CM | POA: Diagnosis not present

## 2017-01-28 DIAGNOSIS — N186 End stage renal disease: Secondary | ICD-10-CM | POA: Diagnosis not present

## 2017-01-28 DIAGNOSIS — Z4932 Encounter for adequacy testing for peritoneal dialysis: Secondary | ICD-10-CM | POA: Diagnosis not present

## 2017-01-28 DIAGNOSIS — R8299 Other abnormal findings in urine: Secondary | ICD-10-CM | POA: Diagnosis not present

## 2017-01-28 DIAGNOSIS — N2581 Secondary hyperparathyroidism of renal origin: Secondary | ICD-10-CM | POA: Diagnosis not present

## 2017-01-28 DIAGNOSIS — Z79899 Other long term (current) drug therapy: Secondary | ICD-10-CM | POA: Diagnosis not present

## 2017-01-28 DIAGNOSIS — E440 Moderate protein-calorie malnutrition: Secondary | ICD-10-CM | POA: Diagnosis not present

## 2017-01-28 DIAGNOSIS — D509 Iron deficiency anemia, unspecified: Secondary | ICD-10-CM | POA: Diagnosis not present

## 2017-01-28 DIAGNOSIS — D630 Anemia in neoplastic disease: Secondary | ICD-10-CM | POA: Diagnosis not present

## 2017-01-29 DIAGNOSIS — E440 Moderate protein-calorie malnutrition: Secondary | ICD-10-CM | POA: Diagnosis not present

## 2017-01-29 DIAGNOSIS — R8299 Other abnormal findings in urine: Secondary | ICD-10-CM | POA: Diagnosis not present

## 2017-01-29 DIAGNOSIS — D630 Anemia in neoplastic disease: Secondary | ICD-10-CM | POA: Diagnosis not present

## 2017-01-29 DIAGNOSIS — N2581 Secondary hyperparathyroidism of renal origin: Secondary | ICD-10-CM | POA: Diagnosis not present

## 2017-01-29 DIAGNOSIS — N186 End stage renal disease: Secondary | ICD-10-CM | POA: Diagnosis not present

## 2017-01-29 DIAGNOSIS — Z4932 Encounter for adequacy testing for peritoneal dialysis: Secondary | ICD-10-CM | POA: Diagnosis not present

## 2017-01-29 DIAGNOSIS — D509 Iron deficiency anemia, unspecified: Secondary | ICD-10-CM | POA: Diagnosis not present

## 2017-01-29 DIAGNOSIS — Z79899 Other long term (current) drug therapy: Secondary | ICD-10-CM | POA: Diagnosis not present

## 2017-01-30 DIAGNOSIS — Z4932 Encounter for adequacy testing for peritoneal dialysis: Secondary | ICD-10-CM | POA: Diagnosis not present

## 2017-01-30 DIAGNOSIS — D509 Iron deficiency anemia, unspecified: Secondary | ICD-10-CM | POA: Diagnosis not present

## 2017-01-30 DIAGNOSIS — N2581 Secondary hyperparathyroidism of renal origin: Secondary | ICD-10-CM | POA: Diagnosis not present

## 2017-01-30 DIAGNOSIS — D630 Anemia in neoplastic disease: Secondary | ICD-10-CM | POA: Diagnosis not present

## 2017-01-30 DIAGNOSIS — E440 Moderate protein-calorie malnutrition: Secondary | ICD-10-CM | POA: Diagnosis not present

## 2017-01-30 DIAGNOSIS — Z79899 Other long term (current) drug therapy: Secondary | ICD-10-CM | POA: Diagnosis not present

## 2017-01-30 DIAGNOSIS — N186 End stage renal disease: Secondary | ICD-10-CM | POA: Diagnosis not present

## 2017-01-30 DIAGNOSIS — R8299 Other abnormal findings in urine: Secondary | ICD-10-CM | POA: Diagnosis not present

## 2017-01-31 DIAGNOSIS — N2581 Secondary hyperparathyroidism of renal origin: Secondary | ICD-10-CM | POA: Diagnosis not present

## 2017-01-31 DIAGNOSIS — R8299 Other abnormal findings in urine: Secondary | ICD-10-CM | POA: Diagnosis not present

## 2017-01-31 DIAGNOSIS — Z4932 Encounter for adequacy testing for peritoneal dialysis: Secondary | ICD-10-CM | POA: Diagnosis not present

## 2017-01-31 DIAGNOSIS — D509 Iron deficiency anemia, unspecified: Secondary | ICD-10-CM | POA: Diagnosis not present

## 2017-01-31 DIAGNOSIS — N186 End stage renal disease: Secondary | ICD-10-CM | POA: Diagnosis not present

## 2017-01-31 DIAGNOSIS — E440 Moderate protein-calorie malnutrition: Secondary | ICD-10-CM | POA: Diagnosis not present

## 2017-01-31 DIAGNOSIS — D630 Anemia in neoplastic disease: Secondary | ICD-10-CM | POA: Diagnosis not present

## 2017-01-31 DIAGNOSIS — Z79899 Other long term (current) drug therapy: Secondary | ICD-10-CM | POA: Diagnosis not present

## 2017-02-01 DIAGNOSIS — D630 Anemia in neoplastic disease: Secondary | ICD-10-CM | POA: Diagnosis not present

## 2017-02-01 DIAGNOSIS — Z4932 Encounter for adequacy testing for peritoneal dialysis: Secondary | ICD-10-CM | POA: Diagnosis not present

## 2017-02-01 DIAGNOSIS — E440 Moderate protein-calorie malnutrition: Secondary | ICD-10-CM | POA: Diagnosis not present

## 2017-02-01 DIAGNOSIS — R8299 Other abnormal findings in urine: Secondary | ICD-10-CM | POA: Diagnosis not present

## 2017-02-01 DIAGNOSIS — Z79899 Other long term (current) drug therapy: Secondary | ICD-10-CM | POA: Diagnosis not present

## 2017-02-01 DIAGNOSIS — N186 End stage renal disease: Secondary | ICD-10-CM | POA: Diagnosis not present

## 2017-02-01 DIAGNOSIS — D509 Iron deficiency anemia, unspecified: Secondary | ICD-10-CM | POA: Diagnosis not present

## 2017-02-01 DIAGNOSIS — N2581 Secondary hyperparathyroidism of renal origin: Secondary | ICD-10-CM | POA: Diagnosis not present

## 2017-02-02 DIAGNOSIS — R8299 Other abnormal findings in urine: Secondary | ICD-10-CM | POA: Diagnosis not present

## 2017-02-02 DIAGNOSIS — Z79899 Other long term (current) drug therapy: Secondary | ICD-10-CM | POA: Diagnosis not present

## 2017-02-02 DIAGNOSIS — E440 Moderate protein-calorie malnutrition: Secondary | ICD-10-CM | POA: Diagnosis not present

## 2017-02-02 DIAGNOSIS — N186 End stage renal disease: Secondary | ICD-10-CM | POA: Diagnosis not present

## 2017-02-02 DIAGNOSIS — N2581 Secondary hyperparathyroidism of renal origin: Secondary | ICD-10-CM | POA: Diagnosis not present

## 2017-02-02 DIAGNOSIS — D509 Iron deficiency anemia, unspecified: Secondary | ICD-10-CM | POA: Diagnosis not present

## 2017-02-02 DIAGNOSIS — D630 Anemia in neoplastic disease: Secondary | ICD-10-CM | POA: Diagnosis not present

## 2017-02-02 DIAGNOSIS — Z4932 Encounter for adequacy testing for peritoneal dialysis: Secondary | ICD-10-CM | POA: Diagnosis not present

## 2017-02-03 DIAGNOSIS — Z79899 Other long term (current) drug therapy: Secondary | ICD-10-CM | POA: Diagnosis not present

## 2017-02-03 DIAGNOSIS — D630 Anemia in neoplastic disease: Secondary | ICD-10-CM | POA: Diagnosis not present

## 2017-02-03 DIAGNOSIS — Z4932 Encounter for adequacy testing for peritoneal dialysis: Secondary | ICD-10-CM | POA: Diagnosis not present

## 2017-02-03 DIAGNOSIS — E440 Moderate protein-calorie malnutrition: Secondary | ICD-10-CM | POA: Diagnosis not present

## 2017-02-03 DIAGNOSIS — N186 End stage renal disease: Secondary | ICD-10-CM | POA: Diagnosis not present

## 2017-02-03 DIAGNOSIS — N2581 Secondary hyperparathyroidism of renal origin: Secondary | ICD-10-CM | POA: Diagnosis not present

## 2017-02-03 DIAGNOSIS — R8299 Other abnormal findings in urine: Secondary | ICD-10-CM | POA: Diagnosis not present

## 2017-02-03 DIAGNOSIS — D509 Iron deficiency anemia, unspecified: Secondary | ICD-10-CM | POA: Diagnosis not present

## 2017-02-04 DIAGNOSIS — N186 End stage renal disease: Secondary | ICD-10-CM | POA: Diagnosis not present

## 2017-02-04 DIAGNOSIS — Z4932 Encounter for adequacy testing for peritoneal dialysis: Secondary | ICD-10-CM | POA: Diagnosis not present

## 2017-02-04 DIAGNOSIS — R8299 Other abnormal findings in urine: Secondary | ICD-10-CM | POA: Diagnosis not present

## 2017-02-04 DIAGNOSIS — N2581 Secondary hyperparathyroidism of renal origin: Secondary | ICD-10-CM | POA: Diagnosis not present

## 2017-02-04 DIAGNOSIS — D509 Iron deficiency anemia, unspecified: Secondary | ICD-10-CM | POA: Diagnosis not present

## 2017-02-04 DIAGNOSIS — D630 Anemia in neoplastic disease: Secondary | ICD-10-CM | POA: Diagnosis not present

## 2017-02-04 DIAGNOSIS — Z79899 Other long term (current) drug therapy: Secondary | ICD-10-CM | POA: Diagnosis not present

## 2017-02-04 DIAGNOSIS — E440 Moderate protein-calorie malnutrition: Secondary | ICD-10-CM | POA: Diagnosis not present

## 2017-02-05 DIAGNOSIS — Z79899 Other long term (current) drug therapy: Secondary | ICD-10-CM | POA: Diagnosis not present

## 2017-02-05 DIAGNOSIS — D509 Iron deficiency anemia, unspecified: Secondary | ICD-10-CM | POA: Diagnosis not present

## 2017-02-05 DIAGNOSIS — Z4932 Encounter for adequacy testing for peritoneal dialysis: Secondary | ICD-10-CM | POA: Diagnosis not present

## 2017-02-05 DIAGNOSIS — E440 Moderate protein-calorie malnutrition: Secondary | ICD-10-CM | POA: Diagnosis not present

## 2017-02-05 DIAGNOSIS — N2581 Secondary hyperparathyroidism of renal origin: Secondary | ICD-10-CM | POA: Diagnosis not present

## 2017-02-05 DIAGNOSIS — D630 Anemia in neoplastic disease: Secondary | ICD-10-CM | POA: Diagnosis not present

## 2017-02-05 DIAGNOSIS — R8299 Other abnormal findings in urine: Secondary | ICD-10-CM | POA: Diagnosis not present

## 2017-02-05 DIAGNOSIS — Z992 Dependence on renal dialysis: Secondary | ICD-10-CM | POA: Diagnosis not present

## 2017-02-05 DIAGNOSIS — I150 Renovascular hypertension: Secondary | ICD-10-CM | POA: Diagnosis not present

## 2017-02-05 DIAGNOSIS — N186 End stage renal disease: Secondary | ICD-10-CM | POA: Diagnosis not present

## 2017-02-06 DIAGNOSIS — N186 End stage renal disease: Secondary | ICD-10-CM | POA: Diagnosis not present

## 2017-02-06 DIAGNOSIS — R82998 Other abnormal findings in urine: Secondary | ICD-10-CM | POA: Diagnosis not present

## 2017-02-06 DIAGNOSIS — D630 Anemia in neoplastic disease: Secondary | ICD-10-CM | POA: Diagnosis not present

## 2017-02-06 DIAGNOSIS — N2581 Secondary hyperparathyroidism of renal origin: Secondary | ICD-10-CM | POA: Diagnosis not present

## 2017-02-06 DIAGNOSIS — E7841 Elevated Lipoprotein(a): Secondary | ICD-10-CM | POA: Diagnosis not present

## 2017-02-06 DIAGNOSIS — N2589 Other disorders resulting from impaired renal tubular function: Secondary | ICD-10-CM | POA: Diagnosis not present

## 2017-02-06 DIAGNOSIS — Z4932 Encounter for adequacy testing for peritoneal dialysis: Secondary | ICD-10-CM | POA: Diagnosis not present

## 2017-02-06 DIAGNOSIS — D509 Iron deficiency anemia, unspecified: Secondary | ICD-10-CM | POA: Diagnosis not present

## 2017-02-07 DIAGNOSIS — Z4932 Encounter for adequacy testing for peritoneal dialysis: Secondary | ICD-10-CM | POA: Diagnosis not present

## 2017-02-07 DIAGNOSIS — R82998 Other abnormal findings in urine: Secondary | ICD-10-CM | POA: Diagnosis not present

## 2017-02-07 DIAGNOSIS — D630 Anemia in neoplastic disease: Secondary | ICD-10-CM | POA: Diagnosis not present

## 2017-02-07 DIAGNOSIS — E7841 Elevated Lipoprotein(a): Secondary | ICD-10-CM | POA: Diagnosis not present

## 2017-02-07 DIAGNOSIS — N2581 Secondary hyperparathyroidism of renal origin: Secondary | ICD-10-CM | POA: Diagnosis not present

## 2017-02-07 DIAGNOSIS — D509 Iron deficiency anemia, unspecified: Secondary | ICD-10-CM | POA: Diagnosis not present

## 2017-02-07 DIAGNOSIS — N2589 Other disorders resulting from impaired renal tubular function: Secondary | ICD-10-CM | POA: Diagnosis not present

## 2017-02-07 DIAGNOSIS — N186 End stage renal disease: Secondary | ICD-10-CM | POA: Diagnosis not present

## 2017-02-08 DIAGNOSIS — E7841 Elevated Lipoprotein(a): Secondary | ICD-10-CM | POA: Diagnosis not present

## 2017-02-08 DIAGNOSIS — Z4932 Encounter for adequacy testing for peritoneal dialysis: Secondary | ICD-10-CM | POA: Diagnosis not present

## 2017-02-08 DIAGNOSIS — N2581 Secondary hyperparathyroidism of renal origin: Secondary | ICD-10-CM | POA: Diagnosis not present

## 2017-02-08 DIAGNOSIS — N2589 Other disorders resulting from impaired renal tubular function: Secondary | ICD-10-CM | POA: Diagnosis not present

## 2017-02-08 DIAGNOSIS — D509 Iron deficiency anemia, unspecified: Secondary | ICD-10-CM | POA: Diagnosis not present

## 2017-02-08 DIAGNOSIS — N186 End stage renal disease: Secondary | ICD-10-CM | POA: Diagnosis not present

## 2017-02-08 DIAGNOSIS — R82998 Other abnormal findings in urine: Secondary | ICD-10-CM | POA: Diagnosis not present

## 2017-02-08 DIAGNOSIS — D630 Anemia in neoplastic disease: Secondary | ICD-10-CM | POA: Diagnosis not present

## 2017-02-09 DIAGNOSIS — N186 End stage renal disease: Secondary | ICD-10-CM | POA: Diagnosis not present

## 2017-02-09 DIAGNOSIS — Z4932 Encounter for adequacy testing for peritoneal dialysis: Secondary | ICD-10-CM | POA: Diagnosis not present

## 2017-02-09 DIAGNOSIS — N2581 Secondary hyperparathyroidism of renal origin: Secondary | ICD-10-CM | POA: Diagnosis not present

## 2017-02-09 DIAGNOSIS — D630 Anemia in neoplastic disease: Secondary | ICD-10-CM | POA: Diagnosis not present

## 2017-02-09 DIAGNOSIS — E7841 Elevated Lipoprotein(a): Secondary | ICD-10-CM | POA: Diagnosis not present

## 2017-02-09 DIAGNOSIS — N2589 Other disorders resulting from impaired renal tubular function: Secondary | ICD-10-CM | POA: Diagnosis not present

## 2017-02-09 DIAGNOSIS — D509 Iron deficiency anemia, unspecified: Secondary | ICD-10-CM | POA: Diagnosis not present

## 2017-02-09 DIAGNOSIS — R82998 Other abnormal findings in urine: Secondary | ICD-10-CM | POA: Diagnosis not present

## 2017-02-10 DIAGNOSIS — N2581 Secondary hyperparathyroidism of renal origin: Secondary | ICD-10-CM | POA: Diagnosis not present

## 2017-02-10 DIAGNOSIS — N186 End stage renal disease: Secondary | ICD-10-CM | POA: Diagnosis not present

## 2017-02-10 DIAGNOSIS — E7841 Elevated Lipoprotein(a): Secondary | ICD-10-CM | POA: Diagnosis not present

## 2017-02-10 DIAGNOSIS — N2589 Other disorders resulting from impaired renal tubular function: Secondary | ICD-10-CM | POA: Diagnosis not present

## 2017-02-10 DIAGNOSIS — R82998 Other abnormal findings in urine: Secondary | ICD-10-CM | POA: Diagnosis not present

## 2017-02-10 DIAGNOSIS — D630 Anemia in neoplastic disease: Secondary | ICD-10-CM | POA: Diagnosis not present

## 2017-02-10 DIAGNOSIS — Z4932 Encounter for adequacy testing for peritoneal dialysis: Secondary | ICD-10-CM | POA: Diagnosis not present

## 2017-02-10 DIAGNOSIS — D509 Iron deficiency anemia, unspecified: Secondary | ICD-10-CM | POA: Diagnosis not present

## 2017-02-11 DIAGNOSIS — N186 End stage renal disease: Secondary | ICD-10-CM | POA: Diagnosis not present

## 2017-02-11 DIAGNOSIS — N2589 Other disorders resulting from impaired renal tubular function: Secondary | ICD-10-CM | POA: Diagnosis not present

## 2017-02-11 DIAGNOSIS — Z4932 Encounter for adequacy testing for peritoneal dialysis: Secondary | ICD-10-CM | POA: Diagnosis not present

## 2017-02-11 DIAGNOSIS — E7841 Elevated Lipoprotein(a): Secondary | ICD-10-CM | POA: Diagnosis not present

## 2017-02-11 DIAGNOSIS — D509 Iron deficiency anemia, unspecified: Secondary | ICD-10-CM | POA: Diagnosis not present

## 2017-02-11 DIAGNOSIS — D630 Anemia in neoplastic disease: Secondary | ICD-10-CM | POA: Diagnosis not present

## 2017-02-11 DIAGNOSIS — R82998 Other abnormal findings in urine: Secondary | ICD-10-CM | POA: Diagnosis not present

## 2017-02-11 DIAGNOSIS — N2581 Secondary hyperparathyroidism of renal origin: Secondary | ICD-10-CM | POA: Diagnosis not present

## 2017-02-12 DIAGNOSIS — R82998 Other abnormal findings in urine: Secondary | ICD-10-CM | POA: Diagnosis not present

## 2017-02-12 DIAGNOSIS — N2589 Other disorders resulting from impaired renal tubular function: Secondary | ICD-10-CM | POA: Diagnosis not present

## 2017-02-12 DIAGNOSIS — N2581 Secondary hyperparathyroidism of renal origin: Secondary | ICD-10-CM | POA: Diagnosis not present

## 2017-02-12 DIAGNOSIS — Z4932 Encounter for adequacy testing for peritoneal dialysis: Secondary | ICD-10-CM | POA: Diagnosis not present

## 2017-02-12 DIAGNOSIS — N186 End stage renal disease: Secondary | ICD-10-CM | POA: Diagnosis not present

## 2017-02-12 DIAGNOSIS — D509 Iron deficiency anemia, unspecified: Secondary | ICD-10-CM | POA: Diagnosis not present

## 2017-02-12 DIAGNOSIS — D630 Anemia in neoplastic disease: Secondary | ICD-10-CM | POA: Diagnosis not present

## 2017-02-12 DIAGNOSIS — E7841 Elevated Lipoprotein(a): Secondary | ICD-10-CM | POA: Diagnosis not present

## 2017-02-13 DIAGNOSIS — E7841 Elevated Lipoprotein(a): Secondary | ICD-10-CM | POA: Diagnosis not present

## 2017-02-13 DIAGNOSIS — N186 End stage renal disease: Secondary | ICD-10-CM | POA: Diagnosis not present

## 2017-02-13 DIAGNOSIS — D509 Iron deficiency anemia, unspecified: Secondary | ICD-10-CM | POA: Diagnosis not present

## 2017-02-13 DIAGNOSIS — D630 Anemia in neoplastic disease: Secondary | ICD-10-CM | POA: Diagnosis not present

## 2017-02-13 DIAGNOSIS — R82998 Other abnormal findings in urine: Secondary | ICD-10-CM | POA: Diagnosis not present

## 2017-02-13 DIAGNOSIS — N2589 Other disorders resulting from impaired renal tubular function: Secondary | ICD-10-CM | POA: Diagnosis not present

## 2017-02-13 DIAGNOSIS — N2581 Secondary hyperparathyroidism of renal origin: Secondary | ICD-10-CM | POA: Diagnosis not present

## 2017-02-13 DIAGNOSIS — Z4932 Encounter for adequacy testing for peritoneal dialysis: Secondary | ICD-10-CM | POA: Diagnosis not present

## 2017-02-14 DIAGNOSIS — N186 End stage renal disease: Secondary | ICD-10-CM | POA: Diagnosis not present

## 2017-02-14 DIAGNOSIS — D630 Anemia in neoplastic disease: Secondary | ICD-10-CM | POA: Diagnosis not present

## 2017-02-14 DIAGNOSIS — R82998 Other abnormal findings in urine: Secondary | ICD-10-CM | POA: Diagnosis not present

## 2017-02-14 DIAGNOSIS — N2581 Secondary hyperparathyroidism of renal origin: Secondary | ICD-10-CM | POA: Diagnosis not present

## 2017-02-14 DIAGNOSIS — E7841 Elevated Lipoprotein(a): Secondary | ICD-10-CM | POA: Diagnosis not present

## 2017-02-14 DIAGNOSIS — N2589 Other disorders resulting from impaired renal tubular function: Secondary | ICD-10-CM | POA: Diagnosis not present

## 2017-02-14 DIAGNOSIS — D509 Iron deficiency anemia, unspecified: Secondary | ICD-10-CM | POA: Diagnosis not present

## 2017-02-14 DIAGNOSIS — Z4932 Encounter for adequacy testing for peritoneal dialysis: Secondary | ICD-10-CM | POA: Diagnosis not present

## 2017-02-15 ENCOUNTER — Other Ambulatory Visit: Payer: Self-pay | Admitting: Internal Medicine

## 2017-02-15 DIAGNOSIS — N186 End stage renal disease: Secondary | ICD-10-CM | POA: Diagnosis not present

## 2017-02-15 DIAGNOSIS — E7841 Elevated Lipoprotein(a): Secondary | ICD-10-CM | POA: Diagnosis not present

## 2017-02-15 DIAGNOSIS — R82998 Other abnormal findings in urine: Secondary | ICD-10-CM | POA: Diagnosis not present

## 2017-02-15 DIAGNOSIS — N2581 Secondary hyperparathyroidism of renal origin: Secondary | ICD-10-CM | POA: Diagnosis not present

## 2017-02-15 DIAGNOSIS — Z1231 Encounter for screening mammogram for malignant neoplasm of breast: Secondary | ICD-10-CM

## 2017-02-15 DIAGNOSIS — D630 Anemia in neoplastic disease: Secondary | ICD-10-CM | POA: Diagnosis not present

## 2017-02-15 DIAGNOSIS — N2589 Other disorders resulting from impaired renal tubular function: Secondary | ICD-10-CM | POA: Diagnosis not present

## 2017-02-15 DIAGNOSIS — Z4932 Encounter for adequacy testing for peritoneal dialysis: Secondary | ICD-10-CM | POA: Diagnosis not present

## 2017-02-15 DIAGNOSIS — D509 Iron deficiency anemia, unspecified: Secondary | ICD-10-CM | POA: Diagnosis not present

## 2017-02-16 DIAGNOSIS — N2589 Other disorders resulting from impaired renal tubular function: Secondary | ICD-10-CM | POA: Diagnosis not present

## 2017-02-16 DIAGNOSIS — D630 Anemia in neoplastic disease: Secondary | ICD-10-CM | POA: Diagnosis not present

## 2017-02-16 DIAGNOSIS — Z4932 Encounter for adequacy testing for peritoneal dialysis: Secondary | ICD-10-CM | POA: Diagnosis not present

## 2017-02-16 DIAGNOSIS — N186 End stage renal disease: Secondary | ICD-10-CM | POA: Diagnosis not present

## 2017-02-16 DIAGNOSIS — D509 Iron deficiency anemia, unspecified: Secondary | ICD-10-CM | POA: Diagnosis not present

## 2017-02-16 DIAGNOSIS — E7841 Elevated Lipoprotein(a): Secondary | ICD-10-CM | POA: Diagnosis not present

## 2017-02-16 DIAGNOSIS — N2581 Secondary hyperparathyroidism of renal origin: Secondary | ICD-10-CM | POA: Diagnosis not present

## 2017-02-16 DIAGNOSIS — R82998 Other abnormal findings in urine: Secondary | ICD-10-CM | POA: Diagnosis not present

## 2017-02-17 DIAGNOSIS — Z4932 Encounter for adequacy testing for peritoneal dialysis: Secondary | ICD-10-CM | POA: Diagnosis not present

## 2017-02-17 DIAGNOSIS — D509 Iron deficiency anemia, unspecified: Secondary | ICD-10-CM | POA: Diagnosis not present

## 2017-02-17 DIAGNOSIS — D630 Anemia in neoplastic disease: Secondary | ICD-10-CM | POA: Diagnosis not present

## 2017-02-17 DIAGNOSIS — N2589 Other disorders resulting from impaired renal tubular function: Secondary | ICD-10-CM | POA: Diagnosis not present

## 2017-02-17 DIAGNOSIS — N2581 Secondary hyperparathyroidism of renal origin: Secondary | ICD-10-CM | POA: Diagnosis not present

## 2017-02-17 DIAGNOSIS — E7841 Elevated Lipoprotein(a): Secondary | ICD-10-CM | POA: Diagnosis not present

## 2017-02-17 DIAGNOSIS — R82998 Other abnormal findings in urine: Secondary | ICD-10-CM | POA: Diagnosis not present

## 2017-02-17 DIAGNOSIS — N186 End stage renal disease: Secondary | ICD-10-CM | POA: Diagnosis not present

## 2017-02-18 DIAGNOSIS — E7841 Elevated Lipoprotein(a): Secondary | ICD-10-CM | POA: Diagnosis not present

## 2017-02-18 DIAGNOSIS — N2581 Secondary hyperparathyroidism of renal origin: Secondary | ICD-10-CM | POA: Diagnosis not present

## 2017-02-18 DIAGNOSIS — D509 Iron deficiency anemia, unspecified: Secondary | ICD-10-CM | POA: Diagnosis not present

## 2017-02-18 DIAGNOSIS — N186 End stage renal disease: Secondary | ICD-10-CM | POA: Diagnosis not present

## 2017-02-18 DIAGNOSIS — D630 Anemia in neoplastic disease: Secondary | ICD-10-CM | POA: Diagnosis not present

## 2017-02-18 DIAGNOSIS — R82998 Other abnormal findings in urine: Secondary | ICD-10-CM | POA: Diagnosis not present

## 2017-02-18 DIAGNOSIS — Z4932 Encounter for adequacy testing for peritoneal dialysis: Secondary | ICD-10-CM | POA: Diagnosis not present

## 2017-02-18 DIAGNOSIS — N2589 Other disorders resulting from impaired renal tubular function: Secondary | ICD-10-CM | POA: Diagnosis not present

## 2017-02-19 DIAGNOSIS — D630 Anemia in neoplastic disease: Secondary | ICD-10-CM | POA: Diagnosis not present

## 2017-02-19 DIAGNOSIS — D509 Iron deficiency anemia, unspecified: Secondary | ICD-10-CM | POA: Diagnosis not present

## 2017-02-19 DIAGNOSIS — Z4932 Encounter for adequacy testing for peritoneal dialysis: Secondary | ICD-10-CM | POA: Diagnosis not present

## 2017-02-19 DIAGNOSIS — R82998 Other abnormal findings in urine: Secondary | ICD-10-CM | POA: Diagnosis not present

## 2017-02-19 DIAGNOSIS — N2589 Other disorders resulting from impaired renal tubular function: Secondary | ICD-10-CM | POA: Diagnosis not present

## 2017-02-19 DIAGNOSIS — N2581 Secondary hyperparathyroidism of renal origin: Secondary | ICD-10-CM | POA: Diagnosis not present

## 2017-02-19 DIAGNOSIS — N186 End stage renal disease: Secondary | ICD-10-CM | POA: Diagnosis not present

## 2017-02-19 DIAGNOSIS — E7841 Elevated Lipoprotein(a): Secondary | ICD-10-CM | POA: Diagnosis not present

## 2017-02-20 DIAGNOSIS — D509 Iron deficiency anemia, unspecified: Secondary | ICD-10-CM | POA: Diagnosis not present

## 2017-02-20 DIAGNOSIS — Z4932 Encounter for adequacy testing for peritoneal dialysis: Secondary | ICD-10-CM | POA: Diagnosis not present

## 2017-02-20 DIAGNOSIS — N2581 Secondary hyperparathyroidism of renal origin: Secondary | ICD-10-CM | POA: Diagnosis not present

## 2017-02-20 DIAGNOSIS — E7841 Elevated Lipoprotein(a): Secondary | ICD-10-CM | POA: Diagnosis not present

## 2017-02-20 DIAGNOSIS — N186 End stage renal disease: Secondary | ICD-10-CM | POA: Diagnosis not present

## 2017-02-20 DIAGNOSIS — N2589 Other disorders resulting from impaired renal tubular function: Secondary | ICD-10-CM | POA: Diagnosis not present

## 2017-02-20 DIAGNOSIS — R82998 Other abnormal findings in urine: Secondary | ICD-10-CM | POA: Diagnosis not present

## 2017-02-20 DIAGNOSIS — D630 Anemia in neoplastic disease: Secondary | ICD-10-CM | POA: Diagnosis not present

## 2017-02-21 DIAGNOSIS — N2589 Other disorders resulting from impaired renal tubular function: Secondary | ICD-10-CM | POA: Diagnosis not present

## 2017-02-21 DIAGNOSIS — D509 Iron deficiency anemia, unspecified: Secondary | ICD-10-CM | POA: Diagnosis not present

## 2017-02-21 DIAGNOSIS — D630 Anemia in neoplastic disease: Secondary | ICD-10-CM | POA: Diagnosis not present

## 2017-02-21 DIAGNOSIS — R82998 Other abnormal findings in urine: Secondary | ICD-10-CM | POA: Diagnosis not present

## 2017-02-21 DIAGNOSIS — Z4932 Encounter for adequacy testing for peritoneal dialysis: Secondary | ICD-10-CM | POA: Diagnosis not present

## 2017-02-21 DIAGNOSIS — N2581 Secondary hyperparathyroidism of renal origin: Secondary | ICD-10-CM | POA: Diagnosis not present

## 2017-02-21 DIAGNOSIS — E7841 Elevated Lipoprotein(a): Secondary | ICD-10-CM | POA: Diagnosis not present

## 2017-02-21 DIAGNOSIS — N186 End stage renal disease: Secondary | ICD-10-CM | POA: Diagnosis not present

## 2017-02-22 DIAGNOSIS — D630 Anemia in neoplastic disease: Secondary | ICD-10-CM | POA: Diagnosis not present

## 2017-02-22 DIAGNOSIS — N2581 Secondary hyperparathyroidism of renal origin: Secondary | ICD-10-CM | POA: Diagnosis not present

## 2017-02-22 DIAGNOSIS — D509 Iron deficiency anemia, unspecified: Secondary | ICD-10-CM | POA: Diagnosis not present

## 2017-02-22 DIAGNOSIS — N2589 Other disorders resulting from impaired renal tubular function: Secondary | ICD-10-CM | POA: Diagnosis not present

## 2017-02-22 DIAGNOSIS — Z4932 Encounter for adequacy testing for peritoneal dialysis: Secondary | ICD-10-CM | POA: Diagnosis not present

## 2017-02-22 DIAGNOSIS — E7841 Elevated Lipoprotein(a): Secondary | ICD-10-CM | POA: Diagnosis not present

## 2017-02-22 DIAGNOSIS — N186 End stage renal disease: Secondary | ICD-10-CM | POA: Diagnosis not present

## 2017-02-22 DIAGNOSIS — R82998 Other abnormal findings in urine: Secondary | ICD-10-CM | POA: Diagnosis not present

## 2017-02-23 DIAGNOSIS — N2589 Other disorders resulting from impaired renal tubular function: Secondary | ICD-10-CM | POA: Diagnosis not present

## 2017-02-23 DIAGNOSIS — N2581 Secondary hyperparathyroidism of renal origin: Secondary | ICD-10-CM | POA: Diagnosis not present

## 2017-02-23 DIAGNOSIS — Z4932 Encounter for adequacy testing for peritoneal dialysis: Secondary | ICD-10-CM | POA: Diagnosis not present

## 2017-02-23 DIAGNOSIS — D630 Anemia in neoplastic disease: Secondary | ICD-10-CM | POA: Diagnosis not present

## 2017-02-23 DIAGNOSIS — N186 End stage renal disease: Secondary | ICD-10-CM | POA: Diagnosis not present

## 2017-02-23 DIAGNOSIS — E7841 Elevated Lipoprotein(a): Secondary | ICD-10-CM | POA: Diagnosis not present

## 2017-02-23 DIAGNOSIS — D509 Iron deficiency anemia, unspecified: Secondary | ICD-10-CM | POA: Diagnosis not present

## 2017-02-23 DIAGNOSIS — R82998 Other abnormal findings in urine: Secondary | ICD-10-CM | POA: Diagnosis not present

## 2017-02-24 DIAGNOSIS — N2581 Secondary hyperparathyroidism of renal origin: Secondary | ICD-10-CM | POA: Diagnosis not present

## 2017-02-24 DIAGNOSIS — D630 Anemia in neoplastic disease: Secondary | ICD-10-CM | POA: Diagnosis not present

## 2017-02-24 DIAGNOSIS — R82998 Other abnormal findings in urine: Secondary | ICD-10-CM | POA: Diagnosis not present

## 2017-02-24 DIAGNOSIS — N186 End stage renal disease: Secondary | ICD-10-CM | POA: Diagnosis not present

## 2017-02-24 DIAGNOSIS — E7841 Elevated Lipoprotein(a): Secondary | ICD-10-CM | POA: Diagnosis not present

## 2017-02-24 DIAGNOSIS — N2589 Other disorders resulting from impaired renal tubular function: Secondary | ICD-10-CM | POA: Diagnosis not present

## 2017-02-24 DIAGNOSIS — Z4932 Encounter for adequacy testing for peritoneal dialysis: Secondary | ICD-10-CM | POA: Diagnosis not present

## 2017-02-24 DIAGNOSIS — D509 Iron deficiency anemia, unspecified: Secondary | ICD-10-CM | POA: Diagnosis not present

## 2017-02-25 DIAGNOSIS — N186 End stage renal disease: Secondary | ICD-10-CM | POA: Diagnosis not present

## 2017-02-25 DIAGNOSIS — N2581 Secondary hyperparathyroidism of renal origin: Secondary | ICD-10-CM | POA: Diagnosis not present

## 2017-02-25 DIAGNOSIS — R82998 Other abnormal findings in urine: Secondary | ICD-10-CM | POA: Diagnosis not present

## 2017-02-25 DIAGNOSIS — D509 Iron deficiency anemia, unspecified: Secondary | ICD-10-CM | POA: Diagnosis not present

## 2017-02-25 DIAGNOSIS — N2589 Other disorders resulting from impaired renal tubular function: Secondary | ICD-10-CM | POA: Diagnosis not present

## 2017-02-25 DIAGNOSIS — D630 Anemia in neoplastic disease: Secondary | ICD-10-CM | POA: Diagnosis not present

## 2017-02-25 DIAGNOSIS — Z4932 Encounter for adequacy testing for peritoneal dialysis: Secondary | ICD-10-CM | POA: Diagnosis not present

## 2017-02-25 DIAGNOSIS — E7841 Elevated Lipoprotein(a): Secondary | ICD-10-CM | POA: Diagnosis not present

## 2017-02-26 DIAGNOSIS — N2589 Other disorders resulting from impaired renal tubular function: Secondary | ICD-10-CM | POA: Diagnosis not present

## 2017-02-26 DIAGNOSIS — N2581 Secondary hyperparathyroidism of renal origin: Secondary | ICD-10-CM | POA: Diagnosis not present

## 2017-02-26 DIAGNOSIS — D509 Iron deficiency anemia, unspecified: Secondary | ICD-10-CM | POA: Diagnosis not present

## 2017-02-26 DIAGNOSIS — Z4932 Encounter for adequacy testing for peritoneal dialysis: Secondary | ICD-10-CM | POA: Diagnosis not present

## 2017-02-26 DIAGNOSIS — N186 End stage renal disease: Secondary | ICD-10-CM | POA: Diagnosis not present

## 2017-02-26 DIAGNOSIS — D630 Anemia in neoplastic disease: Secondary | ICD-10-CM | POA: Diagnosis not present

## 2017-02-26 DIAGNOSIS — E7841 Elevated Lipoprotein(a): Secondary | ICD-10-CM | POA: Diagnosis not present

## 2017-02-26 DIAGNOSIS — R82998 Other abnormal findings in urine: Secondary | ICD-10-CM | POA: Diagnosis not present

## 2017-02-27 DIAGNOSIS — N186 End stage renal disease: Secondary | ICD-10-CM | POA: Diagnosis not present

## 2017-02-27 DIAGNOSIS — D630 Anemia in neoplastic disease: Secondary | ICD-10-CM | POA: Diagnosis not present

## 2017-02-27 DIAGNOSIS — Z4932 Encounter for adequacy testing for peritoneal dialysis: Secondary | ICD-10-CM | POA: Diagnosis not present

## 2017-02-27 DIAGNOSIS — R82998 Other abnormal findings in urine: Secondary | ICD-10-CM | POA: Diagnosis not present

## 2017-02-27 DIAGNOSIS — E7841 Elevated Lipoprotein(a): Secondary | ICD-10-CM | POA: Diagnosis not present

## 2017-02-27 DIAGNOSIS — N2581 Secondary hyperparathyroidism of renal origin: Secondary | ICD-10-CM | POA: Diagnosis not present

## 2017-02-27 DIAGNOSIS — N2589 Other disorders resulting from impaired renal tubular function: Secondary | ICD-10-CM | POA: Diagnosis not present

## 2017-02-27 DIAGNOSIS — D509 Iron deficiency anemia, unspecified: Secondary | ICD-10-CM | POA: Diagnosis not present

## 2017-02-28 DIAGNOSIS — D509 Iron deficiency anemia, unspecified: Secondary | ICD-10-CM | POA: Diagnosis not present

## 2017-02-28 DIAGNOSIS — N186 End stage renal disease: Secondary | ICD-10-CM | POA: Diagnosis not present

## 2017-02-28 DIAGNOSIS — Z4932 Encounter for adequacy testing for peritoneal dialysis: Secondary | ICD-10-CM | POA: Diagnosis not present

## 2017-02-28 DIAGNOSIS — E7841 Elevated Lipoprotein(a): Secondary | ICD-10-CM | POA: Diagnosis not present

## 2017-02-28 DIAGNOSIS — D630 Anemia in neoplastic disease: Secondary | ICD-10-CM | POA: Diagnosis not present

## 2017-02-28 DIAGNOSIS — N2581 Secondary hyperparathyroidism of renal origin: Secondary | ICD-10-CM | POA: Diagnosis not present

## 2017-02-28 DIAGNOSIS — N2589 Other disorders resulting from impaired renal tubular function: Secondary | ICD-10-CM | POA: Diagnosis not present

## 2017-02-28 DIAGNOSIS — R82998 Other abnormal findings in urine: Secondary | ICD-10-CM | POA: Diagnosis not present

## 2017-03-01 DIAGNOSIS — E7841 Elevated Lipoprotein(a): Secondary | ICD-10-CM | POA: Diagnosis not present

## 2017-03-01 DIAGNOSIS — R82998 Other abnormal findings in urine: Secondary | ICD-10-CM | POA: Diagnosis not present

## 2017-03-01 DIAGNOSIS — N2581 Secondary hyperparathyroidism of renal origin: Secondary | ICD-10-CM | POA: Diagnosis not present

## 2017-03-01 DIAGNOSIS — Z4932 Encounter for adequacy testing for peritoneal dialysis: Secondary | ICD-10-CM | POA: Diagnosis not present

## 2017-03-01 DIAGNOSIS — D509 Iron deficiency anemia, unspecified: Secondary | ICD-10-CM | POA: Diagnosis not present

## 2017-03-01 DIAGNOSIS — N186 End stage renal disease: Secondary | ICD-10-CM | POA: Diagnosis not present

## 2017-03-01 DIAGNOSIS — D630 Anemia in neoplastic disease: Secondary | ICD-10-CM | POA: Diagnosis not present

## 2017-03-01 DIAGNOSIS — N2589 Other disorders resulting from impaired renal tubular function: Secondary | ICD-10-CM | POA: Diagnosis not present

## 2017-03-02 ENCOUNTER — Ambulatory Visit
Admission: RE | Admit: 2017-03-02 | Discharge: 2017-03-02 | Disposition: A | Discharge status: Home / Self Care | Payer: Medicare HMO | Source: Ambulatory Visit | Attending: Internal Medicine | Admitting: Internal Medicine

## 2017-03-02 DIAGNOSIS — Z4932 Encounter for adequacy testing for peritoneal dialysis: Secondary | ICD-10-CM | POA: Diagnosis not present

## 2017-03-02 DIAGNOSIS — Z1231 Encounter for screening mammogram for malignant neoplasm of breast: Secondary | ICD-10-CM | POA: Diagnosis not present

## 2017-03-02 DIAGNOSIS — D509 Iron deficiency anemia, unspecified: Secondary | ICD-10-CM | POA: Diagnosis not present

## 2017-03-02 DIAGNOSIS — D630 Anemia in neoplastic disease: Secondary | ICD-10-CM | POA: Diagnosis not present

## 2017-03-02 DIAGNOSIS — N186 End stage renal disease: Secondary | ICD-10-CM | POA: Diagnosis not present

## 2017-03-02 DIAGNOSIS — N2589 Other disorders resulting from impaired renal tubular function: Secondary | ICD-10-CM | POA: Diagnosis not present

## 2017-03-02 DIAGNOSIS — N2581 Secondary hyperparathyroidism of renal origin: Secondary | ICD-10-CM | POA: Diagnosis not present

## 2017-03-02 DIAGNOSIS — E7841 Elevated Lipoprotein(a): Secondary | ICD-10-CM | POA: Diagnosis not present

## 2017-03-02 DIAGNOSIS — R82998 Other abnormal findings in urine: Secondary | ICD-10-CM | POA: Diagnosis not present

## 2017-03-03 ENCOUNTER — Other Ambulatory Visit: Payer: Self-pay | Admitting: Internal Medicine

## 2017-03-03 DIAGNOSIS — N2589 Other disorders resulting from impaired renal tubular function: Secondary | ICD-10-CM | POA: Diagnosis not present

## 2017-03-03 DIAGNOSIS — D509 Iron deficiency anemia, unspecified: Secondary | ICD-10-CM | POA: Diagnosis not present

## 2017-03-03 DIAGNOSIS — D630 Anemia in neoplastic disease: Secondary | ICD-10-CM | POA: Diagnosis not present

## 2017-03-03 DIAGNOSIS — N186 End stage renal disease: Secondary | ICD-10-CM | POA: Diagnosis not present

## 2017-03-03 DIAGNOSIS — N2581 Secondary hyperparathyroidism of renal origin: Secondary | ICD-10-CM | POA: Diagnosis not present

## 2017-03-03 DIAGNOSIS — E7841 Elevated Lipoprotein(a): Secondary | ICD-10-CM | POA: Diagnosis not present

## 2017-03-03 DIAGNOSIS — Z4932 Encounter for adequacy testing for peritoneal dialysis: Secondary | ICD-10-CM | POA: Diagnosis not present

## 2017-03-03 DIAGNOSIS — R928 Other abnormal and inconclusive findings on diagnostic imaging of breast: Secondary | ICD-10-CM

## 2017-03-03 DIAGNOSIS — R82998 Other abnormal findings in urine: Secondary | ICD-10-CM | POA: Diagnosis not present

## 2017-03-04 DIAGNOSIS — N2589 Other disorders resulting from impaired renal tubular function: Secondary | ICD-10-CM | POA: Diagnosis not present

## 2017-03-04 DIAGNOSIS — N2581 Secondary hyperparathyroidism of renal origin: Secondary | ICD-10-CM | POA: Diagnosis not present

## 2017-03-04 DIAGNOSIS — D509 Iron deficiency anemia, unspecified: Secondary | ICD-10-CM | POA: Diagnosis not present

## 2017-03-04 DIAGNOSIS — N186 End stage renal disease: Secondary | ICD-10-CM | POA: Diagnosis not present

## 2017-03-04 DIAGNOSIS — D630 Anemia in neoplastic disease: Secondary | ICD-10-CM | POA: Diagnosis not present

## 2017-03-04 DIAGNOSIS — Z4932 Encounter for adequacy testing for peritoneal dialysis: Secondary | ICD-10-CM | POA: Diagnosis not present

## 2017-03-04 DIAGNOSIS — R82998 Other abnormal findings in urine: Secondary | ICD-10-CM | POA: Diagnosis not present

## 2017-03-04 DIAGNOSIS — E7841 Elevated Lipoprotein(a): Secondary | ICD-10-CM | POA: Diagnosis not present

## 2017-03-05 DIAGNOSIS — Z4932 Encounter for adequacy testing for peritoneal dialysis: Secondary | ICD-10-CM | POA: Diagnosis not present

## 2017-03-05 DIAGNOSIS — E7841 Elevated Lipoprotein(a): Secondary | ICD-10-CM | POA: Diagnosis not present

## 2017-03-05 DIAGNOSIS — N2589 Other disorders resulting from impaired renal tubular function: Secondary | ICD-10-CM | POA: Diagnosis not present

## 2017-03-05 DIAGNOSIS — D509 Iron deficiency anemia, unspecified: Secondary | ICD-10-CM | POA: Diagnosis not present

## 2017-03-05 DIAGNOSIS — N186 End stage renal disease: Secondary | ICD-10-CM | POA: Diagnosis not present

## 2017-03-05 DIAGNOSIS — N2581 Secondary hyperparathyroidism of renal origin: Secondary | ICD-10-CM | POA: Diagnosis not present

## 2017-03-05 DIAGNOSIS — D630 Anemia in neoplastic disease: Secondary | ICD-10-CM | POA: Diagnosis not present

## 2017-03-05 DIAGNOSIS — R82998 Other abnormal findings in urine: Secondary | ICD-10-CM | POA: Diagnosis not present

## 2017-03-06 DIAGNOSIS — R82998 Other abnormal findings in urine: Secondary | ICD-10-CM | POA: Diagnosis not present

## 2017-03-06 DIAGNOSIS — N2581 Secondary hyperparathyroidism of renal origin: Secondary | ICD-10-CM | POA: Diagnosis not present

## 2017-03-06 DIAGNOSIS — N186 End stage renal disease: Secondary | ICD-10-CM | POA: Diagnosis not present

## 2017-03-06 DIAGNOSIS — D509 Iron deficiency anemia, unspecified: Secondary | ICD-10-CM | POA: Diagnosis not present

## 2017-03-06 DIAGNOSIS — N2589 Other disorders resulting from impaired renal tubular function: Secondary | ICD-10-CM | POA: Diagnosis not present

## 2017-03-06 DIAGNOSIS — E7841 Elevated Lipoprotein(a): Secondary | ICD-10-CM | POA: Diagnosis not present

## 2017-03-06 DIAGNOSIS — Z4932 Encounter for adequacy testing for peritoneal dialysis: Secondary | ICD-10-CM | POA: Diagnosis not present

## 2017-03-06 DIAGNOSIS — D630 Anemia in neoplastic disease: Secondary | ICD-10-CM | POA: Diagnosis not present

## 2017-03-07 DIAGNOSIS — N186 End stage renal disease: Secondary | ICD-10-CM | POA: Diagnosis not present

## 2017-03-07 DIAGNOSIS — N2589 Other disorders resulting from impaired renal tubular function: Secondary | ICD-10-CM | POA: Diagnosis not present

## 2017-03-07 DIAGNOSIS — E7841 Elevated Lipoprotein(a): Secondary | ICD-10-CM | POA: Diagnosis not present

## 2017-03-07 DIAGNOSIS — N2581 Secondary hyperparathyroidism of renal origin: Secondary | ICD-10-CM | POA: Diagnosis not present

## 2017-03-07 DIAGNOSIS — Z4932 Encounter for adequacy testing for peritoneal dialysis: Secondary | ICD-10-CM | POA: Diagnosis not present

## 2017-03-07 DIAGNOSIS — D509 Iron deficiency anemia, unspecified: Secondary | ICD-10-CM | POA: Diagnosis not present

## 2017-03-07 DIAGNOSIS — R82998 Other abnormal findings in urine: Secondary | ICD-10-CM | POA: Diagnosis not present

## 2017-03-07 DIAGNOSIS — D630 Anemia in neoplastic disease: Secondary | ICD-10-CM | POA: Diagnosis not present

## 2017-03-08 DIAGNOSIS — Z4932 Encounter for adequacy testing for peritoneal dialysis: Secondary | ICD-10-CM | POA: Diagnosis not present

## 2017-03-08 DIAGNOSIS — I150 Renovascular hypertension: Secondary | ICD-10-CM | POA: Diagnosis not present

## 2017-03-08 DIAGNOSIS — N186 End stage renal disease: Secondary | ICD-10-CM | POA: Diagnosis not present

## 2017-03-08 DIAGNOSIS — E7841 Elevated Lipoprotein(a): Secondary | ICD-10-CM | POA: Diagnosis not present

## 2017-03-08 DIAGNOSIS — D630 Anemia in neoplastic disease: Secondary | ICD-10-CM | POA: Diagnosis not present

## 2017-03-08 DIAGNOSIS — N2581 Secondary hyperparathyroidism of renal origin: Secondary | ICD-10-CM | POA: Diagnosis not present

## 2017-03-08 DIAGNOSIS — Z992 Dependence on renal dialysis: Secondary | ICD-10-CM | POA: Diagnosis not present

## 2017-03-08 DIAGNOSIS — D509 Iron deficiency anemia, unspecified: Secondary | ICD-10-CM | POA: Diagnosis not present

## 2017-03-08 DIAGNOSIS — N2589 Other disorders resulting from impaired renal tubular function: Secondary | ICD-10-CM | POA: Diagnosis not present

## 2017-03-08 DIAGNOSIS — R82998 Other abnormal findings in urine: Secondary | ICD-10-CM | POA: Diagnosis not present

## 2017-03-09 ENCOUNTER — Ambulatory Visit
Admission: RE | Admit: 2017-03-09 | Discharge: 2017-03-09 | Disposition: A | Discharge status: Home / Self Care | Payer: Medicare HMO | Source: Ambulatory Visit | Attending: Internal Medicine | Admitting: Internal Medicine

## 2017-03-09 DIAGNOSIS — R928 Other abnormal and inconclusive findings on diagnostic imaging of breast: Secondary | ICD-10-CM

## 2017-03-09 DIAGNOSIS — R922 Inconclusive mammogram: Secondary | ICD-10-CM | POA: Diagnosis not present

## 2017-03-09 DIAGNOSIS — N6489 Other specified disorders of breast: Secondary | ICD-10-CM | POA: Diagnosis not present

## 2017-03-09 DIAGNOSIS — E440 Moderate protein-calorie malnutrition: Secondary | ICD-10-CM | POA: Diagnosis not present

## 2017-03-09 DIAGNOSIS — N2581 Secondary hyperparathyroidism of renal origin: Secondary | ICD-10-CM | POA: Diagnosis not present

## 2017-03-09 DIAGNOSIS — Z79899 Other long term (current) drug therapy: Secondary | ICD-10-CM | POA: Diagnosis not present

## 2017-03-09 DIAGNOSIS — D509 Iron deficiency anemia, unspecified: Secondary | ICD-10-CM | POA: Diagnosis not present

## 2017-03-09 DIAGNOSIS — R82998 Other abnormal findings in urine: Secondary | ICD-10-CM | POA: Diagnosis not present

## 2017-03-09 DIAGNOSIS — N186 End stage renal disease: Secondary | ICD-10-CM | POA: Diagnosis not present

## 2017-03-09 DIAGNOSIS — D630 Anemia in neoplastic disease: Secondary | ICD-10-CM | POA: Diagnosis not present

## 2017-03-09 DIAGNOSIS — E876 Hypokalemia: Secondary | ICD-10-CM | POA: Diagnosis not present

## 2017-03-10 DIAGNOSIS — E876 Hypokalemia: Secondary | ICD-10-CM | POA: Diagnosis not present

## 2017-03-10 DIAGNOSIS — R82998 Other abnormal findings in urine: Secondary | ICD-10-CM | POA: Diagnosis not present

## 2017-03-10 DIAGNOSIS — Z79899 Other long term (current) drug therapy: Secondary | ICD-10-CM | POA: Diagnosis not present

## 2017-03-10 DIAGNOSIS — N186 End stage renal disease: Secondary | ICD-10-CM | POA: Diagnosis not present

## 2017-03-10 DIAGNOSIS — E440 Moderate protein-calorie malnutrition: Secondary | ICD-10-CM | POA: Diagnosis not present

## 2017-03-10 DIAGNOSIS — D509 Iron deficiency anemia, unspecified: Secondary | ICD-10-CM | POA: Diagnosis not present

## 2017-03-10 DIAGNOSIS — D630 Anemia in neoplastic disease: Secondary | ICD-10-CM | POA: Diagnosis not present

## 2017-03-10 DIAGNOSIS — N2581 Secondary hyperparathyroidism of renal origin: Secondary | ICD-10-CM | POA: Diagnosis not present

## 2017-03-11 DIAGNOSIS — Z79899 Other long term (current) drug therapy: Secondary | ICD-10-CM | POA: Diagnosis not present

## 2017-03-11 DIAGNOSIS — N2581 Secondary hyperparathyroidism of renal origin: Secondary | ICD-10-CM | POA: Diagnosis not present

## 2017-03-11 DIAGNOSIS — E440 Moderate protein-calorie malnutrition: Secondary | ICD-10-CM | POA: Diagnosis not present

## 2017-03-11 DIAGNOSIS — R82998 Other abnormal findings in urine: Secondary | ICD-10-CM | POA: Diagnosis not present

## 2017-03-11 DIAGNOSIS — E876 Hypokalemia: Secondary | ICD-10-CM | POA: Diagnosis not present

## 2017-03-11 DIAGNOSIS — D509 Iron deficiency anemia, unspecified: Secondary | ICD-10-CM | POA: Diagnosis not present

## 2017-03-11 DIAGNOSIS — D630 Anemia in neoplastic disease: Secondary | ICD-10-CM | POA: Diagnosis not present

## 2017-03-11 DIAGNOSIS — N186 End stage renal disease: Secondary | ICD-10-CM | POA: Diagnosis not present

## 2017-03-12 DIAGNOSIS — R82998 Other abnormal findings in urine: Secondary | ICD-10-CM | POA: Diagnosis not present

## 2017-03-12 DIAGNOSIS — N2581 Secondary hyperparathyroidism of renal origin: Secondary | ICD-10-CM | POA: Diagnosis not present

## 2017-03-12 DIAGNOSIS — D630 Anemia in neoplastic disease: Secondary | ICD-10-CM | POA: Diagnosis not present

## 2017-03-12 DIAGNOSIS — Z79899 Other long term (current) drug therapy: Secondary | ICD-10-CM | POA: Diagnosis not present

## 2017-03-12 DIAGNOSIS — E440 Moderate protein-calorie malnutrition: Secondary | ICD-10-CM | POA: Diagnosis not present

## 2017-03-12 DIAGNOSIS — N186 End stage renal disease: Secondary | ICD-10-CM | POA: Diagnosis not present

## 2017-03-12 DIAGNOSIS — D509 Iron deficiency anemia, unspecified: Secondary | ICD-10-CM | POA: Diagnosis not present

## 2017-03-12 DIAGNOSIS — E876 Hypokalemia: Secondary | ICD-10-CM | POA: Diagnosis not present

## 2017-03-13 DIAGNOSIS — R82998 Other abnormal findings in urine: Secondary | ICD-10-CM | POA: Diagnosis not present

## 2017-03-13 DIAGNOSIS — N2581 Secondary hyperparathyroidism of renal origin: Secondary | ICD-10-CM | POA: Diagnosis not present

## 2017-03-13 DIAGNOSIS — N186 End stage renal disease: Secondary | ICD-10-CM | POA: Diagnosis not present

## 2017-03-13 DIAGNOSIS — D509 Iron deficiency anemia, unspecified: Secondary | ICD-10-CM | POA: Diagnosis not present

## 2017-03-13 DIAGNOSIS — E440 Moderate protein-calorie malnutrition: Secondary | ICD-10-CM | POA: Diagnosis not present

## 2017-03-13 DIAGNOSIS — Z79899 Other long term (current) drug therapy: Secondary | ICD-10-CM | POA: Diagnosis not present

## 2017-03-13 DIAGNOSIS — D630 Anemia in neoplastic disease: Secondary | ICD-10-CM | POA: Diagnosis not present

## 2017-03-13 DIAGNOSIS — E876 Hypokalemia: Secondary | ICD-10-CM | POA: Diagnosis not present

## 2017-03-14 DIAGNOSIS — N2581 Secondary hyperparathyroidism of renal origin: Secondary | ICD-10-CM | POA: Diagnosis not present

## 2017-03-14 DIAGNOSIS — E440 Moderate protein-calorie malnutrition: Secondary | ICD-10-CM | POA: Diagnosis not present

## 2017-03-14 DIAGNOSIS — D630 Anemia in neoplastic disease: Secondary | ICD-10-CM | POA: Diagnosis not present

## 2017-03-14 DIAGNOSIS — E876 Hypokalemia: Secondary | ICD-10-CM | POA: Diagnosis not present

## 2017-03-14 DIAGNOSIS — Z79899 Other long term (current) drug therapy: Secondary | ICD-10-CM | POA: Diagnosis not present

## 2017-03-14 DIAGNOSIS — N186 End stage renal disease: Secondary | ICD-10-CM | POA: Diagnosis not present

## 2017-03-14 DIAGNOSIS — D509 Iron deficiency anemia, unspecified: Secondary | ICD-10-CM | POA: Diagnosis not present

## 2017-03-14 DIAGNOSIS — R82998 Other abnormal findings in urine: Secondary | ICD-10-CM | POA: Diagnosis not present

## 2017-03-15 DIAGNOSIS — D630 Anemia in neoplastic disease: Secondary | ICD-10-CM | POA: Diagnosis not present

## 2017-03-15 DIAGNOSIS — R82998 Other abnormal findings in urine: Secondary | ICD-10-CM | POA: Diagnosis not present

## 2017-03-15 DIAGNOSIS — Z79899 Other long term (current) drug therapy: Secondary | ICD-10-CM | POA: Diagnosis not present

## 2017-03-15 DIAGNOSIS — N186 End stage renal disease: Secondary | ICD-10-CM | POA: Diagnosis not present

## 2017-03-15 DIAGNOSIS — N2581 Secondary hyperparathyroidism of renal origin: Secondary | ICD-10-CM | POA: Diagnosis not present

## 2017-03-15 DIAGNOSIS — E440 Moderate protein-calorie malnutrition: Secondary | ICD-10-CM | POA: Diagnosis not present

## 2017-03-15 DIAGNOSIS — D509 Iron deficiency anemia, unspecified: Secondary | ICD-10-CM | POA: Diagnosis not present

## 2017-03-15 DIAGNOSIS — E876 Hypokalemia: Secondary | ICD-10-CM | POA: Diagnosis not present

## 2017-03-16 DIAGNOSIS — E876 Hypokalemia: Secondary | ICD-10-CM | POA: Diagnosis not present

## 2017-03-16 DIAGNOSIS — N186 End stage renal disease: Secondary | ICD-10-CM | POA: Diagnosis not present

## 2017-03-16 DIAGNOSIS — Z79899 Other long term (current) drug therapy: Secondary | ICD-10-CM | POA: Diagnosis not present

## 2017-03-16 DIAGNOSIS — N2581 Secondary hyperparathyroidism of renal origin: Secondary | ICD-10-CM | POA: Diagnosis not present

## 2017-03-16 DIAGNOSIS — D509 Iron deficiency anemia, unspecified: Secondary | ICD-10-CM | POA: Diagnosis not present

## 2017-03-16 DIAGNOSIS — D630 Anemia in neoplastic disease: Secondary | ICD-10-CM | POA: Diagnosis not present

## 2017-03-16 DIAGNOSIS — E440 Moderate protein-calorie malnutrition: Secondary | ICD-10-CM | POA: Diagnosis not present

## 2017-03-16 DIAGNOSIS — R82998 Other abnormal findings in urine: Secondary | ICD-10-CM | POA: Diagnosis not present

## 2017-03-17 DIAGNOSIS — R82998 Other abnormal findings in urine: Secondary | ICD-10-CM | POA: Diagnosis not present

## 2017-03-17 DIAGNOSIS — N186 End stage renal disease: Secondary | ICD-10-CM | POA: Diagnosis not present

## 2017-03-17 DIAGNOSIS — E440 Moderate protein-calorie malnutrition: Secondary | ICD-10-CM | POA: Diagnosis not present

## 2017-03-17 DIAGNOSIS — D630 Anemia in neoplastic disease: Secondary | ICD-10-CM | POA: Diagnosis not present

## 2017-03-17 DIAGNOSIS — E876 Hypokalemia: Secondary | ICD-10-CM | POA: Diagnosis not present

## 2017-03-17 DIAGNOSIS — D509 Iron deficiency anemia, unspecified: Secondary | ICD-10-CM | POA: Diagnosis not present

## 2017-03-17 DIAGNOSIS — Z79899 Other long term (current) drug therapy: Secondary | ICD-10-CM | POA: Diagnosis not present

## 2017-03-17 DIAGNOSIS — N2581 Secondary hyperparathyroidism of renal origin: Secondary | ICD-10-CM | POA: Diagnosis not present

## 2017-03-18 DIAGNOSIS — D509 Iron deficiency anemia, unspecified: Secondary | ICD-10-CM | POA: Diagnosis not present

## 2017-03-18 DIAGNOSIS — E876 Hypokalemia: Secondary | ICD-10-CM | POA: Diagnosis not present

## 2017-03-18 DIAGNOSIS — D630 Anemia in neoplastic disease: Secondary | ICD-10-CM | POA: Diagnosis not present

## 2017-03-18 DIAGNOSIS — R82998 Other abnormal findings in urine: Secondary | ICD-10-CM | POA: Diagnosis not present

## 2017-03-18 DIAGNOSIS — E440 Moderate protein-calorie malnutrition: Secondary | ICD-10-CM | POA: Diagnosis not present

## 2017-03-18 DIAGNOSIS — N186 End stage renal disease: Secondary | ICD-10-CM | POA: Diagnosis not present

## 2017-03-18 DIAGNOSIS — Z79899 Other long term (current) drug therapy: Secondary | ICD-10-CM | POA: Diagnosis not present

## 2017-03-18 DIAGNOSIS — N2581 Secondary hyperparathyroidism of renal origin: Secondary | ICD-10-CM | POA: Diagnosis not present

## 2017-03-19 DIAGNOSIS — Z79899 Other long term (current) drug therapy: Secondary | ICD-10-CM | POA: Diagnosis not present

## 2017-03-19 DIAGNOSIS — E440 Moderate protein-calorie malnutrition: Secondary | ICD-10-CM | POA: Diagnosis not present

## 2017-03-19 DIAGNOSIS — N186 End stage renal disease: Secondary | ICD-10-CM | POA: Diagnosis not present

## 2017-03-19 DIAGNOSIS — R82998 Other abnormal findings in urine: Secondary | ICD-10-CM | POA: Diagnosis not present

## 2017-03-19 DIAGNOSIS — N2581 Secondary hyperparathyroidism of renal origin: Secondary | ICD-10-CM | POA: Diagnosis not present

## 2017-03-19 DIAGNOSIS — D509 Iron deficiency anemia, unspecified: Secondary | ICD-10-CM | POA: Diagnosis not present

## 2017-03-19 DIAGNOSIS — E876 Hypokalemia: Secondary | ICD-10-CM | POA: Diagnosis not present

## 2017-03-19 DIAGNOSIS — D630 Anemia in neoplastic disease: Secondary | ICD-10-CM | POA: Diagnosis not present

## 2017-03-20 DIAGNOSIS — D509 Iron deficiency anemia, unspecified: Secondary | ICD-10-CM | POA: Diagnosis not present

## 2017-03-20 DIAGNOSIS — E876 Hypokalemia: Secondary | ICD-10-CM | POA: Diagnosis not present

## 2017-03-20 DIAGNOSIS — N2581 Secondary hyperparathyroidism of renal origin: Secondary | ICD-10-CM | POA: Diagnosis not present

## 2017-03-20 DIAGNOSIS — N186 End stage renal disease: Secondary | ICD-10-CM | POA: Diagnosis not present

## 2017-03-20 DIAGNOSIS — R82998 Other abnormal findings in urine: Secondary | ICD-10-CM | POA: Diagnosis not present

## 2017-03-20 DIAGNOSIS — D630 Anemia in neoplastic disease: Secondary | ICD-10-CM | POA: Diagnosis not present

## 2017-03-20 DIAGNOSIS — E440 Moderate protein-calorie malnutrition: Secondary | ICD-10-CM | POA: Diagnosis not present

## 2017-03-20 DIAGNOSIS — Z79899 Other long term (current) drug therapy: Secondary | ICD-10-CM | POA: Diagnosis not present

## 2017-03-21 DIAGNOSIS — R82998 Other abnormal findings in urine: Secondary | ICD-10-CM | POA: Diagnosis not present

## 2017-03-21 DIAGNOSIS — Z79899 Other long term (current) drug therapy: Secondary | ICD-10-CM | POA: Diagnosis not present

## 2017-03-21 DIAGNOSIS — D630 Anemia in neoplastic disease: Secondary | ICD-10-CM | POA: Diagnosis not present

## 2017-03-21 DIAGNOSIS — N2581 Secondary hyperparathyroidism of renal origin: Secondary | ICD-10-CM | POA: Diagnosis not present

## 2017-03-21 DIAGNOSIS — D509 Iron deficiency anemia, unspecified: Secondary | ICD-10-CM | POA: Diagnosis not present

## 2017-03-21 DIAGNOSIS — N186 End stage renal disease: Secondary | ICD-10-CM | POA: Diagnosis not present

## 2017-03-21 DIAGNOSIS — E440 Moderate protein-calorie malnutrition: Secondary | ICD-10-CM | POA: Diagnosis not present

## 2017-03-21 DIAGNOSIS — E876 Hypokalemia: Secondary | ICD-10-CM | POA: Diagnosis not present

## 2017-03-22 DIAGNOSIS — N186 End stage renal disease: Secondary | ICD-10-CM | POA: Diagnosis not present

## 2017-03-22 DIAGNOSIS — E440 Moderate protein-calorie malnutrition: Secondary | ICD-10-CM | POA: Diagnosis not present

## 2017-03-22 DIAGNOSIS — D509 Iron deficiency anemia, unspecified: Secondary | ICD-10-CM | POA: Diagnosis not present

## 2017-03-22 DIAGNOSIS — R82998 Other abnormal findings in urine: Secondary | ICD-10-CM | POA: Diagnosis not present

## 2017-03-22 DIAGNOSIS — Z79899 Other long term (current) drug therapy: Secondary | ICD-10-CM | POA: Diagnosis not present

## 2017-03-22 DIAGNOSIS — E876 Hypokalemia: Secondary | ICD-10-CM | POA: Diagnosis not present

## 2017-03-22 DIAGNOSIS — N2581 Secondary hyperparathyroidism of renal origin: Secondary | ICD-10-CM | POA: Diagnosis not present

## 2017-03-22 DIAGNOSIS — D630 Anemia in neoplastic disease: Secondary | ICD-10-CM | POA: Diagnosis not present

## 2017-03-23 DIAGNOSIS — N2581 Secondary hyperparathyroidism of renal origin: Secondary | ICD-10-CM | POA: Diagnosis not present

## 2017-03-23 DIAGNOSIS — R82998 Other abnormal findings in urine: Secondary | ICD-10-CM | POA: Diagnosis not present

## 2017-03-23 DIAGNOSIS — D509 Iron deficiency anemia, unspecified: Secondary | ICD-10-CM | POA: Diagnosis not present

## 2017-03-23 DIAGNOSIS — Z79899 Other long term (current) drug therapy: Secondary | ICD-10-CM | POA: Diagnosis not present

## 2017-03-23 DIAGNOSIS — E876 Hypokalemia: Secondary | ICD-10-CM | POA: Diagnosis not present

## 2017-03-23 DIAGNOSIS — N186 End stage renal disease: Secondary | ICD-10-CM | POA: Diagnosis not present

## 2017-03-23 DIAGNOSIS — D630 Anemia in neoplastic disease: Secondary | ICD-10-CM | POA: Diagnosis not present

## 2017-03-23 DIAGNOSIS — E440 Moderate protein-calorie malnutrition: Secondary | ICD-10-CM | POA: Diagnosis not present

## 2017-03-24 DIAGNOSIS — N186 End stage renal disease: Secondary | ICD-10-CM | POA: Diagnosis not present

## 2017-03-24 DIAGNOSIS — R82998 Other abnormal findings in urine: Secondary | ICD-10-CM | POA: Diagnosis not present

## 2017-03-24 DIAGNOSIS — E876 Hypokalemia: Secondary | ICD-10-CM | POA: Diagnosis not present

## 2017-03-24 DIAGNOSIS — N2581 Secondary hyperparathyroidism of renal origin: Secondary | ICD-10-CM | POA: Diagnosis not present

## 2017-03-24 DIAGNOSIS — Z79899 Other long term (current) drug therapy: Secondary | ICD-10-CM | POA: Diagnosis not present

## 2017-03-24 DIAGNOSIS — E440 Moderate protein-calorie malnutrition: Secondary | ICD-10-CM | POA: Diagnosis not present

## 2017-03-24 DIAGNOSIS — D630 Anemia in neoplastic disease: Secondary | ICD-10-CM | POA: Diagnosis not present

## 2017-03-24 DIAGNOSIS — D509 Iron deficiency anemia, unspecified: Secondary | ICD-10-CM | POA: Diagnosis not present

## 2017-03-25 DIAGNOSIS — E876 Hypokalemia: Secondary | ICD-10-CM | POA: Diagnosis not present

## 2017-03-25 DIAGNOSIS — R82998 Other abnormal findings in urine: Secondary | ICD-10-CM | POA: Diagnosis not present

## 2017-03-25 DIAGNOSIS — Z79899 Other long term (current) drug therapy: Secondary | ICD-10-CM | POA: Diagnosis not present

## 2017-03-25 DIAGNOSIS — N186 End stage renal disease: Secondary | ICD-10-CM | POA: Diagnosis not present

## 2017-03-25 DIAGNOSIS — D509 Iron deficiency anemia, unspecified: Secondary | ICD-10-CM | POA: Diagnosis not present

## 2017-03-25 DIAGNOSIS — E440 Moderate protein-calorie malnutrition: Secondary | ICD-10-CM | POA: Diagnosis not present

## 2017-03-25 DIAGNOSIS — N2581 Secondary hyperparathyroidism of renal origin: Secondary | ICD-10-CM | POA: Diagnosis not present

## 2017-03-25 DIAGNOSIS — D630 Anemia in neoplastic disease: Secondary | ICD-10-CM | POA: Diagnosis not present

## 2017-03-26 DIAGNOSIS — R82998 Other abnormal findings in urine: Secondary | ICD-10-CM | POA: Diagnosis not present

## 2017-03-26 DIAGNOSIS — E440 Moderate protein-calorie malnutrition: Secondary | ICD-10-CM | POA: Diagnosis not present

## 2017-03-26 DIAGNOSIS — E876 Hypokalemia: Secondary | ICD-10-CM | POA: Diagnosis not present

## 2017-03-26 DIAGNOSIS — D509 Iron deficiency anemia, unspecified: Secondary | ICD-10-CM | POA: Diagnosis not present

## 2017-03-26 DIAGNOSIS — Z79899 Other long term (current) drug therapy: Secondary | ICD-10-CM | POA: Diagnosis not present

## 2017-03-26 DIAGNOSIS — N186 End stage renal disease: Secondary | ICD-10-CM | POA: Diagnosis not present

## 2017-03-26 DIAGNOSIS — N2581 Secondary hyperparathyroidism of renal origin: Secondary | ICD-10-CM | POA: Diagnosis not present

## 2017-03-26 DIAGNOSIS — D630 Anemia in neoplastic disease: Secondary | ICD-10-CM | POA: Diagnosis not present

## 2017-03-27 DIAGNOSIS — N2581 Secondary hyperparathyroidism of renal origin: Secondary | ICD-10-CM | POA: Diagnosis not present

## 2017-03-27 DIAGNOSIS — E876 Hypokalemia: Secondary | ICD-10-CM | POA: Diagnosis not present

## 2017-03-27 DIAGNOSIS — N186 End stage renal disease: Secondary | ICD-10-CM | POA: Diagnosis not present

## 2017-03-27 DIAGNOSIS — E440 Moderate protein-calorie malnutrition: Secondary | ICD-10-CM | POA: Diagnosis not present

## 2017-03-27 DIAGNOSIS — Z79899 Other long term (current) drug therapy: Secondary | ICD-10-CM | POA: Diagnosis not present

## 2017-03-27 DIAGNOSIS — R82998 Other abnormal findings in urine: Secondary | ICD-10-CM | POA: Diagnosis not present

## 2017-03-27 DIAGNOSIS — D630 Anemia in neoplastic disease: Secondary | ICD-10-CM | POA: Diagnosis not present

## 2017-03-27 DIAGNOSIS — D509 Iron deficiency anemia, unspecified: Secondary | ICD-10-CM | POA: Diagnosis not present

## 2017-03-28 DIAGNOSIS — R82998 Other abnormal findings in urine: Secondary | ICD-10-CM | POA: Diagnosis not present

## 2017-03-28 DIAGNOSIS — N2581 Secondary hyperparathyroidism of renal origin: Secondary | ICD-10-CM | POA: Diagnosis not present

## 2017-03-28 DIAGNOSIS — D630 Anemia in neoplastic disease: Secondary | ICD-10-CM | POA: Diagnosis not present

## 2017-03-28 DIAGNOSIS — E440 Moderate protein-calorie malnutrition: Secondary | ICD-10-CM | POA: Diagnosis not present

## 2017-03-28 DIAGNOSIS — E876 Hypokalemia: Secondary | ICD-10-CM | POA: Diagnosis not present

## 2017-03-28 DIAGNOSIS — Z79899 Other long term (current) drug therapy: Secondary | ICD-10-CM | POA: Diagnosis not present

## 2017-03-28 DIAGNOSIS — D509 Iron deficiency anemia, unspecified: Secondary | ICD-10-CM | POA: Diagnosis not present

## 2017-03-28 DIAGNOSIS — N186 End stage renal disease: Secondary | ICD-10-CM | POA: Diagnosis not present

## 2017-03-29 DIAGNOSIS — D509 Iron deficiency anemia, unspecified: Secondary | ICD-10-CM | POA: Diagnosis not present

## 2017-03-29 DIAGNOSIS — D630 Anemia in neoplastic disease: Secondary | ICD-10-CM | POA: Diagnosis not present

## 2017-03-29 DIAGNOSIS — R82998 Other abnormal findings in urine: Secondary | ICD-10-CM | POA: Diagnosis not present

## 2017-03-29 DIAGNOSIS — E876 Hypokalemia: Secondary | ICD-10-CM | POA: Diagnosis not present

## 2017-03-29 DIAGNOSIS — E440 Moderate protein-calorie malnutrition: Secondary | ICD-10-CM | POA: Diagnosis not present

## 2017-03-29 DIAGNOSIS — Z79899 Other long term (current) drug therapy: Secondary | ICD-10-CM | POA: Diagnosis not present

## 2017-03-29 DIAGNOSIS — N2581 Secondary hyperparathyroidism of renal origin: Secondary | ICD-10-CM | POA: Diagnosis not present

## 2017-03-29 DIAGNOSIS — N186 End stage renal disease: Secondary | ICD-10-CM | POA: Diagnosis not present

## 2017-03-30 DIAGNOSIS — E440 Moderate protein-calorie malnutrition: Secondary | ICD-10-CM | POA: Diagnosis not present

## 2017-03-30 DIAGNOSIS — E876 Hypokalemia: Secondary | ICD-10-CM | POA: Diagnosis not present

## 2017-03-30 DIAGNOSIS — D509 Iron deficiency anemia, unspecified: Secondary | ICD-10-CM | POA: Diagnosis not present

## 2017-03-30 DIAGNOSIS — N186 End stage renal disease: Secondary | ICD-10-CM | POA: Diagnosis not present

## 2017-03-30 DIAGNOSIS — Z79899 Other long term (current) drug therapy: Secondary | ICD-10-CM | POA: Diagnosis not present

## 2017-03-30 DIAGNOSIS — D630 Anemia in neoplastic disease: Secondary | ICD-10-CM | POA: Diagnosis not present

## 2017-03-30 DIAGNOSIS — N2581 Secondary hyperparathyroidism of renal origin: Secondary | ICD-10-CM | POA: Diagnosis not present

## 2017-03-30 DIAGNOSIS — R82998 Other abnormal findings in urine: Secondary | ICD-10-CM | POA: Diagnosis not present

## 2017-03-31 DIAGNOSIS — N2581 Secondary hyperparathyroidism of renal origin: Secondary | ICD-10-CM | POA: Diagnosis not present

## 2017-03-31 DIAGNOSIS — R82998 Other abnormal findings in urine: Secondary | ICD-10-CM | POA: Diagnosis not present

## 2017-03-31 DIAGNOSIS — Z79899 Other long term (current) drug therapy: Secondary | ICD-10-CM | POA: Diagnosis not present

## 2017-03-31 DIAGNOSIS — D509 Iron deficiency anemia, unspecified: Secondary | ICD-10-CM | POA: Diagnosis not present

## 2017-03-31 DIAGNOSIS — E876 Hypokalemia: Secondary | ICD-10-CM | POA: Diagnosis not present

## 2017-03-31 DIAGNOSIS — D630 Anemia in neoplastic disease: Secondary | ICD-10-CM | POA: Diagnosis not present

## 2017-03-31 DIAGNOSIS — N186 End stage renal disease: Secondary | ICD-10-CM | POA: Diagnosis not present

## 2017-03-31 DIAGNOSIS — E440 Moderate protein-calorie malnutrition: Secondary | ICD-10-CM | POA: Diagnosis not present

## 2017-04-01 DIAGNOSIS — D630 Anemia in neoplastic disease: Secondary | ICD-10-CM | POA: Diagnosis not present

## 2017-04-01 DIAGNOSIS — N186 End stage renal disease: Secondary | ICD-10-CM | POA: Diagnosis not present

## 2017-04-01 DIAGNOSIS — R82998 Other abnormal findings in urine: Secondary | ICD-10-CM | POA: Diagnosis not present

## 2017-04-01 DIAGNOSIS — D509 Iron deficiency anemia, unspecified: Secondary | ICD-10-CM | POA: Diagnosis not present

## 2017-04-01 DIAGNOSIS — E876 Hypokalemia: Secondary | ICD-10-CM | POA: Diagnosis not present

## 2017-04-01 DIAGNOSIS — Z79899 Other long term (current) drug therapy: Secondary | ICD-10-CM | POA: Diagnosis not present

## 2017-04-01 DIAGNOSIS — E440 Moderate protein-calorie malnutrition: Secondary | ICD-10-CM | POA: Diagnosis not present

## 2017-04-01 DIAGNOSIS — N2581 Secondary hyperparathyroidism of renal origin: Secondary | ICD-10-CM | POA: Diagnosis not present

## 2017-04-02 DIAGNOSIS — D509 Iron deficiency anemia, unspecified: Secondary | ICD-10-CM | POA: Diagnosis not present

## 2017-04-02 DIAGNOSIS — N2581 Secondary hyperparathyroidism of renal origin: Secondary | ICD-10-CM | POA: Diagnosis not present

## 2017-04-02 DIAGNOSIS — R82998 Other abnormal findings in urine: Secondary | ICD-10-CM | POA: Diagnosis not present

## 2017-04-02 DIAGNOSIS — Z79899 Other long term (current) drug therapy: Secondary | ICD-10-CM | POA: Diagnosis not present

## 2017-04-02 DIAGNOSIS — E876 Hypokalemia: Secondary | ICD-10-CM | POA: Diagnosis not present

## 2017-04-02 DIAGNOSIS — N186 End stage renal disease: Secondary | ICD-10-CM | POA: Diagnosis not present

## 2017-04-02 DIAGNOSIS — E440 Moderate protein-calorie malnutrition: Secondary | ICD-10-CM | POA: Diagnosis not present

## 2017-04-02 DIAGNOSIS — D630 Anemia in neoplastic disease: Secondary | ICD-10-CM | POA: Diagnosis not present

## 2017-04-03 DIAGNOSIS — E876 Hypokalemia: Secondary | ICD-10-CM | POA: Diagnosis not present

## 2017-04-03 DIAGNOSIS — N186 End stage renal disease: Secondary | ICD-10-CM | POA: Diagnosis not present

## 2017-04-03 DIAGNOSIS — E440 Moderate protein-calorie malnutrition: Secondary | ICD-10-CM | POA: Diagnosis not present

## 2017-04-03 DIAGNOSIS — D509 Iron deficiency anemia, unspecified: Secondary | ICD-10-CM | POA: Diagnosis not present

## 2017-04-03 DIAGNOSIS — Z79899 Other long term (current) drug therapy: Secondary | ICD-10-CM | POA: Diagnosis not present

## 2017-04-03 DIAGNOSIS — R82998 Other abnormal findings in urine: Secondary | ICD-10-CM | POA: Diagnosis not present

## 2017-04-03 DIAGNOSIS — N2581 Secondary hyperparathyroidism of renal origin: Secondary | ICD-10-CM | POA: Diagnosis not present

## 2017-04-03 DIAGNOSIS — D630 Anemia in neoplastic disease: Secondary | ICD-10-CM | POA: Diagnosis not present

## 2017-04-04 DIAGNOSIS — R82998 Other abnormal findings in urine: Secondary | ICD-10-CM | POA: Diagnosis not present

## 2017-04-04 DIAGNOSIS — D509 Iron deficiency anemia, unspecified: Secondary | ICD-10-CM | POA: Diagnosis not present

## 2017-04-04 DIAGNOSIS — N186 End stage renal disease: Secondary | ICD-10-CM | POA: Diagnosis not present

## 2017-04-04 DIAGNOSIS — Z79899 Other long term (current) drug therapy: Secondary | ICD-10-CM | POA: Diagnosis not present

## 2017-04-04 DIAGNOSIS — N2581 Secondary hyperparathyroidism of renal origin: Secondary | ICD-10-CM | POA: Diagnosis not present

## 2017-04-04 DIAGNOSIS — E876 Hypokalemia: Secondary | ICD-10-CM | POA: Diagnosis not present

## 2017-04-04 DIAGNOSIS — D630 Anemia in neoplastic disease: Secondary | ICD-10-CM | POA: Diagnosis not present

## 2017-04-04 DIAGNOSIS — E440 Moderate protein-calorie malnutrition: Secondary | ICD-10-CM | POA: Diagnosis not present

## 2017-04-05 DIAGNOSIS — D509 Iron deficiency anemia, unspecified: Secondary | ICD-10-CM | POA: Diagnosis not present

## 2017-04-05 DIAGNOSIS — R82998 Other abnormal findings in urine: Secondary | ICD-10-CM | POA: Diagnosis not present

## 2017-04-05 DIAGNOSIS — D630 Anemia in neoplastic disease: Secondary | ICD-10-CM | POA: Diagnosis not present

## 2017-04-05 DIAGNOSIS — Z79899 Other long term (current) drug therapy: Secondary | ICD-10-CM | POA: Diagnosis not present

## 2017-04-05 DIAGNOSIS — E876 Hypokalemia: Secondary | ICD-10-CM | POA: Diagnosis not present

## 2017-04-05 DIAGNOSIS — N2581 Secondary hyperparathyroidism of renal origin: Secondary | ICD-10-CM | POA: Diagnosis not present

## 2017-04-05 DIAGNOSIS — E440 Moderate protein-calorie malnutrition: Secondary | ICD-10-CM | POA: Diagnosis not present

## 2017-04-05 DIAGNOSIS — N186 End stage renal disease: Secondary | ICD-10-CM | POA: Diagnosis not present

## 2017-04-06 DIAGNOSIS — E440 Moderate protein-calorie malnutrition: Secondary | ICD-10-CM | POA: Diagnosis not present

## 2017-04-06 DIAGNOSIS — D509 Iron deficiency anemia, unspecified: Secondary | ICD-10-CM | POA: Diagnosis not present

## 2017-04-06 DIAGNOSIS — N2581 Secondary hyperparathyroidism of renal origin: Secondary | ICD-10-CM | POA: Diagnosis not present

## 2017-04-06 DIAGNOSIS — R82998 Other abnormal findings in urine: Secondary | ICD-10-CM | POA: Diagnosis not present

## 2017-04-06 DIAGNOSIS — Z79899 Other long term (current) drug therapy: Secondary | ICD-10-CM | POA: Diagnosis not present

## 2017-04-06 DIAGNOSIS — E876 Hypokalemia: Secondary | ICD-10-CM | POA: Diagnosis not present

## 2017-04-06 DIAGNOSIS — N186 End stage renal disease: Secondary | ICD-10-CM | POA: Diagnosis not present

## 2017-04-06 DIAGNOSIS — D630 Anemia in neoplastic disease: Secondary | ICD-10-CM | POA: Diagnosis not present

## 2017-04-07 DIAGNOSIS — N186 End stage renal disease: Secondary | ICD-10-CM | POA: Diagnosis not present

## 2017-04-07 DIAGNOSIS — Z992 Dependence on renal dialysis: Secondary | ICD-10-CM | POA: Diagnosis not present

## 2017-04-07 DIAGNOSIS — E440 Moderate protein-calorie malnutrition: Secondary | ICD-10-CM | POA: Diagnosis not present

## 2017-04-07 DIAGNOSIS — N2581 Secondary hyperparathyroidism of renal origin: Secondary | ICD-10-CM | POA: Diagnosis not present

## 2017-04-07 DIAGNOSIS — E876 Hypokalemia: Secondary | ICD-10-CM | POA: Diagnosis not present

## 2017-04-07 DIAGNOSIS — R82998 Other abnormal findings in urine: Secondary | ICD-10-CM | POA: Diagnosis not present

## 2017-04-07 DIAGNOSIS — I150 Renovascular hypertension: Secondary | ICD-10-CM | POA: Diagnosis not present

## 2017-04-07 DIAGNOSIS — Z79899 Other long term (current) drug therapy: Secondary | ICD-10-CM | POA: Diagnosis not present

## 2017-04-07 DIAGNOSIS — D509 Iron deficiency anemia, unspecified: Secondary | ICD-10-CM | POA: Diagnosis not present

## 2017-04-07 DIAGNOSIS — D630 Anemia in neoplastic disease: Secondary | ICD-10-CM | POA: Diagnosis not present

## 2017-04-08 DIAGNOSIS — D630 Anemia in neoplastic disease: Secondary | ICD-10-CM | POA: Diagnosis not present

## 2017-04-08 DIAGNOSIS — N2581 Secondary hyperparathyroidism of renal origin: Secondary | ICD-10-CM | POA: Diagnosis not present

## 2017-04-08 DIAGNOSIS — D509 Iron deficiency anemia, unspecified: Secondary | ICD-10-CM | POA: Diagnosis not present

## 2017-04-08 DIAGNOSIS — E440 Moderate protein-calorie malnutrition: Secondary | ICD-10-CM | POA: Diagnosis not present

## 2017-04-08 DIAGNOSIS — Z79899 Other long term (current) drug therapy: Secondary | ICD-10-CM | POA: Diagnosis not present

## 2017-04-08 DIAGNOSIS — N186 End stage renal disease: Secondary | ICD-10-CM | POA: Diagnosis not present

## 2017-04-08 DIAGNOSIS — R82998 Other abnormal findings in urine: Secondary | ICD-10-CM | POA: Diagnosis not present

## 2017-04-08 DIAGNOSIS — Z4932 Encounter for adequacy testing for peritoneal dialysis: Secondary | ICD-10-CM | POA: Diagnosis not present

## 2017-04-09 DIAGNOSIS — N186 End stage renal disease: Secondary | ICD-10-CM | POA: Diagnosis not present

## 2017-04-09 DIAGNOSIS — N2581 Secondary hyperparathyroidism of renal origin: Secondary | ICD-10-CM | POA: Diagnosis not present

## 2017-04-09 DIAGNOSIS — R82998 Other abnormal findings in urine: Secondary | ICD-10-CM | POA: Diagnosis not present

## 2017-04-09 DIAGNOSIS — Z79899 Other long term (current) drug therapy: Secondary | ICD-10-CM | POA: Diagnosis not present

## 2017-04-09 DIAGNOSIS — E440 Moderate protein-calorie malnutrition: Secondary | ICD-10-CM | POA: Diagnosis not present

## 2017-04-09 DIAGNOSIS — D630 Anemia in neoplastic disease: Secondary | ICD-10-CM | POA: Diagnosis not present

## 2017-04-09 DIAGNOSIS — D509 Iron deficiency anemia, unspecified: Secondary | ICD-10-CM | POA: Diagnosis not present

## 2017-04-09 DIAGNOSIS — Z4932 Encounter for adequacy testing for peritoneal dialysis: Secondary | ICD-10-CM | POA: Diagnosis not present

## 2017-04-10 DIAGNOSIS — R82998 Other abnormal findings in urine: Secondary | ICD-10-CM | POA: Diagnosis not present

## 2017-04-10 DIAGNOSIS — J310 Chronic rhinitis: Secondary | ICD-10-CM | POA: Diagnosis not present

## 2017-04-10 DIAGNOSIS — J329 Chronic sinusitis, unspecified: Secondary | ICD-10-CM | POA: Diagnosis not present

## 2017-04-10 DIAGNOSIS — D630 Anemia in neoplastic disease: Secondary | ICD-10-CM | POA: Diagnosis not present

## 2017-04-10 DIAGNOSIS — E440 Moderate protein-calorie malnutrition: Secondary | ICD-10-CM | POA: Diagnosis not present

## 2017-04-10 DIAGNOSIS — Z4932 Encounter for adequacy testing for peritoneal dialysis: Secondary | ICD-10-CM | POA: Diagnosis not present

## 2017-04-10 DIAGNOSIS — N186 End stage renal disease: Secondary | ICD-10-CM | POA: Diagnosis not present

## 2017-04-10 DIAGNOSIS — D509 Iron deficiency anemia, unspecified: Secondary | ICD-10-CM | POA: Diagnosis not present

## 2017-04-10 DIAGNOSIS — Z79899 Other long term (current) drug therapy: Secondary | ICD-10-CM | POA: Diagnosis not present

## 2017-04-10 DIAGNOSIS — N2581 Secondary hyperparathyroidism of renal origin: Secondary | ICD-10-CM | POA: Diagnosis not present

## 2017-04-11 DIAGNOSIS — E440 Moderate protein-calorie malnutrition: Secondary | ICD-10-CM | POA: Diagnosis not present

## 2017-04-11 DIAGNOSIS — D509 Iron deficiency anemia, unspecified: Secondary | ICD-10-CM | POA: Diagnosis not present

## 2017-04-11 DIAGNOSIS — N186 End stage renal disease: Secondary | ICD-10-CM | POA: Diagnosis not present

## 2017-04-11 DIAGNOSIS — D630 Anemia in neoplastic disease: Secondary | ICD-10-CM | POA: Diagnosis not present

## 2017-04-11 DIAGNOSIS — R82998 Other abnormal findings in urine: Secondary | ICD-10-CM | POA: Diagnosis not present

## 2017-04-11 DIAGNOSIS — Z79899 Other long term (current) drug therapy: Secondary | ICD-10-CM | POA: Diagnosis not present

## 2017-04-11 DIAGNOSIS — Z4932 Encounter for adequacy testing for peritoneal dialysis: Secondary | ICD-10-CM | POA: Diagnosis not present

## 2017-04-11 DIAGNOSIS — N2581 Secondary hyperparathyroidism of renal origin: Secondary | ICD-10-CM | POA: Diagnosis not present

## 2017-04-12 DIAGNOSIS — E440 Moderate protein-calorie malnutrition: Secondary | ICD-10-CM | POA: Diagnosis not present

## 2017-04-12 DIAGNOSIS — D630 Anemia in neoplastic disease: Secondary | ICD-10-CM | POA: Diagnosis not present

## 2017-04-12 DIAGNOSIS — R82998 Other abnormal findings in urine: Secondary | ICD-10-CM | POA: Diagnosis not present

## 2017-04-12 DIAGNOSIS — D509 Iron deficiency anemia, unspecified: Secondary | ICD-10-CM | POA: Diagnosis not present

## 2017-04-12 DIAGNOSIS — Z79899 Other long term (current) drug therapy: Secondary | ICD-10-CM | POA: Diagnosis not present

## 2017-04-12 DIAGNOSIS — N186 End stage renal disease: Secondary | ICD-10-CM | POA: Diagnosis not present

## 2017-04-12 DIAGNOSIS — Z4932 Encounter for adequacy testing for peritoneal dialysis: Secondary | ICD-10-CM | POA: Diagnosis not present

## 2017-04-12 DIAGNOSIS — N2581 Secondary hyperparathyroidism of renal origin: Secondary | ICD-10-CM | POA: Diagnosis not present

## 2017-04-13 DIAGNOSIS — R6889 Other general symptoms and signs: Secondary | ICD-10-CM | POA: Diagnosis not present

## 2017-04-13 DIAGNOSIS — R82998 Other abnormal findings in urine: Secondary | ICD-10-CM | POA: Diagnosis not present

## 2017-04-13 DIAGNOSIS — Z4932 Encounter for adequacy testing for peritoneal dialysis: Secondary | ICD-10-CM | POA: Diagnosis not present

## 2017-04-13 DIAGNOSIS — N2581 Secondary hyperparathyroidism of renal origin: Secondary | ICD-10-CM | POA: Diagnosis not present

## 2017-04-13 DIAGNOSIS — D509 Iron deficiency anemia, unspecified: Secondary | ICD-10-CM | POA: Diagnosis not present

## 2017-04-13 DIAGNOSIS — N186 End stage renal disease: Secondary | ICD-10-CM | POA: Diagnosis not present

## 2017-04-13 DIAGNOSIS — E440 Moderate protein-calorie malnutrition: Secondary | ICD-10-CM | POA: Diagnosis not present

## 2017-04-13 DIAGNOSIS — D630 Anemia in neoplastic disease: Secondary | ICD-10-CM | POA: Diagnosis not present

## 2017-04-13 DIAGNOSIS — Z79899 Other long term (current) drug therapy: Secondary | ICD-10-CM | POA: Diagnosis not present

## 2017-04-14 DIAGNOSIS — Z4932 Encounter for adequacy testing for peritoneal dialysis: Secondary | ICD-10-CM | POA: Diagnosis not present

## 2017-04-14 DIAGNOSIS — N186 End stage renal disease: Secondary | ICD-10-CM | POA: Diagnosis not present

## 2017-04-14 DIAGNOSIS — D630 Anemia in neoplastic disease: Secondary | ICD-10-CM | POA: Diagnosis not present

## 2017-04-14 DIAGNOSIS — D509 Iron deficiency anemia, unspecified: Secondary | ICD-10-CM | POA: Diagnosis not present

## 2017-04-14 DIAGNOSIS — R82998 Other abnormal findings in urine: Secondary | ICD-10-CM | POA: Diagnosis not present

## 2017-04-14 DIAGNOSIS — E440 Moderate protein-calorie malnutrition: Secondary | ICD-10-CM | POA: Diagnosis not present

## 2017-04-14 DIAGNOSIS — N2581 Secondary hyperparathyroidism of renal origin: Secondary | ICD-10-CM | POA: Diagnosis not present

## 2017-04-14 DIAGNOSIS — Z79899 Other long term (current) drug therapy: Secondary | ICD-10-CM | POA: Diagnosis not present

## 2017-04-15 DIAGNOSIS — R82998 Other abnormal findings in urine: Secondary | ICD-10-CM | POA: Diagnosis not present

## 2017-04-15 DIAGNOSIS — D630 Anemia in neoplastic disease: Secondary | ICD-10-CM | POA: Diagnosis not present

## 2017-04-15 DIAGNOSIS — Z4932 Encounter for adequacy testing for peritoneal dialysis: Secondary | ICD-10-CM | POA: Diagnosis not present

## 2017-04-15 DIAGNOSIS — N2581 Secondary hyperparathyroidism of renal origin: Secondary | ICD-10-CM | POA: Diagnosis not present

## 2017-04-15 DIAGNOSIS — D509 Iron deficiency anemia, unspecified: Secondary | ICD-10-CM | POA: Diagnosis not present

## 2017-04-15 DIAGNOSIS — Z79899 Other long term (current) drug therapy: Secondary | ICD-10-CM | POA: Diagnosis not present

## 2017-04-15 DIAGNOSIS — N186 End stage renal disease: Secondary | ICD-10-CM | POA: Diagnosis not present

## 2017-04-15 DIAGNOSIS — E440 Moderate protein-calorie malnutrition: Secondary | ICD-10-CM | POA: Diagnosis not present

## 2017-04-16 DIAGNOSIS — E440 Moderate protein-calorie malnutrition: Secondary | ICD-10-CM | POA: Diagnosis not present

## 2017-04-16 DIAGNOSIS — N2581 Secondary hyperparathyroidism of renal origin: Secondary | ICD-10-CM | POA: Diagnosis not present

## 2017-04-16 DIAGNOSIS — R82998 Other abnormal findings in urine: Secondary | ICD-10-CM | POA: Diagnosis not present

## 2017-04-16 DIAGNOSIS — D509 Iron deficiency anemia, unspecified: Secondary | ICD-10-CM | POA: Diagnosis not present

## 2017-04-16 DIAGNOSIS — N186 End stage renal disease: Secondary | ICD-10-CM | POA: Diagnosis not present

## 2017-04-16 DIAGNOSIS — Z4932 Encounter for adequacy testing for peritoneal dialysis: Secondary | ICD-10-CM | POA: Diagnosis not present

## 2017-04-16 DIAGNOSIS — Z79899 Other long term (current) drug therapy: Secondary | ICD-10-CM | POA: Diagnosis not present

## 2017-04-16 DIAGNOSIS — D630 Anemia in neoplastic disease: Secondary | ICD-10-CM | POA: Diagnosis not present

## 2017-04-17 DIAGNOSIS — N2581 Secondary hyperparathyroidism of renal origin: Secondary | ICD-10-CM | POA: Diagnosis not present

## 2017-04-17 DIAGNOSIS — N186 End stage renal disease: Secondary | ICD-10-CM | POA: Diagnosis not present

## 2017-04-17 DIAGNOSIS — Z79899 Other long term (current) drug therapy: Secondary | ICD-10-CM | POA: Diagnosis not present

## 2017-04-17 DIAGNOSIS — E440 Moderate protein-calorie malnutrition: Secondary | ICD-10-CM | POA: Diagnosis not present

## 2017-04-17 DIAGNOSIS — R82998 Other abnormal findings in urine: Secondary | ICD-10-CM | POA: Diagnosis not present

## 2017-04-17 DIAGNOSIS — D630 Anemia in neoplastic disease: Secondary | ICD-10-CM | POA: Diagnosis not present

## 2017-04-17 DIAGNOSIS — D509 Iron deficiency anemia, unspecified: Secondary | ICD-10-CM | POA: Diagnosis not present

## 2017-04-17 DIAGNOSIS — Z4932 Encounter for adequacy testing for peritoneal dialysis: Secondary | ICD-10-CM | POA: Diagnosis not present

## 2017-04-18 DIAGNOSIS — D509 Iron deficiency anemia, unspecified: Secondary | ICD-10-CM | POA: Diagnosis not present

## 2017-04-18 DIAGNOSIS — N186 End stage renal disease: Secondary | ICD-10-CM | POA: Diagnosis not present

## 2017-04-18 DIAGNOSIS — R82998 Other abnormal findings in urine: Secondary | ICD-10-CM | POA: Diagnosis not present

## 2017-04-18 DIAGNOSIS — D630 Anemia in neoplastic disease: Secondary | ICD-10-CM | POA: Diagnosis not present

## 2017-04-18 DIAGNOSIS — Z4932 Encounter for adequacy testing for peritoneal dialysis: Secondary | ICD-10-CM | POA: Diagnosis not present

## 2017-04-18 DIAGNOSIS — N2581 Secondary hyperparathyroidism of renal origin: Secondary | ICD-10-CM | POA: Diagnosis not present

## 2017-04-18 DIAGNOSIS — Z79899 Other long term (current) drug therapy: Secondary | ICD-10-CM | POA: Diagnosis not present

## 2017-04-18 DIAGNOSIS — E440 Moderate protein-calorie malnutrition: Secondary | ICD-10-CM | POA: Diagnosis not present

## 2017-04-19 DIAGNOSIS — D630 Anemia in neoplastic disease: Secondary | ICD-10-CM | POA: Diagnosis not present

## 2017-04-19 DIAGNOSIS — E440 Moderate protein-calorie malnutrition: Secondary | ICD-10-CM | POA: Diagnosis not present

## 2017-04-19 DIAGNOSIS — Z4932 Encounter for adequacy testing for peritoneal dialysis: Secondary | ICD-10-CM | POA: Diagnosis not present

## 2017-04-19 DIAGNOSIS — D509 Iron deficiency anemia, unspecified: Secondary | ICD-10-CM | POA: Diagnosis not present

## 2017-04-19 DIAGNOSIS — N186 End stage renal disease: Secondary | ICD-10-CM | POA: Diagnosis not present

## 2017-04-19 DIAGNOSIS — R82998 Other abnormal findings in urine: Secondary | ICD-10-CM | POA: Diagnosis not present

## 2017-04-19 DIAGNOSIS — Z79899 Other long term (current) drug therapy: Secondary | ICD-10-CM | POA: Diagnosis not present

## 2017-04-19 DIAGNOSIS — N2581 Secondary hyperparathyroidism of renal origin: Secondary | ICD-10-CM | POA: Diagnosis not present

## 2017-04-20 DIAGNOSIS — Z79899 Other long term (current) drug therapy: Secondary | ICD-10-CM | POA: Diagnosis not present

## 2017-04-20 DIAGNOSIS — E440 Moderate protein-calorie malnutrition: Secondary | ICD-10-CM | POA: Diagnosis not present

## 2017-04-20 DIAGNOSIS — D509 Iron deficiency anemia, unspecified: Secondary | ICD-10-CM | POA: Diagnosis not present

## 2017-04-20 DIAGNOSIS — D630 Anemia in neoplastic disease: Secondary | ICD-10-CM | POA: Diagnosis not present

## 2017-04-20 DIAGNOSIS — N186 End stage renal disease: Secondary | ICD-10-CM | POA: Diagnosis not present

## 2017-04-20 DIAGNOSIS — R82998 Other abnormal findings in urine: Secondary | ICD-10-CM | POA: Diagnosis not present

## 2017-04-20 DIAGNOSIS — N2581 Secondary hyperparathyroidism of renal origin: Secondary | ICD-10-CM | POA: Diagnosis not present

## 2017-04-20 DIAGNOSIS — Z4932 Encounter for adequacy testing for peritoneal dialysis: Secondary | ICD-10-CM | POA: Diagnosis not present

## 2017-04-21 DIAGNOSIS — N2581 Secondary hyperparathyroidism of renal origin: Secondary | ICD-10-CM | POA: Diagnosis not present

## 2017-04-21 DIAGNOSIS — N186 End stage renal disease: Secondary | ICD-10-CM | POA: Diagnosis not present

## 2017-04-21 DIAGNOSIS — Z79899 Other long term (current) drug therapy: Secondary | ICD-10-CM | POA: Diagnosis not present

## 2017-04-21 DIAGNOSIS — Z4932 Encounter for adequacy testing for peritoneal dialysis: Secondary | ICD-10-CM | POA: Diagnosis not present

## 2017-04-21 DIAGNOSIS — D630 Anemia in neoplastic disease: Secondary | ICD-10-CM | POA: Diagnosis not present

## 2017-04-21 DIAGNOSIS — R82998 Other abnormal findings in urine: Secondary | ICD-10-CM | POA: Diagnosis not present

## 2017-04-21 DIAGNOSIS — D509 Iron deficiency anemia, unspecified: Secondary | ICD-10-CM | POA: Diagnosis not present

## 2017-04-21 DIAGNOSIS — E440 Moderate protein-calorie malnutrition: Secondary | ICD-10-CM | POA: Diagnosis not present

## 2017-04-22 DIAGNOSIS — N2581 Secondary hyperparathyroidism of renal origin: Secondary | ICD-10-CM | POA: Diagnosis not present

## 2017-04-22 DIAGNOSIS — D509 Iron deficiency anemia, unspecified: Secondary | ICD-10-CM | POA: Diagnosis not present

## 2017-04-22 DIAGNOSIS — D630 Anemia in neoplastic disease: Secondary | ICD-10-CM | POA: Diagnosis not present

## 2017-04-22 DIAGNOSIS — E440 Moderate protein-calorie malnutrition: Secondary | ICD-10-CM | POA: Diagnosis not present

## 2017-04-22 DIAGNOSIS — Z79899 Other long term (current) drug therapy: Secondary | ICD-10-CM | POA: Diagnosis not present

## 2017-04-22 DIAGNOSIS — Z4932 Encounter for adequacy testing for peritoneal dialysis: Secondary | ICD-10-CM | POA: Diagnosis not present

## 2017-04-22 DIAGNOSIS — N186 End stage renal disease: Secondary | ICD-10-CM | POA: Diagnosis not present

## 2017-04-22 DIAGNOSIS — R82998 Other abnormal findings in urine: Secondary | ICD-10-CM | POA: Diagnosis not present

## 2017-04-23 DIAGNOSIS — N2581 Secondary hyperparathyroidism of renal origin: Secondary | ICD-10-CM | POA: Diagnosis not present

## 2017-04-23 DIAGNOSIS — Z79899 Other long term (current) drug therapy: Secondary | ICD-10-CM | POA: Diagnosis not present

## 2017-04-23 DIAGNOSIS — Z4932 Encounter for adequacy testing for peritoneal dialysis: Secondary | ICD-10-CM | POA: Diagnosis not present

## 2017-04-23 DIAGNOSIS — D630 Anemia in neoplastic disease: Secondary | ICD-10-CM | POA: Diagnosis not present

## 2017-04-23 DIAGNOSIS — E440 Moderate protein-calorie malnutrition: Secondary | ICD-10-CM | POA: Diagnosis not present

## 2017-04-23 DIAGNOSIS — N186 End stage renal disease: Secondary | ICD-10-CM | POA: Diagnosis not present

## 2017-04-23 DIAGNOSIS — R82998 Other abnormal findings in urine: Secondary | ICD-10-CM | POA: Diagnosis not present

## 2017-04-23 DIAGNOSIS — D509 Iron deficiency anemia, unspecified: Secondary | ICD-10-CM | POA: Diagnosis not present

## 2017-04-24 DIAGNOSIS — E440 Moderate protein-calorie malnutrition: Secondary | ICD-10-CM | POA: Diagnosis not present

## 2017-04-24 DIAGNOSIS — D509 Iron deficiency anemia, unspecified: Secondary | ICD-10-CM | POA: Diagnosis not present

## 2017-04-24 DIAGNOSIS — R6889 Other general symptoms and signs: Secondary | ICD-10-CM | POA: Diagnosis not present

## 2017-04-24 DIAGNOSIS — Z4932 Encounter for adequacy testing for peritoneal dialysis: Secondary | ICD-10-CM | POA: Diagnosis not present

## 2017-04-24 DIAGNOSIS — N2581 Secondary hyperparathyroidism of renal origin: Secondary | ICD-10-CM | POA: Diagnosis not present

## 2017-04-24 DIAGNOSIS — D630 Anemia in neoplastic disease: Secondary | ICD-10-CM | POA: Diagnosis not present

## 2017-04-24 DIAGNOSIS — R82998 Other abnormal findings in urine: Secondary | ICD-10-CM | POA: Diagnosis not present

## 2017-04-24 DIAGNOSIS — N186 End stage renal disease: Secondary | ICD-10-CM | POA: Diagnosis not present

## 2017-04-24 DIAGNOSIS — Z79899 Other long term (current) drug therapy: Secondary | ICD-10-CM | POA: Diagnosis not present

## 2017-04-25 DIAGNOSIS — E440 Moderate protein-calorie malnutrition: Secondary | ICD-10-CM | POA: Diagnosis not present

## 2017-04-25 DIAGNOSIS — Z79899 Other long term (current) drug therapy: Secondary | ICD-10-CM | POA: Diagnosis not present

## 2017-04-25 DIAGNOSIS — N2581 Secondary hyperparathyroidism of renal origin: Secondary | ICD-10-CM | POA: Diagnosis not present

## 2017-04-25 DIAGNOSIS — R82998 Other abnormal findings in urine: Secondary | ICD-10-CM | POA: Diagnosis not present

## 2017-04-25 DIAGNOSIS — N186 End stage renal disease: Secondary | ICD-10-CM | POA: Diagnosis not present

## 2017-04-25 DIAGNOSIS — D509 Iron deficiency anemia, unspecified: Secondary | ICD-10-CM | POA: Diagnosis not present

## 2017-04-25 DIAGNOSIS — D630 Anemia in neoplastic disease: Secondary | ICD-10-CM | POA: Diagnosis not present

## 2017-04-25 DIAGNOSIS — Z4932 Encounter for adequacy testing for peritoneal dialysis: Secondary | ICD-10-CM | POA: Diagnosis not present

## 2017-04-26 DIAGNOSIS — E440 Moderate protein-calorie malnutrition: Secondary | ICD-10-CM | POA: Diagnosis not present

## 2017-04-26 DIAGNOSIS — D630 Anemia in neoplastic disease: Secondary | ICD-10-CM | POA: Diagnosis not present

## 2017-04-26 DIAGNOSIS — D509 Iron deficiency anemia, unspecified: Secondary | ICD-10-CM | POA: Diagnosis not present

## 2017-04-26 DIAGNOSIS — R82998 Other abnormal findings in urine: Secondary | ICD-10-CM | POA: Diagnosis not present

## 2017-04-26 DIAGNOSIS — Z79899 Other long term (current) drug therapy: Secondary | ICD-10-CM | POA: Diagnosis not present

## 2017-04-26 DIAGNOSIS — N2581 Secondary hyperparathyroidism of renal origin: Secondary | ICD-10-CM | POA: Diagnosis not present

## 2017-04-26 DIAGNOSIS — N186 End stage renal disease: Secondary | ICD-10-CM | POA: Diagnosis not present

## 2017-04-26 DIAGNOSIS — Z4932 Encounter for adequacy testing for peritoneal dialysis: Secondary | ICD-10-CM | POA: Diagnosis not present

## 2017-04-27 DIAGNOSIS — Z79899 Other long term (current) drug therapy: Secondary | ICD-10-CM | POA: Diagnosis not present

## 2017-04-27 DIAGNOSIS — D630 Anemia in neoplastic disease: Secondary | ICD-10-CM | POA: Diagnosis not present

## 2017-04-27 DIAGNOSIS — Z4932 Encounter for adequacy testing for peritoneal dialysis: Secondary | ICD-10-CM | POA: Diagnosis not present

## 2017-04-27 DIAGNOSIS — D509 Iron deficiency anemia, unspecified: Secondary | ICD-10-CM | POA: Diagnosis not present

## 2017-04-27 DIAGNOSIS — E440 Moderate protein-calorie malnutrition: Secondary | ICD-10-CM | POA: Diagnosis not present

## 2017-04-27 DIAGNOSIS — N2581 Secondary hyperparathyroidism of renal origin: Secondary | ICD-10-CM | POA: Diagnosis not present

## 2017-04-27 DIAGNOSIS — N186 End stage renal disease: Secondary | ICD-10-CM | POA: Diagnosis not present

## 2017-04-27 DIAGNOSIS — R82998 Other abnormal findings in urine: Secondary | ICD-10-CM | POA: Diagnosis not present

## 2017-04-28 DIAGNOSIS — R82998 Other abnormal findings in urine: Secondary | ICD-10-CM | POA: Diagnosis not present

## 2017-04-28 DIAGNOSIS — N2581 Secondary hyperparathyroidism of renal origin: Secondary | ICD-10-CM | POA: Diagnosis not present

## 2017-04-28 DIAGNOSIS — E440 Moderate protein-calorie malnutrition: Secondary | ICD-10-CM | POA: Diagnosis not present

## 2017-04-28 DIAGNOSIS — Z79899 Other long term (current) drug therapy: Secondary | ICD-10-CM | POA: Diagnosis not present

## 2017-04-28 DIAGNOSIS — D630 Anemia in neoplastic disease: Secondary | ICD-10-CM | POA: Diagnosis not present

## 2017-04-28 DIAGNOSIS — Z4932 Encounter for adequacy testing for peritoneal dialysis: Secondary | ICD-10-CM | POA: Diagnosis not present

## 2017-04-28 DIAGNOSIS — N186 End stage renal disease: Secondary | ICD-10-CM | POA: Diagnosis not present

## 2017-04-28 DIAGNOSIS — D509 Iron deficiency anemia, unspecified: Secondary | ICD-10-CM | POA: Diagnosis not present

## 2017-04-29 DIAGNOSIS — D630 Anemia in neoplastic disease: Secondary | ICD-10-CM | POA: Diagnosis not present

## 2017-04-29 DIAGNOSIS — R82998 Other abnormal findings in urine: Secondary | ICD-10-CM | POA: Diagnosis not present

## 2017-04-29 DIAGNOSIS — Z79899 Other long term (current) drug therapy: Secondary | ICD-10-CM | POA: Diagnosis not present

## 2017-04-29 DIAGNOSIS — N2581 Secondary hyperparathyroidism of renal origin: Secondary | ICD-10-CM | POA: Diagnosis not present

## 2017-04-29 DIAGNOSIS — D509 Iron deficiency anemia, unspecified: Secondary | ICD-10-CM | POA: Diagnosis not present

## 2017-04-29 DIAGNOSIS — Z4932 Encounter for adequacy testing for peritoneal dialysis: Secondary | ICD-10-CM | POA: Diagnosis not present

## 2017-04-29 DIAGNOSIS — N186 End stage renal disease: Secondary | ICD-10-CM | POA: Diagnosis not present

## 2017-04-29 DIAGNOSIS — E440 Moderate protein-calorie malnutrition: Secondary | ICD-10-CM | POA: Diagnosis not present

## 2017-04-30 DIAGNOSIS — N2581 Secondary hyperparathyroidism of renal origin: Secondary | ICD-10-CM | POA: Diagnosis not present

## 2017-04-30 DIAGNOSIS — E440 Moderate protein-calorie malnutrition: Secondary | ICD-10-CM | POA: Diagnosis not present

## 2017-04-30 DIAGNOSIS — D630 Anemia in neoplastic disease: Secondary | ICD-10-CM | POA: Diagnosis not present

## 2017-04-30 DIAGNOSIS — N186 End stage renal disease: Secondary | ICD-10-CM | POA: Diagnosis not present

## 2017-04-30 DIAGNOSIS — Z79899 Other long term (current) drug therapy: Secondary | ICD-10-CM | POA: Diagnosis not present

## 2017-04-30 DIAGNOSIS — R82998 Other abnormal findings in urine: Secondary | ICD-10-CM | POA: Diagnosis not present

## 2017-04-30 DIAGNOSIS — Z4932 Encounter for adequacy testing for peritoneal dialysis: Secondary | ICD-10-CM | POA: Diagnosis not present

## 2017-04-30 DIAGNOSIS — D509 Iron deficiency anemia, unspecified: Secondary | ICD-10-CM | POA: Diagnosis not present

## 2017-05-01 DIAGNOSIS — N186 End stage renal disease: Secondary | ICD-10-CM | POA: Diagnosis not present

## 2017-05-01 DIAGNOSIS — N2581 Secondary hyperparathyroidism of renal origin: Secondary | ICD-10-CM | POA: Diagnosis not present

## 2017-05-01 DIAGNOSIS — R82998 Other abnormal findings in urine: Secondary | ICD-10-CM | POA: Diagnosis not present

## 2017-05-01 DIAGNOSIS — E440 Moderate protein-calorie malnutrition: Secondary | ICD-10-CM | POA: Diagnosis not present

## 2017-05-01 DIAGNOSIS — D509 Iron deficiency anemia, unspecified: Secondary | ICD-10-CM | POA: Diagnosis not present

## 2017-05-01 DIAGNOSIS — Z79899 Other long term (current) drug therapy: Secondary | ICD-10-CM | POA: Diagnosis not present

## 2017-05-01 DIAGNOSIS — D630 Anemia in neoplastic disease: Secondary | ICD-10-CM | POA: Diagnosis not present

## 2017-05-01 DIAGNOSIS — Z4932 Encounter for adequacy testing for peritoneal dialysis: Secondary | ICD-10-CM | POA: Diagnosis not present

## 2017-05-02 DIAGNOSIS — E440 Moderate protein-calorie malnutrition: Secondary | ICD-10-CM | POA: Diagnosis not present

## 2017-05-02 DIAGNOSIS — N186 End stage renal disease: Secondary | ICD-10-CM | POA: Diagnosis not present

## 2017-05-02 DIAGNOSIS — N2581 Secondary hyperparathyroidism of renal origin: Secondary | ICD-10-CM | POA: Diagnosis not present

## 2017-05-02 DIAGNOSIS — R82998 Other abnormal findings in urine: Secondary | ICD-10-CM | POA: Diagnosis not present

## 2017-05-02 DIAGNOSIS — D509 Iron deficiency anemia, unspecified: Secondary | ICD-10-CM | POA: Diagnosis not present

## 2017-05-02 DIAGNOSIS — Z79899 Other long term (current) drug therapy: Secondary | ICD-10-CM | POA: Diagnosis not present

## 2017-05-02 DIAGNOSIS — Z4932 Encounter for adequacy testing for peritoneal dialysis: Secondary | ICD-10-CM | POA: Diagnosis not present

## 2017-05-02 DIAGNOSIS — D630 Anemia in neoplastic disease: Secondary | ICD-10-CM | POA: Diagnosis not present

## 2017-05-03 DIAGNOSIS — R82998 Other abnormal findings in urine: Secondary | ICD-10-CM | POA: Diagnosis not present

## 2017-05-03 DIAGNOSIS — D630 Anemia in neoplastic disease: Secondary | ICD-10-CM | POA: Diagnosis not present

## 2017-05-03 DIAGNOSIS — E440 Moderate protein-calorie malnutrition: Secondary | ICD-10-CM | POA: Diagnosis not present

## 2017-05-03 DIAGNOSIS — Z4932 Encounter for adequacy testing for peritoneal dialysis: Secondary | ICD-10-CM | POA: Diagnosis not present

## 2017-05-03 DIAGNOSIS — D509 Iron deficiency anemia, unspecified: Secondary | ICD-10-CM | POA: Diagnosis not present

## 2017-05-03 DIAGNOSIS — N2581 Secondary hyperparathyroidism of renal origin: Secondary | ICD-10-CM | POA: Diagnosis not present

## 2017-05-03 DIAGNOSIS — N186 End stage renal disease: Secondary | ICD-10-CM | POA: Diagnosis not present

## 2017-05-03 DIAGNOSIS — Z79899 Other long term (current) drug therapy: Secondary | ICD-10-CM | POA: Diagnosis not present

## 2017-05-04 DIAGNOSIS — D509 Iron deficiency anemia, unspecified: Secondary | ICD-10-CM | POA: Diagnosis not present

## 2017-05-04 DIAGNOSIS — R82998 Other abnormal findings in urine: Secondary | ICD-10-CM | POA: Diagnosis not present

## 2017-05-04 DIAGNOSIS — Z4932 Encounter for adequacy testing for peritoneal dialysis: Secondary | ICD-10-CM | POA: Diagnosis not present

## 2017-05-04 DIAGNOSIS — N186 End stage renal disease: Secondary | ICD-10-CM | POA: Diagnosis not present

## 2017-05-04 DIAGNOSIS — N2581 Secondary hyperparathyroidism of renal origin: Secondary | ICD-10-CM | POA: Diagnosis not present

## 2017-05-04 DIAGNOSIS — Z79899 Other long term (current) drug therapy: Secondary | ICD-10-CM | POA: Diagnosis not present

## 2017-05-04 DIAGNOSIS — E440 Moderate protein-calorie malnutrition: Secondary | ICD-10-CM | POA: Diagnosis not present

## 2017-05-04 DIAGNOSIS — D630 Anemia in neoplastic disease: Secondary | ICD-10-CM | POA: Diagnosis not present

## 2017-05-05 DIAGNOSIS — D630 Anemia in neoplastic disease: Secondary | ICD-10-CM | POA: Diagnosis not present

## 2017-05-05 DIAGNOSIS — E440 Moderate protein-calorie malnutrition: Secondary | ICD-10-CM | POA: Diagnosis not present

## 2017-05-05 DIAGNOSIS — D509 Iron deficiency anemia, unspecified: Secondary | ICD-10-CM | POA: Diagnosis not present

## 2017-05-05 DIAGNOSIS — N2581 Secondary hyperparathyroidism of renal origin: Secondary | ICD-10-CM | POA: Diagnosis not present

## 2017-05-05 DIAGNOSIS — N186 End stage renal disease: Secondary | ICD-10-CM | POA: Diagnosis not present

## 2017-05-05 DIAGNOSIS — R82998 Other abnormal findings in urine: Secondary | ICD-10-CM | POA: Diagnosis not present

## 2017-05-05 DIAGNOSIS — Z79899 Other long term (current) drug therapy: Secondary | ICD-10-CM | POA: Diagnosis not present

## 2017-05-05 DIAGNOSIS — Z4932 Encounter for adequacy testing for peritoneal dialysis: Secondary | ICD-10-CM | POA: Diagnosis not present

## 2017-05-06 DIAGNOSIS — Z79899 Other long term (current) drug therapy: Secondary | ICD-10-CM | POA: Diagnosis not present

## 2017-05-06 DIAGNOSIS — D509 Iron deficiency anemia, unspecified: Secondary | ICD-10-CM | POA: Diagnosis not present

## 2017-05-06 DIAGNOSIS — N186 End stage renal disease: Secondary | ICD-10-CM | POA: Diagnosis not present

## 2017-05-06 DIAGNOSIS — R82998 Other abnormal findings in urine: Secondary | ICD-10-CM | POA: Diagnosis not present

## 2017-05-06 DIAGNOSIS — D630 Anemia in neoplastic disease: Secondary | ICD-10-CM | POA: Diagnosis not present

## 2017-05-06 DIAGNOSIS — Z4932 Encounter for adequacy testing for peritoneal dialysis: Secondary | ICD-10-CM | POA: Diagnosis not present

## 2017-05-06 DIAGNOSIS — E440 Moderate protein-calorie malnutrition: Secondary | ICD-10-CM | POA: Diagnosis not present

## 2017-05-06 DIAGNOSIS — N2581 Secondary hyperparathyroidism of renal origin: Secondary | ICD-10-CM | POA: Diagnosis not present

## 2017-05-07 DIAGNOSIS — Z79899 Other long term (current) drug therapy: Secondary | ICD-10-CM | POA: Diagnosis not present

## 2017-05-07 DIAGNOSIS — D509 Iron deficiency anemia, unspecified: Secondary | ICD-10-CM | POA: Diagnosis not present

## 2017-05-07 DIAGNOSIS — D630 Anemia in neoplastic disease: Secondary | ICD-10-CM | POA: Diagnosis not present

## 2017-05-07 DIAGNOSIS — E440 Moderate protein-calorie malnutrition: Secondary | ICD-10-CM | POA: Diagnosis not present

## 2017-05-07 DIAGNOSIS — R82998 Other abnormal findings in urine: Secondary | ICD-10-CM | POA: Diagnosis not present

## 2017-05-07 DIAGNOSIS — N186 End stage renal disease: Secondary | ICD-10-CM | POA: Diagnosis not present

## 2017-05-07 DIAGNOSIS — Z4932 Encounter for adequacy testing for peritoneal dialysis: Secondary | ICD-10-CM | POA: Diagnosis not present

## 2017-05-07 DIAGNOSIS — N2581 Secondary hyperparathyroidism of renal origin: Secondary | ICD-10-CM | POA: Diagnosis not present

## 2017-05-08 DIAGNOSIS — R82998 Other abnormal findings in urine: Secondary | ICD-10-CM | POA: Diagnosis not present

## 2017-05-08 DIAGNOSIS — Z79899 Other long term (current) drug therapy: Secondary | ICD-10-CM | POA: Diagnosis not present

## 2017-05-08 DIAGNOSIS — Z992 Dependence on renal dialysis: Secondary | ICD-10-CM | POA: Diagnosis not present

## 2017-05-08 DIAGNOSIS — N186 End stage renal disease: Secondary | ICD-10-CM | POA: Diagnosis not present

## 2017-05-08 DIAGNOSIS — D630 Anemia in neoplastic disease: Secondary | ICD-10-CM | POA: Diagnosis not present

## 2017-05-08 DIAGNOSIS — I150 Renovascular hypertension: Secondary | ICD-10-CM | POA: Diagnosis not present

## 2017-05-08 DIAGNOSIS — N2581 Secondary hyperparathyroidism of renal origin: Secondary | ICD-10-CM | POA: Diagnosis not present

## 2017-05-08 DIAGNOSIS — Z4932 Encounter for adequacy testing for peritoneal dialysis: Secondary | ICD-10-CM | POA: Diagnosis not present

## 2017-05-08 DIAGNOSIS — D509 Iron deficiency anemia, unspecified: Secondary | ICD-10-CM | POA: Diagnosis not present

## 2017-05-08 DIAGNOSIS — E440 Moderate protein-calorie malnutrition: Secondary | ICD-10-CM | POA: Diagnosis not present

## 2017-05-09 DIAGNOSIS — N2581 Secondary hyperparathyroidism of renal origin: Secondary | ICD-10-CM | POA: Diagnosis not present

## 2017-05-09 DIAGNOSIS — N186 End stage renal disease: Secondary | ICD-10-CM | POA: Diagnosis not present

## 2017-05-10 DIAGNOSIS — N2581 Secondary hyperparathyroidism of renal origin: Secondary | ICD-10-CM | POA: Diagnosis not present

## 2017-05-10 DIAGNOSIS — N186 End stage renal disease: Secondary | ICD-10-CM | POA: Diagnosis not present

## 2017-05-11 DIAGNOSIS — N2581 Secondary hyperparathyroidism of renal origin: Secondary | ICD-10-CM | POA: Diagnosis not present

## 2017-05-11 DIAGNOSIS — N186 End stage renal disease: Secondary | ICD-10-CM | POA: Diagnosis not present

## 2017-05-12 DIAGNOSIS — N2581 Secondary hyperparathyroidism of renal origin: Secondary | ICD-10-CM | POA: Diagnosis not present

## 2017-05-12 DIAGNOSIS — N186 End stage renal disease: Secondary | ICD-10-CM | POA: Diagnosis not present

## 2017-05-13 DIAGNOSIS — N186 End stage renal disease: Secondary | ICD-10-CM | POA: Diagnosis not present

## 2017-05-13 DIAGNOSIS — N2581 Secondary hyperparathyroidism of renal origin: Secondary | ICD-10-CM | POA: Diagnosis not present

## 2017-05-14 DIAGNOSIS — N2581 Secondary hyperparathyroidism of renal origin: Secondary | ICD-10-CM | POA: Diagnosis not present

## 2017-05-14 DIAGNOSIS — N186 End stage renal disease: Secondary | ICD-10-CM | POA: Diagnosis not present

## 2017-05-15 DIAGNOSIS — N186 End stage renal disease: Secondary | ICD-10-CM | POA: Diagnosis not present

## 2017-05-15 DIAGNOSIS — N2581 Secondary hyperparathyroidism of renal origin: Secondary | ICD-10-CM | POA: Diagnosis not present

## 2017-05-16 DIAGNOSIS — N2581 Secondary hyperparathyroidism of renal origin: Secondary | ICD-10-CM | POA: Diagnosis not present

## 2017-05-16 DIAGNOSIS — N186 End stage renal disease: Secondary | ICD-10-CM | POA: Diagnosis not present

## 2017-05-17 DIAGNOSIS — N186 End stage renal disease: Secondary | ICD-10-CM | POA: Diagnosis not present

## 2017-05-17 DIAGNOSIS — N2581 Secondary hyperparathyroidism of renal origin: Secondary | ICD-10-CM | POA: Diagnosis not present

## 2017-05-18 DIAGNOSIS — N2581 Secondary hyperparathyroidism of renal origin: Secondary | ICD-10-CM | POA: Diagnosis not present

## 2017-05-18 DIAGNOSIS — N186 End stage renal disease: Secondary | ICD-10-CM | POA: Diagnosis not present

## 2017-05-19 DIAGNOSIS — N2581 Secondary hyperparathyroidism of renal origin: Secondary | ICD-10-CM | POA: Diagnosis not present

## 2017-05-19 DIAGNOSIS — N186 End stage renal disease: Secondary | ICD-10-CM | POA: Diagnosis not present

## 2017-05-20 DIAGNOSIS — N186 End stage renal disease: Secondary | ICD-10-CM | POA: Diagnosis not present

## 2017-05-20 DIAGNOSIS — N2581 Secondary hyperparathyroidism of renal origin: Secondary | ICD-10-CM | POA: Diagnosis not present

## 2017-05-21 DIAGNOSIS — N186 End stage renal disease: Secondary | ICD-10-CM | POA: Diagnosis not present

## 2017-05-21 DIAGNOSIS — N2581 Secondary hyperparathyroidism of renal origin: Secondary | ICD-10-CM | POA: Diagnosis not present

## 2017-05-22 DIAGNOSIS — N186 End stage renal disease: Secondary | ICD-10-CM | POA: Diagnosis not present

## 2017-05-22 DIAGNOSIS — N2581 Secondary hyperparathyroidism of renal origin: Secondary | ICD-10-CM | POA: Diagnosis not present

## 2017-05-23 DIAGNOSIS — N2581 Secondary hyperparathyroidism of renal origin: Secondary | ICD-10-CM | POA: Diagnosis not present

## 2017-05-23 DIAGNOSIS — N186 End stage renal disease: Secondary | ICD-10-CM | POA: Diagnosis not present

## 2017-05-24 DIAGNOSIS — N2581 Secondary hyperparathyroidism of renal origin: Secondary | ICD-10-CM | POA: Diagnosis not present

## 2017-05-24 DIAGNOSIS — N186 End stage renal disease: Secondary | ICD-10-CM | POA: Diagnosis not present

## 2017-05-25 DIAGNOSIS — N2581 Secondary hyperparathyroidism of renal origin: Secondary | ICD-10-CM | POA: Diagnosis not present

## 2017-05-25 DIAGNOSIS — N186 End stage renal disease: Secondary | ICD-10-CM | POA: Diagnosis not present

## 2017-05-26 DIAGNOSIS — N2581 Secondary hyperparathyroidism of renal origin: Secondary | ICD-10-CM | POA: Diagnosis not present

## 2017-05-26 DIAGNOSIS — N186 End stage renal disease: Secondary | ICD-10-CM | POA: Diagnosis not present

## 2017-05-27 DIAGNOSIS — N186 End stage renal disease: Secondary | ICD-10-CM | POA: Diagnosis not present

## 2017-05-27 DIAGNOSIS — N2581 Secondary hyperparathyroidism of renal origin: Secondary | ICD-10-CM | POA: Diagnosis not present

## 2017-05-28 DIAGNOSIS — N186 End stage renal disease: Secondary | ICD-10-CM | POA: Diagnosis not present

## 2017-05-28 DIAGNOSIS — N2581 Secondary hyperparathyroidism of renal origin: Secondary | ICD-10-CM | POA: Diagnosis not present

## 2017-05-29 DIAGNOSIS — N186 End stage renal disease: Secondary | ICD-10-CM | POA: Diagnosis not present

## 2017-05-29 DIAGNOSIS — N2581 Secondary hyperparathyroidism of renal origin: Secondary | ICD-10-CM | POA: Diagnosis not present

## 2017-05-29 DIAGNOSIS — R6889 Other general symptoms and signs: Secondary | ICD-10-CM | POA: Diagnosis not present

## 2017-05-30 DIAGNOSIS — N186 End stage renal disease: Secondary | ICD-10-CM | POA: Diagnosis not present

## 2017-05-30 DIAGNOSIS — N2581 Secondary hyperparathyroidism of renal origin: Secondary | ICD-10-CM | POA: Diagnosis not present

## 2017-05-31 DIAGNOSIS — N2581 Secondary hyperparathyroidism of renal origin: Secondary | ICD-10-CM | POA: Diagnosis not present

## 2017-05-31 DIAGNOSIS — N186 End stage renal disease: Secondary | ICD-10-CM | POA: Diagnosis not present

## 2017-06-01 DIAGNOSIS — N186 End stage renal disease: Secondary | ICD-10-CM | POA: Diagnosis not present

## 2017-06-01 DIAGNOSIS — N2581 Secondary hyperparathyroidism of renal origin: Secondary | ICD-10-CM | POA: Diagnosis not present

## 2017-06-02 DIAGNOSIS — N186 End stage renal disease: Secondary | ICD-10-CM | POA: Diagnosis not present

## 2017-06-02 DIAGNOSIS — N2581 Secondary hyperparathyroidism of renal origin: Secondary | ICD-10-CM | POA: Diagnosis not present

## 2017-06-03 DIAGNOSIS — N186 End stage renal disease: Secondary | ICD-10-CM | POA: Diagnosis not present

## 2017-06-03 DIAGNOSIS — N2581 Secondary hyperparathyroidism of renal origin: Secondary | ICD-10-CM | POA: Diagnosis not present

## 2017-06-04 DIAGNOSIS — N2581 Secondary hyperparathyroidism of renal origin: Secondary | ICD-10-CM | POA: Diagnosis not present

## 2017-06-04 DIAGNOSIS — N186 End stage renal disease: Secondary | ICD-10-CM | POA: Diagnosis not present

## 2017-06-05 DIAGNOSIS — N186 End stage renal disease: Secondary | ICD-10-CM | POA: Diagnosis not present

## 2017-06-05 DIAGNOSIS — N2581 Secondary hyperparathyroidism of renal origin: Secondary | ICD-10-CM | POA: Diagnosis not present

## 2017-06-06 DIAGNOSIS — N186 End stage renal disease: Secondary | ICD-10-CM | POA: Diagnosis not present

## 2017-06-06 DIAGNOSIS — N2581 Secondary hyperparathyroidism of renal origin: Secondary | ICD-10-CM | POA: Diagnosis not present

## 2017-06-07 DIAGNOSIS — N186 End stage renal disease: Secondary | ICD-10-CM | POA: Diagnosis not present

## 2017-06-07 DIAGNOSIS — N2581 Secondary hyperparathyroidism of renal origin: Secondary | ICD-10-CM | POA: Diagnosis not present

## 2017-06-08 DIAGNOSIS — N2581 Secondary hyperparathyroidism of renal origin: Secondary | ICD-10-CM | POA: Diagnosis not present

## 2017-06-08 DIAGNOSIS — Z992 Dependence on renal dialysis: Secondary | ICD-10-CM | POA: Diagnosis not present

## 2017-06-08 DIAGNOSIS — N186 End stage renal disease: Secondary | ICD-10-CM | POA: Diagnosis not present

## 2017-06-08 DIAGNOSIS — I150 Renovascular hypertension: Secondary | ICD-10-CM | POA: Diagnosis not present

## 2017-06-09 DIAGNOSIS — N186 End stage renal disease: Secondary | ICD-10-CM | POA: Diagnosis not present

## 2017-06-09 DIAGNOSIS — I150 Renovascular hypertension: Secondary | ICD-10-CM | POA: Diagnosis not present

## 2017-06-09 DIAGNOSIS — N2581 Secondary hyperparathyroidism of renal origin: Secondary | ICD-10-CM | POA: Diagnosis not present

## 2017-06-09 DIAGNOSIS — Z992 Dependence on renal dialysis: Secondary | ICD-10-CM | POA: Diagnosis not present

## 2017-06-10 DIAGNOSIS — N186 End stage renal disease: Secondary | ICD-10-CM | POA: Diagnosis not present

## 2017-06-10 DIAGNOSIS — N2581 Secondary hyperparathyroidism of renal origin: Secondary | ICD-10-CM | POA: Diagnosis not present

## 2017-06-11 DIAGNOSIS — N2581 Secondary hyperparathyroidism of renal origin: Secondary | ICD-10-CM | POA: Diagnosis not present

## 2017-06-11 DIAGNOSIS — N186 End stage renal disease: Secondary | ICD-10-CM | POA: Diagnosis not present

## 2017-06-12 DIAGNOSIS — N186 End stage renal disease: Secondary | ICD-10-CM | POA: Diagnosis not present

## 2017-06-12 DIAGNOSIS — N2581 Secondary hyperparathyroidism of renal origin: Secondary | ICD-10-CM | POA: Diagnosis not present

## 2017-06-13 DIAGNOSIS — N2581 Secondary hyperparathyroidism of renal origin: Secondary | ICD-10-CM | POA: Diagnosis not present

## 2017-06-13 DIAGNOSIS — N186 End stage renal disease: Secondary | ICD-10-CM | POA: Diagnosis not present

## 2017-06-14 DIAGNOSIS — N186 End stage renal disease: Secondary | ICD-10-CM | POA: Diagnosis not present

## 2017-06-14 DIAGNOSIS — N2581 Secondary hyperparathyroidism of renal origin: Secondary | ICD-10-CM | POA: Diagnosis not present

## 2017-06-15 DIAGNOSIS — N2581 Secondary hyperparathyroidism of renal origin: Secondary | ICD-10-CM | POA: Diagnosis not present

## 2017-06-15 DIAGNOSIS — N186 End stage renal disease: Secondary | ICD-10-CM | POA: Diagnosis not present

## 2017-06-16 DIAGNOSIS — N2581 Secondary hyperparathyroidism of renal origin: Secondary | ICD-10-CM | POA: Diagnosis not present

## 2017-06-16 DIAGNOSIS — N186 End stage renal disease: Secondary | ICD-10-CM | POA: Diagnosis not present

## 2017-06-17 DIAGNOSIS — N2581 Secondary hyperparathyroidism of renal origin: Secondary | ICD-10-CM | POA: Diagnosis not present

## 2017-06-17 DIAGNOSIS — N186 End stage renal disease: Secondary | ICD-10-CM | POA: Diagnosis not present

## 2017-06-18 DIAGNOSIS — N186 End stage renal disease: Secondary | ICD-10-CM | POA: Diagnosis not present

## 2017-06-18 DIAGNOSIS — N2581 Secondary hyperparathyroidism of renal origin: Secondary | ICD-10-CM | POA: Diagnosis not present

## 2017-06-19 DIAGNOSIS — N186 End stage renal disease: Secondary | ICD-10-CM | POA: Diagnosis not present

## 2017-06-19 DIAGNOSIS — N2581 Secondary hyperparathyroidism of renal origin: Secondary | ICD-10-CM | POA: Diagnosis not present

## 2017-06-20 DIAGNOSIS — N186 End stage renal disease: Secondary | ICD-10-CM | POA: Diagnosis not present

## 2017-06-20 DIAGNOSIS — N2581 Secondary hyperparathyroidism of renal origin: Secondary | ICD-10-CM | POA: Diagnosis not present

## 2017-06-21 DIAGNOSIS — N186 End stage renal disease: Secondary | ICD-10-CM | POA: Diagnosis not present

## 2017-06-21 DIAGNOSIS — N2581 Secondary hyperparathyroidism of renal origin: Secondary | ICD-10-CM | POA: Diagnosis not present

## 2017-06-22 DIAGNOSIS — N186 End stage renal disease: Secondary | ICD-10-CM | POA: Diagnosis not present

## 2017-06-22 DIAGNOSIS — N2581 Secondary hyperparathyroidism of renal origin: Secondary | ICD-10-CM | POA: Diagnosis not present

## 2017-06-23 DIAGNOSIS — N2581 Secondary hyperparathyroidism of renal origin: Secondary | ICD-10-CM | POA: Diagnosis not present

## 2017-06-23 DIAGNOSIS — N186 End stage renal disease: Secondary | ICD-10-CM | POA: Diagnosis not present

## 2017-06-24 DIAGNOSIS — N186 End stage renal disease: Secondary | ICD-10-CM | POA: Diagnosis not present

## 2017-06-24 DIAGNOSIS — N2581 Secondary hyperparathyroidism of renal origin: Secondary | ICD-10-CM | POA: Diagnosis not present

## 2017-06-25 DIAGNOSIS — N2581 Secondary hyperparathyroidism of renal origin: Secondary | ICD-10-CM | POA: Diagnosis not present

## 2017-06-25 DIAGNOSIS — N186 End stage renal disease: Secondary | ICD-10-CM | POA: Diagnosis not present

## 2017-06-26 DIAGNOSIS — N186 End stage renal disease: Secondary | ICD-10-CM | POA: Diagnosis not present

## 2017-06-26 DIAGNOSIS — N2581 Secondary hyperparathyroidism of renal origin: Secondary | ICD-10-CM | POA: Diagnosis not present

## 2017-06-27 DIAGNOSIS — N186 End stage renal disease: Secondary | ICD-10-CM | POA: Diagnosis not present

## 2017-06-27 DIAGNOSIS — N2581 Secondary hyperparathyroidism of renal origin: Secondary | ICD-10-CM | POA: Diagnosis not present

## 2017-06-28 DIAGNOSIS — N2581 Secondary hyperparathyroidism of renal origin: Secondary | ICD-10-CM | POA: Diagnosis not present

## 2017-06-28 DIAGNOSIS — N186 End stage renal disease: Secondary | ICD-10-CM | POA: Diagnosis not present

## 2017-06-29 DIAGNOSIS — N2581 Secondary hyperparathyroidism of renal origin: Secondary | ICD-10-CM | POA: Diagnosis not present

## 2017-06-29 DIAGNOSIS — N186 End stage renal disease: Secondary | ICD-10-CM | POA: Diagnosis not present

## 2017-06-30 DIAGNOSIS — N2581 Secondary hyperparathyroidism of renal origin: Secondary | ICD-10-CM | POA: Diagnosis not present

## 2017-06-30 DIAGNOSIS — N186 End stage renal disease: Secondary | ICD-10-CM | POA: Diagnosis not present

## 2017-07-01 DIAGNOSIS — N186 End stage renal disease: Secondary | ICD-10-CM | POA: Diagnosis not present

## 2017-07-01 DIAGNOSIS — N2581 Secondary hyperparathyroidism of renal origin: Secondary | ICD-10-CM | POA: Diagnosis not present

## 2017-07-02 DIAGNOSIS — N2581 Secondary hyperparathyroidism of renal origin: Secondary | ICD-10-CM | POA: Diagnosis not present

## 2017-07-02 DIAGNOSIS — N186 End stage renal disease: Secondary | ICD-10-CM | POA: Diagnosis not present

## 2017-07-03 DIAGNOSIS — N186 End stage renal disease: Secondary | ICD-10-CM | POA: Diagnosis not present

## 2017-07-03 DIAGNOSIS — N2581 Secondary hyperparathyroidism of renal origin: Secondary | ICD-10-CM | POA: Diagnosis not present

## 2017-07-04 DIAGNOSIS — N2581 Secondary hyperparathyroidism of renal origin: Secondary | ICD-10-CM | POA: Diagnosis not present

## 2017-07-04 DIAGNOSIS — N186 End stage renal disease: Secondary | ICD-10-CM | POA: Diagnosis not present

## 2017-07-05 DIAGNOSIS — N186 End stage renal disease: Secondary | ICD-10-CM | POA: Diagnosis not present

## 2017-07-05 DIAGNOSIS — N2581 Secondary hyperparathyroidism of renal origin: Secondary | ICD-10-CM | POA: Diagnosis not present

## 2017-07-06 DIAGNOSIS — N2581 Secondary hyperparathyroidism of renal origin: Secondary | ICD-10-CM | POA: Diagnosis not present

## 2017-07-06 DIAGNOSIS — N186 End stage renal disease: Secondary | ICD-10-CM | POA: Diagnosis not present

## 2017-07-07 DIAGNOSIS — Z992 Dependence on renal dialysis: Secondary | ICD-10-CM | POA: Diagnosis not present

## 2017-07-07 DIAGNOSIS — N2581 Secondary hyperparathyroidism of renal origin: Secondary | ICD-10-CM | POA: Diagnosis not present

## 2017-07-07 DIAGNOSIS — I150 Renovascular hypertension: Secondary | ICD-10-CM | POA: Diagnosis not present

## 2017-07-07 DIAGNOSIS — N186 End stage renal disease: Secondary | ICD-10-CM | POA: Diagnosis not present

## 2017-07-08 DIAGNOSIS — N2581 Secondary hyperparathyroidism of renal origin: Secondary | ICD-10-CM | POA: Diagnosis not present

## 2017-07-08 DIAGNOSIS — N186 End stage renal disease: Secondary | ICD-10-CM | POA: Diagnosis not present

## 2017-07-09 DIAGNOSIS — N186 End stage renal disease: Secondary | ICD-10-CM | POA: Diagnosis not present

## 2017-07-09 DIAGNOSIS — N2581 Secondary hyperparathyroidism of renal origin: Secondary | ICD-10-CM | POA: Diagnosis not present

## 2017-07-10 DIAGNOSIS — N2581 Secondary hyperparathyroidism of renal origin: Secondary | ICD-10-CM | POA: Diagnosis not present

## 2017-07-10 DIAGNOSIS — N186 End stage renal disease: Secondary | ICD-10-CM | POA: Diagnosis not present

## 2017-07-11 DIAGNOSIS — N2581 Secondary hyperparathyroidism of renal origin: Secondary | ICD-10-CM | POA: Diagnosis not present

## 2017-07-11 DIAGNOSIS — N186 End stage renal disease: Secondary | ICD-10-CM | POA: Diagnosis not present

## 2017-07-12 DIAGNOSIS — N186 End stage renal disease: Secondary | ICD-10-CM | POA: Diagnosis not present

## 2017-07-12 DIAGNOSIS — N2581 Secondary hyperparathyroidism of renal origin: Secondary | ICD-10-CM | POA: Diagnosis not present

## 2017-07-13 DIAGNOSIS — N186 End stage renal disease: Secondary | ICD-10-CM | POA: Diagnosis not present

## 2017-07-13 DIAGNOSIS — N2581 Secondary hyperparathyroidism of renal origin: Secondary | ICD-10-CM | POA: Diagnosis not present

## 2017-07-14 DIAGNOSIS — N2581 Secondary hyperparathyroidism of renal origin: Secondary | ICD-10-CM | POA: Diagnosis not present

## 2017-07-14 DIAGNOSIS — N186 End stage renal disease: Secondary | ICD-10-CM | POA: Diagnosis not present

## 2017-07-15 DIAGNOSIS — N2581 Secondary hyperparathyroidism of renal origin: Secondary | ICD-10-CM | POA: Diagnosis not present

## 2017-07-15 DIAGNOSIS — N186 End stage renal disease: Secondary | ICD-10-CM | POA: Diagnosis not present

## 2017-07-16 DIAGNOSIS — N2581 Secondary hyperparathyroidism of renal origin: Secondary | ICD-10-CM | POA: Diagnosis not present

## 2017-07-16 DIAGNOSIS — N186 End stage renal disease: Secondary | ICD-10-CM | POA: Diagnosis not present

## 2017-07-17 DIAGNOSIS — N186 End stage renal disease: Secondary | ICD-10-CM | POA: Diagnosis not present

## 2017-07-17 DIAGNOSIS — N2581 Secondary hyperparathyroidism of renal origin: Secondary | ICD-10-CM | POA: Diagnosis not present

## 2017-07-18 DIAGNOSIS — N2581 Secondary hyperparathyroidism of renal origin: Secondary | ICD-10-CM | POA: Diagnosis not present

## 2017-07-18 DIAGNOSIS — N186 End stage renal disease: Secondary | ICD-10-CM | POA: Diagnosis not present

## 2017-07-19 DIAGNOSIS — N2581 Secondary hyperparathyroidism of renal origin: Secondary | ICD-10-CM | POA: Diagnosis not present

## 2017-07-19 DIAGNOSIS — N186 End stage renal disease: Secondary | ICD-10-CM | POA: Diagnosis not present

## 2017-07-19 DIAGNOSIS — I10 Essential (primary) hypertension: Secondary | ICD-10-CM | POA: Diagnosis not present

## 2017-07-19 DIAGNOSIS — N289 Disorder of kidney and ureter, unspecified: Secondary | ICD-10-CM | POA: Diagnosis not present

## 2017-07-19 DIAGNOSIS — E782 Mixed hyperlipidemia: Secondary | ICD-10-CM | POA: Diagnosis not present

## 2017-07-20 DIAGNOSIS — N186 End stage renal disease: Secondary | ICD-10-CM | POA: Diagnosis not present

## 2017-07-20 DIAGNOSIS — N2581 Secondary hyperparathyroidism of renal origin: Secondary | ICD-10-CM | POA: Diagnosis not present

## 2017-07-21 DIAGNOSIS — N2581 Secondary hyperparathyroidism of renal origin: Secondary | ICD-10-CM | POA: Diagnosis not present

## 2017-07-21 DIAGNOSIS — N186 End stage renal disease: Secondary | ICD-10-CM | POA: Diagnosis not present

## 2017-07-22 DIAGNOSIS — N186 End stage renal disease: Secondary | ICD-10-CM | POA: Diagnosis not present

## 2017-07-22 DIAGNOSIS — N2581 Secondary hyperparathyroidism of renal origin: Secondary | ICD-10-CM | POA: Diagnosis not present

## 2017-07-23 DIAGNOSIS — N186 End stage renal disease: Secondary | ICD-10-CM | POA: Diagnosis not present

## 2017-07-23 DIAGNOSIS — N2581 Secondary hyperparathyroidism of renal origin: Secondary | ICD-10-CM | POA: Diagnosis not present

## 2017-07-24 DIAGNOSIS — N186 End stage renal disease: Secondary | ICD-10-CM | POA: Diagnosis not present

## 2017-07-24 DIAGNOSIS — N2581 Secondary hyperparathyroidism of renal origin: Secondary | ICD-10-CM | POA: Diagnosis not present

## 2017-07-25 DIAGNOSIS — N2581 Secondary hyperparathyroidism of renal origin: Secondary | ICD-10-CM | POA: Diagnosis not present

## 2017-07-25 DIAGNOSIS — N186 End stage renal disease: Secondary | ICD-10-CM | POA: Diagnosis not present

## 2017-07-26 DIAGNOSIS — J449 Chronic obstructive pulmonary disease, unspecified: Secondary | ICD-10-CM | POA: Diagnosis not present

## 2017-07-26 DIAGNOSIS — N186 End stage renal disease: Secondary | ICD-10-CM | POA: Diagnosis not present

## 2017-07-26 DIAGNOSIS — E782 Mixed hyperlipidemia: Secondary | ICD-10-CM | POA: Diagnosis not present

## 2017-07-26 DIAGNOSIS — N019 Rapidly progressive nephritic syndrome with unspecified morphologic changes: Secondary | ICD-10-CM | POA: Diagnosis not present

## 2017-07-26 DIAGNOSIS — N2581 Secondary hyperparathyroidism of renal origin: Secondary | ICD-10-CM | POA: Diagnosis not present

## 2017-07-27 DIAGNOSIS — N186 End stage renal disease: Secondary | ICD-10-CM | POA: Diagnosis not present

## 2017-07-27 DIAGNOSIS — N2581 Secondary hyperparathyroidism of renal origin: Secondary | ICD-10-CM | POA: Diagnosis not present

## 2017-07-27 DIAGNOSIS — R6889 Other general symptoms and signs: Secondary | ICD-10-CM | POA: Diagnosis not present

## 2017-07-28 DIAGNOSIS — N186 End stage renal disease: Secondary | ICD-10-CM | POA: Diagnosis not present

## 2017-07-28 DIAGNOSIS — N2581 Secondary hyperparathyroidism of renal origin: Secondary | ICD-10-CM | POA: Diagnosis not present

## 2017-07-29 DIAGNOSIS — N186 End stage renal disease: Secondary | ICD-10-CM | POA: Diagnosis not present

## 2017-07-29 DIAGNOSIS — N2581 Secondary hyperparathyroidism of renal origin: Secondary | ICD-10-CM | POA: Diagnosis not present

## 2017-07-30 DIAGNOSIS — N186 End stage renal disease: Secondary | ICD-10-CM | POA: Diagnosis not present

## 2017-07-30 DIAGNOSIS — N2581 Secondary hyperparathyroidism of renal origin: Secondary | ICD-10-CM | POA: Diagnosis not present

## 2017-07-31 DIAGNOSIS — N186 End stage renal disease: Secondary | ICD-10-CM | POA: Diagnosis not present

## 2017-07-31 DIAGNOSIS — N2581 Secondary hyperparathyroidism of renal origin: Secondary | ICD-10-CM | POA: Diagnosis not present

## 2017-07-31 DIAGNOSIS — R6889 Other general symptoms and signs: Secondary | ICD-10-CM | POA: Diagnosis not present

## 2017-08-01 DIAGNOSIS — N2581 Secondary hyperparathyroidism of renal origin: Secondary | ICD-10-CM | POA: Diagnosis not present

## 2017-08-01 DIAGNOSIS — N186 End stage renal disease: Secondary | ICD-10-CM | POA: Diagnosis not present

## 2017-08-02 DIAGNOSIS — N186 End stage renal disease: Secondary | ICD-10-CM | POA: Diagnosis not present

## 2017-08-02 DIAGNOSIS — N2581 Secondary hyperparathyroidism of renal origin: Secondary | ICD-10-CM | POA: Diagnosis not present

## 2017-08-03 DIAGNOSIS — N186 End stage renal disease: Secondary | ICD-10-CM | POA: Diagnosis not present

## 2017-08-03 DIAGNOSIS — N2581 Secondary hyperparathyroidism of renal origin: Secondary | ICD-10-CM | POA: Diagnosis not present

## 2017-08-04 DIAGNOSIS — N2581 Secondary hyperparathyroidism of renal origin: Secondary | ICD-10-CM | POA: Diagnosis not present

## 2017-08-04 DIAGNOSIS — N186 End stage renal disease: Secondary | ICD-10-CM | POA: Diagnosis not present

## 2017-08-05 DIAGNOSIS — N186 End stage renal disease: Secondary | ICD-10-CM | POA: Diagnosis not present

## 2017-08-05 DIAGNOSIS — N2581 Secondary hyperparathyroidism of renal origin: Secondary | ICD-10-CM | POA: Diagnosis not present

## 2017-08-06 DIAGNOSIS — N186 End stage renal disease: Secondary | ICD-10-CM | POA: Diagnosis not present

## 2017-08-06 DIAGNOSIS — N2581 Secondary hyperparathyroidism of renal origin: Secondary | ICD-10-CM | POA: Diagnosis not present

## 2017-08-07 DIAGNOSIS — N186 End stage renal disease: Secondary | ICD-10-CM | POA: Diagnosis not present

## 2017-08-07 DIAGNOSIS — N2581 Secondary hyperparathyroidism of renal origin: Secondary | ICD-10-CM | POA: Diagnosis not present

## 2017-08-08 DIAGNOSIS — N2581 Secondary hyperparathyroidism of renal origin: Secondary | ICD-10-CM | POA: Diagnosis not present

## 2017-08-08 DIAGNOSIS — N186 End stage renal disease: Secondary | ICD-10-CM | POA: Diagnosis not present

## 2017-08-09 DIAGNOSIS — N186 End stage renal disease: Secondary | ICD-10-CM | POA: Diagnosis not present

## 2017-08-09 DIAGNOSIS — N2581 Secondary hyperparathyroidism of renal origin: Secondary | ICD-10-CM | POA: Diagnosis not present

## 2017-08-10 DIAGNOSIS — N2581 Secondary hyperparathyroidism of renal origin: Secondary | ICD-10-CM | POA: Diagnosis not present

## 2017-08-10 DIAGNOSIS — N186 End stage renal disease: Secondary | ICD-10-CM | POA: Diagnosis not present

## 2017-08-11 DIAGNOSIS — N2581 Secondary hyperparathyroidism of renal origin: Secondary | ICD-10-CM | POA: Diagnosis not present

## 2017-08-11 DIAGNOSIS — N186 End stage renal disease: Secondary | ICD-10-CM | POA: Diagnosis not present

## 2017-08-12 DIAGNOSIS — N2581 Secondary hyperparathyroidism of renal origin: Secondary | ICD-10-CM | POA: Diagnosis not present

## 2017-08-12 DIAGNOSIS — N186 End stage renal disease: Secondary | ICD-10-CM | POA: Diagnosis not present

## 2017-08-13 DIAGNOSIS — N2581 Secondary hyperparathyroidism of renal origin: Secondary | ICD-10-CM | POA: Diagnosis not present

## 2017-08-13 DIAGNOSIS — N186 End stage renal disease: Secondary | ICD-10-CM | POA: Diagnosis not present

## 2017-08-14 DIAGNOSIS — N2581 Secondary hyperparathyroidism of renal origin: Secondary | ICD-10-CM | POA: Diagnosis not present

## 2017-08-14 DIAGNOSIS — N186 End stage renal disease: Secondary | ICD-10-CM | POA: Diagnosis not present

## 2017-08-15 DIAGNOSIS — N186 End stage renal disease: Secondary | ICD-10-CM | POA: Diagnosis not present

## 2017-08-15 DIAGNOSIS — N2581 Secondary hyperparathyroidism of renal origin: Secondary | ICD-10-CM | POA: Diagnosis not present

## 2017-08-16 DIAGNOSIS — N2581 Secondary hyperparathyroidism of renal origin: Secondary | ICD-10-CM | POA: Diagnosis not present

## 2017-08-16 DIAGNOSIS — N186 End stage renal disease: Secondary | ICD-10-CM | POA: Diagnosis not present

## 2017-08-17 DIAGNOSIS — N186 End stage renal disease: Secondary | ICD-10-CM | POA: Diagnosis not present

## 2017-08-17 DIAGNOSIS — N2581 Secondary hyperparathyroidism of renal origin: Secondary | ICD-10-CM | POA: Diagnosis not present

## 2017-08-18 DIAGNOSIS — N186 End stage renal disease: Secondary | ICD-10-CM | POA: Diagnosis not present

## 2017-08-18 DIAGNOSIS — N2581 Secondary hyperparathyroidism of renal origin: Secondary | ICD-10-CM | POA: Diagnosis not present

## 2017-08-19 DIAGNOSIS — N186 End stage renal disease: Secondary | ICD-10-CM | POA: Diagnosis not present

## 2017-08-19 DIAGNOSIS — N2581 Secondary hyperparathyroidism of renal origin: Secondary | ICD-10-CM | POA: Diagnosis not present

## 2017-08-20 DIAGNOSIS — N186 End stage renal disease: Secondary | ICD-10-CM | POA: Diagnosis not present

## 2017-08-20 DIAGNOSIS — N2581 Secondary hyperparathyroidism of renal origin: Secondary | ICD-10-CM | POA: Diagnosis not present

## 2017-08-21 DIAGNOSIS — N186 End stage renal disease: Secondary | ICD-10-CM | POA: Diagnosis not present

## 2017-08-21 DIAGNOSIS — N2581 Secondary hyperparathyroidism of renal origin: Secondary | ICD-10-CM | POA: Diagnosis not present

## 2017-08-21 IMAGING — US IR US GUIDE VASC ACCESS RIGHT
1 series · 1 of 1 positions shown · non-contrast
Comparison: none

INDICATION: End-stage renal disease. In need of durable intravenous access for
the initiation of dialysis.

[Series 1: ir rad eval and mgt. · 1 of 1 slices shown]
[im 1/1]
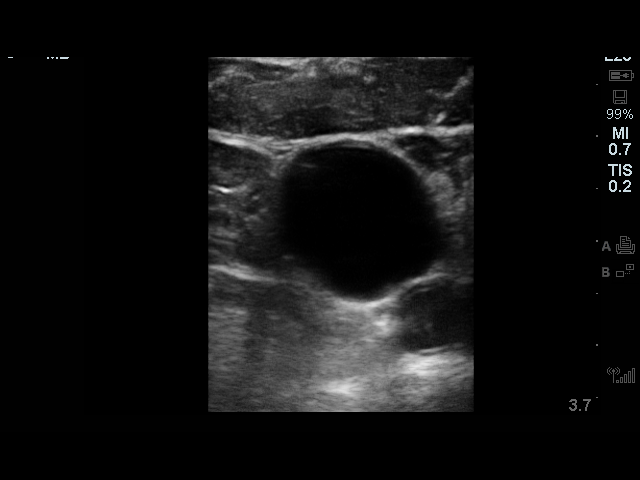

[1 of 1 positions shown; findings below may reference images not displayed]

EXAM:
TUNNELED CENTRAL VENOUS HEMODIALYSIS CATHETER PLACEMENT WITH
ULTRASOUND AND FLUOROSCOPIC GUIDANCE

MEDICATIONS:
Ancef 2 gm IV . The antibiotic was given in an appropriate time
interval prior to skin puncture.

ANESTHESIA/SEDATION:
Versed 1 mg IV; Fentanyl 50 mcg IV;

Moderate Sedation Time:  17 minutes

The patient was continuously monitored during the procedure by the
interventional radiology nurse under my direct supervision.

FLUOROSCOPY TIME:  Fluoroscopy Time: 12 seconds (2 mGy).

COMPLICATIONS:
None immediate.



After creating a small venotomy incision, a micropuncture kit was
utilized to access the right internal jugular vein under direct,
real-time ultrasound guidance after the overlying soft tissues were
anesthetized with 1% lidocaine with epinephrine. Ultrasound image
documentation was performed. The microwire was kinked to measure
appropriate catheter length. A stiff Glidewire was advanced to the
level of the IVC and the micropuncture sheath was exchanged for a
peel-away sheath. A Palindrome tunneled hemodialysis catheter
measuring 19 cm from tip to cuff was tunneled in a retrograde
fashion from the anterior chest wall to the venotomy incision.

The catheter was then placed through the peel-away sheath with tips
ultimately positioned within the superior aspect of the right
atrium. Final catheter positioning was confirmed and documented with
a spot radiographic image. The catheter aspirates and flushes
normally. The catheter was flushed with appropriate volume heparin
dwells.

The catheter exit site was secured with a 0-Prolene retention
suture. The venotomy incision was closed with an interrupted 4-0
Vicryl, Dermabond and Kyazam. Dressings were applied. The
patient tolerated the procedure well without immediate post
procedural complication.
IMPRESSION: Successful placement of 19 cm tip to cuff tunneled hemodialysis
catheter via the right internal jugular vein with tips terminating
within the superior aspect of the right atrium. The catheter is
ready for immediate use.

## 2017-08-22 DIAGNOSIS — N186 End stage renal disease: Secondary | ICD-10-CM | POA: Diagnosis not present

## 2017-08-22 DIAGNOSIS — N2581 Secondary hyperparathyroidism of renal origin: Secondary | ICD-10-CM | POA: Diagnosis not present

## 2017-08-23 DIAGNOSIS — N2581 Secondary hyperparathyroidism of renal origin: Secondary | ICD-10-CM | POA: Diagnosis not present

## 2017-08-23 DIAGNOSIS — N186 End stage renal disease: Secondary | ICD-10-CM | POA: Diagnosis not present

## 2017-08-24 DIAGNOSIS — N2581 Secondary hyperparathyroidism of renal origin: Secondary | ICD-10-CM | POA: Diagnosis not present

## 2017-08-24 DIAGNOSIS — R6889 Other general symptoms and signs: Secondary | ICD-10-CM | POA: Diagnosis not present

## 2017-08-24 DIAGNOSIS — N186 End stage renal disease: Secondary | ICD-10-CM | POA: Diagnosis not present

## 2017-08-25 DIAGNOSIS — N2581 Secondary hyperparathyroidism of renal origin: Secondary | ICD-10-CM | POA: Diagnosis not present

## 2017-08-25 DIAGNOSIS — N186 End stage renal disease: Secondary | ICD-10-CM | POA: Diagnosis not present

## 2017-08-26 DIAGNOSIS — N2581 Secondary hyperparathyroidism of renal origin: Secondary | ICD-10-CM | POA: Diagnosis not present

## 2017-08-26 DIAGNOSIS — N186 End stage renal disease: Secondary | ICD-10-CM | POA: Diagnosis not present

## 2017-08-27 DIAGNOSIS — N186 End stage renal disease: Secondary | ICD-10-CM | POA: Diagnosis not present

## 2017-08-27 DIAGNOSIS — N2581 Secondary hyperparathyroidism of renal origin: Secondary | ICD-10-CM | POA: Diagnosis not present

## 2017-08-28 DIAGNOSIS — N186 End stage renal disease: Secondary | ICD-10-CM | POA: Diagnosis not present

## 2017-08-28 DIAGNOSIS — N2581 Secondary hyperparathyroidism of renal origin: Secondary | ICD-10-CM | POA: Diagnosis not present

## 2017-08-29 DIAGNOSIS — N186 End stage renal disease: Secondary | ICD-10-CM | POA: Diagnosis not present

## 2017-08-29 DIAGNOSIS — N2581 Secondary hyperparathyroidism of renal origin: Secondary | ICD-10-CM | POA: Diagnosis not present

## 2017-08-30 DIAGNOSIS — N2581 Secondary hyperparathyroidism of renal origin: Secondary | ICD-10-CM | POA: Diagnosis not present

## 2017-08-30 DIAGNOSIS — N186 End stage renal disease: Secondary | ICD-10-CM | POA: Diagnosis not present

## 2017-08-31 DIAGNOSIS — N2581 Secondary hyperparathyroidism of renal origin: Secondary | ICD-10-CM | POA: Diagnosis not present

## 2017-08-31 DIAGNOSIS — N186 End stage renal disease: Secondary | ICD-10-CM | POA: Diagnosis not present

## 2017-09-01 DIAGNOSIS — N186 End stage renal disease: Secondary | ICD-10-CM | POA: Diagnosis not present

## 2017-09-01 DIAGNOSIS — N2581 Secondary hyperparathyroidism of renal origin: Secondary | ICD-10-CM | POA: Diagnosis not present

## 2017-09-02 DIAGNOSIS — N2581 Secondary hyperparathyroidism of renal origin: Secondary | ICD-10-CM | POA: Diagnosis not present

## 2017-09-02 DIAGNOSIS — N186 End stage renal disease: Secondary | ICD-10-CM | POA: Diagnosis not present

## 2017-09-03 DIAGNOSIS — N186 End stage renal disease: Secondary | ICD-10-CM | POA: Diagnosis not present

## 2017-09-03 DIAGNOSIS — N2581 Secondary hyperparathyroidism of renal origin: Secondary | ICD-10-CM | POA: Diagnosis not present

## 2017-09-04 DIAGNOSIS — N2581 Secondary hyperparathyroidism of renal origin: Secondary | ICD-10-CM | POA: Diagnosis not present

## 2017-09-04 DIAGNOSIS — N186 End stage renal disease: Secondary | ICD-10-CM | POA: Diagnosis not present

## 2017-09-05 DIAGNOSIS — N2581 Secondary hyperparathyroidism of renal origin: Secondary | ICD-10-CM | POA: Diagnosis not present

## 2017-09-05 DIAGNOSIS — I150 Renovascular hypertension: Secondary | ICD-10-CM | POA: Diagnosis not present

## 2017-09-05 DIAGNOSIS — N186 End stage renal disease: Secondary | ICD-10-CM | POA: Diagnosis not present

## 2017-09-05 DIAGNOSIS — Z992 Dependence on renal dialysis: Secondary | ICD-10-CM | POA: Diagnosis not present

## 2017-09-06 DIAGNOSIS — N186 End stage renal disease: Secondary | ICD-10-CM | POA: Diagnosis not present

## 2017-09-06 DIAGNOSIS — N2581 Secondary hyperparathyroidism of renal origin: Secondary | ICD-10-CM | POA: Diagnosis not present

## 2017-09-07 DIAGNOSIS — N186 End stage renal disease: Secondary | ICD-10-CM | POA: Diagnosis not present

## 2017-09-07 DIAGNOSIS — N2581 Secondary hyperparathyroidism of renal origin: Secondary | ICD-10-CM | POA: Diagnosis not present

## 2017-09-08 DIAGNOSIS — N2581 Secondary hyperparathyroidism of renal origin: Secondary | ICD-10-CM | POA: Diagnosis not present

## 2017-09-08 DIAGNOSIS — N186 End stage renal disease: Secondary | ICD-10-CM | POA: Diagnosis not present

## 2017-09-09 DIAGNOSIS — N2581 Secondary hyperparathyroidism of renal origin: Secondary | ICD-10-CM | POA: Diagnosis not present

## 2017-09-09 DIAGNOSIS — N186 End stage renal disease: Secondary | ICD-10-CM | POA: Diagnosis not present

## 2017-09-10 DIAGNOSIS — N186 End stage renal disease: Secondary | ICD-10-CM | POA: Diagnosis not present

## 2017-09-10 DIAGNOSIS — N2581 Secondary hyperparathyroidism of renal origin: Secondary | ICD-10-CM | POA: Diagnosis not present

## 2017-09-11 DIAGNOSIS — N186 End stage renal disease: Secondary | ICD-10-CM | POA: Diagnosis not present

## 2017-09-11 DIAGNOSIS — N2581 Secondary hyperparathyroidism of renal origin: Secondary | ICD-10-CM | POA: Diagnosis not present

## 2017-09-12 DIAGNOSIS — N2581 Secondary hyperparathyroidism of renal origin: Secondary | ICD-10-CM | POA: Diagnosis not present

## 2017-09-12 DIAGNOSIS — N186 End stage renal disease: Secondary | ICD-10-CM | POA: Diagnosis not present

## 2017-09-13 DIAGNOSIS — N186 End stage renal disease: Secondary | ICD-10-CM | POA: Diagnosis not present

## 2017-09-13 DIAGNOSIS — N2581 Secondary hyperparathyroidism of renal origin: Secondary | ICD-10-CM | POA: Diagnosis not present

## 2017-09-14 DIAGNOSIS — N2581 Secondary hyperparathyroidism of renal origin: Secondary | ICD-10-CM | POA: Diagnosis not present

## 2017-09-14 DIAGNOSIS — N186 End stage renal disease: Secondary | ICD-10-CM | POA: Diagnosis not present

## 2017-09-15 DIAGNOSIS — N2581 Secondary hyperparathyroidism of renal origin: Secondary | ICD-10-CM | POA: Diagnosis not present

## 2017-09-15 DIAGNOSIS — N186 End stage renal disease: Secondary | ICD-10-CM | POA: Diagnosis not present

## 2017-09-16 DIAGNOSIS — N2581 Secondary hyperparathyroidism of renal origin: Secondary | ICD-10-CM | POA: Diagnosis not present

## 2017-09-16 DIAGNOSIS — N186 End stage renal disease: Secondary | ICD-10-CM | POA: Diagnosis not present

## 2017-09-17 DIAGNOSIS — N2581 Secondary hyperparathyroidism of renal origin: Secondary | ICD-10-CM | POA: Diagnosis not present

## 2017-09-17 DIAGNOSIS — N186 End stage renal disease: Secondary | ICD-10-CM | POA: Diagnosis not present

## 2017-09-18 DIAGNOSIS — N186 End stage renal disease: Secondary | ICD-10-CM | POA: Diagnosis not present

## 2017-09-18 DIAGNOSIS — N2581 Secondary hyperparathyroidism of renal origin: Secondary | ICD-10-CM | POA: Diagnosis not present

## 2017-09-19 DIAGNOSIS — N2581 Secondary hyperparathyroidism of renal origin: Secondary | ICD-10-CM | POA: Diagnosis not present

## 2017-09-19 DIAGNOSIS — N186 End stage renal disease: Secondary | ICD-10-CM | POA: Diagnosis not present

## 2017-09-20 DIAGNOSIS — N2581 Secondary hyperparathyroidism of renal origin: Secondary | ICD-10-CM | POA: Diagnosis not present

## 2017-09-20 DIAGNOSIS — N186 End stage renal disease: Secondary | ICD-10-CM | POA: Diagnosis not present

## 2017-09-21 DIAGNOSIS — N186 End stage renal disease: Secondary | ICD-10-CM | POA: Diagnosis not present

## 2017-09-21 DIAGNOSIS — N2581 Secondary hyperparathyroidism of renal origin: Secondary | ICD-10-CM | POA: Diagnosis not present

## 2017-09-22 DIAGNOSIS — N186 End stage renal disease: Secondary | ICD-10-CM | POA: Diagnosis not present

## 2017-09-22 DIAGNOSIS — N2581 Secondary hyperparathyroidism of renal origin: Secondary | ICD-10-CM | POA: Diagnosis not present

## 2017-09-23 DIAGNOSIS — N186 End stage renal disease: Secondary | ICD-10-CM | POA: Diagnosis not present

## 2017-09-23 DIAGNOSIS — N2581 Secondary hyperparathyroidism of renal origin: Secondary | ICD-10-CM | POA: Diagnosis not present

## 2017-09-24 DIAGNOSIS — N2581 Secondary hyperparathyroidism of renal origin: Secondary | ICD-10-CM | POA: Diagnosis not present

## 2017-09-24 DIAGNOSIS — N186 End stage renal disease: Secondary | ICD-10-CM | POA: Diagnosis not present

## 2017-09-25 DIAGNOSIS — N186 End stage renal disease: Secondary | ICD-10-CM | POA: Diagnosis not present

## 2017-09-25 DIAGNOSIS — N2581 Secondary hyperparathyroidism of renal origin: Secondary | ICD-10-CM | POA: Diagnosis not present

## 2017-09-26 DIAGNOSIS — N2581 Secondary hyperparathyroidism of renal origin: Secondary | ICD-10-CM | POA: Diagnosis not present

## 2017-09-26 DIAGNOSIS — N186 End stage renal disease: Secondary | ICD-10-CM | POA: Diagnosis not present

## 2017-09-27 DIAGNOSIS — N186 End stage renal disease: Secondary | ICD-10-CM | POA: Diagnosis not present

## 2017-09-27 DIAGNOSIS — N2581 Secondary hyperparathyroidism of renal origin: Secondary | ICD-10-CM | POA: Diagnosis not present

## 2017-09-28 DIAGNOSIS — N186 End stage renal disease: Secondary | ICD-10-CM | POA: Diagnosis not present

## 2017-09-28 DIAGNOSIS — N2581 Secondary hyperparathyroidism of renal origin: Secondary | ICD-10-CM | POA: Diagnosis not present

## 2017-09-29 DIAGNOSIS — N186 End stage renal disease: Secondary | ICD-10-CM | POA: Diagnosis not present

## 2017-09-29 DIAGNOSIS — N2581 Secondary hyperparathyroidism of renal origin: Secondary | ICD-10-CM | POA: Diagnosis not present

## 2017-09-30 DIAGNOSIS — N186 End stage renal disease: Secondary | ICD-10-CM | POA: Diagnosis not present

## 2017-09-30 DIAGNOSIS — N2581 Secondary hyperparathyroidism of renal origin: Secondary | ICD-10-CM | POA: Diagnosis not present

## 2017-10-01 DIAGNOSIS — N186 End stage renal disease: Secondary | ICD-10-CM | POA: Diagnosis not present

## 2017-10-01 DIAGNOSIS — N2581 Secondary hyperparathyroidism of renal origin: Secondary | ICD-10-CM | POA: Diagnosis not present

## 2017-10-02 DIAGNOSIS — N186 End stage renal disease: Secondary | ICD-10-CM | POA: Diagnosis not present

## 2017-10-02 DIAGNOSIS — N2581 Secondary hyperparathyroidism of renal origin: Secondary | ICD-10-CM | POA: Diagnosis not present

## 2017-10-03 DIAGNOSIS — N186 End stage renal disease: Secondary | ICD-10-CM | POA: Diagnosis not present

## 2017-10-03 DIAGNOSIS — N2581 Secondary hyperparathyroidism of renal origin: Secondary | ICD-10-CM | POA: Diagnosis not present

## 2017-10-04 DIAGNOSIS — N186 End stage renal disease: Secondary | ICD-10-CM | POA: Diagnosis not present

## 2017-10-04 DIAGNOSIS — N2581 Secondary hyperparathyroidism of renal origin: Secondary | ICD-10-CM | POA: Diagnosis not present

## 2017-10-05 DIAGNOSIS — N2581 Secondary hyperparathyroidism of renal origin: Secondary | ICD-10-CM | POA: Diagnosis not present

## 2017-10-05 DIAGNOSIS — N186 End stage renal disease: Secondary | ICD-10-CM | POA: Diagnosis not present

## 2017-10-06 DIAGNOSIS — I150 Renovascular hypertension: Secondary | ICD-10-CM | POA: Diagnosis not present

## 2017-10-06 DIAGNOSIS — N186 End stage renal disease: Secondary | ICD-10-CM | POA: Diagnosis not present

## 2017-10-06 DIAGNOSIS — N2581 Secondary hyperparathyroidism of renal origin: Secondary | ICD-10-CM | POA: Diagnosis not present

## 2017-10-06 DIAGNOSIS — Z992 Dependence on renal dialysis: Secondary | ICD-10-CM | POA: Diagnosis not present

## 2017-10-07 DIAGNOSIS — N2581 Secondary hyperparathyroidism of renal origin: Secondary | ICD-10-CM | POA: Diagnosis not present

## 2017-10-07 DIAGNOSIS — N186 End stage renal disease: Secondary | ICD-10-CM | POA: Diagnosis not present

## 2017-10-08 DIAGNOSIS — N186 End stage renal disease: Secondary | ICD-10-CM | POA: Diagnosis not present

## 2017-10-08 DIAGNOSIS — N2581 Secondary hyperparathyroidism of renal origin: Secondary | ICD-10-CM | POA: Diagnosis not present

## 2017-10-09 DIAGNOSIS — J309 Allergic rhinitis, unspecified: Secondary | ICD-10-CM | POA: Diagnosis not present

## 2017-10-09 DIAGNOSIS — J300 Vasomotor rhinitis: Secondary | ICD-10-CM | POA: Diagnosis not present

## 2017-10-09 DIAGNOSIS — N2581 Secondary hyperparathyroidism of renal origin: Secondary | ICD-10-CM | POA: Diagnosis not present

## 2017-10-09 DIAGNOSIS — K219 Gastro-esophageal reflux disease without esophagitis: Secondary | ICD-10-CM | POA: Diagnosis not present

## 2017-10-09 DIAGNOSIS — N186 End stage renal disease: Secondary | ICD-10-CM | POA: Diagnosis not present

## 2017-10-10 DIAGNOSIS — N2581 Secondary hyperparathyroidism of renal origin: Secondary | ICD-10-CM | POA: Diagnosis not present

## 2017-10-10 DIAGNOSIS — N186 End stage renal disease: Secondary | ICD-10-CM | POA: Diagnosis not present

## 2017-10-11 DIAGNOSIS — N2581 Secondary hyperparathyroidism of renal origin: Secondary | ICD-10-CM | POA: Diagnosis not present

## 2017-10-11 DIAGNOSIS — N186 End stage renal disease: Secondary | ICD-10-CM | POA: Diagnosis not present

## 2017-10-12 DIAGNOSIS — N2581 Secondary hyperparathyroidism of renal origin: Secondary | ICD-10-CM | POA: Diagnosis not present

## 2017-10-12 DIAGNOSIS — N186 End stage renal disease: Secondary | ICD-10-CM | POA: Diagnosis not present

## 2017-10-13 DIAGNOSIS — N186 End stage renal disease: Secondary | ICD-10-CM | POA: Diagnosis not present

## 2017-10-13 DIAGNOSIS — N2581 Secondary hyperparathyroidism of renal origin: Secondary | ICD-10-CM | POA: Diagnosis not present

## 2017-10-14 DIAGNOSIS — N186 End stage renal disease: Secondary | ICD-10-CM | POA: Diagnosis not present

## 2017-10-14 DIAGNOSIS — N2581 Secondary hyperparathyroidism of renal origin: Secondary | ICD-10-CM | POA: Diagnosis not present

## 2017-10-14 IMAGING — CT NM PET TUM IMG INITIAL (PI) SKULL BASE T - THIGH
1 of 7 series · 1 of 25 positions shown · non-contrast
Comparison: CT chest 07/29/2016 and 06/20/2016.  PET 09/03/2008.

CLINICAL DATA: Initial treatment strategy for pulmonary nodules.

EXAM:
NUCLEAR MEDICINE PET SKULL BASE TO THIGH
TECHNIQUE: 5 point for mCi F-18 FDG was injected intravenously. Full-ring PET
imaging was performed from the skull base to thigh after the
radiotracer. CT data was obtained and used for attenuation
correction and anatomic localization.
FASTING BLOOD GLUCOSE:  Value: 07/14/1968 mg/dl

[Series 3: pet sk_thigh ac · axial · 5.0mm · 4.07mm/px · 1 of 175 slices shown]
[im 88/175]
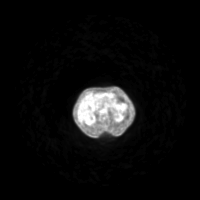

[1 of 25 positions shown; findings below may reference images not displayed]

FINDINGS: NECK

No hypermetabolic lymph nodes in the neck. CT images show no acute
findings.

CHEST

No hypermetabolic mediastinal, hilar or axillary lymph nodes. No
abnormal hypermetabolism in the lungs. Right IJ dialysis catheter
tips are at the SVC RA junction and in the right atrium. Heart is at
the upper limits of normal in size. No pericardial or pleural
effusion. Multifocal scarring and architectural distortion in the
lungs bilaterally, unchanged from 09/03/2008. Post biopsy changes in
the left lung is well.

ABDOMEN/PELVIS

No abnormal hypermetabolism in the liver, adrenal glands, spleen or
pancreas. No hypermetabolic lymph nodes. Liver, gallbladder, adrenal
glands, kidneys, spleen, pancreas, stomach and bowel are grossly
unremarkable. Atherosclerotic calcification of the arterial
vasculature without abdominal aortic aneurysm. No free fluid.

SKELETON

No abnormal osseous hypermetabolism.
IMPRESSION: 1. No abnormal hypermetabolism in the neck, chest, abdomen or
pelvis.
2. Multifocal scarring and architectural distortion in the lungs
bilaterally, unchanged from 09/03/2008.
3.  Aortic atherosclerosis ([id number]-170.0).

## 2017-10-15 DIAGNOSIS — N2581 Secondary hyperparathyroidism of renal origin: Secondary | ICD-10-CM | POA: Diagnosis not present

## 2017-10-15 DIAGNOSIS — N186 End stage renal disease: Secondary | ICD-10-CM | POA: Diagnosis not present

## 2017-10-16 DIAGNOSIS — N2581 Secondary hyperparathyroidism of renal origin: Secondary | ICD-10-CM | POA: Diagnosis not present

## 2017-10-16 DIAGNOSIS — N186 End stage renal disease: Secondary | ICD-10-CM | POA: Diagnosis not present

## 2017-10-17 ENCOUNTER — Emergency Department (HOSPITAL_COMMUNITY)
Admission: EM | Admit: 2017-10-17 | Discharge: 2017-10-17 | Disposition: A | Discharge status: Home / Self Care | Payer: Medicare HMO | Attending: Emergency Medicine | Admitting: Emergency Medicine

## 2017-10-17 ENCOUNTER — Encounter (HOSPITAL_COMMUNITY): Payer: Self-pay | Admitting: Emergency Medicine

## 2017-10-17 ENCOUNTER — Emergency Department (HOSPITAL_COMMUNITY): Payer: Medicare HMO

## 2017-10-17 ENCOUNTER — Other Ambulatory Visit: Payer: Self-pay

## 2017-10-17 DIAGNOSIS — N186 End stage renal disease: Secondary | ICD-10-CM | POA: Diagnosis not present

## 2017-10-17 DIAGNOSIS — R918 Other nonspecific abnormal finding of lung field: Secondary | ICD-10-CM | POA: Diagnosis not present

## 2017-10-17 DIAGNOSIS — R221 Localized swelling, mass and lump, neck: Secondary | ICD-10-CM | POA: Diagnosis not present

## 2017-10-17 DIAGNOSIS — Z79899 Other long term (current) drug therapy: Secondary | ICD-10-CM | POA: Insufficient documentation

## 2017-10-17 DIAGNOSIS — J449 Chronic obstructive pulmonary disease, unspecified: Secondary | ICD-10-CM | POA: Diagnosis not present

## 2017-10-17 DIAGNOSIS — Z7982 Long term (current) use of aspirin: Secondary | ICD-10-CM | POA: Diagnosis not present

## 2017-10-17 DIAGNOSIS — K115 Sialolithiasis: Secondary | ICD-10-CM | POA: Diagnosis not present

## 2017-10-17 DIAGNOSIS — I120 Hypertensive chronic kidney disease with stage 5 chronic kidney disease or end stage renal disease: Secondary | ICD-10-CM | POA: Insufficient documentation

## 2017-10-17 DIAGNOSIS — R220 Localized swelling, mass and lump, head: Secondary | ICD-10-CM | POA: Diagnosis not present

## 2017-10-17 DIAGNOSIS — Z87891 Personal history of nicotine dependence: Secondary | ICD-10-CM | POA: Diagnosis not present

## 2017-10-17 DIAGNOSIS — Z7289 Other problems related to lifestyle: Secondary | ICD-10-CM | POA: Diagnosis not present

## 2017-10-17 DIAGNOSIS — Z992 Dependence on renal dialysis: Secondary | ICD-10-CM | POA: Diagnosis not present

## 2017-10-17 DIAGNOSIS — N2581 Secondary hyperparathyroidism of renal origin: Secondary | ICD-10-CM | POA: Diagnosis not present

## 2017-10-17 HISTORY — DX: Disorder of kidney and ureter, unspecified: N28.9

## 2017-10-17 LAB — COMPREHENSIVE METABOLIC PANEL
ALT: 23 U/L (ref 14–54)
ANION GAP: 12 (ref 5–15)
AST: 32 U/L (ref 15–41)
Albumin: 3.5 g/dL (ref 3.5–5.0)
Alkaline Phosphatase: 76 U/L (ref 38–126)
BUN: 29 mg/dL — ABNORMAL HIGH (ref 6–20)
CHLORIDE: 100 mmol/L — AB (ref 101–111)
CO2: 29 mmol/L (ref 22–32)
CREATININE: 4.91 mg/dL — AB (ref 0.44–1.00)
Calcium: 9.6 mg/dL (ref 8.9–10.3)
GFR calc non Af Amer: 8 mL/min — ABNORMAL LOW (ref >60)
GFR, EST AFRICAN AMERICAN: 9 mL/min — AB (ref >60)
Glucose, Bld: 94 mg/dL (ref 65–99)
Potassium: 4.1 mmol/L (ref 3.5–5.1)
SODIUM: 141 mmol/L (ref 135–145)
Total Bilirubin: 1.1 mg/dL (ref 0.3–1.2)
Total Protein: 6.5 g/dL (ref 6.5–8.1)

## 2017-10-17 LAB — CBC WITH DIFFERENTIAL/PLATELET
Abs Immature Granulocytes: 0 10*3/uL (ref 0.0–0.1)
BASOS ABS: 0.1 10*3/uL (ref 0.0–0.1)
BASOS PCT: 1 %
EOS ABS: 0.1 10*3/uL (ref 0.0–0.7)
EOS PCT: 1 %
HCT: 40.2 % (ref 36.0–46.0)
Hemoglobin: 12.9 g/dL (ref 12.0–15.0)
Immature Granulocytes: 0 %
Lymphocytes Relative: 15 %
Lymphs Abs: 1.3 10*3/uL (ref 0.7–4.0)
MCH: 29.5 pg (ref 26.0–34.0)
MCHC: 32.1 g/dL (ref 30.0–36.0)
MCV: 91.8 fL (ref 78.0–100.0)
MONO ABS: 0.7 10*3/uL (ref 0.1–1.0)
MONOS PCT: 8 %
NEUTROS ABS: 6.4 10*3/uL (ref 1.7–7.7)
Neutrophils Relative %: 75 %
PLATELETS: 228 10*3/uL (ref 150–400)
RBC: 4.38 MIL/uL (ref 3.87–5.11)
RDW: 14.2 % (ref 11.5–15.5)
WBC: 8.6 10*3/uL (ref 4.0–10.5)

## 2017-10-17 MED ORDER — LIDOCAINE-EPINEPHRINE 1 %-1:100000 IJ SOLN
10.0000 mL | Freq: Once | INTRAMUSCULAR | Status: DC
  Filled 2017-10-17: qty 10

## 2017-10-17 MED ORDER — HYDROMORPHONE HCL 2 MG/ML IJ SOLN
0.5000 mg | Freq: Once | INTRAMUSCULAR | Status: AC
  Administered 2017-10-17: 0.5 mg via INTRAVENOUS
  Filled 2017-10-17: qty 1

## 2017-10-17 MED ORDER — CLINDAMYCIN HCL 300 MG PO CAPS: 300.0000 mg | ORAL_CAPSULE | Freq: Three times a day (TID) | ORAL | 0 refills | Status: AC

## 2017-10-17 MED ORDER — CLINDAMYCIN PHOSPHATE 600 MG/50ML IV SOLN
600.0000 mg | Freq: Once | INTRAVENOUS | Status: AC
  Administered 2017-10-17: 600 mg via INTRAVENOUS
  Filled 2017-10-17: qty 50

## 2017-10-17 MED ORDER — IOHEXOL 300 MG/ML  SOLN
100.0000 mL | Freq: Once | INTRAMUSCULAR | Status: AC | PRN
  Administered 2017-10-17: 75 mL via INTRAVENOUS

## 2017-10-17 MED ORDER — LIDOCAINE-EPINEPHRINE (PF) 2 %-1:200000 IJ SOLN
10.0000 mL | Freq: Once | INTRAMUSCULAR | Status: AC
  Administered 2017-10-17: 10 mL via INTRADERMAL
  Filled 2017-10-17: qty 20

## 2017-10-17 MED ORDER — DEXAMETHASONE SODIUM PHOSPHATE 10 MG/ML IJ SOLN
10.0000 mg | Freq: Once | INTRAMUSCULAR | Status: AC
  Administered 2017-10-17: 10 mg via INTRAVENOUS
  Filled 2017-10-17: qty 1

## 2017-10-17 MED ORDER — HYDROCODONE-ACETAMINOPHEN 5-325 MG PO TABS: 2.0000 | ORAL_TABLET | ORAL | 0 refills | Status: DC | PRN

## 2017-10-17 NOTE — ED Notes (Signed)
ENT at bedside

## 2017-10-17 NOTE — ED Notes (Signed)
Pt verbalized understanding of discharge instructions and denies any further questions at this time.   

## 2017-10-17 NOTE — ED Triage Notes (Signed)
Pt. Stated, I have a knot on my neck since yesterday.

## 2017-10-17 NOTE — ED Provider Notes (Signed)
[facility] EMERGENCY DEPARTMENT Provider Note   CSN: [id number] Arrival date & time: 10/17/17  0831     History   Chief Complaint Chief Complaint  Patient presents with  . Cyst  . Neck Pain    HPI [name] is a 72 y.o. female.  HPI  [name] is a 72yo female with a history of ESRD, hypertension, COPD, DJD, osteopenia who presents to the emergency department for evaluation of right-sided neck swelling.  Patient reports that she noticed swelling below her right jaw yesterday evening, states that it was much worse when she woke up this morning.  She reports 9/10 severity pain over the area of swelling which is worsened with palpation, opening the mouth, neck movement or swallowing.  She reports that she is able to swallow, although it is very uncomfortable and she has been using suction at bedside to help with her secretions.  She denies any recent dental procedures.  Checked her temperature at home today and reports that she did not have a fever.  Denies dental pain, sore throat, trouble breathing, voice change, drooling, chest pain.  Past Medical History:  Diagnosis Date  . Acute kidney injury ([place]) 07/20/2016  . Allergic rhinitis   . Anxiety   . CAP (community acquired pneumonia) 06/2016   [name] 06/19/2016  . Cervical disc disease    Archie Endo 07/20/2016  . Chest pain at rest 06/18/2012  . COPD (chronic obstructive pulmonary disease) ([place]) 05/23/2012  . Diverticulosis   . DJD (degenerative joint disease)   . Esophageal diverticulum   . Esophageal stricture   . GERD (gastroesophageal reflux disease)   . Hiatal hernia   . History of blood transfusion 06/2016   "when I was hospitalized w/sepsis"  . Hyperlipidemia 05/23/2012   T. Chol 234, LDL 130   . Hypertension   . IBS (irritable bowel syndrome)   . Lung nodules    hx  with extensive workup including lung biopsy ruling out Sjogren's disease/notes 07/20/2016  . Osteopenia   . Recurrent  sinusitis    [name] 07/20/2016  . Sinus headache    "daily lately" (07/20/2016)  . Traumatic arthritis     Patient Active Problem List   Diagnosis Date Noted  . Pulmonary nodules   . ESRD (end stage renal disease) on dialysis ([place])   . 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 Hyperphosphatemia 07/25/2016  . Acute renal failure (ARF) ([place])   . Essential hypertension 12/25/2007    Past Surgical History:  Procedure Laterality Date  . ANTERIOR CERVICAL DECOMP/DISCECTOMY FUSION  2005  . BACK SURGERY    . COLONOSCOPY  2001   Archie Endo 09/21/2010  . CYSTECTOMY Bilateral    hx/notes 09/21/2010  . IR GENERIC HISTORICAL  07/20/2016   IR FLUORO GUIDE CV LINE RIGHT 07/20/2016 [name], MD [facility]  . IR GENERIC HISTORICAL  07/20/2016   IR US GUIDE VASC ACCESS RIGHT 07/20/2016 [name], MD [facility]  . LUNG BIOPSY Left    left side, not cancerous, thoracotomy  . TONSILLECTOMY AND ADENOIDECTOMY     hx/notes 09/21/2010  . VAGINAL HYSTERECTOMY     hx w/oophorectomy/notes 09/21/2010     OB History   None      Home Medications    Prior to Admission medications   Medication Sig Start Date End Date Taking? Authorizing Provider  amLODipine (NORVASC) 5 MG tablet Take 5 mg by mouth daily.    [provider]  aspirin EC 81 MG tablet Take 81 mg by mouth daily.    [provider]  atorvastatin (LIPITOR) 10 MG tablet Take 1 tablet (10 mg total) by mouth daily. 10/20/15   Biagio Borg, MD  bisacodyl (DULCOLAX) 10 MG suppository Place 1 suppository (10 mg total) rectally daily as needed for moderate constipation. 08/14/16   Hongalgi, Lenis Dickinson, MD  Multiple Vitamin (MULTIVITAMIN) tablet Take 1 tablet by mouth daily.    [provider]  pantoprazole (PROTONIX) 40 MG tablet Take 1 tablet (40 mg total) by mouth daily. 10/20/15   Biagio Borg, MD  polyethylene glycol [facility] Floria Raveling) packet Take 17 g by  mouth daily. 08/15/16   Hongalgi, Lenis Dickinson, MD  predniSONE (DELTASONE) 20 MG tablet Take 1 tablet (20 mg total) by mouth daily with breakfast. 08/15/16   Hongalgi, Lenis Dickinson, MD  sodium chloride (OCEAN) 0.65 % SOLN nasal spray Place 2 sprays into both nostrils as needed.     [provider]  sulfamethoxazole-trimethoprim (BACTRIM DS,SEPTRA DS) 800-160 MG tablet Take 1 tablet by mouth 3 (three) times a week. 08/15/16   Hongalgi, Lenis Dickinson, MD    Family History Family History  Problem Relation Age of Onset  . Throat cancer Father   . Hypertension Sister   . Thyroid disease Daughter   . Hypertension Sister        x 2  . Breast cancer Maternal Aunt   . Blindness Sister        legally blind  . Colon cancer Neg Hx   . Stomach cancer Neg Hx     Social History Social History   Tobacco Use  . Smoking status: Former Smoker    Packs/day: 0.12    Years: 8.00    Pack years: 0.96    Types: Cigarettes    Last attempt to quit: 05/04/1985    Years since quitting: 32.4  . Smokeless tobacco: Never Used  Substance Use Topics  . Alcohol use: Yes    Alcohol/week: 0.6 oz    Types: 1 Cans of beer per week  . Drug use: No     Allergies   Ace inhibitors; Other; Pneumococcal vaccines; and Tetanus toxoid   Review of Systems Review of Systems  Constitutional: Negative for chills and fever.  HENT: Positive for facial swelling (right submandibular swelling) and trouble swallowing (painful). Negative for sore throat.   Respiratory: Negative for shortness of breath.   Cardiovascular: Negative for chest pain.  Gastrointestinal: Negative for abdominal pain, nausea and vomiting.  Musculoskeletal: Positive for neck pain. Negative for gait problem.  Skin: Negative for color change and wound.  Neurological: Negative for light-headedness.  Psychiatric/Behavioral: Negative for agitation.  All other systems reviewed and are negative.    Physical Exam Updated Vital Signs BP (!) 126/49 (BP Location:  Right Arm)   Pulse 87   Temp 98.9 F (37.2 C) (Oral)   Resp 16   Ht 5' (1.524 m)   Wt 49 kg (108 lb)   SpO2 100%   BMI 21.09 kg/m   Physical Exam  Constitutional: She is oriented to person, place, and time. She appears well-developed and well-nourished. No distress.  No acute distress, nontoxic-appearing.  HENT:  Head: Normocephalic and atraumatic.  Unable to view the posterior oropharynx due to trismus.  Eyes: Pupils are equal, round, and reactive to light. Conjunctivae are normal. Right eye exhibits no discharge. Left eye exhibits no discharge.  Neck: Neck supple.  Approximately  4 cm area of notable swelling in the right submandibular space which is acutely tender to palpation.  No overlying erythema, warmth or induration.  Patient exquisitely tender with neck flexion and left rotation.   Cardiovascular: Normal rate and regular rhythm.  Pulmonary/Chest: Effort normal and breath sounds normal. No stridor. No respiratory distress. She has no wheezes. She has no rales.  Abdominal: Soft. There is no tenderness.  Musculoskeletal: Normal range of motion.  Neurological: She is alert and oriented to person, place, and time. Coordination normal.  Skin: Skin is warm and dry. She is not diaphoretic.  Psychiatric: She has a normal mood and affect. Her behavior is normal.  Nursing note and vitals reviewed.    ED Treatments / Results  Labs (all labs ordered are listed, but only abnormal results are displayed) Labs Reviewed  COMPREHENSIVE METABOLIC PANEL - Abnormal; Notable for the following components:      Result Value   Chloride 100 (*)    BUN 29 (*)    Creatinine, Ser 4.91 (*)    GFR calc non Af Amer 8 (*)    GFR calc Af Amer 9 (*)    All other components within normal limits  CBC WITH DIFFERENTIAL/PLATELET    EKG None  Radiology Ct Soft Tissue Neck W Contrast  Result Date: 10/17/2017 CLINICAL DATA:  Knot on the RIGHT side of the jaw. Difficulty swallowing. EXAM: CT NECK  WITH CONTRAST TECHNIQUE: Multidetector CT imaging of the neck was performed using the standard protocol following the bolus administration of intravenous contrast. CONTRAST:  21mL OMNIPAQUE IOHEXOL 300 MG/ML  SOLN COMPARISON:  07/20/2016 paranasal sinus CT.  PET scan 09/12/2016. FINDINGS: Pharynx and larynx: Normal. No mass or swelling. Salivary glands: Enlarged, and inflamed RIGHT submandibular gland. Dilated RIGHT submandibular gland duct with a 2 x 3 mm stone lodged at the orifice on the RIGHT floor of mouth. There is edema in the region of the mylohyoid sling, between the submental and submandibular spaces, which I suspect represents localized rupture of the mid SMG duct. The LEFT SMG is normal sized, but there is central ductal dilatation, and I suspect that previous passage of calculi on this side is likely responsible. Parotid glands appear normal. Thyroid: Subcentimeter cysts, somewhat confluent on the LEFT. Consider sonographic evaluation. Lymph nodes: None enlarged or abnormal density. Vascular: Patent. Limited intracranial: No intracranial lesions are evident. Visualized orbits: Negative. Mastoids and visualized paranasal sinuses: No sinus disease. BILATERAL cataract extraction. Skeleton: Prior fusion, cervical spondylosis, poor dentition. Upper chest: Dilated esophagus, stable from priors. BILATERAL pulmonary opacities, incompletely evaluated, but similar to priors which have been worked up with PET scanning. Other: Compared to prior sinus CT, a RIGHT SMG stone was seen, but the ducts were not dilated. IMPRESSION: Fullness in the RIGHT submandibular space, related to a combination of submandibular gland enlargement and inflammation, and suspected mid ductal rupture with fluid related to a 2 x 3 mm distal RIGHT submandibular duct stone. Similar ductal dilatation within the LEFT submandibular gland, but no stone is seen. Dilated esophagus and BILATERAL pulmonary opacities, appears stable compared with  prior chest imaging including PET. Electronically Signed   By: Staci Righter M.D.   On: 10/17/2017 14:01    Procedures Procedures (including critical care time)  Medications Ordered in ED Medications  lidocaine-EPINEPHrine (XYLOCAINE W/EPI) 1 %-1:100000 (with pres) injection 10 mL (has no administration in time range)  clindamycin (CLEOCIN) IVPB 600 mg (0 mg Intravenous Stopped 10/17/17 1223)  dexamethasone (DECADRON) injection 10 mg (  10 mg Intravenous Given 10/17/17 1153)  iohexol (OMNIPAQUE) 300 MG/ML solution 100 mL (75 mLs Intravenous Contrast Given 10/17/17 1252)  HYDROmorphone (DILAUDID) injection 0.5 mg (0.5 mg Intravenous Given 10/17/17 1433)  lidocaine-EPINEPHrine (XYLOCAINE W/EPI) 2 %-1:200000 (PF) injection 10 mL (10 mLs Intradermal Given 10/17/17 1640)     Initial Impression / Assessment and Plan / ED Course  I have reviewed the triage vital signs and the nursing notes.  Pertinent labs & imaging results that were available during my care of the patient were reviewed by me and considered in my medical decision making (see chart for details).     CT neck shows right submandibular inflammation with 64mm x 42mm distal stone and suspected mid ductal rupture. CBC unremarkable, no leukocytosis. CMP without any major electrolyte abnormalities, creatinine elevated (4.91) patient with known ESRD.   Patient was treated with IV clindamycin and decadron. ENT Dr. Constance Holster came and saw the patient in the ED and was able to extract the stone at bedside. He recommends discharge home with clindamycin and pain management. Patient can follow up in his office as needed. Patient discharged with information to Dr. Janeice Robinson office. I have discussed with her reasons to return directly to the ER. Patient is able to tolerate po fluids prior to discharge with intact airway. She agrees and voices understanding to plan and has no complaints. This was a shared visit with Dr. Darl Householder who also saw the patient and agrees with  plan.   Final Clinical Impressions(s) / ED Diagnoses   Final diagnoses:  Salivary duct stone  Submandibular swelling    ED Discharge Orders        Ordered    clindamycin (CLEOCIN) 300 MG capsule  3 times daily     10/17/17 1717    HYDROcodone-acetaminophen (NORCO/VICODIN) 5-325 MG tablet  Every 4 hours PRN     10/17/17 1721       Glyn Ade, PA-C 10/17/17 1729    Drenda Freeze, MD 10/22/17 2015

## 2017-10-17 NOTE — ED Notes (Signed)
Patient transported to CT 

## 2017-10-17 NOTE — ED Notes (Signed)
ED Provider at bedside. 

## 2017-10-17 NOTE — Consult Note (Signed)
Reason for Consult: Submandibular infection Referring Physician: No att. providers found  [name] is an 72 y.o. female.  HPI: 2 or 3-day history of swelling in the right side of the neck and some discomfort in her throat.  She has a history of renal failure and is on peritoneal dialysis.  She had a CT of the neck which revealed a small distal calculus in the right submandibular duct, with obstruction of the duct and dilation, as well as inflammation but no abscess of the gland.  She has never had problems like this before.  Past Medical History:  Diagnosis Date  . Acute kidney injury ([place]) 07/20/2016  . Allergic rhinitis   . Anxiety   . CAP (community acquired pneumonia) 06/2016   [name] 06/19/2016  . Cervical disc disease    Archie Endo 07/20/2016  . Chest pain at rest 06/18/2012  . COPD (chronic obstructive pulmonary disease) ([place]) 05/23/2012  . Diverticulosis   . DJD (degenerative joint disease)   . Esophageal diverticulum   . Esophageal stricture   . GERD (gastroesophageal reflux disease)   . Hiatal hernia   . History of blood transfusion 06/2016   "when I was hospitalized w/sepsis"  . Hyperlipidemia 05/23/2012   T. Chol 234, LDL 130   . Hypertension   . IBS (irritable bowel syndrome)   . Lung nodules    hx  with extensive workup including lung biopsy ruling out Sjogren's disease/notes 07/20/2016  . Osteopenia   . Recurrent sinusitis    [name] 07/20/2016  . Renal insufficiency   . Sinus headache    "daily lately" (07/20/2016)  . Traumatic arthritis     Past Surgical History:  Procedure Laterality Date  . ANTERIOR CERVICAL DECOMP/DISCECTOMY FUSION  2005  . BACK SURGERY    . COLONOSCOPY  2001   Archie Endo 09/21/2010  . CYSTECTOMY Bilateral    hx/notes 09/21/2010  . IR GENERIC HISTORICAL  07/20/2016   IR FLUORO GUIDE CV LINE RIGHT 07/20/2016 [name], MD [facility]  . IR GENERIC HISTORICAL  07/20/2016   IR US GUIDE VASC ACCESS RIGHT 07/20/2016 [name], MD [facility]  . LUNG BIOPSY Left    left side, not cancerous, thoracotomy  . TONSILLECTOMY AND ADENOIDECTOMY     hx/notes 09/21/2010  . VAGINAL HYSTERECTOMY     hx w/oophorectomy/notes 09/21/2010    Family History  Problem Relation Age of Onset  . Throat cancer Father   . Hypertension Sister   . Thyroid disease Daughter   . Hypertension Sister        x 2  . Breast cancer Maternal Aunt   . Blindness Sister        legally blind  . Colon cancer Neg Hx   . Stomach cancer Neg Hx     Social History:  reports that she quit smoking about 32 years ago. Her smoking use included cigarettes. She has a 0.96 pack-year smoking history. She has never used smokeless tobacco. She reports that she drinks about 0.6 oz of alcohol per week. She reports that she does not use drugs.  Allergies:  Allergies  Allergen Reactions  . Ace Inhibitors Anaphylaxis    REACTION: angioedema  . Other Anaphylaxis    Calcium Channel Blocking Agent Diltiazem Analogues  . Pneumococcal Vaccines Other (See Comments)    Red rash, Knot (sore and itches), A fever for a couple days (per pt report)  . Tetanus Toxoid Other (See Comments)    Cellulitis in arm  Medications: Reviewed  Results for orders placed or performed during the hospital encounter of 10/17/17 (from the past 48 hour(s))  CBC with Differential/Platelet     Status: None   Collection Time: 10/17/17 11:52 AM  Result Value Ref Range   WBC 8.6 4.0 - 10.5 K/uL   RBC 4.38 3.87 - 5.11 MIL/uL   Hemoglobin 12.9 12.0 - 15.0 g/dL   HCT 40.2 36.0 - 46.0 %   MCV 91.8 78.0 - 100.0 fL   MCH 29.5 26.0 - 34.0 pg   MCHC 32.1 30.0 - 36.0 g/dL   RDW 14.2 11.5 - 15.5 %   Platelets 228 150 - 400 K/uL   Neutrophils Relative % 75 %   Neutro Abs 6.4 1.7 - 7.7 K/uL   Lymphocytes Relative 15 %   Lymphs Abs 1.3 0.7 - 4.0 K/uL   Monocytes Relative 8 %   Monocytes Absolute 0.7 0.1 - 1.0 K/uL   Eosinophils Relative 1 %   Eosinophils Absolute 0.1 0.0 - 0.7 K/uL   Basophils  Relative 1 %   Basophils Absolute 0.1 0.0 - 0.1 K/uL   Immature Granulocytes 0 %   Abs Immature Granulocytes 0.0 0.0 - 0.1 K/uL    Comment: Performed at [facility], [address], [place], [place] [address]  Comprehensive metabolic panel     Status: Abnormal   Collection Time: 10/17/17 11:52 AM  Result Value Ref Range   Sodium 141 135 - 145 mmol/L   Potassium 4.1 3.5 - 5.1 mmol/L   Chloride 100 (L) 101 - 111 mmol/L   CO2 29 22 - 32 mmol/L   Glucose, Bld 94 65 - 99 mg/dL   BUN 29 (H) 6 - 20 mg/dL   Creatinine, Ser 4.91 (H) 0.44 - 1.00 mg/dL   Calcium 9.6 8.9 - 10.3 mg/dL   Total Protein 6.5 6.5 - 8.1 g/dL   Albumin 3.5 3.5 - 5.0 g/dL   AST 32 15 - 41 U/L   ALT 23 14 - 54 U/L   Alkaline Phosphatase 76 38 - 126 U/L   Total Bilirubin 1.1 0.3 - 1.2 mg/dL   GFR calc non Af Amer 8 (L) >60 mL/min   GFR calc Af Amer 9 (L) >60 mL/min    Comment: (NOTE) The eGFR has been calculated using the CKD EPI equation. This calculation has not been validated in all clinical situations. eGFR's persistently <60 mL/min signify possible Chronic Kidney Disease.    Anion gap 12 5 - 15    Comment: Performed at [facility] [address], [place], [place] [address]    Ct Soft Tissue Neck W Contrast  Result Date: 10/17/2017 CLINICAL DATA:  Knot on the RIGHT side of the jaw. Difficulty swallowing. EXAM: CT NECK WITH CONTRAST TECHNIQUE: Multidetector CT imaging of the neck was performed using the standard protocol following the bolus administration of intravenous contrast. CONTRAST:  70m OMNIPAQUE IOHEXOL 300 MG/ML  SOLN COMPARISON:  07/20/2016 paranasal sinus CT.  PET scan 09/12/2016. FINDINGS: Pharynx and larynx: Normal. No mass or swelling. Salivary glands: Enlarged, and inflamed RIGHT submandibular gland. Dilated RIGHT submandibular gland duct with a 2 x 3 mm stone lodged at the orifice on the RIGHT floor of mouth. There is edema in the region of the mylohyoid sling, between the submental  and submandibular spaces, which I suspect represents localized rupture of the mid SMG duct. The LEFT SMG is normal sized, but there is central ductal dilatation, and I suspect that  previous passage of calculi on this side is likely responsible. Parotid glands appear normal. Thyroid: Subcentimeter cysts, somewhat confluent on the LEFT. Consider sonographic evaluation. Lymph nodes: None enlarged or abnormal density. Vascular: Patent. Limited intracranial: No intracranial lesions are evident. Visualized orbits: Negative. Mastoids and visualized paranasal sinuses: No sinus disease. BILATERAL cataract extraction. Skeleton: Prior fusion, cervical spondylosis, poor dentition. Upper chest: Dilated esophagus, stable from priors. BILATERAL pulmonary opacities, incompletely evaluated, but similar to priors which have been worked up with PET scanning. Other: Compared to prior sinus CT, a RIGHT SMG stone was seen, but the ducts were not dilated. IMPRESSION: Fullness in the RIGHT submandibular space, related to a combination of submandibular gland enlargement and inflammation, and suspected mid ductal rupture with fluid related to a 2 x 3 mm distal RIGHT submandibular duct stone. Similar ductal dilatation within the LEFT submandibular gland, but no stone is seen. Dilated esophagus and BILATERAL pulmonary opacities, appears stable compared with prior chest imaging including PET. Electronically Signed   By: Staci Righter M.D.   On: 10/17/2017 14:01    AYO:[id number] except as listed in admit H&P  Blood pressure (!) 126/49, pulse 87, temperature 98.9 F (37.2 C), temperature source Oral, resp. rate 16, height 5' (1.524 m), weight 49 kg (108 lb), SpO2 100 %.  PHYSICAL EXAM: Overall appearance:  Healthy appearing, in no distress Head:  Normocephalic, atraumatic. Ears: External auditory canals are clear; tympanic membranes are intact in the middle ears are free of any effusion. Nose: External nose is healthy in appearance.  Internal nasal exam free of any lesions or obstruction. Oral Cavity/Pharynx: There are some purulent secretions in the right submandibular duct puncta, with what appears to be a small palpable stone in the distal duct.  There is no edema of the floor of mouth.  The tongue and pharynx are normal. Larynx/Hypopharynx: Deferred Neuro:  No identifiable neurologic deficits. Neck: Swelling and tenderness of the right submandibular gland.  No skin erythema, no fluctuance.  No other masses identified.  Studies Reviewed: CT of neck.  Procedures: Right submandibular stone extraction.  2% Xylocaine with epinephrine was infiltrated into the right anterior floor of mouth.  The puncta was grasped with forceps and excised.  Purulent secretions were produced.  I was able to palpate and grasped the calculus from externally and make a small mucosal incision with scissors to expose the stone.  The stone was removed.  It was elongated in shape.  There were no other stones palpable.  Massaging the gland produced significant amount of purulent secretions which were suctioned away.  She tolerated this well.   Assessment/Plan: Right submandibular sialolithiasis, treated with sialolithotomy.  She will continue on clindamycin for 5 days.  She should rinse her mouth with water several times daily.  She may massage the gland several times daily.  She may suck on sour lemon wedges or sour candy as needed.  Follow-up in the office if she has any additional problems.  Izora Gala 10/17/2017, 5:21 PM

## 2017-10-17 NOTE — ED Notes (Signed)
Pt has been NPO, requesting pain medication.

## 2017-10-17 NOTE — Discharge Instructions (Signed)
You had a stone in your submandibular duct.  This was removed by the ear nose and throat doctor.  Please take antibiotic 3 times a day for the next 5 days.  I have written you a prescription for pain medicine.  Remember that this medicine can make you drowsy so please do not drive or work while taking it.  Please call and schedule an appointment with the ENT doctor if you have any new or worsening symptoms like fever, increased swelling. I have listed the information to his office below and highlighted it for you.  Return to the ER if you have any new or concerning symptoms.   Rinse your mouth with water several times daily.  Massage the gland on the side of the neck several times daily.  If you are able to suck on lemon wedges or sour candy a few times daily.

## 2017-10-17 NOTE — ED Notes (Signed)
Pt with mass to the right neck, possible cyst. She reports that it appeared all of a sudden, and it is very painful to swallow her saliva and to spit it out. The area is very painful to touch. She denies fever, n/v/d. Suction has been set up.

## 2017-10-18 DIAGNOSIS — N186 End stage renal disease: Secondary | ICD-10-CM | POA: Diagnosis not present

## 2017-10-18 DIAGNOSIS — N2581 Secondary hyperparathyroidism of renal origin: Secondary | ICD-10-CM | POA: Diagnosis not present

## 2017-10-19 DIAGNOSIS — N186 End stage renal disease: Secondary | ICD-10-CM | POA: Diagnosis not present

## 2017-10-19 DIAGNOSIS — N2581 Secondary hyperparathyroidism of renal origin: Secondary | ICD-10-CM | POA: Diagnosis not present

## 2017-10-20 DIAGNOSIS — N186 End stage renal disease: Secondary | ICD-10-CM | POA: Diagnosis not present

## 2017-10-20 DIAGNOSIS — N2581 Secondary hyperparathyroidism of renal origin: Secondary | ICD-10-CM | POA: Diagnosis not present

## 2017-10-21 DIAGNOSIS — N186 End stage renal disease: Secondary | ICD-10-CM | POA: Diagnosis not present

## 2017-10-21 DIAGNOSIS — N2581 Secondary hyperparathyroidism of renal origin: Secondary | ICD-10-CM | POA: Diagnosis not present

## 2017-10-22 DIAGNOSIS — N186 End stage renal disease: Secondary | ICD-10-CM | POA: Diagnosis not present

## 2017-10-22 DIAGNOSIS — N2581 Secondary hyperparathyroidism of renal origin: Secondary | ICD-10-CM | POA: Diagnosis not present

## 2017-10-23 DIAGNOSIS — N2581 Secondary hyperparathyroidism of renal origin: Secondary | ICD-10-CM | POA: Diagnosis not present

## 2017-10-23 DIAGNOSIS — N186 End stage renal disease: Secondary | ICD-10-CM | POA: Diagnosis not present

## 2017-10-24 DIAGNOSIS — N2581 Secondary hyperparathyroidism of renal origin: Secondary | ICD-10-CM | POA: Diagnosis not present

## 2017-10-24 DIAGNOSIS — N186 End stage renal disease: Secondary | ICD-10-CM | POA: Diagnosis not present

## 2017-10-25 DIAGNOSIS — N2581 Secondary hyperparathyroidism of renal origin: Secondary | ICD-10-CM | POA: Diagnosis not present

## 2017-10-25 DIAGNOSIS — N186 End stage renal disease: Secondary | ICD-10-CM | POA: Diagnosis not present

## 2017-10-26 DIAGNOSIS — N2581 Secondary hyperparathyroidism of renal origin: Secondary | ICD-10-CM | POA: Diagnosis not present

## 2017-10-26 DIAGNOSIS — N186 End stage renal disease: Secondary | ICD-10-CM | POA: Diagnosis not present

## 2017-10-27 DIAGNOSIS — N2581 Secondary hyperparathyroidism of renal origin: Secondary | ICD-10-CM | POA: Diagnosis not present

## 2017-10-27 DIAGNOSIS — N186 End stage renal disease: Secondary | ICD-10-CM | POA: Diagnosis not present

## 2017-10-28 DIAGNOSIS — N2581 Secondary hyperparathyroidism of renal origin: Secondary | ICD-10-CM | POA: Diagnosis not present

## 2017-10-28 DIAGNOSIS — N186 End stage renal disease: Secondary | ICD-10-CM | POA: Diagnosis not present

## 2017-10-29 DIAGNOSIS — N2581 Secondary hyperparathyroidism of renal origin: Secondary | ICD-10-CM | POA: Diagnosis not present

## 2017-10-29 DIAGNOSIS — N186 End stage renal disease: Secondary | ICD-10-CM | POA: Diagnosis not present

## 2017-10-30 DIAGNOSIS — N186 End stage renal disease: Secondary | ICD-10-CM | POA: Diagnosis not present

## 2017-10-30 DIAGNOSIS — N2581 Secondary hyperparathyroidism of renal origin: Secondary | ICD-10-CM | POA: Diagnosis not present

## 2017-10-31 DIAGNOSIS — N2581 Secondary hyperparathyroidism of renal origin: Secondary | ICD-10-CM | POA: Diagnosis not present

## 2017-10-31 DIAGNOSIS — N186 End stage renal disease: Secondary | ICD-10-CM | POA: Diagnosis not present

## 2017-11-01 DIAGNOSIS — N2581 Secondary hyperparathyroidism of renal origin: Secondary | ICD-10-CM | POA: Diagnosis not present

## 2017-11-01 DIAGNOSIS — N186 End stage renal disease: Secondary | ICD-10-CM | POA: Diagnosis not present

## 2017-11-02 DIAGNOSIS — N2581 Secondary hyperparathyroidism of renal origin: Secondary | ICD-10-CM | POA: Diagnosis not present

## 2017-11-02 DIAGNOSIS — N186 End stage renal disease: Secondary | ICD-10-CM | POA: Diagnosis not present

## 2017-11-03 DIAGNOSIS — N2581 Secondary hyperparathyroidism of renal origin: Secondary | ICD-10-CM | POA: Diagnosis not present

## 2017-11-03 DIAGNOSIS — N186 End stage renal disease: Secondary | ICD-10-CM | POA: Diagnosis not present

## 2017-11-04 DIAGNOSIS — N186 End stage renal disease: Secondary | ICD-10-CM | POA: Diagnosis not present

## 2017-11-04 DIAGNOSIS — N2581 Secondary hyperparathyroidism of renal origin: Secondary | ICD-10-CM | POA: Diagnosis not present

## 2017-11-05 DIAGNOSIS — N186 End stage renal disease: Secondary | ICD-10-CM | POA: Diagnosis not present

## 2017-11-05 DIAGNOSIS — I150 Renovascular hypertension: Secondary | ICD-10-CM | POA: Diagnosis not present

## 2017-11-05 DIAGNOSIS — Z992 Dependence on renal dialysis: Secondary | ICD-10-CM | POA: Diagnosis not present

## 2017-11-05 DIAGNOSIS — N2581 Secondary hyperparathyroidism of renal origin: Secondary | ICD-10-CM | POA: Diagnosis not present

## 2017-11-06 DIAGNOSIS — N186 End stage renal disease: Secondary | ICD-10-CM | POA: Diagnosis not present

## 2017-11-06 DIAGNOSIS — N2581 Secondary hyperparathyroidism of renal origin: Secondary | ICD-10-CM | POA: Diagnosis not present

## 2017-11-07 DIAGNOSIS — N186 End stage renal disease: Secondary | ICD-10-CM | POA: Diagnosis not present

## 2017-11-07 DIAGNOSIS — N2581 Secondary hyperparathyroidism of renal origin: Secondary | ICD-10-CM | POA: Diagnosis not present

## 2017-11-08 DIAGNOSIS — N186 End stage renal disease: Secondary | ICD-10-CM | POA: Diagnosis not present

## 2017-11-08 DIAGNOSIS — N2581 Secondary hyperparathyroidism of renal origin: Secondary | ICD-10-CM | POA: Diagnosis not present

## 2017-11-09 ENCOUNTER — Encounter (HOSPITAL_COMMUNITY): Payer: Self-pay

## 2017-11-09 ENCOUNTER — Ambulatory Visit (HOSPITAL_COMMUNITY)
Admission: EM | Admit: 2017-11-09 | Discharge: 2017-11-09 | Disposition: A | Discharge status: Home / Self Care | Payer: Medicare HMO | Attending: Family Medicine | Admitting: Family Medicine

## 2017-11-09 DIAGNOSIS — B029 Zoster without complications: Secondary | ICD-10-CM

## 2017-11-09 DIAGNOSIS — R519 Headache, unspecified: Secondary | ICD-10-CM

## 2017-11-09 DIAGNOSIS — N2581 Secondary hyperparathyroidism of renal origin: Secondary | ICD-10-CM | POA: Diagnosis not present

## 2017-11-09 DIAGNOSIS — R51 Headache: Secondary | ICD-10-CM

## 2017-11-09 DIAGNOSIS — R21 Rash and other nonspecific skin eruption: Secondary | ICD-10-CM | POA: Diagnosis not present

## 2017-11-09 DIAGNOSIS — N186 End stage renal disease: Secondary | ICD-10-CM | POA: Diagnosis not present

## 2017-11-09 MED ORDER — VALACYCLOVIR HCL 500 MG PO TABS: 500.0000 mg | ORAL_TABLET | Freq: Every day | ORAL | 0 refills | Status: DC

## 2017-11-09 MED ORDER — HYDROCODONE-ACETAMINOPHEN 5-325 MG PO TABS: 1.0000 | ORAL_TABLET | Freq: Three times a day (TID) | ORAL | 0 refills | Status: DC | PRN

## 2017-11-09 NOTE — Discharge Instructions (Addendum)
Please check back with your dialysis doctor and your PCP so that they know you were started on Valtrex and hydrocodone for shingles.

## 2017-11-09 NOTE — ED Triage Notes (Signed)
Pt presents with possible insect bite on the left side of her face.

## 2017-11-09 NOTE — ED Provider Notes (Signed)
MRN: [id number] DOB: 01-06-1946  Subjective:   [name] is a 72 y.o. female presenting for 2-day history of left-sided facial rash with pain.  Patient reports that she has a lot of bumps over the left side.  Denies insect bite, working in the yard or coming into contact with poisonous plants.  Denies fever, drainage of pus or bleeding, blisters.  Patient is on dialysis, has retroperitoneal dialysis.  No current facility-administered medications for this encounter.   Current Outpatient Medications:  .  amLODipine (NORVASC) 5 MG tablet, Take 5 mg by mouth daily., Disp: , Rfl:  .  aspirin EC 81 MG tablet, Take 81 mg by mouth daily., Disp: , Rfl:  .  atorvastatin (LIPITOR) 10 MG tablet, Take 1 tablet (10 mg total) by mouth daily., Disp: 90 tablet, Rfl: 3 .  bisacodyl (DULCOLAX) 10 MG suppository, Place 1 suppository (10 mg total) rectally daily as needed for moderate constipation., Disp: 12 suppository, Rfl: 0 .  Multiple Vitamin (MULTIVITAMIN) tablet, Take 1 tablet by mouth daily., Disp: , Rfl:  .  pantoprazole (PROTONIX) 40 MG tablet, Take 1 tablet (40 mg total) by mouth daily., Disp: 90 tablet, Rfl: 3 .  polyethylene glycol (MIRALAX / GLYCOLAX) packet, Take 17 g by mouth daily., Disp: 14 each, Rfl: 0 .  sodium chloride (OCEAN) 0.65 % SOLN nasal spray, Place 2 sprays into both nostrils as needed. , Disp: , Rfl:     Allergies  Allergen Reactions  . Ace Inhibitors Anaphylaxis    REACTION: angioedema  . Other Anaphylaxis    Calcium Channel Blocking Agent Diltiazem Analogues  . Pneumococcal Vaccines Other (See Comments)    Red rash, Knot (sore and itches), A fever for a couple days (per pt report)  . Tetanus Toxoid Other (See Comments)    Cellulitis in arm    Past Medical History:  Diagnosis Date  . Acute kidney injury ([place]) 07/20/2016  . Allergic rhinitis   . Anxiety   . CAP (community acquired pneumonia) 06/2016   [name] 06/19/2016  . Cervical disc disease    Archie Endo  07/20/2016  . Chest pain at rest 06/18/2012  . COPD (chronic obstructive pulmonary disease) ([place]) 05/23/2012  . Diverticulosis   . DJD (degenerative joint disease)   . Esophageal diverticulum   . Esophageal stricture   . GERD (gastroesophageal reflux disease)   . Hiatal hernia   . History of blood transfusion 06/2016   "when I was hospitalized w/sepsis"  . Hyperlipidemia 05/23/2012   T. Chol 234, LDL 130   . Hypertension   . IBS (irritable bowel syndrome)   . Lung nodules    hx  with extensive workup including lung biopsy ruling out Sjogren's disease/notes 07/20/2016  . Osteopenia   . Recurrent sinusitis    [name] 07/20/2016  . Renal insufficiency   . Sinus headache    "daily lately" (07/20/2016)  . Traumatic arthritis      Past Surgical History:  Procedure Laterality Date  . ANTERIOR CERVICAL DECOMP/DISCECTOMY FUSION  2005  . BACK SURGERY    . COLONOSCOPY  2001   Archie Endo 09/21/2010  . CYSTECTOMY Bilateral    hx/notes 09/21/2010  . IR GENERIC HISTORICAL  07/20/2016   IR FLUORO GUIDE CV LINE RIGHT 07/20/2016 [name], MD [facility]  . IR GENERIC HISTORICAL  07/20/2016   IR US GUIDE VASC ACCESS RIGHT 07/20/2016 [name], MD [facility]  . LUNG BIOPSY Left    left side, not cancerous, thoracotomy  . TONSILLECTOMY  AND ADENOIDECTOMY     hx/notes 09/21/2010  . VAGINAL HYSTERECTOMY     hx w/oophorectomy/notes 09/21/2010    Objective:   Vitals: BP 126/81 (BP Location: Left Arm)   Pulse 75   Temp 99.2 F (37.3 C) (Oral)   Resp 20   SpO2 100%   Physical Exam  Constitutional: She is oriented to person, place, and time. She appears well-developed and well-nourished.  HENT:  Head:    Right Ear: Tympanic membrane normal.  Left Ear: Tympanic membrane normal.  Mouth/Throat: Oropharynx is clear and moist.  Eyes: Pupils are equal, round, and reactive to light. EOM are normal. Right eye exhibits no discharge. Left eye exhibits no discharge.  Cardiovascular: Normal rate.    Pulmonary/Chest: Effort normal.  Neurological: She is alert and oriented to person, place, and time.  Skin: Skin is warm and dry.  Psychiatric: She has a normal mood and affect.   Assessment and Plan :   Facial rash  Facial pain  Herpes zoster without complication  Start Valtrex 500mg  once daily for 7-10 days. Use hydrocodone for pain, once every 8 hours as needed. Counseled patient on potential for adverse effects with medications prescribed today, patient verbalized understanding.    Jaynee Eagles, PA-C 11/09/17 1037

## 2017-11-10 DIAGNOSIS — N2581 Secondary hyperparathyroidism of renal origin: Secondary | ICD-10-CM | POA: Diagnosis not present

## 2017-11-10 DIAGNOSIS — N186 End stage renal disease: Secondary | ICD-10-CM | POA: Diagnosis not present

## 2017-11-11 DIAGNOSIS — N186 End stage renal disease: Secondary | ICD-10-CM | POA: Diagnosis not present

## 2017-11-11 DIAGNOSIS — N2581 Secondary hyperparathyroidism of renal origin: Secondary | ICD-10-CM | POA: Diagnosis not present

## 2017-11-12 DIAGNOSIS — N2581 Secondary hyperparathyroidism of renal origin: Secondary | ICD-10-CM | POA: Diagnosis not present

## 2017-11-12 DIAGNOSIS — N186 End stage renal disease: Secondary | ICD-10-CM | POA: Diagnosis not present

## 2017-11-13 DIAGNOSIS — N186 End stage renal disease: Secondary | ICD-10-CM | POA: Diagnosis not present

## 2017-11-13 DIAGNOSIS — N2581 Secondary hyperparathyroidism of renal origin: Secondary | ICD-10-CM | POA: Diagnosis not present

## 2017-11-14 DIAGNOSIS — N186 End stage renal disease: Secondary | ICD-10-CM | POA: Diagnosis not present

## 2017-11-14 DIAGNOSIS — N2581 Secondary hyperparathyroidism of renal origin: Secondary | ICD-10-CM | POA: Diagnosis not present

## 2017-11-15 DIAGNOSIS — N2581 Secondary hyperparathyroidism of renal origin: Secondary | ICD-10-CM | POA: Diagnosis not present

## 2017-11-15 DIAGNOSIS — N186 End stage renal disease: Secondary | ICD-10-CM | POA: Diagnosis not present

## 2017-11-16 DIAGNOSIS — N186 End stage renal disease: Secondary | ICD-10-CM | POA: Diagnosis not present

## 2017-11-16 DIAGNOSIS — N2581 Secondary hyperparathyroidism of renal origin: Secondary | ICD-10-CM | POA: Diagnosis not present

## 2017-11-17 DIAGNOSIS — N186 End stage renal disease: Secondary | ICD-10-CM | POA: Diagnosis not present

## 2017-11-17 DIAGNOSIS — N2581 Secondary hyperparathyroidism of renal origin: Secondary | ICD-10-CM | POA: Diagnosis not present

## 2017-11-18 DIAGNOSIS — N186 End stage renal disease: Secondary | ICD-10-CM | POA: Diagnosis not present

## 2017-11-18 DIAGNOSIS — N2581 Secondary hyperparathyroidism of renal origin: Secondary | ICD-10-CM | POA: Diagnosis not present

## 2017-11-19 DIAGNOSIS — N2581 Secondary hyperparathyroidism of renal origin: Secondary | ICD-10-CM | POA: Diagnosis not present

## 2017-11-19 DIAGNOSIS — N186 End stage renal disease: Secondary | ICD-10-CM | POA: Diagnosis not present

## 2017-11-20 DIAGNOSIS — N2581 Secondary hyperparathyroidism of renal origin: Secondary | ICD-10-CM | POA: Diagnosis not present

## 2017-11-20 DIAGNOSIS — N186 End stage renal disease: Secondary | ICD-10-CM | POA: Diagnosis not present

## 2017-11-21 DIAGNOSIS — B028 Zoster with other complications: Secondary | ICD-10-CM | POA: Diagnosis not present

## 2017-11-21 DIAGNOSIS — Z992 Dependence on renal dialysis: Secondary | ICD-10-CM | POA: Diagnosis not present

## 2017-11-21 DIAGNOSIS — N186 End stage renal disease: Secondary | ICD-10-CM | POA: Diagnosis not present

## 2017-11-21 DIAGNOSIS — N2581 Secondary hyperparathyroidism of renal origin: Secondary | ICD-10-CM | POA: Diagnosis not present

## 2017-11-21 DIAGNOSIS — B0222 Postherpetic trigeminal neuralgia: Secondary | ICD-10-CM | POA: Diagnosis not present

## 2017-11-22 DIAGNOSIS — N2581 Secondary hyperparathyroidism of renal origin: Secondary | ICD-10-CM | POA: Diagnosis not present

## 2017-11-22 DIAGNOSIS — N186 End stage renal disease: Secondary | ICD-10-CM | POA: Diagnosis not present

## 2017-11-23 DIAGNOSIS — N186 End stage renal disease: Secondary | ICD-10-CM | POA: Diagnosis not present

## 2017-11-23 DIAGNOSIS — N2581 Secondary hyperparathyroidism of renal origin: Secondary | ICD-10-CM | POA: Diagnosis not present

## 2017-11-24 DIAGNOSIS — N186 End stage renal disease: Secondary | ICD-10-CM | POA: Diagnosis not present

## 2017-11-24 DIAGNOSIS — N2581 Secondary hyperparathyroidism of renal origin: Secondary | ICD-10-CM | POA: Diagnosis not present

## 2017-11-25 DIAGNOSIS — N186 End stage renal disease: Secondary | ICD-10-CM | POA: Diagnosis not present

## 2017-11-25 DIAGNOSIS — N2581 Secondary hyperparathyroidism of renal origin: Secondary | ICD-10-CM | POA: Diagnosis not present

## 2017-11-26 DIAGNOSIS — N2581 Secondary hyperparathyroidism of renal origin: Secondary | ICD-10-CM | POA: Diagnosis not present

## 2017-11-26 DIAGNOSIS — N186 End stage renal disease: Secondary | ICD-10-CM | POA: Diagnosis not present

## 2017-11-27 DIAGNOSIS — N186 End stage renal disease: Secondary | ICD-10-CM | POA: Diagnosis not present

## 2017-11-27 DIAGNOSIS — N2581 Secondary hyperparathyroidism of renal origin: Secondary | ICD-10-CM | POA: Diagnosis not present

## 2017-11-28 DIAGNOSIS — N186 End stage renal disease: Secondary | ICD-10-CM | POA: Diagnosis not present

## 2017-11-28 DIAGNOSIS — N2581 Secondary hyperparathyroidism of renal origin: Secondary | ICD-10-CM | POA: Diagnosis not present

## 2017-11-29 DIAGNOSIS — N186 End stage renal disease: Secondary | ICD-10-CM | POA: Diagnosis not present

## 2017-11-29 DIAGNOSIS — N2581 Secondary hyperparathyroidism of renal origin: Secondary | ICD-10-CM | POA: Diagnosis not present

## 2017-11-30 DIAGNOSIS — N186 End stage renal disease: Secondary | ICD-10-CM | POA: Diagnosis not present

## 2017-11-30 DIAGNOSIS — N2581 Secondary hyperparathyroidism of renal origin: Secondary | ICD-10-CM | POA: Diagnosis not present

## 2017-12-01 DIAGNOSIS — N186 End stage renal disease: Secondary | ICD-10-CM | POA: Diagnosis not present

## 2017-12-01 DIAGNOSIS — N2581 Secondary hyperparathyroidism of renal origin: Secondary | ICD-10-CM | POA: Diagnosis not present

## 2017-12-02 DIAGNOSIS — N186 End stage renal disease: Secondary | ICD-10-CM | POA: Diagnosis not present

## 2017-12-02 DIAGNOSIS — N2581 Secondary hyperparathyroidism of renal origin: Secondary | ICD-10-CM | POA: Diagnosis not present

## 2017-12-03 DIAGNOSIS — N2581 Secondary hyperparathyroidism of renal origin: Secondary | ICD-10-CM | POA: Diagnosis not present

## 2017-12-03 DIAGNOSIS — N186 End stage renal disease: Secondary | ICD-10-CM | POA: Diagnosis not present

## 2017-12-04 DIAGNOSIS — N2581 Secondary hyperparathyroidism of renal origin: Secondary | ICD-10-CM | POA: Diagnosis not present

## 2017-12-04 DIAGNOSIS — N186 End stage renal disease: Secondary | ICD-10-CM | POA: Diagnosis not present

## 2017-12-05 DIAGNOSIS — N2581 Secondary hyperparathyroidism of renal origin: Secondary | ICD-10-CM | POA: Diagnosis not present

## 2017-12-05 DIAGNOSIS — N186 End stage renal disease: Secondary | ICD-10-CM | POA: Diagnosis not present

## 2017-12-06 DIAGNOSIS — I150 Renovascular hypertension: Secondary | ICD-10-CM | POA: Diagnosis not present

## 2017-12-06 DIAGNOSIS — Z992 Dependence on renal dialysis: Secondary | ICD-10-CM | POA: Diagnosis not present

## 2017-12-06 DIAGNOSIS — N2581 Secondary hyperparathyroidism of renal origin: Secondary | ICD-10-CM | POA: Diagnosis not present

## 2017-12-06 DIAGNOSIS — N186 End stage renal disease: Secondary | ICD-10-CM | POA: Diagnosis not present

## 2017-12-07 DIAGNOSIS — N186 End stage renal disease: Secondary | ICD-10-CM | POA: Diagnosis not present

## 2017-12-07 DIAGNOSIS — N2581 Secondary hyperparathyroidism of renal origin: Secondary | ICD-10-CM | POA: Diagnosis not present

## 2017-12-08 DIAGNOSIS — N2581 Secondary hyperparathyroidism of renal origin: Secondary | ICD-10-CM | POA: Diagnosis not present

## 2017-12-08 DIAGNOSIS — N186 End stage renal disease: Secondary | ICD-10-CM | POA: Diagnosis not present

## 2017-12-09 DIAGNOSIS — N2581 Secondary hyperparathyroidism of renal origin: Secondary | ICD-10-CM | POA: Diagnosis not present

## 2017-12-09 DIAGNOSIS — N186 End stage renal disease: Secondary | ICD-10-CM | POA: Diagnosis not present

## 2017-12-10 DIAGNOSIS — N186 End stage renal disease: Secondary | ICD-10-CM | POA: Diagnosis not present

## 2017-12-10 DIAGNOSIS — N2581 Secondary hyperparathyroidism of renal origin: Secondary | ICD-10-CM | POA: Diagnosis not present

## 2017-12-11 DIAGNOSIS — N186 End stage renal disease: Secondary | ICD-10-CM | POA: Diagnosis not present

## 2017-12-11 DIAGNOSIS — N2581 Secondary hyperparathyroidism of renal origin: Secondary | ICD-10-CM | POA: Diagnosis not present

## 2017-12-12 DIAGNOSIS — B0222 Postherpetic trigeminal neuralgia: Secondary | ICD-10-CM | POA: Diagnosis not present

## 2017-12-12 DIAGNOSIS — H9202 Otalgia, left ear: Secondary | ICD-10-CM | POA: Diagnosis not present

## 2017-12-12 DIAGNOSIS — N186 End stage renal disease: Secondary | ICD-10-CM | POA: Diagnosis not present

## 2017-12-12 DIAGNOSIS — N2581 Secondary hyperparathyroidism of renal origin: Secondary | ICD-10-CM | POA: Diagnosis not present

## 2017-12-13 DIAGNOSIS — N2581 Secondary hyperparathyroidism of renal origin: Secondary | ICD-10-CM | POA: Diagnosis not present

## 2017-12-13 DIAGNOSIS — N186 End stage renal disease: Secondary | ICD-10-CM | POA: Diagnosis not present

## 2017-12-14 DIAGNOSIS — N2581 Secondary hyperparathyroidism of renal origin: Secondary | ICD-10-CM | POA: Diagnosis not present

## 2017-12-14 DIAGNOSIS — N186 End stage renal disease: Secondary | ICD-10-CM | POA: Diagnosis not present

## 2017-12-15 DIAGNOSIS — N2581 Secondary hyperparathyroidism of renal origin: Secondary | ICD-10-CM | POA: Diagnosis not present

## 2017-12-15 DIAGNOSIS — N186 End stage renal disease: Secondary | ICD-10-CM | POA: Diagnosis not present

## 2017-12-16 DIAGNOSIS — N2581 Secondary hyperparathyroidism of renal origin: Secondary | ICD-10-CM | POA: Diagnosis not present

## 2017-12-16 DIAGNOSIS — N186 End stage renal disease: Secondary | ICD-10-CM | POA: Diagnosis not present

## 2017-12-17 DIAGNOSIS — N2581 Secondary hyperparathyroidism of renal origin: Secondary | ICD-10-CM | POA: Diagnosis not present

## 2017-12-17 DIAGNOSIS — N186 End stage renal disease: Secondary | ICD-10-CM | POA: Diagnosis not present

## 2017-12-18 DIAGNOSIS — N2581 Secondary hyperparathyroidism of renal origin: Secondary | ICD-10-CM | POA: Diagnosis not present

## 2017-12-18 DIAGNOSIS — N186 End stage renal disease: Secondary | ICD-10-CM | POA: Diagnosis not present

## 2017-12-19 DIAGNOSIS — N2581 Secondary hyperparathyroidism of renal origin: Secondary | ICD-10-CM | POA: Diagnosis not present

## 2017-12-19 DIAGNOSIS — N186 End stage renal disease: Secondary | ICD-10-CM | POA: Diagnosis not present

## 2017-12-20 DIAGNOSIS — N186 End stage renal disease: Secondary | ICD-10-CM | POA: Diagnosis not present

## 2017-12-20 DIAGNOSIS — N2581 Secondary hyperparathyroidism of renal origin: Secondary | ICD-10-CM | POA: Diagnosis not present

## 2017-12-21 DIAGNOSIS — N2581 Secondary hyperparathyroidism of renal origin: Secondary | ICD-10-CM | POA: Diagnosis not present

## 2017-12-21 DIAGNOSIS — N186 End stage renal disease: Secondary | ICD-10-CM | POA: Diagnosis not present

## 2017-12-22 DIAGNOSIS — N186 End stage renal disease: Secondary | ICD-10-CM | POA: Diagnosis not present

## 2017-12-22 DIAGNOSIS — N2581 Secondary hyperparathyroidism of renal origin: Secondary | ICD-10-CM | POA: Diagnosis not present

## 2017-12-23 DIAGNOSIS — N186 End stage renal disease: Secondary | ICD-10-CM | POA: Diagnosis not present

## 2017-12-23 DIAGNOSIS — N2581 Secondary hyperparathyroidism of renal origin: Secondary | ICD-10-CM | POA: Diagnosis not present

## 2017-12-24 DIAGNOSIS — N186 End stage renal disease: Secondary | ICD-10-CM | POA: Diagnosis not present

## 2017-12-24 DIAGNOSIS — N2581 Secondary hyperparathyroidism of renal origin: Secondary | ICD-10-CM | POA: Diagnosis not present

## 2017-12-25 DIAGNOSIS — N186 End stage renal disease: Secondary | ICD-10-CM | POA: Diagnosis not present

## 2017-12-25 DIAGNOSIS — N2581 Secondary hyperparathyroidism of renal origin: Secondary | ICD-10-CM | POA: Diagnosis not present

## 2017-12-26 DIAGNOSIS — N2581 Secondary hyperparathyroidism of renal origin: Secondary | ICD-10-CM | POA: Diagnosis not present

## 2017-12-26 DIAGNOSIS — N186 End stage renal disease: Secondary | ICD-10-CM | POA: Diagnosis not present

## 2017-12-27 DIAGNOSIS — N186 End stage renal disease: Secondary | ICD-10-CM | POA: Diagnosis not present

## 2017-12-27 DIAGNOSIS — N2581 Secondary hyperparathyroidism of renal origin: Secondary | ICD-10-CM | POA: Diagnosis not present

## 2017-12-28 DIAGNOSIS — N186 End stage renal disease: Secondary | ICD-10-CM | POA: Diagnosis not present

## 2017-12-28 DIAGNOSIS — N2581 Secondary hyperparathyroidism of renal origin: Secondary | ICD-10-CM | POA: Diagnosis not present

## 2017-12-29 DIAGNOSIS — N186 End stage renal disease: Secondary | ICD-10-CM | POA: Diagnosis not present

## 2017-12-29 DIAGNOSIS — N2581 Secondary hyperparathyroidism of renal origin: Secondary | ICD-10-CM | POA: Diagnosis not present

## 2017-12-30 DIAGNOSIS — N2581 Secondary hyperparathyroidism of renal origin: Secondary | ICD-10-CM | POA: Diagnosis not present

## 2017-12-30 DIAGNOSIS — N186 End stage renal disease: Secondary | ICD-10-CM | POA: Diagnosis not present

## 2017-12-31 DIAGNOSIS — N2581 Secondary hyperparathyroidism of renal origin: Secondary | ICD-10-CM | POA: Diagnosis not present

## 2017-12-31 DIAGNOSIS — N186 End stage renal disease: Secondary | ICD-10-CM | POA: Diagnosis not present

## 2018-01-01 DIAGNOSIS — N2581 Secondary hyperparathyroidism of renal origin: Secondary | ICD-10-CM | POA: Diagnosis not present

## 2018-01-01 DIAGNOSIS — N186 End stage renal disease: Secondary | ICD-10-CM | POA: Diagnosis not present

## 2018-01-02 DIAGNOSIS — N2581 Secondary hyperparathyroidism of renal origin: Secondary | ICD-10-CM | POA: Diagnosis not present

## 2018-01-02 DIAGNOSIS — N186 End stage renal disease: Secondary | ICD-10-CM | POA: Diagnosis not present

## 2018-01-03 DIAGNOSIS — N186 End stage renal disease: Secondary | ICD-10-CM | POA: Diagnosis not present

## 2018-01-03 DIAGNOSIS — N2581 Secondary hyperparathyroidism of renal origin: Secondary | ICD-10-CM | POA: Diagnosis not present

## 2018-01-04 DIAGNOSIS — N186 End stage renal disease: Secondary | ICD-10-CM | POA: Diagnosis not present

## 2018-01-04 DIAGNOSIS — N2581 Secondary hyperparathyroidism of renal origin: Secondary | ICD-10-CM | POA: Diagnosis not present

## 2018-01-05 DIAGNOSIS — N2581 Secondary hyperparathyroidism of renal origin: Secondary | ICD-10-CM | POA: Diagnosis not present

## 2018-01-05 DIAGNOSIS — N186 End stage renal disease: Secondary | ICD-10-CM | POA: Diagnosis not present

## 2018-01-06 DIAGNOSIS — N186 End stage renal disease: Secondary | ICD-10-CM | POA: Diagnosis not present

## 2018-01-06 DIAGNOSIS — N2581 Secondary hyperparathyroidism of renal origin: Secondary | ICD-10-CM | POA: Diagnosis not present

## 2018-01-06 DIAGNOSIS — Z992 Dependence on renal dialysis: Secondary | ICD-10-CM | POA: Diagnosis not present

## 2018-01-06 DIAGNOSIS — I150 Renovascular hypertension: Secondary | ICD-10-CM | POA: Diagnosis not present

## 2018-01-07 DIAGNOSIS — N2581 Secondary hyperparathyroidism of renal origin: Secondary | ICD-10-CM | POA: Diagnosis not present

## 2018-01-07 DIAGNOSIS — N186 End stage renal disease: Secondary | ICD-10-CM | POA: Diagnosis not present

## 2018-01-08 DIAGNOSIS — N186 End stage renal disease: Secondary | ICD-10-CM | POA: Diagnosis not present

## 2018-01-08 DIAGNOSIS — N2581 Secondary hyperparathyroidism of renal origin: Secondary | ICD-10-CM | POA: Diagnosis not present

## 2018-01-09 DIAGNOSIS — N2581 Secondary hyperparathyroidism of renal origin: Secondary | ICD-10-CM | POA: Diagnosis not present

## 2018-01-09 DIAGNOSIS — N186 End stage renal disease: Secondary | ICD-10-CM | POA: Diagnosis not present

## 2018-01-10 DIAGNOSIS — N2581 Secondary hyperparathyroidism of renal origin: Secondary | ICD-10-CM | POA: Diagnosis not present

## 2018-01-10 DIAGNOSIS — N186 End stage renal disease: Secondary | ICD-10-CM | POA: Diagnosis not present

## 2018-01-11 DIAGNOSIS — N2581 Secondary hyperparathyroidism of renal origin: Secondary | ICD-10-CM | POA: Diagnosis not present

## 2018-01-11 DIAGNOSIS — N186 End stage renal disease: Secondary | ICD-10-CM | POA: Diagnosis not present

## 2018-01-12 DIAGNOSIS — N186 End stage renal disease: Secondary | ICD-10-CM | POA: Diagnosis not present

## 2018-01-12 DIAGNOSIS — N2581 Secondary hyperparathyroidism of renal origin: Secondary | ICD-10-CM | POA: Diagnosis not present

## 2018-01-13 DIAGNOSIS — N2581 Secondary hyperparathyroidism of renal origin: Secondary | ICD-10-CM | POA: Diagnosis not present

## 2018-01-13 DIAGNOSIS — N186 End stage renal disease: Secondary | ICD-10-CM | POA: Diagnosis not present

## 2018-01-14 DIAGNOSIS — N186 End stage renal disease: Secondary | ICD-10-CM | POA: Diagnosis not present

## 2018-01-14 DIAGNOSIS — N2581 Secondary hyperparathyroidism of renal origin: Secondary | ICD-10-CM | POA: Diagnosis not present

## 2018-01-15 DIAGNOSIS — N2581 Secondary hyperparathyroidism of renal origin: Secondary | ICD-10-CM | POA: Diagnosis not present

## 2018-01-15 DIAGNOSIS — N186 End stage renal disease: Secondary | ICD-10-CM | POA: Diagnosis not present

## 2018-01-16 DIAGNOSIS — N2581 Secondary hyperparathyroidism of renal origin: Secondary | ICD-10-CM | POA: Diagnosis not present

## 2018-01-16 DIAGNOSIS — N186 End stage renal disease: Secondary | ICD-10-CM | POA: Diagnosis not present

## 2018-01-17 DIAGNOSIS — N186 End stage renal disease: Secondary | ICD-10-CM | POA: Diagnosis not present

## 2018-01-17 DIAGNOSIS — N2581 Secondary hyperparathyroidism of renal origin: Secondary | ICD-10-CM | POA: Diagnosis not present

## 2018-01-18 DIAGNOSIS — N2581 Secondary hyperparathyroidism of renal origin: Secondary | ICD-10-CM | POA: Diagnosis not present

## 2018-01-18 DIAGNOSIS — N186 End stage renal disease: Secondary | ICD-10-CM | POA: Diagnosis not present

## 2018-01-19 DIAGNOSIS — N186 End stage renal disease: Secondary | ICD-10-CM | POA: Diagnosis not present

## 2018-01-19 DIAGNOSIS — N2581 Secondary hyperparathyroidism of renal origin: Secondary | ICD-10-CM | POA: Diagnosis not present

## 2018-01-20 DIAGNOSIS — N2581 Secondary hyperparathyroidism of renal origin: Secondary | ICD-10-CM | POA: Diagnosis not present

## 2018-01-20 DIAGNOSIS — N186 End stage renal disease: Secondary | ICD-10-CM | POA: Diagnosis not present

## 2018-01-21 DIAGNOSIS — N2581 Secondary hyperparathyroidism of renal origin: Secondary | ICD-10-CM | POA: Diagnosis not present

## 2018-01-21 DIAGNOSIS — N186 End stage renal disease: Secondary | ICD-10-CM | POA: Diagnosis not present

## 2018-01-22 DIAGNOSIS — N2581 Secondary hyperparathyroidism of renal origin: Secondary | ICD-10-CM | POA: Diagnosis not present

## 2018-01-22 DIAGNOSIS — N186 End stage renal disease: Secondary | ICD-10-CM | POA: Diagnosis not present

## 2018-01-23 DIAGNOSIS — N2581 Secondary hyperparathyroidism of renal origin: Secondary | ICD-10-CM | POA: Diagnosis not present

## 2018-01-23 DIAGNOSIS — N186 End stage renal disease: Secondary | ICD-10-CM | POA: Diagnosis not present

## 2018-01-24 DIAGNOSIS — N186 End stage renal disease: Secondary | ICD-10-CM | POA: Diagnosis not present

## 2018-01-24 DIAGNOSIS — N2581 Secondary hyperparathyroidism of renal origin: Secondary | ICD-10-CM | POA: Diagnosis not present

## 2018-01-25 DIAGNOSIS — N186 End stage renal disease: Secondary | ICD-10-CM | POA: Diagnosis not present

## 2018-01-25 DIAGNOSIS — N2581 Secondary hyperparathyroidism of renal origin: Secondary | ICD-10-CM | POA: Diagnosis not present

## 2018-01-26 DIAGNOSIS — N186 End stage renal disease: Secondary | ICD-10-CM | POA: Diagnosis not present

## 2018-01-26 DIAGNOSIS — N2581 Secondary hyperparathyroidism of renal origin: Secondary | ICD-10-CM | POA: Diagnosis not present

## 2018-01-27 DIAGNOSIS — N2581 Secondary hyperparathyroidism of renal origin: Secondary | ICD-10-CM | POA: Diagnosis not present

## 2018-01-27 DIAGNOSIS — N186 End stage renal disease: Secondary | ICD-10-CM | POA: Diagnosis not present

## 2018-01-28 DIAGNOSIS — N186 End stage renal disease: Secondary | ICD-10-CM | POA: Diagnosis not present

## 2018-01-28 DIAGNOSIS — N2581 Secondary hyperparathyroidism of renal origin: Secondary | ICD-10-CM | POA: Diagnosis not present

## 2018-01-29 DIAGNOSIS — N186 End stage renal disease: Secondary | ICD-10-CM | POA: Diagnosis not present

## 2018-01-29 DIAGNOSIS — N2581 Secondary hyperparathyroidism of renal origin: Secondary | ICD-10-CM | POA: Diagnosis not present

## 2018-01-30 DIAGNOSIS — N2581 Secondary hyperparathyroidism of renal origin: Secondary | ICD-10-CM | POA: Diagnosis not present

## 2018-01-30 DIAGNOSIS — N186 End stage renal disease: Secondary | ICD-10-CM | POA: Diagnosis not present

## 2018-01-31 DIAGNOSIS — N186 End stage renal disease: Secondary | ICD-10-CM | POA: Diagnosis not present

## 2018-01-31 DIAGNOSIS — N2581 Secondary hyperparathyroidism of renal origin: Secondary | ICD-10-CM | POA: Diagnosis not present

## 2018-02-01 DIAGNOSIS — N186 End stage renal disease: Secondary | ICD-10-CM | POA: Diagnosis not present

## 2018-02-01 DIAGNOSIS — N2581 Secondary hyperparathyroidism of renal origin: Secondary | ICD-10-CM | POA: Diagnosis not present

## 2018-02-02 DIAGNOSIS — N2581 Secondary hyperparathyroidism of renal origin: Secondary | ICD-10-CM | POA: Diagnosis not present

## 2018-02-02 DIAGNOSIS — N186 End stage renal disease: Secondary | ICD-10-CM | POA: Diagnosis not present

## 2018-02-03 DIAGNOSIS — N2581 Secondary hyperparathyroidism of renal origin: Secondary | ICD-10-CM | POA: Diagnosis not present

## 2018-02-03 DIAGNOSIS — N186 End stage renal disease: Secondary | ICD-10-CM | POA: Diagnosis not present

## 2018-02-04 DIAGNOSIS — N2581 Secondary hyperparathyroidism of renal origin: Secondary | ICD-10-CM | POA: Diagnosis not present

## 2018-02-04 DIAGNOSIS — N186 End stage renal disease: Secondary | ICD-10-CM | POA: Diagnosis not present

## 2018-02-05 ENCOUNTER — Encounter (HOSPITAL_COMMUNITY): Payer: Self-pay | Admitting: Emergency Medicine

## 2018-02-05 ENCOUNTER — Ambulatory Visit
Admission: RE | Admit: 2018-02-05 | Discharge: 2018-02-05 | Disposition: A | Discharge status: Home / Self Care | Payer: Medicare HMO | Source: Ambulatory Visit | Attending: Internal Medicine | Admitting: Internal Medicine

## 2018-02-05 ENCOUNTER — Inpatient Hospital Stay (HOSPITAL_COMMUNITY)
Admission: EM | Admit: 2018-02-05 | Discharge: 2018-02-08 | DRG: 919 | Disposition: A | Discharge status: Home / Self Care | Payer: Medicare HMO | Attending: Family Medicine | Admitting: Family Medicine

## 2018-02-05 ENCOUNTER — Other Ambulatory Visit: Payer: Self-pay | Admitting: Internal Medicine

## 2018-02-05 ENCOUNTER — Other Ambulatory Visit: Payer: Self-pay

## 2018-02-05 DIAGNOSIS — E782 Mixed hyperlipidemia: Secondary | ICD-10-CM | POA: Diagnosis not present

## 2018-02-05 DIAGNOSIS — Z87891 Personal history of nicotine dependence: Secondary | ICD-10-CM

## 2018-02-05 DIAGNOSIS — Z888 Allergy status to other drugs, medicaments and biological substances status: Secondary | ICD-10-CM

## 2018-02-05 DIAGNOSIS — Z91040 Latex allergy status: Secondary | ICD-10-CM | POA: Diagnosis not present

## 2018-02-05 DIAGNOSIS — E876 Hypokalemia: Secondary | ICD-10-CM | POA: Diagnosis not present

## 2018-02-05 DIAGNOSIS — N186 End stage renal disease: Secondary | ICD-10-CM | POA: Diagnosis not present

## 2018-02-05 DIAGNOSIS — R1084 Generalized abdominal pain: Secondary | ICD-10-CM

## 2018-02-05 DIAGNOSIS — I150 Renovascular hypertension: Secondary | ICD-10-CM | POA: Diagnosis not present

## 2018-02-05 DIAGNOSIS — N019 Rapidly progressive nephritic syndrome with unspecified morphologic changes: Secondary | ICD-10-CM | POA: Diagnosis not present

## 2018-02-05 DIAGNOSIS — Z7982 Long term (current) use of aspirin: Secondary | ICD-10-CM

## 2018-02-05 DIAGNOSIS — Z992 Dependence on renal dialysis: Secondary | ICD-10-CM | POA: Diagnosis not present

## 2018-02-05 DIAGNOSIS — I120 Hypertensive chronic kidney disease with stage 5 chronic kidney disease or end stage renal disease: Secondary | ICD-10-CM | POA: Diagnosis present

## 2018-02-05 DIAGNOSIS — R109 Unspecified abdominal pain: Secondary | ICD-10-CM | POA: Diagnosis present

## 2018-02-05 DIAGNOSIS — K8020 Calculus of gallbladder without cholecystitis without obstruction: Secondary | ICD-10-CM | POA: Diagnosis not present

## 2018-02-05 DIAGNOSIS — Z681 Body mass index (BMI) 19 or less, adult: Secondary | ICD-10-CM

## 2018-02-05 DIAGNOSIS — Z981 Arthrodesis status: Secondary | ICD-10-CM | POA: Diagnosis not present

## 2018-02-05 DIAGNOSIS — Y838 Other surgical procedures as the cause of abnormal reaction of the patient, or of later complication, without mention of misadventure at the time of the procedure: Secondary | ICD-10-CM | POA: Diagnosis present

## 2018-02-05 DIAGNOSIS — T8571XA Infection and inflammatory reaction due to peritoneal dialysis catheter, initial encounter: Principal | ICD-10-CM | POA: Diagnosis present

## 2018-02-05 DIAGNOSIS — I776 Arteritis, unspecified: Secondary | ICD-10-CM | POA: Diagnosis present

## 2018-02-05 DIAGNOSIS — R1031 Right lower quadrant pain: Secondary | ICD-10-CM | POA: Diagnosis not present

## 2018-02-05 DIAGNOSIS — E785 Hyperlipidemia, unspecified: Secondary | ICD-10-CM | POA: Diagnosis present

## 2018-02-05 DIAGNOSIS — J449 Chronic obstructive pulmonary disease, unspecified: Secondary | ICD-10-CM | POA: Diagnosis not present

## 2018-02-05 DIAGNOSIS — R52 Pain, unspecified: Secondary | ICD-10-CM

## 2018-02-05 DIAGNOSIS — D631 Anemia in chronic kidney disease: Secondary | ICD-10-CM | POA: Diagnosis not present

## 2018-02-05 DIAGNOSIS — I10 Essential (primary) hypertension: Secondary | ICD-10-CM

## 2018-02-05 DIAGNOSIS — N2581 Secondary hyperparathyroidism of renal origin: Secondary | ICD-10-CM | POA: Diagnosis not present

## 2018-02-05 DIAGNOSIS — Z79899 Other long term (current) drug therapy: Secondary | ICD-10-CM | POA: Diagnosis not present

## 2018-02-05 DIAGNOSIS — E43 Unspecified severe protein-calorie malnutrition: Secondary | ICD-10-CM

## 2018-02-05 DIAGNOSIS — Z0000 Encounter for general adult medical examination without abnormal findings: Secondary | ICD-10-CM | POA: Diagnosis not present

## 2018-02-05 DIAGNOSIS — K219 Gastro-esophageal reflux disease without esophagitis: Secondary | ICD-10-CM | POA: Diagnosis present

## 2018-02-05 DIAGNOSIS — K659 Peritonitis, unspecified: Secondary | ICD-10-CM | POA: Diagnosis not present

## 2018-02-05 DIAGNOSIS — Z780 Asymptomatic menopausal state: Secondary | ICD-10-CM | POA: Diagnosis not present

## 2018-02-05 DIAGNOSIS — K5900 Constipation, unspecified: Secondary | ICD-10-CM | POA: Diagnosis not present

## 2018-02-05 LAB — CBC
HCT: 44.2 % (ref 36.0–46.0)
Hemoglobin: 13.9 g/dL (ref 12.0–15.0)
MCH: 29.9 pg (ref 26.0–34.0)
MCHC: 31.4 g/dL (ref 30.0–36.0)
MCV: 95.1 fL (ref 78.0–100.0)
PLATELETS: 275 10*3/uL (ref 150–400)
RBC: 4.65 MIL/uL (ref 3.87–5.11)
RDW: 13 % (ref 11.5–15.5)
WBC: 4.7 10*3/uL (ref 4.0–10.5)

## 2018-02-05 LAB — URINALYSIS, ROUTINE W REFLEX MICROSCOPIC
Bilirubin Urine: NEGATIVE
GLUCOSE, UA: NEGATIVE mg/dL
Ketones, ur: NEGATIVE mg/dL
Nitrite: NEGATIVE
PH: 6 (ref 5.0–8.0)
PROTEIN: NEGATIVE mg/dL
Specific Gravity, Urine: 1.008 (ref 1.005–1.030)

## 2018-02-05 LAB — COMPREHENSIVE METABOLIC PANEL
ALK PHOS: 87 U/L (ref 38–126)
ALT: 21 U/L (ref 0–44)
AST: 34 U/L (ref 15–41)
Albumin: 3.2 g/dL — ABNORMAL LOW (ref 3.5–5.0)
Anion gap: 12 (ref 5–15)
BUN: 29 mg/dL — ABNORMAL HIGH (ref 8–23)
CALCIUM: 9.1 mg/dL (ref 8.9–10.3)
CO2: 29 mmol/L (ref 22–32)
CREATININE: 4.21 mg/dL — AB (ref 0.44–1.00)
Chloride: 98 mmol/L (ref 98–111)
GFR, EST AFRICAN AMERICAN: 11 mL/min — AB (ref >60)
GFR, EST NON AFRICAN AMERICAN: 10 mL/min — AB (ref >60)
Glucose, Bld: 128 mg/dL — ABNORMAL HIGH (ref 70–99)
Potassium: 2.7 mmol/L — CL (ref 3.5–5.1)
Sodium: 139 mmol/L (ref 135–145)
Total Bilirubin: 0.8 mg/dL (ref 0.3–1.2)
Total Protein: 6.6 g/dL (ref 6.5–8.1)

## 2018-02-05 LAB — I-STAT CG4 LACTIC ACID, ED: Lactic Acid, Venous: 0.47 mmol/L — ABNORMAL LOW (ref 0.5–1.9)

## 2018-02-05 LAB — LIPASE, BLOOD: LIPASE: 40 U/L (ref 11–51)

## 2018-02-05 MED ORDER — PIPERACILLIN-TAZOBACTAM 3.375 G IVPB 30 MIN
3.3750 g | Freq: Once | INTRAVENOUS | Status: AC
  Administered 2018-02-05: 3.375 g via INTRAVENOUS
  Filled 2018-02-05: qty 50

## 2018-02-05 MED ORDER — SODIUM CHLORIDE 0.9 % IV SOLN
INTRAVENOUS | Status: DC
  Administered 2018-02-06: 01:00:00 via INTRAVENOUS

## 2018-02-05 MED ORDER — ONDANSETRON HCL 4 MG PO TABS: 4.0000 mg | ORAL_TABLET | Freq: Four times a day (QID) | ORAL | Status: DC | PRN

## 2018-02-05 MED ORDER — ONDANSETRON HCL 4 MG/2ML IJ SOLN: 4.0000 mg | Freq: Four times a day (QID) | INTRAMUSCULAR | Status: DC | PRN

## 2018-02-05 MED ORDER — HYDROCODONE-ACETAMINOPHEN 5-325 MG PO TABS
1.0000 | ORAL_TABLET | ORAL | Status: DC | PRN
  Administered 2018-02-07 (×2): 1 via ORAL
  Administered 2018-02-08: 2 via ORAL
  Filled 2018-02-05: qty 2
  Filled 2018-02-05 (×2): qty 1
  Filled 2018-02-05: qty 2

## 2018-02-05 MED ORDER — SODIUM CHLORIDE 0.9 % IV SOLN
2.0000 g | INTRAVENOUS | Status: DC
  Filled 2018-02-05: qty 20

## 2018-02-05 MED ORDER — PIPERACILLIN-TAZOBACTAM 4.5 G IVPB: 4.5000 g | Freq: Once | INTRAVENOUS | Status: DC

## 2018-02-05 MED ORDER — PIPERACILLIN-TAZOBACTAM IN DEX 2-0.25 GM/50ML IV SOLN
2.2500 g | Freq: Four times a day (QID) | INTRAVENOUS | Status: DC
  Administered 2018-02-06 – 2018-02-07 (×6): 2.25 g via INTRAVENOUS
  Filled 2018-02-05 (×7): qty 50

## 2018-02-05 MED ORDER — ACETAMINOPHEN 650 MG RE SUPP: 650.0000 mg | Freq: Four times a day (QID) | RECTAL | Status: DC | PRN

## 2018-02-05 MED ORDER — ACETAMINOPHEN 325 MG PO TABS: 650.0000 mg | ORAL_TABLET | Freq: Four times a day (QID) | ORAL | Status: DC | PRN

## 2018-02-05 NOTE — ED Triage Notes (Signed)
Pt presents from [place] for abn CT scan of abd; pt is a peritoneal diaylsis patient who had been c/o suprapubic abd pain x 1 wk and got a CT scan done today which showed free peritoneal fluid and free peritoneal air; sent here to r/o bowel obstruc or perforation; last BM yesterday- normal per patient, denies n/v, no fever, not tachy

## 2018-02-05 NOTE — Progress Notes (Signed)
Pharmacy Antibiotic Note  [name] is a 72 y.o. female admitted on 02/05/2018 with wound infection (peritonitis) Pharmacy has been consulted for zosyn dosing.  Plan: Zosyn 3.375 gm x 1 then 2.25 gm IV q6 hours F/u need for HD F/u cultures and clinical course  Height: 5\' 3"  (160 cm) Weight: 100 lb (45.4 kg) IBW/kg (Calculated) : 52.4  Temp (24hrs), Avg:98.3 F (36.8 C), Min:98.2 F (36.8 C), Max:98.4 F (36.9 C)  Recent Labs  Lab 02/05/18 1814 02/05/18 2129  WBC 4.7  --   CREATININE 4.21*  --   LATICACIDVEN  --  0.47*    Estimated Creatinine Clearance: 8.4 mL/min (A) (by C-G formula based on SCr of 4.21 mg/dL (H)).    Allergies  Allergen Reactions  . Ace Inhibitors Anaphylaxis    REACTION: angioedema  . Diltiazem Anaphylaxis  . Other Anaphylaxis    Calcium Channel Blocking Agent Diltiazem Analogues  . Pneumococcal Vaccines Other (See Comments)    Red rash, Knot (sore and itches), A fever for a couple days (per pt report)  . Tetanus Toxoid Other (See Comments)    Cellulitis in arm  . Adhesive [Tape] Rash    pls use paper tape  . Latex Rash    Reaction to gloves     Thank you for allowing pharmacy to be a part of this patient's care.  Excell Seltzer Poteet 02/05/2018 11:34 PM

## 2018-02-05 NOTE — ED Notes (Signed)
Attempted IV insertion/blood draw with no success. Phlebotomy will attempt to draw lactic.

## 2018-02-05 NOTE — ED Notes (Signed)
Spoke with IV team, they were not able to draw back any blood from IV.

## 2018-02-05 NOTE — ED Provider Notes (Signed)
Patient placed in Quick Look pathway, seen and evaluated   Chief Complaint: Abdominal pain  HPI:   [name] is a 72 y.o. female on peritoneal dialysis who presented to ED from [place] for abnormal CT scan. She has been having Suprapubic and RLQ abdominal pain for the last week. Scan today showed free peritoneal fluid and free peritoneal air, therefore sent to ER for evaluation. She does still make urine. No back pain or dysuria.   ROS: + abdominal pain, - n/v  Physical Exam:   Gen: No distress, non-toxic appearing.   Neuro: Awake and Alert  Skin: Warm    Focused Exam: Suprapubic and very low, rlq tenderness.    Initiation of care has begun. The patient has been counseled on the process, plan, and necessity for staying for the completion/evaluation, and the remainder of the medical screening examination    Ellese Julius, Ozella Almond, PA-C 02/05/18 1803    Blanchie Dessert, MD 02/06/18 [phone]

## 2018-02-05 NOTE — Progress Notes (Signed)
Received call from Dr. Autumn Messing, ED Physician, requesting PD fluid collection on patient.  Accessed PD cath per policy, removed approx 5-6 cc clear fluid from PD cath.  Patient denied any pain during process.  PD exit site visually examined, site clean and dry.  Per patient, she does use gentamycin cream for PD drsg changes at home.  She is does not have a daytime dwell.  She also stated her PD exchanges have been clear, no fibrin, color changes, etc.  She stated "I am real careful and clean when I do that, I do not want that peritonitis."  Patient reassured that current fluid is clear.  Notified ER staff specimen collected, and minute amount collected.  Specimen hand delivered to lab.

## 2018-02-05 NOTE — ED Provider Notes (Signed)
[facility] EMERGENCY DEPARTMENT Provider Note   CSN: [id number] Arrival date & time: 02/05/18  1742     History   Chief Complaint Chief Complaint  Patient presents with  . Abdominal Pain  . Abnormal Lab    HPI [name] is a 72 y.o. female.  Patient presents the ED with abnormal CT scan that showed concern for peritoneal free fluid and free air.  Concern for bowel perforation.  Patient undergoes peritoneal dialysis.  Denies any fever, chills, shortness of breath.  Has had abdominal pain for the last several days.  The history is provided by the patient.  Abdominal Pain   This is a new problem. The current episode started more than 2 days ago. The problem occurs daily. The problem has been gradually worsening. The pain is associated with an unknown factor. The pain is located in the generalized abdominal region. The quality of the pain is aching and dull. The pain is at a severity of 5/10. The pain is moderate. Pertinent negatives include anorexia, fever, belching, nausea, vomiting, constipation, dysuria, hematuria, headaches and arthralgias. The symptoms are aggravated by palpation. Nothing relieves the symptoms. Past workup does not include surgery.    Past Medical History:  Diagnosis Date  . Acute kidney injury ([place]) 07/20/2016  . Allergic rhinitis   . Anxiety   . CAP (community acquired pneumonia) 06/2016   [name] 06/19/2016  . Cervical disc disease    Archie Endo 07/20/2016  . Chest pain at rest 06/18/2012  . COPD (chronic obstructive pulmonary disease) ([place]) 05/23/2012  . Diverticulosis   . DJD (degenerative joint disease)   . Esophageal diverticulum   . Esophageal stricture   . GERD (gastroesophageal reflux disease)   . Hiatal hernia   . History of blood transfusion 06/2016   "when I was hospitalized w/sepsis"  . Hyperlipidemia 05/23/2012   T. Chol 234, LDL 130   . Hypertension   . IBS (irritable bowel syndrome)   . Lung nodules    hx  with  extensive workup including lung biopsy ruling out Sjogren's disease/notes 07/20/2016  . Osteopenia   . Recurrent sinusitis    [name] 07/20/2016  . Renal insufficiency   . Sinus headache    "daily lately" (07/20/2016)  . Traumatic arthritis     Patient Active Problem List   Diagnosis Date Noted  . Peritonitis ([place]) 02/05/2018  . Abdominal pain 02/05/2018  . Hypokalemia 02/05/2018  . Pulmonary nodules   . ESRD (end stage renal disease) on dialysis ([place])   . 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 Hyperphosphatemia 07/25/2016  . Acute renal failure (ARF) ([place])   . Essential hypertension 12/25/2007    Past Surgical History:  Procedure Laterality Date  . ANTERIOR CERVICAL DECOMP/DISCECTOMY FUSION  2005  . BACK SURGERY    . COLONOSCOPY  2001   Archie Endo 09/21/2010  . CYSTECTOMY Bilateral    hx/notes 09/21/2010  . IR GENERIC HISTORICAL  07/20/2016   IR FLUORO GUIDE CV LINE RIGHT 07/20/2016 [name], MD [facility]  . IR GENERIC HISTORICAL  07/20/2016   IR US GUIDE VASC ACCESS RIGHT 07/20/2016 [name], MD [facility]  . LUNG BIOPSY Left    left side, not cancerous, thoracotomy  . TONSILLECTOMY AND ADENOIDECTOMY     hx/notes 09/21/2010  . VAGINAL HYSTERECTOMY     hx w/oophorectomy/notes 09/21/2010     OB History   None      Home Medications  Prior to Admission medications   Medication Sig Start Date End Date Taking? Authorizing Provider  acetaminophen (TYLENOL) 500 MG tablet Take 1,000 mg by mouth every 6 (six) hours as needed for headache (pain).   Yes [provider]  amLODipine (NORVASC) 10 MG tablet Take 10 mg by mouth daily.    Yes [provider]  aspirin EC 81 MG tablet Take 81 mg by mouth daily.   Yes [provider]  atorvastatin (LIPITOR) 10 MG tablet Take 1 tablet (10 mg total) by mouth daily. 10/20/15  Yes Biagio Borg, MD  Cholecalciferol (VITAMIN D) 2000 units  tablet Take 4,000 Units by mouth daily.   Yes [provider]  ferric citrate (AURYXIA) 1 GM 210 MG(Fe) tablet Take 210 mg by mouth 3 (three) times daily with meals.   Yes [provider]  lactulose (CHRONULAC) 10 GM/15ML solution Take 10 g by mouth daily as needed for mild constipation.   Yes [provider]  multivitamin (RENA-VIT) TABS tablet Take 1 tablet by mouth daily.   Yes [provider]  pantoprazole (PROTONIX) 40 MG tablet Take 1 tablet (40 mg total) by mouth daily. 10/20/15  Yes Biagio Borg, MD  sodium chloride (OCEAN) 0.65 % SOLN nasal spray Place 2 sprays into both nostrils daily as needed for congestion.    Yes [provider]  torsemide (DEMADEX) 20 MG tablet Take 40 mg by mouth daily. 01/03/18  Yes [provider]  traMADol (ULTRAM) 50 MG tablet Take 50 mg by mouth every 6 (six) hours as needed (pain).   Yes [provider]  bisacodyl (DULCOLAX) 10 MG suppository Place 1 suppository (10 mg total) rectally daily as needed for moderate constipation. Patient not taking: Reported on 02/05/2018 08/14/16   Modena Jansky, MD  HYDROcodone-acetaminophen (NORCO/VICODIN) 5-325 MG tablet Take 1 tablet by mouth every 8 (eight) hours as needed for moderate pain or severe pain. Patient not taking: Reported on 02/05/2018 11/09/17   Jaynee Eagles, PA-C  polyethylene glycol [facility] / Floria Raveling) packet Take 17 g by mouth daily. Patient not taking: Reported on 02/05/2018 08/15/16   Modena Jansky, MD    Family History Family History  Problem Relation Age of Onset  . Throat cancer Father   . Hypertension Sister   . Thyroid disease Daughter   . Hypertension Sister        x 2  . Breast cancer Maternal Aunt   . Blindness Sister        legally blind  . Colon cancer Neg Hx   . Stomach cancer Neg Hx     Social History Social History   Tobacco Use  . Smoking status: Former Smoker    Packs/day: 0.12    Years: 8.00    Pack years:  0.96    Types: Cigarettes    Last attempt to quit: 05/04/1985    Years since quitting: 32.7  . Smokeless tobacco: Never Used  Substance Use Topics  . Alcohol use: Yes    Alcohol/week: 1.0 standard drinks    Types: 1 Cans of beer per week  . Drug use: No     Allergies   Ace inhibitors; Diltiazem; Other; Pneumococcal vaccines; Tetanus toxoid; Adhesive [tape]; and Latex   Review of Systems Review of Systems  Constitutional: Negative for chills and fever.  HENT: Negative for ear pain and sore throat.   Eyes: Negative for pain and visual disturbance.  Respiratory: Negative for cough and shortness of breath.  Cardiovascular: Negative for chest pain and palpitations.  Gastrointestinal: Positive for abdominal pain. Negative for anorexia, constipation, nausea and vomiting.  Genitourinary: Negative for dysuria and hematuria.  Musculoskeletal: Negative for arthralgias and back pain.  Skin: Negative for color change and rash.  Neurological: Negative for seizures, syncope and headaches.  All other systems reviewed and are negative.    Physical Exam Updated Vital Signs  ED Triage Vitals  Enc Vitals Group     BP 02/05/18 1754 (!) 157/77     Pulse Rate 02/05/18 1754 77     Resp 02/05/18 1754 18     Temp 02/05/18 1754 98.4 F (36.9 C)     Temp Source 02/05/18 1754 Oral     SpO2 02/05/18 1754 100 %     Weight 02/05/18 1755 100 lb (45.4 kg)     Height 02/05/18 1755 5\' 3"  (1.6 m)     Head Circumference --      Peak Flow --      Pain Score 02/05/18 1754 7     Pain Loc --      Pain Edu? --      Excl. in [place]? --     Physical Exam  Constitutional: She appears well-developed and well-nourished. No distress.  HENT:  Head: Normocephalic and atraumatic.  Eyes: Pupils are equal, round, and reactive to light. Conjunctivae and EOM are normal.  Neck: Neck supple.  Cardiovascular: Normal rate and regular rhythm.  No murmur heard. Pulmonary/Chest: Effort normal and breath sounds normal.  No respiratory distress.  Abdominal: There is tenderness in the right lower quadrant, suprapubic area and left lower quadrant. There is guarding. There is no rigidity and no rebound.  Musculoskeletal: She exhibits no edema.  Neurological: She is alert.  Skin: Skin is warm and dry. Capillary refill takes less than 2 seconds.  Psychiatric: She has a normal mood and affect.  Nursing note and vitals reviewed.    ED Treatments / Results  Labs (all labs ordered are listed, but only abnormal results are displayed) Labs Reviewed  COMPREHENSIVE METABOLIC PANEL - Abnormal; Notable for the following components:      Result Value   Potassium 2.7 (*)    Glucose, Bld 128 (*)    BUN 29 (*)    Creatinine, Ser 4.21 (*)    Albumin 3.2 (*)    GFR calc non Af Amer 10 (*)    GFR calc Af Amer 11 (*)    All other components within normal limits  URINALYSIS, ROUTINE W REFLEX MICROSCOPIC - Abnormal; Notable for the following components:   Hgb urine dipstick SMALL (*)    Leukocytes, UA MODERATE (*)    Bacteria, UA RARE (*)    All other components within normal limits  I-STAT CG4 LACTIC ACID, ED - Abnormal; Notable for the following components:   Lactic Acid, Venous 0.47 (*)    All other components within normal limits  CULTURE, BLOOD (ROUTINE X 2)  CULTURE, BLOOD (ROUTINE X 2)  URINE CULTURE  BODY FLUID CULTURE  LIPASE, BLOOD  CBC  LACTATE DEHYDROGENASE, PLEURAL OR PERITONEAL FLUID  GLUCOSE, PLEURAL OR PERITONEAL FLUID  PROTEIN, PLEURAL OR PERITONEAL FLUID  ALBUMIN, PLEURAL OR PERITONEAL FLUID  BODY FLUID CELL COUNT WITH DIFFERENTIAL  MAGNESIUM  PHOSPHORUS  MAGNESIUM  PHOSPHORUS  TSH  COMPREHENSIVE METABOLIC PANEL  CBC  I-STAT CG4 LACTIC ACID, ED    EKG None  Radiology Ct Abdomen Pelvis Wo Contrast  Result Date: 02/05/2018 CLINICAL DATA:  Right lower  quadrant abdominal pain for 1 week. No fever or vomiting. No change in bowel habits. Undergoing peritoneal dialysis. Her last dialysis  was last night. EXAM: CT ABDOMEN AND PELVIS WITHOUT CONTRAST TECHNIQUE: Multidetector CT imaging of the abdomen and pelvis was performed following the standard protocol without IV contrast. COMPARISON:  PET-CT dated 09/12/2016. Chest, abdomen and pelvis CT dated 08/22/2008. FINDINGS: Lower chest: Moderate-sized hiatal hernia with an air-fluid level. Stable elevation of the left hemidiaphragm. Hepatobiliary: Multiple small noncalcified gallstones in the dependent portion of the gallbladder, measuring up to 2-3 mm in diameter each. No gallbladder wall thickening or pericholecystic fluid. Pancreas: Unremarkable. No pancreatic ductal dilatation or surrounding inflammatory changes. Spleen: Normal in size without focal abnormality. Adrenals/Urinary Tract: Adrenal glands are unremarkable. Kidneys are normal, without renal calculi, focal lesion, or hydronephrosis. Bladder is unremarkable. Stomach/Bowel: Moderate-sized hiatal hernia. Unremarkable small bowel and colon. No evidence of appendicitis. Vascular/Lymphatic: Mild atheromatous arterial calcifications. No enlarged lymph nodes. Reproductive: Status post hysterectomy. No adnexal masses. Other: Small to moderate amount of free peritoneal air and small amount of free peritoneal fluid. Peritoneal dialysis catheter. Musculoskeletal: Mid and lower lumbar spine facet degenerative changes. IMPRESSION: 1. Small to moderate amount of free peritoneal air and small amount of free peritoneal fluid. This is most likely due to the patient's recent peritoneal dialysis. However, correlation with the clinical findings is necessary to exclude the possibility of bowel perforation. This will be called to the referring clinician's office. 2. No evidence of appendicitis. 3. Cholelithiasis without evidence of acute cholecystitis. 4. Moderate-sized hiatal hernia. Electronically Signed   By: Claudie Revering M.D.   On: 02/05/2018 16:37    Procedures Procedures (including critical care  time)  Medications Ordered in ED Medications  acetaminophen (TYLENOL) tablet 650 mg (has no administration in time range)    Or  acetaminophen (TYLENOL) suppository 650 mg (has no administration in time range)  HYDROcodone-acetaminophen (NORCO/VICODIN) 5-325 MG per tablet 1-2 tablet (has no administration in time range)  ondansetron (ZOFRAN) tablet 4 mg (has no administration in time range)    Or  ondansetron (ZOFRAN) injection 4 mg (has no administration in time range)  0.9 %  sodium chloride infusion (has no administration in time range)  piperacillin-tazobactam (ZOSYN) IVPB 2.25 g (has no administration in time range)  piperacillin-tazobactam (ZOSYN) IVPB 3.375 g (3.375 g Intravenous New Bag/Given 02/05/18 2326)     Initial Impression / Assessment and Plan / ED Course  I have reviewed the triage vital signs and the nursing notes.  Pertinent labs & imaging results that were available during my care of the patient were reviewed by me and considered in my medical decision making (see chart for details).     [name] is a 72 year old female with end-stage renal disease on peritoneal dialysis who presents to the ED with abdominal pain and abnormal CT scan.  Patient with normal vitals.  No fever.  Patient with abdominal pain for the last several days.  Otherwise no symptoms.  Had a CT scan of the abdomen and pelvis ordered by primary care doctor that showed peritoneal free fluid and free air.  Possible perforated bowel.  Patient extremely tender in the right lower quadrant on abdominal exam and mildly elsewhere.  No obvious peritonitis on exam but does have some guarding.  She denies any urinary symptoms.  She denies any fever, chills.  Dr. Ninfa Linden with general surgery was consulted and will evaluate the patient in the morning.  Will review imaging. Unlikely bowel perforation  at this time given unremarkable vitals.  No leukocytosis.  Normal lactic acid.  However may be too early to tell  and given pain will need serial abdominal exams, monitoring.  Concern for possible SBP given peritoneal dialysis patient.  Given empiric IV Zosyn to cover for intra-abdominal infection.  Dialysis nurse was called to pull off peritoneal fluid for evaluation for SBP.  Nephrology consulted as patient will need dialysis in patient.  Potassium was 2.7.  Otherwise no significant electrolyte abnormality. Urinalysis unremarkable for infection.  Patient to be admitted for further evaluation and care with the hospitalist service.  Peritoneal fluid analysis pending.  Patient hemodynamically stable throughout my care.  At this time no real high suspicion for perforated bowel or for SBP but given CT findings and abdominal pain will need serial exams/further eval and monitoring.   This chart was dictated using voice recognition software.  Despite best efforts to proofread,  errors can occur which can change the documentation meaning.   Final Clinical Impressions(s) / ED Diagnoses   Final diagnoses:  Generalized abdominal pain    ED Discharge Orders    None       Lennice Sites, DO 02/06/18 7353

## 2018-02-05 NOTE — H&P (Signed)
[name]:[id number] DOB: 1946/03/19 DOA: 02/05/2018     PCP: Merrilee Seashore, MD   Outpatient Specialists:    . NEphrology:   Dr. Joelyn Oms   Pulmonary  Tammy Parrett    Patient arrived to ER on 02/05/18 at 1742  Patient coming from: home Lives alone,      Chief Complaint:  Chief Complaint  Patient presents with  . Abdominal Pain  . Abnormal Lab    HPI: [name] is a 72 y.o. 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 Worse when she tries to sit up or lift right leg.  Otherwise no nausea diarrhea fever or any other complaints. She still makes urine Undergoing evaluation at [facility] for renal/ pancreas transplant.  Regarding pertinent Chronic problems: She was treated with plasmapheresis , IV Solu-Medrol and discharged on prednisone.was unable to tolerate cytoxan . Now on dialysis (T/TH /Sat ) . She is following with renal.  Currently on prednisone 20mg  daily.    While in ER:  The following Work up has been ordered so far:  Orders Placed This Encounter  Procedures  . Blood culture (routine x 2)  . Urine culture  . Body fluid culture  . Lipase, blood  . Comprehensive metabolic panel  . CBC  . Urinalysis, Routine w reflex microscopic  . Lactate dehydrogenase (pleural or peritoneal fluid)  . Glucose, pleural or peritoneal fluid  . Protein, pleural or peritoneal fluid  . Albumin, pleural or peritoneal fluid  . Diet NPO time specified  . Saline  Lock IV, Maintain IV access  . Consult to general surgery  . Consult to hospitalist  . I-Stat CG4 Lactic Acid, ED      Following Medications were ordered in ER: Medications  cefTRIAXone (ROCEPHIN) 2 g in sodium chloride 0.9 % 100 mL IVPB (has no administration in time range)    Significant initial  Findings: Abnormal Labs Reviewed  COMPREHENSIVE METABOLIC PANEL - Abnormal; Notable for the following components:      Result Value   Potassium 2.7 (*)    Glucose, Bld 128 (*)    BUN 29 (*)    Creatinine, Ser 4.21 (*)    Albumin 3.2 (*)    GFR calc non Af Amer 10 (*)    GFR calc Af Amer 11 (*)    All other components within normal limits  I-STAT CG4 LACTIC ACID, ED - Abnormal; Notable for the following components:   Lactic Acid, Venous 0.47 (*)    All other components within normal limits     Na 139 K 2.7  Cr   Stable,  Lab Results  Component Value Date   CREATININE 4.21 (H) 02/05/2018   CREATININE 4.91 (H) 10/17/2017   CREATININE 3.64 (H) 08/14/2016      WBC  4.7  HG/HCT stable,     Component Value Date/Time   HGB 13.9 02/05/2018 1814   HCT 44.2 02/05/2018 1814      BNP (last 3 results) No results for input(s): BNP in the last 8760 hours.  ProBNP (last 3 results) No results for input(s): PROBNP in the last 8760 hours.  Lactic Acid, Venous    Component Value Date/Time   LATICACIDVEN 0.47 (L) 02/05/2018 2129      UA  no evidence of UTI      CTabd/pelvis -  Small to moderate amount of free peritoneal air and small amount of free peritoneal fluid.  ECG:  Not obtained   ED Triage Vitals  Enc Vitals Group     BP 02/05/18 1754 (!) 157/77     Pulse Rate 02/05/18 1754 77     Resp 02/05/18 1754 18     Temp 02/05/18 1754 98.4 F (36.9 C)     Temp Source 02/05/18 1754 Oral     SpO2 02/05/18 1754 100 %     Weight 02/05/18 1755 100 lb (45.4 kg)     Height 02/05/18 1755 5\' 3"  (1.6 m)     Head Circumference --      Peak Flow --      Pain Score  02/05/18 1754 7     Pain Loc --      Pain Edu? --      Excl. in [place]? --   TMAX(24)@       Latest  Blood pressure (!) 142/65, pulse 64, temperature 98.2 F (36.8 C), temperature source Oral, resp. rate 16, height 5\' 3"  (1.6 m), weight 45.4 kg, SpO2 100 %.    ER Provider Called:  General Surgery   Dr. Ninfa Linden They Recommend obtaining peritoneal fluid ruling out SBP Will see in AM They will follow along   Hospitalist was called for admission for possible peritonitis   Review of Systems:    Pertinent positives include: abdominal pain,  Constitutional:  No weight loss, night sweats, Fevers, chills, fatigue, weight loss  HEENT:  No headaches, Difficulty swallowing,Tooth/dental problems,Sore throat,  No sneezing, itching, ear ache, nasal congestion, post nasal drip,  Cardio-vascular:  No chest pain, Orthopnea, PND, anasarca, dizziness, palpitations.no Bilateral lower extremity swelling  GI:  No heartburn, indigestion,  nausea, vomiting, diarrhea, change in bowel habits, loss of appetite, melena, blood in stool, hematemesis Resp:  no shortness of breath at rest. No dyspnea on exertion, No excess mucus, no productive cough, No non-productive cough, No coughing up of blood.No change in color of mucus.No wheezing. Skin:  no rash or lesions. No jaundice GU:  no dysuria, change in color of urine, no urgency or frequency. No straining to urinate.  No flank pain.  Musculoskeletal:  No joint pain or no joint swelling. No decreased range of motion. No back pain.  Psych:  No change in mood or affect. No depression or anxiety. No memory loss.  Neuro: no localizing neurological complaints, no tingling, no weakness, no double vision, no gait abnormality, no slurred speech, no confusion  All systems reviewed and apart from [place] all are negative  Past Medical History:   Past Medical History:  Diagnosis Date  . Acute kidney injury ([place]) 07/20/2016  . Allergic rhinitis   . Anxiety   .  CAP (community acquired pneumonia) 06/2016   [name] 06/19/2016  . Cervical disc disease    Archie Endo 07/20/2016  . Chest pain at rest 06/18/2012  . COPD (chronic obstructive pulmonary disease) ([place]) 05/23/2012  . Diverticulosis   . DJD (degenerative joint disease)   . Esophageal diverticulum   . Esophageal stricture   . GERD (gastroesophageal reflux disease)   . Hiatal hernia   . History of blood transfusion 06/2016   "  when I was hospitalized w/sepsis"  . Hyperlipidemia 05/23/2012   T. Chol 234, LDL 130   . Hypertension   . IBS (irritable bowel syndrome)   . Lung nodules    hx  with extensive workup including lung biopsy ruling out Sjogren's disease/notes 07/20/2016  . Osteopenia   . Recurrent sinusitis    [name] 07/20/2016  . Renal insufficiency   . Sinus headache    "daily lately" (07/20/2016)  . Traumatic arthritis       Past Surgical History:  Procedure Laterality Date  . ANTERIOR CERVICAL DECOMP/DISCECTOMY FUSION  2005  . BACK SURGERY    . COLONOSCOPY  2001   Archie Endo 09/21/2010  . CYSTECTOMY Bilateral    hx/notes 09/21/2010  . IR GENERIC HISTORICAL  07/20/2016   IR FLUORO GUIDE CV LINE RIGHT 07/20/2016 [name], MD [facility]  . IR GENERIC HISTORICAL  07/20/2016   IR US GUIDE VASC ACCESS RIGHT 07/20/2016 [name], MD [facility]  . LUNG BIOPSY Left    left side, not cancerous, thoracotomy  . TONSILLECTOMY AND ADENOIDECTOMY     hx/notes 09/21/2010  . VAGINAL HYSTERECTOMY     hx w/oophorectomy/notes 09/21/2010    Social History:  Ambulatory   independently       reports that she quit smoking about 32 years ago. Her smoking use included cigarettes. She has a 0.96 pack-year smoking history. She has never used smokeless tobacco. She reports that she drinks about 1.0 standard drinks of alcohol per week. She reports that she does not use drugs.     Family History:  Family History  Problem Relation Age of Onset  . Throat cancer Father   . Hypertension Sister   .  Thyroid disease Daughter   . Hypertension Sister        x 2  . Breast cancer Maternal Aunt   . Blindness Sister        legally blind  . Colon cancer Neg Hx   . Stomach cancer Neg Hx     Allergies: Allergies  Allergen Reactions  . Ace Inhibitors Anaphylaxis    REACTION: angioedema  . Diltiazem Anaphylaxis  . Other Anaphylaxis    Calcium Channel Blocking Agent Diltiazem Analogues  . Pneumococcal Vaccines Other (See Comments)    Red rash, Knot (sore and itches), A fever for a couple days (per pt report)  . Tetanus Toxoid Other (See Comments)    Cellulitis in arm  . Adhesive [Tape] Rash    pls use paper tape  . Latex Rash    Reaction to gloves     Prior to Admission medications   Medication Sig Start Date End Date Taking? Authorizing Provider  amLODipine (NORVASC) 5 MG tablet Take 5 mg by mouth daily.    [provider]  aspirin EC 81 MG tablet Take 81 mg by mouth daily.    [provider]  atorvastatin (LIPITOR) 10 MG tablet Take 1 tablet (10 mg total) by mouth daily. 10/20/15   Biagio Borg, MD  bisacodyl (DULCOLAX) 10 MG suppository Place 1 suppository (10 mg total) rectally daily as needed for moderate constipation. 08/14/16   Hongalgi, Lenis Dickinson, MD  HYDROcodone-acetaminophen (NORCO/VICODIN) 5-325 MG tablet Take 1 tablet by mouth every 8 (eight) hours as needed for moderate pain or severe pain. 11/09/17   Jaynee Eagles, PA-C  Multiple Vitamin (MULTIVITAMIN) tablet Take 1 tablet by mouth daily.    [provider]  pantoprazole (PROTONIX) 40 MG tablet Take 1 tablet (40  mg total) by mouth daily. 10/20/15   Biagio Borg, MD  polyethylene glycol [facility] / Floria Raveling) packet Take 17 g by mouth daily. 08/15/16   Hongalgi, Lenis Dickinson, MD  sodium chloride (OCEAN) 0.65 % SOLN nasal spray Place 2 sprays into both nostrils as needed.     [provider]  valACYclovir (VALTREX) 500 MG tablet Take 1 tablet (500 mg total) by mouth daily. 11/09/17   Jaynee Eagles, PA-C    Physical Exam: Blood pressure (!) 142/65, pulse 64, temperature 98.2 F (36.8 C), temperature source Oral, resp. rate 16, height 5\' 3"  (1.6 m), weight 45.4 kg, SpO2 100 %. 1. General:  in No Acute distress   Chronically ill -appearing 2. Psychological: Alert and Oriented 3. Head/ENT:     Dry Mucous Membranes                          Head Non traumatic, neck supple                            Poor Dentition 4. SKIN:   decreased Skin turgor,  Skin clean Dry and intact no rash 5. Heart: Regular rate and rhythm no  Murmur, no Rub or gallop 6. Lungs: Clear to auscultation bilaterally, no wheezes or crackles   7. Abdomen: Soft, right lower quadrant tenderness no guarding no rebound, worse with trying to sit up, Non distended bowel sounds present 8. Lower extremities: no clubbing, cyanosis, or  edema 9. Neurologically Grossly intact, moving all 4 extremities equally  10. MSK: Normal range of motion   LABS:     Recent Labs  Lab 02/05/18 1814  WBC 4.7  HGB 13.9  HCT 44.2  MCV 95.1  PLT 026   Basic Metabolic Panel: Recent Labs  Lab 02/05/18 1814  NA 139  K 2.7*  CL 98  CO2 29  GLUCOSE 128*  BUN 29*  CREATININE 4.21*  CALCIUM 9.1      Recent Labs  Lab 02/05/18 1814  AST 34  ALT 21  ALKPHOS 87  BILITOT 0.8  PROT 6.6  ALBUMIN 3.2*   Recent Labs  Lab 02/05/18 1814  LIPASE 40   No results for input(s): AMMONIA in the last 168 hours.    HbA1C: No results for input(s): HGBA1C in the last 72 hours. CBG: No results for input(s): GLUCAP in the last 168 hours.    Urine analysis:    Component Value Date/Time   COLORURINE STRAW (A) 06/20/2016 0129   APPEARANCEUR CLEAR 06/20/2016 0129   LABSPEC 1.008 06/20/2016 0129   PHURINE 7.0 06/20/2016 0129   GLUCOSEU NEGATIVE 06/20/2016 0129   GLUCOSEU NEGATIVE 06/03/2014 1038   HGBUR MODERATE (A) 06/20/2016 0129   BILIRUBINUR NEGATIVE 06/20/2016 0129   KETONESUR NEGATIVE 06/20/2016 0129   PROTEINUR NEGATIVE  06/20/2016 0129   UROBILINOGEN 0.2 06/03/2014 1038   NITRITE NEGATIVE 06/20/2016 0129   LEUKOCYTESUR TRACE (A) 06/20/2016 0129       Cultures:    Component Value Date/Time   SDES BLOOD LEFT ANTECUBITAL 06/20/2016 0059   SDES BLOOD BLOOD LEFT HAND 06/20/2016 0059   SPECREQUEST BOTTLES DRAWN AEROBIC AND ANAEROBIC 5CC EA 06/20/2016 0059   SPECREQUEST IN PEDIATRIC BOTTLE 1CC 06/20/2016 0059   CULT NO GROWTH 5 DAYS 06/20/2016 0059   CULT NO GROWTH 5 DAYS 06/20/2016 0059   REPTSTATUS 06/25/2016 FINAL 06/20/2016 0059   REPTSTATUS 06/25/2016 FINAL 06/20/2016 0059  Radiological Exams on Admission: Ct Abdomen Pelvis Wo Contrast  Result Date: 02/05/2018 CLINICAL DATA:  Right lower quadrant abdominal pain for 1 week. No fever or vomiting. No change in bowel habits. Undergoing peritoneal dialysis. Her last dialysis was last night. EXAM: CT ABDOMEN AND PELVIS WITHOUT CONTRAST TECHNIQUE: Multidetector CT imaging of the abdomen and pelvis was performed following the standard protocol without IV contrast. COMPARISON:  PET-CT dated 09/12/2016. Chest, abdomen and pelvis CT dated 08/22/2008. FINDINGS: Lower chest: Moderate-sized hiatal hernia with an air-fluid level. Stable elevation of the left hemidiaphragm. Hepatobiliary: Multiple small noncalcified gallstones in the dependent portion of the gallbladder, measuring up to 2-3 mm in diameter each. No gallbladder wall thickening or pericholecystic fluid. Pancreas: Unremarkable. No pancreatic ductal dilatation or surrounding inflammatory changes. Spleen: Normal in size without focal abnormality. Adrenals/Urinary Tract: Adrenal glands are unremarkable. Kidneys are normal, without renal calculi, focal lesion, or hydronephrosis. Bladder is unremarkable. Stomach/Bowel: Moderate-sized hiatal hernia. Unremarkable small bowel and colon. No evidence of appendicitis. Vascular/Lymphatic: Mild atheromatous arterial calcifications. No enlarged lymph nodes. Reproductive:  Status post hysterectomy. No adnexal masses. Other: Small to moderate amount of free peritoneal air and small amount of free peritoneal fluid. Peritoneal dialysis catheter. Musculoskeletal: Mid and lower lumbar spine facet degenerative changes. IMPRESSION: 1. Small to moderate amount of free peritoneal air and small amount of free peritoneal fluid. This is most likely due to the patient's recent peritoneal dialysis. However, correlation with the clinical findings is necessary to exclude the possibility of bowel perforation. This will be called to the referring clinician's office. 2. No evidence of appendicitis. 3. Cholelithiasis without evidence of acute cholecystitis. 4. Moderate-sized hiatal hernia. Electronically Signed   By: Claudie Revering M.D.   On: 02/05/2018 16:37    Chart has been reviewed    Assessment/Plan  72 y.o. 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 Peritonitis ([place]) - await results of peritoneal fluid. Only 3 ml of clear fluid obtained. - Cover with IV antibiotics await results of fluid and blood culture and keep NPO, General Surgery is aware . Abdominal pain - Unclear etiology given free air will keep NPO and cover with IV zosyn, Appreciate general surgery recommendations.  . Essential hypertension - hold home medications . Hypokalemia - defer to nephrology if PT to have Peritoneal Dialysis today can be corrected Otherwise will need IV KCL   Other plan as per orders.  DVT prophylaxis:  SCD     Code Status:  FULL CODE  as per patient   I had personally discussed CODE STATUS with patient   Family Communication:   Family not at  Bedside    Disposition Plan:    To home once workup is complete and patient is stable                                         Consults called:  General Surgery, Nephrology   Admission status:    inpatient     Expect 2 midnight stay secondary to severity of patient's current illness including hemodynamic instability despite optimal treatment    Severe lab abnormalities including hypokalemia and extensive comorbidities including:   CKD   That are currently affecting medical management.  I expect  patient to be  hospitalized for 2 midnights requiring inpatient medical care.  Patient is at high risk for adverse outcome (such as loss of life or disability) if not treated.  Indication for inpatient stay as follows:    severe pain requiring acute inpatient management,  inability to maintain oral hydration     Need for IV antibiotics, IV fluids,       Level of care  tele  For 12H      Myriah Boggus 02/05/2018, 11:32 PM    [facility]  Pager [phone]   after 2 AM please page floor coverage PA If 7AM-7PM, please contact the day team taking care of the patient  [email or web address]  Password [email or web address]

## 2018-02-06 ENCOUNTER — Other Ambulatory Visit: Payer: Self-pay

## 2018-02-06 DIAGNOSIS — R1084 Generalized abdominal pain: Secondary | ICD-10-CM

## 2018-02-06 DIAGNOSIS — E43 Unspecified severe protein-calorie malnutrition: Secondary | ICD-10-CM

## 2018-02-06 LAB — COMPREHENSIVE METABOLIC PANEL
ALBUMIN: 3.1 g/dL — AB (ref 3.5–5.0)
ALT: 23 U/L (ref 0–44)
AST: 35 U/L (ref 15–41)
Alkaline Phosphatase: 83 U/L (ref 38–126)
Anion gap: 14 (ref 5–15)
BUN: 31 mg/dL — AB (ref 8–23)
CHLORIDE: 98 mmol/L (ref 98–111)
CO2: 29 mmol/L (ref 22–32)
CREATININE: 4.15 mg/dL — AB (ref 0.44–1.00)
Calcium: 9 mg/dL (ref 8.9–10.3)
GFR calc Af Amer: 11 mL/min — ABNORMAL LOW (ref >60)
GFR, EST NON AFRICAN AMERICAN: 10 mL/min — AB (ref >60)
GLUCOSE: 94 mg/dL (ref 70–99)
Potassium: 2.9 mmol/L — ABNORMAL LOW (ref 3.5–5.1)
Sodium: 141 mmol/L (ref 135–145)
Total Bilirubin: 0.5 mg/dL (ref 0.3–1.2)
Total Protein: 6.7 g/dL (ref 6.5–8.1)

## 2018-02-06 LAB — CBC
HEMATOCRIT: 44.1 % (ref 36.0–46.0)
Hemoglobin: 14.2 g/dL (ref 12.0–15.0)
MCH: 30.1 pg (ref 26.0–34.0)
MCHC: 32.2 g/dL (ref 30.0–36.0)
MCV: 93.6 fL (ref 78.0–100.0)
PLATELETS: 251 10*3/uL (ref 150–400)
RBC: 4.71 MIL/uL (ref 3.87–5.11)
RDW: 13.1 % (ref 11.5–15.5)
WBC: 4.2 10*3/uL (ref 4.0–10.5)

## 2018-02-06 LAB — TSH: TSH: 3.549 u[IU]/mL (ref 0.350–4.500)

## 2018-02-06 LAB — PHOSPHORUS: Phosphorus: 3.4 mg/dL (ref 2.5–4.6)

## 2018-02-06 LAB — BODY FLUID CELL COUNT WITH DIFFERENTIAL
EOS FL: 1 %
Lymphs, Fluid: 4 %
Monocyte-Macrophage-Serous Fluid: 8 % — ABNORMAL LOW (ref 50–90)
Neutrophil Count, Fluid: 87 % — ABNORMAL HIGH (ref 0–25)
WBC FLUID: 1115 uL — AB (ref 0–1000)

## 2018-02-06 LAB — MAGNESIUM: Magnesium: 1.6 mg/dL — ABNORMAL LOW (ref 1.7–2.4)

## 2018-02-06 MED ORDER — DELFLEX-LC/2.5% DEXTROSE 394 MOSM/L IP SOLN: INTRAPERITONEAL | Status: DC

## 2018-02-06 MED ORDER — HEPARIN 1000 UNIT/ML FOR PERITONEAL DIALYSIS: 500.0000 [IU] | INTRAMUSCULAR | Status: DC | PRN

## 2018-02-06 MED ORDER — DELFLEX-LC/1.5% DEXTROSE 344 MOSM/L IP SOLN: INTRAPERITONEAL | Status: AC

## 2018-02-06 MED ORDER — MAGNESIUM OXIDE 400 (241.3 MG) MG PO TABS
400.0000 mg | ORAL_TABLET | Freq: Two times a day (BID) | ORAL | Status: DC
  Administered 2018-02-06 – 2018-02-08 (×5): 400 mg via ORAL
  Filled 2018-02-06 (×6): qty 1

## 2018-02-06 MED ORDER — RENA-VITE PO TABS
1.0000 | ORAL_TABLET | Freq: Every day | ORAL | Status: DC
  Administered 2018-02-07: 1 via ORAL
  Filled 2018-02-06: qty 1

## 2018-02-06 MED ORDER — DELFLEX-LC/1.5% DEXTROSE 344 MOSM/L IP SOLN
INTRAPERITONEAL | Status: DC
  Administered 2018-02-07: 5000 mL via INTRAPERITONEAL

## 2018-02-06 MED ORDER — HEPARIN 1000 UNIT/ML FOR PERITONEAL DIALYSIS
INTRAPERITONEAL | Status: DC | PRN
  Filled 2018-02-06: qty 3000

## 2018-02-06 MED ORDER — POTASSIUM CHLORIDE CRYS ER 20 MEQ PO TBCR
40.0000 meq | EXTENDED_RELEASE_TABLET | Freq: Once | ORAL | Status: AC
  Administered 2018-02-06: 40 meq via ORAL
  Filled 2018-02-06: qty 2

## 2018-02-06 MED ORDER — GENTAMICIN SULFATE 0.1 % EX CREA
1.0000 "application " | TOPICAL_CREAM | Freq: Every day | CUTANEOUS | Status: DC
  Administered 2018-02-07 – 2018-02-08 (×3): 1 via TOPICAL
  Filled 2018-02-06: qty 15

## 2018-02-06 MED ORDER — DELFLEX-LC/1.5% DEXTROSE 344 MOSM/L IP SOLN: INTRAPERITONEAL | Status: DC

## 2018-02-06 NOTE — Progress Notes (Addendum)
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  CT scan showed possible free air and free fluid and there was concern for possible perforation.  There was no mention of peritoneal dialysis catheter malfunction.  Patient was then admitted to the hospital for possible peritonitis.  Please see following for details.  Assessment & Plan:   Active Problems:   Essential hypertension   ANCA-positive vasculitis (HCC)   ESRD (end stage renal disease) on dialysis ([place])   Peritonitis ([place])   Abdominal pain   Hypokalemia  Possible peritonitis.  IV antibiotics with Zosyn.  Surgery is following the patient.  Currently on conservative treatment.  Normal white cells and lactic acid.  Patient has been advanced to clear liquids by surgery.  Peritoneal fluid- negative cultures in 12 hours.  Blood cultures negative in 12 hours.  Nephrology has been consulted for peritoneal dialysis.  History of ANCA positive vasculitis. Nephrology on board.  In the past patient was treated with plasmapheresis IV Solu-Medrol but was unable to tolerate Cytoxan.  Essential hypertension.  Vitals stable.  Patient medications on hold. Restart oral amlodipine.  Severe protein calorie nutrition present on admission.  Nutrition on board.  Encourage improved nutrition.  Hypokalemia.    We will continue to replenish monitor closely. Replace KCL 40 meq orally  Mild hypomagnesemia.  We will replenish mag oxide   DVT prophylaxis: SCD  Code Status: Full code  Family Communication:   Disposition Plan: Home  Consultants: General surgery, nephrology  Procedures: Dialysis  Antimicrobials:   Zosyn  Subjective:  Complaint of mild abdominal pain.  No nausea vomiting fever or chills.  No shortness of breath or cough  Objective: Vitals:   02/05/18 2300 02/06/18 0018 02/06/18 0514 02/06/18 0940  BP: (!) 152/72 (!) 137/57 128/60 120/72  Pulse: 76 62 (!) 57 62  Resp: 16 20 20 18   Temp:  98.3 F (36.8 C) 98.4 F (36.9 C) 98.2 F (36.8 C)  TempSrc:  Oral Oral Oral  SpO2: 100% 100% 100% 100%  Weight:      Height:        Intake/Output Summary (Last 24 hours) at 02/06/2018 1119 Last data filed at 02/06/2018 0930 Gross per 24 hour  Intake 0 ml  Output -  Net 0 ml   Filed Weights   02/05/18 1755  Weight: 45.4 kg    Examination: General exam: Appears calm and comfortable ,Not in obvious distress, thinly built HEENT:PERRL,Oral mucosa moist, Ear/Nose normal on gross exam Respiratory system: Bilateral equal air entry, normal vesicular breath sounds, no wheezes or crackles  Cardiovascular system: S1 & S2 heard, RRR. No JVD, murmurs, rubs, gallops or clicks. No pedal edema. Gastrointestinal system: Abdomen is nondistended, soft. No organomegaly or masses felt. Normal bowel sounds heard.  Peritoneal dialysis catheter site is clean.  Mild tenderness and guarding over the right lower quadrant.  . MSK: Normal muscle bulk,tone ,power Extremities: No edema, no clubbing ,no cyanosis, distal peripheral pulses palpable. Skin: No rashes, lesions or ulcers,no icterus ,no pallor Psychiatry: Judgement and insight appear normal. Mood & affect appropriate.  Central nervous system: Alert and oriented. No focal neurological deficits.  Data Reviewed: I have personally reviewed following labs and imaging studies  CBC: Recent Labs  Lab  02/05/18 1814 02/06/18 0017  WBC 4.7 4.2  HGB 13.9 14.2  HCT 44.2 44.1  MCV 95.1 93.6  PLT 275 962   Basic Metabolic Panel: Recent Labs  Lab 02/05/18 1814 02/06/18 0017  NA 139 141  K 2.7* 2.9*  CL 98 98  CO2 29 29  GLUCOSE 128* 94  BUN 29*  31*  CREATININE 4.21* 4.15*  CALCIUM 9.1 9.0  MG  --  1.6*  PHOS  --  3.4   GFR: Estimated Creatinine Clearance: 8.5 mL/min (A) (by C-G formula based on SCr of 4.15 mg/dL (H)). Liver Function Tests: Recent Labs  Lab 02/05/18 1814 02/06/18 0017  AST 34 35  ALT 21 23  ALKPHOS 87 83  BILITOT 0.8 0.5  PROT 6.6 6.7  ALBUMIN 3.2* 3.1*   Recent Labs  Lab 02/05/18 1814  LIPASE 40   No results for input(s): AMMONIA in the last 168 hours. Coagulation Profile: No results for input(s): INR, PROTIME in the last 168 hours. Cardiac Enzymes: No results for input(s): CKTOTAL, CKMB, CKMBINDEX, TROPONINI in the last 168 hours. BNP (last 3 results) No results for input(s): PROBNP in the last 8760 hours. HbA1C: No results for input(s): HGBA1C in the last 72 hours. CBG: No results for input(s): GLUCAP in the last 168 hours. Lipid Profile: No results for input(s): CHOL, HDL, LDLCALC, TRIG, CHOLHDL, LDLDIRECT in the last 72 hours. Thyroid Function Tests: Recent Labs    02/06/18 0017  TSH 3.549   Anemia Panel: No results for input(s): VITAMINB12, FOLATE, FERRITIN, TIBC, IRON, RETICCTPCT in the last 72 hours. Sepsis Labs: Recent Labs  Lab 02/05/18 2129  LATICACIDVEN 0.47*    Recent Results (from the past 240 hour(s))  Body fluid culture     Status: None (Preliminary result)   Collection Time: 02/05/18 10:10 PM  Result Value Ref Range Status   Specimen Description PERITONEAL CAVITY  Final   Special Requests Normal  Final   Gram Stain   Final    WBC PRESENT,BOTH PMN AND MONONUCLEAR NO ORGANISMS SEEN CYTOSPIN SMEAR    Culture   Final    NO GROWTH < 12 HOURS Performed at [facility], [place] [address] [place], [place] [address]    Report Status PENDING  Incomplete  Blood culture (routine x 2)     Status: None (Preliminary result)   Collection Time: 02/06/18 12:05 AM  Result Value Ref Range Status   Specimen Description BLOOD RIGHT ANTECUBITAL  Final   Special Requests    Final    BOTTLES DRAWN AEROBIC ONLY Blood Culture adequate volume   Culture   Final    NO GROWTH < 12 HOURS Performed at [facility], [place] [address] [place], [place] [address]    Report Status PENDING  Incomplete  Blood culture (routine x 2)     Status: None (Preliminary result)   Collection Time: 02/06/18 12:10 AM  Result Value Ref Range Status   Specimen Description BLOOD RIGHT ANTECUBITAL  Final   Special Requests   Final    BOTTLES DRAWN AEROBIC ONLY Blood Culture adequate volume   Culture   Final    NO GROWTH < 12 HOURS Performed at [facility], [place] [address], [place], [place] [address]    Report Status PENDING  Incomplete     Radiology Studies: Ct Abdomen Pelvis Wo Contrast  Result Date: 02/05/2018 CLINICAL DATA:  Right lower quadrant abdominal pain for 1 week. No fever or vomiting. No change  in bowel habits. Undergoing peritoneal dialysis. Her last dialysis was last night. EXAM: CT ABDOMEN AND PELVIS WITHOUT CONTRAST TECHNIQUE: Multidetector CT imaging of the abdomen and pelvis was performed following the standard protocol without IV contrast. COMPARISON:  PET-CT dated 09/12/2016. Chest, abdomen and pelvis CT dated 08/22/2008. FINDINGS: Lower chest: Moderate-sized hiatal hernia with an air-fluid level. Stable elevation of the left hemidiaphragm. Hepatobiliary: Multiple small noncalcified gallstones in the dependent portion of the gallbladder, measuring up to 2-3 mm in diameter each. No gallbladder wall thickening or pericholecystic fluid. Pancreas: Unremarkable. No pancreatic ductal dilatation or surrounding inflammatory changes. Spleen: Normal in size without focal abnormality. Adrenals/Urinary Tract: Adrenal glands are unremarkable. Kidneys are normal, without renal calculi, focal lesion, or hydronephrosis. Bladder is unremarkable. Stomach/Bowel: Moderate-sized hiatal hernia. Unremarkable small bowel and colon. No evidence of appendicitis. Vascular/Lymphatic: Mild  atheromatous arterial calcifications. No enlarged lymph nodes. Reproductive: Status post hysterectomy. No adnexal masses. Other: Small to moderate amount of free peritoneal air and small amount of free peritoneal fluid. Peritoneal dialysis catheter. Musculoskeletal: Mid and lower lumbar spine facet degenerative changes. IMPRESSION: 1. Small to moderate amount of free peritoneal air and small amount of free peritoneal fluid. This is most likely due to the patient's recent peritoneal dialysis. However, correlation with the clinical findings is necessary to exclude the possibility of bowel perforation. This will be called to the referring clinician's office. 2. No evidence of appendicitis. 3. Cholelithiasis without evidence of acute cholecystitis. 4. Moderate-sized hiatal hernia. Electronically Signed   By: Claudie Revering M.D.   On: 02/05/2018 16:37    Scheduled Meds: Continuous Infusions: . piperacillin-tazobactam (ZOSYN)  IV 2.25 g (02/06/18 0612)     LOS: 1 day   Time spent: More than 50% of that time was spent in counseling and/or coordination of care.  Flora Lipps, MD [facility] Pager [phone]  If 7PM-7AM, please contact night-coverage [email or web address] Password [email or web address] 02/06/2018, 11:19 AM

## 2018-02-06 NOTE — Consult Note (Addendum)
[name] Renal Consultation Note    Indication for Consultation:  Management of ESRD/hemodialysis; anemia, hypertension/volume and secondary hyperparathyroidism  HPI: [name] is a 72 y.o. 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 On CCPD since August 2018. Primary nephrologist: Joelyn Oms.   [name] is admitted for evaluation of RLQ abdominal pain x 1 week. She was instructed to go the ED after PCP ordered Abd CT that showed small to moderate amount of free peritoneal air and small amount of free peritoneal fluid concerning for perforation. Blood cultures, PD fluid collected to evaluate for peritonitis. Empiric Zosyn started. Labs stable: Na 141, K 2.9, BUN 31, Cr 4.15, WBC 4.2, Hgb 14.1  Seen in room. Reports constant RLQ pain x1 week, thought she pulled a muscle at first. Pain meds providing some relief. Denies fevers, chills, SOB, CP, N/V/D. No cloudy effluent seen at home.    CCPD 7x/week.  2 exchanges. No day dwell   Past Medical History:  Diagnosis Date  . Acute kidney injury ([place]) 07/20/2016  . Allergic rhinitis   . Anxiety   . CAP (community acquired pneumonia) 06/2016   [name] 06/19/2016  . Cervical disc disease    Archie Endo 07/20/2016  . Chest pain at rest 06/18/2012  . COPD (chronic obstructive pulmonary disease) ([place]) 05/23/2012  . Diverticulosis   . DJD (degenerative joint disease)   . Esophageal diverticulum   . Esophageal stricture   . GERD (gastroesophageal reflux disease)   . Hiatal hernia   . History of blood transfusion 06/2016   "when I was hospitalized w/sepsis"  . Hyperlipidemia 05/23/2012   T. Chol 234, LDL 130   . Hypertension   . IBS (irritable bowel syndrome)   . Lung nodules    hx  with extensive workup including lung biopsy ruling out Sjogren's disease/notes 07/20/2016  . Osteopenia   . Recurrent sinusitis    [name] 07/20/2016  . Renal insufficiency   . Sinus headache    "daily lately" (07/20/2016)  . Traumatic arthritis     Past Surgical History:  Procedure Laterality Date  . ANTERIOR CERVICAL DECOMP/DISCECTOMY FUSION  2005  . BACK SURGERY    . COLONOSCOPY  2001   Archie Endo 09/21/2010  . CYSTECTOMY Bilateral    hx/notes 09/21/2010  . IR GENERIC HISTORICAL  07/20/2016   IR FLUORO GUIDE CV LINE RIGHT 07/20/2016 [name], MD [facility]  . IR GENERIC HISTORICAL  07/20/2016   IR US GUIDE VASC ACCESS RIGHT 07/20/2016 [name], MD [facility]  . LUNG BIOPSY Left    left side, not cancerous, thoracotomy  . TONSILLECTOMY AND ADENOIDECTOMY     hx/notes 09/21/2010  . VAGINAL HYSTERECTOMY     hx w/oophorectomy/notes 09/21/2010   Family History  Problem Relation Age of Onset  . Throat cancer Father   . Hypertension Sister   . Thyroid disease Daughter   . Hypertension Sister        x 2  . Breast cancer Maternal Aunt   . Blindness Sister        legally blind  . Colon cancer Neg Hx   . Stomach cancer Neg Hx    Social History:  reports that she quit smoking about 32 years ago. Her smoking use included cigarettes. She has a 0.96 pack-year smoking history. She has never used smokeless tobacco. She reports that she drinks about 1.0 standard drinks of alcohol per week. She reports that she does not use drugs. Allergies  Allergen Reactions  . Ace  Inhibitors Anaphylaxis    REACTION: angioedema  . Diltiazem Anaphylaxis  . Other Anaphylaxis    Calcium Channel Blocking Agent Diltiazem Analogues  . Pneumococcal Vaccines Other (See Comments)    Red rash, Knot (sore and itches), A fever for a couple days (per pt report)  . Tetanus Toxoid Other (See Comments)    Cellulitis in arm  . Adhesive [Tape] Rash    pls use paper tape  . Latex Rash    Reaction to gloves   Prior to Admission medications   Medication Sig Start Date End Date Taking? Authorizing Provider  acetaminophen (TYLENOL) 500 MG tablet Take 1,000 mg by mouth every 6 (six) hours as needed for headache (pain).   Yes [provider]  amLODipine  (NORVASC) 10 MG tablet Take 10 mg by mouth daily.    Yes [provider]  aspirin EC 81 MG tablet Take 81 mg by mouth daily.   Yes [provider]  atorvastatin (LIPITOR) 10 MG tablet Take 1 tablet (10 mg total) by mouth daily. 10/20/15  Yes Biagio Borg, MD  Cholecalciferol (VITAMIN D) 2000 units tablet Take 4,000 Units by mouth daily.   Yes [provider]  ferric citrate (AURYXIA) 1 GM 210 MG(Fe) tablet Take 210 mg by mouth 3 (three) times daily with meals.   Yes [provider]  lactulose (CHRONULAC) 10 GM/15ML solution Take 10 g by mouth daily as needed for mild constipation.   Yes [provider]  multivitamin (RENA-VIT) TABS tablet Take 1 tablet by mouth daily.   Yes [provider]  pantoprazole (PROTONIX) 40 MG tablet Take 1 tablet (40 mg total) by mouth daily. 10/20/15  Yes Biagio Borg, MD  sodium chloride (OCEAN) 0.65 % SOLN nasal spray Place 2 sprays into both nostrils daily as needed for congestion.    Yes [provider]  torsemide (DEMADEX) 20 MG tablet Take 40 mg by mouth daily. 01/03/18  Yes [provider]  traMADol (ULTRAM) 50 MG tablet Take 50 mg by mouth every 6 (six) hours as needed (pain).   Yes [provider]  bisacodyl (DULCOLAX) 10 MG suppository Place 1 suppository (10 mg total) rectally daily as needed for moderate constipation. Patient not taking: Reported on 02/05/2018 08/14/16   Modena Jansky, MD  HYDROcodone-acetaminophen (NORCO/VICODIN) 5-325 MG tablet Take 1 tablet by mouth every 8 (eight) hours as needed for moderate pain or severe pain. Patient not taking: Reported on 02/05/2018 11/09/17   Jaynee Eagles, PA-C  polyethylene glycol [facility] / Floria Raveling) packet Take 17 g by mouth daily. Patient not taking: Reported on 02/05/2018 08/15/16   Modena Jansky, MD   Current Facility-Administered Medications  Medication Dose Route Frequency Provider Last Rate Last Dose  . acetaminophen (TYLENOL)  tablet 650 mg  650 mg Oral Q6H PRN Toy Baker, MD       Or  . acetaminophen (TYLENOL) suppository 650 mg  650 mg Rectal Q6H PRN Doutova, Anastassia, MD      . HYDROcodone-acetaminophen (NORCO/VICODIN) 5-325 MG per tablet 1-2 tablet  1-2 tablet Oral Q4H PRN Doutova, Anastassia, MD      . ondansetron (ZOFRAN) tablet 4 mg  4 mg Oral Q6H PRN Doutova, Anastassia, MD       Or  . ondansetron (ZOFRAN) injection 4 mg  4 mg Intravenous Q6H PRN Doutova, Anastassia, MD      . piperacillin-tazobactam (ZOSYN) IVPB 2.25 g  2.25 g Intravenous Q6H Doutova, Anastassia, MD 100 mL/hr at 02/06/18  0612 2.25 g at 02/06/18 0612    ROS: As per HPI otherwise negative.  Physical Exam: Vitals:   02/05/18 2300 02/06/18 0018 02/06/18 0514 02/06/18 0940  BP: (!) 152/72 (!) 137/57 128/60 120/72  Pulse: 76 62 (!) 57 62  Resp: 16 20 20 18   Temp:  98.3 F (36.8 C) 98.4 F (36.9 C) 98.2 F (36.8 C)  TempSrc:  Oral Oral Oral  SpO2: 100% 100% 100% 100%  Weight:      Height:         General: Elderly female pleasant NAD  Head: NCAT sclera not icteric MMM Neck: Supple. No JVD No masses Lungs: CTAB  Heart: RRR with S1 S2 Abdomen:  Mild tenderness RLQ: No guarding or rebound tenderness; PD cath in place dsg clean  Lower extremities: No LE edema  Neuro: A & O  X 3. Moves all extremities spontaneously. Psych:  Responds to questions appropriately with a normal affect. Dialysis Access: PD cath   Labs: Basic Metabolic Panel: Recent Labs  Lab 02/05/18 1814 02/06/18 0017  NA 139 141  K 2.7* 2.9*  CL 98 98  CO2 29 29  GLUCOSE 128* 94  BUN 29* 31*  CREATININE 4.21* 4.15*  CALCIUM 9.1 9.0  PHOS  --  3.4   Liver Function Tests: Recent Labs  Lab 02/05/18 1814 02/06/18 0017  AST 34 35  ALT 21 23  ALKPHOS 87 83  BILITOT 0.8 0.5  PROT 6.6 6.7  ALBUMIN 3.2* 3.1*   Recent Labs  Lab 02/05/18 1814  LIPASE 40   No results for input(s): AMMONIA in the last 168 hours. CBC: Recent Labs  Lab  02/05/18 1814 02/06/18 0017  WBC 4.7 4.2  HGB 13.9 14.2  HCT 44.2 44.1  MCV 95.1 93.6  PLT 275 251   Cardiac Enzymes: No results for input(s): CKTOTAL, CKMB, CKMBINDEX, TROPONINI in the last 168 hours. CBG: No results for input(s): GLUCAP in the last 168 hours. Iron Studies: No results for input(s): IRON, TIBC, TRANSFERRIN, FERRITIN in the last 72 hours. Studies/Results: Ct Abdomen Pelvis Wo Contrast  Result Date: 02/05/2018 CLINICAL DATA:  Right lower quadrant abdominal pain for 1 week. No fever or vomiting. No change in bowel habits. Undergoing peritoneal dialysis. Her last dialysis was last night. EXAM: CT ABDOMEN AND PELVIS WITHOUT CONTRAST TECHNIQUE: Multidetector CT imaging of the abdomen and pelvis was performed following the standard protocol without IV contrast. COMPARISON:  PET-CT dated 09/12/2016. Chest, abdomen and pelvis CT dated 08/22/2008. FINDINGS: Lower chest: Moderate-sized hiatal hernia with an air-fluid level. Stable elevation of the left hemidiaphragm. Hepatobiliary: Multiple small noncalcified gallstones in the dependent portion of the gallbladder, measuring up to 2-3 mm in diameter each. No gallbladder wall thickening or pericholecystic fluid. Pancreas: Unremarkable. No pancreatic ductal dilatation or surrounding inflammatory changes. Spleen: Normal in size without focal abnormality. Adrenals/Urinary Tract: Adrenal glands are unremarkable. Kidneys are normal, without renal calculi, focal lesion, or hydronephrosis. Bladder is unremarkable. Stomach/Bowel: Moderate-sized hiatal hernia. Unremarkable small bowel and colon. No evidence of appendicitis. Vascular/Lymphatic: Mild atheromatous arterial calcifications. No enlarged lymph nodes. Reproductive: Status post hysterectomy. No adnexal masses. Other: Small to moderate amount of free peritoneal air and small amount of free peritoneal fluid. Peritoneal dialysis catheter. Musculoskeletal: Mid and lower lumbar spine facet  degenerative changes. IMPRESSION: 1. Small to moderate amount of free peritoneal air and small amount of free peritoneal fluid. This is most likely due to the patient's recent peritoneal dialysis. However, correlation with the clinical findings is  necessary to exclude the possibility of bowel perforation. This will be called to the referring clinician's office. 2. No evidence of appendicitis. 3. Cholelithiasis without evidence of acute cholecystitis. 4. Moderate-sized hiatal hernia. Electronically Signed   By: Claudie Revering M.D.   On: 02/05/2018 16:37    Dialysis Orders:  CCPD 7x/week 2 exchanges Dwell time 1.5h; No pause. No day dwell EDW 48kg   Assessment/Plan: 1. RLQ Abd pain -- Abd CT with small amount peritoneal free air --Seen with Dr. Jonnie Finner who notes this is common finding with PD patients. Evaluated by surgery --No surgical indications.  No signs of overt infection. Blood/PD fluid cultures pending  PD patient will often have "free air" on xrays that is secondary to the PD procedure.  Pt's fluid has been clear, culture sent yest from ED. Doubt peritonitis, but have ordered cell counts to be taken today (if still here).  Stable for dc from renal standpoint. 2. ESRD -  CCPD. Will write orders for PD if remains in hospital overnight  3. Hypertension/volume  - BP/volume stable. No overload on exam  4. Anemia  - Hgb 14.2 No ESA needs  5. Metabolic bone disease -  Ca/Phos ok. Continue 6. Nutrition - Renal diet/vitamins/N[place]ro    Lynnda Child PA-C [name] Pager [phone] 02/06/2018, 11:19 AM     Pt seen, examined, agree w assess/plan as above with additions as indicated.  Kelly Splinter MD [name] pager [phone]    cell [phone] 02/06/2018, 1:03 PM

## 2018-02-06 NOTE — Consult Note (Signed)
Reason for Consult:free air Referring Physician: Dr. Toy Baker  [name] is an 72 y.o. female.  HPI: This is a 72 year old female with end-stage renal disease who is on peritoneal dialysis.  She presented to her primary care today for a routine visit.  She reported a one-week history of right-sided abdominal pain.  A CT scan was ordered and after results were known showing possible free air and free fluid, she was sent to the emergency department.  She reports the gradual onset of right lower quadrant and suprapubic abdominal pain which she described as mild to moderate and sharp.  It is persistent.  It does not refer anywhere else.  She has been constipated.  She is eating and denies nausea or vomiting.  She reports that the PD catheter has been working well.  She does still void and denies dysuria.  She denies fevers or chills.  She has no previous history of similar abdominal pain area.  She has no previous history of diverticulitis  Past Medical History:  Diagnosis Date  . Acute kidney injury ([place]) 07/20/2016  . Allergic rhinitis   . Anxiety   . CAP (community acquired pneumonia) 06/2016   [name] 06/19/2016  . Cervical disc disease    Archie Endo 07/20/2016  . Chest pain at rest 06/18/2012  . COPD (chronic obstructive pulmonary disease) ([place]) 05/23/2012  . Diverticulosis   . DJD (degenerative joint disease)   . Esophageal diverticulum   . Esophageal stricture   . GERD (gastroesophageal reflux disease)   . Hiatal hernia   . History of blood transfusion 06/2016   "when I was hospitalized w/sepsis"  . Hyperlipidemia 05/23/2012   T. Chol 234, LDL 130   . Hypertension   . IBS (irritable bowel syndrome)   . Lung nodules    hx  with extensive workup including lung biopsy ruling out Sjogren's disease/notes 07/20/2016  . Osteopenia   . Recurrent sinusitis    [name] 07/20/2016  . Renal insufficiency   . Sinus headache    "daily lately" (07/20/2016)  . Traumatic arthritis      Past Surgical History:  Procedure Laterality Date  . ANTERIOR CERVICAL DECOMP/DISCECTOMY FUSION  2005  . BACK SURGERY    . COLONOSCOPY  2001   Archie Endo 09/21/2010  . CYSTECTOMY Bilateral    hx/notes 09/21/2010  . IR GENERIC HISTORICAL  07/20/2016   IR FLUORO GUIDE CV LINE RIGHT 07/20/2016 [name], MD [facility]  . IR GENERIC HISTORICAL  07/20/2016   IR US GUIDE VASC ACCESS RIGHT 07/20/2016 [name], MD [facility]  . LUNG BIOPSY Left    left side, not cancerous, thoracotomy  . TONSILLECTOMY AND ADENOIDECTOMY     hx/notes 09/21/2010  . VAGINAL HYSTERECTOMY     hx w/oophorectomy/notes 09/21/2010    Family History  Problem Relation Age of Onset  . Throat cancer Father   . Hypertension Sister   . Thyroid disease Daughter   . Hypertension Sister        x 2  . Breast cancer Maternal Aunt   . Blindness Sister        legally blind  . Colon cancer Neg Hx   . Stomach cancer Neg Hx     Social History:  reports that she quit smoking about 32 years ago. Her smoking use included cigarettes. She has a 0.96 pack-year smoking history. She has never used smokeless tobacco. She reports that she drinks about 1.0 standard drinks of alcohol per week. She reports that  she does not use drugs.  Allergies:  Allergies  Allergen Reactions  . Ace Inhibitors Anaphylaxis    REACTION: angioedema  . Diltiazem Anaphylaxis  . Other Anaphylaxis    Calcium Channel Blocking Agent Diltiazem Analogues  . Pneumococcal Vaccines Other (See Comments)    Red rash, Knot (sore and itches), A fever for a couple days (per pt report)  . Tetanus Toxoid Other (See Comments)    Cellulitis in arm  . Adhesive [Tape] Rash    pls use paper tape  . Latex Rash    Reaction to gloves    Medications: I have reviewed the patient's current medications.  Results for orders placed or performed during the hospital encounter of 02/05/18 (from the past 48 hour(s))  Lipase, blood     Status: None   Collection Time:  02/05/18  6:14 PM  Result Value Ref Range   Lipase 40 11 - 51 U/L    Comment: Performed at [facility], [place] [address], [place], [place] [address]  Comprehensive metabolic panel     Status: Abnormal   Collection Time: 02/05/18  6:14 PM  Result Value Ref Range   Sodium 139 135 - 145 mmol/L   Potassium 2.7 (LL) 3.5 - 5.1 mmol/L    Comment: REPEATED TO VERIFY CRITICAL RESULT CALLED TO, READ BACK BY AND VERIFIED WITH: K CARTER,RN 2002 02/05/2018 WBOND    Chloride 98 98 - 111 mmol/L   CO2 29 22 - 32 mmol/L   Glucose, Bld 128 (H) 70 - 99 mg/dL   BUN 29 (H) 8 - 23 mg/dL   Creatinine, Ser 4.21 (H) 0.44 - 1.00 mg/dL   Calcium 9.1 8.9 - 10.3 mg/dL   Total Protein 6.6 6.5 - 8.1 g/dL   Albumin 3.2 (L) 3.5 - 5.0 g/dL   AST 34 15 - 41 U/L   ALT 21 0 - 44 U/L   Alkaline Phosphatase 87 38 - 126 U/L   Total Bilirubin 0.8 0.3 - 1.2 mg/dL   GFR calc non Af Amer 10 (L) >60 mL/min   GFR calc Af Amer 11 (L) >60 mL/min    Comment: (NOTE) The eGFR has been calculated using the CKD EPI equation. This calculation has not been validated in all clinical situations. eGFR's persistently <60 mL/min signify possible Chronic Kidney Disease.    Anion gap 12 5 - 15    Comment: Performed at [facility] [address], [place], [place] [address]  CBC     Status: None   Collection Time: 02/05/18  6:14 PM  Result Value Ref Range   WBC 4.7 4.0 - 10.5 K/uL   RBC 4.65 3.87 - 5.11 MIL/uL   Hemoglobin 13.9 12.0 - 15.0 g/dL   HCT 44.2 36.0 - 46.0 %   MCV 95.1 78.0 - 100.0 fL   MCH 29.9 26.0 - 34.0 pg   MCHC 31.4 30.0 - 36.0 g/dL   RDW 13.0 11.5 - 15.5 %   Platelets 275 150 - 400 K/uL    Comment: Performed at [facility], [place] [address] [place], [place] [address]  I-Stat CG4 Lactic Acid, ED     Status: Abnormal   Collection Time: 02/05/18  9:29 PM  Result Value Ref Range   Lactic Acid, Venous 0.47 (L) 0.5 - 1.9 mmol/L  Urinalysis, Routine w reflex microscopic     Status: Abnormal    Collection Time: 02/05/18 10:04 PM  Result Value Ref Range   Color, Urine  YELLOW YELLOW   APPearance CLEAR CLEAR   Specific Gravity, Urine 1.008 1.005 - 1.030   pH 6.0 5.0 - 8.0   Glucose, UA NEGATIVE NEGATIVE mg/dL   Hgb urine dipstick SMALL (A) NEGATIVE   Bilirubin Urine NEGATIVE NEGATIVE   Ketones, ur NEGATIVE NEGATIVE mg/dL   Protein, ur NEGATIVE NEGATIVE mg/dL   Nitrite NEGATIVE NEGATIVE   Leukocytes, UA MODERATE (A) NEGATIVE   RBC / HPF 0-5 0 - 5 RBC/hpf   WBC, UA 6-10 0 - 5 WBC/hpf   Bacteria, UA RARE (A) NONE SEEN   Squamous Epithelial / LPF 0-5 0 - 5   Mucus PRESENT     Comment: Performed at [facility], [place] [address], [place], [place] [address]    Ct Abdomen Pelvis Wo Contrast  Result Date: 02/05/2018 CLINICAL DATA:  Right lower quadrant abdominal pain for 1 week. No fever or vomiting. No change in bowel habits. Undergoing peritoneal dialysis. Her last dialysis was last night. EXAM: CT ABDOMEN AND PELVIS WITHOUT CONTRAST TECHNIQUE: Multidetector CT imaging of the abdomen and pelvis was performed following the standard protocol without IV contrast. COMPARISON:  PET-CT dated 09/12/2016. Chest, abdomen and pelvis CT dated 08/22/2008. FINDINGS: Lower chest: Moderate-sized hiatal hernia with an air-fluid level. Stable elevation of the left hemidiaphragm. Hepatobiliary: Multiple small noncalcified gallstones in the dependent portion of the gallbladder, measuring up to 2-3 mm in diameter each. No gallbladder wall thickening or pericholecystic fluid. Pancreas: Unremarkable. No pancreatic ductal dilatation or surrounding inflammatory changes. Spleen: Normal in size without focal abnormality. Adrenals/Urinary Tract: Adrenal glands are unremarkable. Kidneys are normal, without renal calculi, focal lesion, or hydronephrosis. Bladder is unremarkable. Stomach/Bowel: Moderate-sized hiatal hernia. Unremarkable small bowel and colon. No evidence of appendicitis. Vascular/Lymphatic: Mild  atheromatous arterial calcifications. No enlarged lymph nodes. Reproductive: Status post hysterectomy. No adnexal masses. Other: Small to moderate amount of free peritoneal air and small amount of free peritoneal fluid. Peritoneal dialysis catheter. Musculoskeletal: Mid and lower lumbar spine facet degenerative changes. IMPRESSION: 1. Small to moderate amount of free peritoneal air and small amount of free peritoneal fluid. This is most likely due to the patient's recent peritoneal dialysis. However, correlation with the clinical findings is necessary to exclude the possibility of bowel perforation. This will be called to the referring clinician's office. 2. No evidence of appendicitis. 3. Cholelithiasis without evidence of acute cholecystitis. 4. Moderate-sized hiatal hernia. Electronically Signed   By: Claudie Revering M.D.   On: 02/05/2018 16:37    Review of Systems  Constitutional: Negative for chills and fever.  Respiratory: Negative for shortness of breath.   Cardiovascular: Negative for chest pain.  Gastrointestinal: Positive for abdominal pain and constipation. Negative for nausea and vomiting.  Genitourinary: Negative for dysuria.  All other systems reviewed and are negative.  Blood pressure (!) 137/57, pulse 62, temperature 98.3 F (36.8 C), temperature source Oral, resp. rate 20, height '5\' 3"'  (1.6 m), weight 45.4 kg, SpO2 100 %. Physical Exam  Constitutional: She appears well-developed and well-nourished. No distress.  HENT:  Head: Normocephalic and atraumatic.  Left Ear: External ear normal.  Nose: Nose normal.  Mouth/Throat: No oropharyngeal exudate.  Eyes: Pupils are equal, round, and reactive to light. Right eye exhibits no discharge. Left eye exhibits no discharge. No scleral icterus.  Neck: Normal range of motion. Neck supple. No tracheal deviation present.  Cardiovascular: Normal rate, regular rhythm, normal heart sounds and intact distal pulses.  No murmur heard. Respiratory:  Effort normal and breath sounds  normal. She exhibits no tenderness.  GI: Soft.  Her abdomen is soft and nondistended.  There is no frank peritonitis.  Her PD catheter site is clean.  There is some focal tenderness with guarding in the right lower quadrant.  There are no hernias.  Lymphadenopathy:    She has no cervical adenopathy.  Skin: She is not diaphoretic.    Assessment/Plan: Right lower quadrant and suprapubic abdominal pain of uncertain etiology  I have reviewed the CT scan.  She certainly does have free air and some free fluid but she has no frank peritonitis to suggest there is a bowel perforation.  Her WBC and lactic acid level are normal.  Her vital signs are normal and she clinically looks well.  This may be air introduced during her fluid exchanges from the peritoneal dialysis.  She is currently been admitted and is on IV antibiotics and is n.p.o.  If her pain and tenderness persist, she will need a CT scan with at least oral contrast to see if a cause of her pain can be identified.  We will follow her closely with you.  Kamoni Depree A 02/06/2018, 12:52 AM

## 2018-02-06 NOTE — Progress Notes (Addendum)
Patient ID: [name], female   DOB: [birth year], 72 y.o.   MRN: [id number]       Subjective: Patient still with some RLQ pain this morning, but asking if she is going to get to go home today.  She is hungry.  Objective: Vital signs in last 24 hours: Temp:  [98.2 F (36.8 C)-98.4 F (36.9 C)] 98.4 F (36.9 C) (10/01 0514) Pulse Rate:  [57-77] 57 (10/01 0514) Resp:  [16-20] 20 (10/01 0514) BP: (126-157)/(57-90) 128/60 (10/01 0514) SpO2:  [100 %] 100 % (10/01 0514) Weight:  [45.4 kg] 45.4 kg (09/30 1755)    Intake/Output from previous day: No intake/output data recorded. Intake/Output this shift: No intake/output data recorded.  PE: Abd: soft, still tender in RLQ, but no peritoneal signs, PD cath in LUQ, +BS, ND  Lab Results:  Recent Labs    02/05/18 1814 02/06/18 0017  WBC 4.7 4.2  HGB 13.9 14.2  HCT 44.2 44.1  PLT 275 251   BMET Recent Labs    02/05/18 1814 02/06/18 0017  NA 139 141  K 2.7* 2.9*  CL 98 98  CO2 29 29  GLUCOSE 128* 94  BUN 29* 31*  CREATININE 4.21* 4.15*  CALCIUM 9.1 9.0   PT/INR No results for input(s): LABPROT, INR in the last 72 hours. CMP     Component Value Date/Time   NA 141 02/06/2018 0017   K 2.9 (L) 02/06/2018 0017   CL 98 02/06/2018 0017   CO2 29 02/06/2018 0017   GLUCOSE 94 02/06/2018 0017   BUN 31 (H) 02/06/2018 0017   CREATININE 4.15 (H) 02/06/2018 0017   CALCIUM 9.0 02/06/2018 0017   PROT 6.7 02/06/2018 0017   ALBUMIN 3.1 (L) 02/06/2018 0017   AST 35 02/06/2018 0017   ALT 23 02/06/2018 0017   ALKPHOS 83 02/06/2018 0017   BILITOT 0.5 02/06/2018 0017   GFRNONAA 10 (L) 02/06/2018 0017   GFRAA 11 (L) 02/06/2018 0017   Lipase     Component Value Date/Time   LIPASE 40 02/05/2018 1814       Studies/Results: Ct Abdomen Pelvis Wo Contrast  Result Date: 02/05/2018 CLINICAL DATA:  Right lower quadrant abdominal pain for 1 week. No fever or vomiting. No change in bowel habits. Undergoing peritoneal dialysis.  Her last dialysis was last night. EXAM: CT ABDOMEN AND PELVIS WITHOUT CONTRAST TECHNIQUE: Multidetector CT imaging of the abdomen and pelvis was performed following the standard protocol without IV contrast. COMPARISON:  PET-CT dated 09/12/2016. Chest, abdomen and pelvis CT dated 08/22/2008. FINDINGS: Lower chest: Moderate-sized hiatal hernia with an air-fluid level. Stable elevation of the left hemidiaphragm. Hepatobiliary: Multiple small noncalcified gallstones in the dependent portion of the gallbladder, measuring up to 2-3 mm in diameter each. No gallbladder wall thickening or pericholecystic fluid. Pancreas: Unremarkable. No pancreatic ductal dilatation or surrounding inflammatory changes. Spleen: Normal in size without focal abnormality. Adrenals/Urinary Tract: Adrenal glands are unremarkable. Kidneys are normal, without renal calculi, focal lesion, or hydronephrosis. Bladder is unremarkable. Stomach/Bowel: Moderate-sized hiatal hernia. Unremarkable small bowel and colon. No evidence of appendicitis. Vascular/Lymphatic: Mild atheromatous arterial calcifications. No enlarged lymph nodes. Reproductive: Status post hysterectomy. No adnexal masses. Other: Small to moderate amount of free peritoneal air and small amount of free peritoneal fluid. Peritoneal dialysis catheter. Musculoskeletal: Mid and lower lumbar spine facet degenerative changes. IMPRESSION: 1. Small to moderate amount of free peritoneal air and small amount of free peritoneal fluid. This is most likely due to the patient's recent peritoneal dialysis.  However, correlation with the clinical findings is necessary to exclude the possibility of bowel perforation. This will be called to the referring clinician's office. 2. No evidence of appendicitis. 3. Cholelithiasis without evidence of acute cholecystitis. 4. Moderate-sized hiatal hernia. Electronically Signed   By: Claudie Revering M.D.   On: 02/05/2018 16:37    Anti-infectives: Anti-infectives (From  admission, onward)   Start     Dose/Rate Route Frequency Ordered Stop   02/06/18 0600  piperacillin-tazobactam (ZOSYN) IVPB 2.25 g     2.25 g 100 mL/hr over 30 Minutes Intravenous Every 6 hours 02/05/18 2339     02/05/18 2315  piperacillin-tazobactam (ZOSYN) IVPB 4.5 g  Status:  Discontinued     4.5 g 200 mL/hr over 30 Minutes Intravenous  Once 02/05/18 2302 02/05/18 2305   02/05/18 2315  piperacillin-tazobactam (ZOSYN) IVPB 3.375 g     3.375 g 100 mL/hr over 30 Minutes Intravenous  Once 02/05/18 2305 02/05/18 2356   02/05/18 2230  cefTRIAXone (ROCEPHIN) 2 g in sodium chloride 0.9 % 100 mL IVPB  Status:  Discontinued     2 g 200 mL/hr over 30 Minutes Intravenous Every 24 hours 02/05/18 2216 02/05/18 2302       Assessment/Plan Right lower quadrant and suprapubic abdominal pain of uncertain etiology -still with some abdominal tenderness, but vitals and labs are normal. -will allow clear liquids.  Hold on repeat CT scan at this time given her stability and see how she does. -await labs from abdominal fluid etc to rule out infection, however, body fluid cx is negative. -still difficult given how nontoxic she appears to say that the free air isn't likely from her PD, but will continue to follow.  FEN - clear liquids VTE - SCDS/ ok for prophylaxis from our standpoint ID - [email or web address]   LOS: 1 day    Henreitta Cea , [facility] 02/06/2018, 9:34 AM Pager: [phone]

## 2018-02-06 NOTE — Progress Notes (Signed)
Initial Nutrition Assessment  DOCUMENTATION CODES:   Severe malnutrition in context of chronic illness, Underweight  INTERVENTION:   Add Renal MVI  Snacks TID between meals  Recommend REGULAR diet once advanced   NUTRITION DIAGNOSIS:   Severe Malnutrition related to chronic illness(ESRD on PD) as evidenced by severe fat depletion, severe muscle depletion, percent weight loss.  GOAL:   Patient will meet greater than or equal to 90% of their needs  MONITOR:   PO intake, Labs, Weight trends  REASON FOR ASSESSMENT:   Rounds    ASSESSMENT:    72 yo female presents with RLQ abdominal pain x 1 week; PMH includes ESRD on CCPD (2 exchanges, no day dwell) since August 2017, COPD, esophageal stricture/diverticulum, GERD, hiatal hernia, HLD, HTN, IBS  CT abdomen with small amount of peritoneal free air, no surgical intervention at present  Pt reports she is hungry at present, diet just advanced to CL but had not received a tray, requesting some hot tea. Pt reports her appetite has been poor since prior to initiation of HD with loss of taste, anorexia. Pt indicates she is supposed to be eating 3 meals plus 2 snacks per day; however, has only been eating 2 meals and 1 snack as of late. Snack is usually Peanut butter crackers, nuts and/or fruit. Not very specific about meal content. Pt indicates   Pt does not take oral nutrition supplements at home; pt was taking Adkins shake but is sick of it. Pt does not like Boost, Ensure or Nepro. Discussed alternative options for protein/calorie supplement at home including protein bars, protein powder, etc; pt reports she has tried some but is open to trying others. Reinforced importance of smaller, more frequent meals to optimize po intake with protein source at each meal  Pt reports she has been tolerating PD PTA; pt reports CCPD only last for total of 4 hours, 2 dwells. Pt reports she takes a phosphorus binder and oral Vitamin D at home, in  addition of Rena-Vite. Pt does not need binder at present  Pt reports dry weight trending down, current wt 100 pounds, down from 105. Previously 110 pounds. Pt reports prior to initiation of dialysis last year, pt weighed 160 pounds. >35% wt loss in 1-2 years which is significant for time frame.   Pt with 3 unmeasured urine occurrences today, pt with no edema on exam  Labs: potassium 2.9 (L), phosphorus 3.4 (wdl), magnesium 1.6 Meds: magox, KCl  NUTRITION - FOCUSED PHYSICAL EXAM:    Most Recent Value  Orbital Region  Moderate depletion  Upper Arm Region  Severe depletion  Thoracic and Lumbar Region  Severe depletion  Buccal Region  Mild depletion  Temple Region  Moderate depletion  Clavicle Bone Region  Severe depletion  Clavicle and Acromion Bone Region  Severe depletion  Scapular Bone Region  Severe depletion  Dorsal Hand  Mild depletion  Patellar Region  Unable to assess [wearing pants]  Anterior Thigh Region  Moderate depletion  Posterior Calf Region  Severe depletion  Edema (RD Assessment)  None       Diet Order:   Diet Order            Diet full liquid Room service appropriate? Yes; Fluid consistency: Thin  Diet effective now              EDUCATION NEEDS:   Education needs have been addressed  Skin:  Skin Assessment: Reviewed RN Assessment  Last BM:  9/29  Height:  Ht Readings from Last 1 Encounters:  02/05/18 5\' 3"  (1.6 m)    Weight:   Wt Readings from Last 1 Encounters:  02/05/18 45.4 kg    Ideal Body Weight:  52.3 kg  BMI:  Body mass index is 17.71 kg/m.  Estimated Nutritional Needs:   Kcal:  1600-1800 kcals   Protein:  80-90 g   Fluid:  1.5 L   Kerman Passey MS, RD, LDN, CNSC [phone] Pager  [phone] Weekend/On-Call Pager

## 2018-02-07 DIAGNOSIS — Z992 Dependence on renal dialysis: Secondary | ICD-10-CM

## 2018-02-07 DIAGNOSIS — K659 Peritonitis, unspecified: Secondary | ICD-10-CM

## 2018-02-07 DIAGNOSIS — E43 Unspecified severe protein-calorie malnutrition: Secondary | ICD-10-CM

## 2018-02-07 DIAGNOSIS — N186 End stage renal disease: Secondary | ICD-10-CM

## 2018-02-07 LAB — CBC
HCT: 36.2 % (ref 36.0–46.0)
Hemoglobin: 11.6 g/dL — ABNORMAL LOW (ref 12.0–15.0)
MCH: 29.6 pg (ref 26.0–34.0)
MCHC: 32 g/dL (ref 30.0–36.0)
MCV: 92.3 fL (ref 78.0–100.0)
PLATELETS: 222 10*3/uL (ref 150–400)
RBC: 3.92 MIL/uL (ref 3.87–5.11)
RDW: 13.1 % (ref 11.5–15.5)
WBC: 5.3 10*3/uL (ref 4.0–10.5)

## 2018-02-07 LAB — URINE CULTURE

## 2018-02-07 LAB — MAGNESIUM: MAGNESIUM: 1.6 mg/dL — AB (ref 1.7–2.4)

## 2018-02-07 MED ORDER — ATORVASTATIN CALCIUM 10 MG PO TABS
10.0000 mg | ORAL_TABLET | Freq: Every day | ORAL | Status: DC
  Administered 2018-02-07 – 2018-02-08 (×2): 10 mg via ORAL
  Filled 2018-02-07 (×2): qty 1

## 2018-02-07 MED ORDER — VITAMIN D 1000 UNITS PO TABS
2000.0000 [IU] | ORAL_TABLET | Freq: Every day | ORAL | Status: DC
  Administered 2018-02-07 – 2018-02-08 (×2): 2000 [IU] via ORAL
  Filled 2018-02-07 (×2): qty 2

## 2018-02-07 MED ORDER — AMLODIPINE BESYLATE 10 MG PO TABS
10.0000 mg | ORAL_TABLET | Freq: Every day | ORAL | Status: DC
  Administered 2018-02-07 – 2018-02-08 (×2): 10 mg via ORAL
  Filled 2018-02-07 (×2): qty 1

## 2018-02-07 MED ORDER — VANCOMYCIN HCL IN DEXTROSE 1-5 GM/200ML-% IV SOLN
1000.0000 mg | Freq: Once | INTRAVENOUS | Status: AC
  Administered 2018-02-07: 1000 mg via INTRAVENOUS
  Filled 2018-02-07: qty 200

## 2018-02-07 MED ORDER — PANTOPRAZOLE SODIUM 40 MG PO TBEC
40.0000 mg | DELAYED_RELEASE_TABLET | Freq: Every day | ORAL | Status: DC
  Administered 2018-02-07 – 2018-02-08 (×2): 40 mg via ORAL
  Filled 2018-02-07 (×2): qty 1

## 2018-02-07 NOTE — Progress Notes (Signed)
  PROGRESS NOTE  DORINNE GRAEFF JOI:[id number] DOB: 1946/03/11 DOA: 02/05/2018 PCP: Merrilee Seashore, MD  Brief Narrative: 72 year old woman PMH ESRD on peritoneal dialysis was sent to the emergency department after outpatient imaging showed free peritoneal fluid and air.  Admitted for concern for peritonitis.  Assessment/Plan Peritonitis secondary to peritoneal dialysis catheter --Continue vancomycin pending culture data  ESRD treated with peritoneal dialysis --Continue management per nephrology  Severe protein calorie malnutrition, underweight --Management per dietitian  DVT prophylaxis: SCDs Code Status: full Family Communication: none Disposition Plan: home    Murray Hodgkins, MD  [facility] Direct contact: [phone] --Via amion app OR  --[email or web address]; password [email or web address]  7PM-7AM contact night coverage as above 02/07/2018, 7:44 PM  LOS: 2 days   Consultants:  General surgery  Nephrology  Procedures:    Antimicrobials:  Vancomycin 10/2 >  Interval history/Subjective: Feels better.  Still has some abdominal pain.  Objective: Vitals:  Vitals:   02/07/18 1155 02/07/18 1719  BP: 135/80 120/65  Pulse: (!) 59 66  Resp: 17 18  Temp: 99.3 F (37.4 C) 98.3 F (36.8 C)  SpO2: 98% 100%    Exam:  Constitutional:  . Appears calm and comfortable Respiratory:  . CTA bilaterally, no w/r/r.  . Respiratory effort normal.  Cardiovascular:  . RRR, no m/r/g . No LE extremity edema   Abdomen:  . Pain with palpitation, firm Psychiatric:  . Mental status o Mood, affect appropriate  I have personally reviewed the following:   Data: . Magnesium 1.6, CBC unremarkable  Scheduled Meds: . amLODipine  10 mg Oral Daily  . atorvastatin  10 mg Oral q1800  . cholecalciferol  2,000 Units Oral q1800  . gentamicin cream  1 application Topical Daily  . magnesium oxide  400 mg Oral BID  . multivitamin  1 tablet Oral QHS  . pantoprazole  40 mg Oral Daily    Continuous Infusions: . dialysis solution 1.5% low-MG/low-CA    . dialysis solution 2.5% low-MG/low-CA      Principal Problem:   Peritonitis ([place]) Active Problems:   ESRD (end stage renal disease) on dialysis ([place])   Protein-calorie malnutrition, severe   LOS: 2 days

## 2018-02-07 NOTE — Progress Notes (Signed)
Patient ID: [name], female   DOB: [birth year], 72 y.o.   MRN: [id number]       Subjective: Pt states she feels better today.  Ate full liquids yesterday.    Objective: Vital signs in last 24 hours: Temp:  [98.2 F (36.8 C)-100.2 F (37.9 C)] 100.2 F (37.9 C) (10/02 0506) Pulse Rate:  [59-66] 60 (10/02 0506) Resp:  [16-18] 18 (10/02 0506) BP: (120-156)/(62-74) 156/63 (10/02 0506) SpO2:  [98 %-100 %] 98 % (10/02 0506) Weight:  [45.5 kg-46 kg] 45.5 kg (10/02 0132) Last BM Date: 02/04/18  Intake/Output from previous day: 10/01 0701 - 10/02 0700 In: 838.5 [P.O.:60; IV Piggyback:778.5] Out: 0  Intake/Output this shift: No intake/output data recorded.  PE: Abd: soft, still somewhat tender in RLQ, +BS, ND, PD cath in place in LUQ  Lab Results:  Recent Labs    02/06/18 0017 02/07/18 0419  WBC 4.2 5.3  HGB 14.2 11.6*  HCT 44.1 36.2  PLT 251 222   BMET Recent Labs    02/05/18 1814 02/06/18 0017  NA 139 141  K 2.7* 2.9*  CL 98 98  CO2 29 29  GLUCOSE 128* 94  BUN 29* 31*  CREATININE 4.21* 4.15*  CALCIUM 9.1 9.0   PT/INR No results for input(s): LABPROT, INR in the last 72 hours. CMP     Component Value Date/Time   NA 141 02/06/2018 0017   K 2.9 (L) 02/06/2018 0017   CL 98 02/06/2018 0017   CO2 29 02/06/2018 0017   GLUCOSE 94 02/06/2018 0017   BUN 31 (H) 02/06/2018 0017   CREATININE 4.15 (H) 02/06/2018 0017   CALCIUM 9.0 02/06/2018 0017   PROT 6.7 02/06/2018 0017   ALBUMIN 3.1 (L) 02/06/2018 0017   AST 35 02/06/2018 0017   ALT 23 02/06/2018 0017   ALKPHOS 83 02/06/2018 0017   BILITOT 0.5 02/06/2018 0017   GFRNONAA 10 (L) 02/06/2018 0017   GFRAA 11 (L) 02/06/2018 0017   Lipase     Component Value Date/Time   LIPASE 40 02/05/2018 1814       Studies/Results: Ct Abdomen Pelvis Wo Contrast  Result Date: 02/05/2018 CLINICAL DATA:  Right lower quadrant abdominal pain for 1 week. No fever or vomiting. No change in bowel habits. Undergoing  peritoneal dialysis. Her last dialysis was last night. EXAM: CT ABDOMEN AND PELVIS WITHOUT CONTRAST TECHNIQUE: Multidetector CT imaging of the abdomen and pelvis was performed following the standard protocol without IV contrast. COMPARISON:  PET-CT dated 09/12/2016. Chest, abdomen and pelvis CT dated 08/22/2008. FINDINGS: Lower chest: Moderate-sized hiatal hernia with an air-fluid level. Stable elevation of the left hemidiaphragm. Hepatobiliary: Multiple small noncalcified gallstones in the dependent portion of the gallbladder, measuring up to 2-3 mm in diameter each. No gallbladder wall thickening or pericholecystic fluid. Pancreas: Unremarkable. No pancreatic ductal dilatation or surrounding inflammatory changes. Spleen: Normal in size without focal abnormality. Adrenals/Urinary Tract: Adrenal glands are unremarkable. Kidneys are normal, without renal calculi, focal lesion, or hydronephrosis. Bladder is unremarkable. Stomach/Bowel: Moderate-sized hiatal hernia. Unremarkable small bowel and colon. No evidence of appendicitis. Vascular/Lymphatic: Mild atheromatous arterial calcifications. No enlarged lymph nodes. Reproductive: Status post hysterectomy. No adnexal masses. Other: Small to moderate amount of free peritoneal air and small amount of free peritoneal fluid. Peritoneal dialysis catheter. Musculoskeletal: Mid and lower lumbar spine facet degenerative changes. IMPRESSION: 1. Small to moderate amount of free peritoneal air and small amount of free peritoneal fluid. This is most likely due to the patient's recent peritoneal  dialysis. However, correlation with the clinical findings is necessary to exclude the possibility of bowel perforation. This will be called to the referring clinician's office. 2. No evidence of appendicitis. 3. Cholelithiasis without evidence of acute cholecystitis. 4. Moderate-sized hiatal hernia. Electronically Signed   By: Claudie Revering M.D.   On: 02/05/2018 16:37     Anti-infectives: Anti-infectives (From admission, onward)   Start     Dose/Rate Route Frequency Ordered Stop   02/06/18 0600  piperacillin-tazobactam (ZOSYN) IVPB 2.25 g     2.25 g 100 mL/hr over 30 Minutes Intravenous Every 6 hours 02/05/18 2339     02/05/18 2315  piperacillin-tazobactam (ZOSYN) IVPB 4.5 g  Status:  Discontinued     4.5 g 200 mL/hr over 30 Minutes Intravenous  Once 02/05/18 2302 02/05/18 2305   02/05/18 2315  piperacillin-tazobactam (ZOSYN) IVPB 3.375 g     3.375 g 100 mL/hr over 30 Minutes Intravenous  Once 02/05/18 2305 02/05/18 2356   02/05/18 2230  cefTRIAXone (ROCEPHIN) 2 g in sodium chloride 0.9 % 100 mL IVPB  Status:  Discontinued     2 g 200 mL/hr over 30 Minutes Intravenous Every 24 hours 02/05/18 2216 02/05/18 2302       Assessment/Plan Right lower quadrant and suprapubic abdominal pain of uncertain etiology -pain still present, but she is otherwise stable and doing well.  WBC normal, AF. -adv to renal diet -no surgical indications or etiology for pain.  Defer further care to primary service and renal service. -we will sign off  FEN - renal diet VTE - SCDS/ ok for prophylaxis from our standpoint ID - zosyn   LOS: 2 days    Henreitta Cea , [facility] 02/07/2018, 8:25 AM Pager: [phone]

## 2018-02-07 NOTE — Progress Notes (Signed)
This Probation officer has been informed by charge nurse to see patient d/t PD machine isn:t workin/pd cath line issue???Seen pt in room. Alert/oriented in no apparent distress. PD screen says drain 1 of 1 but no fluid seen in drain bag nor its been draining . By passed drain mode and now filling 1of 1 with no issue. Spoke to primary nurse and informed of above.

## 2018-02-07 NOTE — Progress Notes (Addendum)
[facility] Progress Note   Subjective:  Waiting to be disconnected from cycler Temp 100.2 overnight Says fluids "cloudy" last night  Still having abd pain, diarrhea this morning    Objective Vitals:   02/07/18 0138 02/07/18 0506 02/07/18 0800 02/07/18 0826  BP: 135/74 (!) 156/63 (!) 145/81 (!) 145/81  Pulse: 66 60 (!) 58 (!) 58  Resp: 16 18 18 18   Temp: 99.2 F (37.3 C) 100.2 F (37.9 C) 99.2 F (37.3 C) 99.2 F (37.3 C)  TempSrc: Oral Oral Oral Oral  SpO2: 100% 98%  100%  Weight:      Height:       Physical Exam General: Elderly female NAD  Heart: RRR Lungs: CTAB no rales, wheeze Abdomen: Mild tenderness RLQ No guarding or rebound  Extremities: No LE edema  Dialysis Access: PD cath connected to cycler   Dialysis Orders:  [facility] 7x/week 2 exchanges Fill vol 2L Dwell 1.5h No day dwell   Assessment/Plan: 1. RLQ Abd pain/Peritonitis  -- Abd CT 9/30 with small amount peritoneal free air concerning for perforation -- Dr. Jonnie Finner notes this is common finding with PD patients secondary to PD procedure. However, ^^ WBCs on fluid counts done yesterday. PD fluid cultures pending. On Zosyn. Would probably gram positive coverage as well.  Fluid is cloudy now, WBC in PD fluid is high (~1000) and PD fluid is growing staph species. She has acute PD cath related peritonitis.  Will dc zosyn and start IV vancomycin. She can be dc'd when GI symptoms are resolving.  2. ESRD - Continue CCPD  3. Anemia - Hgb 11.6 No ESA  4. MBD- Ca/Phos controlled. Continue Vit D/binders  5. HTN/volume - BP slightly elevated. Home Norvasc resumed today.  No volume overload  6. Nutrition - Renal diet/vitamins   Lynnda Child PA-C [facility] Pager [phone] 02/07/2018,11:53 AM  LOS: 2 days   Pt seen, examined, agree w assess/plan as above with additions as indicated.  Kelly Splinter MD [facility] pager [phone]    cell [phone] 02/07/2018, 1:23  PM     Additional Objective Labs: Basic Metabolic Panel: Recent Labs  Lab 02/05/18 1814 02/06/18 0017  NA 139 141  K 2.7* 2.9*  CL 98 98  CO2 29 29  GLUCOSE 128* 94  BUN 29* 31*  CREATININE 4.21* 4.15*  CALCIUM 9.1 9.0  PHOS  --  3.4   CBC: Recent Labs  Lab 02/05/18 1814 02/06/18 0017 02/07/18 0419  WBC 4.7 4.2 5.3  HGB 13.9 14.2 11.6*  HCT 44.2 44.1 36.2  MCV 95.1 93.6 92.3  PLT 275 251 222   Blood Culture    Component Value Date/Time   SDES BLOOD RIGHT ANTECUBITAL 02/06/2018 0010   SPECREQUEST  02/06/2018 0010    BOTTLES DRAWN AEROBIC ONLY Blood Culture adequate volume   CULT  02/06/2018 0010    NO GROWTH 1 DAY Performed at [facility], [place] [address] [place], [place] [address]    REPTSTATUS PENDING 02/06/2018 0010    Cardiac Enzymes: No results for input(s): CKTOTAL, CKMB, CKMBINDEX, TROPONINI in the last 168 hours. CBG: No results for input(s): GLUCAP in the last 168 hours. Iron Studies: No results for input(s): IRON, TIBC, TRANSFERRIN, FERRITIN in the last 72 hours. Lab Results  Component Value Date   INR 1.36 07/20/2016   INR 0.96 03/09/2009   INR 1.0 11/04/2008   Medications: . dialysis solution 1.5% low-MG/low-CA    . dialysis solution 2.5% low-MG/low-CA    .  piperacillin-tazobactam (ZOSYN)  IV 2.25 g (02/07/18 1140)   . amLODipine  10 mg Oral Daily  . atorvastatin  10 mg Oral q1800  . gentamicin cream  1 application Topical Daily  . magnesium oxide  400 mg Oral BID  . multivitamin  1 tablet Oral QHS  . pantoprazole  40 mg Oral Daily

## 2018-02-07 NOTE — Progress Notes (Signed)
Pharmacy Antibiotic Note  [name] is a 72 y.o. female admitted on 02/05/2018 with Peritonitis. Patient with WBC 1115 IP and CNS growing in cultures.  Pharmacy has been consulted for Vancomycin dosing.  Plan: Vancomycin 1000mg  IV x 1 D/C zosyn Will draw Vancomycin random levels as needed   Height: 5\' 3"  (160 cm) Weight: 102 lb 1.2 oz (46.3 kg) IBW/kg (Calculated) : 52.4  Temp (24hrs), Avg:99.3 F (37.4 C), Min:98.8 F (37.1 C), Max:100.2 F (37.9 C)  Recent Labs  Lab 02/05/18 1814 02/05/18 2129 02/06/18 0017 02/07/18 0419  WBC 4.7  --  4.2 5.3  CREATININE 4.21*  --  4.15*  --   LATICACIDVEN  --  0.47*  --   --     Estimated Creatinine Clearance: 8.7 mL/min (A) (by C-G formula based on SCr of 4.15 mg/dL (H)).    Allergies  Allergen Reactions  . Ace Inhibitors Anaphylaxis    REACTION: angioedema  . Diltiazem Anaphylaxis  . Other Anaphylaxis    Calcium Channel Blocking Agent Diltiazem Analogues  . Pneumococcal Vaccines Other (See Comments)    Red rash, Knot (sore and itches), A fever for a couple days (per pt report)  . Tetanus Toxoid Other (See Comments)    Cellulitis in arm  . Adhesive [Tape] Rash    pls use paper tape  . Latex Rash    Reaction to gloves    Antimicrobials this admission: zosyn 10/1 >> 10/2 Vanc  10/2 >>   Dose adjustments this admission: n/a  Microbiology results: 10/1 Peritoneal fluid cxs: Staph epi  Orlene Salmons A. Levada Dy, PharmD, [place] Pager: [phone] Please utilize Amion for appropriate phone number to reach the unit pharmacist ([place])    02/07/2018 1:52 PM

## 2018-02-08 DIAGNOSIS — N2581 Secondary hyperparathyroidism of renal origin: Secondary | ICD-10-CM | POA: Diagnosis not present

## 2018-02-08 DIAGNOSIS — N186 End stage renal disease: Secondary | ICD-10-CM | POA: Diagnosis not present

## 2018-02-08 LAB — PATHOLOGIST SMEAR REVIEW

## 2018-02-08 MED ORDER — POTASSIUM CHLORIDE CRYS ER 20 MEQ PO TBCR
30.0000 meq | EXTENDED_RELEASE_TABLET | Freq: Two times a day (BID) | ORAL | Status: DC
  Administered 2018-02-08: 30 meq via ORAL
  Filled 2018-02-08: qty 1

## 2018-02-08 MED ORDER — VANCOMYCIN VARIABLE DOSE PER UNSTABLE RENAL FUNCTION (PHARMACIST DOSING): Status: DC

## 2018-02-08 MED ORDER — VANCOMYCIN HCL IN DEXTROSE 1-5 GM/200ML-% IV SOLN
1000.0000 mg | Freq: Once | INTRAVENOUS | Status: AC
  Administered 2018-02-08: 1000 mg via INTRAVENOUS
  Filled 2018-02-08: qty 200

## 2018-02-08 NOTE — Progress Notes (Addendum)
[facility] Progress Note   Subjective:  Some problem with cycler last night, but feels better, wants to go home   Objective Vitals:   02/07/18 1155 02/07/18 1719 02/07/18 2137 02/08/18 0504  BP: 135/80 120/65 132/81 (!) 163/86  Pulse: (!) 59 66 62 72  Resp: 17 18 18 18   Temp: 99.3 F (37.4 C) 98.3 F (36.8 C) 98.6 F (37 C) 98.9 F (37.2 C)  TempSrc: Oral Oral Oral Oral  SpO2: 98% 100% 100% 98%  Weight: 46.3 kg  46.5 kg   Height:       Physical Exam General: Elderly female NAD  Heart: RRR Lungs: CTAB no rales, wheeze Abdomen: Mild tenderness RLQ No guarding or rebound  Extremities: No LE edema  Dialysis Access: PD cath connected to cycler   Dialysis Orders:  [facility] 7x/week 2 exchanges Fill vol 2L Dwell 1.5h No day dwell   Assessment/Plan: 1. RLQ Abd pain/Peritonitis  -- WBC in PD fluid was high (~1000) and PD fluid is growing staph epi. She has acute PD cath related peritonitis. Started IV Vanc on 10/1, today is D#3. Improved. OK for dc . Will give another 1 gm IV vanc prior to dc today and then will get IP vanc at Home PD center for total of 2 weeks. They will f/u on sensitivities too.  2. ESRD - Continue CCPD qhs.  3. Anemia - Hgb 11.6 No ESA  4. MBD- Ca/Phos controlled. Continue Vit D/binders  5. HTN/volume - BP slightly elevated. Home Norvasc resumed.   No volume overload  6. Nutrition - Renal diet/vitamins   Lynnda Child PA-C [facility] Pager [phone] 02/08/2018,9:23 AM  LOS: 3 days   Pt seen, examined and agree w A/P as above.  Kelly Splinter MD [facility] pager [phone]   02/08/2018, 11:20 AM     Additional Objective Labs: Basic Metabolic Panel: Recent Labs  Lab 02/05/18 1814 02/06/18 0017  NA 139 141  K 2.7* 2.9*  CL 98 98  CO2 29 29  GLUCOSE 128* 94  BUN 29* 31*  CREATININE 4.21* 4.15*  CALCIUM 9.1 9.0  PHOS  --  3.4   CBC: Recent Labs  Lab 02/05/18 1814 02/06/18 0017  02/07/18 0419  WBC 4.7 4.2 5.3  HGB 13.9 14.2 11.6*  HCT 44.2 44.1 36.2  MCV 95.1 93.6 92.3  PLT 275 251 222   Blood Culture    Component Value Date/Time   SDES BLOOD RIGHT ANTECUBITAL 02/06/2018 0010   SPECREQUEST  02/06/2018 0010    BOTTLES DRAWN AEROBIC ONLY Blood Culture adequate volume   CULT  02/06/2018 0010    NO GROWTH 2 DAYS Performed at [facility], [place] [address], [place], [place] [address]    REPTSTATUS PENDING 02/06/2018 0010    Cardiac Enzymes: No results for input(s): CKTOTAL, CKMB, CKMBINDEX, TROPONINI in the last 168 hours. CBG: No results for input(s): GLUCAP in the last 168 hours. Iron Studies: No results for input(s): IRON, TIBC, TRANSFERRIN, FERRITIN in the last 72 hours. Lab Results  Component Value Date   INR 1.36 07/20/2016   INR 0.96 03/09/2009   INR 1.0 11/04/2008   Medications: . dialysis solution 1.5% low-MG/low-CA    . dialysis solution 2.5% low-MG/low-CA     . amLODipine  10 mg Oral Daily  . atorvastatin  10 mg Oral q1800  . cholecalciferol  2,000 Units Oral q1800  . gentamicin cream  1 application Topical Daily  . magnesium oxide  400 mg Oral BID  . multivitamin  1 tablet Oral QHS  . pantoprazole  40 mg Oral Daily  . vancomycin variable dose per unstable renal function (pharmacist dosing)   Does not apply See admin instructions

## 2018-02-08 NOTE — Discharge Summary (Addendum)
Physician Discharge Summary  [name] QQV:[id number] DOB: [birth year] DOA: 02/05/2018  PCP: Merrilee Seashore, MD  Nephrologist: Dr. Joelyn Oms  Admit date: 02/05/2018 Discharge date: 02/08/2018  Recommendations for Outpatient Follow-up:   Peritonitis secondary to peritoneal dialysis catheter --Continue vancomycin pending culture data. Per Dr. Jonnie Finner, he will arrange with the patient's peritoneal dialysis center to obtain intraperitoneal vancomycin for the appropriate duration to be determined by the patient's nephrologist, dosing to be adjusted per protocol per nephrologist and based on sensitivities. --follow-up culture  Follow-up Information    Rexene Agent, MD. Schedule an appointment as soon as possible for a visit today.   Specialty:  Nephrology Why:  1-2 weeks Contact information: [place] [address] [phone]          Discharge Diagnoses:  1. Peritonitis secondary to peritoneal dialysis catheter 2. ESRD treated with peritoneal dialysis 3. Severe protein calorie malnutrition, underweight  Discharge Condition: improved Disposition: home   Diet recommendation: heart healthy diet  Filed Weights   02/07/18 0132 02/07/18 1155 02/07/18 2137  Weight: 45.5 kg 46.3 kg 46.5 kg    History of present illness:  72 year old woman PMH ESRD on peritoneal dialysis was sent to the emergency department after outpatient imaging showed free peritoneal fluid and air.  Admitted for concern for peritonitis.  Hospital Course:  Patient was treated with empiric antibiotics based on culture data, seen by general surgery.  Fortunately did not require operative intervention.  At this time culture growing staph epidermidis.  Sensitivities pending.  Discussed with nephrology Dr. Jonnie Finner, given her clinical improvement, he recommends discharge home on intraperitoneal vancomycin.  I discussed this with the pharmacist, nurse as well and Dr. Soyla Murphy will arrange through  the patient's peritoneal dialysis center for the patient to be treated appropriately with vancomycin and for follow-up of sensitivities.  Peritonitis secondary to peritoneal dialysis catheter --Continue vancomycin pending culture data  ESRD treated with peritoneal dialysis --Continue management per nephrology  Severe protein calorie malnutrition, underweight --Management per dietitian  Consultants:  General surgery  Nephrology  Procedures:    Antimicrobials:  Vancomycin 10/2 >  Today's assessment: S: feels better. Much less abd pain. O: Vitals:  Vitals:   02/08/18 0504 02/08/18 0939  BP: (!) 163/86 (!) 143/74  Pulse: 72 62  Resp: 18 16  Temp: 98.9 F (37.2 C) 99.2 F (37.3 C)  SpO2: 98% 100%    Constitutional:  . Appears calm and comfortable Respiratory:  . CTA bilaterally, no w/r/r.  . Respiratory effort normal.  Cardiovascular:  . RRR, no m/r/g . No LE extremity edema   Abdomen:  . Soft, mild tenderness Psychiatric:  . Mental status o Mood, affect appropriate  No labs today  Discharge Instructions  Discharge Instructions    Activity as tolerated - No restrictions   Complete by:  As directed    Diet - low sodium heart healthy   Complete by:  As directed    Discharge instructions   Complete by:  As directed    Call your physician or seek immediate medical attention for pain, fever, vomiting or worsening of condition.   Nursing communication   Complete by:  As directed    Please coordinate intraperitoneal vancomycin per HH/pharmacy.     Allergies as of 02/08/2018      Reactions   Ace Inhibitors Anaphylaxis   REACTION: angioedema   Diltiazem Anaphylaxis   Other Anaphylaxis   Calcium Channel Blocking Agent Diltiazem Analogues   Pneumococcal Vaccines Other (See Comments)  Red rash, Knot (sore and itches), A fever for a couple days (per pt report)   Tetanus Toxoid Other (See Comments)   Cellulitis in arm   Adhesive [tape] Rash   pls  use paper tape   Latex Rash   Reaction to gloves      Medication List    STOP taking these medications   bisacodyl 10 MG suppository Commonly known as:  DULCOLAX   HYDROcodone-acetaminophen 5-325 MG tablet Commonly known as:  NORCO/VICODIN   polyethylene glycol packet Commonly known as:  MIRALAX / GLYCOLAX     TAKE these medications   acetaminophen 500 MG tablet Commonly known as:  TYLENOL Take 1,000 mg by mouth every 6 (six) hours as needed for headache (pain).   amLODipine 10 MG tablet Commonly known as:  NORVASC Take 10 mg by mouth daily.   aspirin EC 81 MG tablet Take 81 mg by mouth daily.   atorvastatin 10 MG tablet Commonly known as:  LIPITOR Take 1 tablet (10 mg total) by mouth daily.   AURYXIA 1 GM 210 MG(Fe) tablet Generic drug:  ferric citrate Take 210 mg by mouth 3 (three) times daily with meals.   lactulose 10 GM/15ML solution Commonly known as:  CHRONULAC Take 10 g by mouth daily as needed for mild constipation.   multivitamin Tabs tablet Take 1 tablet by mouth daily.   pantoprazole 40 MG tablet Commonly known as:  PROTONIX Take 1 tablet (40 mg total) by mouth daily.   sodium chloride 0.65 % Soln nasal spray Commonly known as:  OCEAN Place 2 sprays into both nostrils daily as needed for congestion.   torsemide 20 MG tablet Commonly known as:  DEMADEX Take 40 mg by mouth daily.   traMADol 50 MG tablet Commonly known as:  ULTRAM Take 50 mg by mouth every 6 (six) hours as needed (pain).   Vitamin D 2000 units tablet Take 4,000 Units by mouth daily.      Allergies  Allergen Reactions  . Ace Inhibitors Anaphylaxis    REACTION: angioedema  . Diltiazem Anaphylaxis  . Other Anaphylaxis    Calcium Channel Blocking Agent Diltiazem Analogues  . Pneumococcal Vaccines Other (See Comments)    Red rash, Knot (sore and itches), A fever for a couple days (per pt report)  . Tetanus Toxoid Other (See Comments)    Cellulitis in arm  . Adhesive  [Tape] Rash    pls use paper tape  . Latex Rash    Reaction to gloves    The results of significant diagnostics from this hospitalization (including imaging, microbiology, ancillary and laboratory) are listed below for reference.    Significant Diagnostic Studies: Ct Abdomen Pelvis Wo Contrast  Result Date: 02/05/2018 CLINICAL DATA:  Right lower quadrant abdominal pain for 1 week. No fever or vomiting. No change in bowel habits. Undergoing peritoneal dialysis. Her last dialysis was last night. EXAM: CT ABDOMEN AND PELVIS WITHOUT CONTRAST TECHNIQUE: Multidetector CT imaging of the abdomen and pelvis was performed following the standard protocol without IV contrast. COMPARISON:  PET-CT dated 09/12/2016. Chest, abdomen and pelvis CT dated 08/22/2008. FINDINGS: Lower chest: Moderate-sized hiatal hernia with an air-fluid level. Stable elevation of the left hemidiaphragm. Hepatobiliary: Multiple small noncalcified gallstones in the dependent portion of the gallbladder, measuring up to 2-3 mm in diameter each. No gallbladder wall thickening or pericholecystic fluid. Pancreas: Unremarkable. No pancreatic ductal dilatation or surrounding inflammatory changes. Spleen: Normal in size without focal abnormality. Adrenals/Urinary Tract: Adrenal glands are unremarkable. Kidneys  are normal, without renal calculi, focal lesion, or hydronephrosis. Bladder is unremarkable. Stomach/Bowel: Moderate-sized hiatal hernia. Unremarkable small bowel and colon. No evidence of appendicitis. Vascular/Lymphatic: Mild atheromatous arterial calcifications. No enlarged lymph nodes. Reproductive: Status post hysterectomy. No adnexal masses. Other: Small to moderate amount of free peritoneal air and small amount of free peritoneal fluid. Peritoneal dialysis catheter. Musculoskeletal: Mid and lower lumbar spine facet degenerative changes. IMPRESSION: 1. Small to moderate amount of free peritoneal air and small amount of free peritoneal  fluid. This is most likely due to the patient's recent peritoneal dialysis. However, correlation with the clinical findings is necessary to exclude the possibility of bowel perforation. This will be called to the referring clinician's office. 2. No evidence of appendicitis. 3. Cholelithiasis without evidence of acute cholecystitis. 4. Moderate-sized hiatal hernia. Electronically Signed   By: Claudie Revering M.D.   On: 02/05/2018 16:37    Microbiology: Recent Results (from the past 240 hour(s))  Body fluid culture     Status: None (Preliminary result)   Collection Time: 02/05/18 10:10 PM  Result Value Ref Range Status   Specimen Description PERITONEAL CAVITY  Final   Special Requests Normal  Final   Gram Stain   Final    WBC PRESENT,BOTH PMN AND MONONUCLEAR NO ORGANISMS SEEN CYTOSPIN SMEAR    Culture   Final    FEW STAPHYLOCOCCUS EPIDERMIDIS SUSCEPTIBILITIES TO FOLLOW CRITICAL RESULT CALLED TO, READ BACK BY AND VERIFIED WITH: RN Edythe Lynn [phone] FCP Performed at [facility], [place] [address] [place], [place] [address]    Report Status PENDING  Incomplete  Urine culture     Status: Abnormal   Collection Time: 02/05/18 10:13 PM  Result Value Ref Range Status   Specimen Description URINE, RANDOM  Final   Special Requests   Final    NONE Performed at [facility], [place] [address], [place], [place] [address]    Culture MULTIPLE SPECIES PRESENT, SUGGEST RECOLLECTION (A)  Final   Report Status 02/07/2018 FINAL  Final  Blood culture (routine x 2)     Status: None (Preliminary result)   Collection Time: 02/06/18 12:05 AM  Result Value Ref Range Status   Specimen Description BLOOD RIGHT ANTECUBITAL  Final   Special Requests   Final    BOTTLES DRAWN AEROBIC ONLY Blood Culture adequate volume   Culture   Final    NO GROWTH 2 DAYS Performed at [facility], [place] [address], [place], [place] [address]    Report Status PENDING  Incomplete  Blood culture (routine x 2)      Status: None (Preliminary result)   Collection Time: 02/06/18 12:10 AM  Result Value Ref Range Status   Specimen Description BLOOD RIGHT ANTECUBITAL  Final   Special Requests   Final    BOTTLES DRAWN AEROBIC ONLY Blood Culture adequate volume   Culture   Final    NO GROWTH 2 DAYS Performed at [facility], [place] [address] [place], [place] [address]    Report Status PENDING  Incomplete     Labs: Basic Metabolic Panel: Recent Labs  Lab 02/05/18 1814 02/06/18 0017 02/07/18 0419  NA 139 141  --   K 2.7* 2.9*  --   CL 98 98  --   CO2 29 29  --   GLUCOSE 128* 94  --   BUN 29* 31*  --   CREATININE 4.21* 4.15*  --   CALCIUM 9.1 9.0  --  MG  --  1.6* 1.6*  PHOS  --  3.4  --    Liver Function Tests: Recent Labs  Lab 02/05/18 1814 02/06/18 0017  AST 34 35  ALT 21 23  ALKPHOS 87 83  BILITOT 0.8 0.5  PROT 6.6 6.7  ALBUMIN 3.2* 3.1*   Recent Labs  Lab 02/05/18 1814  LIPASE 40   CBC: Recent Labs  Lab 02/05/18 1814 02/06/18 0017 02/07/18 0419  WBC 4.7 4.2 5.3  HGB 13.9 14.2 11.6*  HCT 44.2 44.1 36.2  MCV 95.1 93.6 92.3  PLT 275 251 222    Principal Problem:   Peritonitis (HCC) Active Problems:   ESRD (end stage renal disease) on dialysis ([facility])   Protein-calorie malnutrition, severe   Time coordinating discharge: 35 minutes  Signed:  Murray Hodgkins, MD [facility] 02/08/2018, 4:26 PM

## 2018-02-08 NOTE — Progress Notes (Signed)
Patient ready for discharge from unit to home. Family to provided transportation to home.  All discharge information reviewed with patient. Diet, medications and med compliance reviewed along with follow up appts.  All personal belongings with patient. Patient displays no s/sx of distress at  This time.

## 2018-02-08 NOTE — Care Management Important Message (Signed)
Important Message  Patient Details  Name: [name] MRN: [id number] Date of Birth: 1945-12-14   Medicare Important Message Given:  Yes    Orbie Pyo 02/08/2018, 3:19 PM

## 2018-02-09 DIAGNOSIS — N2581 Secondary hyperparathyroidism of renal origin: Secondary | ICD-10-CM | POA: Diagnosis not present

## 2018-02-09 DIAGNOSIS — N186 End stage renal disease: Secondary | ICD-10-CM | POA: Diagnosis not present

## 2018-02-09 LAB — BODY FLUID CULTURE: Special Requests: NORMAL

## 2018-02-10 DIAGNOSIS — N2581 Secondary hyperparathyroidism of renal origin: Secondary | ICD-10-CM | POA: Diagnosis not present

## 2018-02-10 DIAGNOSIS — N186 End stage renal disease: Secondary | ICD-10-CM | POA: Diagnosis not present

## 2018-02-11 DIAGNOSIS — N186 End stage renal disease: Secondary | ICD-10-CM | POA: Diagnosis not present

## 2018-02-11 DIAGNOSIS — N2581 Secondary hyperparathyroidism of renal origin: Secondary | ICD-10-CM | POA: Diagnosis not present

## 2018-02-11 LAB — CULTURE, BLOOD (ROUTINE X 2)
CULTURE: NO GROWTH
Culture: NO GROWTH
SPECIAL REQUESTS: ADEQUATE
SPECIAL REQUESTS: ADEQUATE

## 2018-02-12 DIAGNOSIS — N2581 Secondary hyperparathyroidism of renal origin: Secondary | ICD-10-CM | POA: Diagnosis not present

## 2018-02-12 DIAGNOSIS — N186 End stage renal disease: Secondary | ICD-10-CM | POA: Diagnosis not present

## 2018-02-13 DIAGNOSIS — N186 End stage renal disease: Secondary | ICD-10-CM | POA: Diagnosis not present

## 2018-02-13 DIAGNOSIS — N2581 Secondary hyperparathyroidism of renal origin: Secondary | ICD-10-CM | POA: Diagnosis not present

## 2018-02-14 DIAGNOSIS — N186 End stage renal disease: Secondary | ICD-10-CM | POA: Diagnosis not present

## 2018-02-14 DIAGNOSIS — N2581 Secondary hyperparathyroidism of renal origin: Secondary | ICD-10-CM | POA: Diagnosis not present

## 2018-02-15 DIAGNOSIS — N2581 Secondary hyperparathyroidism of renal origin: Secondary | ICD-10-CM | POA: Diagnosis not present

## 2018-02-15 DIAGNOSIS — N186 End stage renal disease: Secondary | ICD-10-CM | POA: Diagnosis not present

## 2018-02-16 DIAGNOSIS — N2581 Secondary hyperparathyroidism of renal origin: Secondary | ICD-10-CM | POA: Diagnosis not present

## 2018-02-16 DIAGNOSIS — N186 End stage renal disease: Secondary | ICD-10-CM | POA: Diagnosis not present

## 2018-02-17 DIAGNOSIS — N186 End stage renal disease: Secondary | ICD-10-CM | POA: Diagnosis not present

## 2018-02-17 DIAGNOSIS — N2581 Secondary hyperparathyroidism of renal origin: Secondary | ICD-10-CM | POA: Diagnosis not present

## 2018-02-18 DIAGNOSIS — N2581 Secondary hyperparathyroidism of renal origin: Secondary | ICD-10-CM | POA: Diagnosis not present

## 2018-02-18 DIAGNOSIS — N186 End stage renal disease: Secondary | ICD-10-CM | POA: Diagnosis not present

## 2018-02-19 DIAGNOSIS — N2581 Secondary hyperparathyroidism of renal origin: Secondary | ICD-10-CM | POA: Diagnosis not present

## 2018-02-19 DIAGNOSIS — N186 End stage renal disease: Secondary | ICD-10-CM | POA: Diagnosis not present

## 2018-02-20 DIAGNOSIS — N2581 Secondary hyperparathyroidism of renal origin: Secondary | ICD-10-CM | POA: Diagnosis not present

## 2018-02-20 DIAGNOSIS — N186 End stage renal disease: Secondary | ICD-10-CM | POA: Diagnosis not present

## 2018-02-21 DIAGNOSIS — N2581 Secondary hyperparathyroidism of renal origin: Secondary | ICD-10-CM | POA: Diagnosis not present

## 2018-02-21 DIAGNOSIS — N186 End stage renal disease: Secondary | ICD-10-CM | POA: Diagnosis not present

## 2018-02-22 DIAGNOSIS — N2581 Secondary hyperparathyroidism of renal origin: Secondary | ICD-10-CM | POA: Diagnosis not present

## 2018-02-22 DIAGNOSIS — N186 End stage renal disease: Secondary | ICD-10-CM | POA: Diagnosis not present

## 2018-02-23 DIAGNOSIS — N2581 Secondary hyperparathyroidism of renal origin: Secondary | ICD-10-CM | POA: Diagnosis not present

## 2018-02-23 DIAGNOSIS — N186 End stage renal disease: Secondary | ICD-10-CM | POA: Diagnosis not present

## 2018-02-24 DIAGNOSIS — N2581 Secondary hyperparathyroidism of renal origin: Secondary | ICD-10-CM | POA: Diagnosis not present

## 2018-02-24 DIAGNOSIS — N186 End stage renal disease: Secondary | ICD-10-CM | POA: Diagnosis not present

## 2018-02-25 DIAGNOSIS — N186 End stage renal disease: Secondary | ICD-10-CM | POA: Diagnosis not present

## 2018-02-25 DIAGNOSIS — N2581 Secondary hyperparathyroidism of renal origin: Secondary | ICD-10-CM | POA: Diagnosis not present

## 2018-02-26 DIAGNOSIS — N2581 Secondary hyperparathyroidism of renal origin: Secondary | ICD-10-CM | POA: Diagnosis not present

## 2018-02-26 DIAGNOSIS — N186 End stage renal disease: Secondary | ICD-10-CM | POA: Diagnosis not present

## 2018-02-27 DIAGNOSIS — N2581 Secondary hyperparathyroidism of renal origin: Secondary | ICD-10-CM | POA: Diagnosis not present

## 2018-02-27 DIAGNOSIS — N186 End stage renal disease: Secondary | ICD-10-CM | POA: Diagnosis not present

## 2018-02-28 DIAGNOSIS — N186 End stage renal disease: Secondary | ICD-10-CM | POA: Diagnosis not present

## 2018-02-28 DIAGNOSIS — N2581 Secondary hyperparathyroidism of renal origin: Secondary | ICD-10-CM | POA: Diagnosis not present

## 2018-03-01 DIAGNOSIS — N186 End stage renal disease: Secondary | ICD-10-CM | POA: Diagnosis not present

## 2018-03-01 DIAGNOSIS — N2581 Secondary hyperparathyroidism of renal origin: Secondary | ICD-10-CM | POA: Diagnosis not present

## 2018-03-02 DIAGNOSIS — N2581 Secondary hyperparathyroidism of renal origin: Secondary | ICD-10-CM | POA: Diagnosis not present

## 2018-03-02 DIAGNOSIS — N186 End stage renal disease: Secondary | ICD-10-CM | POA: Diagnosis not present

## 2018-03-03 DIAGNOSIS — N2581 Secondary hyperparathyroidism of renal origin: Secondary | ICD-10-CM | POA: Diagnosis not present

## 2018-03-03 DIAGNOSIS — N186 End stage renal disease: Secondary | ICD-10-CM | POA: Diagnosis not present

## 2018-03-04 DIAGNOSIS — N186 End stage renal disease: Secondary | ICD-10-CM | POA: Diagnosis not present

## 2018-03-04 DIAGNOSIS — N2581 Secondary hyperparathyroidism of renal origin: Secondary | ICD-10-CM | POA: Diagnosis not present

## 2018-03-05 DIAGNOSIS — N2581 Secondary hyperparathyroidism of renal origin: Secondary | ICD-10-CM | POA: Diagnosis not present

## 2018-03-05 DIAGNOSIS — N186 End stage renal disease: Secondary | ICD-10-CM | POA: Diagnosis not present

## 2018-03-06 DIAGNOSIS — N186 End stage renal disease: Secondary | ICD-10-CM | POA: Diagnosis not present

## 2018-03-06 DIAGNOSIS — N2581 Secondary hyperparathyroidism of renal origin: Secondary | ICD-10-CM | POA: Diagnosis not present

## 2018-03-07 DIAGNOSIS — Z90710 Acquired absence of both cervix and uterus: Secondary | ICD-10-CM | POA: Diagnosis not present

## 2018-03-07 DIAGNOSIS — Z0000 Encounter for general adult medical examination without abnormal findings: Secondary | ICD-10-CM | POA: Diagnosis not present

## 2018-03-07 DIAGNOSIS — N059 Unspecified nephritic syndrome with unspecified morphologic changes: Secondary | ICD-10-CM | POA: Diagnosis not present

## 2018-03-07 DIAGNOSIS — N186 End stage renal disease: Secondary | ICD-10-CM | POA: Diagnosis not present

## 2018-03-07 DIAGNOSIS — I7789 Other specified disorders of arteries and arterioles: Secondary | ICD-10-CM | POA: Diagnosis not present

## 2018-03-07 DIAGNOSIS — N2581 Secondary hyperparathyroidism of renal origin: Secondary | ICD-10-CM | POA: Diagnosis not present

## 2018-03-07 DIAGNOSIS — I776 Arteritis, unspecified: Secondary | ICD-10-CM | POA: Diagnosis not present

## 2018-03-07 DIAGNOSIS — Z992 Dependence on renal dialysis: Secondary | ICD-10-CM | POA: Diagnosis not present

## 2018-03-07 DIAGNOSIS — Z008 Encounter for other general examination: Secondary | ICD-10-CM | POA: Diagnosis not present

## 2018-03-07 DIAGNOSIS — I10 Essential (primary) hypertension: Secondary | ICD-10-CM | POA: Diagnosis not present

## 2018-03-08 DIAGNOSIS — N186 End stage renal disease: Secondary | ICD-10-CM | POA: Diagnosis not present

## 2018-03-08 DIAGNOSIS — Z992 Dependence on renal dialysis: Secondary | ICD-10-CM | POA: Diagnosis not present

## 2018-03-08 DIAGNOSIS — N2581 Secondary hyperparathyroidism of renal origin: Secondary | ICD-10-CM | POA: Diagnosis not present

## 2018-03-08 DIAGNOSIS — I120 Hypertensive chronic kidney disease with stage 5 chronic kidney disease or end stage renal disease: Secondary | ICD-10-CM | POA: Diagnosis not present

## 2018-03-29 ENCOUNTER — Other Ambulatory Visit: Payer: Self-pay | Admitting: Internal Medicine

## 2018-03-29 DIAGNOSIS — Z1231 Encounter for screening mammogram for malignant neoplasm of breast: Secondary | ICD-10-CM

## 2018-03-29 DIAGNOSIS — M810 Age-related osteoporosis without current pathological fracture: Secondary | ICD-10-CM

## 2018-05-15 DIAGNOSIS — N059 Unspecified nephritic syndrome with unspecified morphologic changes: Secondary | ICD-10-CM | POA: Insufficient documentation

## 2018-05-15 DIAGNOSIS — K449 Diaphragmatic hernia without obstruction or gangrene: Secondary | ICD-10-CM | POA: Insufficient documentation

## 2018-05-15 DIAGNOSIS — Z01818 Encounter for other preprocedural examination: Secondary | ICD-10-CM | POA: Insufficient documentation

## 2018-05-15 DIAGNOSIS — E7800 Pure hypercholesterolemia, unspecified: Secondary | ICD-10-CM | POA: Insufficient documentation

## 2018-05-29 ENCOUNTER — Other Ambulatory Visit: Payer: Self-pay | Admitting: Internal Medicine

## 2018-05-29 DIAGNOSIS — Z780 Asymptomatic menopausal state: Secondary | ICD-10-CM

## 2018-05-30 ENCOUNTER — Ambulatory Visit
Admission: RE | Admit: 2018-05-30 | Discharge: 2018-05-30 | Disposition: A | Discharge status: Home / Self Care | Payer: Medicare HMO | Source: Ambulatory Visit | Attending: Internal Medicine | Admitting: Internal Medicine

## 2018-05-30 DIAGNOSIS — Z780 Asymptomatic menopausal state: Secondary | ICD-10-CM

## 2018-05-30 DIAGNOSIS — Z1231 Encounter for screening mammogram for malignant neoplasm of breast: Secondary | ICD-10-CM

## 2018-06-04 ENCOUNTER — Other Ambulatory Visit: Payer: Self-pay | Admitting: Nephrology

## 2018-06-04 ENCOUNTER — Ambulatory Visit
Admission: RE | Admit: 2018-06-04 | Discharge: 2018-06-04 | Disposition: A | Discharge status: Home / Self Care | Payer: Medicare HMO | Source: Ambulatory Visit | Attending: Nephrology | Admitting: Nephrology

## 2018-06-04 DIAGNOSIS — T85611D Breakdown (mechanical) of intraperitoneal dialysis catheter, subsequent encounter: Secondary | ICD-10-CM

## 2018-06-06 ENCOUNTER — Other Ambulatory Visit: Payer: Self-pay | Admitting: Nephrology

## 2018-06-06 ENCOUNTER — Other Ambulatory Visit: Payer: Medicare HMO

## 2018-06-06 DIAGNOSIS — N186 End stage renal disease: Secondary | ICD-10-CM

## 2018-06-07 ENCOUNTER — Other Ambulatory Visit: Payer: Self-pay | Admitting: Nephrology

## 2018-06-07 ENCOUNTER — Ambulatory Visit
Admission: RE | Admit: 2018-06-07 | Discharge: 2018-06-07 | Disposition: A | Discharge status: Home / Self Care | Payer: Medicare HMO | Source: Ambulatory Visit | Attending: Nephrology | Admitting: Nephrology

## 2018-06-07 DIAGNOSIS — N186 End stage renal disease: Secondary | ICD-10-CM

## 2018-06-07 DIAGNOSIS — T85691S Other mechanical complication of intraperitoneal dialysis catheter, sequela: Secondary | ICD-10-CM

## 2018-06-11 DIAGNOSIS — K219 Gastro-esophageal reflux disease without esophagitis: Secondary | ICD-10-CM | POA: Insufficient documentation

## 2018-06-11 DIAGNOSIS — M6281 Muscle weakness (generalized): Secondary | ICD-10-CM | POA: Insufficient documentation

## 2018-06-18 ENCOUNTER — Other Ambulatory Visit: Payer: Self-pay | Admitting: Nephrology

## 2018-06-18 DIAGNOSIS — N186 End stage renal disease: Secondary | ICD-10-CM

## 2018-10-04 DIAGNOSIS — D509 Iron deficiency anemia, unspecified: Secondary | ICD-10-CM | POA: Insufficient documentation

## 2018-10-04 DIAGNOSIS — I129 Hypertensive chronic kidney disease with stage 1 through stage 4 chronic kidney disease, or unspecified chronic kidney disease: Secondary | ICD-10-CM | POA: Diagnosis present

## 2018-10-04 DIAGNOSIS — N189 Chronic kidney disease, unspecified: Secondary | ICD-10-CM | POA: Insufficient documentation

## 2018-10-04 DIAGNOSIS — D689 Coagulation defect, unspecified: Secondary | ICD-10-CM | POA: Insufficient documentation

## 2018-12-14 DIAGNOSIS — N186 End stage renal disease: Secondary | ICD-10-CM | POA: Insufficient documentation

## 2018-12-14 DIAGNOSIS — Z992 Dependence on renal dialysis: Secondary | ICD-10-CM | POA: Insufficient documentation

## 2019-01-20 DIAGNOSIS — E878 Other disorders of electrolyte and fluid balance, not elsewhere classified: Secondary | ICD-10-CM | POA: Insufficient documentation

## 2019-01-20 DIAGNOSIS — E872 Acidosis, unspecified: Secondary | ICD-10-CM | POA: Insufficient documentation

## 2019-01-20 DIAGNOSIS — D513 Other dietary vitamin B12 deficiency anemia: Secondary | ICD-10-CM | POA: Insufficient documentation

## 2019-06-25 ENCOUNTER — Emergency Department (HOSPITAL_COMMUNITY)
Admission: EM | Admit: 2019-06-25 | Discharge: 2019-06-25 | Disposition: A | Discharge status: Home / Self Care | Payer: Medicare HMO | Attending: Emergency Medicine | Admitting: Emergency Medicine

## 2019-06-25 ENCOUNTER — Encounter (HOSPITAL_COMMUNITY): Payer: Self-pay | Admitting: Emergency Medicine

## 2019-06-25 ENCOUNTER — Encounter (HOSPITAL_COMMUNITY): Payer: Self-pay

## 2019-06-25 ENCOUNTER — Other Ambulatory Visit: Payer: Self-pay

## 2019-06-25 ENCOUNTER — Ambulatory Visit (HOSPITAL_COMMUNITY)
Admission: EM | Admit: 2019-06-25 | Discharge: 2019-06-25 | Disposition: A | Discharge status: Home / Self Care | Payer: Medicare HMO | Source: Home / Self Care

## 2019-06-25 ENCOUNTER — Emergency Department (HOSPITAL_COMMUNITY): Payer: Medicare HMO

## 2019-06-25 DIAGNOSIS — R002 Palpitations: Secondary | ICD-10-CM | POA: Diagnosis present

## 2019-06-25 DIAGNOSIS — I951 Orthostatic hypotension: Secondary | ICD-10-CM | POA: Diagnosis not present

## 2019-06-25 DIAGNOSIS — Z992 Dependence on renal dialysis: Secondary | ICD-10-CM | POA: Diagnosis not present

## 2019-06-25 DIAGNOSIS — N186 End stage renal disease: Secondary | ICD-10-CM | POA: Diagnosis not present

## 2019-06-25 DIAGNOSIS — Z91040 Latex allergy status: Secondary | ICD-10-CM | POA: Insufficient documentation

## 2019-06-25 DIAGNOSIS — Z7982 Long term (current) use of aspirin: Secondary | ICD-10-CM | POA: Insufficient documentation

## 2019-06-25 DIAGNOSIS — J449 Chronic obstructive pulmonary disease, unspecified: Secondary | ICD-10-CM | POA: Insufficient documentation

## 2019-06-25 DIAGNOSIS — Z87891 Personal history of nicotine dependence: Secondary | ICD-10-CM | POA: Diagnosis not present

## 2019-06-25 DIAGNOSIS — Z79899 Other long term (current) drug therapy: Secondary | ICD-10-CM | POA: Insufficient documentation

## 2019-06-25 DIAGNOSIS — R0602 Shortness of breath: Secondary | ICD-10-CM | POA: Diagnosis not present

## 2019-06-25 DIAGNOSIS — I120 Hypertensive chronic kidney disease with stage 5 chronic kidney disease or end stage renal disease: Secondary | ICD-10-CM | POA: Insufficient documentation

## 2019-06-25 LAB — URINALYSIS, ROUTINE W REFLEX MICROSCOPIC
Bacteria, UA: NONE SEEN
Bilirubin Urine: NEGATIVE
Glucose, UA: NEGATIVE mg/dL
Hgb urine dipstick: NEGATIVE
Ketones, ur: NEGATIVE mg/dL
Nitrite: NEGATIVE
Protein, ur: NEGATIVE mg/dL
Specific Gravity, Urine: 1.016 (ref 1.005–1.030)
pH: 5 (ref 5.0–8.0)

## 2019-06-25 LAB — BASIC METABOLIC PANEL
Anion gap: 19 — ABNORMAL HIGH (ref 5–15)
BUN: 55 mg/dL — ABNORMAL HIGH (ref 8–23)
CO2: 23 mmol/L (ref 22–32)
Calcium: 9.6 mg/dL (ref 8.9–10.3)
Chloride: 94 mmol/L — ABNORMAL LOW (ref 98–111)
Creatinine, Ser: 11.38 mg/dL — ABNORMAL HIGH (ref 0.44–1.00)
GFR calc Af Amer: 3 mL/min — ABNORMAL LOW (ref >60)
GFR calc non Af Amer: 3 mL/min — ABNORMAL LOW (ref >60)
Glucose, Bld: 124 mg/dL — ABNORMAL HIGH (ref 70–99)
Potassium: 4.1 mmol/L (ref 3.5–5.1)
Sodium: 136 mmol/L (ref 135–145)

## 2019-06-25 LAB — BRAIN NATRIURETIC PEPTIDE: B Natriuretic Peptide: 38.4 pg/mL (ref 0.0–100.0)

## 2019-06-25 LAB — CBC
HCT: 44.1 % (ref 36.0–46.0)
Hemoglobin: 14.6 g/dL (ref 12.0–15.0)
MCH: 31.9 pg (ref 26.0–34.0)
MCHC: 33.1 g/dL (ref 30.0–36.0)
MCV: 96.3 fL (ref 80.0–100.0)
Platelets: 265 10*3/uL (ref 150–400)
RBC: 4.58 MIL/uL (ref 3.87–5.11)
RDW: 11.6 % (ref 11.5–15.5)
WBC: 6.2 10*3/uL (ref 4.0–10.5)
nRBC: 0 % (ref 0.0–0.2)

## 2019-06-25 LAB — TROPONIN I (HIGH SENSITIVITY)
Troponin I (High Sensitivity): 30 ng/L — ABNORMAL HIGH (ref <18)
Troponin I (High Sensitivity): 31 ng/L — ABNORMAL HIGH (ref <18)

## 2019-06-25 MED ORDER — SODIUM CHLORIDE 0.9 % IV SOLN: INTRAVENOUS | Status: AC

## 2019-06-25 NOTE — Discharge Instructions (Signed)
Your testing today was reassuring.  Please drink the appropriate amount of clear liquids, continue your home dialysis however if you continue to have weakness or new or worsening weakness, feel like you are going to pass out or become short of breath or have chest pain please come back to the emergency department immediately.

## 2019-06-25 NOTE — ED Notes (Signed)
Patient is being discharged from the Urgent [facility] and sent to the Emergency Department via wheelchair by staff. Per dr Meda Coffee, patient is stable but in need of higher level of care due to EKG changes. Patient is aware and verbalizes understanding of plan of care.  Vitals:   06/25/19 1310  BP: 111/75  Pulse: (!) 107  Resp: 20  Temp: 99 F (37.2 C)  SpO2: 99%

## 2019-06-25 NOTE — ED Triage Notes (Signed)
Recent sinus infection.  Noticed yesterday palpitations and weakness with ambulation

## 2019-06-25 NOTE — ED Provider Notes (Signed)
Patient here with weakness and palpitations She is ESRD on peritoneal dialysis Hypertension Hyperlipidemia  EKG shows sinus tache cardia with l hemi block and NST changes, poss old :MI.  This is changed from prior  I recommend evaluation in the ER Albin Felling, MD 06/25/19 1314

## 2019-06-25 NOTE — ED Notes (Signed)
Gave pt water, tolerating well at this time.

## 2019-06-25 NOTE — ED Provider Notes (Signed)
[facility] EMERGENCY DEPARTMENT Provider Note   CSN: [id number] Arrival date & time: 06/25/19  1320     History Chief Complaint  Patient presents with  . Palpitations  . Shortness of Breath    SISILIA BROOKES is a 74 y.o. female.  HPI    This patient is a 74 year old female, she has a history of end-stage renal disease currently on peritoneal dialysis, she has a known history of lung nodules, recurrent sinusitis and COPD.  She presents to the hospital from the urgent care after she presented for a complaint of palpitations and shortness of breath.  The patient was noted to have some possible EKG changes on the EKG and thus was sent to the emergency department for further evaluation.  Additional history was obtained from the patient and the medical record, she has been on peritoneal dialysis for approximately 1 year, she still makes urine approximately 4 times per day.  She has been in generally mildly poor health over the last week and has been taking amoxicillin for sinusitis.  She reports this has not helped and she continues to have postnasal drip, nasal congestion and rhinitis.  Over the last 24 hours she has noted some increasing palpitations which she states occur when she stands up feeling like her heart is racing.  This is associated with shortness of breath.  Both of these symptoms resolved when the patient sits down and rests.  She denies fevers or chills and denies any coughing.  She denies chest pain back pain or swelling of the legs.  She has no abdominal pain or constipation.  The patient states that she has had significantly decreased oral intake over the last several days because of her postnasal drip and poor appetite.  The patient is asymptomatic at the time of my exam.  Past Medical History:  Diagnosis Date  . Acute kidney injury ([place]) 07/20/2016  . Allergic rhinitis   . Anxiety   . CAP (community acquired pneumonia) 06/2016   [name] 06/19/2016  .  Cervical disc disease    Archie Endo 07/20/2016  . Chest pain at rest 06/18/2012  . COPD (chronic obstructive pulmonary disease) ([place]) 05/23/2012  . Diverticulosis   . DJD (degenerative joint disease)   . Esophageal diverticulum   . Esophageal stricture   . GERD (gastroesophageal reflux disease)   . Hiatal hernia   . History of blood transfusion 06/2016   "when I was hospitalized w/sepsis"  . Hyperlipidemia 05/23/2012   T. Chol 234, LDL 130   . Hypertension   . IBS (irritable bowel syndrome)   . Lung nodules    hx  with extensive workup including lung biopsy ruling out Sjogren's disease/notes 07/20/2016  . Osteopenia   . Recurrent sinusitis    [name] 07/20/2016  . Renal insufficiency   . Sinus headache    "daily lately" (07/20/2016)  . Traumatic arthritis     Patient Active Problem List   Diagnosis Date Noted  . Protein-calorie malnutrition, severe 02/06/2018  . Peritonitis ([place]) 02/05/2018  . Pulmonary nodules   . ESRD (end stage renal disease) on dialysis ([place])   . 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 Hyperphosphatemia 07/25/2016  . Essential hypertension 12/25/2007    Past Surgical History:  Procedure Laterality Date  . ANTERIOR CERVICAL DECOMP/DISCECTOMY FUSION  2005  . BACK SURGERY    . COLONOSCOPY  2001   Archie Endo 09/21/2010  . CYSTECTOMY Bilateral    hx/notes 09/21/2010  .  IR GENERIC HISTORICAL  07/20/2016   IR FLUORO GUIDE CV LINE RIGHT 07/20/2016 [name], MD [facility]  . IR GENERIC HISTORICAL  07/20/2016   IR US GUIDE VASC ACCESS RIGHT 07/20/2016 [name], MD [facility]  . LUNG BIOPSY Left    left side, not cancerous, thoracotomy  . TONSILLECTOMY AND ADENOIDECTOMY     hx/notes 09/21/2010  . VAGINAL HYSTERECTOMY     hx w/oophorectomy/notes 09/21/2010     OB History   No obstetric history on file.     Family History  Problem Relation Age of Onset  . Throat cancer Father   .  Hypertension Sister   . Thyroid disease Daughter   . Hypertension Sister        x 2  . Breast cancer Maternal Aunt   . Blindness Sister        legally blind  . Colon cancer Neg Hx   . Stomach cancer Neg Hx     Social History   Tobacco Use  . Smoking status: Former Smoker    Packs/day: 0.12    Years: 8.00    Pack years: 0.96    Types: Cigarettes    Quit date: 05/04/1985    Years since quitting: 34.1  . Smokeless tobacco: Never Used  Substance Use Topics  . Alcohol use: Yes    Alcohol/week: 1.0 standard drinks    Types: 1 Cans of beer per week  . Drug use: No    Home Medications Prior to Admission medications   Medication Sig Start Date End Date Taking? Authorizing Provider  acetaminophen (TYLENOL) 500 MG tablet Take 1,000 mg by mouth every 6 (six) hours as needed (for headaches or pain).    Yes [provider]  amLODipine (NORVASC) 10 MG tablet Take 10 mg by mouth daily.    Yes [provider]  aspirin EC 81 MG tablet Take 81 mg by mouth daily.   Yes [provider]  atorvastatin (LIPITOR) 10 MG tablet Take 1 tablet (10 mg total) by mouth daily. 10/20/15  Yes Biagio Borg, MD  calcitRIOL (ROCALTROL) 0.25 MCG capsule Take 0.25 mcg by mouth daily.  03/14/19  Yes [provider]  Cholecalciferol (VITAMIN D3) 50 MCG (2000 UT) TABS Take 4,000 Units by mouth daily.   Yes [provider]  COLACE 100 MG capsule Take 100 mg by mouth daily. 03/14/19  Yes [provider]  ferric citrate (AURYXIA) 1 GM 210 MG(Fe) tablet Take 210 mg by mouth 3 (three) times daily with meals.   Yes [provider]  lactulose (CHRONULAC) 10 GM/15ML solution Take 10 g by mouth daily as needed for mild constipation.   Yes [provider]  loratadine-pseudoephedrine (CLARITIN-D 12 HOUR) 5-120 MG tablet Take 1 tablet by mouth every 12 (twelve) hours.   Yes [provider]  Melatonin 5 MG CHEW Chew 5 mg by mouth at bedtime as needed  (for sleep).   Yes [provider]  multivitamin (RENA-VIT) TABS tablet Take 1 tablet by mouth daily.   Yes [provider]  pantoprazole (PROTONIX) 40 MG tablet Take 1 tablet (40 mg total) by mouth daily. 10/20/15  Yes Biagio Borg, MD  Potassium Chloride ER 20 MEQ TBCR Take 20 mEq by mouth daily.  05/24/19  Yes [provider]  sodium chloride (OCEAN) 0.65 % SOLN nasal spray Place 2 sprays into both nostrils daily as needed for congestion.    Yes [provider]  torsemide (DEMADEX) 20  MG tablet Take 40 mg by mouth daily. 01/03/18  Yes [provider]  traZODone (DESYREL) 50 MG tablet Take 50 mg by mouth at bedtime as needed for sleep.  06/24/19  Yes [provider]    Allergies    Ace inhibitors, Calcium channel blockers, Diltiazem, Other, Pneumococcal vaccines, Tetanus toxoid, Latex, and Tape  Review of Systems   Review of Systems  All other systems reviewed and are negative.   Physical Exam Updated Vital Signs BP 94/73   Pulse 87   Temp (!) 97.4 F (36.3 C) (Oral)   Resp 18   SpO2 98%   Physical Exam Vitals and nursing note reviewed.  Constitutional:      General: She is not in acute distress.    Appearance: She is well-developed.  HENT:     Head: Normocephalic and atraumatic.     Comments: There is no tenderness over the frontal or maxillary sinuses    Nose:     Comments: Nasal passages are patent bilaterally with mild swelling of the turbinates    Mouth/Throat:     Mouth: Mucous membranes are moist.     Pharynx: No oropharyngeal exudate or posterior oropharyngeal erythema.     Comments: Normal-appearing oral mucosa, she does have purulent drainage in the posterior pharynx Eyes:     General: No scleral icterus.       Right eye: No discharge.        Left eye: No discharge.     Conjunctiva/sclera: Conjunctivae normal.     Pupils: Pupils are equal, round, and reactive to light.  Neck:     Thyroid: No thyromegaly.      Vascular: No JVD.  Cardiovascular:     Rate and Rhythm: Regular rhythm. Tachycardia present.     Heart sounds: Normal heart sounds. No murmur. No friction rub. No gallop.      Comments: Tachycardic to 105 bpm with normal appearing neck without JVD and no peripheral edema.  Occasional ectopy Pulmonary:     Effort: Pulmonary effort is normal. No respiratory distress.     Breath sounds: Normal breath sounds. No wheezing or rales.     Comments: Lung sounds are clear without any wheezing rhonchi or rales, she speaks in full sentences and does not appear dyspneic or have any accessory muscle use or increased work of breathing Abdominal:     General: Bowel sounds are normal. There is no distension.     Palpations: Abdomen is soft. There is no mass.     Tenderness: There is no abdominal tenderness.     Comments: Peritoneal dialysis catheter in place without surrounding redness or tenderness, no abdominal tenderness to palpation  Musculoskeletal:        General: No tenderness. Normal range of motion.     Cervical back: Normal range of motion and neck supple.  Lymphadenopathy:     Cervical: No cervical adenopathy.  Skin:    General: Skin is warm and dry.     Findings: No erythema or rash.  Neurological:     Mental Status: She is alert.     Coordination: Coordination normal.     Comments: Normal gait speech and coordination  Psychiatric:        Behavior: Behavior normal.     ED Results / Procedures / Treatments   Labs (all labs ordered are listed, but only abnormal results are displayed) Labs Reviewed  BASIC METABOLIC PANEL - Abnormal; Notable for the following components:  Result Value   Chloride 94 (*)    Glucose, Bld 124 (*)    BUN 55 (*)    Creatinine, Ser 11.38 (*)    GFR calc non Af Amer 3 (*)    GFR calc Af Amer 3 (*)    Anion gap 19 (*)    All other components within normal limits  TROPONIN I (HIGH SENSITIVITY) - Abnormal; Notable for the following components:    Troponin I (High Sensitivity) 30 (*)    All other components within normal limits  TROPONIN I (HIGH SENSITIVITY) - Abnormal; Notable for the following components:   Troponin I (High Sensitivity) 31 (*)    All other components within normal limits  URINE CULTURE  CBC  BRAIN NATRIURETIC PEPTIDE  URINALYSIS, ROUTINE W REFLEX MICROSCOPIC    EKG EKG Interpretation  Date/Time:  Tuesday June 25 2019 13:29:03 EST Ventricular Rate:  106 PR Interval:  140 QRS Duration: 64 QT Interval:  344 QTC Calculation: 456 R Axis:   -42 Text Interpretation: Sinus tachycardia Left axis deviation Septal infarct , age undetermined Abnormal ECG Since last tracing rate faster Confirmed by Noemi Chapel [phone]) on 06/25/2019 3:52:34 PM   Radiology DG Chest 2 View  Result Date: 06/25/2019 CLINICAL DATA:  Shortness of breath and cardiac palpitations EXAM: CHEST - 2 VIEW COMPARISON:  July 26, 2016 FINDINGS: There are pars stopper deve changes on the left. There is scarring bilaterally. There is no frank edema or airspace opacity. The heart size and pulmonary vascularity are normal. No adenopathy evident. There is postoperative change in the lower cervical region. IMPRESSION: Postoperative changes on the left. Areas of scarring bilaterally. No edema or consolidation. Cardiac silhouette within normal limits. No evident adenopathy. Electronically Signed   By: Lowella Grip III M.D.   On: 06/25/2019 14:15    Procedures Procedures (including critical care time)  Medications Ordered in ED Medications  0.9 %  sodium chloride infusion ( Intravenous Stopped 06/25/19 1850)    ED Course  I have reviewed the triage vital signs and the nursing notes.  Pertinent labs & imaging results that were available during my care of the patient were reviewed by me and considered in my medical decision making (see chart for details).    MDM Rules/Calculators/A&P                       I have personally viewed and  interpreted the patient's chest x-ray.  There are some postoperative scarring but no signs of acute infiltrate pneumothorax or abnormal mediastinum.  I have personally viewed and interpreted the EKG.  There appears to be slight sinus tachycardia.  Since the last tracing the rate is slightly increased.  No other significant changes.  The patient's lab work reveals no anemia, no leukocytosis, expected renal dysfunction without significant electrolyte abnormalities.  We will get orthostatics and give her a p.o. trial.  She is agreeable to the plan.  Her story is more symptomatic with standing thus suggesting orthostasis.  Check a urinalysis as well.  Patient agreeable, she is not having any chest pain.   This patient's orthostatic vital signs are significant for a significant drop in blood pressure from over 100 to 70s, heart rate seem to increase consistent with orthostatic hypotension.  The patient will be given some IV fluids.  She does not appear fluid overloaded at all.  The patient has significantly improved and repeat orthostatics the patient is able to perform without any symptoms, no  lightheadedness or shortness of breath.  No hypotension of any significance on repeat exam, the patient desires discharge to go [place] to home dialysis.  I think this is reasonable  Final Clinical Impression(s) / ED Diagnoses Final diagnoses:  Orthostatic hypotension    Rx / DC Orders ED Discharge Orders    None       Noemi Chapel, MD 06/25/19 2047

## 2019-06-25 NOTE — ED Triage Notes (Signed)
Pt reports sob and heart palpitations since yesterday, pt denies chest pain at this time. Pt does peritoneal dialysis. Pt a.o, nad noted.

## 2019-06-25 NOTE — ED Notes (Signed)
While pt stood up for orthostatic V/S pt b/p dropped to 74/61

## 2019-06-27 LAB — URINE CULTURE: Culture: <10000 — AB

## 2019-06-28 ENCOUNTER — Other Ambulatory Visit: Payer: Self-pay

## 2019-06-28 ENCOUNTER — Emergency Department (HOSPITAL_COMMUNITY): Payer: Medicare HMO

## 2019-06-28 ENCOUNTER — Emergency Department (HOSPITAL_COMMUNITY)
Admission: EM | Admit: 2019-06-28 | Discharge: 2019-06-28 | Disposition: A | Discharge status: Home / Self Care | Payer: Medicare HMO | Attending: Emergency Medicine | Admitting: Emergency Medicine

## 2019-06-28 ENCOUNTER — Encounter (HOSPITAL_COMMUNITY): Payer: Self-pay

## 2019-06-28 DIAGNOSIS — Z79899 Other long term (current) drug therapy: Secondary | ICD-10-CM | POA: Diagnosis not present

## 2019-06-28 DIAGNOSIS — Z7982 Long term (current) use of aspirin: Secondary | ICD-10-CM | POA: Insufficient documentation

## 2019-06-28 DIAGNOSIS — Z20822 Contact with and (suspected) exposure to covid-19: Secondary | ICD-10-CM | POA: Diagnosis not present

## 2019-06-28 DIAGNOSIS — I120 Hypertensive chronic kidney disease with stage 5 chronic kidney disease or end stage renal disease: Secondary | ICD-10-CM | POA: Insufficient documentation

## 2019-06-28 DIAGNOSIS — N186 End stage renal disease: Secondary | ICD-10-CM | POA: Insufficient documentation

## 2019-06-28 DIAGNOSIS — Z91040 Latex allergy status: Secondary | ICD-10-CM | POA: Insufficient documentation

## 2019-06-28 DIAGNOSIS — R0602 Shortness of breath: Secondary | ICD-10-CM | POA: Diagnosis present

## 2019-06-28 DIAGNOSIS — J449 Chronic obstructive pulmonary disease, unspecified: Secondary | ICD-10-CM | POA: Diagnosis not present

## 2019-06-28 DIAGNOSIS — Z87891 Personal history of nicotine dependence: Secondary | ICD-10-CM | POA: Insufficient documentation

## 2019-06-28 DIAGNOSIS — Z992 Dependence on renal dialysis: Secondary | ICD-10-CM | POA: Diagnosis not present

## 2019-06-28 LAB — CBC WITH DIFFERENTIAL/PLATELET
Abs Immature Granulocytes: 0.05 K/uL (ref 0.00–0.07)
Basophils Absolute: 0.2 K/uL — ABNORMAL HIGH (ref 0.0–0.1)
Basophils Relative: 2 %
Eosinophils Absolute: 0.4 K/uL (ref 0.0–0.5)
Eosinophils Relative: 6 %
HCT: 48.5 % — ABNORMAL HIGH (ref 36.0–46.0)
Hemoglobin: 16.4 g/dL — ABNORMAL HIGH (ref 12.0–15.0)
Immature Granulocytes: 1 %
Lymphocytes Relative: 24 %
Lymphs Abs: 1.8 K/uL (ref 0.7–4.0)
MCH: 32 pg (ref 26.0–34.0)
MCHC: 33.8 g/dL (ref 30.0–36.0)
MCV: 94.5 fL (ref 80.0–100.0)
Monocytes Absolute: 0.6 K/uL (ref 0.1–1.0)
Monocytes Relative: 8 %
Neutro Abs: 4.2 K/uL (ref 1.7–7.7)
Neutrophils Relative %: 59 %
Platelets: 290 K/uL (ref 150–400)
RBC: 5.13 MIL/uL — ABNORMAL HIGH (ref 3.87–5.11)
RDW: 11.3 % — ABNORMAL LOW (ref 11.5–15.5)
WBC: 7.2 K/uL (ref 4.0–10.5)
nRBC: 0 % (ref 0.0–0.2)

## 2019-06-28 LAB — COMPREHENSIVE METABOLIC PANEL WITH GFR
ALT: 26 U/L (ref 0–44)
AST: 30 U/L (ref 15–41)
Albumin: 4.1 g/dL (ref 3.5–5.0)
Alkaline Phosphatase: 79 U/L (ref 38–126)
Anion gap: 20 — ABNORMAL HIGH (ref 5–15)
BUN: 48 mg/dL — ABNORMAL HIGH (ref 8–23)
CO2: 25 mmol/L (ref 22–32)
Calcium: 10.1 mg/dL (ref 8.9–10.3)
Chloride: 89 mmol/L — ABNORMAL LOW (ref 98–111)
Creatinine, Ser: 12.14 mg/dL — ABNORMAL HIGH (ref 0.44–1.00)
GFR calc Af Amer: 3 mL/min — ABNORMAL LOW
GFR calc non Af Amer: 3 mL/min — ABNORMAL LOW
Glucose, Bld: 147 mg/dL — ABNORMAL HIGH (ref 70–99)
Potassium: 4.2 mmol/L (ref 3.5–5.1)
Sodium: 134 mmol/L — ABNORMAL LOW (ref 135–145)
Total Bilirubin: 0.7 mg/dL (ref 0.3–1.2)
Total Protein: 8.1 g/dL (ref 6.5–8.1)

## 2019-06-28 LAB — TROPONIN I (HIGH SENSITIVITY)
Troponin I (High Sensitivity): 66 ng/L — ABNORMAL HIGH
Troponin I (High Sensitivity): 65 ng/L — ABNORMAL HIGH (ref <18)

## 2019-06-28 LAB — POC SARS CORONAVIRUS 2 AG -  ED: SARS Coronavirus 2 Ag: NEGATIVE

## 2019-06-28 LAB — D-DIMER, QUANTITATIVE: D-Dimer, Quant: 0.54 ug{FEU}/mL — ABNORMAL HIGH (ref 0.00–0.50)

## 2019-06-28 LAB — BRAIN NATRIURETIC PEPTIDE: B Natriuretic Peptide: 55.7 pg/mL (ref 0.0–100.0)

## 2019-06-28 NOTE — ED Notes (Signed)
Patient verbalizes understanding of discharge instructions. Opportunity for questioning and answers were provided. Armband removed by staff, pt discharged from ED.  

## 2019-06-28 NOTE — Discharge Instructions (Addendum)
Follow-up closely with your primary care doctor or nephrologist.

## 2019-06-28 NOTE — ED Notes (Signed)
Pt explained we they will come get her for a CXR Pt states, "I just had all of this done Monday." I explained we needed to make sure everything was okay. Pt replied, "I've had this sinus infection for a long time."

## 2019-06-28 NOTE — ED Triage Notes (Signed)
Pt reports SOB starting yesterday, seen here Monday for her blood pressure. Pt states she cant take but a few steps at a time

## 2019-06-28 NOTE — ED Provider Notes (Signed)
[facility] EMERGENCY DEPARTMENT Provider Note   CSN: [id number] Arrival date & time: 06/28/19  1242     History Chief Complaint  Patient presents with  . Shortness of Breath    [name] is a 74 y.o. female.  The history is provided by the patient.  Shortness of Breath Severity:  Mild Onset quality:  Gradual Timing:  Constant Progression:  Waxing and waning Context: URI (stuffy nose recently, however patient does home peritoneal diaylsis, have upper back pain and SOB with exertion)   Relieved by:  Rest Worsened by:  Exertion Associated symptoms: no abdominal pain, no chest pain, no cough, no ear pain, no fever, no rash, no sore throat, no vomiting and no wheezing   Risk factors comment:  COPD      Past Medical History:  Diagnosis Date  . Acute kidney injury ([place]) 07/20/2016  . Allergic rhinitis   . Anxiety   . CAP (community acquired pneumonia) 06/2016   [name] 06/19/2016  . Cervical disc disease    Archie Endo 07/20/2016  . Chest pain at rest 06/18/2012  . COPD (chronic obstructive pulmonary disease) ([place]) 05/23/2012  . Diverticulosis   . DJD (degenerative joint disease)   . Esophageal diverticulum   . Esophageal stricture   . GERD (gastroesophageal reflux disease)   . Hiatal hernia   . History of blood transfusion 06/2016   "when I was hospitalized w/sepsis"  . Hyperlipidemia 05/23/2012   T. Chol 234, LDL 130   . Hypertension   . IBS (irritable bowel syndrome)   . Lung nodules    hx  with extensive workup including lung biopsy ruling out Sjogren's disease/notes 07/20/2016  . Osteopenia   . Recurrent sinusitis    [name] 07/20/2016  . Renal insufficiency   . Sinus headache    "daily lately" (07/20/2016)  . Traumatic arthritis     Patient Active Problem List   Diagnosis Date Noted  . Protein-calorie malnutrition, severe 02/06/2018  . Peritonitis ([place]) 02/05/2018  . Pulmonary nodules   . ESRD (end stage renal disease) on dialysis  ([place])   . 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 Hyperphosphatemia 07/25/2016  . Essential hypertension 12/25/2007    Past Surgical History:  Procedure Laterality Date  . ANTERIOR CERVICAL DECOMP/DISCECTOMY FUSION  2005  . BACK SURGERY    . COLONOSCOPY  2001   Archie Endo 09/21/2010  . CYSTECTOMY Bilateral    hx/notes 09/21/2010  . IR GENERIC HISTORICAL  07/20/2016   IR FLUORO GUIDE CV LINE RIGHT 07/20/2016 [name], MD [facility]  . IR GENERIC HISTORICAL  07/20/2016   IR US GUIDE VASC ACCESS RIGHT 07/20/2016 [name], MD [facility]  . LUNG BIOPSY Left    left side, not cancerous, thoracotomy  . TONSILLECTOMY AND ADENOIDECTOMY     hx/notes 09/21/2010  . VAGINAL HYSTERECTOMY     hx w/oophorectomy/notes 09/21/2010     OB History   No obstetric history on file.     Family History  Problem Relation Age of Onset  . Throat cancer Father   . Hypertension Sister   . Thyroid disease Daughter   . Hypertension Sister        x 2  . Breast cancer Maternal Aunt   . Blindness Sister        legally blind  . Colon cancer Neg Hx   . Stomach cancer Neg Hx     Social History   Tobacco Use  .  Smoking status: Former Smoker    Packs/day: 0.12    Years: 8.00    Pack years: 0.96    Types: Cigarettes    Quit date: 05/04/1985    Years since quitting: 34.1  . Smokeless tobacco: Never Used  Substance Use Topics  . Alcohol use: Yes    Alcohol/week: 1.0 standard drinks    Types: 1 Cans of beer per week  . Drug use: No    Home Medications Prior to Admission medications   Medication Sig Start Date End Date Taking? Authorizing Provider  acetaminophen (TYLENOL) 500 MG tablet Take 1,000 mg by mouth every 6 (six) hours as needed (for headaches or pain).    Yes [provider]  amLODipine (NORVASC) 10 MG tablet Take 10 mg by mouth daily.    Yes [provider]  aspirin EC 81 MG tablet Take 81 mg by mouth  daily.   Yes [provider]  atorvastatin (LIPITOR) 10 MG tablet Take 1 tablet (10 mg total) by mouth daily. 10/20/15  Yes Biagio Borg, MD  calcitRIOL (ROCALTROL) 0.25 MCG capsule Take 0.25 mcg by mouth daily.  03/14/19  Yes [provider]  Cholecalciferol (VITAMIN D3) 50 MCG (2000 UT) TABS Take 4,000 Units by mouth daily.   Yes [provider]  COLACE 100 MG capsule Take 100 mg by mouth daily as needed for mild constipation.  03/14/19  Yes [provider]  ferric citrate (AURYXIA) 1 GM 210 MG(Fe) tablet Take 210 mg by mouth 3 (three) times daily with meals.   Yes [provider]  lactulose (CHRONULAC) 10 GM/15ML solution Take 10 g by mouth daily as needed for mild constipation.   Yes [provider]  loratadine-pseudoephedrine (CLARITIN-D 12 HOUR) 5-120 MG tablet Take 1 tablet by mouth every 12 (twelve) hours.   Yes [provider]  Melatonin 5 MG TABS Take 5 mg by mouth at bedtime as needed (for sleep).    Yes [provider]  multivitamin (RENA-VIT) TABS tablet Take 1 tablet by mouth daily.   Yes [provider]  pantoprazole (PROTONIX) 40 MG tablet Take 1 tablet (40 mg total) by mouth daily. 10/20/15  Yes Biagio Borg, MD  Potassium Chloride ER 20 MEQ TBCR Take 20 mEq by mouth daily.  05/24/19  Yes [provider]  sodium chloride (OCEAN) 0.65 % SOLN nasal spray Place 2 sprays into both nostrils daily as needed for congestion.    Yes [provider]  torsemide (DEMADEX) 20 MG tablet Take 40 mg by mouth daily. 01/03/18  Yes [provider]  traZODone (DESYREL) 50 MG tablet Take 50 mg by mouth at bedtime as needed for sleep.  06/24/19  Yes [provider]    Allergies    Ace inhibitors, Calcium channel blockers, Diltiazem, Other, Pneumococcal vaccines, Tetanus toxoid, Latex, and Tape  Review of Systems   Review of Systems  Constitutional: Negative for chills and fever.  HENT:  Negative for ear pain and sore throat.   Eyes: Negative for pain and visual disturbance.  Respiratory: Positive for shortness of breath. Negative for cough and wheezing.   Cardiovascular: Negative for chest pain and palpitations.  Gastrointestinal: Negative for abdominal pain and vomiting.  Genitourinary: Negative for dysuria and hematuria.  Musculoskeletal: Positive for back pain. Negative for arthralgias.  Skin: Negative for color change and rash.  Neurological: Negative for seizures and syncope.  All other systems reviewed and are negative.   Physical Exam Updated Vital Signs  ED Triage Vitals  Enc Vitals Group     BP 06/28/19 1249 111/79     Pulse Rate 06/28/19 1249 (!) 112     Resp 06/28/19 1249 16     Temp 06/28/19 1249 (!) 97.5 F (36.4 C)     Temp Source 06/28/19 1249 Oral     SpO2 06/28/19 1249 97 %     Weight 06/28/19 1537 98 lb (44.5 kg)     Height 06/28/19 1537 5' (1.524 m)     Head Circumference --      Peak Flow --      Pain Score 06/28/19 1247 0     Pain Loc --      Pain Edu? --      Excl. in [place]? --     Physical Exam Vitals and nursing note reviewed.  Constitutional:      General: She is not in acute distress.    Appearance: She is well-developed. She is ill-appearing.  HENT:     Head: Normocephalic and atraumatic.     Mouth/Throat:     Mouth: Mucous membranes are moist.  Eyes:     Extraocular Movements: Extraocular movements intact.     Conjunctiva/sclera: Conjunctivae normal.     Pupils: Pupils are equal, round, and reactive to light.  Cardiovascular:     Rate and Rhythm: Normal rate and regular rhythm.     Pulses: Normal pulses.     Heart sounds: Normal heart sounds. No murmur.  Pulmonary:     Effort: Tachypnea present. No respiratory distress.     Breath sounds: Normal breath sounds. No decreased breath sounds, wheezing or rhonchi.  Chest:     Chest wall: No tenderness.  Abdominal:     Palpations: Abdomen is soft.     Tenderness: There is  no abdominal tenderness.  Musculoskeletal:     Cervical back: Neck supple.     Right lower leg: No edema.     Left lower leg: No edema.  Skin:    General: Skin is warm and dry.     Capillary Refill: Capillary refill takes less than 2 seconds.  Neurological:     General: No focal deficit present.     Mental Status: She is alert and oriented to person, place, and time.     Cranial Nerves: No cranial nerve deficit.     Motor: No weakness.  Psychiatric:        Mood and Affect: Mood normal.     ED Results / Procedures / Treatments   Labs (all labs ordered are listed, but only abnormal results are displayed) Labs Reviewed  CBC WITH DIFFERENTIAL/PLATELET - Abnormal; Notable for the following components:      Result Value   RBC 5.13 (*)    Hemoglobin 16.4 (*)    HCT 48.5 (*)    RDW 11.3 (*)    Basophils Absolute 0.2 (*)    All other components within normal limits  COMPREHENSIVE METABOLIC PANEL - Abnormal; Notable for the following components:   Sodium 134 (*)    Chloride 89 (*)    Glucose, Bld 147 (*)    BUN 48 (*)    Creatinine, Ser 12.14 (*)    GFR calc non Af Amer 3 (*)    GFR calc Af Amer 3 (*)    Anion gap 20 (*)    All other components within normal limits  D-DIMER, QUANTITATIVE (NOT AT [facility]) - Abnormal; Notable for the following components:   D-Dimer,  Quant 0.54 (*)    All other components within normal limits  TROPONIN I (HIGH SENSITIVITY) - Abnormal; Notable for the following components:   Troponin I (High Sensitivity) 66 (*)    All other components within normal limits  TROPONIN I (HIGH SENSITIVITY) - Abnormal; Notable for the following components:   Troponin I (High Sensitivity) 65 (*)    All other components within normal limits  BRAIN NATRIURETIC PEPTIDE  POC SARS CORONAVIRUS 2 AG -  ED    EKG EKG Interpretation  Date/Time:  Friday June 28 2019 12:43:54 EST Ventricular Rate:  114 PR Interval:  132 QRS Duration: 68 QT Interval:  326 QTC  Calculation: 449 R Axis:   -37 Text Interpretation: Sinus tachycardia Right atrial enlargement Left axis deviation Pulmonary disease pattern Abnormal QRS-T angle, consider primary T wave abnormality Abnormal ECG Confirmed by Lennice Sites [phone]) on 06/28/2019 3:18:54 PM   Radiology DG Chest 2 View  Result Date: 06/28/2019 CLINICAL DATA:  Pt reports SOB starting yesterday, seen here Monday for her blood pressure. Pt states she cant take but a few steps at a time. Hx lung nodules, COPD EXAM: CHEST - 2 VIEW COMPARISON:  06/25/2019 FINDINGS: Cardiac silhouette normal in size.  No mediastinal or hilar masses. Lungs are hyperexpanded. There are postsurgical changes on the left with multiple anastomosis staples, mild volume loss and areas of scarring. Right lung also shows linear and reticular opacities consistent with scarring. These findings are stable. No evidence of pneumonia or pulmonary edema. No pleural effusion or pneumothorax. Skeletal structures intact. IMPRESSION: 1. No acute cardiopulmonary disease. 2. COPD, areas of chronic lung scarring and stable postsurgical changes on the left. Electronically Signed   By: Lajean Manes M.D.   On: 06/28/2019 13:21    Procedures Procedures (including critical care time)  Medications Ordered in ED Medications - No data to display  ED Course  I have reviewed the triage vital signs and the nursing notes.  Pertinent labs & imaging results that were available during my care of the patient were reviewed by me and considered in my medical decision making (see chart for details).    MDM Rules/Calculators/A&P                      SYNIYA CUI is a 74 year old female with history of hypertension, COPD, high cholesterol, end-stage renal disease on peritoneal dialysis who presents to the ED with shortness of breath.  Symptoms ongoing for the last several days.  Worse with exertion and improves at rest.  Has had sinus type congestion symptoms.  Patient  denies any fever, cough.  Has had some upper back pain but denies any specific chest pain.  No abdominal pain.  Has overall clear breath sounds on exam.  No signs of volume overload on exam.  Patient with lab work and imaging ordered prior to my evaluation.  Appears on chest x-ray that she has no pneumonia, no pneumothorax.  Patient has mildly elevated troponin.  Creatinine at baseline.  No significant electrolyte abnormality otherwise.  No significant anemia or leukocytosis.  Will evaluate further with a D-dimer, BNP, repeat troponin.  Possibly infectious versus cardiac versus pulmonary process.  Troponin unchanged and at baseline.  BNP within normal limits.  D-dimer age-adjusted is normal and doubt PE.  Overall lab work is unremarkable.  No signs suggest obvious volume overload.  Doubt ACS or PE.  Patient not having chest pain.  Overall feels better after known results.  Recommend  close follow-up with primary care doctor nephrologist and discharged in the ED in good condition.  This chart was dictated using voice recognition software.  Despite best efforts to proofread,  errors can occur which can change the documentation meaning.    Final Clinical Impression(s) / ED Diagnoses Final diagnoses:  Shortness of breath    Rx / DC Orders ED Discharge Orders    None       Lennice Sites, DO 06/28/19 1944

## 2019-07-03 ENCOUNTER — Emergency Department (HOSPITAL_COMMUNITY)
Admission: EM | Admit: 2019-07-03 | Discharge: 2019-07-03 | Disposition: A | Discharge status: Home / Self Care | Payer: Medicare HMO | Attending: Emergency Medicine | Admitting: Emergency Medicine

## 2019-07-03 ENCOUNTER — Emergency Department (HOSPITAL_COMMUNITY): Payer: Medicare HMO

## 2019-07-03 ENCOUNTER — Other Ambulatory Visit: Payer: Self-pay

## 2019-07-03 ENCOUNTER — Encounter (HOSPITAL_COMMUNITY): Payer: Self-pay | Admitting: Emergency Medicine

## 2019-07-03 DIAGNOSIS — I120 Hypertensive chronic kidney disease with stage 5 chronic kidney disease or end stage renal disease: Secondary | ICD-10-CM | POA: Insufficient documentation

## 2019-07-03 DIAGNOSIS — Z91040 Latex allergy status: Secondary | ICD-10-CM | POA: Diagnosis not present

## 2019-07-03 DIAGNOSIS — Z87891 Personal history of nicotine dependence: Secondary | ICD-10-CM | POA: Diagnosis not present

## 2019-07-03 DIAGNOSIS — Z992 Dependence on renal dialysis: Secondary | ICD-10-CM | POA: Diagnosis not present

## 2019-07-03 DIAGNOSIS — R0602 Shortness of breath: Secondary | ICD-10-CM | POA: Diagnosis not present

## 2019-07-03 DIAGNOSIS — N186 End stage renal disease: Secondary | ICD-10-CM | POA: Diagnosis not present

## 2019-07-03 DIAGNOSIS — Z7982 Long term (current) use of aspirin: Secondary | ICD-10-CM | POA: Diagnosis not present

## 2019-07-03 DIAGNOSIS — Z79899 Other long term (current) drug therapy: Secondary | ICD-10-CM | POA: Insufficient documentation

## 2019-07-03 DIAGNOSIS — I951 Orthostatic hypotension: Secondary | ICD-10-CM

## 2019-07-03 MED ORDER — SODIUM CHLORIDE 0.9 % IV BOLUS
1000.0000 mL | Freq: Once | INTRAVENOUS | Status: AC
  Administered 2019-07-03: 16:00:00 1000 mL via INTRAVENOUS

## 2019-07-03 MED ORDER — SODIUM CHLORIDE 0.9 % IV BOLUS
500.0000 mL | Freq: Once | INTRAVENOUS | Status: AC
  Administered 2019-07-03: 500 mL via INTRAVENOUS

## 2019-07-03 NOTE — Discharge Instructions (Addendum)
Your vitals have improved with IV fluids. Recommend talking to your nephrologist to see if you are taking off too much fluid in your dialysis causing your blood pressure to be too low and making your feel dizzy with standing.  Follow up with your pulmonologist for your shortness of breath. This may be from having COVID.

## 2019-07-03 NOTE — ED Notes (Signed)
Pt vitals improved with fluid bolus. Pt denies dizziness and ambulated around department with assistance. States she will have someone at home to assist as needed. Pt educated and verbalized understanding with dc instructions. Educated to return tp PCP or Ed if symptoms get worse.

## 2019-07-03 NOTE — ED Provider Notes (Signed)
[facility] DEPT Provider Note   CSN: [id number] Arrival date & time: 07/03/19  1232     History Chief Complaint  Patient presents with   Shortness of Breath    with exertion    [name] is a 74 y.o. female.  74 year old female presents with complaint of shortness of breath and feeling lightheaded.  Patient states her symptoms started the beginning of the month, went to her PCP at [facility] and was diagnosed with a sinus infection, given amoxicillin.  Patient had a positive Covid antibody test on February 8, unsure why she was tested for antibodies.  Patient came to the emergency room on February 16, was found to be orthostatic and was given IV fluids and felt like she improved.  Patient came back to the emergency room on February 19, labs and chest x-ray were reassuring, age-adjusted D-dimer negative and she was discharged from with her PCP.  Patient went to her PCP the following day, February 20, states that her oxygen only got as high as 81% after breathing treatment, she is unclear what else was done for her and she was discharged.  Patient presents today stating that she is not feeling any better, denies chest pain or leg swelling, abdominal pain, fever.  Patient is not on home oxygen, is not anticoagulated.  Patient is frustrated that she still feels short of breath and is not feeling better.  No other complaints or concerns today. Past medical history of end-stage renal disease, on at home peritoneal dialysis, does make urine, COPD.  Patient reports compliance with medications, dialysis.  [name] was evaluated in Emergency Department on 07/03/2019 for the symptoms described in the history of present illness. She was evaluated in the context of the global COVID-19 pandemic, which necessitated consideration that the patient might be at risk for infection with the SARS-CoV-2 virus that causes COVID-19. Institutional protocols and algorithms  that pertain to the evaluation of patients at risk for COVID-19 are in a state of rapid change based on information released by regulatory bodies including the CDC and federal and state organizations. These policies and algorithms were followed during the patient's care in the ED.         Past Medical History:  Diagnosis Date   Acute kidney injury ([place]) 07/20/2016   Allergic rhinitis    Anxiety    CAP (community acquired pneumonia) 06/2016   [name] 06/19/2016   Cervical disc disease    /notes 07/20/2016   Chest pain at rest 06/18/2012   COPD (chronic obstructive pulmonary disease) ([place]) 05/23/2012   Diverticulosis    DJD (degenerative joint disease)    Esophageal diverticulum    Esophageal stricture    GERD (gastroesophageal reflux disease)    Hiatal hernia    History of blood transfusion 06/2016   "when I was hospitalized w/sepsis"   Hyperlipidemia 05/23/2012   T. 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   Hyperphosphatemia 07/25/2016   Essential hypertension 12/25/2007    Past Surgical History:  Procedure Laterality Date   ANTERIOR CERVICAL DECOMP/DISCECTOMY FUSION  2005   BACK SURGERY     COLONOSCOPY  2001   /notes 09/21/2010   CYSTECTOMY Bilateral    hx/notes 09/21/2010   IR GENERIC HISTORICAL  07/20/2016   IR FLUORO GUIDE CV LINE  RIGHT 07/20/2016 [name], MD [facility]   IR GENERIC HISTORICAL  07/20/2016   IR US GUIDE VASC ACCESS RIGHT 07/20/2016 [name], MD [facility]   LUNG BIOPSY Left    left side, not cancerous, thoracotomy   TONSILLECTOMY AND ADENOIDECTOMY     hx/notes 09/21/2010   VAGINAL HYSTERECTOMY     hx w/oophorectomy/notes 09/21/2010     OB History   No obstetric history on file.     Family History  Problem Relation Age of Onset   Throat cancer Father    Hypertension Sister    Thyroid disease Daughter    Hypertension Sister        x 2   Breast cancer Maternal Aunt    Blindness Sister        legally blind   Colon cancer Neg Hx    Stomach cancer Neg Hx     Social History   Tobacco Use   Smoking status: Former Smoker    Packs/day: 0.12    Years: 8.00    Pack years: 0.96    Types: Cigarettes    Quit date: 05/04/1985    Years since quitting: 34.1   Smokeless tobacco: Never Used  Substance Use Topics   Alcohol use: Yes    Alcohol/week: 1.0 standard drinks    Types: 1 Cans of beer per week   Drug use: No    Home Medications Prior to Admission medications   Medication Sig Start Date End Date Taking? Authorizing Provider  acetaminophen (TYLENOL) 500 MG tablet Take 1,000 mg by mouth every 6 (six) hours as needed (for headaches or pain).    Yes [provider]  amLODipine (NORVASC) 10 MG tablet Take 10 mg by mouth daily.    Yes [provider]  aspirin EC 81 MG tablet Take 81 mg by mouth daily.   Yes [provider]  atorvastatin (LIPITOR) 10 MG tablet Take 1 tablet (10 mg total) by mouth daily. 10/20/15  Yes Biagio Borg, MD  BREO ELLIPTA 100-25 MCG/INH AEPB Inhale 1 puff into the lungs daily.  06/30/19  Yes [provider]  calcitRIOL (ROCALTROL) 0.25 MCG capsule Take 0.25 mcg by mouth daily.  03/14/19  Yes [provider]  Cholecalciferol (VITAMIN D3) 50 MCG (2000 UT) TABS Take 4,000 Units by mouth daily.   Yes  [provider]  COLACE 100 MG capsule Take 100 mg by mouth daily as needed for mild constipation.  03/14/19  Yes [provider]  ferric citrate (AURYXIA) 1 GM 210 MG(Fe) tablet Take 210 mg by mouth 3 (three) times daily with meals.   Yes [provider]  HYDROcodone-acetaminophen (NORCO/VICODIN) 5-325 MG tablet Take 1 tablet by mouth every 6 (six) hours as needed for moderate pain or severe pain.  06/30/19  Yes [provider]  lactulose (CHRONULAC) 10 GM/15ML solution Take 10 g by mouth daily as needed for mild constipation.   Yes [provider]  loratadine-pseudoephedrine (CLARITIN-D 12 HOUR) 5-120 MG  tablet Take 1 tablet by mouth every 12 (twelve) hours.   Yes [provider]  Melatonin 5 MG TABS Take 5 mg by mouth at bedtime as needed (for sleep).    Yes [provider]  multivitamin (RENA-VIT) TABS tablet Take 1 tablet by mouth daily.   Yes [provider]  pantoprazole (PROTONIX) 40 MG tablet Take 1 tablet (40 mg total) by mouth daily. 10/20/15  Yes Biagio Borg, MD  Potassium Chloride ER 20 MEQ TBCR Take 20 mEq by mouth daily.  05/24/19  Yes [provider]  sodium chloride (OCEAN) 0.65 % SOLN nasal spray Place 2 sprays into both nostrils daily as needed for congestion.    Yes [provider]  torsemide (DEMADEX) 20 MG tablet Take 40 mg by mouth daily. 01/03/18  Yes [provider]    Allergies    Ace inhibitors, Calcium channel blockers, Diltiazem, Other, Pneumococcal vaccines, Tetanus toxoid, Latex, and Tape  Review of Systems   Review of Systems  Constitutional: Negative for chills, diaphoresis and fever.  HENT: Positive for congestion. Negative for sore throat.   Respiratory: Positive for shortness of breath. Negative for cough.   Cardiovascular: Negative for chest pain, palpitations and leg swelling.  Gastrointestinal: Negative for abdominal pain, nausea and vomiting.    Musculoskeletal: Negative for arthralgias and myalgias.  Skin: Negative for rash and wound.  Neurological: Positive for light-headedness. Negative for weakness.  Hematological: Negative for adenopathy.  Psychiatric/Behavioral: Negative for confusion.  All other systems reviewed and are negative.   Physical Exam Updated Vital Signs BP (!) 111/57    Pulse 93    Temp 98.1 F (36.7 C) (Oral)    Resp 13    SpO2 96%   Physical Exam Vitals and nursing note reviewed.  Constitutional:      General: She is not in acute distress.    Appearance: She is well-developed. She is not diaphoretic.     Comments: thin  HENT:     Head: Normocephalic and atraumatic.  Cardiovascular:     Rate and Rhythm: Normal rate and regular rhythm.  Pulmonary:     Effort: Pulmonary effort is normal. No respiratory distress.     Breath sounds: Normal breath sounds. No decreased breath sounds.  Chest:     Chest wall: No tenderness.  Abdominal:     Palpations: Abdomen is soft.     Tenderness: There is no abdominal tenderness.  Musculoskeletal:     Right lower leg: No edema.     Left lower leg: No edema.  Skin:    General: Skin is warm and dry.  Neurological:     Mental Status: She is alert and oriented to person, place, and time.  Psychiatric:        Behavior: Behavior normal.     ED Results / Procedures / Treatments   Labs (all labs ordered are listed, but only abnormal results are displayed) Labs Reviewed - No data to display  EKG EKG Interpretation  Date/Time:  Wednesday July 03 2019 12:45:36 EST Ventricular Rate:  103 PR Interval:    QRS Duration: 90 QT Interval:  342 QTC Calculation: 448 R Axis:   -36 Text Interpretation: Sinus tachycardia Left axis deviation Nonspecific T abnormalities, lateral leads overall similar to previous Confirmed by Theotis Burrow [phone]) on 07/03/2019 2:18:49 PM   Radiology DG Chest 2 View  Result Date: 07/03/2019 CLINICAL DATA:  Shortness of breath,  hypoxia EXAM: CHEST - 2 VIEW COMPARISON:  06/28/2019 FINDINGS:  Normal heart size. Calcific aortic knob. Hyperexpanded lungs. Chronic postsurgical changes within the left lung. Chronic scarring within the right lung. Nipple shadow projects over the right lung base. No new focal airspace consolidation. No pleural effusion or pneumothorax. IMPRESSION: No acute cardiopulmonary findings.  Stable exam from 06/28/2019. Electronically Signed   By: Davina Poke D.O.   On: 07/03/2019 13:47   CT Chest Wo Contrast  Result Date: 07/03/2019 CLINICAL DATA:  Pt reports that the SOB with exertion has been going on for couple weeks. Reports has been seen at hospital but sent home. Went to [facility] this past weekend and said her O2 was 81% and sent home with an inhaler. EXAM: CT CHEST WITHOUT CONTRAST TECHNIQUE: Multidetector CT imaging of the chest was performed following the standard protocol without IV contrast. COMPARISON:  07/29/2016, PET-CT, 09/12/2016. FINDINGS: Cardiovascular: Heart is normal in size and configuration. No pericardial effusion. Three-vessel coronary artery calcifications. Main pulmonary artery is prominent measuring 3.4 cm. Mild aortic atherosclerotic calcifications. Mediastinum/Nodes: Small poorly defined thyroid nodules, all subcentimeter. No neck base, axillary, mediastinal or hilar masses or enlarged lymph nodes. Trachea and esophagus are unremarkable. Lungs/Pleura: Irregular nodule, peripheral right upper lobe, image 46, series 4, 1 cm transversely by 6 mm superior to inferior, without significant change from the prior study allowing for differences in measurement technique. Pulmonary anastomosis staples in the left lower lobe and left upper lobe, also unchanged. Focal area peribronchovascular opacity and bronchiectasis right lower lobe spanning 2.1 cm, centered on image 92, series 4, also without significant change from prior study there are additional areas of peripheral lung opacity and  bronchiectasis bilaterally consistent with scarring, without significant change. No pneumothorax.  No pleural effusion. Upper Abdomen: There is free intraperitoneal air in the upper abdomen, which extends into inferior mediastinum between the aorta heart. The etiology of this is unclear. Musculoskeletal: No fracture or acute finding. No osteoblastic or osteolytic lesions. IMPRESSION: 1. Free intraperitoneal air of unclear etiology. If there has not been recent abdominal surgery or instrumentation, recommend follow-up abdomen and pelvis CT for further assessment. 2. No acute findings in the chest. 3. Stable changes of lung scarring, bronchiectasis and previous left lung surgery. No convincing pneumonia and no pulmonary edema. 4. Prominent main pulmonary artery raises suspicion for pulmonary hypertension. 5. Mild aortic atherosclerosis. Aortic Atherosclerosis (ICD10-I70.0). Electronically Signed   By: Lajean Manes M.D.   On: 07/03/2019 16:54    Procedures Procedures (including critical care time)  Medications Ordered in ED Medications  sodium chloride 0.9 % bolus 1,000 mL (0 mLs Intravenous Stopped 07/03/19 1832)  sodium chloride 0.9 % bolus 500 mL (500 mLs Intravenous New Bag/Given 07/03/19 1835)    ED Course  I have reviewed the triage vital signs and the nursing notes.  Pertinent labs & imaging results that were available during my care of the patient were reviewed by me and considered in my medical decision making (see chart for details).  Clinical Course as of Jul 03 2011  Wed Jul 03, 2019  1825 73yo female with complaint of ongoing shortness of breath, evaluated twice previously in the ER for same. On first visit, found to be orthostatic, improved with IV fluids. Patient followed up with her PCP 4 days ago, had pulmonary function testing, unknown results, was told that her O2 was 81% but was dc home, not on home oxygen. Labs at bedside from PCP, positive COVID antibody test from 06/17/19, patient  reports nasal congestion onset earlier this month treated with  Amoxicillin, but denies COVID like illness.  On exam, well appearing with normal lung sounds. Previous visits reviewed, CXR ordered in triage without change from prior.  Discussed with Dr. Rex Kras, ER attending, will CT chest without contrast (does home peritoneal dialysis). CT chest unremarkable.  Orthostatic vitals assessed- patient found to be orthostatic, unable to assess standing vitals due to symptomatic orthostatic hypotension. Patient was given 1 L of IV fluids, orthostatic vitals reassessed, improved however remains orthostatic. Plan is to give additional 500 mL bolus and recheck orthostatic vitals.  If patient remains orthostatic will give final 500 mL bolus and reassess.  If patient remains symptomatic we will plan for admission, if symptoms improve will plan for discharge.  Discussed plan with patient, concern that she may be taking too much fluid in her peritoneal dialysis and should address this with her provider.  Consider possible cause of shortness of breath from residual Covid as she had a positive antibody test earlier this month.   [LM]  2013 Blood pressure has improved after 500 mL bolus (total 1500 mL).  Patient is feeling improved.  Recommend she follow-up with her nephrologist, consider taking off too much fluid causing her to be orthostatic.  Also recommend follow-up with her pulmonologist, consider residual Covid causing her ongoing shortness of breath.   [LM]    Clinical Course User Index [LM] Roque Lias   MDM Rules/Calculators/A&P                      Final Clinical Impression(s) / ED Diagnoses Final diagnoses:  Shortness of breath  Orthostatic hypotension    Rx / DC Orders ED Discharge Orders    None       Roque Lias 07/03/19 2013    Little, Wenda Overland, MD 07/06/19 [id number]

## 2019-07-03 NOTE — ED Triage Notes (Signed)
Pt reports that the SOB with exertion has been going on for couple weeks. Reports has been seen at hospital but sent home. Went to [facility] this past weekend and said her O2 was 81% and sent home with an inhaler.

## 2019-08-08 ENCOUNTER — Telehealth: Payer: Self-pay | Admitting: Pulmonary Disease

## 2019-08-08 NOTE — Telephone Encounter (Signed)
Called and spoke with Patient.  Patient stated she would like to schedule appointment with Dr. Chase Caller.  Patient last OV was with Tammy,NP 09/27/16.  Patient was seen for pulmonary nodules by Dr. Elsworth Soho. Patient scheduled Consult with Dr. Chase Caller, 09/23/19, at 1000.  Nothing further at this time.

## 2019-08-18 ENCOUNTER — Emergency Department (HOSPITAL_COMMUNITY): Payer: Medicare HMO

## 2019-08-18 ENCOUNTER — Encounter (HOSPITAL_COMMUNITY): Payer: Self-pay

## 2019-08-18 ENCOUNTER — Inpatient Hospital Stay (HOSPITAL_COMMUNITY)
Admission: EM | Admit: 2019-08-18 | Discharge: 2019-08-21 | DRG: 640 | Disposition: A | Discharge status: Home Health | Payer: Medicare HMO | Attending: Internal Medicine | Admitting: Internal Medicine

## 2019-08-18 ENCOUNTER — Other Ambulatory Visit: Payer: Self-pay

## 2019-08-18 DIAGNOSIS — D631 Anemia in chronic kidney disease: Secondary | ICD-10-CM | POA: Diagnosis not present

## 2019-08-18 DIAGNOSIS — Z7982 Long term (current) use of aspirin: Secondary | ICD-10-CM | POA: Diagnosis not present

## 2019-08-18 DIAGNOSIS — E861 Hypovolemia: Secondary | ICD-10-CM | POA: Diagnosis present

## 2019-08-18 DIAGNOSIS — I5042 Chronic combined systolic (congestive) and diastolic (congestive) heart failure: Secondary | ICD-10-CM | POA: Diagnosis present

## 2019-08-18 DIAGNOSIS — K922 Gastrointestinal hemorrhage, unspecified: Secondary | ICD-10-CM | POA: Diagnosis not present

## 2019-08-18 DIAGNOSIS — R0602 Shortness of breath: Secondary | ICD-10-CM

## 2019-08-18 DIAGNOSIS — E785 Hyperlipidemia, unspecified: Secondary | ICD-10-CM | POA: Diagnosis present

## 2019-08-18 DIAGNOSIS — E876 Hypokalemia: Secondary | ICD-10-CM | POA: Diagnosis present

## 2019-08-18 DIAGNOSIS — M8580 Other specified disorders of bone density and structure, unspecified site: Secondary | ICD-10-CM | POA: Diagnosis present

## 2019-08-18 DIAGNOSIS — Z803 Family history of malignant neoplasm of breast: Secondary | ICD-10-CM

## 2019-08-18 DIAGNOSIS — R0902 Hypoxemia: Secondary | ICD-10-CM

## 2019-08-18 DIAGNOSIS — I951 Orthostatic hypotension: Secondary | ICD-10-CM | POA: Diagnosis not present

## 2019-08-18 DIAGNOSIS — Z87891 Personal history of nicotine dependence: Secondary | ICD-10-CM

## 2019-08-18 DIAGNOSIS — J449 Chronic obstructive pulmonary disease, unspecified: Secondary | ICD-10-CM | POA: Diagnosis present

## 2019-08-18 DIAGNOSIS — J9601 Acute respiratory failure with hypoxia: Secondary | ICD-10-CM | POA: Diagnosis not present

## 2019-08-18 DIAGNOSIS — Z951 Presence of aortocoronary bypass graft: Secondary | ICD-10-CM

## 2019-08-18 DIAGNOSIS — E860 Dehydration: Secondary | ICD-10-CM | POA: Diagnosis not present

## 2019-08-18 DIAGNOSIS — Z681 Body mass index (BMI) 19 or less, adult: Secondary | ICD-10-CM

## 2019-08-18 DIAGNOSIS — Z992 Dependence on renal dialysis: Secondary | ICD-10-CM

## 2019-08-18 DIAGNOSIS — N2581 Secondary hyperparathyroidism of renal origin: Secondary | ICD-10-CM | POA: Diagnosis present

## 2019-08-18 DIAGNOSIS — E43 Unspecified severe protein-calorie malnutrition: Secondary | ICD-10-CM | POA: Diagnosis present

## 2019-08-18 DIAGNOSIS — Z79899 Other long term (current) drug therapy: Secondary | ICD-10-CM

## 2019-08-18 DIAGNOSIS — Z90710 Acquired absence of both cervix and uterus: Secondary | ICD-10-CM

## 2019-08-18 DIAGNOSIS — Z808 Family history of malignant neoplasm of other organs or systems: Secondary | ICD-10-CM | POA: Diagnosis not present

## 2019-08-18 DIAGNOSIS — N186 End stage renal disease: Secondary | ICD-10-CM | POA: Diagnosis not present

## 2019-08-18 DIAGNOSIS — I2510 Atherosclerotic heart disease of native coronary artery without angina pectoris: Secondary | ICD-10-CM | POA: Diagnosis present

## 2019-08-18 DIAGNOSIS — I132 Hypertensive heart and chronic kidney disease with heart failure and with stage 5 chronic kidney disease, or end stage renal disease: Secondary | ICD-10-CM | POA: Diagnosis present

## 2019-08-18 DIAGNOSIS — R55 Syncope and collapse: Secondary | ICD-10-CM

## 2019-08-18 DIAGNOSIS — R42 Dizziness and giddiness: Secondary | ICD-10-CM

## 2019-08-18 DIAGNOSIS — Z8616 Personal history of COVID-19: Secondary | ICD-10-CM

## 2019-08-18 LAB — CBC WITH DIFFERENTIAL/PLATELET
Abs Immature Granulocytes: 0.03 10*3/uL (ref 0.00–0.07)
Basophils Absolute: 0.1 10*3/uL (ref 0.0–0.1)
Basophils Relative: 2 %
Eosinophils Absolute: 0.4 10*3/uL (ref 0.0–0.5)
Eosinophils Relative: 7 %
HCT: 36.6 % (ref 36.0–46.0)
Hemoglobin: 12.3 g/dL (ref 12.0–15.0)
Immature Granulocytes: 1 %
Lymphocytes Relative: 15 %
Lymphs Abs: 1 10*3/uL (ref 0.7–4.0)
MCH: 32.2 pg (ref 26.0–34.0)
MCHC: 33.6 g/dL (ref 30.0–36.0)
MCV: 95.8 fL (ref 80.0–100.0)
Monocytes Absolute: 0.7 10*3/uL (ref 0.1–1.0)
Monocytes Relative: 11 %
Neutro Abs: 4.4 10*3/uL (ref 1.7–7.7)
Neutrophils Relative %: 64 %
Platelets: 265 10*3/uL (ref 150–400)
RBC: 3.82 MIL/uL — ABNORMAL LOW (ref 3.87–5.11)
RDW: 13.6 % (ref 11.5–15.5)
WBC: 6.7 10*3/uL (ref 4.0–10.5)
nRBC: 0 % (ref 0.0–0.2)

## 2019-08-18 LAB — COMPREHENSIVE METABOLIC PANEL
ALT: 26 U/L (ref 0–44)
AST: 30 U/L (ref 15–41)
Albumin: 3.7 g/dL (ref 3.5–5.0)
Alkaline Phosphatase: 67 U/L (ref 38–126)
Anion gap: 16 — ABNORMAL HIGH (ref 5–15)
BUN: 42 mg/dL — ABNORMAL HIGH (ref 8–23)
CO2: 27 mmol/L (ref 22–32)
Calcium: 9.3 mg/dL (ref 8.9–10.3)
Chloride: 95 mmol/L — ABNORMAL LOW (ref 98–111)
Creatinine, Ser: 10.54 mg/dL — ABNORMAL HIGH (ref 0.44–1.00)
GFR calc Af Amer: 4 mL/min — ABNORMAL LOW (ref >60)
GFR calc non Af Amer: 3 mL/min — ABNORMAL LOW (ref >60)
Glucose, Bld: 96 mg/dL (ref 70–99)
Potassium: 3.2 mmol/L — ABNORMAL LOW (ref 3.5–5.1)
Sodium: 138 mmol/L (ref 135–145)
Total Bilirubin: 0.8 mg/dL (ref 0.3–1.2)
Total Protein: 7.4 g/dL (ref 6.5–8.1)

## 2019-08-18 LAB — TROPONIN I (HIGH SENSITIVITY)
Troponin I (High Sensitivity): 29 ng/L — ABNORMAL HIGH (ref <18)
Troponin I (High Sensitivity): 23 ng/L — ABNORMAL HIGH (ref <18)

## 2019-08-18 LAB — D-DIMER, QUANTITATIVE: D-Dimer, Quant: 0.27 ug/mL-FEU (ref 0.00–0.50)

## 2019-08-18 MED ORDER — POTASSIUM CHLORIDE CRYS ER 20 MEQ PO TBCR
20.0000 meq | EXTENDED_RELEASE_TABLET | Freq: Once | ORAL | Status: AC
  Administered 2019-08-18: 20 meq via ORAL
  Filled 2019-08-18: qty 1

## 2019-08-18 MED ORDER — ALBUTEROL SULFATE (2.5 MG/3ML) 0.083% IN NEBU: 3.0000 mL | INHALATION_SOLUTION | RESPIRATORY_TRACT | Status: DC | PRN

## 2019-08-18 MED ORDER — ONDANSETRON HCL 4 MG/2ML IJ SOLN: 4.0000 mg | Freq: Four times a day (QID) | INTRAMUSCULAR | Status: DC | PRN

## 2019-08-18 MED ORDER — CALCITRIOL 0.25 MCG PO CAPS
0.2500 ug | ORAL_CAPSULE | Freq: Every day | ORAL | Status: DC
  Administered 2019-08-19 – 2019-08-21 (×3): 0.25 ug via ORAL
  Filled 2019-08-18 (×3): qty 1

## 2019-08-18 MED ORDER — POTASSIUM CHLORIDE ER 20 MEQ PO TBCR: 20.0000 meq | EXTENDED_RELEASE_TABLET | Freq: Every day | ORAL | Status: DC

## 2019-08-18 MED ORDER — LACTATED RINGERS IV BOLUS
250.0000 mL | Freq: Once | INTRAVENOUS | Status: AC
  Administered 2019-08-18: 250 mL via INTRAVENOUS

## 2019-08-18 MED ORDER — HEPARIN SODIUM (PORCINE) 5000 UNIT/ML IJ SOLN
5000.0000 [IU] | Freq: Three times a day (TID) | INTRAMUSCULAR | Status: DC
  Administered 2019-08-18 – 2019-08-20 (×5): 5000 [IU] via SUBCUTANEOUS
  Filled 2019-08-18 (×6): qty 1

## 2019-08-18 MED ORDER — ACETAMINOPHEN 325 MG PO TABS: 650.0000 mg | ORAL_TABLET | Freq: Four times a day (QID) | ORAL | Status: DC | PRN

## 2019-08-18 MED ORDER — PANTOPRAZOLE SODIUM 40 MG PO TBEC
40.0000 mg | DELAYED_RELEASE_TABLET | Freq: Every day | ORAL | Status: DC
  Administered 2019-08-19 – 2019-08-21 (×3): 40 mg via ORAL
  Filled 2019-08-18 (×3): qty 1

## 2019-08-18 MED ORDER — MELATONIN 5 MG PO TABS
5.0000 mg | ORAL_TABLET | Freq: Every evening | ORAL | Status: DC | PRN
  Filled 2019-08-18: qty 1

## 2019-08-18 MED ORDER — SALINE SPRAY 0.65 % NA SOLN
2.0000 | Freq: Every day | NASAL | Status: DC | PRN
  Filled 2019-08-18: qty 44

## 2019-08-18 MED ORDER — POTASSIUM CHLORIDE CRYS ER 20 MEQ PO TBCR
20.0000 meq | EXTENDED_RELEASE_TABLET | Freq: Every day | ORAL | Status: DC
  Administered 2019-08-19: 20 meq via ORAL
  Filled 2019-08-18: qty 1

## 2019-08-18 MED ORDER — SODIUM CHLORIDE 0.9% FLUSH
3.0000 mL | Freq: Two times a day (BID) | INTRAVENOUS | Status: DC
  Administered 2019-08-18 – 2019-08-21 (×5): 3 mL via INTRAVENOUS

## 2019-08-18 MED ORDER — FLUTICASONE FUROATE-VILANTEROL 100-25 MCG/INH IN AEPB
1.0000 | INHALATION_SPRAY | Freq: Every day | RESPIRATORY_TRACT | Status: DC
  Administered 2019-08-19 – 2019-08-21 (×3): 1 via RESPIRATORY_TRACT
  Filled 2019-08-18: qty 28

## 2019-08-18 MED ORDER — ATORVASTATIN CALCIUM 10 MG PO TABS
10.0000 mg | ORAL_TABLET | Freq: Every day | ORAL | Status: DC
  Administered 2019-08-19 – 2019-08-21 (×3): 10 mg via ORAL
  Filled 2019-08-18 (×3): qty 1

## 2019-08-18 MED ORDER — ONDANSETRON HCL 4 MG PO TABS: 4.0000 mg | ORAL_TABLET | Freq: Four times a day (QID) | ORAL | Status: DC | PRN

## 2019-08-18 MED ORDER — ACETAMINOPHEN 650 MG RE SUPP: 650.0000 mg | Freq: Four times a day (QID) | RECTAL | Status: DC | PRN

## 2019-08-18 MED ORDER — ASPIRIN EC 81 MG PO TBEC
81.0000 mg | DELAYED_RELEASE_TABLET | Freq: Every day | ORAL | Status: DC
  Administered 2019-08-19 – 2019-08-21 (×3): 81 mg via ORAL
  Filled 2019-08-18 (×4): qty 1

## 2019-08-18 MED ORDER — SODIUM CHLORIDE 0.9% FLUSH: 3.0000 mL | INTRAVENOUS | Status: DC | PRN

## 2019-08-18 MED ORDER — MELATONIN 3 MG PO TABS
6.0000 mg | ORAL_TABLET | Freq: Every evening | ORAL | Status: DC | PRN
  Administered 2019-08-20: 6 mg via ORAL
  Filled 2019-08-18 (×2): qty 2

## 2019-08-18 MED ORDER — SODIUM CHLORIDE 0.9 % IV SOLN: 250.0000 mL | INTRAVENOUS | Status: DC | PRN

## 2019-08-18 MED ORDER — SODIUM CHLORIDE 0.9% FLUSH: 3.0000 mL | Freq: Two times a day (BID) | INTRAVENOUS | Status: DC

## 2019-08-18 NOTE — ED Triage Notes (Signed)
Pt presents with c/o shortness of breath that started on Friday. Pt reports pain in her back when she breathes, 6/10. Pt is a dialysis patient, reports that she does dialysis at home every night.

## 2019-08-18 NOTE — H&P (Signed)
History and Physical    [name] OBS:[id number] DOB: [birth year] DOA: 08/18/2019  PCP: [facility] [place]   Patient coming from: Home   Chief Complaint: SOB, lightheaded on standing   HPI: [name] is a 74 y.o. female with medical history significant for ESRD on peritoneal dialysis, emphysema, pulmonary nodules followed by pulmonology, now presenting to the emergency department for evaluation of shortness of breath and lightheadedness upon standing. She was seen for similar complaints multiple times in February, was negative for COVID-19 at that time, had normal D-dimer, reports saturations in the low 80s with her PCP but not started on home oxygen and was saturating in the 90s on room air during her ED visits.  She was orthostatic at that time and symptoms improved with IV fluids.  Her dialysis parameters were reportedly changed.  Lightheadedness and shortness of breath improved and she was feeling better for more than a month until 3 days ago when the same symptoms returned.  She is not coughing, denies wheezing, and denies chest pain, leg swelling, or leg tenderness.  She only becomes lightheaded when standing up and recovers after sitting down.  She has not lost consciousness or fallen.  ED Course: Upon arrival to the ED, patient is found to be afebrile, saturating in the upper 80s to low 90s on room air, and with stable blood pressure.  EKG features sinus rhythm with PVCs.  Chest x-ray demonstrates emphysema and postsurgical changes but no acute findings.  Chemistry panel with potassium 3.2 and BUN 42.  CBC unremarkable. High-sensitivity troponin elevated to 29.  Patient had a 45 bpm increase in heart rate upon standing.  No acute findings on head CT.  She was treated with 250 cc of lactated Ringer's and 20 mEq oral potassium.  Hospitalist asked to admit.  Review of Systems:  All other systems reviewed and apart from HPI, are negative.  Past Medical History:  Diagnosis Date   . Acute kidney injury ([place]) 07/20/2016  . Allergic rhinitis   . Anxiety   . CAP (community acquired pneumonia) 06/2016   [name] 06/19/2016  . Cervical disc disease    Archie Endo 07/20/2016  . Chest pain at rest 06/18/2012  . COPD (chronic obstructive pulmonary disease) ([place]) 05/23/2012  . Diverticulosis   . DJD (degenerative joint disease)   . Esophageal diverticulum   . Esophageal stricture   . GERD (gastroesophageal reflux disease)   . Hiatal hernia   . History of blood transfusion 06/2016   "when I was hospitalized w/sepsis"  . Hyperlipidemia 05/23/2012   T. Chol 234, LDL 130   . Hypertension   . IBS (irritable bowel syndrome)   . Lung nodules    hx  with extensive workup including lung biopsy ruling out Sjogren's disease/notes 07/20/2016  . Osteopenia   . Recurrent sinusitis    [name] 07/20/2016  . Renal insufficiency   . Sinus headache    "daily lately" (07/20/2016)  . Traumatic arthritis     Past Surgical History:  Procedure Laterality Date  . ANTERIOR CERVICAL DECOMP/DISCECTOMY FUSION  2005  . BACK SURGERY    . COLONOSCOPY  2001   Archie Endo 09/21/2010  . CYSTECTOMY Bilateral    hx/notes 09/21/2010  . IR GENERIC HISTORICAL  07/20/2016   IR FLUORO GUIDE CV LINE RIGHT 07/20/2016 [name], MD [facility]  . IR GENERIC HISTORICAL  07/20/2016   IR US GUIDE VASC ACCESS RIGHT 07/20/2016 [name], MD [facility]  . LUNG BIOPSY Left  left side, not cancerous, thoracotomy  . TONSILLECTOMY AND ADENOIDECTOMY     hx/notes 09/21/2010  . VAGINAL HYSTERECTOMY     hx w/oophorectomy/notes 09/21/2010     reports that she quit smoking about 34 years ago. Her smoking use included cigarettes. She has a 0.96 pack-year smoking history. She has never used smokeless tobacco. She reports current alcohol use of about 1.0 standard drinks of alcohol per week. She reports that she does not use drugs.  Allergies  Allergen Reactions  . Ace Inhibitors Anaphylaxis, Swelling and Other (See  Comments)    Angioedema  . Calcium Channel Blockers Anaphylaxis, Itching, Rash and Other (See Comments)    Red rash, Knot (sore and itches), and a fever for a couple days (per pt report)  . Diltiazem Anaphylaxis  . Other Anaphylaxis, Itching, Rash and Other (See Comments)    Calcium Channel Blocking Agent Diltiazem Analogues: Red rash, Knot (sore and itches), and a fever for a couple days (per pt report)  . Pneumococcal Vaccines Other (See Comments)    Red rash, Knot (sore and itches), A fever for a couple days (per pt report)  . Tetanus Toxoid Swelling and Other (See Comments)    FEVER and cellulitis in an arm    . Latex Rash    Reaction to gloves  . Tape Rash and Other (See Comments)    PLEASE USE PAPER TAPE!!    Family History  Problem Relation Age of Onset  . Throat cancer Father   . Hypertension Sister   . Thyroid disease Daughter   . Hypertension Sister        x 2  . Breast cancer Maternal Aunt   . Blindness Sister        legally blind  . Colon cancer Neg Hx   . Stomach cancer Neg Hx      Prior to Admission medications   Medication Sig Start Date End Date Taking? Authorizing Provider  acetaminophen (TYLENOL) 500 MG tablet Take 1,000 mg by mouth every 6 (six) hours as needed (for headaches or pain).    Yes [provider]  aspirin EC 81 MG tablet Take 81 mg by mouth daily.   Yes [provider]  atorvastatin (LIPITOR) 10 MG tablet Take 1 tablet (10 mg total) by mouth daily. 10/20/15  Yes Biagio Borg, MD  BREO ELLIPTA 100-25 MCG/INH AEPB Inhale 1 puff into the lungs daily.  06/30/19  Yes [provider]  calcitRIOL (ROCALTROL) 0.25 MCG capsule Take 0.25 mcg by mouth daily.  03/14/19  Yes [provider]  Cholecalciferol (VITAMIN D3) 50 MCG (2000 UT) TABS Take 4,000 Units by mouth daily.   Yes [provider]  COLACE 100 MG capsule Take 100 mg by mouth daily as needed for mild constipation.  03/14/19  Yes [provider]  ferric citrate (AURYXIA) 1 GM 210 MG(Fe) tablet Take 210 mg by mouth 3 (three) times daily with meals.   Yes [provider]  lactulose (CHRONULAC) 10 GM/15ML solution Take 10 g by mouth daily as needed for mild constipation.   Yes [provider]  loratadine-pseudoephedrine (CLARITIN-D 12 HOUR) 5-120 MG tablet Take 1 tablet by mouth every 12 (twelve) hours.   Yes [provider]  Melatonin 5 MG TABS Take 5 mg by mouth at bedtime as needed (for sleep).    Yes [provider]  multivitamin (RENA-VIT) TABS tablet Take 1 tablet by mouth daily.   Yes [provider]  pantoprazole (  PROTONIX) 40 MG tablet Take 1 tablet (40 mg total) by mouth daily. 10/20/15  Yes Biagio Borg, MD  Potassium Chloride ER 20 MEQ TBCR Take 20 mEq by mouth daily.  05/24/19  Yes [provider]  sodium chloride (OCEAN) 0.65 % SOLN nasal spray Place 2 sprays into both nostrils daily as needed for congestion.    Yes [provider]  torsemide (DEMADEX) 20 MG tablet Take 20 mg by mouth daily.  01/03/18  Yes [provider]    Physical Exam: Vitals:   08/18/19 1730 08/18/19 1745 08/18/19 1800 08/18/19 1830  BP: 100/71  106/76 100/84  Pulse: 88  88 90  Resp: 15 14 15 14   Temp:      TempSrc:      SpO2: 100%  100% 100%    Constitutional: NAD, appears frail   Eyes: PERTLA, lids and conjunctivae normal ENMT: Mucous membranes are moist. Posterior pharynx clear of any exudate or lesions.   Neck: normal, supple, no masses, no thyromegaly Respiratory:  no wheezing, no crackles. No accessory muscle use.  Cardiovascular: S1 & S2 heard, regular rate and rhythm. No extremity edema.   Abdomen: no tenderness, soft. Bowel sounds active.  Musculoskeletal: no clubbing / cyanosis. No joint deformity upper and lower extremities.   Skin: no significant rashes, lesions, ulcers. Warm, dry, well-perfused. Neurologic: No facial asymmetry. Sensation intact. Moving all  extremities.  Psychiatric: Alert and oriented to person, place, and situation. Calm and cooperative.    Labs and Imaging on Admission: I have personally reviewed following labs and imaging studies  CBC: Recent Labs  Lab 08/18/19 1514  WBC 6.7  NEUTROABS 4.4  HGB 12.3  HCT 36.6  MCV 95.8  PLT 782   Basic Metabolic Panel: Recent Labs  Lab 08/18/19 1514  NA 138  K 3.2*  CL 95*  CO2 27  GLUCOSE 96  BUN 42*  CREATININE 10.54*  CALCIUM 9.3   GFR: CrCl cannot be calculated (Unknown ideal weight.). Liver Function Tests: Recent Labs  Lab 08/18/19 1514  AST 30  ALT 26  ALKPHOS 67  BILITOT 0.8  PROT 7.4  ALBUMIN 3.7   No results for input(s): LIPASE, AMYLASE in the last 168 hours. No results for input(s): AMMONIA in the last 168 hours. Coagulation Profile: No results for input(s): INR, PROTIME in the last 168 hours. Cardiac Enzymes: No results for input(s): CKTOTAL, CKMB, CKMBINDEX, TROPONINI in the last 168 hours. BNP (last 3 results) No results for input(s): PROBNP in the last 8760 hours. HbA1C: No results for input(s): HGBA1C in the last 72 hours. CBG: No results for input(s): GLUCAP in the last 168 hours. Lipid Profile: No results for input(s): CHOL, HDL, LDLCALC, TRIG, CHOLHDL, LDLDIRECT in the last 72 hours. Thyroid Function Tests: No results for input(s): TSH, T4TOTAL, FREET4, T3FREE, THYROIDAB in the last 72 hours. Anemia Panel: No results for input(s): VITAMINB12, FOLATE, FERRITIN, TIBC, IRON, RETICCTPCT in the last 72 hours. Urine analysis:    Component Value Date/Time   COLORURINE YELLOW 06/25/2019 2050   APPEARANCEUR HAZY (A) 06/25/2019 2050   LABSPEC 1.016 06/25/2019 2050   PHURINE 5.0 06/25/2019 2050   GLUCOSEU NEGATIVE 06/25/2019 2050   GLUCOSEU NEGATIVE 06/03/2014 1038   HGBUR NEGATIVE 06/25/2019 2050   [place] NEGATIVE 06/25/2019 2050   KETONESUR NEGATIVE 06/25/2019 2050   PROTEINUR NEGATIVE 06/25/2019 2050   UROBILINOGEN 0.2  06/03/2014 1038   NITRITE NEGATIVE 06/25/2019 2050   LEUKOCYTESUR TRACE (A) 06/25/2019 2050   Sepsis Labs: @  LABRCNTIP(procalcitonin:4,lacticidven:4) )No results found for this or any previous visit (from the past 240 hour(s)).   Radiological Exams on Admission: DG Chest 2 View  Result Date: 08/18/2019 CLINICAL DATA:  74 year old female with shortness of breath. EXAM: CHEST - 2 VIEW COMPARISON:  Chest radiograph dated 07/02/2018 and CT dated 07/03/2019 FINDINGS: There is background of emphysema. Postsurgical changes of left upper lobe and interstitial coarsening of the right mid lung field similar to prior radiograph. No new consolidation. There is no pleural effusion or pneumothorax. Stable cardiac silhouette. Atherosclerotic calcification of the aorta. No acute osseous pathology. Degenerative changes of the spine. Two ovoid radiopaque structures in the region of the gastroesophageal junction each measuring approximately 14 mm likely represent ingested pills in the distal esophagus or gastroesophageal junction. No acute osseous pathology. IMPRESSION: 1. No acute cardiopulmonary process. 2. Emphysema. 3. Postsurgical changes of left upper lobe and right mid lung field scarring. 4. Probable two pills in the region of the gastroesophageal junction. Electronically Signed   By: Anner Crete M.D.   On: 08/18/2019 15:06   CT Head Wo Contrast  Result Date: 08/18/2019 CLINICAL DATA:  74 year old female with dizziness. EXAM: CT HEAD WITHOUT CONTRAST TECHNIQUE: Contiguous axial images were obtained from the base of the skull through the vertex without intravenous contrast. COMPARISON:  Head CT dated 12/02/2015. FINDINGS: Brain: Mild age-related atrophy and chronic microvascular ischemic changes. There is no acute intracranial hemorrhage. No mass effect or midline shift. No extra-axial fluid collection. Vascular: No hyperdense vessel or unexpected calcification. Skull: Normal. Negative for fracture or focal  lesion. Sinuses/Orbits: No acute finding. Other: None IMPRESSION: 1. No acute intracranial pathology. 2. Mild age-related atrophy and chronic microvascular ischemic changes. Electronically Signed   By: Anner Crete M.D.   On: 08/18/2019 19:28    EKG: Independently reviewed. Sinus rhythm, PVCs.   Assessment/Plan   1. Orthostasis  - Presents with lightheadedness on standing and found to have 45 bpm increase in HR on standing  - She had similar sxs in February that resolved with gentle IVF and adjustment in PD parameters  - She is receiving a small fluid bolus in ED  - Hold torsemide, repeat orthostatic vitals after gentle hydration, check echocardiogram, consider compression stockings and possibly abdominal binder    2. Acute hypoxic respiratory failure; COPD  - Presents with lightheadedness and SOB without cough, wheezing, fevers/chills, or swelling  - Oxygen saturations in upper 80s to low 90s on rm air while at rest in bed but no tachypnea  - No acute findings on CXR; d-dimer negative; no infectious s/s; not hypervolemic  - Check echocardiogram, continue supplemental O2 as needed, continue inhalers, may need home O2    3. ESRD  - Does PD nightly at home  - No indication for urgent dialysis on admission  - She appears hypovolemic, is receiving a small fluid bolus in ED and torsemide will be held    4. Hypokalemia  - Replaced in ED, repeat chem panel in am    5. Lung nodules  - Continue pulmonology follow-up   6. Elevated troponin  - HS troponin mildly elevated in ED without chest pain - Check delta troponin, continue ASA and statin, echo is ordered as above   DVT prophylaxis: sq heparin  Code Status: Full  Family Communication: Discussed with patient  Disposition Plan: Likely home in 1-2 days if orthostasis improved after gentle IVF and holding diuretic; may need home O2.  Consults called: None  Admission status: Observation  Vianne Bulls, MD [facility] Pager: See [email or web address]  If 7AM-7PM, please contact the daytime attending [email or web address]  08/18/2019, 7:43 PM

## 2019-08-18 NOTE — ED Notes (Signed)
[facility] dispatch notified for need of transport.  

## 2019-08-18 NOTE — ED Notes (Signed)
This RN attempted to call report to floor nurse x2. Secretary states nurse is busy in another room.

## 2019-08-18 NOTE — ED Notes (Signed)
This RN attempted to call report to floor nurse x1.

## 2019-08-18 NOTE — ED Provider Notes (Signed)
[facility] DEPT Provider Note   CSN: [id number] Arrival date & time: 08/18/19  1355     History Chief Complaint  Patient presents with  . Shortness of Breath    [name] is a 74 y.o. female.  HPI      [name] is a 74 y.o. female, with a history of COPD, GERD, HTN, ESRD on peritoneal dialysis, presenting to the ED with shortness of breath beginning two days ago.  She notes pain to the right chest and the right upper back with deep breathing.  She states she was seen in the ED February 24 for generalized weakness and shortness of breath.  They determined the patient was dehydrated and afterward she was instructed to reduce the amount of fluid she exchanged with her dialysis.  Her symptoms improved after she made this change and she felt back to normal until 2 days ago. She also notes lightheadedness with standing and walking. She is on peritoneal dialysis with her last session last night.  She was able to complete the session without difficulty. Denies fever/chills, cough, other chest pain, abdominal pain, neurologic deficits, syncope, N/V/D, lower extremity edema/pain, or any other complaints.  Past Medical History:  Diagnosis Date  . Acute kidney injury ([place]) 07/20/2016  . Allergic rhinitis   . Anxiety   . CAP (community acquired pneumonia) 06/2016   [name] 06/19/2016  . Cervical disc disease    Archie Endo 07/20/2016  . Chest pain at rest 06/18/2012  . COPD (chronic obstructive pulmonary disease) ([place]) 05/23/2012  . Diverticulosis   . DJD (degenerative joint disease)   . Esophageal diverticulum   . Esophageal stricture   . GERD (gastroesophageal reflux disease)   . Hiatal hernia   . History of blood transfusion 06/2016   "when I was hospitalized w/sepsis"  . Hyperlipidemia 05/23/2012   T. Chol 234, LDL 130   . Hypertension   . IBS (irritable bowel syndrome)   . Lung nodules    hx  with extensive workup including lung biopsy  ruling out Sjogren's disease/notes 07/20/2016  . Osteopenia   . Recurrent sinusitis    [name] 07/20/2016  . Renal insufficiency   . Sinus headache    "daily lately" (07/20/2016)  . Traumatic arthritis     Patient Active Problem List   Diagnosis Date Noted  . Orthostasis 08/18/2019  . Protein-calorie malnutrition, severe 02/06/2018  . Peritonitis ([place]) 02/05/2018  . Hypokalemia 02/05/2018  . Pulmonary nodules   . ESRD (end stage renal disease) on dialysis ([place])   . 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 Hyperphosphatemia 07/25/2016  . COPD (chronic obstructive pulmonary disease) ([place]) 05/23/2012  . Acute respiratory failure with hypoxia ([place]) 05/23/2012  . Essential hypertension 12/25/2007    Past Surgical History:  Procedure Laterality Date  . ANTERIOR CERVICAL DECOMP/DISCECTOMY FUSION  2005  . BACK SURGERY    . COLONOSCOPY  2001   Archie Endo 09/21/2010  . CYSTECTOMY Bilateral    hx/notes 09/21/2010  . IR GENERIC HISTORICAL  07/20/2016   IR FLUORO GUIDE CV LINE RIGHT 07/20/2016 [name], MD [facility]  . IR GENERIC HISTORICAL  07/20/2016   IR US GUIDE VASC ACCESS RIGHT 07/20/2016 [name], MD [facility]  . LUNG BIOPSY Left    left side, not cancerous, thoracotomy  . TONSILLECTOMY AND ADENOIDECTOMY     hx/notes 09/21/2010  . VAGINAL HYSTERECTOMY     hx w/oophorectomy/notes 09/21/2010  OB History   No obstetric history on file.     Family History  Problem Relation Age of Onset  . Throat cancer Father   . Hypertension Sister   . Thyroid disease Daughter   . Hypertension Sister        x 2  . Breast cancer Maternal Aunt   . Blindness Sister        legally blind  . Colon cancer Neg Hx   . Stomach cancer Neg Hx     Social History   Tobacco Use  . Smoking status: Former Smoker    Packs/day: 0.12    Years: 8.00    Pack years: 0.96    Types: Cigarettes    Quit date: 05/04/1985    Years  since quitting: 34.3  . Smokeless tobacco: Never Used  Substance Use Topics  . Alcohol use: Yes    Alcohol/week: 1.0 standard drinks    Types: 1 Cans of beer per week  . Drug use: No    Home Medications Prior to Admission medications   Medication Sig Start Date End Date Taking? Authorizing Provider  acetaminophen (TYLENOL) 500 MG tablet Take 1,000 mg by mouth every 6 (six) hours as needed (for headaches or pain).    Yes [provider]  aspirin EC 81 MG tablet Take 81 mg by mouth daily.   Yes [provider]  atorvastatin (LIPITOR) 10 MG tablet Take 1 tablet (10 mg total) by mouth daily. 10/20/15  Yes Biagio Borg, MD  BREO ELLIPTA 100-25 MCG/INH AEPB Inhale 1 puff into the lungs daily.  06/30/19  Yes [provider]  calcitRIOL (ROCALTROL) 0.25 MCG capsule Take 0.25 mcg by mouth daily.  03/14/19  Yes [provider]  Cholecalciferol (VITAMIN D3) 50 MCG (2000 UT) TABS Take 4,000 Units by mouth daily.   Yes [provider]  COLACE 100 MG capsule Take 100 mg by mouth daily as needed for mild constipation.  03/14/19  Yes [provider]  ferric citrate (AURYXIA) 1 GM 210 MG(Fe) tablet Take 210 mg by mouth 3 (three) times daily with meals.   Yes [provider]  lactulose (CHRONULAC) 10 GM/15ML solution Take 10 g by mouth daily as needed for mild constipation.   Yes [provider]  loratadine-pseudoephedrine (CLARITIN-D 12 HOUR) 5-120 MG tablet Take 1 tablet by mouth every 12 (twelve) hours.   Yes [provider]  Melatonin 5 MG TABS Take 5 mg by mouth at bedtime as needed (for sleep).    Yes [provider]  multivitamin (RENA-VIT) TABS tablet Take 1 tablet by mouth daily.   Yes [provider]  pantoprazole (PROTONIX) 40 MG tablet Take 1 tablet (40 mg total) by mouth daily. 10/20/15  Yes Biagio Borg, MD  Potassium Chloride ER 20 MEQ TBCR Take 20 mEq by mouth daily.  05/24/19  Yes [provider]  sodium chloride (OCEAN) 0.65 % SOLN nasal spray Place 2 sprays into both nostrils daily as needed for congestion.    Yes [provider]  torsemide (DEMADEX) 20 MG tablet Take 20 mg by mouth daily.  01/03/18  Yes [provider]    Allergies    Ace inhibitors, Calcium channel blockers, Diltiazem, Other, Pneumococcal vaccines, Tetanus toxoid, Latex, and Tape  Review of Systems   Review of Systems  Constitutional: Negative for chills, diaphoresis and fever.  Respiratory: Positive for shortness of breath. Negative for cough.   Gastrointestinal: Negative for abdominal pain, diarrhea,  nausea and vomiting.  Genitourinary: Negative for dysuria.  Neurological: Positive for weakness (generalized) and light-headedness. Negative for syncope, numbness and headaches.  All other systems reviewed and are negative.   Physical Exam Updated Vital Signs BP 117/72 (BP Location: Left Arm)   Pulse 96   Temp 98 F (36.7 C) (Oral)   Resp 20   SpO2 92%   Physical Exam Vitals and nursing note reviewed.  Constitutional:      General: She is not in acute distress.    Appearance: She is underweight. She is not diaphoretic.  HENT:     Head: Normocephalic and atraumatic.     Nose: Nose normal.     Mouth/Throat:     Mouth: Mucous membranes are moist.     Pharynx: Oropharynx is clear.  Eyes:     Extraocular Movements: Extraocular movements intact.     Conjunctiva/sclera: Conjunctivae normal.     Pupils: Pupils are equal, round, and reactive to light.  Cardiovascular:     Rate and Rhythm: Normal rate and regular rhythm.     Pulses: Normal pulses.          Radial pulses are 2+ on the right side and 2+ on the left side.       Posterior tibial pulses are 2+ on the right side and 2+ on the left side.     Heart sounds: Normal heart sounds.     Comments: Tactile temperature in the extremities appropriate and equal bilaterally. Pulmonary:     Effort: Pulmonary effort is  normal. No respiratory distress.     Breath sounds: Normal breath sounds.     Comments: SPO2 down to 90% on room air during my evaluation.  Requires 2 L supplemental O2 to obtain SPO2 of 95 to 97%. Abdominal:     Palpations: Abdomen is soft.     Tenderness: There is no abdominal tenderness. There is no guarding.  Musculoskeletal:     Cervical back: Neck supple.     Right lower leg: No edema.     Left lower leg: No edema.  Lymphadenopathy:     Cervical: No cervical adenopathy.  Skin:    General: Skin is warm and dry.  Neurological:     Mental Status: She is alert.     Comments: No noted acute cognitive deficit. Sensation grossly intact to light touch in the extremities.   Grip strengths equal bilaterally.   Strength 5/5 in all extremities.  Coordination intact.  Patient was able to sit up on the edge of the bed without onset of additional symptoms.,  However, when she stood she complained of lightheadedness. Cranial nerves III-XII grossly intact.  Handles oral secretions without noted difficulty.  No noted phonation or speech deficit. No facial droop.   Psychiatric:        Mood and Affect: Mood and affect normal.        Speech: Speech normal.        Behavior: Behavior normal.     ED Results / Procedures / Treatments   Labs (all labs ordered are listed, but only abnormal results are displayed) Labs Reviewed  COMPREHENSIVE METABOLIC PANEL - Abnormal; Notable for the following components:      Result Value   Potassium 3.2 (*)    Chloride 95 (*)    BUN 42 (*)    Creatinine, Ser 10.54 (*)    GFR calc non Af Amer 3 (*)    GFR calc Af Amer 4 (*)  Anion gap 16 (*)    All other components within normal limits  CBC WITH DIFFERENTIAL/PLATELET - Abnormal; Notable for the following components:   RBC 3.82 (*)    All other components within normal limits  TROPONIN I (HIGH SENSITIVITY) - Abnormal; Notable for the following components:   Troponin I (High Sensitivity) 29 (*)    All  other components within normal limits  SARS CORONAVIRUS 2 (TAT 6-24 HRS)  D-DIMER, QUANTITATIVE (NOT AT [facility])  TROPONIN I (HIGH SENSITIVITY)    EKG EKG Interpretation  Date/Time:  Sunday August 18 2019 14:14:30 EDT Ventricular Rate:  94 PR Interval:    QRS Duration: 98 QT Interval:  378 QTC Calculation: 473 R Axis:   -19 Text Interpretation: Sinus rhythm Premature ventricular complexes Borderline left axis deviation Probable anteroseptal infarct, old substantial artefact Abnormal ECG Confirmed by Carmin Muskrat [phone]) on 08/18/2019 2:47:01 PM   Radiology DG Chest 2 View  Result Date: 08/18/2019 CLINICAL DATA:  74 year old female with shortness of breath. EXAM: CHEST - 2 VIEW COMPARISON:  Chest radiograph dated 07/02/2018 and CT dated 07/03/2019 FINDINGS: There is background of emphysema. Postsurgical changes of left upper lobe and interstitial coarsening of the right mid lung field similar to prior radiograph. No new consolidation. There is no pleural effusion or pneumothorax. Stable cardiac silhouette. Atherosclerotic calcification of the aorta. No acute osseous pathology. Degenerative changes of the spine. Two ovoid radiopaque structures in the region of the gastroesophageal junction each measuring approximately 14 mm likely represent ingested pills in the distal esophagus or gastroesophageal junction. No acute osseous pathology. IMPRESSION: 1. No acute cardiopulmonary process. 2. Emphysema. 3. Postsurgical changes of left upper lobe and right mid lung field scarring. 4. Probable two pills in the region of the gastroesophageal junction. Electronically Signed   By: Anner Crete M.D.   On: 08/18/2019 15:06   CT Head Wo Contrast  Result Date: 08/18/2019 CLINICAL DATA:  74 year old female with dizziness. EXAM: CT HEAD WITHOUT CONTRAST TECHNIQUE: Contiguous axial images were obtained from the base of the skull through the vertex without intravenous contrast. COMPARISON:  Head CT dated  12/02/2015. FINDINGS: Brain: Mild age-related atrophy and chronic microvascular ischemic changes. There is no acute intracranial hemorrhage. No mass effect or midline shift. No extra-axial fluid collection. Vascular: No hyperdense vessel or unexpected calcification. Skull: Normal. Negative for fracture or focal lesion. Sinuses/Orbits: No acute finding. Other: None IMPRESSION: 1. No acute intracranial pathology. 2. Mild age-related atrophy and chronic microvascular ischemic changes. Electronically Signed   By: Anner Crete M.D.   On: 08/18/2019 19:28    Procedures Procedures (including critical care time)  Medications Ordered in ED Medications  lactated ringers bolus 250 mL (250 mLs Intravenous New Bag/Given 08/18/19 1930)  potassium chloride SA (KLOR-CON) CR tablet 20 mEq (20 mEq Oral Given 08/18/19 1930)    ED Course  I have reviewed the triage vital signs and the nursing notes.  Pertinent labs & imaging results that were available during my care of the patient were reviewed by me and considered in my medical decision making (see chart for details).  Clinical Course as of Aug 18 1946  Sun Aug 18, 2019  1919 Spoke with Dr. Myna Hidalgo, hospitalist. Agrees to admit the patient.    [SJ]    Clinical Course User Index [SJ] Amritha Yorke, Helane Gunther, PA-C   MDM Rules/Calculators/A&P                      Patient presents primarily complaining  of shortness of breath.  She has noted to have some hypoxia down to 90% on room air (when compared to previous values in her chart this is much lower), requiring 2 L supplemental O2 to maintain adequate SPO2.  Nontoxic-appearing, not tachycardic, afebrile.  No acute abnormalities on chest x-ray. Very mild elevation in troponin likely due to patient's status as a renal failure patient.  No significant noted EKG abnormalities. Mild hypokalemia noted and addressed.  This was only addressed in the ESRD patient because patient additionally had signs that she was volume low  and it is conceivable that she has an actual hypokalemia. D-dimer negative. Dissection was considered, but thought less likely base on: History and description of the pain are not suggestive, patient is not ill-appearing, lack of risk factors, equal bilateral pulses, lack of neurologic deficits, no widened mediastinum on chest x-ray.  Orthostatic changes noted with pulse increase upon standing.  Gentle hydration ordered. Patient admitted for further assessment and management. Patient was noted to have tested positive for Covid June 17, 2019, therefore, this test was not repeated because it is within 90 days.   I personally reviewed and interpreted the patient's labs and imaging studies.   Findings and plan of care discussed with Adrian Prows, MD. Dr. Vanita Panda personally evaluated and examined this patient.  Vitals:   08/18/19 1730 08/18/19 1745 08/18/19 1800 08/18/19 1830  BP: 100/71  106/76 100/84  Pulse: 88  88 90  Resp: 15 14 15 14   Temp:      TempSrc:      SpO2: 100%  100% 100%    Orthostatic VS for the past 24 hrs:  BP- Lying Pulse- Lying BP- Sitting Pulse- Sitting BP- Standing at 0 minutes Pulse- Standing at 0 minutes  08/18/19 1838 104/74 85 108/79 92 (!) 133/99 130    Final Clinical Impression(s) / ED Diagnoses Final diagnoses:  None    Rx / DC Orders ED Discharge Orders    None       Layla Maw 08/18/19 1948    Carmin Muskrat, MD 08/18/19 2058

## 2019-08-19 ENCOUNTER — Observation Stay (HOSPITAL_BASED_OUTPATIENT_CLINIC_OR_DEPARTMENT_OTHER): Payer: Medicare HMO

## 2019-08-19 ENCOUNTER — Ambulatory Visit (HOSPITAL_BASED_OUTPATIENT_CLINIC_OR_DEPARTMENT_OTHER): Payer: Medicare HMO

## 2019-08-19 DIAGNOSIS — Z992 Dependence on renal dialysis: Secondary | ICD-10-CM | POA: Diagnosis not present

## 2019-08-19 DIAGNOSIS — I951 Orthostatic hypotension: Secondary | ICD-10-CM

## 2019-08-19 DIAGNOSIS — E876 Hypokalemia: Secondary | ICD-10-CM | POA: Diagnosis not present

## 2019-08-19 DIAGNOSIS — J9601 Acute respiratory failure with hypoxia: Secondary | ICD-10-CM | POA: Diagnosis not present

## 2019-08-19 DIAGNOSIS — R0602 Shortness of breath: Secondary | ICD-10-CM

## 2019-08-19 DIAGNOSIS — N186 End stage renal disease: Secondary | ICD-10-CM | POA: Diagnosis not present

## 2019-08-19 LAB — BASIC METABOLIC PANEL
Anion gap: 17 — ABNORMAL HIGH (ref 5–15)
BUN: 42 mg/dL — ABNORMAL HIGH (ref 8–23)
CO2: 23 mmol/L (ref 22–32)
Calcium: 8.9 mg/dL (ref 8.9–10.3)
Chloride: 98 mmol/L (ref 98–111)
Creatinine, Ser: 10.57 mg/dL — ABNORMAL HIGH (ref 0.44–1.00)
GFR calc Af Amer: 4 mL/min — ABNORMAL LOW (ref >60)
GFR calc non Af Amer: 3 mL/min — ABNORMAL LOW (ref >60)
Glucose, Bld: 86 mg/dL (ref 70–99)
Potassium: 3.8 mmol/L (ref 3.5–5.1)
Sodium: 138 mmol/L (ref 135–145)

## 2019-08-19 LAB — ECHOCARDIOGRAM COMPLETE
Height: 62 in
Weight: 1407.42 oz

## 2019-08-19 LAB — CBC
HCT: 31.2 % — ABNORMAL LOW (ref 36.0–46.0)
Hemoglobin: 10.4 g/dL — ABNORMAL LOW (ref 12.0–15.0)
MCH: 32 pg (ref 26.0–34.0)
MCHC: 33.3 g/dL (ref 30.0–36.0)
MCV: 96 fL (ref 80.0–100.0)
Platelets: 255 10*3/uL (ref 150–400)
RBC: 3.25 MIL/uL — ABNORMAL LOW (ref 3.87–5.11)
RDW: 13.6 % (ref 11.5–15.5)
WBC: 6.6 10*3/uL (ref 4.0–10.5)
nRBC: 0 % (ref 0.0–0.2)

## 2019-08-19 LAB — SARS CORONAVIRUS 2 (TAT 6-24 HRS): SARS Coronavirus 2: NEGATIVE

## 2019-08-19 LAB — MAGNESIUM: Magnesium: 1.9 mg/dL (ref 1.7–2.4)

## 2019-08-19 MED ORDER — RENA-VITE PO TABS
1.0000 | ORAL_TABLET | Freq: Every day | ORAL | Status: DC
  Administered 2019-08-19 – 2019-08-20 (×2): 1 via ORAL
  Filled 2019-08-19 (×2): qty 1

## 2019-08-19 MED ORDER — DELFLEX-LC/1.5% DEXTROSE 344 MOSM/L IP SOLN: INTRAPERITONEAL | Status: DC

## 2019-08-19 MED ORDER — POTASSIUM CHLORIDE CRYS ER 20 MEQ PO TBCR
40.0000 meq | EXTENDED_RELEASE_TABLET | Freq: Every day | ORAL | Status: DC
  Administered 2019-08-20 – 2019-08-21 (×2): 40 meq via ORAL
  Filled 2019-08-19 (×2): qty 2

## 2019-08-19 MED ORDER — HEPARIN 1000 UNIT/ML FOR PERITONEAL DIALYSIS
INTRAPERITONEAL | Status: DC | PRN
  Filled 2019-08-19: qty 3000

## 2019-08-19 MED ORDER — HEPARIN 1000 UNIT/ML FOR PERITONEAL DIALYSIS: 500.0000 [IU] | INTRAMUSCULAR | Status: DC | PRN

## 2019-08-19 MED ORDER — SODIUM CHLORIDE 0.9 % IV BOLUS
250.0000 mL | Freq: Once | INTRAVENOUS | Status: AC
  Administered 2019-08-19: 250 mL via INTRAVENOUS

## 2019-08-19 MED ORDER — GENTAMICIN SULFATE 0.1 % EX CREA
1.0000 "application " | TOPICAL_CREAM | Freq: Every day | CUTANEOUS | Status: DC
  Administered 2019-08-20 – 2019-08-21 (×2): 1 via TOPICAL
  Filled 2019-08-19: qty 15

## 2019-08-19 MED ORDER — SODIUM CHLORIDE 0.9 % IV SOLN: INTRAVENOUS | Status: DC

## 2019-08-19 MED ORDER — SODIUM CHLORIDE 0.9 % IV BOLUS
500.0000 mL | Freq: Once | INTRAVENOUS | Status: AC
  Administered 2019-08-19: 500 mL via INTRAVENOUS

## 2019-08-19 NOTE — Consult Note (Addendum)
[facility] Renal Consultation Note    Indication for Consultation:  Management of ESRD/hemodialysis; anemia, hypertension/volume and secondary hyperparathyroidism PCP: [facility]  HPI: [name] is a 74 y.o. 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 On CCPD since August 2018. Primary nephrologist: Joelyn Oms.  Past medical history significant for HTN, HLD, COPD GI bleed, COVID 19 January, 2021, pulmonary nodules S/P lung biopsy 07/20/2016.   Per patient, she has had ongoing issues with sinus infections. Has been seen by multiple providers with courses of ABX and steroids without improvement. She had positive COVID 19 antibody test in February, 2021. She reports worsening SOB/dizziness and hypotension since February 2021 with multiple ED visits, none of which found origin of her issues. Her CCPD Rx was changed to 4 exchanges of 1.5% but she has been using 3 exchanges of 1.5% and 1 exchange of 2.5%. She has not been on torsemide as OP. She reports home BPs 98-88/62-70s. She self discontinued amlodipine 10 mg PO daily D/T hypotension. She says she hasn't been able to do housework, has to have assistance to lift PD bags.   ED Course: Labs unremarkable except for mild hyperkalemia, not uncommon in PD patients. CXR without evidence of volume overload. She has been admitted as observation patient for evaluation of orthostasis/acute hypoxic respiratory failure D/T low O2 sats on room air in ED.   Past Medical History:  Diagnosis Date  . Acute kidney injury ([place]) 07/20/2016  . Allergic rhinitis   . Anxiety   . CAP (community acquired pneumonia) 06/2016   [name] 06/19/2016  . Cervical disc disease    Archie Endo 07/20/2016  . Chest pain at rest 06/18/2012  . COPD (chronic obstructive pulmonary disease) ([place]) 05/23/2012  . Diverticulosis   . DJD (degenerative joint disease)   . Esophageal diverticulum   . Esophageal stricture   . GERD (gastroesophageal reflux disease)   .  Hiatal hernia   . History of blood transfusion 06/2016   "when I was hospitalized w/sepsis"  . Hyperlipidemia 05/23/2012   T. Chol 234, LDL 130   . Hypertension   . IBS (irritable bowel syndrome)   . Lung nodules    hx  with extensive workup including lung biopsy ruling out Sjogren's disease/notes 07/20/2016  . Osteopenia   . Recurrent sinusitis    [name] 07/20/2016  . Renal insufficiency   . Sinus headache    "daily lately" (07/20/2016)  . Traumatic arthritis    Past Surgical History:  Procedure Laterality Date  . ANTERIOR CERVICAL DECOMP/DISCECTOMY FUSION  2005  . BACK SURGERY    . COLONOSCOPY  2001   Archie Endo 09/21/2010  . CYSTECTOMY Bilateral    hx/notes 09/21/2010  . IR GENERIC HISTORICAL  07/20/2016   IR FLUORO GUIDE CV LINE RIGHT 07/20/2016 [name], MD [facility]  . IR GENERIC HISTORICAL  07/20/2016   IR US GUIDE VASC ACCESS RIGHT 07/20/2016 [name], MD [facility]  . LUNG BIOPSY Left    left side, not cancerous, thoracotomy  . TONSILLECTOMY AND ADENOIDECTOMY     hx/notes 09/21/2010  . VAGINAL HYSTERECTOMY     hx w/oophorectomy/notes 09/21/2010   Family History  Problem Relation Age of Onset  . Throat cancer Father   . Hypertension Sister   . Thyroid disease Daughter   . Hypertension Sister        x 2  . Breast cancer Maternal Aunt   . Blindness Sister        legally blind  .  Colon cancer Neg Hx   . Stomach cancer Neg Hx    Social History:  reports that she quit smoking about 34 years ago. Her smoking use included cigarettes. She has a 0.96 pack-year smoking history. She has never used smokeless tobacco. She reports current alcohol use of about 1.0 standard drinks of alcohol per week. She reports that she does not use drugs. Allergies  Allergen Reactions  . Ace Inhibitors Anaphylaxis, Swelling and Other (See Comments)    Angioedema  . Calcium Channel Blockers Anaphylaxis, Itching, Rash and Other (See Comments)    Red rash, Knot (sore and itches), and a  fever for a couple days (per pt report)  . Diltiazem Anaphylaxis  . Other Anaphylaxis, Itching, Rash and Other (See Comments)    Calcium Channel Blocking Agent Diltiazem Analogues: Red rash, Knot (sore and itches), and a fever for a couple days (per pt report)  . Pneumococcal Vaccines Other (See Comments)    Red rash, Knot (sore and itches), A fever for a couple days (per pt report)  . Tetanus Toxoid Swelling and Other (See Comments)    FEVER and cellulitis in an arm    . Latex Rash    Reaction to gloves  . Tape Rash and Other (See Comments)    PLEASE USE PAPER TAPE!!   Prior to Admission medications   Medication Sig Start Date End Date Taking? Authorizing Provider  acetaminophen (TYLENOL) 500 MG tablet Take 1,000 mg by mouth every 6 (six) hours as needed (for headaches or pain).    Yes [provider]  aspirin EC 81 MG tablet Take 81 mg by mouth daily.   Yes [provider]  atorvastatin (LIPITOR) 10 MG tablet Take 1 tablet (10 mg total) by mouth daily. 10/20/15  Yes Biagio Borg, MD  BREO ELLIPTA 100-25 MCG/INH AEPB Inhale 1 puff into the lungs daily.  06/30/19  Yes [provider]  calcitRIOL (ROCALTROL) 0.25 MCG capsule Take 0.25 mcg by mouth daily.  03/14/19  Yes [provider]  Cholecalciferol (VITAMIN D3) 50 MCG (2000 UT) TABS Take 4,000 Units by mouth daily.   Yes [provider]  COLACE 100 MG capsule Take 100 mg by mouth daily as needed for mild constipation.  03/14/19  Yes [provider]  ferric citrate (AURYXIA) 1 GM 210 MG(Fe) tablet Take 210 mg by mouth 3 (three) times daily with meals.   Yes [provider]  lactulose (CHRONULAC) 10 GM/15ML solution Take 10 g by mouth daily as needed for mild constipation.   Yes [provider]  loratadine-pseudoephedrine (CLARITIN-D 12 HOUR) 5-120 MG tablet Take 1 tablet by mouth every 12 (twelve) hours.   Yes [provider]  Melatonin 5 MG TABS Take 5 mg by  mouth at bedtime as needed (for sleep).    Yes [provider]  multivitamin (RENA-VIT) TABS tablet Take 1 tablet by mouth daily.   Yes [provider]  pantoprazole (PROTONIX) 40 MG tablet Take 1 tablet (40 mg total) by mouth daily. 10/20/15  Yes Biagio Borg, MD  Potassium Chloride ER 20 MEQ TBCR Take 20 mEq by mouth daily.  05/24/19  Yes [provider]  sodium chloride (OCEAN) 0.65 % SOLN nasal spray Place 2 sprays into both nostrils daily as needed for congestion.    Yes [provider]  torsemide (DEMADEX) 20 MG tablet Take 20 mg by mouth daily.  01/03/18  Yes [provider]   Current Facility-Administered Medications  Medication Dose Route Frequency Provider Last Rate Last Admin  . 0.9 %  sodium chloride infusion  250 mL Intravenous PRN Opyd, Ilene Qua, MD      . 0.9 %  sodium chloride infusion   Intravenous Continuous Elmarie Shiley, MD 75 mL/hr at 08/19/19 1017 New Bag at 08/19/19 1017  . acetaminophen (TYLENOL) tablet 650 mg  650 mg Oral Q6H PRN Opyd, Ilene Qua, MD       Or  . acetaminophen (TYLENOL) suppository 650 mg  650 mg Rectal Q6H PRN Opyd, Ilene Qua, MD      . albuterol (PROVENTIL) (2.5 MG/3ML) 0.083% nebulizer solution 3 mL  3 mL Inhalation Q4H PRN Opyd, Ilene Qua, MD      . aspirin EC tablet 81 mg  81 mg Oral Daily Opyd, Ilene Qua, MD   81 mg at 08/19/19 1011  . atorvastatin (LIPITOR) tablet 10 mg  10 mg Oral q1800 Opyd, Ilene Qua, MD      . calcitRIOL (ROCALTROL) capsule 0.25 mcg  0.25 mcg Oral Daily Opyd, Ilene Qua, MD   0.25 mcg at 08/19/19 1011  . dialysis solution 1.5% low-MG/low-CA dianeal solution   Intraperitoneal Q24H Elmarie Shiley, MD      . fluticasone furoate-vilanterol (BREO ELLIPTA) 100-25 MCG/INH 1 puff  1 puff Inhalation Daily Opyd, Ilene Qua, MD   1 puff at 08/19/19 0850  . gentamicin cream (GARAMYCIN) 0.1 % 1 application  1 application Topical Daily Elmarie Shiley, MD      . heparin 1,500 Units in dialysis solution 1.5%  low-MG/low-CA 3,000 mL dialysis solution   Peritoneal Dialysis PRN Modena Jansky, MD      . heparin injection 5,000 Units  5,000 Units Subcutaneous Q8H Opyd, Ilene Qua, MD   5,000 Units at 08/19/19 1331  . melatonin tablet 6 mg  6 mg Oral QHS PRN Opyd, Ilene Qua, MD      . ondansetron (ZOFRAN) tablet 4 mg  4 mg Oral Q6H PRN Opyd, Ilene Qua, MD       Or  . ondansetron (ZOFRAN) injection 4 mg  4 mg Intravenous Q6H PRN Opyd, Ilene Qua, MD      . pantoprazole (PROTONIX) EC tablet 40 mg  40 mg Oral Daily Opyd, Ilene Qua, MD   40 mg at 08/19/19 1011  . potassium chloride SA (KLOR-CON) CR tablet 20 mEq  20 mEq Oral Daily Opyd, Ilene Qua, MD   20 mEq at 08/19/19 1011  . sodium chloride (OCEAN) 0.65 % nasal spray 2 spray  2 spray Each Nare Daily PRN Opyd, Ilene Qua, MD      . sodium chloride flush (NS) 0.9 % injection 3 mL  3 mL Intravenous Q12H Opyd, Timothy S, MD      . sodium chloride flush (NS) 0.9 % injection 3 mL  3 mL Intravenous Q12H Opyd, Ilene Qua, MD   3 mL at 08/18/19 2257  . sodium chloride flush (NS) 0.9 % injection 3 mL  3 mL Intravenous PRN Vianne Bulls, MD       Labs: Basic Metabolic Panel: Recent Labs  Lab 08/18/19 1514 08/19/19 0539  NA 138 138  K 3.2* 3.8  CL 95* 98  CO2 27 23  GLUCOSE 96 86  BUN 42* 42*  CREATININE 10.54* 10.57*  CALCIUM 9.3 8.9   Liver Function Tests: Recent Labs  Lab 08/18/19 1514  AST 30  ALT 26  ALKPHOS 67  BILITOT 0.8  PROT 7.4  ALBUMIN 3.7  No results for input(s): LIPASE, AMYLASE in the last 168 hours. No results for input(s): AMMONIA in the last 168 hours. CBC: Recent Labs  Lab 08/18/19 1514 08/19/19 0539  WBC 6.7 6.6  NEUTROABS 4.4  --   HGB 12.3 10.4*  HCT 36.6 31.2*  MCV 95.8 96.0  PLT 265 255   Cardiac Enzymes: No results for input(s): CKTOTAL, CKMB, CKMBINDEX, TROPONINI in the last 168 hours. CBG: No results for input(s): GLUCAP in the last 168 hours. Iron Studies: No results for input(s): IRON, TIBC,  TRANSFERRIN, FERRITIN in the last 72 hours. Studies/Results: DG Chest 2 View  Result Date: 08/18/2019 CLINICAL DATA:  74 year old female with shortness of breath. EXAM: CHEST - 2 VIEW COMPARISON:  Chest radiograph dated 07/02/2018 and CT dated 07/03/2019 FINDINGS: There is background of emphysema. Postsurgical changes of left upper lobe and interstitial coarsening of the right mid lung field similar to prior radiograph. No new consolidation. There is no pleural effusion or pneumothorax. Stable cardiac silhouette. Atherosclerotic calcification of the aorta. No acute osseous pathology. Degenerative changes of the spine. Two ovoid radiopaque structures in the region of the gastroesophageal junction each measuring approximately 14 mm likely represent ingested pills in the distal esophagus or gastroesophageal junction. No acute osseous pathology. IMPRESSION: 1. No acute cardiopulmonary process. 2. Emphysema. 3. Postsurgical changes of left upper lobe and right mid lung field scarring. 4. Probable two pills in the region of the gastroesophageal junction. Electronically Signed   By: Anner Crete M.D.   On: 08/18/2019 15:06   CT Head Wo Contrast  Result Date: 08/18/2019 CLINICAL DATA:  74 year old female with dizziness. EXAM: CT HEAD WITHOUT CONTRAST TECHNIQUE: Contiguous axial images were obtained from the base of the skull through the vertex without intravenous contrast. COMPARISON:  Head CT dated 12/02/2015. FINDINGS: Brain: Mild age-related atrophy and chronic microvascular ischemic changes. There is no acute intracranial hemorrhage. No mass effect or midline shift. No extra-axial fluid collection. Vascular: No hyperdense vessel or unexpected calcification. Skull: Normal. Negative for fracture or focal lesion. Sinuses/Orbits: No acute finding. Other: None IMPRESSION: 1. No acute intracranial pathology. 2. Mild age-related atrophy and chronic microvascular ischemic changes. Electronically Signed   By: Anner Crete M.D.   On: 08/18/2019 19:28   ECHOCARDIOGRAM COMPLETE  Result Date: 08/19/2019    ECHOCARDIOGRAM REPORT   Patient Name:   CHRISTAL LAGERSTROM Date of Exam: 08/19/2019 Medical Rec #:  [id number]        Height:       62.0 in Accession #:    [id number]       Weight:       88.0 lb Date of Birth:  1946/01/06        BSA:          1.348 m Patient Age:    30 years         BP:           98/73 mmHg Patient Gender: F                HR:           95 bpm. Exam Location:  Inpatient Procedure: 2D Echo Indications:    dyspnea 786.09  History:        Patient has no prior history of Echocardiogram examinations.                 COPD; Risk Factors:Hypertension, Dyslipidemia and Former Smoker.  Sonographer:    Jannett Celestine RDCS (AE) Referring Phys:  [id number] EPPIRJJ S OPYD IMPRESSIONS  1. Left ventricular ejection fraction, by estimation, is 60 to 65%. The left ventricle has normal function. The left ventricle has no regional wall motion abnormalities. Left ventricular diastolic parameters are consistent with Grade I diastolic dysfunction (impaired relaxation). Elevated left ventricular end-diastolic pressure.  2. Right ventricular systolic function is normal. The right ventricular size is normal. There is mildly elevated pulmonary artery systolic pressure.  3. The mitral valve is normal in structure. No evidence of mitral valve regurgitation. No evidence of mitral stenosis.  4. The aortic valve is normal in structure. Aortic valve regurgitation is not visualized. No aortic stenosis is present.  5. The inferior vena cava is normal in size with greater than 50% respiratory variability, suggesting right atrial pressure of 3 mmHg. FINDINGS  Left Ventricle: Left ventricular ejection fraction, by estimation, is 60 to 65%. The left ventricle has normal function. The left ventricle has no regional wall motion abnormalities. The left ventricular internal cavity size was normal in size. There is  no left ventricular hypertrophy. Left  ventricular diastolic parameters are consistent with Grade I diastolic dysfunction (impaired relaxation). Elevated left ventricular end-diastolic pressure. Right Ventricle: The right ventricular size is normal. No increase in right ventricular wall thickness. Right ventricular systolic function is normal. There is mildly elevated pulmonary artery systolic pressure. The tricuspid regurgitant velocity is 3.04  m/s, and with an assumed right atrial pressure of 3 mmHg, the estimated right ventricular systolic pressure is 88.4 mmHg. Left Atrium: Left atrial size was normal in size. Right Atrium: Right atrial size was normal in size. Pericardium: There is no evidence of pericardial effusion. Mitral Valve: The mitral valve is normal in structure. Normal mobility of the mitral valve leaflets. No evidence of mitral valve regurgitation. No evidence of mitral valve stenosis. Tricuspid Valve: The tricuspid valve is normal in structure. Tricuspid valve regurgitation is mild . No evidence of tricuspid stenosis. Aortic Valve: The aortic valve is normal in structure. Aortic valve regurgitation is not visualized. No aortic stenosis is present. Pulmonic Valve: The pulmonic valve was normal in structure. Pulmonic valve regurgitation is not visualized. No evidence of pulmonic stenosis. Aorta: The aortic root is normal in size and structure. Venous: The inferior vena cava is normal in size with greater than 50% respiratory variability, suggesting right atrial pressure of 3 mmHg. IAS/Shunts: No atrial level shunt detected by color flow Doppler.  LEFT VENTRICLE PLAX 2D LVIDd:         3.80 cm  Diastology LVIDs:         2.30 cm  LV e' lateral:   7.29 cm/s LV PW:         0.80 cm  LV E/e' lateral: 9.5 LV IVS:        0.60 cm  LV e' medial:    4.24 cm/s LVOT diam:     1.80 cm  LV E/e' medial:  16.2 LV SV:         49 LV SV Index:   37 LVOT Area:     2.54 cm  RIGHT VENTRICLE RV S prime:     11.50 cm/s TAPSE (M-mode): 2.1 cm LEFT ATRIUM            Index      RIGHT ATRIUM          Index LA diam:      2.40 cm 1.78 cm/m RA Area:     8.16 cm LA Vol (A4C): 13.3 ml 9.86 ml/m RA Volume:  13.70 ml 10.16 ml/m  AORTIC VALVE LVOT Vmax:   104.00 cm/s LVOT Vmean:  73.900 cm/s LVOT VTI:    0.194 m  AORTA Ao Root diam: 2.80 cm MITRAL VALVE                TRICUSPID VALVE MV Area (PHT): 3.46 cm     TR Peak grad:   37.0 mmHg MV Decel Time: 219 msec     TR Vmax:        304.00 cm/s MV E velocity: 68.90 cm/s MV A velocity: 101.00 cm/s  SHUNTS MV E/A ratio:  0.68         Systemic VTI:  0.19 m                             Systemic Diam: 1.80 cm Skeet Latch MD Electronically signed by Skeet Latch MD Signature Date/Time: 08/19/2019/11:21:53 AM    Final    VAS US CAROTID  Result Date: 08/19/2019 Carotid Arterial Duplex Study Other Factors:     ESRD, on dialysis. Limitations        Today's exam was limited due to Dizziness. Orthostasis. Comparison Study:  No prior study on file for comparison. Performing Technologist: Sharion Dove RVS  Examination Guidelines: A complete evaluation includes B-mode imaging, spectral Doppler, color Doppler, and power Doppler as needed of all accessible portions of each vessel. Bilateral testing is considered an integral part of a complete examination. Limited examinations for reoccurring indications may be performed as noted.  Right Carotid Findings: +----------+--------+--------+--------+------------------+------------------+           PSV cm/sEDV cm/sStenosisPlaque DescriptionComments           +----------+--------+--------+--------+------------------+------------------+ CCA Prox  53      14                                intimal thickening +----------+--------+--------+--------+------------------+------------------+ CCA Distal65      21                                intimal thickening +----------+--------+--------+--------+------------------+------------------+ ICA Prox  68      21                                                    +----------+--------+--------+--------+------------------+------------------+ ICA Distal106     41                                                   +----------+--------+--------+--------+------------------+------------------+ ECA       95      12                                                   +----------+--------+--------+--------+------------------+------------------+ +----------+--------+-------+--------+-------------------+           PSV cm/sEDV cmsDescribeArm Pressure (mmHG) +----------+--------+-------+--------+-------------------+ [id number]                                         +----------+--------+-------+--------+-------------------+ +---------+--------+--+--------+--+  VertebralPSV cm/s49EDV cm/s13 +---------+--------+--+--------+--+  Left Carotid Findings: +----------+--------+--------+--------+------------------+------------------+           PSV cm/sEDV cm/sStenosisPlaque DescriptionComments           +----------+--------+--------+--------+------------------+------------------+ CCA Prox  55      18                                intimal thickening +----------+--------+--------+--------+------------------+------------------+ CCA Distal62      21                                intimal thickening +----------+--------+--------+--------+------------------+------------------+ ICA Prox  73      24                                                   +----------+--------+--------+--------+------------------+------------------+ ICA Distal87      31                                tortuous           +----------+--------+--------+--------+------------------+------------------+ ECA       62      7                                                    +----------+--------+--------+--------+------------------+------------------+ +----------+--------+--------+--------+-------------------+           PSV cm/sEDV  cm/sDescribeArm Pressure (mmHG) +----------+--------+--------+--------+-------------------+ [id number]                                          +----------+--------+--------+--------+-------------------+ +---------+--------+--+--------+--+ VertebralPSV cm/s27EDV cm/s10 +---------+--------+--+--------+--+   Summary: Right Carotid: The extracranial vessels were near-normal with only minimal wall                thickening or plaque. Left Carotid: The extracranial vessels were near-normal with only minimal wall               thickening or plaque. Vertebrals:  Bilateral vertebral arteries demonstrate antegrade flow. Subclavians: Normal flow hemodynamics were seen in bilateral subclavian              arteries. *See table(s) above for measurements and observations.  Electronically signed by Antony Contras MD on 08/19/2019 at 1:58:43 PM.    Final     ROS: As per HPI otherwise negative.   Physical Exam: Vitals:   08/19/19 0640 08/19/19 0645 08/19/19 0850 08/19/19 0922  BP: 114/76 98/73  98/65  Pulse: 89 95  75  Resp: 20 (!) 22  18  Temp:    98.1 F (36.7 C)  TempSrc:      SpO2: 100% 93% 94% 100%  Weight:      Height:         General: Frail appearing elderly lady in no acute distress. Head: Normocephalic, atraumatic, sclera non-icteric, mucus membranes are moist Neck: Supple. JVD not elevated. Lungs: Clear bilaterally to auscultation without wheezes, rales, or rhonchi. Breathing is unlabored. Heart: RRR with S1 S2.  No murmurs, rubs, or gallops appreciated. Abdomen: Soft, non-tender, non-distended with normoactive bowel sounds. No rebound/guarding. No obvious abdominal masses. M-S:  Strength and tone appear normal for age. Lower extremities:without edema or ischemic changes, no open wounds  Neuro: Alert and oriented X 3. Moves all extremities spontaneously. Psych:  Responds to questions appropriately with a normal affect. Dialysis Access: PD catheter with drsg CDI.   Dialysis  Orders: CCPD, 7X Week, EDW 45 (kg) Exchanges 4, fill vol 2000 mL, Dwell Time 1 hrs 30 min, last fill vol 0 mL, Day Exchanges 0, daytime fill vol 0 mL, Day Dwell Time 0 hrs 0 min. Supposed to be using all 1.5% solution  Assessment/Plan: 1.  Dizziness/hypotension: Part of this issue may be is that she should be using all 1.5% PD solutions. She has been using three 1.5% exchanges and one 2.5% exchanges. Will start on all 1.5% exchanges tonight. Not on any antihypertensive meds. Amlodipine 10 mg PO on OP med list but says she hasn't taken since earlier this year D/T hypotension. Has NOT been on torsemide-was DC'd earlier this year. Continue IVF @ 75CC/hr until 1600 today.  2. Hypokalemia: Continue K DUR 20 MEQ 2 tabs PO daily. Follow labs 3. Acute hypoxic respiratory failure-unclear etiology. No evidence of volume overload by CXR or exam. Per primary 4. ESRD -  Continue CCPD as noted above. Daily RFP. Daily weights.  5.  Hypertension/volume  - BP still soft here. No antihypertensive meds. Consider midodrine if hypotension does not improve with IVF, lower concentration dialysate.  6.  Anemia  - HGB 10.4. No need for ESA at present. Follow HGB.  7.  Metabolic bone disease -  Continue Auryxia, VDRA 8.  Nutrition -Albumin surprisingly good in the setting of recent unintentional wt loss. Nepro, renal vits.   Rita H. Owens Shark, NP-C 08/19/2019, 2:58 PM  [employer] [phone]   I have seen and examined this patient and agree with plan and assessment in the above note with renal recommendations/intervention highlighted.  Pt likely volume depleted because she did not have 1.5% Dialysate.  Feels better after getting some IVF"s but admits to orthostassis and dizziness ever since she contracted covid in February.   Cardiac workup underway.  ECHO with normal EF, grade 1 diastolic dysfunction and normal RV and valves.  Normal carotid duplex results and nothing to explain her symptoms. Governor Rooks  Helaine Yackel,MD 08/19/2019 4:39 PM

## 2019-08-19 NOTE — Plan of Care (Signed)
  Problem: Activity: Goal: Risk for activity intolerance will decrease Outcome: Progressing   Problem: Nutrition: Goal: Adequate nutrition will be maintained Outcome: Progressing   

## 2019-08-19 NOTE — Progress Notes (Signed)
PROGRESS NOTE   [name]  PQZ:[id number]    DOB: [birth year]    DOA: 08/18/2019  PCP: [facility] [place]   I have briefly reviewed patients previous medical records in [facility].  Chief Complaint:   Chief Complaint  Patient presents with  . Shortness of Breath    Brief Narrative:  74 year old, lives alone, independent, PMH of ESRD on CCPD for approximately last 1.5 years (follows with Dr. Joelyn Oms, Nephrology), HTN, HLD, CAD s/p CABG & chronic combined systolic and diastolic CHF (per renal notes), COPD, GERD, pulmonary nodules followed by pulmonology presented to ED due to postural dizziness, dyspnea and near syncope. She reports that since she had Covid in mid February, she has ongoing dizziness and dyspnea, prompting multiple ED visits, unable to move around much at home due to symptoms and hence unable to go out to buy food, decreased oral intake and approximately 10 pound weight loss over the last couple of months. Reportedly orthostatic in the ED. Admitted for further evaluation and management. Nephrology consulted.  Asse lives independent of daily activities, alone,ssment & Plan:  Principal Problem:   Orthostasis Active Problems:   ESRD (end stage renal disease) on dialysis ([place])   Hypokalemia   COPD (chronic obstructive pulmonary disease) (HCC)   Acute respiratory failure with hypoxia (HCC)   Hypotension/orthostatic hypotension/near syncope: Reportedly ongoing issue for the last 2 months. Orthostatic in the ED, heart rate increased by 45 bpm on standing. This morning however she was hypotensive with SBP's in the 90s and HR did go up from 75-100 on standing. Likely due to dehydration from poor oral intake and using wrong PD fluid. TTE: LVEF 60-65%. Grade 1 diastolic dysfunction. No aortic stenosis. Carotid ultrasound: Bilateral extracranial vessels were near normal with only minimal wall thickening or plaque. CT head without acute findings. Rebolused with normal  saline 500 mL x 1 followed by brief gentle IV fluids x12 hours. Monitor closely. PT evaluation  ESRD on CCPD: Nephrology consulted. Discussed with Dr. Marval Regal. Patient reportedly should be using all 1.5% PD solutions but has been using some 2.5% solutions. Management per nephrology.  Essential hypertension: Hypotensive here. Has stopped taking amlodipine and torsemide. Monitor with IV fluids.  Hypokalemia: Replaced.  Anemia of ESRD: Hemoglobin 10.4. Follow CBC.  Acute respiratory failure with hypoxia/COPD: Unclear etiology. D-dimer negative. Chest x-ray without acute findings but has emphysema and chronic changes oxygen saturation in the ED in the upper 80s to low 90s on room air while at rest. No acute findings on chest x-ray. D-dimer negative. No infectious signs and symptoms. Clinically dehydrated. TTE unremarkable. Reassess O2 needs prior to discharge.  Lung nodules: Outpatient pulmonology follow-up.  Elevated troponin: No anginal symptoms. HS troponin minimally elevated and flat trend. TTE with normal LVEF and no wall motion abnormalities. Continue aspirin and atorvastatin.  Body mass index is 16.09 kg/m./Severe malnutrition: Dietitian consulted.  COPD: No clinical bronchospasm. Chest x-ray without acute cardiopulmonary process. Shows postsurgical changes of left upper lobe and right midlung field scarring? Contributing to her hypoxia.   DVT prophylaxis: Subcutaneous heparin Code Status: Full Family Communication: None at bedside Disposition:  . Patient came from: Home           . Anticipated d/c place: Home . Barriers to d/c: Symptomatic hypotension/orthostatic hypotension secondary to intravascular volume depletion. Admitted under observation status, hopeful for improvement and discharge by 4/13 a.m. Thereby will leave under observation status.   Consultants:   Nephrology  Procedures:   Ongoing CCPD  Antimicrobials:   None   Subjective:  History as noted above.  Ongoing orthostatic dizziness and near syncope when attempting to stand up this morning with nursing tech while checking orthostatic vitals in room. No chest pain, palpitations or dyspnea reported. Poor oral intake.  Objective:   Vitals:   08/19/19 0645 08/19/19 0850 08/19/19 0922 08/19/19 1618  BP: 98/73  98/65 107/61  Pulse: 95  75 81  Resp: (!) 22  18 18   Temp:   98.1 F (36.7 C) 97.8 F (36.6 C)  TempSrc:    Oral  SpO2: 93% 94% 100% 95%  Weight:      Height:        General exam: Elderly female, small built, thinly nourished, frail and chronically ill looking lying comfortably propped up in bed. Oral mucosa dry. Respiratory system: Clear to auscultation. Respiratory effort normal. Cardiovascular system: S1 & S2 heard, RRR. No JVD, murmurs, rubs, gallops or clicks. No pedal edema. Telemetry personally reviewed: Sinus rhythm. Gastrointestinal system: Abdomen is nondistended, soft and nontender. No organomegaly or masses felt. Normal bowel sounds heard. PD catheter intact without acute findings. Central nervous system: Alert and oriented. No focal neurological deficits. Extremities: Symmetric 5 x 5 power. Skin: No rashes, lesions or ulcers Psychiatry: Judgement and insight appear normal. Mood & affect appropriate.     Data Reviewed:   I have personally reviewed following labs and imaging studies   CBC: Recent Labs  Lab 08/18/19 1514 08/19/19 0539  WBC 6.7 6.6  NEUTROABS 4.4  --   HGB 12.3 10.4*  HCT 36.6 31.2*  MCV 95.8 96.0  PLT 265 782    Basic Metabolic Panel: Recent Labs  Lab 08/18/19 1514 08/19/19 0539  NA 138 138  K 3.2* 3.8  CL 95* 98  CO2 27 23  GLUCOSE 96 86  BUN 42* 42*  CREATININE 10.54* 10.57*  CALCIUM 9.3 8.9  MG  --  1.9    Liver Function Tests: Recent Labs  Lab 08/18/19 1514  AST 30  ALT 26  ALKPHOS 67  BILITOT 0.8  PROT 7.4  ALBUMIN 3.7    CBG: No results for input(s): GLUCAP in the last 168 hours.  Microbiology Studies:    Recent Results (from the past 240 hour(s))  SARS CORONAVIRUS 2 (TAT 6-24 HRS) Nasopharyngeal Nasopharyngeal Swab     Status: None   Collection Time: 08/18/19  7:26 PM   Specimen: Nasopharyngeal Swab  Result Value Ref Range Status   SARS Coronavirus 2 NEGATIVE NEGATIVE Final    Comment: (NOTE) SARS-CoV-2 target nucleic acids are NOT DETECTED. The SARS-CoV-2 RNA is generally detectable in upper and lower respiratory specimens during the acute phase of infection. Negative results do not preclude SARS-CoV-2 infection, do not rule out co-infections with other pathogens, and should not be used as the sole basis for treatment or other patient management decisions. Negative results must be combined with clinical observations, patient history, and epidemiological information. The expected result is Negative. Fact Sheet for Patients: SugarRoll.be Fact Sheet for Healthcare Providers: https://www.woods-mathews.com/ This test is not yet approved or cleared by the [place] FDA and  has been authorized for detection and/or diagnosis of SARS-CoV-2 by FDA under an Emergency Use Authorization (EUA). This EUA will remain  in effect (meaning this test can be used) for the duration of the COVID-19 declaration under Section 56 4(b)(1) of the Act, 21 U.S.C. section 360bbb-3(b)(1), unless the authorization is terminated or revoked sooner. Performed at [facility], [place]  [address], [place], [place] [address]      Radiology Studies:  CT Head Wo Contrast  Result Date: 08/18/2019 CLINICAL DATA:  74 year old female with dizziness. EXAM: CT HEAD WITHOUT CONTRAST TECHNIQUE: Contiguous axial images were obtained from the base of the skull through the vertex without intravenous contrast. COMPARISON:  Head CT dated 12/02/2015. FINDINGS: Brain: Mild age-related atrophy and chronic microvascular ischemic changes. There is no acute intracranial hemorrhage. No  mass effect or midline shift. No extra-axial fluid collection. Vascular: No hyperdense vessel or unexpected calcification. Skull: Normal. Negative for fracture or focal lesion. Sinuses/Orbits: No acute finding. Other: None IMPRESSION: 1. No acute intracranial pathology. 2. Mild age-related atrophy and chronic microvascular ischemic changes. Electronically Signed   By: Anner Crete M.D.   On: 08/18/2019 19:28   ECHOCARDIOGRAM COMPLETE  Result Date: 08/19/2019    ECHOCARDIOGRAM REPORT   Patient Name:   [name] Date of Exam: 08/19/2019 Medical Rec #:  [id number]        Height:       62.0 in Accession #:    [id number]       Weight:       88.0 lb Date of Birth:  15-Mar-1946        BSA:          1.348 m Patient Age:    76 years         BP:           98/73 mmHg Patient Gender: F                HR:           95 bpm. Exam Location:  Inpatient Procedure: 2D Echo Indications:    dyspnea 786.09  History:        Patient has no prior history of Echocardiogram examinations.                 COPD; Risk Factors:Hypertension, Dyslipidemia and Former Smoker.  Sonographer:    Jannett Celestine RDCS (AE) Referring Phys: [id number] [place]  1. Left ventricular ejection fraction, by estimation, is 60 to 65%. The left ventricle has normal function. The left ventricle has no regional wall motion abnormalities. Left ventricular diastolic parameters are consistent with Grade I diastolic dysfunction (impaired relaxation). Elevated left ventricular end-diastolic pressure.  2. Right ventricular systolic function is normal. The right ventricular size is normal. There is mildly elevated pulmonary artery systolic pressure.  3. The mitral valve is normal in structure. No evidence of mitral valve regurgitation. No evidence of mitral stenosis.  4. The aortic valve is normal in structure. Aortic valve regurgitation is not visualized. No aortic stenosis is present.  5. The inferior vena cava is normal in size with greater than  50% respiratory variability, suggesting right atrial pressure of 3 mmHg. FINDINGS  Left Ventricle: Left ventricular ejection fraction, by estimation, is 60 to 65%. The left ventricle has normal function. The left ventricle has no regional wall motion abnormalities. The left ventricular internal cavity size was normal in size. There is  no left ventricular hypertrophy. Left ventricular diastolic parameters are consistent with Grade I diastolic dysfunction (impaired relaxation). Elevated left ventricular end-diastolic pressure. Right Ventricle: The right ventricular size is normal. No increase in right ventricular wall thickness. Right ventricular systolic function is normal. There is mildly elevated pulmonary artery systolic pressure. The tricuspid regurgitant velocity is 3.04  m/s, and with an assumed right atrial pressure of 3 mmHg, the estimated right ventricular  systolic pressure is 29.5 mmHg. Left Atrium: Left atrial size was normal in size. Right Atrium: Right atrial size was normal in size. Pericardium: There is no evidence of pericardial effusion. Mitral Valve: The mitral valve is normal in structure. Normal mobility of the mitral valve leaflets. No evidence of mitral valve regurgitation. No evidence of mitral valve stenosis. Tricuspid Valve: The tricuspid valve is normal in structure. Tricuspid valve regurgitation is mild . No evidence of tricuspid stenosis. Aortic Valve: The aortic valve is normal in structure. Aortic valve regurgitation is not visualized. No aortic stenosis is present. Pulmonic Valve: The pulmonic valve was normal in structure. Pulmonic valve regurgitation is not visualized. No evidence of pulmonic stenosis. Aorta: The aortic root is normal in size and structure. Venous: The inferior vena cava is normal in size with greater than 50% respiratory variability, suggesting right atrial pressure of 3 mmHg. IAS/Shunts: No atrial level shunt detected by color flow Doppler.  LEFT VENTRICLE PLAX 2D  LVIDd:         3.80 cm  Diastology LVIDs:         2.30 cm  LV e' lateral:   7.29 cm/s LV PW:         0.80 cm  LV E/e' lateral: 9.5 LV IVS:        0.60 cm  LV e' medial:    4.24 cm/s LVOT diam:     1.80 cm  LV E/e' medial:  16.2 LV SV:         49 LV SV Index:   37 LVOT Area:     2.54 cm  RIGHT VENTRICLE RV S prime:     11.50 cm/s TAPSE (M-mode): 2.1 cm LEFT ATRIUM           Index      RIGHT ATRIUM          Index LA diam:      2.40 cm 1.78 cm/m RA Area:     8.16 cm LA Vol (A4C): 13.3 ml 9.86 ml/m RA Volume:   13.70 ml 10.16 ml/m  AORTIC VALVE LVOT Vmax:   104.00 cm/s LVOT Vmean:  73.900 cm/s LVOT VTI:    0.194 m  AORTA Ao Root diam: 2.80 cm MITRAL VALVE                TRICUSPID VALVE MV Area (PHT): 3.46 cm     TR Peak grad:   37.0 mmHg MV Decel Time: 219 msec     TR Vmax:        304.00 cm/s MV E velocity: 68.90 cm/s MV A velocity: 101.00 cm/s  SHUNTS MV E/A ratio:  0.68         Systemic VTI:  0.19 m                             Systemic Diam: 1.80 cm Skeet Latch MD Electronically signed by Skeet Latch MD Signature Date/Time: 08/19/2019/11:21:53 AM    Final    VAS US CAROTID  Result Date: 08/19/2019 Carotid Arterial Duplex Study Other Factors:     ESRD, on dialysis. Limitations        Today's exam was limited due to Dizziness. Orthostasis. Comparison Study:  No prior study on file for comparison. Performing Technologist: Sharion Dove RVS  Examination Guidelines: A complete evaluation includes B-mode imaging, spectral Doppler, color Doppler, and power Doppler as needed of all accessible portions of each vessel. Bilateral testing is considered  an integral part of a complete examination. Limited examinations for reoccurring indications may be performed as noted.  Right Carotid Findings: +----------+--------+--------+--------+------------------+------------------+           PSV cm/sEDV cm/sStenosisPlaque DescriptionComments            +----------+--------+--------+--------+------------------+------------------+ CCA Prox  53      14                                intimal thickening +----------+--------+--------+--------+------------------+------------------+ CCA Distal65      21                                intimal thickening +----------+--------+--------+--------+------------------+------------------+ ICA Prox  68      21                                                   +----------+--------+--------+--------+------------------+------------------+ ICA Distal106     41                                                   +----------+--------+--------+--------+------------------+------------------+ ECA       95      12                                                   +----------+--------+--------+--------+------------------+------------------+ +----------+--------+-------+--------+-------------------+           PSV cm/sEDV cmsDescribeArm Pressure (mmHG) +----------+--------+-------+--------+-------------------+ [id number]                                         +----------+--------+-------+--------+-------------------+ +---------+--------+--+--------+--+ VertebralPSV cm/s49EDV cm/s13 +---------+--------+--+--------+--+  Left Carotid Findings: +----------+--------+--------+--------+------------------+------------------+           PSV cm/sEDV cm/sStenosisPlaque DescriptionComments           +----------+--------+--------+--------+------------------+------------------+ CCA Prox  55      18                                intimal thickening +----------+--------+--------+--------+------------------+------------------+ CCA Distal62      21                                intimal thickening +----------+--------+--------+--------+------------------+------------------+ ICA Prox  73      24                                                    +----------+--------+--------+--------+------------------+------------------+ ICA Distal87      31                                tortuous           +----------+--------+--------+--------+------------------+------------------+  ECA       62      7                                                    +----------+--------+--------+--------+------------------+------------------+ +----------+--------+--------+--------+-------------------+           PSV cm/sEDV cm/sDescribeArm Pressure (mmHG) +----------+--------+--------+--------+-------------------+ [id number]                                          +----------+--------+--------+--------+-------------------+ +---------+--------+--+--------+--+ VertebralPSV cm/s27EDV cm/s10 +---------+--------+--+--------+--+   Summary: Right Carotid: The extracranial vessels were near-normal with only minimal wall                thickening or plaque. Left Carotid: The extracranial vessels were near-normal with only minimal wall               thickening or plaque. Vertebrals:  Bilateral vertebral arteries demonstrate antegrade flow. Subclavians: Normal flow hemodynamics were seen in bilateral subclavian              arteries. *See table(s) above for measurements and observations.  Electronically signed by Antony Contras MD on 08/19/2019 at 1:58:43 PM.    Final      Scheduled Meds:   . aspirin EC  81 mg Oral Daily  . atorvastatin  10 mg Oral q1800  . calcitRIOL  0.25 mcg Oral Daily  . fluticasone furoate-vilanterol  1 puff Inhalation Daily  . gentamicin cream  1 application Topical Daily  . heparin  5,000 Units Subcutaneous Q8H  . multivitamin  1 tablet Oral QHS  . pantoprazole  40 mg Oral Daily  . [START ON 08/20/2019] potassium chloride  40 mEq Oral Daily  . sodium chloride flush  3 mL Intravenous Q12H  . sodium chloride flush  3 mL Intravenous Q12H    Continuous Infusions:   . sodium chloride    . dialysis solution 1.5% low-MG/low-CA        LOS: 0 days     Vernell Leep, MD, [place], [facility]. [facility]    To contact the attending provider between 7A-7P or the covering provider during after hours 7P-7A, please log into the web site [email or web address] and access using universal  password for that web site. If you do not have the password, please call the hospital operator.  08/19/2019, 6:28 PM

## 2019-08-19 NOTE — Progress Notes (Signed)
  Echocardiogram 2D Echocardiogram has been performed.  [name] 08/19/2019, 8:52 AM

## 2019-08-19 NOTE — TOC Initial Note (Signed)
Transition of Care [facility]) - Initial/Assessment Note    Patient Details  Name: [name] MRN: [id number] Date of Birth: Mar 29, 1946  Transition of [facility]) CM/SW Contact:    Bartholomew Crews, RN Phone Number: [phone] 08/19/2019, 3:37 PM  Clinical Narrative:                  Spoke with patient at the bedside. PTA patient living alone. States that her daughter and other family members check in on her to offer support. PCP in Epic verfied. Pharmacy in [place] verified, but patient states that she gets her daily medications from [facility] mail order at no copay.   Patient expressed concerns about needing a home care worker. Does not have medicaid - discussed private pay options vs applying for Medicaid. Requested PT consult from MD for recommendations for home.   Patient stated that she will be in the hospital overnight for her PD to see how she does.   TOC team following for transition needs.   Expected Discharge Plan: [place] Barriers to Discharge: Continued Medical Work up   Patient Goals and CMS Choice Patient states their goals for this hospitalization and ongoing recovery are:: to feel better, less dizzy CMS Medicare.gov Compare Post Acute Care list provided to:: Patient Choice offered to / list presented to : Patient  Expected Discharge Plan and Services Expected Discharge Plan: [place] In-house Referral: Clinical Social Work Discharge Planning Services: CM Consult Post Acute Care Choice: [place] arrangements for the past 2 months: Single Family Home                                      Prior Living Arrangements/Services Living arrangements for the past 2 months: Single Family Home Lives with:: Self Patient language and need for interpreter reviewed:: Yes Do you feel safe going back to the place where you live?: Yes      Need for Family Participation in Patient Care: Yes (Comment) Care giver support system in  place?: Yes (comment) Current home services: DME(PD cycler) Criminal Activity/Legal Involvement Pertinent to Current Situation/Hospitalization: No - Comment as needed  Activities of Daily Living Home Assistive Devices/Equipment: Blood pressure cuff, Dentures (specify type), Scales, Other (Comment)(Had Peritoneal Dialysis Cycler at home) ADL Screening (condition at time of admission) Patient's cognitive ability adequate to safely complete daily activities?: Yes Is the patient deaf or have difficulty hearing?: No Does the patient have difficulty seeing, even when wearing glasses/contacts?: No Does the patient have difficulty concentrating, remembering, or making decisions?: No Patient able to express need for assistance with ADLs?: Yes Does the patient have difficulty dressing or bathing?: No Independently performs ADLs?: Yes (appropriate for developmental age) Does the patient have difficulty walking or climbing stairs?: No Weakness of Legs: None Weakness of Arms/Hands: None  Permission Sought/Granted                  Emotional Assessment Appearance:: Appears stated age Attitude/Demeanor/Rapport: Engaged Affect (typically observed): Accepting Orientation: : Oriented to Self, Oriented to  Time, Oriented to Place, Oriented to Situation Alcohol / Substance Use: Not Applicable Psych Involvement: No (comment)  Admission diagnosis:  Orthostasis [I95.1] Patient Active Problem List   Diagnosis Date Noted  . Orthostasis 08/18/2019  . Protein-calorie malnutrition, severe 02/06/2018  . Peritonitis ([place]) 02/05/2018  . Hypokalemia 02/05/2018  . Pulmonary nodules   . ESRD (  end stage renal disease) on dialysis ([place])   . 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 Hyperphosphatemia 07/25/2016  . COPD (chronic obstructive pulmonary disease) ([place]) 05/23/2012  . Acute respiratory failure with hypoxia ([place]) 05/23/2012  .  Essential hypertension 12/25/2007   PCP:  [facility] [place]:   [facility] [place], [address] [place] [address] Phone: [phone] Fax: [phone]  [facility] [place], [address] [place] [address] Phone: [phone] Fax: [phone]  [id number] - [place], [place] - [address] [place] [address] Phone: [phone] Fax: [phone]     Social Determinants of Health (SDOH) Interventions    Readmission Risk Interventions No flowsheet data found.

## 2019-08-19 NOTE — Progress Notes (Signed)
VASCULAR LAB PRELIMINARY  PRELIMINARY  PRELIMINARY  PRELIMINARY  Carotid duplex completed.    Preliminary report:  See CV proc for preliminary results.  Homer Miller, RVT 08/19/2019, 11:21 AM

## 2019-08-19 NOTE — Care Management Obs Status (Signed)
[place] NOTIFICATION   Patient Details  Name: [name] MRN: [id number] Date of Birth: 1945/05/12   Medicare Observation Status Notification Given:  Yes    Bartholomew Crews, RN 08/19/2019, 3:22 PM

## 2019-08-20 DIAGNOSIS — Z87891 Personal history of nicotine dependence: Secondary | ICD-10-CM | POA: Diagnosis not present

## 2019-08-20 DIAGNOSIS — Z8616 Personal history of COVID-19: Secondary | ICD-10-CM | POA: Diagnosis not present

## 2019-08-20 DIAGNOSIS — E785 Hyperlipidemia, unspecified: Secondary | ICD-10-CM | POA: Diagnosis present

## 2019-08-20 DIAGNOSIS — I951 Orthostatic hypotension: Secondary | ICD-10-CM | POA: Diagnosis present

## 2019-08-20 DIAGNOSIS — Z808 Family history of malignant neoplasm of other organs or systems: Secondary | ICD-10-CM | POA: Diagnosis not present

## 2019-08-20 DIAGNOSIS — E876 Hypokalemia: Secondary | ICD-10-CM | POA: Diagnosis present

## 2019-08-20 DIAGNOSIS — R55 Syncope and collapse: Secondary | ICD-10-CM

## 2019-08-20 DIAGNOSIS — Z951 Presence of aortocoronary bypass graft: Secondary | ICD-10-CM | POA: Diagnosis not present

## 2019-08-20 DIAGNOSIS — E860 Dehydration: Secondary | ICD-10-CM | POA: Diagnosis present

## 2019-08-20 DIAGNOSIS — N186 End stage renal disease: Secondary | ICD-10-CM | POA: Diagnosis present

## 2019-08-20 DIAGNOSIS — J9601 Acute respiratory failure with hypoxia: Secondary | ICD-10-CM | POA: Diagnosis present

## 2019-08-20 DIAGNOSIS — K922 Gastrointestinal hemorrhage, unspecified: Secondary | ICD-10-CM | POA: Diagnosis present

## 2019-08-20 DIAGNOSIS — Z992 Dependence on renal dialysis: Secondary | ICD-10-CM | POA: Diagnosis not present

## 2019-08-20 DIAGNOSIS — N2581 Secondary hyperparathyroidism of renal origin: Secondary | ICD-10-CM | POA: Diagnosis present

## 2019-08-20 DIAGNOSIS — Z803 Family history of malignant neoplasm of breast: Secondary | ICD-10-CM | POA: Diagnosis not present

## 2019-08-20 DIAGNOSIS — I5042 Chronic combined systolic (congestive) and diastolic (congestive) heart failure: Secondary | ICD-10-CM | POA: Diagnosis present

## 2019-08-20 DIAGNOSIS — Z681 Body mass index (BMI) 19 or less, adult: Secondary | ICD-10-CM | POA: Diagnosis not present

## 2019-08-20 DIAGNOSIS — I132 Hypertensive heart and chronic kidney disease with heart failure and with stage 5 chronic kidney disease, or end stage renal disease: Secondary | ICD-10-CM | POA: Diagnosis present

## 2019-08-20 DIAGNOSIS — J449 Chronic obstructive pulmonary disease, unspecified: Secondary | ICD-10-CM | POA: Diagnosis present

## 2019-08-20 DIAGNOSIS — E43 Unspecified severe protein-calorie malnutrition: Secondary | ICD-10-CM | POA: Diagnosis present

## 2019-08-20 DIAGNOSIS — E861 Hypovolemia: Secondary | ICD-10-CM | POA: Diagnosis present

## 2019-08-20 DIAGNOSIS — R42 Dizziness and giddiness: Secondary | ICD-10-CM

## 2019-08-20 DIAGNOSIS — Z7982 Long term (current) use of aspirin: Secondary | ICD-10-CM | POA: Diagnosis not present

## 2019-08-20 DIAGNOSIS — M8580 Other specified disorders of bone density and structure, unspecified site: Secondary | ICD-10-CM | POA: Diagnosis present

## 2019-08-20 DIAGNOSIS — I2510 Atherosclerotic heart disease of native coronary artery without angina pectoris: Secondary | ICD-10-CM | POA: Diagnosis present

## 2019-08-20 DIAGNOSIS — D631 Anemia in chronic kidney disease: Secondary | ICD-10-CM | POA: Diagnosis present

## 2019-08-20 LAB — CBC
HCT: 27.9 % — ABNORMAL LOW (ref 36.0–46.0)
Hemoglobin: 9.1 g/dL — ABNORMAL LOW (ref 12.0–15.0)
MCH: 31.6 pg (ref 26.0–34.0)
MCHC: 32.6 g/dL (ref 30.0–36.0)
MCV: 96.9 fL (ref 80.0–100.0)
Platelets: 208 10*3/uL (ref 150–400)
RBC: 2.88 MIL/uL — ABNORMAL LOW (ref 3.87–5.11)
RDW: 13.9 % (ref 11.5–15.5)
WBC: 5.5 10*3/uL (ref 4.0–10.5)
nRBC: 0 % (ref 0.0–0.2)

## 2019-08-20 LAB — RENAL FUNCTION PANEL
Albumin: 2.5 g/dL — ABNORMAL LOW (ref 3.5–5.0)
Anion gap: 10 (ref 5–15)
BUN: 37 mg/dL — ABNORMAL HIGH (ref 8–23)
CO2: 24 mmol/L (ref 22–32)
Calcium: 8.3 mg/dL — ABNORMAL LOW (ref 8.9–10.3)
Chloride: 102 mmol/L (ref 98–111)
Creatinine, Ser: 8.56 mg/dL — ABNORMAL HIGH (ref 0.44–1.00)
GFR calc Af Amer: 5 mL/min — ABNORMAL LOW (ref >60)
GFR calc non Af Amer: 4 mL/min — ABNORMAL LOW (ref >60)
Glucose, Bld: 95 mg/dL (ref 70–99)
Phosphorus: 4.6 mg/dL (ref 2.5–4.6)
Potassium: 3.6 mmol/L (ref 3.5–5.1)
Sodium: 136 mmol/L (ref 135–145)

## 2019-08-20 MED ORDER — ALUM & MAG HYDROXIDE-SIMETH 200-200-20 MG/5ML PO SUSP
15.0000 mL | ORAL | Status: DC | PRN
  Administered 2019-08-20: 15 mL via ORAL
  Filled 2019-08-20: qty 30

## 2019-08-20 NOTE — Evaluation (Signed)
Physical Therapy Evaluation Patient Details Name: [name] MRN: [id number] DOB: [birth year] Today's Date: 08/20/2019   History of Present Illness  Patient is a 74 year old female admitted with orthostasis. PMH includes: COPD, GERD, HLD, HTN, ESRD on HD  Clinical Impression  Patient received in bathroom independently. Reports she is feeling better, but continues to feel weak. Reports 10 pound weight loss recently due to no appetite. Patient ambulated 125 feet without AD at supervision level. Educated to transition slowly to prevent falls and to let BP level out with transitions. Patient verbally agrees. Patient will continue to benefit from skilled PT while here to improve activity tolerance and strength for safe return home.      Follow Up Recommendations Home health PT    Equipment Recommendations  None recommended by PT    Recommendations for Other Services       Precautions / Restrictions Precautions Precaution Comments: mod fall Restrictions Weight Bearing Restrictions: No      Mobility  Bed Mobility Overal bed mobility: Independent             General bed mobility comments: patient received in bathroom independently upon arrival  Transfers Overall transfer level: Independent               General transfer comment: received in bathroom  Ambulation/Gait Ambulation/Gait assistance: Supervision Gait Distance (Feet): 125 Feet   Gait Pattern/deviations: WFL(Within Functional Limits) Gait velocity: WFL   General Gait Details: patient without LOB, no dizziness reported, reports weakness  Stairs            Wheelchair Mobility    Modified Rankin (Stroke Patients Only)       Balance Overall balance assessment: Mild deficits observed, not formally tested                                           Pertinent Vitals/Pain Pain Assessment: No/denies pain    Home Living Family/patient expects to be discharged to:: Private  residence Living Arrangements: Alone Available Help at Discharge: Family;Friend(s);Available PRN/intermittently Type of Home: House Home Access: Level entry     Home Layout: One level Home Equipment: Walker - 2 wheels      Prior Function Level of Independence: Independent         Comments: patient was independent at home, does not drive so gets ride to store.     Hand Dominance        Extremity/Trunk Assessment   Upper Extremity Assessment Upper Extremity Assessment: Generalized weakness    Lower Extremity Assessment Lower Extremity Assessment: Generalized weakness    Cervical / Trunk Assessment Cervical / Trunk Assessment: Normal  Communication   Communication: No difficulties  Cognition Arousal/Alertness: Awake/alert Behavior During Therapy: WFL for tasks assessed/performed Overall Cognitive Status: Within Functional Limits for tasks assessed                                        General Comments      Exercises     Assessment/Plan    PT Assessment Patient needs continued PT services  PT Problem List Decreased strength;Decreased activity tolerance;Decreased mobility       PT Treatment Interventions Therapeutic exercise;Gait training;Functional mobility training;Therapeutic activities;Patient/family education    PT Goals (Current goals can be found in the Care  Plan section)  Acute Rehab PT Goals Patient Stated Goal: to return home, get stronger PT Goal Formulation: With patient Time For Goal Achievement: 09/03/19 Potential to Achieve Goals: Good    Frequency Min 3X/week   Barriers to discharge Decreased caregiver support      Co-evaluation               AM-PAC PT "6 Clicks" Mobility  Outcome Measure Help needed turning from your back to your side while in a flat bed without using bedrails?: None Help needed moving from lying on your back to sitting on the side of a flat bed without using bedrails?: None Help needed  moving to and from a bed to a chair (including a wheelchair)?: None Help needed standing up from a chair using your arms (e.g., wheelchair or bedside chair)?: None Help needed to walk in hospital room?: A Little Help needed climbing 3-5 steps with a railing? : A Little 6 Click Score: 22    End of Session Equipment Utilized During Treatment: Gait belt Activity Tolerance: Patient tolerated treatment well Patient left: in bed;with call bell/phone within reach Nurse Communication: Mobility status PT Visit Diagnosis: Muscle weakness (generalized) (M62.81)    Time: 6283-6629 PT Time Calculation (min) (ACUTE ONLY): 10 min   Charges:   PT Evaluation $PT Eval Low Complexity: 1 Low          Jaquarius Seder, PT, GCS 08/20/19,10:31 AM

## 2019-08-20 NOTE — TOC Progression Note (Addendum)
Transition of Care [facility]) - Progression Note    Patient Details  Name: [name] MRN: [id number] Date of Birth: November 29, 1945  Transition of [facility]) CM/SW Contact  Bartholomew Crews, RN Phone Number: [phone] 08/20/2019, 1:30 PM  Clinical Narrative:     Spoke with patient at the bedside. Discussed HH recommendations for PT. Patient asking about an aide for at least a week or so. Provided [facility] agency choice list from [email or web address] Patient states that she has had [facility] in the past and would like them again. Referral pending with [facility].   Update: HH referral accepted by [facility] for PT, aide - patient will need [place] orders for PT, Aide with [facility] at discharge.   TOC team following for transition needs.   Expected Discharge Plan: [place] Barriers to Discharge: Continued Medical Work up  Expected Discharge Plan and Services Expected Discharge Plan: [place] In-house Referral: Clinical Social Work Discharge Planning Services: CM Consult Post Acute Care Choice: [place] arrangements for the past 2 months: Single Family Home                                       Social Determinants of Health (SDOH) Interventions    Readmission Risk Interventions No flowsheet data found.

## 2019-08-20 NOTE — Progress Notes (Signed)
PROGRESS NOTE    [name]  BJY:[id number] DOB: [birth year] DOA: 08/18/2019 [facility]: [facility]   Brief Narrative:  74 year old lives independently with history of ESRD on peritoneal dialysis, HTN, HLD, CAD status post CABG, CHF, pulmonary nodules, GERD, COPD admitted for dizziness and presyncope.  Upon admission she was noted to be orthostatic this was thought to be secondary to use of wrong dialysis late during her dialysis sessions.   Assessment & Plan:   Principal Problem:   Orthostasis Active Problems:   ESRD (end stage renal disease) on dialysis (HCC)   Hypokalemia   COPD (chronic obstructive pulmonary disease) (HCC)   Acute respiratory failure with hypoxia (HCC)  Orthostatic hypotension/presyncope -Echocardiogram showed EF of 60 to 08%, grade 1 diastolic dysfunction.  CT of the head negative -Suspect secondary to dialysis issues using the wrong bag of solution.  Nephrology is following. -Gentle hydration as necessary.  Caution as she is a dialysis patient.  ESRD on hemodialysis -Nephrology team following.  Their team will educate the patient regarding what is the correct way of dialyzing herself-she is on peritoneal dialysis  Essential hypertension -Medications on hold for now  Anemia of chronic disease -Hemoglobin stable around 10  History of COPD -Bronchodilators as needed.    DVT prophylaxis: Subcutaneous heparin Code Status: Full code Family Communication: None at bedside Disposition Plan:   Patient From= home  Patient Anticipated D/C place= Home with home health  Barriers= she still feels quite weak and gets tachycardic with ambulation.  Unsafe for discharge today, hopefully we can discharge her tomorrow.  She does not feel back to her baseline, lives at home alone.  Subjective: Patient tells me that this morning when she attempted to walk her dizziness was improved but felt like her heart rate was racing and felt weak overall.  Does not feel  back to her baseline.  Review of Systems Otherwise negative except as per HPI, including: General: Denies fever, chills, night sweats or unintended weight loss. Resp: Denies cough, wheezing, shortness of breath. Cardiac: Denies chest pain, palpitations, orthopnea, paroxysmal nocturnal dyspnea. GI: Denies abdominal pain, nausea, vomiting, diarrhea or constipation GU: Denies dysuria, frequency, hesitancy or incontinence MS: Denies muscle aches, joint pain or swelling Neuro: Denies headache, neurologic deficits (focal weakness, numbness, tingling), abnormal gait Psych: Denies anxiety, depression, SI/HI/AVH Skin: Denies new rashes or lesions ID: Denies sick contacts, exotic exposures, travel  Examination:  General exam: Appears calm and comfortable, bilateral temporal wasting. Appears generalized weak.  Respiratory system: Clear to auscultation. Respiratory effort normal. Cardiovascular system: S1 & S2 heard, RRR. No JVD, murmurs, rubs, gallops or clicks. No pedal edema. Gastrointestinal system: Abdomen is nondistended, soft and nontender. No organomegaly or masses felt. Normal bowel sounds heard. Central nervous system: Alert and oriented. No focal neurological deficits. Extremities: Symmetric 5 x 5 power. Skin: No rashes, lesions or ulcers Psychiatry: Judgement and insight appear normal. Mood & affect appropriate.     Objective: Vitals:   08/20/19 0916 08/20/19 0919 08/20/19 1024 08/20/19 1245  BP: 93/60 101/65  103/60  Pulse: 85 85  72  Resp:    18  Temp:    98.5 F (36.9 C)  TempSrc:    Oral  SpO2: 100% 100%  100%  Weight:   41 kg   Height:        Intake/Output Summary (Last 24 hours) at 08/20/2019 1328 Last data filed at 08/20/2019 0900 Gross per 24 hour  Intake 1030 ml  Output 60 ml  Net  970 ml   Filed Weights   08/18/19 2115 08/20/19 0723 08/20/19 1024  Weight: 39.9 kg 40.3 kg 41 kg     Data Reviewed:   CBC: Recent Labs  Lab 08/18/19 1514 08/19/19 0539  08/20/19 0517  WBC 6.7 6.6 5.5  NEUTROABS 4.4  --   --   HGB 12.3 10.4* 9.1*  HCT 36.6 31.2* 27.9*  MCV 95.8 96.0 96.9  PLT 265 255 696   Basic Metabolic Panel: Recent Labs  Lab 08/18/19 1514 08/19/19 0539 08/20/19 0517  NA 138 138 136  K 3.2* 3.8 3.6  CL 95* 98 102  CO2 27 23 24   GLUCOSE 96 86 95  BUN 42* 42* 37*  CREATININE 10.54* 10.57* 8.56*  CALCIUM 9.3 8.9 8.3*  MG  --  1.9  --   PHOS  --   --  4.6   GFR: Estimated Creatinine Clearance: 3.7 mL/min (A) (by C-G formula based on SCr of 8.56 mg/dL (H)). Liver Function Tests: Recent Labs  Lab 08/18/19 1514 08/20/19 0517  AST 30  --   ALT 26  --   ALKPHOS 67  --   BILITOT 0.8  --   PROT 7.4  --   ALBUMIN 3.7 2.5*   No results for input(s): LIPASE, AMYLASE in the last 168 hours. No results for input(s): AMMONIA in the last 168 hours. Coagulation Profile: No results for input(s): INR, PROTIME in the last 168 hours. Cardiac Enzymes: No results for input(s): CKTOTAL, CKMB, CKMBINDEX, TROPONINI in the last 168 hours. BNP (last 3 results) No results for input(s): PROBNP in the last 8760 hours. HbA1C: No results for input(s): HGBA1C in the last 72 hours. CBG: No results for input(s): GLUCAP in the last 168 hours. Lipid Profile: No results for input(s): CHOL, HDL, LDLCALC, TRIG, CHOLHDL, LDLDIRECT in the last 72 hours. Thyroid Function Tests: No results for input(s): TSH, T4TOTAL, FREET4, T3FREE, THYROIDAB in the last 72 hours. Anemia Panel: No results for input(s): VITAMINB12, FOLATE, FERRITIN, TIBC, IRON, RETICCTPCT in the last 72 hours. Sepsis Labs: No results for input(s): PROCALCITON, LATICACIDVEN in the last 168 hours.  Recent Results (from the past 240 hour(s))  SARS CORONAVIRUS 2 (TAT 6-24 HRS) Nasopharyngeal Nasopharyngeal Swab     Status: None   Collection Time: 08/18/19  7:26 PM   Specimen: Nasopharyngeal Swab  Result Value Ref Range Status   SARS Coronavirus 2 NEGATIVE NEGATIVE Final    Comment:  (NOTE) SARS-CoV-2 target nucleic acids are NOT DETECTED. The SARS-CoV-2 RNA is generally detectable in upper and lower respiratory specimens during the acute phase of infection. Negative results do not preclude SARS-CoV-2 infection, do not rule out co-infections with other pathogens, and should not be used as the sole basis for treatment or other patient management decisions. Negative results must be combined with clinical observations, patient history, and epidemiological information. The expected result is Negative. Fact Sheet for Patients: SugarRoll.be Fact Sheet for Healthcare Providers: https://www.woods-mathews.com/ This test is not yet approved or cleared by the [place] FDA and  has been authorized for detection and/or diagnosis of SARS-CoV-2 by FDA under an Emergency Use Authorization (EUA). This EUA will remain  in effect (meaning this test can be used) for the duration of the COVID-19 declaration under Section 56 4(b)(1) of the Act, 21 U.S.C. section 360bbb-3(b)(1), unless the authorization is terminated or revoked sooner. Performed at [facility], [place] [address] [place], [place] [address]          Radiology  Studies: DG Chest 2 View  Result Date: 08/18/2019 CLINICAL DATA:  74 year old female with shortness of breath. EXAM: CHEST - 2 VIEW COMPARISON:  Chest radiograph dated 07/02/2018 and CT dated 07/03/2019 FINDINGS: There is background of emphysema. Postsurgical changes of left upper lobe and interstitial coarsening of the right mid lung field similar to prior radiograph. No new consolidation. There is no pleural effusion or pneumothorax. Stable cardiac silhouette. Atherosclerotic calcification of the aorta. No acute osseous pathology. Degenerative changes of the spine. Two ovoid radiopaque structures in the region of the gastroesophageal junction each measuring approximately 14 mm likely represent ingested pills in the  distal esophagus or gastroesophageal junction. No acute osseous pathology. IMPRESSION: 1. No acute cardiopulmonary process. 2. Emphysema. 3. Postsurgical changes of left upper lobe and right mid lung field scarring. 4. Probable two pills in the region of the gastroesophageal junction. Electronically Signed   By: Anner Crete M.D.   On: 08/18/2019 15:06   CT Head Wo Contrast  Result Date: 08/18/2019 CLINICAL DATA:  74 year old female with dizziness. EXAM: CT HEAD WITHOUT CONTRAST TECHNIQUE: Contiguous axial images were obtained from the base of the skull through the vertex without intravenous contrast. COMPARISON:  Head CT dated 12/02/2015. FINDINGS: Brain: Mild age-related atrophy and chronic microvascular ischemic changes. There is no acute intracranial hemorrhage. No mass effect or midline shift. No extra-axial fluid collection. Vascular: No hyperdense vessel or unexpected calcification. Skull: Normal. Negative for fracture or focal lesion. Sinuses/Orbits: No acute finding. Other: None IMPRESSION: 1. No acute intracranial pathology. 2. Mild age-related atrophy and chronic microvascular ischemic changes. Electronically Signed   By: Anner Crete M.D.   On: 08/18/2019 19:28   ECHOCARDIOGRAM COMPLETE  Result Date: 08/19/2019    ECHOCARDIOGRAM REPORT   Patient Name:   [name] Date of Exam: 08/19/2019 Medical Rec #:  [id number]        Height:       62.0 in Accession #:    [id number]       Weight:       88.0 lb Date of Birth:  Oct 16, 1945        BSA:          1.348 m Patient Age:    35 years         BP:           98/73 mmHg Patient Gender: F                HR:           95 bpm. Exam Location:  Inpatient Procedure: 2D Echo Indications:    dyspnea 786.09  History:        Patient has no prior history of Echocardiogram examinations.                 COPD; Risk Factors:Hypertension, Dyslipidemia and Former Smoker.  Sonographer:    Jannett Celestine RDCS (AE) Referring Phys: [id number] [place]   1. Left ventricular ejection fraction, by estimation, is 60 to 65%. The left ventricle has normal function. The left ventricle has no regional wall motion abnormalities. Left ventricular diastolic parameters are consistent with Grade I diastolic dysfunction (impaired relaxation). Elevated left ventricular end-diastolic pressure.  2. Right ventricular systolic function is normal. The right ventricular size is normal. There is mildly elevated pulmonary artery systolic pressure.  3. The mitral valve is normal in structure. No evidence of mitral valve regurgitation. No evidence of mitral stenosis.  4. The aortic valve is normal  in structure. Aortic valve regurgitation is not visualized. No aortic stenosis is present.  5. The inferior vena cava is normal in size with greater than 50% respiratory variability, suggesting right atrial pressure of 3 mmHg. FINDINGS  Left Ventricle: Left ventricular ejection fraction, by estimation, is 60 to 65%. The left ventricle has normal function. The left ventricle has no regional wall motion abnormalities. The left ventricular internal cavity size was normal in size. There is  no left ventricular hypertrophy. Left ventricular diastolic parameters are consistent with Grade I diastolic dysfunction (impaired relaxation). Elevated left ventricular end-diastolic pressure. Right Ventricle: The right ventricular size is normal. No increase in right ventricular wall thickness. Right ventricular systolic function is normal. There is mildly elevated pulmonary artery systolic pressure. The tricuspid regurgitant velocity is 3.04  m/s, and with an assumed right atrial pressure of 3 mmHg, the estimated right ventricular systolic pressure is 78.6 mmHg. Left Atrium: Left atrial size was normal in size. Right Atrium: Right atrial size was normal in size. Pericardium: There is no evidence of pericardial effusion. Mitral Valve: The mitral valve is normal in structure. Normal mobility of the mitral valve  leaflets. No evidence of mitral valve regurgitation. No evidence of mitral valve stenosis. Tricuspid Valve: The tricuspid valve is normal in structure. Tricuspid valve regurgitation is mild . No evidence of tricuspid stenosis. Aortic Valve: The aortic valve is normal in structure. Aortic valve regurgitation is not visualized. No aortic stenosis is present. Pulmonic Valve: The pulmonic valve was normal in structure. Pulmonic valve regurgitation is not visualized. No evidence of pulmonic stenosis. Aorta: The aortic root is normal in size and structure. Venous: The inferior vena cava is normal in size with greater than 50% respiratory variability, suggesting right atrial pressure of 3 mmHg. IAS/Shunts: No atrial level shunt detected by color flow Doppler.  LEFT VENTRICLE PLAX 2D LVIDd:         3.80 cm  Diastology LVIDs:         2.30 cm  LV e' lateral:   7.29 cm/s LV PW:         0.80 cm  LV E/e' lateral: 9.5 LV IVS:        0.60 cm  LV e' medial:    4.24 cm/s LVOT diam:     1.80 cm  LV E/e' medial:  16.2 LV SV:         49 LV SV Index:   37 LVOT Area:     2.54 cm  RIGHT VENTRICLE RV S prime:     11.50 cm/s TAPSE (M-mode): 2.1 cm LEFT ATRIUM           Index      RIGHT ATRIUM          Index LA diam:      2.40 cm 1.78 cm/m RA Area:     8.16 cm LA Vol (A4C): 13.3 ml 9.86 ml/m RA Volume:   13.70 ml 10.16 ml/m  AORTIC VALVE LVOT Vmax:   104.00 cm/s LVOT Vmean:  73.900 cm/s LVOT VTI:    0.194 m  AORTA Ao Root diam: 2.80 cm MITRAL VALVE                TRICUSPID VALVE MV Area (PHT): 3.46 cm     TR Peak grad:   37.0 mmHg MV Decel Time: 219 msec     TR Vmax:        304.00 cm/s MV E velocity: 68.90 cm/s MV A velocity: 101.00 cm/s  SHUNTS MV E/A ratio:  0.68         Systemic VTI:  0.19 m                             Systemic Diam: 1.80 cm Skeet Latch MD Electronically signed by Skeet Latch MD Signature Date/Time: 08/19/2019/11:21:53 AM    Final    VAS US CAROTID  Result Date: 08/19/2019 Carotid Arterial Duplex Study  Other Factors:     ESRD, on dialysis. Limitations        Today's exam was limited due to Dizziness. Orthostasis. Comparison Study:  No prior study on file for comparison. Performing Technologist: Sharion Dove RVS  Examination Guidelines: A complete evaluation includes B-mode imaging, spectral Doppler, color Doppler, and power Doppler as needed of all accessible portions of each vessel. Bilateral testing is considered an integral part of a complete examination. Limited examinations for reoccurring indications may be performed as noted.  Right Carotid Findings: +----------+--------+--------+--------+------------------+------------------+           PSV cm/sEDV cm/sStenosisPlaque DescriptionComments           +----------+--------+--------+--------+------------------+------------------+ CCA Prox  53      14                                intimal thickening +----------+--------+--------+--------+------------------+------------------+ CCA Distal65      21                                intimal thickening +----------+--------+--------+--------+------------------+------------------+ ICA Prox  68      21                                                   +----------+--------+--------+--------+------------------+------------------+ ICA Distal106     41                                                   +----------+--------+--------+--------+------------------+------------------+ ECA       95      12                                                   +----------+--------+--------+--------+------------------+------------------+ +----------+--------+-------+--------+-------------------+           PSV cm/sEDV cmsDescribeArm Pressure (mmHG) +----------+--------+-------+--------+-------------------+ [id number]                                         +----------+--------+-------+--------+-------------------+ +---------+--------+--+--------+--+ VertebralPSV cm/s49EDV cm/s13  +---------+--------+--+--------+--+  Left Carotid Findings: +----------+--------+--------+--------+------------------+------------------+           PSV cm/sEDV cm/sStenosisPlaque DescriptionComments           +----------+--------+--------+--------+------------------+------------------+ CCA Prox  55      18  intimal thickening +----------+--------+--------+--------+------------------+------------------+ CCA Distal62      21                                intimal thickening +----------+--------+--------+--------+------------------+------------------+ ICA Prox  73      24                                                   +----------+--------+--------+--------+------------------+------------------+ ICA Distal87      31                                tortuous           +----------+--------+--------+--------+------------------+------------------+ ECA       62      7                                                    +----------+--------+--------+--------+------------------+------------------+ +----------+--------+--------+--------+-------------------+           PSV cm/sEDV cm/sDescribeArm Pressure (mmHG) +----------+--------+--------+--------+-------------------+ [id number]                                          +----------+--------+--------+--------+-------------------+ +---------+--------+--+--------+--+ VertebralPSV cm/s27EDV cm/s10 +---------+--------+--+--------+--+   Summary: Right Carotid: The extracranial vessels were near-normal with only minimal wall                thickening or plaque. Left Carotid: The extracranial vessels were near-normal with only minimal wall               thickening or plaque. Vertebrals:  Bilateral vertebral arteries demonstrate antegrade flow. Subclavians: Normal flow hemodynamics were seen in bilateral subclavian              arteries. *See table(s) above for measurements and observations.   Electronically signed by Antony Contras MD on 08/19/2019 at 1:58:43 PM.    Final         Scheduled Meds: . aspirin EC  81 mg Oral Daily  . atorvastatin  10 mg Oral q1800  . calcitRIOL  0.25 mcg Oral Daily  . fluticasone furoate-vilanterol  1 puff Inhalation Daily  . gentamicin cream  1 application Topical Daily  . heparin  5,000 Units Subcutaneous Q8H  . multivitamin  1 tablet Oral QHS  . pantoprazole  40 mg Oral Daily  . potassium chloride  40 mEq Oral Daily  . sodium chloride flush  3 mL Intravenous Q12H  . sodium chloride flush  3 mL Intravenous Q12H   Continuous Infusions: . sodium chloride    . dialysis solution 1.5% low-MG/low-CA       LOS: 0 days   Time spent= 35 mins    Gracynn Rajewski Arsenio Loader, MD [facility]  If 7PM-7AM, please contact night-coverage  08/20/2019, 1:28 PM

## 2019-08-20 NOTE — Progress Notes (Signed)
Initial Nutrition Assessment  DOCUMENTATION CODES:   Underweight  INTERVENTION:   Snacks TID  Continue Rena-Vit daily  Continue Regular diet   NUTRITION DIAGNOSIS:   Inadequate oral intake related to chronic illness as evidenced by percent weight loss, per patient/family report.  GOAL:   Patient will meet greater than or equal to 90% of their needs  MONITOR:   PO intake, Weight trends, Labs  REASON FOR ASSESSMENT:   Consult Assessment of nutrition requirement/status  ASSESSMENT:   74 yo female admitted with orthostatic hypotension/presyncope, wt loss. PMH ESRD on CCPD, COPD, HTN, IBS, esophageal stricture, CHF   Yesterday diet liberalized to Regular, agree with continuing liberalized diet for now  Recorded po intake 50-100% of meals. Per previous discussions with patient, she does not like Oral nutrition supplements. Plan for snacks TID between meals  CCPD prescription: 1.5% Dextrose, 4 exchanges in 24 hours, 2L/exchange. Estimated calories absorbed: 160-208 kcals/24 hours  Outpt EDW 45 kg. Current weight 41 kg; admission weight 39.9 kg. Pt with continued weight loss, currently under EDW which is indicator that pt has experienced true weight loss not associated with fluid status.   Pt needs nutrition focused exam on follow-up  Labs:  phosphorus 4.6 (wdl), potassium 3.6 (wdl) Meds: calcitriol, Rena-Vit, Kcl   Diet Order:   Diet Order            Diet regular Room service appropriate? Yes; Fluid consistency: Thin  Diet effective now              EDUCATION NEEDS:   Education needs have been addressed  Skin:  Skin Assessment: Reviewed RN Assessment  Last BM:  4/10  Height:   Ht Readings from Last 1 Encounters:  08/18/19 5\' 2"  (1.575 m)    Weight:   Wt Readings from Last 1 Encounters:  08/20/19 41 kg    BMI:  Body mass index is 16.53 kg/m.  Estimated Nutritional Needs:   Kcal:  1500-1700 kcals  Protein:  75-85 g  Fluid:  1.5  L    Kerman Passey MS, RDN, LDN, CNSC RD Pager Number and Weekend/On-Call After Hours Pager Located in [place]

## 2019-08-20 NOTE — Progress Notes (Addendum)
[name]   Subjective: Feels better. Has been up walking with PT. Tolerating well.   Objective Vitals:   08/20/19 0914 08/20/19 0916 08/20/19 0919 08/20/19 0935  BP: (!) 104/57 93/60 101/65 114/80  Pulse: 77 85 85 87  Resp:    18  Temp:      TempSrc:      SpO2: 100% 100% 100%   Weight:      Height:       Physical Exam General: Thin older female in NAD Heart: S1,S2 RRR Lungs: CTAB Abdomen: S, NT Extremities: No LE edema Dialysis Access: PD catheter drsg CDI.    Additional Objective Labs: Basic Metabolic Panel: Recent Labs  Lab 08/18/19 1514 08/19/19 0539 08/20/19 0517  NA 138 138 136  K 3.2* 3.8 3.6  CL 95* 98 102  CO2 27 23 24   GLUCOSE 96 86 95  BUN 42* 42* 37*  CREATININE 10.54* 10.57* 8.56*  CALCIUM 9.3 8.9 8.3*  PHOS  --   --  4.6   Liver Function Tests: Recent Labs  Lab 08/18/19 1514 08/20/19 0517  AST 30  --   ALT 26  --   ALKPHOS 67  --   BILITOT 0.8  --   PROT 7.4  --   ALBUMIN 3.7 2.5*   No results for input(s): LIPASE, AMYLASE in the last 168 hours. CBC: Recent Labs  Lab 08/18/19 1514 08/19/19 0539 08/20/19 0517  WBC 6.7 6.6 5.5  NEUTROABS 4.4  --   --   HGB 12.3 10.4* 9.1*  HCT 36.6 31.2* 27.9*  MCV 95.8 96.0 96.9  PLT 265 255 208   Blood Culture    Component Value Date/Time   SDES URINE, CLEAN CATCH 06/25/2019 2051   SPECREQUEST NONE 06/25/2019 2051   CULT (A) 06/25/2019 2051    <10,000 COLONIES/mL INSIGNIFICANT GROWTH Performed at [facility] [address] [place], [place] [address]    REPTSTATUS 06/27/2019 FINAL 06/25/2019 2051    Cardiac Enzymes: No results for input(s): CKTOTAL, CKMB, CKMBINDEX, TROPONINI in the last 168 hours. CBG: No results for input(s): GLUCAP in the last 168 hours. Iron Studies: No results for input(s): IRON, TIBC, TRANSFERRIN, FERRITIN in the last 72 hours. @lablastinr3 @ Studies/Results: DG Chest 2 View  Result Date: 08/18/2019 CLINICAL DATA:   74 year old female with shortness of breath. EXAM: CHEST - 2 VIEW COMPARISON:  Chest radiograph dated 07/02/2018 and CT dated 07/03/2019 FINDINGS: There is background of emphysema. Postsurgical changes of left upper lobe and interstitial coarsening of the right mid lung field similar to prior radiograph. No new consolidation. There is no pleural effusion or pneumothorax. Stable cardiac silhouette. Atherosclerotic calcification of the aorta. No acute osseous pathology. Degenerative changes of the spine. Two ovoid radiopaque structures in the region of the gastroesophageal junction each measuring approximately 14 mm likely represent ingested pills in the distal esophagus or gastroesophageal junction. No acute osseous pathology. IMPRESSION: 1. No acute cardiopulmonary process. 2. Emphysema. 3. Postsurgical changes of left upper lobe and right mid lung field scarring. 4. Probable two pills in the region of the gastroesophageal junction. Electronically Signed   By: Anner Crete M.D.   On: 08/18/2019 15:06   CT Head Wo Contrast  Result Date: 08/18/2019 CLINICAL DATA:  74 year old female with dizziness. EXAM: CT HEAD WITHOUT CONTRAST TECHNIQUE: Contiguous axial images were obtained from the base of the skull through the vertex without intravenous contrast. COMPARISON:  Head CT dated 12/02/2015. FINDINGS: Brain: Mild age-related atrophy and  chronic microvascular ischemic changes. There is no acute intracranial hemorrhage. No mass effect or midline shift. No extra-axial fluid collection. Vascular: No hyperdense vessel or unexpected calcification. Skull: Normal. Negative for fracture or focal lesion. Sinuses/Orbits: No acute finding. Other: None IMPRESSION: 1. No acute intracranial pathology. 2. Mild age-related atrophy and chronic microvascular ischemic changes. Electronically Signed   By: Anner Crete M.D.   On: 08/18/2019 19:28   ECHOCARDIOGRAM COMPLETE  Result Date: 08/19/2019    ECHOCARDIOGRAM REPORT    Patient Name:   [name] Date of Exam: 08/19/2019 Medical Rec #:  [id number]        Height:       62.0 in Accession #:    [id number]       Weight:       88.0 lb Date of Birth:  30-Jul-1945        BSA:          1.348 m Patient Age:    74 years         BP:           98/73 mmHg Patient Gender: F                HR:           95 bpm. Exam Location:  Inpatient Procedure: 2D Echo Indications:    dyspnea 786.09  History:        Patient has no prior history of Echocardiogram examinations.                 COPD; Risk Factors:Hypertension, Dyslipidemia and Former Smoker.  Sonographer:    Jannett Celestine RDCS (AE) Referring Phys: [id number] [place]  1. Left ventricular ejection fraction, by estimation, is 60 to 65%. The left ventricle has normal function. The left ventricle has no regional wall motion abnormalities. Left ventricular diastolic parameters are consistent with Grade I diastolic dysfunction (impaired relaxation). Elevated left ventricular end-diastolic pressure.  2. Right ventricular systolic function is normal. The right ventricular size is normal. There is mildly elevated pulmonary artery systolic pressure.  3. The mitral valve is normal in structure. No evidence of mitral valve regurgitation. No evidence of mitral stenosis.  4. The aortic valve is normal in structure. Aortic valve regurgitation is not visualized. No aortic stenosis is present.  5. The inferior vena cava is normal in size with greater than 50% respiratory variability, suggesting right atrial pressure of 3 mmHg. FINDINGS  Left Ventricle: Left ventricular ejection fraction, by estimation, is 60 to 65%. The left ventricle has normal function. The left ventricle has no regional wall motion abnormalities. The left ventricular internal cavity size was normal in size. There is  no left ventricular hypertrophy. Left ventricular diastolic parameters are consistent with Grade I diastolic dysfunction (impaired relaxation). Elevated left  ventricular end-diastolic pressure. Right Ventricle: The right ventricular size is normal. No increase in right ventricular wall thickness. Right ventricular systolic function is normal. There is mildly elevated pulmonary artery systolic pressure. The tricuspid regurgitant velocity is 3.04  m/s, and with an assumed right atrial pressure of 3 mmHg, the estimated right ventricular systolic pressure is 30.8 mmHg. Left Atrium: Left atrial size was normal in size. Right Atrium: Right atrial size was normal in size. Pericardium: There is no evidence of pericardial effusion. Mitral Valve: The mitral valve is normal in structure. Normal mobility of the mitral valve leaflets. No evidence of mitral valve regurgitation. No evidence of mitral valve stenosis. Tricuspid Valve: The tricuspid  valve is normal in structure. Tricuspid valve regurgitation is mild . No evidence of tricuspid stenosis. Aortic Valve: The aortic valve is normal in structure. Aortic valve regurgitation is not visualized. No aortic stenosis is present. Pulmonic Valve: The pulmonic valve was normal in structure. Pulmonic valve regurgitation is not visualized. No evidence of pulmonic stenosis. Aorta: The aortic root is normal in size and structure. Venous: The inferior vena cava is normal in size with greater than 50% respiratory variability, suggesting right atrial pressure of 3 mmHg. IAS/Shunts: No atrial level shunt detected by color flow Doppler.  LEFT VENTRICLE PLAX 2D LVIDd:         3.80 cm  Diastology LVIDs:         2.30 cm  LV e' lateral:   7.29 cm/s LV PW:         0.80 cm  LV E/e' lateral: 9.5 LV IVS:        0.60 cm  LV e' medial:    4.24 cm/s LVOT diam:     1.80 cm  LV E/e' medial:  16.2 LV SV:         49 LV SV Index:   37 LVOT Area:     2.54 cm  RIGHT VENTRICLE RV S prime:     11.50 cm/s TAPSE (M-mode): 2.1 cm LEFT ATRIUM           Index      RIGHT ATRIUM          Index LA diam:      2.40 cm 1.78 cm/m RA Area:     8.16 cm LA Vol (A4C): 13.3 ml  9.86 ml/m RA Volume:   13.70 ml 10.16 ml/m  AORTIC VALVE LVOT Vmax:   104.00 cm/s LVOT Vmean:  73.900 cm/s LVOT VTI:    0.194 m  AORTA Ao Root diam: 2.80 cm MITRAL VALVE                TRICUSPID VALVE MV Area (PHT): 3.46 cm     TR Peak grad:   37.0 mmHg MV Decel Time: 219 msec     TR Vmax:        304.00 cm/s MV E velocity: 68.90 cm/s MV A velocity: 101.00 cm/s  SHUNTS MV E/A ratio:  0.68         Systemic VTI:  0.19 m                             Systemic Diam: 1.80 cm Skeet Latch MD Electronically signed by Skeet Latch MD Signature Date/Time: 08/19/2019/11:21:53 AM    Final    VAS US CAROTID  Result Date: 08/19/2019 Carotid Arterial Duplex Study Other Factors:     ESRD, on dialysis. Limitations        Today's exam was limited due to Dizziness. Orthostasis. Comparison Study:  No prior study on file for comparison. Performing Technologist: Sharion Dove RVS  Examination Guidelines: A complete evaluation includes B-mode imaging, spectral Doppler, color Doppler, and power Doppler as needed of all accessible portions of each vessel. Bilateral testing is considered an integral part of a complete examination. Limited examinations for reoccurring indications may be performed as noted.  Right Carotid Findings: +----------+--------+--------+--------+------------------+------------------+           PSV cm/sEDV cm/sStenosisPlaque DescriptionComments           +----------+--------+--------+--------+------------------+------------------+ CCA Prox  53      14  intimal thickening +----------+--------+--------+--------+------------------+------------------+ CCA Distal65      21                                intimal thickening +----------+--------+--------+--------+------------------+------------------+ ICA Prox  68      21                                                   +----------+--------+--------+--------+------------------+------------------+ ICA  Distal106     41                                                   +----------+--------+--------+--------+------------------+------------------+ ECA       95      12                                                   +----------+--------+--------+--------+------------------+------------------+ +----------+--------+-------+--------+-------------------+           PSV cm/sEDV cmsDescribeArm Pressure (mmHG) +----------+--------+-------+--------+-------------------+ [id number]                                         +----------+--------+-------+--------+-------------------+ +---------+--------+--+--------+--+ VertebralPSV cm/s49EDV cm/s13 +---------+--------+--+--------+--+  Left Carotid Findings: +----------+--------+--------+--------+------------------+------------------+           PSV cm/sEDV cm/sStenosisPlaque DescriptionComments           +----------+--------+--------+--------+------------------+------------------+ CCA Prox  55      18                                intimal thickening +----------+--------+--------+--------+------------------+------------------+ CCA Distal62      21                                intimal thickening +----------+--------+--------+--------+------------------+------------------+ ICA Prox  73      24                                                   +----------+--------+--------+--------+------------------+------------------+ ICA Distal87      31                                tortuous           +----------+--------+--------+--------+------------------+------------------+ ECA       62      7                                                    +----------+--------+--------+--------+------------------+------------------+ +----------+--------+--------+--------+-------------------+           PSV  cm/sEDV cm/sDescribeArm Pressure (mmHG) +----------+--------+--------+--------+-------------------+ [id number]                                           +----------+--------+--------+--------+-------------------+ +---------+--------+--+--------+--+ VertebralPSV cm/s27EDV cm/s10 +---------+--------+--+--------+--+   Summary: Right Carotid: The extracranial vessels were near-normal with only minimal wall                thickening or plaque. Left Carotid: The extracranial vessels were near-normal with only minimal wall               thickening or plaque. Vertebrals:  Bilateral vertebral arteries demonstrate antegrade flow. Subclavians: Normal flow hemodynamics were seen in bilateral subclavian              arteries. *See table(s) above for measurements and observations.  Electronically signed by Antony Contras MD on 08/19/2019 at 1:58:43 PM.    Final    Medications: . sodium chloride    . dialysis solution 1.5% low-MG/low-CA     . aspirin EC  81 mg Oral Daily  . atorvastatin  10 mg Oral q1800  . calcitRIOL  0.25 mcg Oral Daily  . fluticasone furoate-vilanterol  1 puff Inhalation Daily  . gentamicin cream  1 application Topical Daily  . heparin  5,000 Units Subcutaneous Q8H  . multivitamin  1 tablet Oral QHS  . pantoprazole  40 mg Oral Daily  . potassium chloride  40 mEq Oral Daily  . sodium chloride flush  3 mL Intravenous Q12H  . sodium chloride flush  3 mL Intravenous Q12H     Dialysis Orders: CCPD, 7X Week, EDW 45 (kg) Exchanges 4, fill vol 2000 mL, Dwell Time 1 hrs 30 min, last fill vol 0 mL, Day Exchanges 0, daytime fill vol 0 mL, Day Dwell Time 0 hrs 0 min. Supposed to be using all 1.5% solution  Assessment/Plan: 1.  Dizziness/hypotension: Improved today after IVF and PD last night using all 1.5% solution. Walked in hall with PT. Discussed following PD orders as given. W/U otherwise negative to explain symptoms.  2. Hypokalemia: Continue K DUR 20 MEQ 2 tabs PO daily. K+ 3.6 this AM 3. Acute hypoxic respiratory failure-unclear etiology. No evidence of volume overload by CXR or exam. Per  primary 4. ESRD -  Continue CCPD as noted above. Daily RFP. Daily weights.  5.  Hypertension/volume  - BP improved today. No antihypertensive meds. Consider midodrine if hypotension does not improve with IVF, lower concentration dialysate. Weight markedly lower from OP EDW. Getting standing wts.  6.  Anemia  - HGB 9.1. Last HGB 12.0 Tsat 65 08/15/2019. No overt blood loss. Calling G 7.  Metabolic bone disease -  Continue Auryxia, VDRA 8.  Nutrition -Albumin surprisingly good in the setting of recent unintentional wt loss. Nepro, renal vits.  Rita H. Brown NP-C 08/20/2019, 10:23 AM  [facility] [phone]  I have seen and examined this patient and agree with plan and assessment in the above [name] with renal recommendations/intervention highlighted.  Feeling better today.  Still with lowish BP readings.  Consider adding midodrine daily if continues to drop.   Governor Rooks Camarie Mctigue,MD 08/20/2019 3:57 PM

## 2019-08-21 LAB — RENAL FUNCTION PANEL
Albumin: 2.6 g/dL — ABNORMAL LOW (ref 3.5–5.0)
Anion gap: 12 (ref 5–15)
BUN: 32 mg/dL — ABNORMAL HIGH (ref 8–23)
CO2: 24 mmol/L (ref 22–32)
Calcium: 8.8 mg/dL — ABNORMAL LOW (ref 8.9–10.3)
Chloride: 100 mmol/L (ref 98–111)
Creatinine, Ser: 7.68 mg/dL — ABNORMAL HIGH (ref 0.44–1.00)
GFR calc Af Amer: 5 mL/min — ABNORMAL LOW (ref >60)
GFR calc non Af Amer: 5 mL/min — ABNORMAL LOW (ref >60)
Glucose, Bld: 103 mg/dL — ABNORMAL HIGH (ref 70–99)
Phosphorus: 3.7 mg/dL (ref 2.5–4.6)
Potassium: 3.8 mmol/L (ref 3.5–5.1)
Sodium: 136 mmol/L (ref 135–145)

## 2019-08-21 LAB — CBC
HCT: 28.2 % — ABNORMAL LOW (ref 36.0–46.0)
Hemoglobin: 9.5 g/dL — ABNORMAL LOW (ref 12.0–15.0)
MCH: 31.7 pg (ref 26.0–34.0)
MCHC: 33.7 g/dL (ref 30.0–36.0)
MCV: 94 fL (ref 80.0–100.0)
Platelets: 247 10*3/uL (ref 150–400)
RBC: 3 MIL/uL — ABNORMAL LOW (ref 3.87–5.11)
RDW: 13.7 % (ref 11.5–15.5)
WBC: 5.8 10*3/uL (ref 4.0–10.5)
nRBC: 0 % (ref 0.0–0.2)

## 2019-08-21 MED ORDER — MIDODRINE HCL 5 MG PO TABS: 5.0000 mg | ORAL_TABLET | Freq: Three times a day (TID) | ORAL | 0 refills | Status: DC

## 2019-08-21 MED ORDER — DARBEPOETIN ALFA 25 MCG/0.42ML IJ SOSY
25.0000 ug | PREFILLED_SYRINGE | Freq: Once | INTRAMUSCULAR | Status: AC
  Administered 2019-08-21: 25 ug via SUBCUTANEOUS
  Filled 2019-08-21 (×2): qty 0.42

## 2019-08-21 MED ORDER — ZINC SULFATE 220 (50 ZN) MG PO CAPS: 220.0000 mg | ORAL_CAPSULE | Freq: Every day | ORAL | Status: DC

## 2019-08-21 MED ORDER — MIDODRINE HCL 5 MG PO TABS
10.0000 mg | ORAL_TABLET | Freq: Three times a day (TID) | ORAL | Status: DC
  Administered 2019-08-21: 10 mg via ORAL
  Filled 2019-08-21: qty 2

## 2019-08-21 MED FILL — MIDODRINE HCL 5 MG TABS: 5 | 30 days supply | Qty: 90 | Fill #0

## 2019-08-21 NOTE — Plan of Care (Signed)
  Problem: Acute Rehab PT Goals(only PT should resolve) Goal: Pt Will Ambulate Outcome: Completed/Met Goal: Pt Will Go Up/Down Stairs Outcome: Completed/Met Goal: Pt/caregiver will Perform Home Exercise Program Outcome: Completed/Met

## 2019-08-21 NOTE — Plan of Care (Signed)
  Problem: Health Behavior/Discharge Planning: Goal: Ability to manage health-related needs will improve Outcome: Completed/Met   Problem: Clinical Measurements: Goal: Ability to maintain clinical measurements within normal limits will improve Outcome: Completed/Met Goal: Will remain free from infection Outcome: Completed/Met Goal: Diagnostic test results will improve Outcome: Completed/Met Goal: Respiratory complications will improve Outcome: Completed/Met Goal: Cardiovascular complication will be avoided Outcome: Completed/Met   Problem: Activity: Goal: Risk for activity intolerance will decrease Outcome: Completed/Met   Problem: Elimination: Goal: Will not experience complications related to bowel motility Outcome: Completed/Met Goal: Will not experience complications related to urinary retention Outcome: Completed/Met   Problem: Safety: Goal: Ability to remain free from injury will improve Outcome: Completed/Met   Problem: Skin Integrity: Goal: Risk for impaired skin integrity will decrease Outcome: Completed/Met   Problem: Education: Goal: Knowledge of disease and its progression will improve Outcome: Completed/Met Goal: Individualized Educational Video(s) Outcome: Completed/Met   Problem: Fluid Volume: Goal: Compliance with measures to maintain balanced fluid volume will improve Outcome: Completed/Met   Problem: Health Behavior/Discharge Planning: Goal: Ability to manage health-related needs will improve Outcome: Completed/Met   Problem: Nutritional: Goal: Ability to make healthy dietary choices will improve Outcome: Completed/Met   Problem: Clinical Measurements: Goal: Complications related to the disease process, condition or treatment will be avoided or minimized Outcome: Completed/Met  Discharge information / instructions reviewed with patient.  These included, but were not limited to, the following:  discharge medications, when to call the MD,  dietary recommendations, activity recommendations, PD schedule, signs of infection, patient concerns, pain management recommendations, follow-up appointments, etc.  Comprehension of information ascertained via "teach-back" technique.

## 2019-08-21 NOTE — TOC Transition Note (Signed)
Transition of Care [facility]) - CM/SW Discharge Note   Patient Details  Name: [name] MRN: [id number] Date of Birth: 06/01/1945  [facility] CM/SW Contact:  Bartholomew Crews, RN Phone Number: [phone] 08/21/2019, 11:55 AM   Clinical Narrative:     Patient to transition home today. [place] orders placed in Epic for PT, aide. [facility] liaison notified of discharge. No furhter TOC needs identified.    Final next level of care: [place] Barriers to Discharge: No Barriers Identified   Patient Goals and CMS Choice Patient states their goals for this hospitalization and ongoing recovery are:: to feel better, less dizzy CMS Medicare.gov Compare Post Acute Care list provided to:: Patient Choice offered to / list presented to : Patient  Discharge Placement                       Discharge Plan and Services In-house Referral: Clinical Social Work Discharge Planning Services: CM Consult Post Acute Care Choice: Home Health                    HH Arranged: PT, Nurse's Aide [facility] Agency: [place] Date [place]: 08/21/19 Time [place]: 7847 Representative spoke with at [place]: [place] ([place]) Interventions     Readmission Risk Interventions No flowsheet data found.

## 2019-08-21 NOTE — Progress Notes (Addendum)
[facility] Progress Note   Subjective:     Objective Vitals:   08/21/19 0722 08/21/19 0800 08/21/19 0900 08/21/19 0919  BP:  91/63 (!) 107/56 101/65  Pulse: 68 66 68 68  Resp: 16 14 18 16   Temp:  97.7 F (36.5 C) 97.9 F (36.6 C) (!) 97.3 F (36.3 C)  TempSrc:  Axillary Oral Oral  SpO2: 98% 100% 100%   Weight:      Height:       Physical Exam General: Heart: Lungs: Abdomen: Extremities: Dialysis Access:   Dialysis Orders:  Additional Objective Labs: Basic Metabolic Panel: Recent Labs  Lab 08/19/19 0539 08/20/19 0517 08/21/19 0222  NA 138 136 136  K 3.8 3.6 3.8  CL 98 102 100  CO2 23 24 24   GLUCOSE 86 95 103*  BUN 42* 37* 32*  CREATININE 10.57* 8.56* 7.68*  CALCIUM 8.9 8.3* 8.8*  PHOS  --  4.6 3.7   Liver Function Tests: Recent Labs  Lab 08/18/19 1514 08/20/19 0517 08/21/19 0222  AST 30  --   --   ALT 26  --   --   ALKPHOS 67  --   --   BILITOT 0.8  --   --   PROT 7.4  --   --   ALBUMIN 3.7 2.5* 2.6*   No results for input(s): LIPASE, AMYLASE in the last 168 hours. CBC: Recent Labs  Lab 08/18/19 1514 08/18/19 1514 08/19/19 0539 08/20/19 0517 08/21/19 0222  WBC 6.7   < > 6.6 5.5 5.8  NEUTROABS 4.4  --   --   --   --   HGB 12.3   < > 10.4* 9.1* 9.5*  HCT 36.6   < > 31.2* 27.9* 28.2*  MCV 95.8  --  96.0 96.9 94.0  PLT 265   < > 255 208 247   < > = values in this interval not displayed.   Blood Culture    Component Value Date/Time   SDES URINE, CLEAN CATCH 06/25/2019 2051   [place] NONE 06/25/2019 2051   CULT (A) 06/25/2019 2051    <10,000 COLONIES/mL INSIGNIFICANT GROWTH Performed at [facility], [place] [address], [place], [place] [address]    REPTSTATUS 06/27/2019 FINAL 06/25/2019 2051    Cardiac Enzymes: No results for input(s): CKTOTAL, CKMB, CKMBINDEX, TROPONINI in the last 168 hours. CBG: No results for input(s): GLUCAP in the last 168 hours. Iron Studies: No results for input(s): IRON, TIBC,  TRANSFERRIN, FERRITIN in the last 72 hours. @lablastinr3 @ Studies/Results: VAS US CAROTID  Result Date: 08/19/2019 Carotid Arterial Duplex Study Other Factors:     ESRD, on dialysis. Limitations        Today's exam was limited due to Dizziness. Orthostasis. Comparison Study:  No prior study on file for comparison. Performing Technologist: Sharion Dove RVS  Examination Guidelines: A complete evaluation includes B-mode imaging, spectral Doppler, color Doppler, and power Doppler as needed of all accessible portions of each vessel. Bilateral testing is considered an integral part of a complete examination. Limited examinations for reoccurring indications may be performed as noted.  Right Carotid Findings: +----------+--------+--------+--------+------------------+------------------+           PSV cm/sEDV cm/sStenosisPlaque DescriptionComments           +----------+--------+--------+--------+------------------+------------------+ CCA Prox  53      14  intimal thickening +----------+--------+--------+--------+------------------+------------------+ CCA Distal65      21                                intimal thickening +----------+--------+--------+--------+------------------+------------------+ ICA Prox  68      21                                                   +----------+--------+--------+--------+------------------+------------------+ ICA Distal106     41                                                   +----------+--------+--------+--------+------------------+------------------+ ECA       95      12                                                   +----------+--------+--------+--------+------------------+------------------+ +----------+--------+-------+--------+-------------------+           PSV cm/sEDV cmsDescribeArm Pressure (mmHG) +----------+--------+-------+--------+-------------------+ [id number]                                          +----------+--------+-------+--------+-------------------+ +---------+--------+--+--------+--+ VertebralPSV cm/s49EDV cm/s13 +---------+--------+--+--------+--+  Left Carotid Findings: +----------+--------+--------+--------+------------------+------------------+           PSV cm/sEDV cm/sStenosisPlaque DescriptionComments           +----------+--------+--------+--------+------------------+------------------+ CCA Prox  55      18                                intimal thickening +----------+--------+--------+--------+------------------+------------------+ CCA Distal62      21                                intimal thickening +----------+--------+--------+--------+------------------+------------------+ ICA Prox  73      24                                                   +----------+--------+--------+--------+------------------+------------------+ ICA Distal87      31                                tortuous           +----------+--------+--------+--------+------------------+------------------+ ECA       62      7                                                    +----------+--------+--------+--------+------------------+------------------+ +----------+--------+--------+--------+-------------------+           PSV  cm/sEDV cm/sDescribeArm Pressure (mmHG) +----------+--------+--------+--------+-------------------+ [id number]                                          +----------+--------+--------+--------+-------------------+ +---------+--------+--+--------+--+ VertebralPSV cm/s27EDV cm/s10 +---------+--------+--+--------+--+   Summary: Right Carotid: The extracranial vessels were near-normal with only minimal wall                thickening or plaque. Left Carotid: The extracranial vessels were near-normal with only minimal wall               thickening or plaque. Vertebrals:  Bilateral vertebral arteries demonstrate antegrade flow.  Subclavians: Normal flow hemodynamics were seen in bilateral subclavian              arteries. *See table(s) above for measurements and observations.  Electronically signed by Antony Contras MD on 08/19/2019 at 1:58:43 PM.    Final    Medications: . sodium chloride    . dialysis solution 1.5% low-MG/low-CA     . aspirin EC  81 mg Oral Daily  . atorvastatin  10 mg Oral q1800  . calcitRIOL  0.25 mcg Oral Daily  . fluticasone furoate-vilanterol  1 puff Inhalation Daily  . gentamicin cream  1 application Topical Daily  . heparin  5,000 Units Subcutaneous Q8H  . multivitamin  1 tablet Oral QHS  . pantoprazole  40 mg Oral Daily  . potassium chloride  40 mEq Oral Daily  . sodium chloride flush  3 mL Intravenous Q12H  . sodium chloride flush  3 mL Intravenous Q12H     Dialysis Orders:CCPD, 7X Week, EDW 45 (kg) Exchanges 4, fill vol 2000 mL, Dwell Time 1 hrs 30 min, last fill vol 0 mL, Day Exchanges 0, daytime fill vol 0 mL, Day Dwell Time 0 hrs 0 min. Supposed to be using all 1.5% solution  Assessment/Plan: 1. Dizziness/hypotension: Continues to improve. Up at bedside today without complaints of dizziness, SOB improving.  PD last night using all 1.5% solution.   2. Hypokalemia: Continue K DUR 20 MEQ 2 tabs PO daily. K+ 3.6 this AM 3. Acute hypoxic respiratory failure-unclear etiology. No evidence of volume overload by CXR or exam. Per primary 4. ESRD -Continue CCPD as noted above. SCr improved to 7.68 BUN 32. Continue all 1.5% solutions.  5. Hypertension/volume - BP improved today but still on low side. Start midodrine 10 mg PO TID. Weight markedly lower from OP EDW. Called OP center, wt has been as low 39.5 kg there. Post wt 42 kgs today. Adjust EDW on discharge.  6. Anemia - HGB 9.5. Last HGB 12.0 Tsat 65 08/15/2019. No overt blood loss. Does not usually require ESA. Give Aranesp 25 mcg SQ today X 1 dose.  7. Metabolic bone disease - Continue Auryxia, VDRA 8. Nutrition -Albumin  initially OK, falling with hospitalization. Says her appetite is not good, still can't taste food. Start Zinc 50 mg PO daily, continue Nepro, renal vits.  Disposition: Stable for discharge from nephrology stand point.     [name] H. Brown NP-C 08/21/2019, 10:45 AM  [employer] [phone]  I have seen and examined this patient and agree with plan and assessment in the above note with renal recommendations/intervention highlighted.  Feeling better and for discharge to home today.  Will resume CCPD at home tonight using all 1.5% dialysate.  F/u with Dr. Joelyn Oms this week as an outpatient.  Governor Rooks  Leza Apsey,MD 08/21/2019 3:52 PM

## 2019-08-21 NOTE — Discharge Summary (Addendum)
Physician Discharge Summary  [name] KWI:[id number] DOB: 27-Jan-1946 DOA: 08/18/2019  PCP: [facility]  Admit date: 08/18/2019 Discharge date: 08/21/2019  Admitted From: Home  Disposition:  Home   Recommendations for Outpatient Follow-up:  1. Follow up with PCP in 1-2 weeks 2. Please obtain BMP/CBC in one week your next doctors visit.  3. Add midodrine 10 mg 3 times daily   Discharge Condition: Stable CODE STATUS: Full  Diet recommendation: REnal  Brief/Interim Summary: 74 year old lives independently with history of ESRD on peritoneal dialysis, HTN, HLD, CAD status post CABG, CHF, pulmonary nodules, GERD, COPD admitted for dizziness and presyncope.  Upon admission she was noted to be orthostatic this was thought to be secondary to use of wrong dialysis late during her dialysis sessions.  Over the course of 2 days patient did well in the hospital and her dizziness started improving.  Midodrine was added because of her soft blood pressure.  Physical therapy recommended home health therefore arrangements were made.  Patient is stable to be discharged today    Orthostatic hypotension/presyncope; Resolved.  -Echocardiogram showed EF of 60 to 24%, grade 1 diastolic dysfunction.  CT of the head negative -Nephrology instructed patient to use correct solution bag.  Appreciate their input -Midodrine 10 mg 3 times daily added  ESRD on hemodialysis -Nephrology team following.  Their team will educate the patient regarding what is the correct way of dialyzing herself-she is on peritoneal dialysis  Essential hypertension -Medications on hold for now  Anemia of chronic disease -Hemoglobin stable around 10  History of COPD -Bronchodilators as needed.  Discharge Diagnoses:  Principal Problem:   Orthostasis Active Problems:   ESRD (end stage renal disease) on dialysis (HCC)   Hypokalemia   COPD (chronic obstructive pulmonary disease) (HCC)   Acute respiratory  failure with hypoxia (HCC)   Postural dizziness with presyncope    Consultations:  Nephro  Subjective: Feels ok no complaints. Dizziness resolved.   Discharge Exam: Vitals:   08/21/19 0919 08/21/19 1136  BP: 101/65 110/70  Pulse: 68 78  Resp: 16 15  Temp: (!) 97.3 F (36.3 C) 98 F (36.7 C)  SpO2:  100%   Vitals:   08/21/19 0800 08/21/19 0900 08/21/19 0919 08/21/19 1136  BP: 91/63 (!) 107/56 101/65 110/70  Pulse: 66 68 68 78  Resp: 14 18 16 15   Temp: 97.7 F (36.5 C) 97.9 F (36.6 C) (!) 97.3 F (36.3 C) 98 F (36.7 C)  TempSrc: Axillary Oral Oral Oral  SpO2: 100% 100%  100%  Weight:      Height:        General: Pt is alert, awake, not in acute distress Cardiovascular: RRR, S1/S2 +, no rubs, no gallops Respiratory: CTA bilaterally, no wheezing, no rhonchi Abdominal: Soft, NT, ND, bowel sounds + Extremities: no edema, no cyanosis  Discharge Instructions   Allergies as of 08/21/2019      Reactions   Ace Inhibitors Anaphylaxis, Swelling, Other (See Comments)   Angioedema   Calcium Channel Blockers Anaphylaxis, Itching, Rash, Other (See Comments)   Red rash, Knot (sore and itches), and a fever for a couple days (per pt report)   Diltiazem Anaphylaxis   Other Anaphylaxis, Itching, Rash, Other (See Comments)   Calcium Channel Blocking Agent Diltiazem Analogues: Red rash, Knot (sore and itches), and a fever for a couple days (per pt report)   Pneumococcal Vaccines Other (See Comments)   Red rash, Knot (sore and itches), A fever for a couple days (  per pt report)   Tetanus Toxoid Swelling, Other (See Comments)   FEVER and cellulitis in an arm   Latex Rash   Reaction to gloves   Tape Rash, Other (See Comments)   PLEASE USE PAPER TAPE!!      Medication List    TAKE these medications   acetaminophen 500 MG tablet Commonly known as: TYLENOL Take 1,000 mg by mouth every 6 (six) hours as needed (for headaches or pain).   aspirin EC 81 MG tablet Take 81 mg  by mouth daily.   atorvastatin 10 MG tablet Commonly known as: LIPITOR Take 1 tablet (10 mg total) by mouth daily.   Auryxia 1 GM 210 MG(Fe) tablet Generic drug: ferric citrate Take 210 mg by mouth 3 (three) times daily with meals.   Breo Ellipta 100-25 MCG/INH Aepb Generic drug: fluticasone furoate-vilanterol Inhale 1 puff into the lungs daily.   calcitRIOL 0.25 MCG capsule Commonly known as: ROCALTROL Take 0.25 mcg by mouth daily.   Claritin-D 12 Hour 5-120 MG tablet Generic drug: loratadine-pseudoephedrine Take 1 tablet by mouth every 12 (twelve) hours.   Colace 100 MG capsule Generic drug: docusate sodium Take 100 mg by mouth daily as needed for mild constipation.   lactulose 10 GM/15ML solution Commonly known as: CHRONULAC Take 10 g by mouth daily as needed for mild constipation.   melatonin 5 MG Tabs Take 5 mg by mouth at bedtime as needed (for sleep).   midodrine 5 MG tablet Commonly known as: PROAMATINE Take 1 tablet (5 mg total) by mouth 3 (three) times daily with meals.   multivitamin Tabs tablet Take 1 tablet by mouth daily.   pantoprazole 40 MG tablet Commonly known as: PROTONIX Take 1 tablet (40 mg total) by mouth daily.   Potassium Chloride ER 20 MEQ Tbcr Take 20 mEq by mouth daily.   sodium chloride 0.65 % Soln nasal spray Commonly known as: OCEAN Place 2 sprays into both nostrils daily as needed for congestion.   torsemide 20 MG tablet Commonly known as: DEMADEX Take 20 mg by mouth daily.   Vitamin D3 50 MCG (2000 UT) Tabs Take 4,000 Units by mouth daily.      Follow-up Information    [facility] Follow up.   Specialty: [place] Why: the office will call to schedule home health visits Contact information: [place] [address] [phone]        [facility] an appointment as soon as possible for a visit in 1 week(s).   Contact information: [place] [address] [phone]          Allergies  Allergen Reactions  . Ace Inhibitors Anaphylaxis, Swelling and Other (See Comments)    Angioedema  . Calcium Channel Blockers Anaphylaxis, Itching, Rash and Other (See Comments)    Red rash, Knot (sore and itches), and a fever for a couple days (per pt report)  . Diltiazem Anaphylaxis  . Other Anaphylaxis, Itching, Rash and Other (See Comments)    Calcium Channel Blocking Agent Diltiazem Analogues: Red rash, Knot (sore and itches), and a fever for a couple days (per pt report)  . Pneumococcal Vaccines Other (See Comments)    Red rash, Knot (sore and itches), A fever for a couple days (per pt report)  . Tetanus Toxoid Swelling and Other (See Comments)    FEVER and cellulitis in an arm    . Latex Rash    Reaction  to gloves  . Tape Rash and Other (See Comments)    PLEASE USE PAPER TAPE!!    You were cared for by a hospitalist during your hospital stay. If you have any questions about your discharge medications or the care you received while you were in the hospital after you are discharged, you can call the unit and asked to speak with the hospitalist on call if the hospitalist that took care of you is not available. Once you are discharged, your primary care physician will handle any further medical issues. Please note that no refills for any discharge medications will be authorized once you are discharged, as it is imperative that you return to your primary care physician (or establish a relationship with a primary care physician if you do not have one) for your aftercare needs so that they can reassess your need for medications and monitor your lab values.   Procedures/Studies: DG Chest 2 View  Result Date: 08/18/2019 CLINICAL DATA:  74 year old female with shortness of breath. EXAM: CHEST - 2 VIEW COMPARISON:  Chest radiograph dated 07/02/2018 and CT dated 07/03/2019 FINDINGS: There is background of emphysema.  Postsurgical changes of left upper lobe and interstitial coarsening of the right mid lung field similar to prior radiograph. No new consolidation. There is no pleural effusion or pneumothorax. Stable cardiac silhouette. Atherosclerotic calcification of the aorta. No acute osseous pathology. Degenerative changes of the spine. Two ovoid radiopaque structures in the region of the gastroesophageal junction each measuring approximately 14 mm likely represent ingested pills in the distal esophagus or gastroesophageal junction. No acute osseous pathology. IMPRESSION: 1. No acute cardiopulmonary process. 2. Emphysema. 3. Postsurgical changes of left upper lobe and right mid lung field scarring. 4. Probable two pills in the region of the gastroesophageal junction. Electronically Signed   By: Anner Crete M.D.   On: 08/18/2019 15:06   CT Head Wo Contrast  Result Date: 08/18/2019 CLINICAL DATA:  74 year old female with dizziness. EXAM: CT HEAD WITHOUT CONTRAST TECHNIQUE: Contiguous axial images were obtained from the base of the skull through the vertex without intravenous contrast. COMPARISON:  Head CT dated 12/02/2015. FINDINGS: Brain: Mild age-related atrophy and chronic microvascular ischemic changes. There is no acute intracranial hemorrhage. No mass effect or midline shift. No extra-axial fluid collection. Vascular: No hyperdense vessel or unexpected calcification. Skull: Normal. Negative for fracture or focal lesion. Sinuses/Orbits: No acute finding. Other: None IMPRESSION: 1. No acute intracranial pathology. 2. Mild age-related atrophy and chronic microvascular ischemic changes. Electronically Signed   By: Anner Crete M.D.   On: 08/18/2019 19:28   ECHOCARDIOGRAM COMPLETE  Result Date: 08/19/2019    ECHOCARDIOGRAM REPORT   Patient Name:   KEIARRA CHARON Date of Exam: 08/19/2019 Medical Rec #:  [id number]        Height:       62.0 in Accession #:    [id number]       Weight:       88.0 lb Date of Birth:   [birth year]        BSA:          1.348 m Patient Age:    37 years         BP:           98/73 mmHg Patient Gender: F                HR:           95 bpm. Exam Location:  Inpatient Procedure: 2D  Echo Indications:    dyspnea 786.09  History:        Patient has no prior history of Echocardiogram examinations.                 COPD; Risk Factors:Hypertension, Dyslipidemia and Former Smoker.  Sonographer:    Jannett Celestine RDCS (AE) Referring Phys: [id number] [place]  1. Left ventricular ejection fraction, by estimation, is 60 to 65%. The left ventricle has normal function. The left ventricle has no regional wall motion abnormalities. Left ventricular diastolic parameters are consistent with Grade I diastolic dysfunction (impaired relaxation). Elevated left ventricular end-diastolic pressure.  2. Right ventricular systolic function is normal. The right ventricular size is normal. There is mildly elevated pulmonary artery systolic pressure.  3. The mitral valve is normal in structure. No evidence of mitral valve regurgitation. No evidence of mitral stenosis.  4. The aortic valve is normal in structure. Aortic valve regurgitation is not visualized. No aortic stenosis is present.  5. The inferior vena cava is normal in size with greater than 50% respiratory variability, suggesting right atrial pressure of 3 mmHg. FINDINGS  Left Ventricle: Left ventricular ejection fraction, by estimation, is 60 to 65%. The left ventricle has normal function. The left ventricle has no regional wall motion abnormalities. The left ventricular internal cavity size was normal in size. There is  no left ventricular hypertrophy. Left ventricular diastolic parameters are consistent with Grade I diastolic dysfunction (impaired relaxation). Elevated left ventricular end-diastolic pressure. Right Ventricle: The right ventricular size is normal. No increase in right ventricular wall thickness. Right ventricular systolic function is normal.  There is mildly elevated pulmonary artery systolic pressure. The tricuspid regurgitant velocity is 3.04  m/s, and with an assumed right atrial pressure of 3 mmHg, the estimated right ventricular systolic pressure is 45.4 mmHg. Left Atrium: Left atrial size was normal in size. Right Atrium: Right atrial size was normal in size. Pericardium: There is no evidence of pericardial effusion. Mitral Valve: The mitral valve is normal in structure. Normal mobility of the mitral valve leaflets. No evidence of mitral valve regurgitation. No evidence of mitral valve stenosis. Tricuspid Valve: The tricuspid valve is normal in structure. Tricuspid valve regurgitation is mild . No evidence of tricuspid stenosis. Aortic Valve: The aortic valve is normal in structure. Aortic valve regurgitation is not visualized. No aortic stenosis is present. Pulmonic Valve: The pulmonic valve was normal in structure. Pulmonic valve regurgitation is not visualized. No evidence of pulmonic stenosis. Aorta: The aortic root is normal in size and structure. Venous: The inferior vena cava is normal in size with greater than 50% respiratory variability, suggesting right atrial pressure of 3 mmHg. IAS/Shunts: No atrial level shunt detected by color flow Doppler.  LEFT VENTRICLE PLAX 2D LVIDd:         3.80 cm  Diastology LVIDs:         2.30 cm  LV e' lateral:   7.29 cm/s LV PW:         0.80 cm  LV E/e' lateral: 9.5 LV IVS:        0.60 cm  LV e' medial:    4.24 cm/s LVOT diam:     1.80 cm  LV E/e' medial:  16.2 LV SV:         49 LV SV Index:   37 LVOT Area:     2.54 cm  RIGHT VENTRICLE RV S prime:     11.50 cm/s TAPSE (M-mode): 2.1 cm LEFT  ATRIUM           Index      RIGHT ATRIUM          Index LA diam:      2.40 cm 1.78 cm/m RA Area:     8.16 cm LA Vol (A4C): 13.3 ml 9.86 ml/m RA Volume:   13.70 ml 10.16 ml/m  AORTIC VALVE LVOT Vmax:   104.00 cm/s LVOT Vmean:  73.900 cm/s LVOT VTI:    0.194 m  AORTA Ao Root diam: 2.80 cm MITRAL VALVE                 TRICUSPID VALVE MV Area (PHT): 3.46 cm     TR Peak grad:   37.0 mmHg MV Decel Time: 219 msec     TR Vmax:        304.00 cm/s MV E velocity: 68.90 cm/s MV A velocity: 101.00 cm/s  SHUNTS MV E/A ratio:  0.68         Systemic VTI:  0.19 m                             Systemic Diam: 1.80 cm Skeet Latch MD Electronically signed by Skeet Latch MD Signature Date/Time: 08/19/2019/11:21:53 AM    Final    VAS US CAROTID  Result Date: 08/19/2019 Carotid Arterial Duplex Study Other Factors:     ESRD, on dialysis. Limitations        Today's exam was limited due to Dizziness. Orthostasis. Comparison Study:  No prior study on file for comparison. Performing Technologist: Sharion Dove RVS  Examination Guidelines: A complete evaluation includes B-mode imaging, spectral Doppler, color Doppler, and power Doppler as needed of all accessible portions of each vessel. Bilateral testing is considered an integral part of a complete examination. Limited examinations for reoccurring indications may be performed as noted.  Right Carotid Findings: +----------+--------+--------+--------+------------------+------------------+           PSV cm/sEDV cm/sStenosisPlaque DescriptionComments           +----------+--------+--------+--------+------------------+------------------+ CCA Prox  53      14                                intimal thickening +----------+--------+--------+--------+------------------+------------------+ CCA Distal65      21                                intimal thickening +----------+--------+--------+--------+------------------+------------------+ ICA Prox  68      21                                                   +----------+--------+--------+--------+------------------+------------------+ ICA Distal106     41                                                   +----------+--------+--------+--------+------------------+------------------+ ECA       95      12                                                    +----------+--------+--------+--------+------------------+------------------+ +----------+--------+-------+--------+-------------------+  PSV cm/sEDV cmsDescribeArm Pressure (mmHG) +----------+--------+-------+--------+-------------------+ [id number]                                         +----------+--------+-------+--------+-------------------+ +---------+--------+--+--------+--+ VertebralPSV cm/s49EDV cm/s13 +---------+--------+--+--------+--+  Left Carotid Findings: +----------+--------+--------+--------+------------------+------------------+           PSV cm/sEDV cm/sStenosisPlaque DescriptionComments           +----------+--------+--------+--------+------------------+------------------+ CCA Prox  55      18                                intimal thickening +----------+--------+--------+--------+------------------+------------------+ CCA Distal62      21                                intimal thickening +----------+--------+--------+--------+------------------+------------------+ ICA Prox  73      24                                                   +----------+--------+--------+--------+------------------+------------------+ ICA Distal87      31                                tortuous           +----------+--------+--------+--------+------------------+------------------+ ECA       62      7                                                    +----------+--------+--------+--------+------------------+------------------+ +----------+--------+--------+--------+-------------------+           PSV cm/sEDV cm/sDescribeArm Pressure (mmHG) +----------+--------+--------+--------+-------------------+ [id number]                                          +----------+--------+--------+--------+-------------------+ +---------+--------+--+--------+--+ VertebralPSV cm/s27EDV cm/s10  +---------+--------+--+--------+--+   Summary: Right Carotid: The extracranial vessels were near-normal with only minimal wall                thickening or plaque. Left Carotid: The extracranial vessels were near-normal with only minimal wall               thickening or plaque. Vertebrals:  Bilateral vertebral arteries demonstrate antegrade flow. Subclavians: Normal flow hemodynamics were seen in bilateral subclavian              arteries. *See table(s) above for measurements and observations.  Electronically signed by Antony Contras MD on 08/19/2019 at 1:58:43 PM.    Final       The results of significant diagnostics from this hospitalization (including imaging, microbiology, ancillary and laboratory) are listed below for reference.     Microbiology: Recent Results (from the past 240 hour(s))  SARS CORONAVIRUS 2 (TAT 6-24 HRS) Nasopharyngeal Nasopharyngeal Swab     Status: None   Collection Time: 08/18/19  7:26 PM   Specimen: Nasopharyngeal Swab  Result Value Ref Range Status  SARS Coronavirus 2 NEGATIVE NEGATIVE Final    Comment: (NOTE) SARS-CoV-2 target nucleic acids are NOT DETECTED. The SARS-CoV-2 RNA is generally detectable in upper and lower respiratory specimens during the acute phase of infection. Negative results do not preclude SARS-CoV-2 infection, do not rule out co-infections with other pathogens, and should not be used as the sole basis for treatment or other patient management decisions. Negative results must be combined with clinical observations, patient history, and epidemiological information. The expected result is Negative. Fact Sheet for Patients: SugarRoll.be Fact Sheet for Healthcare Providers: https://www.woods-mathews.com/ This test is not yet approved or cleared by the [place] FDA and  has been authorized for detection and/or diagnosis of SARS-CoV-2 by FDA under an Emergency Use Authorization (EUA). This EUA will  remain  in effect (meaning this test can be used) for the duration of the COVID-19 declaration under Section 56 4(b)(1) of the Act, 21 U.S.C. section 360bbb-3(b)(1), unless the authorization is terminated or revoked sooner. Performed at [facility], [place] [address], [place], [place] [address]      Labs: BNP (last 3 results) Recent Labs    06/25/19 1740 06/28/19 1753  BNP 38.4 42.5   Basic Metabolic Panel: Recent Labs  Lab 08/18/19 1514 08/19/19 0539 08/20/19 0517 08/21/19 0222  NA 138 138 136 136  K 3.2* 3.8 3.6 3.8  CL 95* 98 102 100  CO2 27 23 24 24   GLUCOSE 96 86 95 103*  BUN 42* 42* 37* 32*  CREATININE 10.54* 10.57* 8.56* 7.68*  CALCIUM 9.3 8.9 8.3* 8.8*  MG  --  1.9  --   --   PHOS  --   --  4.6 3.7   Liver Function Tests: Recent Labs  Lab 08/18/19 1514 08/20/19 0517 08/21/19 0222  AST 30  --   --   ALT 26  --   --   ALKPHOS 67  --   --   BILITOT 0.8  --   --   PROT 7.4  --   --   ALBUMIN 3.7 2.5* 2.6*   No results for input(s): LIPASE, AMYLASE in the last 168 hours. No results for input(s): AMMONIA in the last 168 hours. CBC: Recent Labs  Lab 08/18/19 1514 08/19/19 0539 08/20/19 0517 08/21/19 0222  WBC 6.7 6.6 5.5 5.8  NEUTROABS 4.4  --   --   --   HGB 12.3 10.4* 9.1* 9.5*  HCT 36.6 31.2* 27.9* 28.2*  MCV 95.8 96.0 96.9 94.0  PLT 265 255 208 247   Cardiac Enzymes: No results for input(s): CKTOTAL, CKMB, CKMBINDEX, TROPONINI in the last 168 hours. BNP: Invalid input(s): POCBNP CBG: No results for input(s): GLUCAP in the last 168 hours. D-Dimer Recent Labs    08/18/19 1514  DDIMER 0.27   Hgb A1c No results for input(s): HGBA1C in the last 72 hours. Lipid Profile No results for input(s): CHOL, HDL, LDLCALC, TRIG, CHOLHDL, LDLDIRECT in the last 72 hours. Thyroid function studies No results for input(s): TSH, T4TOTAL, T3FREE, THYROIDAB in the last 72 hours.  Invalid input(s): FREET3 Anemia work up No results for input(s):  VITAMINB12, FOLATE, FERRITIN, TIBC, IRON, RETICCTPCT in the last 72 hours. Urinalysis    Component Value Date/Time   COLORURINE YELLOW 06/25/2019 2050   APPEARANCEUR HAZY (A) 06/25/2019 2050   LABSPEC 1.016 06/25/2019 2050   PHURINE 5.0 06/25/2019 2050   GLUCOSEU NEGATIVE 06/25/2019 2050   GLUCOSEU NEGATIVE 06/03/2014 1038   HGBUR NEGATIVE 06/25/2019 2050   BILIRUBINUR  NEGATIVE 06/25/2019 2050   [place] 06/25/2019 2050   PROTEINUR NEGATIVE 06/25/2019 2050   UROBILINOGEN 0.2 06/03/2014 1038   NITRITE NEGATIVE 06/25/2019 2050   LEUKOCYTESUR TRACE (A) 06/25/2019 2050   Sepsis Labs Invalid input(s): PROCALCITONIN,  WBC,  LACTICIDVEN Microbiology Recent Results (from the past 240 hour(s))  SARS CORONAVIRUS 2 (TAT 6-24 HRS) Nasopharyngeal Nasopharyngeal Swab     Status: None   Collection Time: 08/18/19  7:26 PM   Specimen: Nasopharyngeal Swab  Result Value Ref Range Status   SARS Coronavirus 2 NEGATIVE NEGATIVE Final    Comment: (NOTE) SARS-CoV-2 target nucleic acids are NOT DETECTED. The SARS-CoV-2 RNA is generally detectable in upper and lower respiratory specimens during the acute phase of infection. Negative results do not preclude SARS-CoV-2 infection, do not rule out co-infections with other pathogens, and should not be used as the sole basis for treatment or other patient management decisions. Negative results must be combined with clinical observations, patient history, and epidemiological information. The expected result is Negative. Fact Sheet for Patients: SugarRoll.be Fact Sheet for Healthcare Providers: https://www.woods-mathews.com/ This test is not yet approved or cleared by the [place] FDA and  has been authorized for detection and/or diagnosis of SARS-CoV-2 by FDA under an Emergency Use Authorization (EUA). This EUA will remain  in effect (meaning this test can be used) for the duration of the COVID-19  declaration under Section 56 4(b)(1) of the Act, 21 U.S.C. section 360bbb-3(b)(1), unless the authorization is terminated or revoked sooner. Performed at [facility], [place] [address], [place], [place] [address]      Time coordinating discharge:  I have spent 35 minutes face to face with the patient and on the ward discussing the patients care, assessment, plan and disposition with other care givers. >50% of the time was devoted counseling the patient about the risks and benefits of treatment/Discharge disposition and coordinating care.   SIGNED:   Damita Lack, MD  [facility] 08/21/2019, 1:29 PM   If 7PM-7AM, please contact night-coverage

## 2019-08-21 NOTE — Plan of Care (Signed)
  Problem: Activity: Goal: Risk for activity intolerance will decrease Outcome: Progressing   

## 2019-09-18 ENCOUNTER — Telehealth: Payer: Self-pay | Admitting: Internal Medicine

## 2019-09-20 NOTE — Telephone Encounter (Signed)
error 

## 2019-09-23 ENCOUNTER — Institutional Professional Consult (permissible substitution): Payer: Medicare HMO | Admitting: Internal Medicine

## 2019-10-16 ENCOUNTER — Encounter: Payer: Self-pay | Admitting: Critical Care Medicine

## 2019-10-16 ENCOUNTER — Other Ambulatory Visit: Payer: Self-pay

## 2019-10-16 ENCOUNTER — Ambulatory Visit: Payer: Medicare HMO | Admitting: Critical Care Medicine

## 2019-10-16 VITALS — BP 130/58 | HR 78 | Temp 98.8°F | Ht 62.99 in | Wt 108.2 lb

## 2019-10-16 DIAGNOSIS — Z8616 Personal history of COVID-19: Secondary | ICD-10-CM

## 2019-10-16 NOTE — Patient Instructions (Addendum)
Thank you for visiting Dr. Carlis Abbott at [facility]. We recommend the following: Orders Placed This Encounter  Procedures  . Pulmonary function test   Orders Placed This Encounter  Procedures  . Pulmonary function test    Standing Status:   Future    Standing Expiration Date:   10/15/2020    Order Specific Question:   Where should this test be performed?    Answer:   [facility]    Order Specific Question:   Full PFT: includes the following: basic spirometry, spirometry pre & post bronchodilator, diffusion capacity (DLCO), lung volumes    Answer:   Full PFT      Return in about 2 months (around 12/16/2019). with Tammy Parrett.    Please do your part to reduce the spread of COVID-19.

## 2019-10-16 NOTE — Progress Notes (Signed)
Synopsis: Referred in June 2021 for post-covid by [name], [name]. Previously a patient of Dr. Elsworth [name].  Subjective:   PATIENT ID: [name] GENDER: female DOB: 07-07-1945, MRN: [id number]  Chief Complaint  Patient presents with  . 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  She previously an open lung biopsy in 2010 demonstrating vasculitis.  She is not currently on immunomodulating therapy.  She has previously been treated with cyclophosphamide the past.  She was previously seen by pulmonology, but has not been seen in clinic in 3 years.  She presents today for evaluation after recent admission in April 2021 with a cluster symptoms felt to be due to possible Covid.  Her PCR test was negative but she had positive antibodies.  She had shortness of breath from January to April, which is slowly improving.  She lost 30 to 40 pounds during that time due to change in appetite, but her appetite is slowly improving and she is starting to gain back weight.  Her sinus symptoms are at baseline due to chronic allergies.  She occasionally still has epistaxis, but this has been chronic for her.  No chest pain, leg edema.  She is not having issues with her peritoneal dialysis.  When she was discharged from the hospital she was started on Breo, but she has not needed this in the past several weeks.  She denies cough and shortness of breath currently.  She has a minimal previous smoking history prior to quitting in 1986.     Past Medical History:  Diagnosis Date  . Acute kidney injury ([place]) 07/20/2016  . Allergic rhinitis   . Anxiety   . CAP (community acquired pneumonia) 06/2016   [name] 06/19/2016  . Cervical disc disease    [name]  07/20/2016  . Chest pain at rest 06/18/2012  . COPD (chronic obstructive pulmonary disease) ([place]) 05/23/2012  . Diverticulosis   . DJD (degenerative joint disease)   . Esophageal diverticulum   . Esophageal stricture   . GERD (gastroesophageal reflux disease)   . Hiatal hernia   . History of blood transfusion 06/2016   "when I was hospitalized w/sepsis"  . Hyperlipidemia 05/23/2012   [name] 234, LDL 130   . Hypertension   . IBS (irritable bowel syndrome)   . Lung nodules    hx  with extensive workup including lung biopsy ruling out Sjogren's disease/notes 07/20/2016  . Osteopenia   . Recurrent sinusitis    [name] 07/20/2016  . Renal insufficiency   . Sinus headache    "daily lately" (07/20/2016)  . Traumatic arthritis      Family History  Problem Relation Age of Onset  . Throat cancer Father   . Hypertension Sister   . Thyroid disease Daughter   . Hypertension Sister        x 2  . Breast cancer Maternal Aunt   . Blindness Sister        legally blind  . Colon cancer Neg Hx   . Stomach cancer Neg Hx      Past Surgical History:  Procedure Laterality Date  . ANTERIOR CERVICAL DECOMP/DISCECTOMY FUSION  2005  . BACK SURGERY    . COLONOSCOPY  2001   [name] 09/21/2010  .  CYSTECTOMY Bilateral    hx/notes 09/21/2010  . IR GENERIC HISTORICAL  07/20/2016   IR FLUORO GUIDE CV LINE RIGHT 07/20/2016 [name], [name]  . IR GENERIC HISTORICAL  07/20/2016   IR US GUIDE VASC ACCESS RIGHT 07/20/2016 [name], [name]  . LUNG BIOPSY Left    left side, not cancerous, thoracotomy  . TONSILLECTOMY AND ADENOIDECTOMY     hx/notes 09/21/2010  . VAGINAL HYSTERECTOMY     hx w/oophorectomy/notes 09/21/2010    Social History   Socioeconomic History  . Marital status: Divorced    Spouse name: Not on file  . Number of children: 2  . Years of education: 31  . Highest education level: Not on file  Occupational History  . Occupation: Retired [name]  Tobacco Use  .  Smoking status: Former Smoker    Packs/day: 0.12    Years: 8.00    Pack years: 0.96    Types: Cigarettes    Quit date: 05/04/1985    Years since quitting: 34.4  . Smokeless tobacco: Never Used  Substance and Sexual Activity  . Alcohol use: Yes    Alcohol/week: 1.0 standard drinks    Types: 1 Cans of beer per week  . Drug use: No  . Sexual activity: Not Currently  Other Topics Concern  . Not on file  Social History Narrative   Lives alone   Caffeine -tea, 1 cup daily;  Pepsi maybe one a day   Social Determinants of Health   Financial Resource Strain:   . Difficulty of Paying Living Expenses:   Food Insecurity:   . Worried About Charity fundraiser in the Last Year:   . Arboriculturist in the Last Year:   Transportation Needs:   . Film/video editor (Medical):   [name] Lack of Transportation (Non-Medical):   Physical Activity:   . Days of Exercise per Week:   . Minutes of Exercise per Session:   Stress:   . Feeling of Stress :   Social Connections:   . Frequency of Communication with Friends and Family:   . Frequency of Social Gatherings with Friends and Family:   . Attends Religious Services:   . Active Member of Clubs or Organizations:   . Attends Archivist Meetings:   [name] Marital Status:   Intimate Partner Violence:   . Fear of Current or Ex-Partner:   . Emotionally Abused:   [name] Physically Abused:   . Sexually Abused:      Allergies  Allergen Reactions  . Ace Inhibitors Anaphylaxis, Swelling and Other (See Comments)    Angioedema  . Calcium Channel Blockers Anaphylaxis, Itching, Rash and Other (See Comments)    Red rash, Knot (sore and itches), and a fever for a couple days (per pt report)  . Diltiazem Anaphylaxis  . Other Anaphylaxis, Itching, Rash and Other (See Comments)    Calcium Channel Blocking Agent Diltiazem Analogues: Red rash, Knot (sore and itches), and a fever for a couple days (per pt report)  . Pneumococcal Vaccines Other (See  Comments)    Red rash, Knot (sore and itches), A fever for a couple days (per pt report)  . Tetanus Toxoid Swelling and Other (See Comments)    FEVER and cellulitis in an arm    . Latex Rash    Reaction to gloves  . Tape Rash and Other (See Comments)    PLEASE USE PAPER TAPE!!     Immunization History  Administered Date(s) Administered  . Influenza Split 06/19/2012  . Influenza Whole 03/09/2009  . Influenza,inj,Quad PF,6+ Mos 05/28/2013, 06/03/2014  . Influenza-Unspecified 01/07/2018  . Pneumococcal Conjugate-13 06/19/2014  . Pneumococcal Polysaccharide-23 05/23/2012    Outpatient Medications Prior to Visit  Medication Sig Dispense Refill  . acetaminophen (TYLENOL) 500 MG tablet Take 1,000 mg by mouth every 6 (six) hours as needed (for headaches or pain).     [name] aspirin EC 81 MG tablet Take 81 mg by mouth daily.    [name] atorvastatin (LIPITOR) 10 MG tablet Take 1 tablet (10 mg total) by mouth daily. 90 tablet 3  . BREO ELLIPTA 100-25 MCG/INH AEPB Inhale 1 puff into the lungs daily.     . calcitRIOL (ROCALTROL) 0.25 MCG capsule Take 0.25 mcg by mouth daily.     . Cholecalciferol (VITAMIN D3) 50 MCG (2000 UT) TABS Take 4,000 Units by mouth daily.    [name] COLACE 100 MG capsule Take 100 mg by mouth daily as needed for mild constipation.     . ferric citrate (AURYXIA) 1 GM 210 MG(Fe) tablet Take 210 mg by mouth 3 (three) times daily with meals.    . lactulose (CHRONULAC) 10 GM/15ML solution Take 10 g by mouth daily as needed for mild constipation.    [name] loratadine-pseudoephedrine (CLARITIN-D 12 HOUR) 5-120 MG tablet Take 1 tablet by mouth every 12 (twelve) hours.    . Melatonin 5 MG TABS Take 5 mg by mouth at bedtime as needed (for sleep).     . midodrine (PROAMATINE) 5 MG tablet Take 1 tablet (5 mg total) by mouth 3 (three) times daily with meals. 90 tablet 0  . multivitamin (RENA-VIT) TABS tablet Take 1 tablet by mouth daily.    . pantoprazole (PROTONIX) 40 MG tablet Take 1 tablet (40 mg  total) by mouth daily. 90 tablet 3  . Potassium Chloride ER 20 MEQ TBCR Take 20 mEq by mouth daily.     . sodium chloride (OCEAN) 0.65 % SOLN nasal spray Place 2 sprays into both nostrils daily as needed for congestion.     . torsemide (DEMADEX) 20 MG tablet Take 20 mg by mouth daily.      No facility-administered medications prior to visit.    Review of Systems  Constitutional: Negative for chills and fever.       Previous weight loss-now regaining  HENT: Positive for congestion and nosebleeds.   Respiratory: Negative for cough and shortness of breath.   Cardiovascular: Positive for leg swelling. Negative for chest pain.  Musculoskeletal: Negative for joint pain and neck pain.  Skin: Negative for rash.     Objective:   Vitals:   10/16/19 1549  BP: (!) 130/58  Pulse: 78  Temp: 98.8 F (37.1 C)  TempSrc: Oral  SpO2: 98%  Weight: 108 lb 3.2 oz (49.1 kg)  Height: 5' 2.99" (1.6 m)   98% on  RA BMI Readings from Last 3 Encounters:  10/16/19 19.17 kg/m  08/21/19 16.94 kg/m  06/28/19 19.14 kg/m   Wt Readings from Last 3 Encounters:  10/16/19 108 lb 3.2 oz (49.1 kg)  08/21/19 92 lb 9.5 oz (42 kg)  06/28/19 98 lb (44.5 kg)    Physical Exam Vitals reviewed.  Constitutional:      Appearance: She is not ill-appearing.     Comments: Thin elderly woman in no acute distress  HENT:     Head: Normocephalic and atraumatic.  Eyes:     General: No scleral icterus. Cardiovascular:  Rate and Rhythm: Normal rate and regular rhythm.  Pulmonary:     Comments: Breathing comfortably on room air, no conversational dyspnea.  Minimal scattered end basilar rales.  No wheezing.  Speaking in full sentences. Abdominal:     General: There is no distension.     Palpations: Abdomen is soft.     Tenderness: There is no abdominal tenderness.  Musculoskeletal:        General: No deformity.     Cervical back: Neck supple.     Comments: Mild symmetric dependent lower extremity edema   Lymphadenopathy:     Cervical: No cervical adenopathy.  Skin:    General: Skin is warm and dry.     Findings: No rash.  Neurological:     General: No focal deficit present.     Mental Status: She is alert.     Coordination: Coordination normal.  Psychiatric:        Mood and Affect: Mood normal.        Behavior: Behavior normal.      CBC    Component Value Date/Time   WBC 5.8 08/21/2019 0222   RBC 3.00 (L) 08/21/2019 0222   HGB 9.5 (L) 08/21/2019 0222   HCT 28.2 (L) 08/21/2019 0222   PLT 247 08/21/2019 0222   MCV 94.0 08/21/2019 0222   MCH 31.7 08/21/2019 0222   MCHC 33.7 08/21/2019 0222   RDW 13.7 08/21/2019 0222   LYMPHSABS 1.0 08/18/2019 1514   MONOABS 0.7 08/18/2019 1514   EOSABS 0.4 08/18/2019 1514   BASOSABS 0.1 08/18/2019 1514    CHEMISTRY No results for input(s): NA, K, CL, CO2, GLUCOSE, BUN, CREATININE, CALCIUM, MG, PHOS in the last 168 hours. CrCl cannot be calculated (Patient's most recent lab result is older than the maximum 21 days allowed.).   Chest Imaging- films reviewed: CT chest 07/03/2019-biapical scarring with subpleural cysts and areas of fibrosis.  Right upper lobe irregularly-shaped nodule.  Calcified superior segment left lower lobe nodules.  No significant mediastinal or hilar adenopathy.  Enlarged main pulmonary artery.  Compared to 2018 CT scan, previous areas of abnormality appear smaller, similar to scars.  No new areas of disease.   Pulmonary Functions Testing Results: No flowsheet data found.  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  70% circumferential cellular crescent formation with underlying tuft fibrinoid necrosis.  Mild arteriosclerosis with mild to focally moderate tubulointerstitial scarring and severe superimposed acute tubular injury  11/06/2008 lung biopsy discrete nodules with prominent background collagen type  scarring containing numerous plasma cells and lymphoid follicles with reactive germinal centers, mostly in the peribronchiolar parenchyma.  Mostly chronic inflammation, inflammatory pseudotumor.  Possibly due to Sjogren's syndrome.   Echocardiogram 08/19/2019: LVEF 60 to 03%, grade 1 diastolic dysfunction with elevated LVEDP.  Normal RV size and function, mildly elevated PASP.  Normal LA, RA.  Normal valves.      Assessment & Plan:     ICD-10-CM   1. History of COVID-19  Z86.16 Pulmonary function test    Referred for post Covid symptoms of shortness of breath.  She has a history of previous ANCA vasculitis in the lungs, which has been quiesced sent recently.  Previously on cyclophosphamide.  End-stage renal disease due to vasculitis. -Okay to continue to hold Breo. -PFTs -Glad that she is feeling better.  Encouraged ongoing physical activity. -Maintain euvolemia  Weight loss -Encourage p.o. intake and regaining the weight loss.  If she continues  losing weight, would evaluate with inflammatory markers for recurrent vasculitis, but her currently well controlled symptoms suggest against this  Pulmonary hypertension; unclear etiology.  Possibly WHO group 2 versus 3.  Very infrequently described as a complication of ANCA vasculitis. -Would monitor systemic symptoms and for recurrent symptoms of pulmonary hypertension prior to pursuing aggressive work-up of pulmonary hypertension.  It is unclear if she would be a candidate for treatment.  -If recurrent shortness of breath, would pursue right heart catheterization to better understand hemodynamics.  RTC in 2 months after PFTs.   > 1h spent on this encounter including extensive record review, time spent face-to-face with the patient, and charting.   Current Outpatient Medications:  .  acetaminophen (TYLENOL) 500 MG tablet, Take 1,000 mg by mouth every 6 (six) hours as needed (for headaches or pain). , Disp: , Rfl:  .  aspirin EC 81 MG  tablet, Take 81 mg by mouth daily., Disp: , Rfl:  .  atorvastatin (LIPITOR) 10 MG tablet, Take 1 tablet (10 mg total) by mouth daily., Disp: 90 tablet, Rfl: 3 .  BREO ELLIPTA 100-25 MCG/INH AEPB, Inhale 1 puff into the lungs daily. , Disp: , Rfl:  .  calcitRIOL (ROCALTROL) 0.25 MCG capsule, Take 0.25 mcg by mouth daily. , Disp: , Rfl:  .  Cholecalciferol (VITAMIN D3) 50 MCG (2000 UT) TABS, Take 4,000 Units by mouth daily., Disp: , Rfl:  .  COLACE 100 MG capsule, Take 100 mg by mouth daily as needed for mild constipation. , Disp: , Rfl:  .  ferric citrate (AURYXIA) 1 GM 210 MG(Fe) tablet, Take 210 mg by mouth 3 (three) times daily with meals., Disp: , Rfl:  .  lactulose (CHRONULAC) 10 GM/15ML solution, Take 10 g by mouth daily as needed for mild constipation., Disp: , Rfl:  .  loratadine-pseudoephedrine (CLARITIN-D 12 HOUR) 5-120 MG tablet, Take 1 tablet by mouth every 12 (twelve) hours., Disp: , Rfl:  .  Melatonin 5 MG TABS, Take 5 mg by mouth at bedtime as needed (for sleep). , Disp: , Rfl:  .  midodrine (PROAMATINE) 5 MG tablet, Take 1 tablet (5 mg total) by mouth 3 (three) times daily with meals., Disp: 90 tablet, Rfl: 0 .  multivitamin (RENA-VIT) TABS tablet, Take 1 tablet by mouth daily., Disp: , Rfl:  .  pantoprazole (PROTONIX) 40 MG tablet, Take 1 tablet (40 mg total) by mouth daily., Disp: 90 tablet, Rfl: 3 .  Potassium Chloride ER 20 MEQ TBCR, Take 20 mEq by mouth daily. , Disp: , Rfl:  .  sodium chloride (OCEAN) 0.65 % SOLN nasal spray, Place 2 sprays into both nostrils daily as needed for congestion. , Disp: , Rfl:  .  torsemide (DEMADEX) 20 MG tablet, Take 20 mg by mouth daily. , Disp: , Rfl:     Julian Hy, DO [facility] 10/16/2019 4:15 PM

## 2019-12-16 ENCOUNTER — Other Ambulatory Visit: Payer: Self-pay

## 2019-12-16 ENCOUNTER — Ambulatory Visit (INDEPENDENT_AMBULATORY_CARE_PROVIDER_SITE_OTHER): Payer: Medicare HMO

## 2019-12-16 ENCOUNTER — Ambulatory Visit: Payer: Medicare HMO

## 2019-12-16 ENCOUNTER — Ambulatory Visit: Payer: Medicare HMO | Admitting: Adult Health

## 2019-12-16 ENCOUNTER — Encounter: Payer: Self-pay | Admitting: Adult Health

## 2019-12-16 VITALS — BP 114/62 | HR 78 | Temp 97.4°F | Ht 63.0 in | Wt 103.2 lb

## 2019-12-16 DIAGNOSIS — Z8616 Personal history of COVID-19: Secondary | ICD-10-CM

## 2019-12-16 DIAGNOSIS — J432 Centrilobular emphysema: Secondary | ICD-10-CM

## 2019-12-16 LAB — PULMONARY FUNCTION TEST
DL/VA % pred: 100 %
DL/VA: 4.16 ml/min/mmHg/L
DLCO cor % pred: 71 %
DLCO cor: 13.22 ml/min/mmHg
DLCO unc % pred: 71 %
DLCO unc: 13.22 ml/min/mmHg
FEF 25-75 Post: 1.52 L/sec
FEF 25-75 Pre: 1.38 L/sec
FEF2575-%Change-Post: 9 %
FEF2575-%Pred-Post: 108 %
FEF2575-%Pred-Pre: 99 %
FEV1-%Change-Post: 4 %
FEV1-%Pred-Post: 87 %
FEV1-%Pred-Pre: 84 %
FEV1-Post: 1.39 L
FEV1-Pre: 1.33 L
FEV1FVC-%Change-Post: 9 %
FEV1FVC-%Pred-Pre: 117 %
FEV6-%Change-Post: -4 %
FEV6-%Pred-Post: 72 %
FEV6-%Pred-Pre: 75 %
FEV6-Post: 1.42 L
FEV6-Pre: 1.49 L
FEV6FVC-%Pred-Post: 104 %
FEV6FVC-%Pred-Pre: 104 %
FVC-%Change-Post: -4 %
FVC-%Pred-Post: 69 %
FVC-%Pred-Pre: 72 %
FVC-Post: 1.42 L
FVC-Pre: 1.49 L
Post FEV1/FVC ratio: 98 %
Post FEV6/FVC ratio: 100 %
Pre FEV1/FVC ratio: 89 %
Pre FEV6/FVC Ratio: 100 %
RV % pred: 90 %
RV: 2.05 L
TLC % pred: 76 %
TLC: 3.76 L

## 2019-12-16 MED ORDER — ALBUTEROL SULFATE HFA 108 (90 BASE) MCG/ACT IN AERS: 1.0000 | INHALATION_SPRAY | Freq: Four times a day (QID) | RESPIRATORY_TRACT | 2 refills | Status: AC | PRN

## 2019-12-16 NOTE — Progress Notes (Signed)
@Patient  ID: [name], female    DOB: 1945-08-19, 75 y.o.   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  Previous open lung biopsy in 2010 demonstrating vasculitis.  (Not currently on Immodulators-previously on cyclophosphamide followed at [facility].)  TEST/EVENTS :  CT chest 07/03/2019-biapical scarring with subpleural cysts and areas of fibrosis.  Right upper lobe irregularly-shaped nodule.  Calcified superior segment left lower lobe nodules.  No significant mediastinal or hilar adenopathy.  Enlarged main pulmonary artery.  Compared to 2018 CT scan, previous areas of abnormality appear smaller, similar to scars.  No new areas of disease.  PET scan that was done on 09/12/2016 that showed no abnormal hypermetabolism in the neck, chest, abdomen or pelvis. There was multifocal scarring and architectural distortion in the lungs, unchanged from 2010. 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  70% circumferential cellular crescent formation with underlying tuft fibrinoid necrosis.  Mild arteriosclerosis with mild to focally moderate tubulointerstitial scarring and severe superimposed acute tubular injury  11/06/2008 lung biopsy discrete nodules with prominent background collagen type scarring containing numerous plasma cells and lymphoid follicles with reactive germinal centers, mostly in the peribronchiolar parenchyma.  Mostly chronic inflammation, inflammatory pseudotumor.  Possibly due to Sjogren's syndrome.   Echocardiogram 08/19/2019: LVEF 60 to 71%, grade 1 diastolic dysfunction with elevated LVEDP.  Normal RV size and  function, mildly elevated PASP.  Normal LA, RA.  Normal valves.  12/16/2019 Follow up : Dyspnea  Patient presents for a 29-month follow-up.  Patient was seen last visit for a evaluation for shortness of breath.  She had recently been admitted to the hospital for a constellation of symptoms with shortness of breath weight loss.  Patient's COVID-19 test was negative but antibody test was positive so this is very suspicious that she had had a recent infection.  At last visit patient was starting to improve with resolved shortness of breath and cough.   Patient is a former smoker.  Had pulmonary function testing done today that showed mild restriction with an FEV1 at 84%, ratio 89 FVC 72%.  No significant bronchodilator response.  DLCO was 71%.  Since last visit patient is feeling about the same.  Denies any significant dyspnea or cough.  Says that she has chronically low energy.  He says her peritoneal dialysis is going okay.  She does plan to be seen at [facility] soon to discuss possible kidney transplant. She says she does notice that if she takes in a very deep breath that she may have just a slight pain at the lower part of her back but this seems to be.  Was placed on Breo from hospitalization.  Says she has not taken in a while.  Previous CT scan showed some emphysema.    Allergies  Allergen Reactions   Ace Inhibitors Anaphylaxis, Swelling and Other (See Comments)    Angioedema   Calcium Channel Blockers Anaphylaxis, Itching, Rash and Other (See Comments)    Red rash, Knot (sore and itches), and a fever for a couple days (per pt report)   Diltiazem Anaphylaxis   Other Anaphylaxis, Itching, Rash and Other (See Comments)    Calcium Channel Blocking Agent Diltiazem Analogues: Red rash,  Knot (sore and itches), and a fever for a couple days (per pt report)   Pneumococcal Vaccines Other (See Comments)    Red rash, Knot (sore and itches), A fever for a couple days (per pt report)   Tetanus  Toxoid Swelling and Other (See Comments)    FEVER and cellulitis in an arm     Latex Rash    Reaction to gloves   Tape Rash and Other (See Comments)    PLEASE USE PAPER TAPE!!    Immunization History  Administered Date(s) Administered   Influenza Split 06/19/2012   Influenza Whole 03/09/2009   Influenza, High Dose Seasonal PF 01/24/2019   Influenza,inj,Quad PF,6+ Mos 05/28/2013, 06/03/2014   Influenza-Unspecified 06/19/2012, 05/28/2013, 06/03/2014, 12/19/2016, 01/07/2018, 01/22/2018   PFIZER SARS-COV-2 Vaccination 07/20/2019, 08/15/2019   Pneumococcal Conjugate-13 06/19/2014   Pneumococcal Polysaccharide-23 05/23/2012, 05/29/2017    Past Medical History:  Diagnosis Date   Acute kidney injury ([place]) 07/20/2016   Allergic rhinitis    Anxiety    CAP (community acquired pneumonia) 06/2016   [name] 06/19/2016   Cervical disc disease    /notes 07/20/2016   Chest pain at rest 06/18/2012   COPD (chronic obstructive pulmonary disease) ([place]) 05/23/2012   Diverticulosis    DJD (degenerative joint disease)    Esophageal diverticulum    Esophageal stricture    ESRD (end stage renal disease) ([place])    GERD (gastroesophageal reflux disease)    Hiatal hernia    History of blood transfusion 06/2016   "when I was hospitalized w/sepsis"   Hyperlipidemia 05/23/2012   T. Chol 234, LDL 130    Hypertension    IBS (irritable bowel syndrome)    Lung nodules    hx  with extensive workup including lung biopsy ruling out Sjogren's disease/notes 07/20/2016   Osteopenia    Recurrent sinusitis    /noters 07/20/2016   Renal insufficiency    Sinus headache    "daily lately" (07/20/2016)   Traumatic arthritis     Tobacco History: Social History   Tobacco Use  Smoking Status Former Smoker   Packs/day: 0.12   Years: 8.00   Pack years: 0.96   Types: Cigarettes   Quit date: 05/04/1985   Years since quitting: 34.6  Smokeless Tobacco Never Used    Counseling given: Not Answered   Outpatient Medications Prior to Visit  Medication Sig Dispense Refill   acetaminophen (TYLENOL) 500 MG tablet Take 1,000 mg by mouth every 6 (six) hours as needed (for headaches or pain).      aspirin EC 81 MG tablet Take 81 mg by mouth daily.     atorvastatin (LIPITOR) 10 MG tablet Take 1 tablet (10 mg total) by mouth daily. 90 tablet 3   BREO ELLIPTA 100-25 MCG/INH AEPB Inhale 1 puff into the lungs daily.      calcitRIOL (ROCALTROL) 0.25 MCG capsule Take 0.25 mcg by mouth daily.      Cholecalciferol (VITAMIN D3) 50 MCG (2000 UT) TABS Take 4,000 Units by mouth daily.     COLACE 100 MG capsule Take 100 mg by mouth daily as needed for mild constipation.      ferric citrate (AURYXIA) 1 GM 210 MG(Fe) tablet Take 210 mg by mouth 3 (three) times daily with meals.     lactulose (CHRONULAC) 10 GM/15ML solution Take 10 g by mouth daily as needed for mild constipation.     loratadine-pseudoephedrine (CLARITIN-D 12 HOUR) 5-120 MG tablet Take 1 tablet by mouth every 12 (twelve) hours.  Melatonin 5 MG TABS Take 5 mg by mouth at bedtime as needed (for sleep).      midodrine (PROAMATINE) 5 MG tablet Take 1 tablet (5 mg total) by mouth 3 (three) times daily with meals. 90 tablet 0   multivitamin (RENA-VIT) TABS tablet Take 1 tablet by mouth daily.     pantoprazole (PROTONIX) 40 MG tablet Take 1 tablet (40 mg total) by mouth daily. 90 tablet 3   Potassium Chloride ER 20 MEQ TBCR Take 20 mEq by mouth daily.      sodium chloride (OCEAN) 0.65 % SOLN nasal spray Place 2 sprays into both nostrils daily as needed for congestion.      torsemide (DEMADEX) 20 MG tablet Take 20 mg by mouth daily.      No facility-administered medications prior to visit.     Review of Systems:   Constitutional:   No  weight loss, night sweats,  Fevers, chills, + fatigue, or  lassitude.  HEENT:   No headaches,  Difficulty swallowing,  Tooth/dental problems, or  Sore  throat,                No sneezing, itching, ear ache, nasal congestion, post nasal drip,   CV:  No chest pain,  Orthopnea, PND, swelling in lower extremities, anasarca, dizziness, palpitations, syncope.   GI  No heartburn, indigestion, abdominal pain, nausea, vomiting, diarrhea, change in bowel habits, loss of appetite, bloody stools.   Resp:   No chest wall deformity  Skin: no rash or lesions.  GU: no dysuria, change in color of urine, no urgency or frequency.  No flank pain, no hematuria   MS:  No joint pain or swelling.  No decreased range of motion.  No back pain.    Physical Exam  BP 114/62    Pulse 78    Temp (!) 97.4 F (36.3 C) (Oral)    Ht 5\' 3"  (1.6 m)    Wt 103 lb 3.2 oz (46.8 kg)    SpO2 98%    BMI 18.28 kg/m   GEN: A/Ox3; pleasant , NAD, well nourished    HEENT:  [place]/AT,  NOSE-clear, THROAT-clear, no lesions, no postnasal drip or exudate noted.   NECK:  Supple w/ fair ROM; no JVD; normal carotid impulses w/o bruits; no thyromegaly or nodules palpated; no lymphadenopathy.    RESP  Clear  P & A; w/o, wheezes/ rales/ or rhonchi. no accessory muscle use, no dullness to percussion  CARD:  RRR, no m/r/g, no peripheral edema, pulses intact, no cyanosis or clubbing.  GI:   Soft & nt; nml bowel sounds; no organomegaly or masses detected.   Musco: Warm bil, no deformities or joint swelling noted.   Neuro: alert, no focal deficits noted.    Skin: Warm, no lesions or rashes    Lab Results:   BMET   BNP  ProBNP No results found for: PROBNP  Imaging: No results found.    PFT Results Latest Ref Rng & Units 12/16/2019  FVC-Pre L 1.49  FVC-Predicted Pre % 72  FVC-Post L 1.42  FVC-Predicted Post % 69  Pre FEV1/FVC % % 89  Post FEV1/FCV % % 98  FEV1-Pre L 1.33  FEV1-Predicted Pre % 84  FEV1-Post L 1.39  DLCO uncorrected ml/min/mmHg 13.22  DLCO UNC% % 71  DLCO corrected ml/min/mmHg 13.22  DLCO COR %Predicted % 71  DLVA Predicted % 100  TLC L 3.76  TLC  % Predicted % 76  RV % Predicted %  90    No results found for: NITRICOXIDE      Assessment & Plan:   COPD (chronic obstructive pulmonary disease) (HCC) Mild emphysema noted on previous CT scan.  Pulmonary function testing showed restrictive changes and decreased diffusing capacity. Appears breathing is back to her baseline from prior hospitalization with COVID-19.  We will check chest x-ray today. May use albuterol as needed.  Plan  Patient Instructions  May discontinue BREO.  May use Albuterol Inhaler 1-2 puffs every 4-6 hrs as needed.  Activity as tolerated.  Chest xray today .  Follow up with Dr. Carlis Abbott in 4-6 months and As needed           Rexene Edison, NP 12/16/2019

## 2019-12-16 NOTE — Assessment & Plan Note (Signed)
Mild emphysema noted on previous CT scan.  Pulmonary function testing showed restrictive changes and decreased diffusing capacity. Appears breathing is back to her baseline from prior hospitalization with COVID-19.  We will check chest x-ray today. May use albuterol as needed.  Plan  Patient Instructions  May discontinue BREO.  May use Albuterol Inhaler 1-2 puffs every 4-6 hrs as needed.  Activity as tolerated.  Chest xray today .  Follow up with Dr. Carlis Abbott in 4-6 months and As needed

## 2019-12-16 NOTE — Patient Instructions (Signed)
May discontinue BREO.  May use Albuterol Inhaler 1-2 puffs every 4-6 hrs as needed.  Activity as tolerated.  Chest xray today .  Follow up with Dr. Carlis Abbott in 4-6 months and As needed

## 2020-01-01 ENCOUNTER — Other Ambulatory Visit: Payer: Self-pay | Admitting: Internal Medicine

## 2020-01-01 DIAGNOSIS — Z1231 Encounter for screening mammogram for malignant neoplasm of breast: Secondary | ICD-10-CM

## 2020-01-15 ENCOUNTER — Other Ambulatory Visit: Payer: Self-pay

## 2020-01-15 ENCOUNTER — Ambulatory Visit
Admission: RE | Admit: 2020-01-15 | Discharge: 2020-01-15 | Disposition: A | Discharge status: Home / Self Care | Payer: Medicare HMO | Source: Ambulatory Visit | Attending: Internal Medicine | Admitting: Internal Medicine

## 2020-01-15 DIAGNOSIS — Z1231 Encounter for screening mammogram for malignant neoplasm of breast: Secondary | ICD-10-CM

## 2020-06-15 ENCOUNTER — Other Ambulatory Visit: Payer: Self-pay | Admitting: Nurse Practitioner

## 2020-06-15 DIAGNOSIS — R9389 Abnormal findings on diagnostic imaging of other specified body structures: Secondary | ICD-10-CM

## 2020-07-01 ENCOUNTER — Ambulatory Visit
Admission: RE | Admit: 2020-07-01 | Discharge: 2020-07-01 | Disposition: A | Discharge status: Home / Self Care | Payer: Medicare HMO | Source: Ambulatory Visit | Attending: Nurse Practitioner | Admitting: Nurse Practitioner

## 2020-07-01 DIAGNOSIS — R9389 Abnormal findings on diagnostic imaging of other specified body structures: Secondary | ICD-10-CM

## 2021-01-14 DIAGNOSIS — T782XXA Anaphylactic shock, unspecified, initial encounter: Secondary | ICD-10-CM | POA: Insufficient documentation

## 2021-02-17 ENCOUNTER — Other Ambulatory Visit: Payer: Self-pay

## 2021-02-17 ENCOUNTER — Emergency Department (HOSPITAL_COMMUNITY)
Admission: EM | Admit: 2021-02-17 | Discharge: 2021-02-17 | Disposition: A | Discharge status: Home / Self Care | Payer: Medicare HMO | Attending: Emergency Medicine | Admitting: Emergency Medicine

## 2021-02-17 ENCOUNTER — Encounter (HOSPITAL_COMMUNITY): Payer: Self-pay

## 2021-02-17 ENCOUNTER — Emergency Department (HOSPITAL_COMMUNITY): Payer: Medicare HMO

## 2021-02-17 DIAGNOSIS — Z992 Dependence on renal dialysis: Secondary | ICD-10-CM | POA: Insufficient documentation

## 2021-02-17 DIAGNOSIS — Z91040 Latex allergy status: Secondary | ICD-10-CM | POA: Insufficient documentation

## 2021-02-17 DIAGNOSIS — Z20822 Contact with and (suspected) exposure to covid-19: Secondary | ICD-10-CM | POA: Diagnosis not present

## 2021-02-17 DIAGNOSIS — J019 Acute sinusitis, unspecified: Secondary | ICD-10-CM | POA: Diagnosis not present

## 2021-02-17 DIAGNOSIS — R0981 Nasal congestion: Secondary | ICD-10-CM | POA: Diagnosis present

## 2021-02-17 DIAGNOSIS — N186 End stage renal disease: Secondary | ICD-10-CM | POA: Diagnosis not present

## 2021-02-17 DIAGNOSIS — Z79899 Other long term (current) drug therapy: Secondary | ICD-10-CM | POA: Diagnosis not present

## 2021-02-17 DIAGNOSIS — R002 Palpitations: Secondary | ICD-10-CM | POA: Diagnosis not present

## 2021-02-17 DIAGNOSIS — I120 Hypertensive chronic kidney disease with stage 5 chronic kidney disease or end stage renal disease: Secondary | ICD-10-CM | POA: Insufficient documentation

## 2021-02-17 DIAGNOSIS — Z87891 Personal history of nicotine dependence: Secondary | ICD-10-CM | POA: Insufficient documentation

## 2021-02-17 DIAGNOSIS — Z7982 Long term (current) use of aspirin: Secondary | ICD-10-CM | POA: Diagnosis not present

## 2021-02-17 DIAGNOSIS — J449 Chronic obstructive pulmonary disease, unspecified: Secondary | ICD-10-CM | POA: Insufficient documentation

## 2021-02-17 LAB — CBC WITH DIFFERENTIAL/PLATELET
Abs Immature Granulocytes: 0.03 10*3/uL (ref 0.00–0.07)
Basophils Absolute: 0.1 10*3/uL (ref 0.0–0.1)
Basophils Relative: 1 %
Eosinophils Absolute: 0.2 10*3/uL (ref 0.0–0.5)
Eosinophils Relative: 2 %
HCT: 38.9 % (ref 36.0–46.0)
Hemoglobin: 12.9 g/dL (ref 12.0–15.0)
Immature Granulocytes: 0 %
Lymphocytes Relative: 11 %
Lymphs Abs: 0.9 10*3/uL (ref 0.7–4.0)
MCH: 31.9 pg (ref 26.0–34.0)
MCHC: 33.2 g/dL (ref 30.0–36.0)
MCV: 96 fL (ref 80.0–100.0)
Monocytes Absolute: 0.8 10*3/uL (ref 0.1–1.0)
Monocytes Relative: 10 %
Neutro Abs: 6.3 10*3/uL (ref 1.7–7.7)
Neutrophils Relative %: 76 %
Platelets: 284 10*3/uL (ref 150–400)
RBC: 4.05 MIL/uL (ref 3.87–5.11)
RDW: 12.3 % (ref 11.5–15.5)
WBC: 8.3 10*3/uL (ref 4.0–10.5)
nRBC: 0 % (ref 0.0–0.2)

## 2021-02-17 LAB — RESP PANEL BY RT-PCR (FLU A&B, COVID) ARPGX2
Influenza A by PCR: NEGATIVE
Influenza B by PCR: NEGATIVE
SARS Coronavirus 2 by RT PCR: NEGATIVE

## 2021-02-17 LAB — BASIC METABOLIC PANEL
Anion gap: 20 — ABNORMAL HIGH (ref 5–15)
BUN: 48 mg/dL — ABNORMAL HIGH (ref 8–23)
CO2: 26 mmol/L (ref 22–32)
Calcium: 9.4 mg/dL (ref 8.9–10.3)
Chloride: 94 mmol/L — ABNORMAL LOW (ref 98–111)
Creatinine, Ser: 14.13 mg/dL — ABNORMAL HIGH (ref 0.44–1.00)
GFR, Estimated: 2 mL/min — ABNORMAL LOW (ref >60)
Glucose, Bld: 113 mg/dL — ABNORMAL HIGH (ref 70–99)
Potassium: 2.9 mmol/L — ABNORMAL LOW (ref 3.5–5.1)
Sodium: 140 mmol/L (ref 135–145)

## 2021-02-17 MED ORDER — AMOXICILLIN-POT CLAVULANATE 875-125 MG PO TABS: 1.0000 | ORAL_TABLET | Freq: Two times a day (BID) | ORAL | 0 refills | Status: DC

## 2021-02-17 MED ORDER — POTASSIUM CHLORIDE CRYS ER 20 MEQ PO TBCR
40.0000 meq | EXTENDED_RELEASE_TABLET | Freq: Once | ORAL | Status: AC
  Administered 2021-02-17: 40 meq via ORAL
  Filled 2021-02-17: qty 2

## 2021-02-17 NOTE — Discharge Instructions (Addendum)
You are seen here today for evaluation of your nasal congestion and ear pain for the past 2 weeks.  Because you have tried over-the-counter medications without relief and the length of duration of your symptoms, you will be prescribed Augmentin for a sinus infection.  Please take the medication as prescribed and complete the entire course.  This medication may upset your stomach, so please take with a probiotic or yogurt.  Please take your already prescribed medication.  Your labs show low potassium.  It is vitally important that you adhere to your medication regimen and take as prescribed.  Please follow-up with your primary care provider for recheck of your electrolytes within the week.  If you have any concern, new or worsening symptoms, please return to the emergency department

## 2021-02-17 NOTE — ED Provider Notes (Signed)
[facility] DEPT Provider Note   CSN: [id number] Arrival date & time: 02/17/21  1207     History Chief Complaint  Patient presents with   Nasal Congestion   Palpitations    [name] is a 75 y.o. female with history of peritoneal dialysis presents to the emergency department for evaluation of constant nasal congestion, bilateral ear pain, frontal and maxillary sinus pressure for the past 2 weeks.  Patient reports she has tried [employer] with no relief of symptoms.  She reports she is feeling mildly nauseous from the phlegm in her throat.  Denies any sore throat, chest pain, shortness of breath, cough, fever.  She mentions she has been having some increase in heart rate since the symptoms started as well.  Denies any dizziness or lightheadedness.  Patient reports she has been unable to swallow her pills due to the phlegm in her throat and has not taken them since onset of the symptoms.   Palpitations Associated symptoms: no back pain, no chest pain, no cough, no shortness of breath and no vomiting       Past Medical History:  Diagnosis Date   Acute kidney injury ([place]) 07/20/2016   Allergic rhinitis    Anxiety    CAP (community acquired pneumonia) 06/2016   [name] 06/19/2016   Cervical disc disease    /notes 07/20/2016   Chest pain at rest 06/18/2012   COPD (chronic obstructive pulmonary disease) ([place]) 05/23/2012   Diverticulosis    DJD (degenerative joint disease)    Esophageal diverticulum    Esophageal stricture    ESRD (end stage renal disease) ([facility])    GERD (gastroesophageal reflux disease)    Hiatal hernia    History of blood transfusion 06/2016   "when I was hospitalized w/sepsis"   Hyperlipidemia 05/23/2012   T. Chol 234, LDL 130    Hypertension    IBS (irritable bowel syndrome)    Lung nodules    hx  with extensive workup including lung biopsy ruling out Sjogren's disease/notes 07/20/2016   Osteopenia    Recurrent  sinusitis    /noters 07/20/2016   Renal insufficiency    Sinus headache    "daily lately" (07/20/2016)   Traumatic arthritis     Patient Active Problem List   Diagnosis Date Noted   Postural dizziness with presyncope 08/20/2019   Orthostasis 08/18/2019   Protein-calorie malnutrition, severe 02/06/2018   Peritonitis ([place]) 02/05/2018   Hypokalemia 02/05/2018   Pulmonary nodules    ESRD (end stage renal disease) on dialysis ([place])    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   Hyperphosphatemia 07/25/2016   COPD (chronic obstructive pulmonary disease) ([place]) 05/23/2012   Acute respiratory failure with hypoxia ([place]) 05/23/2012   Essential hypertension 12/25/2007    Past Surgical History:  Procedure Laterality Date   ANTERIOR CERVICAL DECOMP/DISCECTOMY FUSION  2005   BACK SURGERY     COLONOSCOPY  2001   /notes 09/21/2010   CYSTECTOMY Bilateral    hx/notes 09/21/2010   IR GENERIC HISTORICAL  07/20/2016   IR FLUORO GUIDE CV LINE RIGHT 07/20/2016 [name], MD [facility]   IR GENERIC HISTORICAL  07/20/2016   IR US GUIDE VASC ACCESS RIGHT 07/20/2016 [name], MD [facility]   LUNG BIOPSY Left    left side, not cancerous, thoracotomy   TONSILLECTOMY AND ADENOIDECTOMY     hx/notes 09/21/2010   VAGINAL HYSTERECTOMY     hx w/oophorectomy/notes 09/21/2010  OB History   No obstetric history on file.     Family History  Problem Relation Age of Onset   Throat cancer Father    Hypertension Sister    Thyroid disease Daughter    Hypertension Sister        x 2   Breast cancer Maternal Aunt    Blindness Sister        legally blind   Colon cancer Neg Hx    Stomach cancer Neg Hx     Social History   Tobacco Use   Smoking status: Former    Packs/day: 0.12    Years: 8.00    Pack years: 0.96    Types: Cigarettes    Quit date: 05/04/1985    Years since quitting: 35.8   Smokeless tobacco: Never  Vaping Use   Vaping  Use: Never used  Substance Use Topics   Alcohol use: Yes    Alcohol/week: 1.0 standard drink    Types: 1 Cans of beer per week   Drug use: No    Home Medications Prior to Admission medications   Medication Sig Start Date End Date Taking? Authorizing Provider  acetaminophen (TYLENOL) 500 MG tablet Take 1,000 mg by mouth every 6 (six) hours as needed (for headaches or pain).     [provider]  albuterol (VENTOLIN HFA) 108 (90 Base) MCG/ACT inhaler Inhale 1-2 puffs into the lungs every 6 (six) hours as needed for wheezing or shortness of breath. 12/16/19   Parrett, Fonnie Mu, NP  amoxicillin-clavulanate (AUGMENTIN) 875-125 MG tablet Take 1 tablet by mouth every 12 (twelve) hours. 02/17/21  Yes Sherrell Puller, PA-C  aspirin EC 81 MG tablet Take 81 mg by mouth daily.    [provider]  atorvastatin (LIPITOR) 10 MG tablet Take 1 tablet (10 mg total) by mouth daily. 10/20/15   Biagio Borg, MD  BREO ELLIPTA 100-25 MCG/INH AEPB Inhale 1 puff into the lungs daily.  06/30/19   [provider]  calcitRIOL (ROCALTROL) 0.25 MCG capsule Take 0.25 mcg by mouth daily.  03/14/19   [provider]  Cholecalciferol (VITAMIN D3) 50 MCG (2000 UT) TABS Take 4,000 Units by mouth daily.    [provider]  COLACE 100 MG capsule Take 100 mg by mouth daily as needed for mild constipation.  03/14/19   [provider]  ferric citrate (AURYXIA) 1 GM 210 MG(Fe) tablet Take 210 mg by mouth 3 (three) times daily with meals.    [provider]  lactulose (CHRONULAC) 10 GM/15ML solution Take 10 g by mouth daily as needed for mild constipation.    [provider]  loratadine-pseudoephedrine (CLARITIN-D 12 HOUR) 5-120 MG tablet Take 1 tablet by mouth every 12 (twelve) hours.    [provider]  Melatonin 5 MG TABS Take 5 mg by mouth at bedtime as needed (for sleep).     [provider]  midodrine (PROAMATINE) 5 MG tablet Take 1 tablet (5 mg  total) by mouth 3 (three) times daily with meals. 08/21/19   Amin, Jeanella Flattery, MD  multivitamin (RENA-VIT) TABS tablet Take 1 tablet by mouth daily.    [provider]  pantoprazole (PROTONIX) 40 MG tablet Take 1 tablet (40 mg total) by mouth daily. 10/20/15   Biagio Borg, MD  Potassium Chloride ER 20 MEQ TBCR Take 20 mEq by mouth daily.  05/24/19   [provider]  sodium chloride (OCEAN) 0.65 % SOLN nasal spray Place 2 sprays into  both nostrils daily as needed for congestion.     [provider]  torsemide (DEMADEX) 20 MG tablet Take 20 mg by mouth daily.  01/03/18   [provider]    Allergies    Ace inhibitors, Calcium channel blockers, Diltiazem, Other, Pneumococcal vaccines, Tetanus toxoid, Latex, and Tape  Review of Systems   Review of Systems  Constitutional:  Negative for chills and fever.  HENT:  Positive for congestion, ear pain and sinus pressure. Negative for ear discharge, rhinorrhea, sore throat and trouble swallowing.   Eyes:  Negative for pain and visual disturbance.  Respiratory:  Negative for cough and shortness of breath.   Cardiovascular:  Positive for palpitations. Negative for chest pain.  Gastrointestinal:  Negative for abdominal pain and vomiting.  Genitourinary:  Negative for dysuria and hematuria.  Musculoskeletal:  Negative for arthralgias and back pain.  Skin:  Negative for color change and rash.  Neurological:  Negative for seizures, syncope and headaches.  All other systems reviewed and are negative.  Physical Exam Updated Vital Signs BP 125/68   Pulse 87   Temp 97.6 F (36.4 C) (Oral)   Resp 20   Ht '5\' 2"'$  (1.575 m)   Wt 49.9 kg   SpO2 97%   BMI 20.12 kg/m   Physical Exam Vitals and nursing note reviewed.  Constitutional:      General: She is not in acute distress.    Appearance: Normal appearance. She is not toxic-appearing.  HENT:     Head: Normocephalic and atraumatic.     Comments: Frontal and  maxillary sinus pressure.    Right Ear: Tympanic membrane, ear canal and external ear normal.     Left Ear: Tympanic membrane, ear canal and external ear normal.     Nose: No congestion.     Comments: Scant clear nasal discharge noted in bilateral naris.  Mild erythema.    Mouth/Throat:     Mouth: Mucous membranes are moist.     Pharynx: No oropharyngeal exudate or posterior oropharyngeal erythema.     Comments: Airway patent.  Uvula midline.  No postnasal drip noted. Eyes:     General: No scleral icterus. Cardiovascular:     Rate and Rhythm: Normal rate and regular rhythm.  Pulmonary:     Effort: Pulmonary effort is normal. No respiratory distress.     Breath sounds: Normal breath sounds. No wheezing, rhonchi or rales.     Comments: Lungs clear to auscultation bilaterally.  No respiratory distress, tripoding, cyanosis, or nasal flaring noted.  Patient speaking in full sentences with ease. Abdominal:     General: Abdomen is flat. Bowel sounds are normal.     Palpations: Abdomen is soft.  Musculoskeletal:        General: No deformity.     Cervical back: Normal range of motion and neck supple. No rigidity.  Lymphadenopathy:     Cervical: No cervical adenopathy.  Skin:    General: Skin is warm and dry.  Neurological:     General: No focal deficit present.     Mental Status: She is alert. Mental status is at baseline.     Motor: No weakness.    ED Results / Procedures / Treatments   Labs (all labs ordered are listed, but only abnormal results are displayed) Labs Reviewed  BASIC METABOLIC PANEL - Abnormal; Notable for the following components:      Result Value   Potassium 2.9 (*)    Chloride 94 (*)  Glucose, Bld 113 (*)    BUN 48 (*)    Creatinine, Ser 14.13 (*)    GFR, Estimated 2 (*)    Anion gap 20 (*)    All other components within normal limits  RESP PANEL BY RT-PCR (FLU A&B, COVID) ARPGX2  CBC WITH DIFFERENTIAL/PLATELET    EKG EKG  Interpretation  Date/Time:  Wednesday February 17 2021 12:59:05 EDT Ventricular Rate:  87 PR Interval:  150 QRS Duration: 92 QT Interval:  394 QTC Calculation: 474 R Axis:   20 Text Interpretation: Sinus rhythm Nonspecific T abnormalities, lateral leads since last tracing no significant change Confirmed by Daleen Bo [phone]) on 02/17/2021 3:01:26 PM  Radiology DG Chest 2 View  Result Date: 02/17/2021 CLINICAL DATA:  Nasal congestion, cough, palpitations. EXAM: CHEST - 2 VIEW COMPARISON:  12/16/2019 and CT chest 07/01/2020. FINDINGS: Trachea is midline. Heart size normal. Thoracic aorta is calcified. Postoperative changes in the left hemithorax with associated volume loss. Extensive bilateral pulmonary parenchymal scarring. No superimposed airspace consolidation or pleural fluid. Small hiatal hernia. Chronic pneumoperitoneum. IMPRESSION: 1. No acute findings. 2. Fairly extensive bilateral pulmonary parenchymal and postoperative scarring. 3. Chronic pneumoperitoneum, presumably related to peritoneal dialysis. Electronically Signed   By: Lorin Picket M.D.   On: 02/17/2021 13:34    Procedures Procedures   Medications Ordered in ED Medications  potassium chloride SA (KLOR-CON) CR tablet 40 mEq (has no administration in time range)    ED Course  I have reviewed the triage vital signs and the nursing notes.  Pertinent labs & imaging results that were available during my care of the patient were reviewed by me and considered in my medical decision making (see chart for details).   [name] 75 year old female presenting for evaluation of possible sensation with past 2 weeks.  Although less likely this is COVID or the flu given symptom duration, respiratory panel ordered.  Negative for COVID and flu.  CBC benign.  No leukocytosis or anemia present.  Her BMP shows some electrolyte abnormalities.  Patient is on baseline 20 mill equivalents of potassium.  She denies taking this  medication as she was nauseous these past 2 weeks from the phlegm in the back of her throat.  Comparative labs from over a year ago showing increase in creatinine from 7 to 14.  She reports she does not have a normal creatinine is, but just recently saw her primary care doctor.  Her potassium is 2.9.  Will give 40 mEq potassium p.o.  Chest x-ray shows chronic findings, but no acute changes.  EKG shows sinus rhythm with no significant change since last reading.  Vital signs stable/no fever.  I discussed with patient the lab and imaging findings.  Discussed with her the importance of taking her medications as prescribed, especially her potassium.  I stressed that she needs to follow-up with her primary care provider within the next week to recheck these electrolytes to see if they have balanced out.  We discussed that her palpitations might be from her electrolytes being off and not taking her medication as prescribed.  Discussed follow-up with her primary care if the palpitations continue.  Because of the duration of her symptoms and failed over-the-counter treatment, will prescribe Augmentin for sinus infection.  Discussed strict return precautions.  Patient agrees with plan.  Patient is stable and being discharged home in good condition.  MDM Rules/Calculators/A&P  Final Clinical Impression(s) / ED Diagnoses Final diagnoses:  Acute non-recurrent sinusitis, unspecified location    Rx / DC Orders ED Discharge Orders          Ordered    amoxicillin-clavulanate (AUGMENTIN) 875-125 MG tablet  Every 12 hours        02/17/21 1720             Sherrell Puller, PA-C 02/18/21 0259    Regan Lemming, MD 02/18/21 1049

## 2021-02-17 NOTE — ED Triage Notes (Signed)
Patient c/o nasal congestion and feeling that her "ears are stopped up" x 2 weeks. Patient denies any fever, sore throat, or body aches.

## 2021-02-17 NOTE — ED Provider Notes (Signed)
Emergency Medicine Provider Triage Evaluation Note  [name] , a 75 y.o. female  was evaluated in triage.  Pt complains of Sinus pain pressure, nasal congestion and rhinorrhea, pot nasal drip, no cough.  Ears feel "stopped up."   She reports that her symptoms are ongoing for a full two weeks.  She has been taking claratin, which didn't help and tried allegra which didn't help.    She has been taking flonase with out relief.   No body aches, no fevers or chills.   Does PD every night.    She reports concern that her heart rate is running high than usual. Hasn't had any decongestants in days.   She reports that she can feel that her heart rate is running higher.  Normally runs in the 70/80s.   She notes that for the past few days she hasn't been able to take her potassium supplements as they get stuck in her throat when she tries.   Review of Systems  Positive: Palpitations, nasal congestion, cough,  Negative: Chest pain, abdominal pain, fevers  Physical Exam  BP 124/85   Pulse 96   Temp 97.6 F (36.4 C) (Oral)   Resp 18   Ht '5\' 2"'$  (1.575 m)   Wt 49.9 kg   SpO2 100%   BMI 20.12 kg/m  Gen:   Awake, no distress   Resp:  Normal effort  MSK:   Moves extremities without difficulty  Other:  Awake and alert, answers questions with out difficulty.   Medical Decision Making  Medically screening exam initiated at 12:42 PM.  Appropriate orders placed.  [name] was informed that the remainder of the evaluation will be completed by another provider, this initial triage assessment does not replace that evaluation, and the importance of remaining in the ED until their evaluation is complete.  Note: Portions of this report may have been transcribed using voice recognition software. Every effort was made to ensure accuracy; however, inadvertent computerized transcription errors may be present    Lorin Glass, PA-C 02/17/21 1253    Daleen Bo, MD 02/17/21  1623

## 2021-03-18 ENCOUNTER — Other Ambulatory Visit: Payer: Self-pay

## 2021-03-18 ENCOUNTER — Emergency Department (HOSPITAL_COMMUNITY): Payer: Medicare HMO

## 2021-03-18 ENCOUNTER — Encounter (HOSPITAL_COMMUNITY): Payer: Self-pay | Admitting: Emergency Medicine

## 2021-03-18 ENCOUNTER — Emergency Department (HOSPITAL_COMMUNITY)
Admission: EM | Admit: 2021-03-18 | Discharge: 2021-03-19 | Disposition: A | Discharge status: Home / Self Care | Payer: Medicare HMO | Attending: Emergency Medicine | Admitting: Emergency Medicine

## 2021-03-18 DIAGNOSIS — Z7951 Long term (current) use of inhaled steroids: Secondary | ICD-10-CM | POA: Diagnosis not present

## 2021-03-18 DIAGNOSIS — N186 End stage renal disease: Secondary | ICD-10-CM | POA: Insufficient documentation

## 2021-03-18 DIAGNOSIS — R002 Palpitations: Secondary | ICD-10-CM | POA: Insufficient documentation

## 2021-03-18 DIAGNOSIS — Z7982 Long term (current) use of aspirin: Secondary | ICD-10-CM | POA: Diagnosis not present

## 2021-03-18 DIAGNOSIS — R42 Dizziness and giddiness: Secondary | ICD-10-CM | POA: Diagnosis present

## 2021-03-18 DIAGNOSIS — J449 Chronic obstructive pulmonary disease, unspecified: Secondary | ICD-10-CM | POA: Insufficient documentation

## 2021-03-18 DIAGNOSIS — I951 Orthostatic hypotension: Secondary | ICD-10-CM | POA: Insufficient documentation

## 2021-03-18 DIAGNOSIS — Z87891 Personal history of nicotine dependence: Secondary | ICD-10-CM | POA: Insufficient documentation

## 2021-03-18 DIAGNOSIS — I120 Hypertensive chronic kidney disease with stage 5 chronic kidney disease or end stage renal disease: Secondary | ICD-10-CM | POA: Insufficient documentation

## 2021-03-18 DIAGNOSIS — R110 Nausea: Secondary | ICD-10-CM | POA: Insufficient documentation

## 2021-03-18 DIAGNOSIS — Z79899 Other long term (current) drug therapy: Secondary | ICD-10-CM | POA: Insufficient documentation

## 2021-03-18 LAB — CBC WITH DIFFERENTIAL/PLATELET
Abs Immature Granulocytes: 0.06 10*3/uL (ref 0.00–0.07)
Basophils Absolute: 0.1 10*3/uL (ref 0.0–0.1)
Basophils Relative: 1 %
Eosinophils Absolute: 0.1 10*3/uL (ref 0.0–0.5)
Eosinophils Relative: 2 %
HCT: 37.9 % (ref 36.0–46.0)
Hemoglobin: 12.2 g/dL (ref 12.0–15.0)
Immature Granulocytes: 1 %
Lymphocytes Relative: 15 %
Lymphs Abs: 1.4 10*3/uL (ref 0.7–4.0)
MCH: 30.4 pg (ref 26.0–34.0)
MCHC: 32.2 g/dL (ref 30.0–36.0)
MCV: 94.5 fL (ref 80.0–100.0)
Monocytes Absolute: 0.8 10*3/uL (ref 0.1–1.0)
Monocytes Relative: 8 %
Neutro Abs: 6.9 10*3/uL (ref 1.7–7.7)
Neutrophils Relative %: 73 %
Platelets: 310 10*3/uL (ref 150–400)
RBC: 4.01 MIL/uL (ref 3.87–5.11)
RDW: 12.7 % (ref 11.5–15.5)
WBC: 9.4 10*3/uL (ref 4.0–10.5)
nRBC: 0 % (ref 0.0–0.2)

## 2021-03-18 LAB — BASIC METABOLIC PANEL
Anion gap: 17 — ABNORMAL HIGH (ref 5–15)
BUN: 28 mg/dL — ABNORMAL HIGH (ref 8–23)
CO2: 25 mmol/L (ref 22–32)
Calcium: 9.2 mg/dL (ref 8.9–10.3)
Chloride: 93 mmol/L — ABNORMAL LOW (ref 98–111)
Creatinine, Ser: 11.09 mg/dL — ABNORMAL HIGH (ref 0.44–1.00)
GFR, Estimated: 3 mL/min — ABNORMAL LOW (ref >60)
Glucose, Bld: 98 mg/dL (ref 70–99)
Potassium: 2.7 mmol/L — CL (ref 3.5–5.1)
Sodium: 135 mmol/L (ref 135–145)

## 2021-03-18 NOTE — ED Provider Notes (Signed)
Emergency Medicine Provider Triage Evaluation Note  [name] , a 75 y.o. female  was evaluated in triage.  Pt complains of general weakness, nausea, inability to eat regularly.  She feels like she has lost about 10 pounds.  She has had sinus symptoms for 2 months, mostly with a sensation of having to clear her throat, but unable to get much mucus out.  She has been treated with multiple rounds of antibiotics, without improvement.  She had a CT scan of the head done, by her ENT provider, yesterday, which was documented as normal in the EMR.  Patient reports feeling dizzy when she stands up.  She has end-stage renal disease and does home peritoneal dialysis every night.  She is not having fever, shortness of breath or chest pain.  Review of Systems  Positive: Cough with congested feeling, nausea, decreased oral intake Negative: Fever, chills, chest pain, shortness of breath  Physical Exam  BP 104/72   Pulse (!) 108   Temp 98.5 F (36.9 C) (Oral)   Resp 16   SpO2 97%  Gen:   Awake, no distress   Resp:  Normal effort no wheezes or rales. MSK:   Moves extremities without difficulty  Other:  Abdomen soft and nontender.  Orthostatic blood pressure pulses are positive. Nursing asked to administer oral fluids, and give IV bolus if unable to tolerate oral fluids.  Medical Decision Making  Medically screening exam initiated at 9:36 PM.  Appropriate orders placed.  [name] was informed that the remainder of the evaluation will be completed by another provider, this initial triage assessment does not replace that evaluation, and the importance of remaining in the ED until their evaluation is complete.  Nonspecific symptoms after using antibiotics for sinus infection.  CT yesterday did not indicate sinusitis.   Daleen Bo, MD 03/18/21 2221

## 2021-03-18 NOTE — ED Triage Notes (Signed)
Pt here for generalized weakness and fatigue for a couple weeks now. Pt reports feeling dehydrated, pt gets peritoneal dialysis every night. Pt reports having been feeling sick for 2 months now.

## 2021-03-18 NOTE — ED Notes (Signed)
RN completed orthostatic vitals, pt dizzy, unsteady while standing.

## 2021-03-19 MED ORDER — MIDODRINE HCL 5 MG PO TABS
5.0000 mg | ORAL_TABLET | Freq: Once | ORAL | Status: AC
  Administered 2021-03-19: 5 mg via ORAL
  Filled 2021-03-19: qty 1

## 2021-03-19 MED ORDER — ONDANSETRON 4 MG PO TBDP
4.0000 mg | ORAL_TABLET | Freq: Once | ORAL | Status: AC
  Administered 2021-03-19: 4 mg via ORAL
  Filled 2021-03-19: qty 1

## 2021-03-19 MED ORDER — ONDANSETRON 4 MG PO TBDP: 4.0000 mg | ORAL_TABLET | Freq: Three times a day (TID) | ORAL | 0 refills | Status: AC | PRN

## 2021-03-19 MED ORDER — SODIUM CHLORIDE 0.9 % IV BOLUS
500.0000 mL | Freq: Once | INTRAVENOUS | Status: AC
  Administered 2021-03-19: 500 mL via INTRAVENOUS

## 2021-03-19 NOTE — ED Provider Notes (Signed)
[facility] EMERGENCY DEPARTMENT Provider Note  CSN: [id number] Arrival date & time: 03/18/21 1904  Chief Complaint(s) Dizziness  HPI [name] is a 75 y.o. female    Dizziness Quality:  Lightheadedness Severity:  Moderate Onset quality:  Gradual Duration:  1 month Timing:  Constant Progression:  Waxing and waning Context: standing up   Relieved by:  Being still and lying down Worsened by:  Standing up Associated symptoms: nausea and palpitations   Associated symptoms: no chest pain, no syncope and no vomiting    Patient reports that she has had decreased oral intake due to the nausea.  She denies any emesis or profuse watery diarrhea.  Has not taken midodrine for several months. Was told to stop taking it by doctors.  Past Medical History Past Medical History:  Diagnosis Date   Acute kidney injury ([place]) 07/20/2016   Allergic rhinitis    Anxiety    CAP (community acquired pneumonia) 06/2016   [name] 06/19/2016   Cervical disc disease    /notes 07/20/2016   Chest pain at rest 06/18/2012   COPD (chronic obstructive pulmonary disease) ([place]) 05/23/2012   Diverticulosis    DJD (degenerative joint disease)    Esophageal diverticulum    Esophageal stricture    ESRD (end stage renal disease) ([facility])    GERD (gastroesophageal reflux disease)    Hiatal hernia    History of blood transfusion 06/2016   "when I was hospitalized w/sepsis"   Hyperlipidemia 05/23/2012   T. Chol 234, LDL 130    Hypertension    IBS (irritable bowel syndrome)    Lung nodules    hx  with extensive workup including lung biopsy ruling out Sjogren's disease/notes 07/20/2016   Osteopenia    Recurrent sinusitis    /noters 07/20/2016   Renal insufficiency    Sinus headache    "daily lately" (07/20/2016)   Traumatic arthritis    Patient Active Problem List   Diagnosis Date Noted   Anaphylactic shock, unspecified, initial encounter 01/14/2021   Postural dizziness with  presyncope 08/20/2019   Orthostasis 08/18/2019   Protein-calorie malnutrition, severe 02/06/2018   Peritonitis ([place]) 02/05/2018   Hypokalemia 02/05/2018   Pulmonary nodules    ESRD (end stage renal disease) on dialysis ([place])    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 Authorizing Provider  acetaminophen (TYLENOL) 500 MG tablet Take 1,000 mg by mouth every 6 (six) hours as needed (for headaches or pain).    Yes [provider]  albuterol (VENTOLIN HFA) 108 (90 Base) MCG/ACT inhaler Inhale 1-2 puffs into the lungs every 6 (six) hours as needed for wheezing or shortness of breath. 12/16/19  Yes Parrett, Tammy S, NP  aspirin EC 81 MG tablet Take 81 mg by mouth daily.   Yes [provider]  atorvastatin (LIPITOR) 20 MG tablet Take 20 mg by mouth daily. 02/24/21  Yes [provider]  BREO ELLIPTA 100-25 MCG/INH AEPB Inhale 1 puff into the lungs daily.  06/30/19  Yes [provider]  calcitRIOL (ROCALTROL) 0.25 MCG capsule Take 0.25 mcg by mouth daily.  03/14/19  Yes [provider]  COLACE 100 MG capsule Take 100 mg by mouth daily as needed for mild  constipation.  03/14/19  Yes [provider]  cyclobenzaprine (FLEXERIL) 5 MG tablet Take 5 mg by mouth 3 (three) times daily as needed for muscle spasms. 01/15/21  Yes [provider]  ergocalciferol (VITAMIN D2) 1.25 MG (50000 UT) capsule Take 50,000 Units by mouth every Sunday.   Yes [provider]  ferric citrate (AURYXIA) 1 GM 210 MG(Fe) tablet Take 210 mg by mouth 3 (three) times daily with meals.   Yes [provider]  fluticasone  (FLONASE) 50 MCG/ACT nasal spray Place 1 spray into both nostrils daily as needed for allergies or rhinitis. 01/15/21  Yes [provider]  lactulose (CHRONULAC) 10 GM/15ML solution Take 10 g by mouth daily as needed for mild constipation.   Yes [provider]  loratadine-pseudoephedrine (CLARITIN-D 12 HOUR) 5-120 MG tablet Take 1 tablet by mouth 2 (two) times daily as needed for allergies.   Yes [provider]  Melatonin 5 MG TABS Take 5 mg by mouth at bedtime as needed (for sleep).    Yes [provider]  multivitamin (RENA-VIT) TABS tablet Take 1 tablet by mouth daily.   Yes [provider]  ondansetron (ZOFRAN ODT) 4 MG disintegrating tablet Take 1 tablet (4 mg total) by mouth every 8 (eight) hours as needed for up to 3 days for nausea or vomiting. 03/19/21 03/22/21 Yes Jurnee Nakayama, Grayce Sessions, MD  pantoprazole (PROTONIX) 40 MG tablet Take 1 tablet (40 mg total) by mouth daily. 10/20/15  Yes Biagio Borg, MD  Potassium Chloride ER 20 MEQ TBCR Take 20 mEq by mouth daily.  05/24/19  Yes [provider]  sodium chloride (OCEAN) 0.65 % SOLN nasal spray Place 2 sprays into both nostrils daily as needed for congestion.    Yes [provider]  torsemide (DEMADEX) 20 MG tablet Take 20 mg by mouth daily.  01/03/18  Yes [provider]  amoxicillin-clavulanate (AUGMENTIN) 875-125 MG tablet Take 1 tablet by mouth every 12 (twelve) hours. Patient not taking: Reported on 03/19/2021 02/17/21   Sherrell Puller, PA-C  atorvastatin (LIPITOR) 10 MG tablet Take 1 tablet (10 mg total) by mouth daily. Patient not taking: Reported on 03/19/2021 10/20/15   Biagio Borg, MD  midodrine (PROAMATINE) 5 MG tablet Take 1 tablet (5 mg total) by mouth 3 (three) times daily with meals. Patient not taking: No sig reported 08/21/19   Damita Lack, MD                                                                                                                                     Past Surgical History Past Surgical History:  Procedure Laterality Date   ANTERIOR CERVICAL DECOMP/DISCECTOMY FUSION  2005   BACK SURGERY     COLONOSCOPY  2001   Archie Endo 09/21/2010   CYSTECTOMY Bilateral    hx/notes 09/21/2010   IR GENERIC HISTORICAL  07/20/2016   IR FLUORO GUIDE CV  LINE RIGHT 07/20/2016 [name], MD [facility]   IR GENERIC HISTORICAL  07/20/2016   IR US GUIDE VASC ACCESS RIGHT 07/20/2016 [name], MD [facility]   LUNG BIOPSY Left    left side, not cancerous, thoracotomy   TONSILLECTOMY AND ADENOIDECTOMY     hx/notes 09/21/2010   VAGINAL HYSTERECTOMY     hx w/oophorectomy/notes 09/21/2010   Family History Family History  Problem Relation Age of Onset   Throat cancer Father    Hypertension Sister    Thyroid disease Daughter    Hypertension Sister        x 2   Breast cancer Maternal Aunt    Blindness Sister        legally blind   Colon cancer Neg Hx    Stomach cancer Neg Hx     Social History Social History   Tobacco Use   Smoking status: Former    Packs/day: 0.12    Years: 8.00    Pack years: 0.96    Types: Cigarettes    Quit date: 05/04/1985    Years since quitting: 35.8   Smokeless tobacco: Never  Vaping Use   Vaping Use: Never used  Substance Use Topics   Alcohol use: Yes    Alcohol/week: 1.0 standard drink    Types: 1 Cans of beer per week   Drug use: No   Allergies Ace inhibitors, Calcium channel blockers, Diltiazem, Other, Pneumococcal vaccines, Tetanus toxoid, Latex, and Tape  Review of Systems Review of Systems  Cardiovascular:  Positive for palpitations. Negative for chest pain and syncope.  Gastrointestinal:  Positive for nausea. Negative for vomiting.  Neurological:  Positive for dizziness.  All other systems are reviewed and are negative for acute change except as noted in the HPI  Physical Exam Vital Signs  I have reviewed the triage vital signs BP 116/64   Pulse 90   Temp 97.7 F (36.5 C) (Oral)    Resp 18   Ht 5' (1.524 m)   Wt 47.6 kg   SpO2 100%   BMI 20.51 kg/m   Physical Exam Vitals reviewed.  Constitutional:      General: She is not in acute distress.    Appearance: She is well-developed. She is not diaphoretic.  HENT:     Head: Normocephalic and atraumatic.     Nose: Nose normal.  Eyes:     General: No scleral icterus.       Right eye: No discharge.        Left eye: No discharge.     Conjunctiva/sclera: Conjunctivae normal.     Pupils: Pupils are equal, round, and reactive to light.  Cardiovascular:     Rate and Rhythm: Normal rate and regular rhythm.     Heart sounds: No murmur heard.   No friction rub. No gallop.  Pulmonary:     Effort: Pulmonary effort is normal. No respiratory distress.     Breath sounds: Normal breath sounds. No stridor. No rales.  Abdominal:     General: There is no distension.     Palpations: Abdomen is soft.     Tenderness: There is no abdominal tenderness.  Musculoskeletal:        General: No tenderness.     Cervical back: Normal range of motion and neck supple.  Skin:    General: Skin is warm and dry.     Findings: No erythema or rash.  Neurological:     Mental Status: She is alert and oriented to  person, place, and time.    ED Results and Treatments Labs (all labs ordered are listed, but only abnormal results are displayed) Labs Reviewed  BASIC METABOLIC PANEL - Abnormal; Notable for the following components:      Result Value   Potassium 2.7 (*)    Chloride 93 (*)    BUN 28 (*)    Creatinine, Ser 11.09 (*)    GFR, Estimated 3 (*)    Anion gap 17 (*)    All other components within normal limits  CBC WITH DIFFERENTIAL/PLATELET                                                                                                                         EKG  EKG Interpretation  Date/Time:  Friday March 19 2021 00:31:51 EST Ventricular Rate:  97 PR Interval:  154 QRS Duration: 81 QT Interval:  358 QTC  Calculation: 455 R Axis:   -25 Text Interpretation: Sinus rhythm Borderline left axis deviation Nonspecific T abnrm, anterolateral leads Confirmed by Addison Lank [phone]) on 03/19/2021 4:22:19 AM       Radiology DG Chest 2 View  Result Date: 03/18/2021 CLINICAL DATA:  Cough EXAM: CHEST - 2 VIEW COMPARISON:  02/17/2021 FINDINGS: Postsurgical changes in the left upper lung. Scarring/emphysematous changes. No focal consolidation. No pleural effusion or pneumothorax. The heart is normal in size. Cervical spine fixation hardware, incompletely visualized. IMPRESSION: Postsurgical changes with scattered areas of scarring. No evidence of acute cardiopulmonary disease. Electronically Signed   By: Julian Hy M.D.   On: 03/18/2021 22:49    Pertinent labs & imaging results that were available during my care of the patient were reviewed by me and considered in my medical decision making (see MDM for details).  Medications Ordered in ED Medications  sodium chloride 0.9 % bolus 500 mL (0 mLs Intravenous Stopped 03/19/21 0337)  ondansetron (ZOFRAN-ODT) disintegrating tablet 4 mg (4 mg Oral Given 03/19/21 0211)  midodrine (PROAMATINE) tablet 5 mg (5 mg Oral Given 03/19/21 0448)                                                                                                                                     Procedures Procedures  (including critical care time)  Medical Decision Making / ED Course I have reviewed the nursing notes for this encounter and the patient's prior records (if available in EHR or  on provided paperwork).  VICKE PLOTNER was evaluated in Emergency Department on 03/19/2021 for the symptoms described in the history of present illness. She was evaluated in the context of the global COVID-19 pandemic, which necessitated consideration that the patient might be at risk for infection with the SARS-CoV-2 virus that causes COVID-19. Institutional protocols and algorithms that  pertain to the evaluation of patients at risk for COVID-19 are in a state of rapid change based on information released by regulatory bodies including the CDC and federal and state organizations. These policies and algorithms were followed during the patient's care in the ED.     Patient with a history of orthostasis who was previously on midodrine here for symptoms consistent with orthostasis. Patient has a history of ESRD who performs peritoneal dialysis.  Abdomen is benign. Labs consistent with ESRD. Mildly decreased potassium. No leukocytosis or anemia.  Pertinent labs & imaging results that were available during my care of the patient were reviewed by me and considered in my medical decision making:  Patient provided with IV fluids and oral hydration.  Given a dose of midodrine. On reassessment blood pressures improved though still orthostatic but less symptomatic. She feels she is stable for discharge home.  Final Clinical Impression(s) / ED Diagnoses Final diagnoses:  Orthostasis   The patient appears reasonably screened and/or stabilized for discharge and I doubt any other medical condition or other [facility] requiring further screening, evaluation, or treatment in the ED at this time prior to discharge. Safe for discharge with strict return precautions.  Disposition: Discharge  Condition: Good  I have discussed the results, Dx and Tx plan with the patient/family who expressed understanding and agree(s) with the plan. Discharge instructions discussed at length. The patient/family was given strict return precautions who verbalized understanding of the instructions. No further questions at time of discharge.    ED Discharge Orders          Ordered    ondansetron (ZOFRAN ODT) 4 MG disintegrating tablet  Every 8 hours PRN        03/19/21 0638              Follow Up: [facility], [place] [id number] [phone]  Call  to schedule an  appointment for close follow up regarding your orthostasis     This chart was dictated using voice recognition software.  Despite best efforts to proofread,  errors can occur which can change the documentation meaning.    Fatima Blank, MD 03/19/21 [phone]

## 2021-03-19 NOTE — Discharge Instructions (Signed)
Please take your midodrine as prescribed. I have prescribed you Zofran to help with your nausea and increase your oral intake and hydration.

## 2021-03-19 NOTE — ED Notes (Signed)
Pt's vitals checked late due to sort

## 2021-03-19 NOTE — ED Notes (Signed)
Patient verbalizes understanding of discharge instructions. Opportunity for questioning and answers were provided. Armband removed by staff, pt discharged from ED via wheelchair.  

## 2021-03-19 NOTE — ED Notes (Signed)
Pt given something to drink at this time. 

## 2021-03-19 NOTE — ED Notes (Signed)
Orthos completed and MD notified. Pt reports feeling dizzy when standing up

## 2021-04-03 ENCOUNTER — Encounter (HOSPITAL_COMMUNITY): Payer: Self-pay | Admitting: *Deleted

## 2021-04-03 ENCOUNTER — Emergency Department (HOSPITAL_COMMUNITY): Payer: Medicare HMO

## 2021-04-03 ENCOUNTER — Other Ambulatory Visit: Payer: Self-pay

## 2021-04-03 ENCOUNTER — Emergency Department (HOSPITAL_COMMUNITY)
Admission: EM | Admit: 2021-04-03 | Discharge: 2021-04-03 | Disposition: A | Discharge status: Home / Self Care | Payer: Medicare HMO | Attending: Emergency Medicine | Admitting: Emergency Medicine

## 2021-04-03 DIAGNOSIS — Z20822 Contact with and (suspected) exposure to covid-19: Secondary | ICD-10-CM | POA: Diagnosis not present

## 2021-04-03 DIAGNOSIS — J449 Chronic obstructive pulmonary disease, unspecified: Secondary | ICD-10-CM | POA: Diagnosis not present

## 2021-04-03 DIAGNOSIS — Z87891 Personal history of nicotine dependence: Secondary | ICD-10-CM | POA: Diagnosis not present

## 2021-04-03 DIAGNOSIS — J329 Chronic sinusitis, unspecified: Secondary | ICD-10-CM | POA: Diagnosis not present

## 2021-04-03 DIAGNOSIS — Z91040 Latex allergy status: Secondary | ICD-10-CM | POA: Insufficient documentation

## 2021-04-03 DIAGNOSIS — E876 Hypokalemia: Secondary | ICD-10-CM | POA: Diagnosis not present

## 2021-04-03 DIAGNOSIS — I120 Hypertensive chronic kidney disease with stage 5 chronic kidney disease or end stage renal disease: Secondary | ICD-10-CM | POA: Diagnosis not present

## 2021-04-03 DIAGNOSIS — Z7982 Long term (current) use of aspirin: Secondary | ICD-10-CM | POA: Insufficient documentation

## 2021-04-03 DIAGNOSIS — N186 End stage renal disease: Secondary | ICD-10-CM

## 2021-04-03 DIAGNOSIS — Z992 Dependence on renal dialysis: Secondary | ICD-10-CM | POA: Diagnosis not present

## 2021-04-03 DIAGNOSIS — R531 Weakness: Secondary | ICD-10-CM | POA: Diagnosis not present

## 2021-04-03 DIAGNOSIS — Z7951 Long term (current) use of inhaled steroids: Secondary | ICD-10-CM | POA: Insufficient documentation

## 2021-04-03 DIAGNOSIS — R0981 Nasal congestion: Secondary | ICD-10-CM | POA: Diagnosis present

## 2021-04-03 LAB — CBG MONITORING, ED: Glucose-Capillary: 141 mg/dL — ABNORMAL HIGH (ref 70–99)

## 2021-04-03 LAB — BASIC METABOLIC PANEL
Anion gap: 17 — ABNORMAL HIGH (ref 5–15)
BUN: 37 mg/dL — ABNORMAL HIGH (ref 8–23)
CO2: 24 mmol/L (ref 22–32)
Calcium: 9.7 mg/dL (ref 8.9–10.3)
Chloride: 93 mmol/L — ABNORMAL LOW (ref 98–111)
Creatinine, Ser: 12.4 mg/dL — ABNORMAL HIGH (ref 0.44–1.00)
GFR, Estimated: 3 mL/min — ABNORMAL LOW (ref >60)
Glucose, Bld: 131 mg/dL — ABNORMAL HIGH (ref 70–99)
Potassium: 2.9 mmol/L — ABNORMAL LOW (ref 3.5–5.1)
Sodium: 134 mmol/L — ABNORMAL LOW (ref 135–145)

## 2021-04-03 LAB — CBC
HCT: 33.3 % — ABNORMAL LOW (ref 36.0–46.0)
Hemoglobin: 11 g/dL — ABNORMAL LOW (ref 12.0–15.0)
MCH: 30.6 pg (ref 26.0–34.0)
MCHC: 33 g/dL (ref 30.0–36.0)
MCV: 92.8 fL (ref 80.0–100.0)
Platelets: 322 10*3/uL (ref 150–400)
RBC: 3.59 MIL/uL — ABNORMAL LOW (ref 3.87–5.11)
RDW: 13.6 % (ref 11.5–15.5)
WBC: 10.3 10*3/uL (ref 4.0–10.5)
nRBC: 0 % (ref 0.0–0.2)

## 2021-04-03 LAB — RESP PANEL BY RT-PCR (FLU A&B, COVID) ARPGX2
Influenza A by PCR: NEGATIVE
Influenza B by PCR: NEGATIVE
SARS Coronavirus 2 by RT PCR: NEGATIVE

## 2021-04-03 MED ORDER — BENZONATATE 100 MG PO CAPS: 100.0000 mg | ORAL_CAPSULE | Freq: Three times a day (TID) | ORAL | 0 refills | Status: DC

## 2021-04-03 MED ORDER — BENZONATATE 100 MG PO CAPS
100.0000 mg | ORAL_CAPSULE | Freq: Once | ORAL | Status: AC
  Administered 2021-04-03: 100 mg via ORAL
  Filled 2021-04-03: qty 1

## 2021-04-03 MED ORDER — SODIUM CHLORIDE 0.9 % IV BOLUS
500.0000 mL | Freq: Once | INTRAVENOUS | Status: AC
  Administered 2021-04-03: 500 mL via INTRAVENOUS

## 2021-04-03 MED ORDER — NETI POT SINUS WASH 2300-700 MG NA KIT: 1.0000 "application " | PACK | Freq: Two times a day (BID) | NASAL | 0 refills | Status: AC | PRN

## 2021-04-03 NOTE — ED Notes (Signed)
Pt reports not feeling well for 3 months, shortness of breath, fatigue, n/v/d, and low bp

## 2021-04-03 NOTE — ED Provider Notes (Signed)
Emergency Medicine Provider Triage Evaluation Note  [name] , a 75 y.o. female  was evaluated in triage.  Pt complains of weakness.  Review of Systems  Positive: Fatigue, dehydrated, post nasal drips, sinus congestion, nausea, vomit Negative: Fever, cough, sore throat  Physical Exam  BP 102/69 (BP Location: Left Arm)   Pulse 100   Temp 99.1 F (37.3 C) (Oral)   Resp 15   SpO2 100%  Gen:   Awake, no distress   Resp:  Normal effort  MSK:   Moves extremities without difficulty  Other:    Medical Decision Making  Medically screening exam initiated at 3:11 PM.  Appropriate orders placed.  [name] was informed that the remainder of the evaluation will be completed by another provider, this initial triage assessment does not replace that evaluation, and the importance of remaining in the ED until their evaluation is complete.  Pt report she has been feeling fatigue with post nasal drips, sinus congestion, vomiting up phlegm and decrease PO intake x 3 months.  Felt dehydrated. Sts she has been seen and evaluated multiple times for this without relief.  Have had amoxicillin as well as Zpak and was seen by ENT, and had CT scan showing chronic sinusitis according to pt.  Sxs not improving.  Felt weak.    Domenic Moras, PA-C 04/03/21 1519    Wyvonnia Dusky, MD 04/03/21 [phone]

## 2021-04-03 NOTE — ED Triage Notes (Addendum)
Pt was seen on 11/10 for feeling dehydrated d/t nausea w/ eating.  States she has had a sinus infection for several months and the phlegm makes her nauseated when she eats.  Same symptoms today.  States weakness is getting worse.

## 2021-04-03 NOTE — ED Provider Notes (Signed)
[facility] EMERGENCY DEPARTMENT Provider Note   CSN: [id number] Arrival date & time: 04/03/21  1454     History Chief Complaint  Patient presents with   Nausea    [name] is a 75 y.o. female.  Pt presents to the ED today with nausea and weakness and sinus drainage.  Pt said she's had a sinus infection for several weeks.  She has been on amox and zpack.  She has seen ENT who has recommended Flonase.  Pt said sx are not improving.  She feels weak.  She feels nauseated when she eats because of all her phlegm.  Pt said no one has told her to do saline rinses.  Pt is a peritoneal dialysis patient and has been doing her treatments.  CT sinuses from 11/9:  IMPRESSION: Normally aerated paranasal sinuses. Patent sinus drainage pathways.  Leftward deviation of the bony nasal septum with leftward bony spurring.      Past Medical History:  Diagnosis Date   Acute kidney injury ([place]) 07/20/2016   Allergic rhinitis    Anxiety    CAP (community acquired pneumonia) 06/2016   [name] 06/19/2016   Cervical disc disease    /notes 07/20/2016   Chest pain at rest 06/18/2012   COPD (chronic obstructive pulmonary disease) ([place]) 05/23/2012   Diverticulosis    DJD (degenerative joint disease)    Esophageal diverticulum    Esophageal stricture    ESRD (end stage renal disease) (HCC)    GERD (gastroesophageal reflux disease)    Hiatal hernia    History of blood transfusion 06/2016   "when I was hospitalized w/sepsis"   Hyperlipidemia 05/23/2012   T. Chol 234, LDL 130    Hypertension    IBS (irritable bowel syndrome)    Lung nodules    hx  with extensive workup including lung biopsy ruling out Sjogren's disease/notes 07/20/2016   Osteopenia    Recurrent sinusitis    /noters 07/20/2016   Renal insufficiency    Sinus headache    "daily lately" (07/20/2016)   Traumatic arthritis     Patient Active Problem List   Diagnosis Date Noted   Anaphylactic shock,  unspecified, initial encounter 01/14/2021   Postural dizziness with presyncope 08/20/2019   Orthostasis 08/18/2019   Protein-calorie malnutrition, severe 02/06/2018   Peritonitis ([place]) 02/05/2018   Hypokalemia 02/05/2018   Pulmonary nodules    ESRD (end stage renal disease) on dialysis ([place])    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   Hyperphosphatemia 07/25/2016   COPD (chronic obstructive pulmonary disease) ([place]) 05/23/2012   Acute respiratory failure with hypoxia ([place]) 05/23/2012   Essential hypertension 12/25/2007    Past Surgical History:  Procedure Laterality Date   ANTERIOR CERVICAL DECOMP/DISCECTOMY FUSION  2005   BACK SURGERY     COLONOSCOPY  2001   /notes 09/21/2010   CYSTECTOMY Bilateral    hx/notes 09/21/2010   IR GENERIC HISTORICAL  07/20/2016   IR FLUORO GUIDE CV LINE RIGHT 07/20/2016 [name], MD [facility]   IR GENERIC HISTORICAL  07/20/2016   IR US GUIDE VASC ACCESS RIGHT 07/20/2016 [name], MD [facility]   LUNG BIOPSY Left    left side, not cancerous, thoracotomy   TONSILLECTOMY AND ADENOIDECTOMY     hx/notes 09/21/2010   VAGINAL HYSTERECTOMY     hx w/oophorectomy/notes 09/21/2010     OB History   No obstetric history on file.     Family History  Problem Relation  Age of Onset   Throat cancer Father    Hypertension Sister    Thyroid disease Daughter    Hypertension Sister        x 2   Breast cancer Maternal Aunt    Blindness Sister        legally blind   Colon cancer Neg Hx    Stomach cancer Neg Hx     Social History   Tobacco Use   Smoking status: Former    Packs/day: 0.12    Years: 8.00    Pack years: 0.96    Types: Cigarettes    Quit date: 05/04/1985    Years since quitting: 35.9   Smokeless tobacco: Never  Vaping Use   Vaping Use: Never used  Substance Use Topics   Alcohol use: Yes    Alcohol/week: 1.0 standard drink    Types: 1 Cans of beer per week   Drug use: No     Home Medications Prior to Admission medications   Medication Sig Start Date End Date Taking? Authorizing Provider  benzonatate (TESSALON) 100 MG capsule Take 1 capsule (100 mg total) by mouth every 8 (eight) hours. 04/03/21  Yes Isla Pence, MD  Sodium Chloride-Sodium Bicarb (NETI POT SINUS [place]) 2300-700 MG KIT Place 1 application into the nose 2 (two) times daily as needed. 04/03/21  Yes Isla Pence, MD  acetaminophen (TYLENOL) 500 MG tablet Take 1,000 mg by mouth every 6 (six) hours as needed (for headaches or pain).     [provider]  albuterol (VENTOLIN HFA) 108 (90 Base) MCG/ACT inhaler Inhale 1-2 puffs into the lungs every 6 (six) hours as needed for wheezing or shortness of breath. 12/16/19   Parrett, Fonnie Mu, NP  amoxicillin-clavulanate (AUGMENTIN) 875-125 MG tablet Take 1 tablet by mouth every 12 (twelve) hours. Patient not taking: Reported on 03/19/2021 02/17/21   Sherrell Puller, PA-C  aspirin EC 81 MG tablet Take 81 mg by mouth daily.    [provider]  atorvastatin (LIPITOR) 10 MG tablet Take 1 tablet (10 mg total) by mouth daily. Patient not taking: Reported on 03/19/2021 10/20/15   Biagio Borg, MD  atorvastatin (LIPITOR) 20 MG tablet Take 20 mg by mouth daily. 02/24/21   [provider]  BREO ELLIPTA 100-25 MCG/INH AEPB Inhale 1 puff into the lungs daily.  06/30/19   [provider]  calcitRIOL (ROCALTROL) 0.25 MCG capsule Take 0.25 mcg by mouth daily.  03/14/19   [provider]  COLACE 100 MG capsule Take 100 mg by mouth daily as needed for mild constipation.  03/14/19   [provider]  cyclobenzaprine (FLEXERIL) 5 MG tablet Take 5 mg by mouth 3 (three) times daily as needed for muscle spasms. 01/15/21   [provider]  ergocalciferol (VITAMIN D2) 1.25 MG (50000 UT) capsule Take 50,000 Units by mouth every Sunday.    [provider]  ferric citrate (AURYXIA) 1 GM 210 MG(Fe) tablet Take 210 mg by  mouth 3 (three) times daily with meals.    [provider]  fluticasone (FLONASE) 50 MCG/ACT nasal spray Place 1 spray into both nostrils daily as needed for allergies or rhinitis. 01/15/21   [provider]  lactulose (CHRONULAC) 10 GM/15ML solution Take 10 g by mouth daily as needed for mild constipation.    [provider]  loratadine-pseudoephedrine (CLARITIN-D 12 HOUR) 5-120 MG tablet Take 1 tablet by mouth 2 (two) times daily as needed for allergies.    [provider]  Melatonin 5 MG TABS Take 5 mg by mouth at bedtime as needed (for sleep).     [provider]  midodrine (PROAMATINE) 5 MG tablet Take 1 tablet (5 mg total) by mouth 3 (three) times daily with meals. Patient not taking: No sig reported 08/21/19   Damita Lack, MD  multivitamin (RENA-VIT) TABS tablet Take 1 tablet by mouth daily.    [provider]  pantoprazole (PROTONIX) 40 MG tablet Take 1 tablet (40 mg total) by mouth daily. 10/20/15   Biagio Borg, MD  Potassium Chloride ER 20 MEQ TBCR Take 20 mEq by mouth daily.  05/24/19   [provider]  sodium chloride (OCEAN) 0.65 % SOLN nasal spray Place 2 sprays into both nostrils daily as needed for congestion.     [provider]  torsemide (DEMADEX) 20 MG tablet Take 20 mg by mouth daily.  01/03/18   [provider]    Allergies    Ace inhibitors, Calcium channel blockers, Diltiazem, Other, Pneumococcal vaccines, Tetanus toxoid, Latex, and Tape  Review of Systems   Review of Systems  HENT:  Positive for congestion and postnasal drip.   Respiratory:  Positive for shortness of breath.   All other systems reviewed and are negative.  Physical Exam Updated Vital Signs BP 129/79   Pulse 88   Temp 98.1 F (36.7 C) (Oral)   Resp 14   Ht 5' (1.524 m)   Wt 42.2 kg   SpO2 100%   BMI 18.16 kg/m   Physical Exam Vitals and nursing note reviewed.  Constitutional:      Appearance: Normal  appearance.  HENT:     Head: Normocephalic and atraumatic.     Right Ear: External ear normal.     Left Ear: External ear normal.     Nose: Nose normal.     Mouth/Throat:     Mouth: Mucous membranes are dry.  Eyes:     Extraocular Movements: Extraocular movements intact.     Conjunctiva/sclera: Conjunctivae normal.     Pupils: Pupils are equal, round, and reactive to light.  Cardiovascular:     Rate and Rhythm: Normal rate and regular rhythm.     Pulses: Normal pulses.     Heart sounds: Normal heart sounds.  Pulmonary:     Effort: Pulmonary effort is normal.     Breath sounds: Normal breath sounds.  Abdominal:     General: Abdomen is flat. Bowel sounds are normal.     Palpations: Abdomen is soft.     Comments: Peritoneal dialysis catheter noted  Musculoskeletal:        General: Normal range of motion.     Cervical back: Normal range of motion and neck supple.  Skin:    General: Skin is warm.     Capillary Refill: Capillary refill takes less than 2 seconds.  Neurological:     General: No focal deficit present.     Mental Status: She is alert and oriented to person, place, and time.  Psychiatric:        Mood and Affect: Mood normal.        Behavior: Behavior normal.    ED Results / Procedures / Treatments   Labs (all labs ordered are listed, but only abnormal results are displayed) Labs Reviewed  BASIC METABOLIC PANEL - Abnormal; Notable for the following components:      Result Value   Sodium 134 (*)    Potassium 2.9 (*)    Chloride  93 (*)    Glucose, Bld 131 (*)    BUN 37 (*)    Creatinine, Ser 12.40 (*)    GFR, Estimated 3 (*)    Anion gap 17 (*)    All other components within normal limits  CBC - Abnormal; Notable for the following components:   RBC 3.59 (*)    Hemoglobin 11.0 (*)    HCT 33.3 (*)    All other components within normal limits  CBG MONITORING, ED - Abnormal; Notable for the following components:   Glucose-Capillary 141 (*)    All other  components within normal limits  RESP PANEL BY RT-PCR (FLU A&B, COVID) [id number]  URINALYSIS, ROUTINE W REFLEX MICROSCOPIC    EKG EKG Interpretation  Date/Time:  Saturday April 03 2021 16:04:04 EST Ventricular Rate:  91 PR Interval:  140 QRS Duration: 87 QT Interval:  346 QTC Calculation: 426 R Axis:   -45 Text Interpretation: Sinus rhythm Left anterior fascicular block Low voltage, precordial leads Abnormal R-wave progression, late transition Nonspecific T abnrm, anterolateral leads No significant change since last tracing Confirmed by Isla Pence [phone]) on 04/03/2021 4:06:23 PM  Radiology DG Chest Portable 1 View  Result Date: 04/03/2021 CLINICAL DATA:  Fatigue, postnasal drip, sinus congestion, vomiting up phlegm, shortness of breath, and hypotension for 3 months, on dialysis, history COPD, hypertension, former smoker EXAM: PORTABLE CHEST 1 VIEW COMPARISON:  Portable exam 1612 hours compared to 03/18/2021 FINDINGS: Normal heart size, mediastinal contours, and pulmonary vascularity. Atherosclerotic calcification aorta. Postsurgical changes of the LEFT lung. Underlying emphysematous changes and central peribronchial thickening consistent with COPD. No acute infiltrate, pleural effusion, or pneumothorax. Bones demineralized with levoconvex thoracic scoliosis and prior cervical fusion. IMPRESSION: COPD changes with postsurgical changes of the LEFT lung. No acute abnormalities. Aortic Atherosclerosis (ICD10-I70.0) and Emphysema (ICD10-J43.9). Electronically Signed   By: Lavonia Dana M.D.   On: 04/03/2021 16:38    Procedures Procedures   Medications Ordered in ED Medications  sodium chloride 0.9 % bolus 500 mL (500 mLs Intravenous New Bag/Given 04/03/21 1640)  benzonatate (TESSALON) capsule 100 mg (100 mg Oral Given 04/03/21 1629)    ED Course  I have reviewed the triage vital signs and the nursing notes.  Pertinent labs & imaging results that were available during my care of the  patient were reviewed by me and considered in my medical decision making (see chart for details).    MDM Rules/Calculators/A&P                           CXR shows nothing acute.  Labs show CKD.  K is also low, but she has just finished her dialysis.  Covid/flu neg.  Pt given tessalon which did help.  She is given a rx for a Neti pot.  She is instructed to return if worse.  F/u with ENT. Final Clinical Impression(s) / ED Diagnoses Final diagnoses:  Chronic sinusitis, unspecified location  Hypokalemia  ESRD on peritoneal dialysis [facility])    Rx / DC Orders ED Discharge Orders          Ordered    Sodium Chloride-Sodium Bicarb (NETI POT SINUS WASH) 2300-700 MG KIT  2 times daily PRN        04/03/21 1730    benzonatate (TESSALON) 100 MG capsule  Every 8 hours        04/03/21 1730             Isla Pence, MD 04/03/21  1732  

## 2021-04-12 ENCOUNTER — Emergency Department (HOSPITAL_COMMUNITY)
Admission: EM | Admit: 2021-04-12 | Discharge: 2021-04-13 | Disposition: A | Discharge status: Home / Self Care | Payer: Medicare HMO | Attending: Emergency Medicine | Admitting: Emergency Medicine

## 2021-04-12 ENCOUNTER — Emergency Department (HOSPITAL_COMMUNITY): Payer: Medicare HMO

## 2021-04-12 DIAGNOSIS — R1310 Dysphagia, unspecified: Secondary | ICD-10-CM | POA: Diagnosis not present

## 2021-04-12 DIAGNOSIS — E162 Hypoglycemia, unspecified: Secondary | ICD-10-CM

## 2021-04-12 DIAGNOSIS — N186 End stage renal disease: Secondary | ICD-10-CM | POA: Insufficient documentation

## 2021-04-12 DIAGNOSIS — Z91040 Latex allergy status: Secondary | ICD-10-CM | POA: Diagnosis not present

## 2021-04-12 DIAGNOSIS — I120 Hypertensive chronic kidney disease with stage 5 chronic kidney disease or end stage renal disease: Secondary | ICD-10-CM | POA: Insufficient documentation

## 2021-04-12 DIAGNOSIS — Z79899 Other long term (current) drug therapy: Secondary | ICD-10-CM | POA: Diagnosis not present

## 2021-04-12 DIAGNOSIS — Z992 Dependence on renal dialysis: Secondary | ICD-10-CM | POA: Insufficient documentation

## 2021-04-12 DIAGNOSIS — E876 Hypokalemia: Secondary | ICD-10-CM

## 2021-04-12 DIAGNOSIS — R5381 Other malaise: Secondary | ICD-10-CM | POA: Diagnosis not present

## 2021-04-12 DIAGNOSIS — Z7982 Long term (current) use of aspirin: Secondary | ICD-10-CM | POA: Insufficient documentation

## 2021-04-12 DIAGNOSIS — Z87891 Personal history of nicotine dependence: Secondary | ICD-10-CM | POA: Insufficient documentation

## 2021-04-12 DIAGNOSIS — J449 Chronic obstructive pulmonary disease, unspecified: Secondary | ICD-10-CM | POA: Insufficient documentation

## 2021-04-12 DIAGNOSIS — R5383 Other fatigue: Secondary | ICD-10-CM | POA: Insufficient documentation

## 2021-04-12 DIAGNOSIS — R531 Weakness: Secondary | ICD-10-CM

## 2021-04-12 LAB — COMPREHENSIVE METABOLIC PANEL
ALT: 25 U/L (ref 0–44)
AST: 47 U/L — ABNORMAL HIGH (ref 15–41)
Albumin: 3.2 g/dL — ABNORMAL LOW (ref 3.5–5.0)
Alkaline Phosphatase: 75 U/L (ref 38–126)
Anion gap: 17 — ABNORMAL HIGH (ref 5–15)
BUN: 37 mg/dL — ABNORMAL HIGH (ref 8–23)
CO2: 21 mmol/L — ABNORMAL LOW (ref 22–32)
Calcium: 8.9 mg/dL (ref 8.9–10.3)
Chloride: 95 mmol/L — ABNORMAL LOW (ref 98–111)
Creatinine, Ser: 14.2 mg/dL — ABNORMAL HIGH (ref 0.44–1.00)
GFR, Estimated: 2 mL/min — ABNORMAL LOW (ref >60)
Glucose, Bld: 61 mg/dL — ABNORMAL LOW (ref 70–99)
Potassium: 2.7 mmol/L — CL (ref 3.5–5.1)
Sodium: 133 mmol/L — ABNORMAL LOW (ref 135–145)
Total Bilirubin: 1.6 mg/dL — ABNORMAL HIGH (ref 0.3–1.2)
Total Protein: 6.5 g/dL (ref 6.5–8.1)

## 2021-04-12 LAB — CBC WITH DIFFERENTIAL/PLATELET
Abs Immature Granulocytes: 0.03 10*3/uL (ref 0.00–0.07)
Basophils Absolute: 0.1 10*3/uL (ref 0.0–0.1)
Basophils Relative: 2 %
Eosinophils Absolute: 0.1 10*3/uL (ref 0.0–0.5)
Eosinophils Relative: 1 %
HCT: 33.7 % — ABNORMAL LOW (ref 36.0–46.0)
Hemoglobin: 11.3 g/dL — ABNORMAL LOW (ref 12.0–15.0)
Immature Granulocytes: 1 %
Lymphocytes Relative: 17 %
Lymphs Abs: 1 10*3/uL (ref 0.7–4.0)
MCH: 31.8 pg (ref 26.0–34.0)
MCHC: 33.5 g/dL (ref 30.0–36.0)
MCV: 94.9 fL (ref 80.0–100.0)
Monocytes Absolute: 0.4 10*3/uL (ref 0.1–1.0)
Monocytes Relative: 7 %
Neutro Abs: 4.3 10*3/uL (ref 1.7–7.7)
Neutrophils Relative %: 72 %
Platelets: 286 10*3/uL (ref 150–400)
RBC: 3.55 MIL/uL — ABNORMAL LOW (ref 3.87–5.11)
RDW: 13.9 % (ref 11.5–15.5)
WBC: 6 10*3/uL (ref 4.0–10.5)
nRBC: 0 % (ref 0.0–0.2)

## 2021-04-12 LAB — CBG MONITORING, ED
Glucose-Capillary: 170 mg/dL — ABNORMAL HIGH (ref 70–99)
Glucose-Capillary: 215 mg/dL — ABNORMAL HIGH (ref 70–99)
Glucose-Capillary: 29 mg/dL — CL (ref 70–99)

## 2021-04-12 LAB — LACTIC ACID, PLASMA: Lactic Acid, Venous: 1.8 mmol/L (ref 0.5–1.9)

## 2021-04-12 MED ORDER — DEXTROSE 50 % IV SOLN: 25.0000 g | Freq: Once | INTRAVENOUS | Status: AC

## 2021-04-12 MED ORDER — POTASSIUM CHLORIDE 10 MEQ/100ML IV SOLN
10.0000 meq | Freq: Once | INTRAVENOUS | Status: AC
  Administered 2021-04-12: 10 meq via INTRAVENOUS
  Filled 2021-04-12: qty 100

## 2021-04-12 MED ORDER — DEXTROSE 50 % IV SOLN
INTRAVENOUS | Status: AC
  Administered 2021-04-12: 25 g via INTRAVENOUS
  Filled 2021-04-12: qty 50

## 2021-04-12 MED ORDER — SODIUM CHLORIDE 0.9 % IV BOLUS
500.0000 mL | Freq: Once | INTRAVENOUS | Status: AC
  Administered 2021-04-12: 500 mL via INTRAVENOUS

## 2021-04-12 NOTE — Discharge Instructions (Addendum)
Return for any problem.  ?

## 2021-04-12 NOTE — ED Triage Notes (Signed)
Pt. Stated, [name] had some hypotension and some difficulty swallowing for a month.

## 2021-04-12 NOTE — ED Triage Notes (Signed)
Pt. Stated, When I stand up my back hurts and can't hardly walk and I get dizzy.

## 2021-04-12 NOTE — ED Provider Notes (Addendum)
[facility] EMERGENCY DEPARTMENT Provider Note   CSN: [id number] Arrival date & time: 04/12/21  1314     History Chief Complaint  Patient presents with   Hypotension   Dysphagia    [name] is a 75 y.o. female.  75-year-old female with prior medical history as detailed below presents for evaluation.  Patient reports several weeks of feeling weak and fatigued.  Patient reports that sometimes when she tries to eat she feels like the food gets stuck in her esophagus.  She complains of malaise.  All reported symptoms have been ongoing for at least the last several weeks.  Patient is on peritoneal dialysis.  She performs this at home every evening.  She denies abdominal pain.  She denies fever.  She denies recent nausea or vomiting.  She is specifically concerned that her potassium might be low.  She also is requesting some IV fluids.  The history is provided by the patient.  Illness Location:  Weakness, fatigue, concern regarding hypokalemia Severity:  Mild Onset quality:  Gradual Duration:  4 weeks Timing:  Constant Progression:  Waxing and waning Chronicity:  Chronic     Past Medical History:  Diagnosis Date   Acute kidney injury (HCC) 07/20/2016   Allergic rhinitis    Anxiety    CAP (community acquired pneumonia) 06/2016   /noters 06/19/2016   Cervical disc disease    /notes 07/20/2016   Chest pain at rest 06/18/2012   COPD (chronic obstructive pulmonary disease) (HCC) 05/23/2012   Diverticulosis    DJD (degenerative joint disease)    Esophageal diverticulum    Esophageal stricture    ESRD (end stage renal disease) (HCC)    GERD (gastroesophageal reflux disease)    Hiatal hernia    History of blood transfusion 06/2016   "when I was hospitalized w/sepsis"   Hyperlipidemia 05/23/2012   T. Chol 234, LDL 130    Hypertension    IBS (irritable bowel syndrome)    Lung nodules    hx  with extensive workup including lung biopsy ruling out  Sjogren's disease/notes 07/20/2016   Osteopenia    Recurrent sinusitis    /noters 07/20/2016   Renal insufficiency    Sinus headache    "daily lately" (07/20/2016)   Traumatic arthritis     Patient Active Problem List   Diagnosis Date Noted   Anaphylactic shock, unspecified, initial encounter 01/14/2021   Postural dizziness with presyncope 08/20/2019   Orthostasis 08/18/2019   Protein-calorie malnutrition, severe 02/06/2018   Peritonitis (HCC) 02/05/2018   Hypokalemia 02/05/2018   Pulmonary nodules    ESRD (end stage renal disease) on dialysis (HCC)    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   Hyperphosphatemia 07/25/2016   COPD (chronic obstructive pulmonary disease) (HCC) 05/23/2012   Acute respiratory failure with hypoxia (HCC) 05/23/2012   Essential hypertension 12/25/2007    Past Surgical History:  Procedure Laterality Date   ANTERIOR CERVICAL DECOMP/DISCECTOMY FUSION  2005   BACK SURGERY     COLONOSCOPY  2001   /notes 09/21/2010   CYSTECTOMY Bilateral    hx/notes 09/21/2010   IR GENERIC HISTORICAL  07/20/2016   IR FLUORO GUIDE CV LINE RIGHT 07/20/2016 John Watts, MD [facility]   IR GENERIC HISTORICAL  07/20/2016   IR US GUIDE VASC ACCESS RIGHT 07/20/2016 John Watts, MD [facility]   LUNG BIOPSY Left    left side, not cancerous, thoracotomy   TONSILLECTOMY AND ADENOIDECTOMY       hx/notes 09/21/2010   VAGINAL HYSTERECTOMY     hx w/oophorectomy/notes 09/21/2010     OB History   No obstetric history on file.     Family History  Problem Relation Age of Onset   Throat cancer Father    Hypertension Sister    Thyroid disease Daughter    Hypertension Sister        x 2   Breast cancer Maternal Aunt    Blindness Sister        legally blind   Colon cancer Neg Hx    Stomach cancer Neg Hx     Social History   Tobacco Use   Smoking status: Former    Packs/day: 0.12    Years: 8.00    Pack years: 0.96     Types: Cigarettes    Quit date: 05/04/1985    Years since quitting: 35.9   Smokeless tobacco: Never  Vaping Use   Vaping Use: Never used  Substance Use Topics   Alcohol use: Yes    Alcohol/week: 1.0 standard drink    Types: 1 Cans of beer per week   Drug use: No    Home Medications Prior to Admission medications   Medication Sig Start Date End Date Taking? Authorizing Provider  acetaminophen (TYLENOL) 500 MG tablet Take 1,000 mg by mouth every 6 (six) hours as needed (for headaches or pain).     [provider]  albuterol (VENTOLIN HFA) 108 (90 Base) MCG/ACT inhaler Inhale 1-2 puffs into the lungs every 6 (six) hours as needed for wheezing or shortness of breath. 12/16/19   Parrett, Tammy S, NP  amoxicillin-clavulanate (AUGMENTIN) 875-125 MG tablet Take 1 tablet by mouth every 12 (twelve) hours. Patient not taking: Reported on 03/19/2021 02/17/21   Ransom, Riley, PA-C  aspirin EC 81 MG tablet Take 81 mg by mouth daily.    [provider]  atorvastatin (LIPITOR) 10 MG tablet Take 1 tablet (10 mg total) by mouth daily. Patient not taking: Reported on 03/19/2021 10/20/15   John, James W, MD  atorvastatin (LIPITOR) 20 MG tablet Take 20 mg by mouth daily. 02/24/21   [provider]  benzonatate (TESSALON) 100 MG capsule Take 1 capsule (100 mg total) by mouth every 8 (eight) hours. 04/03/21   Haviland, Julie, MD  BREO ELLIPTA 100-25 MCG/INH AEPB Inhale 1 puff into the lungs daily.  06/30/19   [provider]  calcitRIOL (ROCALTROL) 0.25 MCG capsule Take 0.25 mcg by mouth daily.  03/14/19   [provider]  COLACE 100 MG capsule Take 100 mg by mouth daily as needed for mild constipation.  03/14/19   [provider]  cyclobenzaprine (FLEXERIL) 5 MG tablet Take 5 mg by mouth 3 (three) times daily as needed for muscle spasms. 01/15/21   [provider]  ergocalciferol (VITAMIN D2) 1.25 MG (50000 UT) capsule Take 50,000 Units by mouth every  Sunday.    [provider]  ferric citrate (AURYXIA) 1 GM 210 MG(Fe) tablet Take 210 mg by mouth 3 (three) times daily with meals.    [provider]  fluticasone (FLONASE) 50 MCG/ACT nasal spray Place 1 spray into both nostrils daily as needed for allergies or rhinitis. 01/15/21   [provider]  lactulose (CHRONULAC) 10 GM/15ML solution Take 10 g by mouth daily as needed for mild constipation.    [provider]  loratadine-pseudoephedrine (CLARITIN-D 12 HOUR) 5-120 MG tablet Take 1 tablet by mouth 2 (two) times daily as needed for allergies.      [provider]  Melatonin 5 MG TABS Take 5 mg by mouth at bedtime as needed (for sleep).     [provider]  midodrine (PROAMATINE) 5 MG tablet Take 1 tablet (5 mg total) by mouth 3 (three) times daily with meals. Patient not taking: No sig reported 08/21/19   Damita Lack, MD  multivitamin (RENA-VIT) TABS tablet Take 1 tablet by mouth daily.    [provider]  pantoprazole (PROTONIX) 40 MG tablet Take 1 tablet (40 mg total) by mouth daily. 10/20/15   Biagio Borg, MD  Potassium Chloride ER 20 MEQ TBCR Take 20 mEq by mouth daily.  05/24/19   [provider]  sodium chloride (OCEAN) 0.65 % SOLN nasal spray Place 2 sprays into both nostrils daily as needed for congestion.     [provider]  Sodium Chloride-Sodium Bicarb (NETI POT SINUS [place]) 2300-700 MG KIT Place 1 application into the nose 2 (two) times daily as needed. 04/03/21   Isla Pence, MD  torsemide (DEMADEX) 20 MG tablet Take 20 mg by mouth daily.  01/03/18   [provider]    Allergies    Ace inhibitors, Calcium channel blockers, Diltiazem, Other, Pneumococcal vaccines, Tetanus toxoid, Latex, and Tape  Review of Systems   Review of Systems  All other systems reviewed and are negative.  Physical Exam Updated Vital Signs BP 90/61   Pulse 76   Temp 98.4 F (36.9 C)   Resp 17   SpO2 98%    Physical Exam Vitals and nursing note reviewed.  Constitutional:      General: She is not in acute distress.    Appearance: Normal appearance. She is well-developed.  HENT:     Head: Normocephalic and atraumatic.  Eyes:     Conjunctiva/sclera: Conjunctivae normal.     Pupils: Pupils are equal, round, and reactive to light.  Cardiovascular:     Rate and Rhythm: Normal rate and regular rhythm.     Heart sounds: Normal heart sounds.  Pulmonary:     Effort: Pulmonary effort is normal. No respiratory distress.     Breath sounds: Normal breath sounds.  Abdominal:     General: There is no distension.     Palpations: Abdomen is soft.     Tenderness: There is no abdominal tenderness.     Comments: PD catheter in place.  Abdomen is soft and nontender.  Musculoskeletal:        General: No deformity. Normal range of motion.     Cervical back: Normal range of motion and neck supple.  Skin:    General: Skin is warm and dry.  Neurological:     General: No focal deficit present.     Mental Status: She is alert and oriented to person, place, and time.    ED Results / Procedures / Treatments   Labs (all labs ordered are listed, but only abnormal results are displayed) Labs Reviewed  COMPREHENSIVE METABOLIC PANEL - Abnormal; Notable for the following components:      Result Value   Sodium 133 (*)    Potassium 2.7 (*)    Chloride 95 (*)    CO2 21 (*)    Glucose, Bld 61 (*)    BUN 37 (*)    Creatinine, Ser 14.20 (*)    Albumin 3.2 (*)    AST 47 (*)    Total Bilirubin 1.6 (*)    GFR, Estimated 2 (*)    Anion gap 17 (*)  All other components within normal limits  CBC WITH DIFFERENTIAL/PLATELET - Abnormal; Notable for the following components:   RBC 3.55 (*)    Hemoglobin 11.3 (*)    HCT 33.7 (*)    All other components within normal limits  LACTIC ACID, PLASMA  LACTIC ACID, PLASMA  URINALYSIS, ROUTINE W REFLEX MICROSCOPIC  CBG MONITORING, ED    EKG None  Radiology DG  Chest 2 View  Result Date: 04/12/2021 CLINICAL DATA:  Hypotension EXAM: CHEST - 2 VIEW COMPARISON:  Previous studies including the examination of 04/03/2021 FINDINGS: Cardiac size is within normal limits. Low position of diaphragms and increase in AP diameter of chest suggests COPD. Surgical staples are seen in the left lung. There are subtle increased interstitial markings in the both upper lung fields with no significant change. There is no new focal pulmonary consolidation. There is blunting of left lateral CP angle. Left hemidiaphragm is elevated. There is no pneumothorax. IMPRESSION: COPD.  There are no new infiltrates or signs of pulmonary edema. Electronically Signed   By: Elmer Picker M.D.   On: 04/12/2021 14:01    Procedures Procedures   Medications Ordered in ED Medications  sodium chloride 0.9 % bolus 500 mL (500 mLs Intravenous New Bag/Given 04/12/21 1938)  potassium chloride 10 mEq in 100 mL IVPB (0 mEq Intravenous Stopped 04/12/21 2040)    ED Course  I have reviewed the triage vital signs and the nursing notes.  Pertinent labs & imaging results that were available during my care of the patient were reviewed by me and considered in my medical decision making (see chart for details).    MDM Rules/Calculators/A&P                           MDM  MSE complete  LACORA FOLMER was evaluated in Emergency Department on 04/12/2021 for the symptoms described in the history of present illness. She was evaluated in the context of the global COVID-19 pandemic, which necessitated consideration that the patient might be at risk for infection with the SARS-CoV-2 virus that causes COVID-19. Institutional protocols and algorithms that pertain to the evaluation of patients at risk for COVID-19 are in a state of rapid change based on information released by regulatory bodies including the CDC and federal and state organizations. These policies and algorithms were followed during the  patient's care in the ED.  Patient presented with complaints of chronic malaise, fatigue, and weakness.  Patient without significant acute symptoms.  Work-up was demonstrative of chronic renal insufficiency and mild hypokalemia.  Patient does feel improved after administration of small amount IV fluids and IV potassium.  Patient is planning on going home and initiating her nightly PD.  She reports that she has already scheduled close follow-up with nephrology later this week.  Patient with complaints of chronic dysphagia.  She is requesting referral for GI evaluation.  Patient offered admission or lengthier observation here in the ED.  She declined same.  She desires discharge home now.  Importance of close follow-up is stressed.  Strict return precautions given and understood.  2200 Addendum.  Patient was being prepared for discharge.  Patient without significant complaint.  Accu-Chek revealed sugar of 29.  Glucose administered.  2315 Patient comfortable - Desires DC home. Low BG corrected. Patient taking good PO. Appropriate for DC.  Final Clinical Impression(s) / ED Diagnoses Final diagnoses:  Weakness  Hypokalemia  Dysphagia, unspecified type    Rx / DC  Orders ED Discharge Orders     None        Valarie Merino, MD 04/12/21 2143    Valarie Merino, MD 04/14/21 [phone]

## 2021-04-12 NOTE — ED Triage Notes (Signed)
Pt. Stated, the medicine that they gave me for low BP makes me throw up.

## 2021-04-12 NOTE — ED Notes (Signed)
Pt remains A/Ox4 despite CBG. Pt provided with 8 oz of juice and graham crackers with peanut butter. EDP notified CBG.

## 2021-04-12 NOTE — ED Provider Notes (Incomplete Revision)
[facility] EMERGENCY DEPARTMENT Provider Note   CSN: [id number] Arrival date & time: 04/12/21  1314     History Chief Complaint  Patient presents with   Hypotension   Dysphagia    [name] is a 75 y.o. female.  75 year old female with prior medical history as detailed below presents for evaluation.  Patient reports several weeks of feeling weak and fatigued.  Patient reports that sometimes when she tries to eat she feels like the food gets stuck in her esophagus.  She complains of malaise.  All reported symptoms have been ongoing for at least the last several weeks.  Patient is on peritoneal dialysis.  She performs this at home every evening.  She denies abdominal pain.  She denies fever.  She denies recent nausea or vomiting.  She is specifically concerned that her potassium might be low.  She also is requesting some IV fluids.  The history is provided by the patient.  Illness Location:  Weakness, fatigue, concern regarding hypokalemia Severity:  Mild Onset quality:  Gradual Duration:  4 weeks Timing:  Constant Progression:  Waxing and waning Chronicity:  Chronic     Past Medical History:  Diagnosis Date   Acute kidney injury ([place]) 07/20/2016   Allergic rhinitis    Anxiety    CAP (community acquired pneumonia) 06/2016   [name] 06/19/2016   Cervical disc disease    /notes 07/20/2016   Chest pain at rest 06/18/2012   COPD (chronic obstructive pulmonary disease) ([place]) 05/23/2012   Diverticulosis    DJD (degenerative joint disease)    Esophageal diverticulum    Esophageal stricture    ESRD (end stage renal disease) ([facility])    GERD (gastroesophageal reflux disease)    Hiatal hernia    History of blood transfusion 06/2016   "when I was hospitalized w/sepsis"   Hyperlipidemia 05/23/2012   T. Chol 234, LDL 130    Hypertension    IBS (irritable bowel syndrome)    Lung nodules    hx  with extensive workup including lung biopsy ruling out  Sjogren's disease/notes 07/20/2016   Osteopenia    Recurrent sinusitis    /noters 07/20/2016   Renal insufficiency    Sinus headache    "daily lately" (07/20/2016)   Traumatic arthritis     Patient Active Problem List   Diagnosis Date Noted   Anaphylactic shock, unspecified, initial encounter 01/14/2021   Postural dizziness with presyncope 08/20/2019   Orthostasis 08/18/2019   Protein-calorie malnutrition, severe 02/06/2018   Peritonitis ([place]) 02/05/2018   Hypokalemia 02/05/2018   Pulmonary nodules    ESRD (end stage renal disease) on dialysis (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   Hyperphosphatemia 07/25/2016   COPD (chronic obstructive pulmonary disease) ([place]) 05/23/2012   Acute respiratory failure with hypoxia ([place]) 05/23/2012   Essential hypertension 12/25/2007    Past Surgical History:  Procedure Laterality Date   ANTERIOR CERVICAL DECOMP/DISCECTOMY FUSION  2005   BACK SURGERY     COLONOSCOPY  2001   /notes 09/21/2010   CYSTECTOMY Bilateral    hx/notes 09/21/2010   IR GENERIC HISTORICAL  07/20/2016   IR FLUORO GUIDE CV LINE RIGHT 07/20/2016 [name], MD [facility]   IR GENERIC HISTORICAL  07/20/2016   IR US GUIDE VASC ACCESS RIGHT 07/20/2016 [name], MD [facility]   LUNG BIOPSY Left    left side, not cancerous, thoracotomy   TONSILLECTOMY AND ADENOIDECTOMY  hx/notes 09/21/2010   VAGINAL HYSTERECTOMY     hx w/oophorectomy/notes 09/21/2010     OB History   No obstetric history on file.     Family History  Problem Relation Age of Onset   Throat cancer Father    Hypertension Sister    Thyroid disease Daughter    Hypertension Sister        x 2   Breast cancer Maternal Aunt    Blindness Sister        legally blind   Colon cancer Neg Hx    Stomach cancer Neg Hx     Social History   Tobacco Use   Smoking status: Former    Packs/day: 0.12    Years: 8.00    Pack years: 0.96     Types: Cigarettes    Quit date: 05/04/1985    Years since quitting: 35.9   Smokeless tobacco: Never  Vaping Use   Vaping Use: Never used  Substance Use Topics   Alcohol use: Yes    Alcohol/week: 1.0 standard drink    Types: 1 Cans of beer per week   Drug use: No    Home Medications Prior to Admission medications   Medication Sig Start Date End Date Taking? Authorizing Provider  acetaminophen (TYLENOL) 500 MG tablet Take 1,000 mg by mouth every 6 (six) hours as needed (for headaches or pain).     [provider]  albuterol (VENTOLIN HFA) 108 (90 Base) MCG/ACT inhaler Inhale 1-2 puffs into the lungs every 6 (six) hours as needed for wheezing or shortness of breath. 12/16/19   Parrett, Fonnie Mu, NP  amoxicillin-clavulanate (AUGMENTIN) 875-125 MG tablet Take 1 tablet by mouth every 12 (twelve) hours. Patient not taking: Reported on 03/19/2021 02/17/21   Sherrell Puller, PA-C  aspirin EC 81 MG tablet Take 81 mg by mouth daily.    [provider]  atorvastatin (LIPITOR) 10 MG tablet Take 1 tablet (10 mg total) by mouth daily. Patient not taking: Reported on 03/19/2021 10/20/15   Biagio Borg, MD  atorvastatin (LIPITOR) 20 MG tablet Take 20 mg by mouth daily. 02/24/21   [provider]  benzonatate (TESSALON) 100 MG capsule Take 1 capsule (100 mg total) by mouth every 8 (eight) hours. 04/03/21   Isla Pence, MD  BREO ELLIPTA 100-25 MCG/INH AEPB Inhale 1 puff into the lungs daily.  06/30/19   [provider]  calcitRIOL (ROCALTROL) 0.25 MCG capsule Take 0.25 mcg by mouth daily.  03/14/19   [provider]  COLACE 100 MG capsule Take 100 mg by mouth daily as needed for mild constipation.  03/14/19   [provider]  cyclobenzaprine (FLEXERIL) 5 MG tablet Take 5 mg by mouth 3 (three) times daily as needed for muscle spasms. 01/15/21   [provider]  ergocalciferol (VITAMIN D2) 1.25 MG (50000 UT) capsule Take 50,000 Units by mouth every  Sunday.    [provider]  ferric citrate (AURYXIA) 1 GM 210 MG(Fe) tablet Take 210 mg by mouth 3 (three) times daily with meals.    [provider]  fluticasone (FLONASE) 50 MCG/ACT nasal spray Place 1 spray into both nostrils daily as needed for allergies or rhinitis. 01/15/21   [provider]  lactulose (CHRONULAC) 10 GM/15ML solution Take 10 g by mouth daily as needed for mild constipation.    [provider]  loratadine-pseudoephedrine (CLARITIN-D 12 HOUR) 5-120 MG tablet Take 1 tablet by mouth 2 (two) times daily as needed for allergies.  [provider]  Melatonin 5 MG TABS Take 5 mg by mouth at bedtime as needed (for sleep).     [provider]  midodrine (PROAMATINE) 5 MG tablet Take 1 tablet (5 mg total) by mouth 3 (three) times daily with meals. Patient not taking: No sig reported 08/21/19   Damita Lack, MD  multivitamin (RENA-VIT) TABS tablet Take 1 tablet by mouth daily.    [provider]  pantoprazole (PROTONIX) 40 MG tablet Take 1 tablet (40 mg total) by mouth daily. 10/20/15   Biagio Borg, MD  Potassium Chloride ER 20 MEQ TBCR Take 20 mEq by mouth daily.  05/24/19   [provider]  sodium chloride (OCEAN) 0.65 % SOLN nasal spray Place 2 sprays into both nostrils daily as needed for congestion.     [provider]  Sodium Chloride-Sodium Bicarb (NETI POT SINUS [place]) 2300-700 MG KIT Place 1 application into the nose 2 (two) times daily as needed. 04/03/21   Isla Pence, MD  torsemide (DEMADEX) 20 MG tablet Take 20 mg by mouth daily.  01/03/18   [provider]    Allergies    Ace inhibitors, Calcium channel blockers, Diltiazem, Other, Pneumococcal vaccines, Tetanus toxoid, Latex, and Tape  Review of Systems   Review of Systems  All other systems reviewed and are negative.  Physical Exam Updated Vital Signs BP 90/61    Pulse 76    Temp 98.4 F (36.9 C)    Resp 17    SpO2 98%    Physical Exam Vitals and nursing note reviewed.  Constitutional:      General: She is not in acute distress.    Appearance: Normal appearance. She is well-developed.  HENT:     Head: Normocephalic and atraumatic.  Eyes:     Conjunctiva/sclera: Conjunctivae normal.     Pupils: Pupils are equal, round, and reactive to light.  Cardiovascular:     Rate and Rhythm: Normal rate and regular rhythm.     Heart sounds: Normal heart sounds.  Pulmonary:     Effort: Pulmonary effort is normal. No respiratory distress.     Breath sounds: Normal breath sounds.  Abdominal:     General: There is no distension.     Palpations: Abdomen is soft.     Tenderness: There is no abdominal tenderness.     Comments: PD catheter in place.  Abdomen is soft and nontender.  Musculoskeletal:        General: No deformity. Normal range of motion.     Cervical back: Normal range of motion and neck supple.  Skin:    General: Skin is warm and dry.  Neurological:     General: No focal deficit present.     Mental Status: She is alert and oriented to person, place, and time.    ED Results / Procedures / Treatments   Labs (all labs ordered are listed, but only abnormal results are displayed) Labs Reviewed  COMPREHENSIVE METABOLIC PANEL - Abnormal; Notable for the following components:      Result Value   Sodium 133 (*)    Potassium 2.7 (*)    Chloride 95 (*)    CO2 21 (*)    Glucose, Bld 61 (*)    BUN 37 (*)    Creatinine, Ser 14.20 (*)    Albumin 3.2 (*)    AST 47 (*)    Total Bilirubin 1.6 (*)    GFR, Estimated 2 (*)    Anion  gap 17 (*)    All other components within normal limits  CBC WITH DIFFERENTIAL/PLATELET - Abnormal; Notable for the following components:   RBC 3.55 (*)    Hemoglobin 11.3 (*)    HCT 33.7 (*)    All other components within normal limits  LACTIC ACID, PLASMA  LACTIC ACID, PLASMA  URINALYSIS, ROUTINE W REFLEX MICROSCOPIC  CBG MONITORING, ED    EKG None  Radiology DG  Chest 2 View  Result Date: 04/12/2021 CLINICAL DATA:  Hypotension EXAM: CHEST - 2 VIEW COMPARISON:  Previous studies including the examination of 04/03/2021 FINDINGS: Cardiac size is within normal limits. Low position of diaphragms and increase in AP diameter of chest suggests COPD. Surgical staples are seen in the left lung. There are subtle increased interstitial markings in the both upper lung fields with no significant change. There is no new focal pulmonary consolidation. There is blunting of left lateral CP angle. Left hemidiaphragm is elevated. There is no pneumothorax. IMPRESSION: COPD.  There are no new infiltrates or signs of pulmonary edema. Electronically Signed   By: Elmer Picker M.D.   On: 04/12/2021 14:01    Procedures Procedures   Medications Ordered in ED Medications  sodium chloride 0.9 % bolus 500 mL (500 mLs Intravenous New Bag/Given 04/12/21 1938)  potassium chloride 10 mEq in 100 mL IVPB (0 mEq Intravenous Stopped 04/12/21 2040)    ED Course  I have reviewed the triage vital signs and the nursing notes.  Pertinent labs & imaging results that were available during my care of the patient were reviewed by me and considered in my medical decision making (see chart for details).    MDM Rules/Calculators/A&P                           MDM  MSE complete  [name] was evaluated in Emergency Department on 04/12/2021 for the symptoms described in the history of present illness. She was evaluated in the context of the global COVID-19 pandemic, which necessitated consideration that the patient might be at risk for infection with the SARS-CoV-2 virus that causes COVID-19. Institutional protocols and algorithms that pertain to the evaluation of patients at risk for COVID-19 are in a state of rapid change based on information released by regulatory bodies including the CDC and federal and state organizations. These policies and algorithms were followed during the  patient's care in the ED.  Patient presented with complaints of chronic malaise, fatigue, and weakness.  Patient without significant acute symptoms.  Work-up was demonstrative of chronic renal insufficiency and mild hypokalemia.  Patient does feel improved after administration of small amount IV fluids and IV potassium.  Patient is planning on going home and initiating her nightly PD.  She reports that she has already scheduled close follow-up with nephrology later this week.  Patient with complaints of chronic dysphagia.  She is requesting referral for GI evaluation.  Patient offered admission or lengthier observation here in the ED.  She declined same.  She desires discharge home now.  Importance of close follow-up is stressed.  Strict return precautions given and understood.  2200 Addendum.  Patient was being prepared for discharge.  Patient without significant complaint.  Accu-Chek revealed sugar of 29.  Glucose administered.  Final Clinical Impression(s) / ED Diagnoses Final diagnoses:  Weakness  Hypokalemia  Dysphagia, unspecified type    Rx / DC Orders ED Discharge Orders     None  Valarie Merino, MD 04/12/21 [phone]

## 2021-04-13 ENCOUNTER — Encounter: Payer: Self-pay | Admitting: Physician Assistant

## 2021-04-13 LAB — CBG MONITORING, ED: Glucose-Capillary: 33 mg/dL — CL (ref 70–99)

## 2021-04-22 ENCOUNTER — Other Ambulatory Visit: Payer: Self-pay

## 2021-04-22 ENCOUNTER — Emergency Department (HOSPITAL_COMMUNITY): Payer: Medicare HMO

## 2021-04-22 ENCOUNTER — Encounter (HOSPITAL_COMMUNITY): Payer: Self-pay

## 2021-04-22 ENCOUNTER — Inpatient Hospital Stay (HOSPITAL_COMMUNITY)
Admission: EM | Admit: 2021-04-22 | Discharge: 2021-04-29 | DRG: 393 | Disposition: A | Discharge status: Home Health | Payer: Medicare HMO | Attending: Internal Medicine | Admitting: Internal Medicine

## 2021-04-22 DIAGNOSIS — K219 Gastro-esophageal reflux disease without esophagitis: Secondary | ICD-10-CM | POA: Diagnosis present

## 2021-04-22 DIAGNOSIS — K2210 Ulcer of esophagus without bleeding: Secondary | ICD-10-CM | POA: Diagnosis present

## 2021-04-22 DIAGNOSIS — K259 Gastric ulcer, unspecified as acute or chronic, without hemorrhage or perforation: Secondary | ICD-10-CM | POA: Diagnosis present

## 2021-04-22 DIAGNOSIS — W44F3XA Food entering into or through a natural orifice, initial encounter: Secondary | ICD-10-CM

## 2021-04-22 DIAGNOSIS — Z888 Allergy status to other drugs, medicaments and biological substances status: Secondary | ICD-10-CM

## 2021-04-22 DIAGNOSIS — I120 Hypertensive chronic kidney disease with stage 5 chronic kidney disease or end stage renal disease: Secondary | ICD-10-CM | POA: Diagnosis present

## 2021-04-22 DIAGNOSIS — T18128A Food in esophagus causing other injury, initial encounter: Secondary | ICD-10-CM | POA: Diagnosis not present

## 2021-04-22 DIAGNOSIS — Z91048 Other nonmedicinal substance allergy status: Secondary | ICD-10-CM

## 2021-04-22 DIAGNOSIS — Y929 Unspecified place or not applicable: Secondary | ICD-10-CM

## 2021-04-22 DIAGNOSIS — I2721 Secondary pulmonary arterial hypertension: Secondary | ICD-10-CM | POA: Diagnosis present

## 2021-04-22 DIAGNOSIS — E785 Hyperlipidemia, unspecified: Secondary | ICD-10-CM | POA: Diagnosis present

## 2021-04-22 DIAGNOSIS — N186 End stage renal disease: Secondary | ICD-10-CM

## 2021-04-22 DIAGNOSIS — Z7982 Long term (current) use of aspirin: Secondary | ICD-10-CM

## 2021-04-22 DIAGNOSIS — K224 Dyskinesia of esophagus: Secondary | ICD-10-CM | POA: Diagnosis present

## 2021-04-22 DIAGNOSIS — Z8249 Family history of ischemic heart disease and other diseases of the circulatory system: Secondary | ICD-10-CM

## 2021-04-22 DIAGNOSIS — R1310 Dysphagia, unspecified: Secondary | ICD-10-CM

## 2021-04-22 DIAGNOSIS — Z91040 Latex allergy status: Secondary | ICD-10-CM

## 2021-04-22 DIAGNOSIS — Q396 Congenital diverticulum of esophagus: Secondary | ICD-10-CM

## 2021-04-22 DIAGNOSIS — E872 Acidosis, unspecified: Secondary | ICD-10-CM | POA: Diagnosis present

## 2021-04-22 DIAGNOSIS — D631 Anemia in chronic kidney disease: Secondary | ICD-10-CM | POA: Diagnosis present

## 2021-04-22 DIAGNOSIS — I959 Hypotension, unspecified: Secondary | ICD-10-CM | POA: Diagnosis present

## 2021-04-22 DIAGNOSIS — Z681 Body mass index (BMI) 19 or less, adult: Secondary | ICD-10-CM

## 2021-04-22 DIAGNOSIS — E876 Hypokalemia: Secondary | ICD-10-CM | POA: Diagnosis not present

## 2021-04-22 DIAGNOSIS — Z992 Dependence on renal dialysis: Secondary | ICD-10-CM

## 2021-04-22 DIAGNOSIS — E042 Nontoxic multinodular goiter: Secondary | ICD-10-CM | POA: Diagnosis present

## 2021-04-22 DIAGNOSIS — Z79899 Other long term (current) drug therapy: Secondary | ICD-10-CM

## 2021-04-22 DIAGNOSIS — J849 Interstitial pulmonary disease, unspecified: Secondary | ICD-10-CM | POA: Diagnosis present

## 2021-04-22 DIAGNOSIS — R627 Adult failure to thrive: Secondary | ICD-10-CM

## 2021-04-22 DIAGNOSIS — Z87891 Personal history of nicotine dependence: Secondary | ICD-10-CM

## 2021-04-22 DIAGNOSIS — R54 Age-related physical debility: Secondary | ICD-10-CM | POA: Diagnosis present

## 2021-04-22 DIAGNOSIS — R634 Abnormal weight loss: Secondary | ICD-10-CM | POA: Diagnosis present

## 2021-04-22 DIAGNOSIS — Z20822 Contact with and (suspected) exposure to covid-19: Secondary | ICD-10-CM | POA: Diagnosis present

## 2021-04-22 LAB — COMPREHENSIVE METABOLIC PANEL
ALT: 19 U/L (ref 0–44)
AST: 31 U/L (ref 15–41)
Albumin: 2.9 g/dL — ABNORMAL LOW (ref 3.5–5.0)
Alkaline Phosphatase: 66 U/L (ref 38–126)
Anion gap: 15 (ref 5–15)
BUN: 27 mg/dL — ABNORMAL HIGH (ref 8–23)
CO2: 20 mmol/L — ABNORMAL LOW (ref 22–32)
Calcium: 8.1 mg/dL — ABNORMAL LOW (ref 8.9–10.3)
Chloride: 98 mmol/L (ref 98–111)
Creatinine, Ser: 12.39 mg/dL — ABNORMAL HIGH (ref 0.44–1.00)
GFR, Estimated: 3 mL/min — ABNORMAL LOW (ref >60)
Glucose, Bld: 125 mg/dL — ABNORMAL HIGH (ref 70–99)
Potassium: 2.8 mmol/L — ABNORMAL LOW (ref 3.5–5.1)
Sodium: 133 mmol/L — ABNORMAL LOW (ref 135–145)
Total Bilirubin: 0.9 mg/dL (ref 0.3–1.2)
Total Protein: 5.6 g/dL — ABNORMAL LOW (ref 6.5–8.1)

## 2021-04-22 LAB — CBC WITH DIFFERENTIAL/PLATELET
Abs Immature Granulocytes: 0.04 10*3/uL (ref 0.00–0.07)
Basophils Absolute: 0.1 10*3/uL (ref 0.0–0.1)
Basophils Relative: 1 %
Eosinophils Absolute: 0 10*3/uL (ref 0.0–0.5)
Eosinophils Relative: 1 %
HCT: 34.5 % — ABNORMAL LOW (ref 36.0–46.0)
Hemoglobin: 11.2 g/dL — ABNORMAL LOW (ref 12.0–15.0)
Immature Granulocytes: 1 %
Lymphocytes Relative: 11 %
Lymphs Abs: 0.6 10*3/uL — ABNORMAL LOW (ref 0.7–4.0)
MCH: 32.2 pg (ref 26.0–34.0)
MCHC: 32.5 g/dL (ref 30.0–36.0)
MCV: 99.1 fL (ref 80.0–100.0)
Monocytes Absolute: 0.5 10*3/uL (ref 0.1–1.0)
Monocytes Relative: 8 %
Neutro Abs: 4.7 10*3/uL (ref 1.7–7.7)
Neutrophils Relative %: 78 %
Platelets: 254 10*3/uL (ref 150–400)
RBC: 3.48 MIL/uL — ABNORMAL LOW (ref 3.87–5.11)
RDW: 15.3 % (ref 11.5–15.5)
WBC: 5.9 10*3/uL (ref 4.0–10.5)
nRBC: 0 % (ref 0.0–0.2)

## 2021-04-22 LAB — LACTIC ACID, PLASMA: Lactic Acid, Venous: 2.5 mmol/L (ref 0.5–1.9)

## 2021-04-22 MED ORDER — POTASSIUM CHLORIDE 10 MEQ/100ML IV SOLN
10.0000 meq | Freq: Once | INTRAVENOUS | Status: AC
  Administered 2021-04-22: 10 meq via INTRAVENOUS
  Filled 2021-04-22: qty 100

## 2021-04-22 MED ORDER — SODIUM CHLORIDE 0.9 % IV BOLUS
500.0000 mL | Freq: Once | INTRAVENOUS | Status: AC
  Administered 2021-04-22: 500 mL via INTRAVENOUS

## 2021-04-22 NOTE — ED Triage Notes (Signed)
Pt states she has been having dizzy spells for about a month, states she has progressively gotten weaker over the last month.  Today states she just feels too weak to take care of herself, no n/v today.

## 2021-04-22 NOTE — ED Provider Notes (Signed)
[facility] EMERGENCY DEPARTMENT Provider Note   CSN: [id number] Arrival date & time: 04/22/21  2017     History Chief Complaint  Patient presents with   Dizziness    [name] is a 75 y.o. female.   Dizziness Associated symptoms: no chest pain and no shortness of breath   Patient presents with dizziness and weakness.  Has had for months now.  Has been seen for the same.  Reportedly worse over the last month.  States she is lost around 20 pounds.  Decreased oral intake.  States she will vomit.  States when she eats she feels the food gets caught in her mid chest.  She is a peritoneal dialysis patient.  States she does not really having abdominal pain.  States she cannot get up and walk around anymore because she gets to lightheadedness.  No fevers or chills.  Reviewing records it appears as if she does have a history of some low blood pressure.  She does peritoneal dialysis every night.    Past Medical History:  Diagnosis Date   Acute kidney injury ([place]) 07/20/2016   Allergic rhinitis    Anxiety    CAP (community acquired pneumonia) 06/2016   [name] 06/19/2016   Cervical disc disease    /notes 07/20/2016   Chest pain at rest 06/18/2012   COPD (chronic obstructive pulmonary disease) ([place]) 05/23/2012   Diverticulosis    DJD (degenerative joint disease)    Esophageal diverticulum    Esophageal stricture    ESRD (end stage renal disease) (HCC)    GERD (gastroesophageal reflux disease)    Hiatal hernia    History of blood transfusion 06/2016   "when I was hospitalized w/sepsis"   Hyperlipidemia 05/23/2012   T. Chol 234, LDL 130    Hypertension    IBS (irritable bowel syndrome)    Lung nodules    hx  with extensive workup including lung biopsy ruling out Sjogren's disease/notes 07/20/2016   Osteopenia    Recurrent sinusitis    /noters 07/20/2016   Renal insufficiency    Sinus headache    "daily lately" (07/20/2016)   Traumatic arthritis      Patient Active Problem List   Diagnosis Date Noted   Anaphylactic shock, unspecified, initial encounter 01/14/2021   Postural dizziness with presyncope 08/20/2019   Orthostasis 08/18/2019   Acidosis 01/20/2019   Other dietary vitamin B12 deficiency anemia 01/20/2019   Other disorders of electrolyte and fluid balance, not elsewhere classified 01/20/2019   Stage 5 chronic kidney disease on chronic dialysis ([place]) 12/14/2018   Hypertensive chronic kidney disease with stage 1 through stage 4 chronic kidney disease, or unspecified chronic kidney disease 10/04/2018   Anemia in chronic kidney disease 10/04/2018   Coagulation defect, unspecified ([place]) 10/04/2018   Iron deficiency anemia, unspecified 10/04/2018   Other disorders of bilirubin metabolism 10/04/2018   GERD (gastroesophageal reflux disease) 06/11/2018   Muscle weakness of lower extremity 06/11/2018   Diaphragmatic hernia without obstruction or gangrene 05/15/2018   Pre-transplant evaluation for kidney transplant 05/15/2018   Glomerulonephritis 05/15/2018   Hypercholesterolemia 05/15/2018   Protein-calorie malnutrition, severe 02/06/2018   Peritonitis ([place]) 02/05/2018   Hypokalemia 02/05/2018   Pulmonary nodules    ESRD (end stage renal disease) on dialysis ([place])    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   Hyperphosphatemia 07/25/2016   COPD (chronic obstructive pulmonary disease) ([place]) 05/23/2012   Acute respiratory failure with hypoxia ([place]) 05/23/2012   Essential  hypertension 12/25/2007    Past Surgical History:  Procedure Laterality Date   ANTERIOR CERVICAL DECOMP/DISCECTOMY FUSION  2005   BACK SURGERY     COLONOSCOPY  2001   /notes 09/21/2010   CYSTECTOMY Bilateral    hx/notes 09/21/2010   IR GENERIC HISTORICAL  07/20/2016   IR FLUORO GUIDE CV LINE RIGHT 07/20/2016 [name], MD [facility]   IR GENERIC HISTORICAL  07/20/2016   IR US GUIDE VASC ACCESS RIGHT  07/20/2016 [name], MD [facility]   LUNG BIOPSY Left    left side, not cancerous, thoracotomy   TONSILLECTOMY AND ADENOIDECTOMY     hx/notes 09/21/2010   VAGINAL HYSTERECTOMY     hx w/oophorectomy/notes 09/21/2010     OB History   No obstetric history on file.     Family History  Problem Relation Age of Onset   Throat cancer Father    Hypertension Sister    Thyroid disease Daughter    Hypertension Sister        x 2   Breast cancer Maternal Aunt    Blindness Sister        legally blind   Colon cancer Neg Hx    Stomach cancer Neg Hx     Social History   Tobacco Use   Smoking status: Former    Packs/day: 0.12    Years: 8.00    Pack years: 0.96    Types: Cigarettes    Quit date: 05/04/1985    Years since quitting: 35.9   Smokeless tobacco: Never  Vaping Use   Vaping Use: Never used  Substance Use Topics   Alcohol use: Yes    Alcohol/week: 1.0 standard drink    Types: 1 Cans of beer per week   Drug use: No    Home Medications Prior to Admission medications   Medication Sig Start Date End Date Taking? Authorizing Provider  acetaminophen (TYLENOL) 500 MG tablet Take 1,000 mg by mouth every 6 (six) hours as needed (for headaches or pain).     [provider]  albuterol (VENTOLIN HFA) 108 (90 Base) MCG/ACT inhaler Inhale 1-2 puffs into the lungs every 6 (six) hours as needed for wheezing or shortness of breath. 12/16/19   Parrett, Fonnie Mu, NP  amoxicillin-clavulanate (AUGMENTIN) 875-125 MG tablet Take 1 tablet by mouth every 12 (twelve) hours. Patient not taking: Reported on 03/19/2021 02/17/21   Sherrell Puller, PA-C  aspirin EC 81 MG tablet Take 81 mg by mouth daily.    [provider]  atorvastatin (LIPITOR) 10 MG tablet Take 1 tablet (10 mg total) by mouth daily. Patient not taking: Reported on 03/19/2021 10/20/15   Biagio Borg, MD  atorvastatin (LIPITOR) 20 MG tablet Take 20 mg by mouth daily. 02/24/21   [provider]  benzonatate  (TESSALON) 100 MG capsule Take 1 capsule (100 mg total) by mouth every 8 (eight) hours. 04/03/21   Isla Pence, MD  BREO ELLIPTA 100-25 MCG/INH AEPB Inhale 1 puff into the lungs daily.  06/30/19   [provider]  calcitRIOL (ROCALTROL) 0.25 MCG capsule Take 0.25 mcg by mouth daily.  03/14/19   [provider]  COLACE 100 MG capsule Take 100 mg by mouth daily as needed for mild constipation.  03/14/19   [provider]  cyclobenzaprine (FLEXERIL) 5 MG tablet Take 5 mg by mouth 3 (three) times daily as needed for muscle spasms. 01/15/21   [provider]  ergocalciferol (VITAMIN D2) 1.25 MG (50000 UT) capsule Take  50,000 Units by mouth every Sunday.    [provider]  ferric citrate (AURYXIA) 1 GM 210 MG(Fe) tablet Take 210 mg by mouth 3 (three) times daily with meals.    [provider]  fluticasone (FLONASE) 50 MCG/ACT nasal spray Place 1 spray into both nostrils daily as needed for allergies or rhinitis. 01/15/21   [provider]  lactulose (CHRONULAC) 10 GM/15ML solution Take 10 g by mouth daily as needed for mild constipation.    [provider]  loratadine-pseudoephedrine (CLARITIN-D 12 HOUR) 5-120 MG tablet Take 1 tablet by mouth 2 (two) times daily as needed for allergies.    [provider]  Melatonin 5 MG TABS Take 5 mg by mouth at bedtime as needed (for sleep).     [provider]  midodrine (PROAMATINE) 5 MG tablet Take 1 tablet (5 mg total) by mouth 3 (three) times daily with meals. Patient not taking: No sig reported 08/21/19   Damita Lack, MD  multivitamin (RENA-VIT) TABS tablet Take 1 tablet by mouth daily.    [provider]  pantoprazole (PROTONIX) 40 MG tablet Take 1 tablet (40 mg total) by mouth daily. 10/20/15   Biagio Borg, MD  Potassium Chloride ER 20 MEQ TBCR Take 20 mEq by mouth daily.  05/24/19   [provider]  sodium chloride (OCEAN) 0.65 % SOLN nasal spray  Place 2 sprays into both nostrils daily as needed for congestion.     [provider]  Sodium Chloride-Sodium Bicarb (NETI POT SINUS [place]) 2300-700 MG KIT Place 1 application into the nose 2 (two) times daily as needed. 04/03/21   Isla Pence, MD  torsemide (DEMADEX) 20 MG tablet Take 20 mg by mouth daily.  01/03/18   [provider]    Allergies    Ace inhibitors, Calcium channel blockers, Diltiazem, Other, Pneumococcal vaccines, Tetanus toxoid, Latex, and Tape  Review of Systems   Review of Systems  Constitutional:  Positive for appetite change.  HENT:  Negative for congestion.   Respiratory:  Negative for shortness of breath.   Cardiovascular:  Negative for chest pain.  Gastrointestinal:  Negative for abdominal pain.  Genitourinary:  Negative for flank pain.  Skin:  Negative for wound.  Neurological:  Positive for dizziness and light-headedness.  Psychiatric/Behavioral:  Negative for confusion.    Physical Exam Updated Vital Signs BP 95/63    Pulse 86    Temp 97.7 F (36.5 C)    Resp 16    Ht 5' (1.524 m)    Wt 43 kg    SpO2 100%    BMI 18.51 kg/m   Physical Exam Vitals and nursing note reviewed.  HENT:     Head: Atraumatic.  Eyes:     Pupils: Pupils are equal, round, and reactive to light.  Cardiovascular:     Rate and Rhythm: Regular rhythm.  Pulmonary:     Breath sounds: No wheezing or rhonchi.  Abdominal:     Tenderness: There is no abdominal tenderness.     Comments: Peritoneal dialysis catheter left lower abdomen.  No drainage.  No erythema.  Musculoskeletal:        General: No tenderness.     Cervical back: Neck supple.  Skin:    General: Skin is warm.     Capillary Refill: Capillary refill takes less than 2 seconds.  Neurological:     Mental Status: She is alert and oriented to person, place, and time.    ED Results /  Procedures / Treatments   Labs (all labs ordered are listed, but only abnormal results are displayed) Labs Reviewed   LACTIC ACID, PLASMA - Abnormal; Notable for the following components:      Result Value   Lactic Acid, Venous 2.5 (*)    All other components within normal limits  COMPREHENSIVE METABOLIC PANEL - Abnormal; Notable for the following components:   Sodium 133 (*)    Potassium 2.8 (*)    CO2 20 (*)    Glucose, Bld 125 (*)    BUN 27 (*)    Creatinine, Ser 12.39 (*)    Calcium 8.1 (*)    Total Protein 5.6 (*)    Albumin 2.9 (*)    GFR, Estimated 3 (*)    All other components within normal limits  CBC WITH DIFFERENTIAL/PLATELET - Abnormal; Notable for the following components:   RBC 3.48 (*)    Hemoglobin 11.2 (*)    HCT 34.5 (*)    Lymphs Abs 0.6 (*)    All other components within normal limits  CULTURE, BLOOD (ROUTINE X 2)  CULTURE, BLOOD (ROUTINE X 2)  LACTIC ACID, PLASMA    EKG None  Radiology DG Chest Portable 1 View  Result Date: 04/22/2021 CLINICAL DATA:  Weakness EXAM: PORTABLE CHEST 1 VIEW COMPARISON:  04/12/2021 FINDINGS: There is hyperinflation of the lungs compatible with COPD. Mild elevation of the left hemidiaphragm. Postoperative changes in the left lung. No confluent airspace opacities or effusions. Heart is normal size. No acute bony abnormality. IMPRESSION: COPD/chronic changes.  No active disease. Electronically Signed   By: Rolm Baptise M.D.   On: 04/22/2021 21:12    Procedures Procedures   Medications Ordered in ED Medications  sodium chloride 0.9 % bolus 500 mL (500 mLs Intravenous New Bag/Given 04/22/21 2108)    ED Course  I have reviewed the triage vital signs and the nursing notes.  Pertinent labs & imaging results that were available during my care of the patient were reviewed by me and considered in my medical decision making (see chart for details).    MDM Rules/Calculators/A&P                         Patient presents generalized weakness.  Dizziness.  Has had over at least the last month but it seems as if it may actually be longer than  that.  Recurrent hypokalemia.  Is hyperkalemic again today.  She is on peritoneal dialysis.  No signs of infection.  Appears as if she has previously been on midodrine so likely has the recurrent hypotension.  States she is lost about 20 pounds because she cannot eat because it feels of a gets caught in her chest.  Not able ambulate at this point.  To generally weak.  Will require admission to the hospital.  Do not think this is a sepsis.  Will admit to medicine for further work-up and treatment.  Fluid bolus have been given.  We will also supplement some potassium    Final Clinical Impression(s) / ED Diagnoses Final diagnoses:  Hypokalemia  Hypotension, unspecified hypotension type  End stage renal disease on dialysis [facility])    Rx / [place] Orders ED Discharge Orders     None        Davonna Belling, MD 04/22/21 2305

## 2021-04-23 DIAGNOSIS — R1310 Dysphagia, unspecified: Secondary | ICD-10-CM | POA: Diagnosis not present

## 2021-04-23 DIAGNOSIS — R627 Adult failure to thrive: Secondary | ICD-10-CM | POA: Diagnosis not present

## 2021-04-23 DIAGNOSIS — Z992 Dependence on renal dialysis: Secondary | ICD-10-CM

## 2021-04-23 DIAGNOSIS — N186 End stage renal disease: Secondary | ICD-10-CM

## 2021-04-23 DIAGNOSIS — I9589 Other hypotension: Secondary | ICD-10-CM | POA: Diagnosis not present

## 2021-04-23 DIAGNOSIS — I959 Hypotension, unspecified: Secondary | ICD-10-CM | POA: Diagnosis present

## 2021-04-23 DIAGNOSIS — E861 Hypovolemia: Secondary | ICD-10-CM

## 2021-04-23 LAB — BASIC METABOLIC PANEL
Anion gap: 15 (ref 5–15)
Anion gap: 10 (ref 5–15)
BUN: 27 mg/dL — ABNORMAL HIGH (ref 8–23)
BUN: 28 mg/dL — ABNORMAL HIGH (ref 8–23)
CO2: 21 mmol/L — ABNORMAL LOW (ref 22–32)
CO2: 19 mmol/L — ABNORMAL LOW (ref 22–32)
Calcium: 8.3 mg/dL — ABNORMAL LOW (ref 8.9–10.3)
Calcium: 7.6 mg/dL — ABNORMAL LOW (ref 8.9–10.3)
Chloride: 99 mmol/L (ref 98–111)
Chloride: 99 mmol/L (ref 98–111)
Creatinine, Ser: 12.42 mg/dL — ABNORMAL HIGH (ref 0.44–1.00)
Creatinine, Ser: 11.71 mg/dL — ABNORMAL HIGH (ref 0.44–1.00)
GFR, Estimated: 3 mL/min — ABNORMAL LOW (ref >60)
GFR, Estimated: 3 mL/min — ABNORMAL LOW (ref >60)
Glucose, Bld: 94 mg/dL (ref 70–99)
Glucose, Bld: 131 mg/dL — ABNORMAL HIGH (ref 70–99)
Potassium: 2.9 mmol/L — ABNORMAL LOW (ref 3.5–5.1)
Potassium: 3.9 mmol/L (ref 3.5–5.1)
Sodium: 135 mmol/L (ref 135–145)
Sodium: 128 mmol/L — ABNORMAL LOW (ref 135–145)

## 2021-04-23 LAB — RESP PANEL BY RT-PCR (FLU A&B, COVID) ARPGX2
Influenza A by PCR: NEGATIVE
Influenza B by PCR: NEGATIVE
SARS Coronavirus 2 by RT PCR: NEGATIVE

## 2021-04-23 LAB — LACTIC ACID, PLASMA: Lactic Acid, Venous: 1.3 mmol/L (ref 0.5–1.9)

## 2021-04-23 LAB — PHOSPHORUS: Phosphorus: 4.3 mg/dL (ref 2.5–4.6)

## 2021-04-23 MED ORDER — ENSURE ENLIVE PO LIQD
237.0000 mL | Freq: Two times a day (BID) | ORAL | Status: DC
  Administered 2021-04-25 – 2021-04-27 (×4): 237 mL via ORAL

## 2021-04-23 MED ORDER — ONDANSETRON HCL 4 MG/2ML IJ SOLN
4.0000 mg | Freq: Four times a day (QID) | INTRAMUSCULAR | Status: DC | PRN
  Administered 2021-04-23 – 2021-04-28 (×5): 4 mg via INTRAVENOUS
  Filled 2021-04-23 (×5): qty 2

## 2021-04-23 MED ORDER — HEPARIN SODIUM (PORCINE) 5000 UNIT/ML IJ SOLN
5000.0000 [IU] | Freq: Three times a day (TID) | INTRAMUSCULAR | Status: DC
  Administered 2021-04-23 – 2021-04-29 (×17): 5000 [IU] via SUBCUTANEOUS
  Filled 2021-04-23 (×17): qty 1

## 2021-04-23 MED ORDER — GENTAMICIN SULFATE 0.1 % EX CREA
1.0000 "application " | TOPICAL_CREAM | Freq: Every day | CUTANEOUS | Status: DC
  Administered 2021-04-23 – 2021-04-28 (×6): 1 via TOPICAL
  Filled 2021-04-23 (×2): qty 15

## 2021-04-23 MED ORDER — SODIUM CHLORIDE 0.9 % IV BOLUS
1000.0000 mL | Freq: Once | INTRAVENOUS | Status: AC
  Administered 2021-04-23: 1000 mL via INTRAVENOUS

## 2021-04-23 MED ORDER — COSYNTROPIN 0.25 MG IJ SOLR
0.2500 mg | Freq: Once | INTRAMUSCULAR | Status: AC
  Administered 2021-04-24: 0.25 mg via INTRAVENOUS
  Filled 2021-04-23 (×2): qty 0.25

## 2021-04-23 MED ORDER — ATORVASTATIN CALCIUM 10 MG PO TABS
20.0000 mg | ORAL_TABLET | Freq: Every day | ORAL | Status: DC
  Administered 2021-04-23 – 2021-04-29 (×7): 20 mg via ORAL
  Filled 2021-04-23 (×7): qty 2

## 2021-04-23 MED ORDER — SODIUM CHLORIDE 0.9 % IV SOLN: INTRAVENOUS | Status: DC

## 2021-04-23 MED ORDER — ASPIRIN EC 81 MG PO TBEC
81.0000 mg | DELAYED_RELEASE_TABLET | Freq: Every day | ORAL | Status: DC
  Administered 2021-04-23 – 2021-04-29 (×7): 81 mg via ORAL
  Filled 2021-04-23 (×7): qty 1

## 2021-04-23 MED ORDER — POTASSIUM CHLORIDE 10 MEQ/100ML IV SOLN
10.0000 meq | INTRAVENOUS | Status: AC
  Administered 2021-04-23 (×4): 10 meq via INTRAVENOUS
  Filled 2021-04-23 (×3): qty 100

## 2021-04-23 MED ORDER — POTASSIUM CHLORIDE 20 MEQ PO PACK
20.0000 meq | PACK | Freq: Two times a day (BID) | ORAL | Status: DC
  Administered 2021-04-23 – 2021-04-24 (×3): 20 meq via ORAL
  Filled 2021-04-23 (×4): qty 1

## 2021-04-23 MED ORDER — CALCITRIOL 0.25 MCG PO CAPS
0.2500 ug | ORAL_CAPSULE | Freq: Every day | ORAL | Status: DC
  Administered 2021-04-24 – 2021-04-29 (×5): 0.25 ug via ORAL
  Filled 2021-04-23 (×7): qty 1

## 2021-04-23 MED ORDER — PROCHLORPERAZINE EDISYLATE 10 MG/2ML IJ SOLN
10.0000 mg | INTRAMUSCULAR | Status: DC | PRN
  Administered 2021-04-23: 10 mg via INTRAVENOUS
  Filled 2021-04-23: qty 2

## 2021-04-23 MED ORDER — DELFLEX-LC/1.5% DEXTROSE 344 MOSM/L IP SOLN
INTRAPERITONEAL | Status: DC
  Administered 2021-04-23 – 2021-04-24 (×2): 5000 mL via INTRAPERITONEAL

## 2021-04-23 MED ORDER — MIDODRINE HCL 5 MG PO TABS
5.0000 mg | ORAL_TABLET | Freq: Three times a day (TID) | ORAL | Status: DC
  Administered 2021-04-23 – 2021-04-25 (×8): 5 mg via ORAL
  Filled 2021-04-23 (×9): qty 1

## 2021-04-23 MED ORDER — POTASSIUM CHLORIDE 10 MEQ/100ML IV SOLN
INTRAVENOUS | Status: AC
  Filled 2021-04-23: qty 100

## 2021-04-23 MED ORDER — PANTOPRAZOLE SODIUM 40 MG PO TBEC
40.0000 mg | DELAYED_RELEASE_TABLET | Freq: Every day | ORAL | Status: DC
  Administered 2021-04-23 – 2021-04-26 (×4): 40 mg via ORAL
  Filled 2021-04-23 (×5): qty 1

## 2021-04-23 NOTE — Consult Note (Signed)
Renal Service Consult Note [facility]  [name] 04/23/2021 [name], MD Requesting Physician: Dr. Benny Lennert  Reason for Consult: ESRD pt on PD w/ wt loss , FTT HPI: The patient is a 75 y.o. year-old w/ hx of COPD, DJD, ESRD on PD, HL , HTN who presented to ED w/  multiple c/o's. Since September has been dealing w/ recurrent sinus issues/ infections, also having trouble eating, some solids feel stuck when trying to swallow. Drinking liquids causes N/V. Also persistent diarrhea. No abd pain, no troubles w/ her PD. No fever. States 25 lb wt loss over past several mos. So weak lately that she is finding it hard to walk. Asked to see for ESRD.   Pt states that "clearances" were not good and she was ^'d for 4 to 5 exchanges per night not too long ago.  BP's low and midodrine was prescribed. No abd pain or fevers, no cloudy fluid , no pus at the exit site. No cough, SOB, orthopnea or leg swelling. Does not make much urine now.   ROS - denies CP, no joint pain, no HA, no blurry vision, no rash   Past Medical History  Past Medical History:  Diagnosis Date   Acute kidney injury ([place]) 07/20/2016   Allergic rhinitis    Anxiety    CAP (community acquired pneumonia) 06/2016   [name] 06/19/2016   Cervical disc disease    /notes 07/20/2016   Chest pain at rest 06/18/2012   COPD (chronic obstructive pulmonary disease) ([place]) 05/23/2012   Diverticulosis    DJD (degenerative joint disease)    Esophageal diverticulum    Esophageal stricture    ESRD (end stage renal disease) (HCC)    GERD (gastroesophageal reflux disease)    Hiatal hernia    History of blood transfusion 06/2016   "when I was hospitalized w/sepsis"   Hyperlipidemia 05/23/2012   T. Chol 234, LDL 130    Hypertension    IBS (irritable bowel syndrome)    Lung nodules    hx  with extensive workup including lung biopsy ruling out Sjogren's disease/notes 07/20/2016   Osteopenia    Recurrent sinusitis     /noters 07/20/2016   Renal insufficiency    Sinus headache    "daily lately" (07/20/2016)   Traumatic arthritis    Past Surgical History  Past Surgical History:  Procedure Laterality Date   ANTERIOR CERVICAL DECOMP/DISCECTOMY FUSION  2005   BACK SURGERY     COLONOSCOPY  2001   /notes 09/21/2010   CYSTECTOMY Bilateral    hx/notes 09/21/2010   IR GENERIC HISTORICAL  07/20/2016   IR FLUORO GUIDE CV LINE RIGHT 07/20/2016 [name], MD [facility]   IR GENERIC HISTORICAL  07/20/2016   IR US GUIDE VASC ACCESS RIGHT 07/20/2016 [name], MD [facility]   LUNG BIOPSY Left    left side, not cancerous, thoracotomy   TONSILLECTOMY AND ADENOIDECTOMY     hx/notes 09/21/2010   VAGINAL HYSTERECTOMY     hx w/oophorectomy/notes 09/21/2010   Family History  Family History  Problem Relation Age of Onset   Throat cancer Father    Hypertension Sister    Thyroid disease Daughter    Hypertension Sister        x 2   Breast cancer Maternal Aunt    Blindness Sister        legally blind   Colon cancer Neg Hx    Stomach cancer Neg Hx    Social History  reports that she quit smoking about 35 years ago. Her smoking use included cigarettes. She has a 0.96 pack-year smoking history. She has never used smokeless tobacco. She reports current alcohol use of about 1.0 standard drink per week. She reports that she does not use drugs. Allergies  Allergies  Allergen Reactions   Ace Inhibitors Anaphylaxis, Swelling and Other (See Comments)    Angioedema   Calcium Channel Blockers Anaphylaxis, Itching, Rash and Other (See Comments)    Red rash, Knot (sore and itches), and a fever for a couple days (per pt report)   Pneumococcal Vaccines Other (See Comments)    Red rash, Knot (sore and itches), A fever for a couple days (per pt report)   Tetanus Toxoid Swelling and Other (See Comments)    FEVER and cellulitis in an arm     Latex Rash    Reaction to gloves   Tape Rash and Other (See Comments)    PLEASE USE  PAPER TAPE!!   Home medications Prior to Admission medications   Medication Sig Start Date End Date Taking? Authorizing Provider  acetaminophen (TYLENOL) 500 MG tablet Take 1,000 mg by mouth every 6 (six) hours as needed (for headaches or pain).    Yes [provider]  albuterol (VENTOLIN HFA) 108 (90 Base) MCG/ACT inhaler Inhale 1-2 puffs into the lungs every 6 (six) hours as needed for wheezing or shortness of breath. 12/16/19  Yes Parrett, Tammy S, NP  aspirin EC 81 MG tablet Take 81 mg by mouth daily.   Yes [provider]  atorvastatin (LIPITOR) 20 MG tablet Take 20 mg by mouth daily. 02/24/21  Yes [provider]  BREO ELLIPTA 100-25 MCG/INH AEPB Inhale 1 puff into the lungs daily as needed (shortness of breath). 06/30/19  Yes [provider]  calcitRIOL (ROCALTROL) 0.25 MCG capsule Take 0.25 mcg by mouth daily.  03/14/19  Yes [provider]  COLACE 100 MG capsule Take 100 mg by mouth daily as needed for mild constipation.  03/14/19  Yes [provider]  cyclobenzaprine (FLEXERIL) 5 MG tablet Take 5 mg by mouth 3 (three) times daily as needed for muscle spasms. 01/15/21  Yes [provider]  ergocalciferol (VITAMIN D2) 1.25 MG (50000 UT) capsule Take 50,000 Units by mouth every Sunday.   Yes [provider]  ferric citrate (AURYXIA) 1 GM 210 MG(Fe) tablet Take 210 mg by mouth 3 (three) times daily with meals.   Yes [provider]  fluticasone (FLONASE) 50 MCG/ACT nasal spray Place 1 spray into both nostrils daily as needed for allergies or rhinitis. 01/15/21  Yes [provider]  loratadine-pseudoephedrine (CLARITIN-D 12 HOUR) 5-120 MG tablet Take 1 tablet by mouth 2 (two) times daily as needed for allergies.   Yes [provider]  Melatonin 5 MG TABS Take 5 mg by mouth at bedtime as needed (for sleep).    Yes [provider]  midodrine (PROAMATINE) 5 MG tablet Take 1 tablet (5 mg total) by  mouth 3 (three) times daily with meals. 08/21/19  Yes Amin, Jeanella Flattery, MD  multivitamin (RENA-VIT) TABS tablet Take 1 tablet by mouth daily.   Yes [provider]  ondansetron (ZOFRAN-ODT) 4 MG disintegrating tablet Take 4 mg by mouth every 6 (six) hours as needed for nausea. 04/20/21  Yes [provider]  pantoprazole (PROTONIX) 40 MG tablet Take 1 tablet (40 mg total) by mouth daily. 10/20/15  Yes Biagio Borg, MD  Potassium Chloride ER 20  MEQ TBCR Take 20 mEq by mouth daily.  05/24/19  Yes [provider]  sodium chloride (OCEAN) 0.65 % SOLN nasal spray Place 2 sprays into both nostrils daily as needed for congestion.    Yes [provider]  Sodium Chloride-Sodium Bicarb (NETI POT SINUS [place]) 2300-700 MG KIT Place 1 application into the nose 2 (two) times daily as needed. Patient taking differently: Place 1 application into the nose 2 (two) times daily as needed (congestion). 04/03/21  Yes Isla Pence, MD  torsemide (DEMADEX) 20 MG tablet Take 20 mg by mouth daily.  01/03/18  Yes [provider]  amoxicillin-clavulanate (AUGMENTIN) 875-125 MG tablet Take 1 tablet by mouth every 12 (twelve) hours. Patient not taking: Reported on 03/19/2021 02/17/21   Sherrell Puller, PA-C  atorvastatin (LIPITOR) 10 MG tablet Take 1 tablet (10 mg total) by mouth daily. Patient not taking: Reported on 03/19/2021 10/20/15   Biagio Borg, MD  benzonatate (TESSALON) 100 MG capsule Take 1 capsule (100 mg total) by mouth every 8 (eight) hours. Patient not taking: Reported on 04/23/2021 04/03/21   Isla Pence, MD     Vitals:   04/23/21 1315 04/23/21 1401 04/23/21 1403 04/23/21 1500  BP:   (!) 103/56 (!) 86/51  Pulse: 86 71  70  Resp: '13 14 14 12  ' Temp:      TempSrc:      SpO2: 97% 97%  98%  Weight:      Height:       Exam Gen alert, no distress,  No rash, cyanosis or gangrene Sclera anicteric, throat clear  No jvd or bruits Chest clear bilat to bases, no  rales/ wheezing RRR no MRG Abd soft ntnd no mass or ascites +bs   L mid abd PD cath intact GU defer MS no joint effusions or deformity Ext no LE or UE edema, no wounds or ulcers Neuro is alert, Ox 3 , nf     Home meds include - asa, lipitor, midodrine 5 tid, protonix     OP PD: CCPD 7x per week, 43kg , 6 exchanges  , fill 2500cc , last fill 2000, no daytime exchange, dwell time 1.5h     Assessment/ Plan: Dysphagia/ N+V/ Diarrhea/ wt loss - FTT, unclear GI issue vs other.  These issues could be due to PD failure, but that is a diagnosis of exclusion.  See what inpatient w/u shows first. Will get clearances.  ESRD - on PD for about 5 years. Cont regimen here.  BP/volume - BP's soft / low, cont midodrine, looks dry.  Will bolus 1 L NS overnight. Use lowest UF w/ all 1.5% fluids.  COPD Anemia ckd - Hb 11, no esa needed MBD ckd - Ca in range, add on phos      Rob Nathasha Fiorillo  MD 04/23/2021, 3:39 PM  Recent Labs  Lab 04/22/21 2047  WBC 5.9  HGB 11.2*   Recent Labs  Lab 04/22/21 2047 04/23/21 0441  K 2.8* 2.9*  BUN 27* 27*  CREATININE 12.39* 12.42*  CALCIUM 8.1* 8.3*

## 2021-04-23 NOTE — Consult Note (Signed)
[facility] Consult: 4:12 PM 04/23/2021  LOS: 0 days    Referring Provider: Dr Benny Lennert  Primary Care Physician:  [facility] Primary Gastroenterologist:  Dr. Scarlette Shorts, MD    Reason for Consultation: Dysphagia.  Failure to thrive, weight loss.   HPI: [name] is a 75 y.o. female.  PMH ESRD on peritoneal dialysis for about a year.  Interstitial lung disease.  Pulmonary artery hypertension.  02/2000 colonoscopy for evaluation of constipation and neoplasia screening.  Nonbleeding internal hemorrhoids.  Sigmoid diverticulosis.  Diagnosed as functional constipation. 07/2012 average risk screening colonoscopy.  Mild sigmoid diverticulosis otherwise normal study to cecum.  04/2015 EGD.  For evaluation of dysphagia at level of right pharynx.  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 ED encounters beginning in mid October for sinusitis, orthostasis, weakness. 05/2015 esophagram: Epiphrenic diverticulum noted, no Zenker's diverticulum.  GERD.  C3/4 degenerative disc disease  Scented to ED today evening for evaluation of weakness, dizzy spells for about a month.  Treated with antihistamines to no avail, also antibiotics.  She describes solid food dysphagia at the level of the upper to mid esophagus.  When she eats it feels like it gets stuck, sensation of something being there is persistent.  Reports nausea, no emesis, no regurgitation.  Weight has gone from 125 down to 90 something.  No abdominal pain.  No funky discharge from her PD catheter.  Mouth is dry.  Complains of upper back and shoulder pain as well as headaches.  She made an appointment to be seen at the GI office by PA lemmon for 12/20 Pt compliant w Pantroprazole 40 mg daily.    Hb 11.2,  MCV 99.  Potassium 2.9.  Lactic acid 2.5. CXR with COPD, chronic changes, no active lung or cardiac disease.  Family history negative for colorectal cancer, gastric cancer, peptic ulcer disease, GI bleeds, anemia. Patient normally lives on her own though recently her daughter has been staying with her.    Past Medical History:  Diagnosis Date   Acute kidney injury ([place]) 07/20/2016   Allergic rhinitis    Anxiety    CAP (community acquired pneumonia) 06/2016   [name] 06/19/2016   Cervical disc disease    /notes 07/20/2016   Chest pain at rest 06/18/2012   COPD (chronic obstructive pulmonary disease) ([place]) 05/23/2012   Diverticulosis    DJD (degenerative joint disease)    Esophageal diverticulum    Esophageal stricture    ESRD (end stage renal disease) (HCC)    GERD (gastroesophageal reflux disease)    Hiatal hernia    History of blood transfusion 06/2016   "when I was hospitalized w/sepsis"   Hyperlipidemia 05/23/2012   T. Chol 234, LDL 130    Hypertension    IBS (irritable bowel syndrome)    Lung nodules    hx  with extensive workup including lung biopsy ruling out Sjogren's disease/notes 07/20/2016   Osteopenia    Recurrent sinusitis    /noters 07/20/2016   Renal insufficiency    Sinus headache    "  daily lately" (07/20/2016)   Traumatic arthritis     Past Surgical History:  Procedure Laterality Date   ANTERIOR CERVICAL DECOMP/DISCECTOMY FUSION  2005   BACK SURGERY     COLONOSCOPY  2001   /notes 09/21/2010   CYSTECTOMY Bilateral    hx/notes 09/21/2010   IR GENERIC HISTORICAL  07/20/2016   IR FLUORO GUIDE CV LINE RIGHT 07/20/2016 [name], MD [facility]   IR GENERIC HISTORICAL  07/20/2016   IR US GUIDE VASC ACCESS RIGHT 07/20/2016 [name], MD [facility]   LUNG BIOPSY Left    left side, not cancerous, thoracotomy   TONSILLECTOMY AND ADENOIDECTOMY     hx/notes 09/21/2010   VAGINAL HYSTERECTOMY     hx w/oophorectomy/notes 09/21/2010    Prior to Admission  medications   Medication Sig Start Date End Date Taking? Authorizing Provider  acetaminophen (TYLENOL) 500 MG tablet Take 1,000 mg by mouth every 6 (six) hours as needed (for headaches or pain).    Yes [provider]  albuterol (VENTOLIN HFA) 108 (90 Base) MCG/ACT inhaler Inhale 1-2 puffs into the lungs every 6 (six) hours as needed for wheezing or shortness of breath. 12/16/19  Yes Parrett, Tammy S, NP  aspirin EC 81 MG tablet Take 81 mg by mouth daily.   Yes [provider]  atorvastatin (LIPITOR) 20 MG tablet Take 20 mg by mouth daily. 02/24/21  Yes [provider]  BREO ELLIPTA 100-25 MCG/INH AEPB Inhale 1 puff into the lungs daily as needed (shortness of breath). 06/30/19  Yes [provider]  calcitRIOL (ROCALTROL) 0.25 MCG capsule Take 0.25 mcg by mouth daily.  03/14/19  Yes [provider]  COLACE 100 MG capsule Take 100 mg by mouth daily as needed for mild constipation.  03/14/19  Yes [provider]  cyclobenzaprine (FLEXERIL) 5 MG tablet Take 5 mg by mouth 3 (three) times daily as needed for muscle spasms. 01/15/21  Yes [provider]  ergocalciferol (VITAMIN D2) 1.25 MG (50000 UT) capsule Take 50,000 Units by mouth every Sunday.   Yes [provider]  ferric citrate (AURYXIA) 1 GM 210 MG(Fe) tablet Take 210 mg by mouth 3 (three) times daily with meals.   Yes [provider]  fluticasone (FLONASE) 50 MCG/ACT nasal spray Place 1 spray into both nostrils daily as needed for allergies or rhinitis. 01/15/21  Yes [provider]  loratadine-pseudoephedrine (CLARITIN-D 12 HOUR) 5-120 MG tablet Take 1 tablet by mouth 2 (two) times daily as needed for allergies.   Yes [provider]  Melatonin 5 MG TABS Take 5 mg by mouth at bedtime as needed (for sleep).    Yes [provider]  midodrine (PROAMATINE) 5 MG tablet Take 1 tablet (5 mg total) by mouth 3 (three) times daily with meals. 08/21/19  Yes  Amin, Jeanella Flattery, MD  multivitamin (RENA-VIT) TABS tablet Take 1 tablet by mouth daily.   Yes [provider]  ondansetron (ZOFRAN-ODT) 4 MG disintegrating tablet Take 4 mg by mouth every 6 (six) hours as needed for nausea. 04/20/21  Yes [provider]  pantoprazole (PROTONIX) 40 MG tablet Take 1 tablet (40 mg total) by mouth daily. 10/20/15  Yes Biagio Borg, MD  Potassium Chloride ER 20 MEQ TBCR Take 20 mEq by mouth daily.  05/24/19  Yes [provider]  sodium chloride (OCEAN) 0.65 % SOLN nasal spray Place 2 sprays into both nostrils daily as needed for congestion.    Yes [provider]  Sodium Chloride-Sodium Bicarb (NETI POT SINUS WASH) 2300-700 MG KIT Place 1 application into the nose 2 (two) times daily as needed. Patient taking differently: Place 1 application into the nose 2 (two) times daily as needed (congestion). 04/03/21  Yes Isla Pence, MD  torsemide (DEMADEX) 20 MG tablet Take 20 mg by mouth daily.  01/03/18  Yes [provider]  amoxicillin-clavulanate (AUGMENTIN) 875-125 MG tablet Take 1 tablet by mouth every 12 (twelve) hours. Patient not taking: Reported on 03/19/2021 02/17/21   Sherrell Puller, PA-C  atorvastatin (LIPITOR) 10 MG tablet Take 1 tablet (10 mg total) by mouth daily. Patient not taking: Reported on 03/19/2021 10/20/15   Biagio Borg, MD  benzonatate (TESSALON) 100 MG capsule Take 1 capsule (100 mg total) by mouth every 8 (eight) hours. Patient not taking: Reported on 04/23/2021 04/03/21   Isla Pence, MD    Scheduled Meds:  aspirin EC  81 mg Oral Daily   atorvastatin  20 mg Oral Daily   calcitRIOL  0.25 mcg Oral Daily   [START ON 04/24/2021] cosyntropin  0.25 mg Intravenous Once   feeding supplement  237 mL Oral BID BM   gentamicin cream  1 application Topical Daily   heparin  5,000 Units Subcutaneous Q8H   midodrine  5 mg Oral TID WC   pantoprazole  40 mg Oral Daily   potassium chloride  20 mEq Oral BID    Infusions:  sodium chloride 100 mL/hr at 04/23/21 1532   dialysis solution 1.5% low-MG/low-CA     potassium chloride 10 mEq (04/23/21 1535)   sodium chloride     PRN Meds: ondansetron (ZOFRAN) IV, prochlorperazine   Allergies as of 04/22/2021 - Review Complete 04/22/2021  Allergen Reaction Noted   Ace inhibitors Anaphylaxis, Swelling, and Other (See Comments) 04/25/2008   Calcium channel blockers Anaphylaxis, Itching, Rash, and Other (See Comments) 06/25/2019   Diltiazem Anaphylaxis 02/05/2018   Other Anaphylaxis, Itching, Rash, and Other (See Comments) 06/26/2014   Pneumococcal vaccines Other (See Comments) 06/26/2014   Tetanus toxoid Swelling and Other (See Comments) 12/25/2007   Latex Rash 02/05/2018   Tape Rash and Other (See Comments) 02/05/2018    Family History  Problem Relation Age of Onset   Throat cancer Father    Hypertension Sister    Thyroid disease Daughter    Hypertension Sister        x 2   Breast cancer Maternal Aunt    Blindness Sister        legally blind   Colon cancer Neg Hx    Stomach cancer Neg Hx     Social History   Socioeconomic History   Marital status: Divorced    Spouse name: Not on file   Number of children: 2   Years of education: 12   Highest education level: Not on file  Occupational History   Occupation: Retired Chartered certified accountant  Tobacco Use   Smoking status: Former    Packs/day: 0.12    Years: 8.00    Pack years: 0.96    Types: Cigarettes    Quit date: 05/04/1985    Years since quitting: 35.9   Smokeless tobacco: Never  Vaping Use   Vaping Use: Never used  Substance and Sexual Activity   Alcohol use: Yes    Alcohol/week: 1.0 standard drink    Types: 1 Cans of beer per week   Drug use: No   Sexual activity: Not Currently  Other Topics Concern   Not on file  Social  History Narrative   Lives alone   Caffeine -tea, 1 cup daily;  Pepsi maybe one a day   Social Determinants of Health   Financial Resource Strain: Not on  file  Food Insecurity: Not on file  Transportation Needs: Not on file  Physical Activity: Not on file  Stress: Not on file  Social Connections: Not on file  Intimate Partner Violence: Not on file    REVIEW OF SYSTEMS: Constitutional: Weakness. ENT:  No nose bleeds Pulm: No dyspnea.  No cough. CV:  No palpitations, no LE edema.  GU:  No hematuria, no frequency GI: Per HPI. Heme: No unusual or excessive bleeding or bruising. Transfusions: She recalls transfusions several years ago when she had worsening kidney disease but none in recent years. Neuro:  No headaches, no peripheral tingling or numbness Derm:  No itching, no rash or sores.  Endocrine:  No sweats or chills.  No polyuria or dysuria Immunization: Reviewed. Travel: Not queried.   PHYSICAL EXAM: Vital signs in last 24 hours: Vitals:   04/23/21 1403 04/23/21 1500  BP: (!) 103/56 (!) 86/51  Pulse:  70  Resp: 14 12  Temp:    SpO2:  98%   Wt Readings from Last 3 Encounters:  04/22/21 43 kg  04/03/21 42.2 kg  03/19/21 47.6 kg    General: Thin, comfortable, frail but not acutely ill-appearing.  Alert and provides good history. Head: No signs of head trauma.  No facial edema or asymmetry. Eyes: Conjunctiva pink. Ears: Not hard of hearing Nose: No discharge or congestion Mouth: Oral mucosa somewhat dry.  No lesions or exudates.  Tongue midline Neck: No JVD, no masses, no thyromegaly Lungs: No labored breathing.  No cough.  Lungs clear with good breath sounds bilaterally. Heart: RRR.  No MRG.  S1, S2 present. Abdomen: Soft, thin, nondistended.  Active bowel sounds.  No HSM, masses, bruits, hernias.  PD catheter insertion site on the left covered with clean bandage not removed for exam..   Rectal: Deferred Musc/Skeltl: Thin arms and legs. Extremities: No CCE Neurologic: Oriented x3.  Able to provide clear history.  Moves all 4 limbs without gross weakness or tremor. Skin: No rash, no sores, no suspicious  lesions. Nodes: No cervical adenopathy. Psych: Calm, pleasant, cooperative.  Intake/Output from previous day: No intake/output data recorded. Intake/Output this shift: No intake/output data recorded.  LAB RESULTS: Recent Labs    04/22/21 2047  WBC 5.9  HGB 11.2*  HCT 34.5*  PLT 254   BMET Lab Results  Component Value Date   NA 135 04/23/2021   NA 133 (L) 04/22/2021   NA 133 (L) 04/12/2021   K 2.9 (L) 04/23/2021   K 2.8 (L) 04/22/2021   K 2.7 (LL) 04/12/2021   CL 99 04/23/2021   CL 98 04/22/2021   CL 95 (L) 04/12/2021   CO2 21 (L) 04/23/2021   CO2 20 (L) 04/22/2021   CO2 21 (L) 04/12/2021   GLUCOSE 94 04/23/2021   GLUCOSE 125 (H) 04/22/2021   GLUCOSE 61 (L) 04/12/2021   BUN 27 (H) 04/23/2021   BUN 27 (H) 04/22/2021   BUN 37 (H) 04/12/2021   CREATININE 12.42 (H) 04/23/2021   CREATININE 12.39 (H) 04/22/2021   CREATININE 14.20 (H) 04/12/2021   CALCIUM 8.3 (L) 04/23/2021   CALCIUM 8.1 (L) 04/22/2021   CALCIUM 8.9 04/12/2021   LFT Recent Labs    04/22/21 2047  PROT 5.6*  ALBUMIN 2.9*  AST 31  ALT 19  ALKPHOS 66  BILITOT 0.9   PT/INR Lab Results  Component Value Date   INR 1.36 07/20/2016   INR 0.96 03/09/2009   INR 1.0 11/04/2008   Hepatitis Panel No results for input(s): HEPBSAG, HCVAB, HEPAIGM, HEPBIGM in the last 72 hours. C-Diff No components found for: CDIFF Lipase     Component Value Date/Time   LIPASE 40 02/05/2018 1814    Drugs of Abuse  No results found for: LABOPIA, COCAINSCRNUR, LABBENZ, AMPHETMU, THCU, LABBARB   RADIOLOGY STUDIES: DG Chest Portable 1 View  Result Date: 04/22/2021 CLINICAL DATA:  Weakness EXAM: PORTABLE CHEST 1 VIEW COMPARISON:  04/12/2021 FINDINGS: There is hyperinflation of the lungs compatible with COPD. Mild elevation of the left hemidiaphragm. Postoperative changes in the left lung. No confluent airspace opacities or effusions. Heart is normal size. No acute bony abnormality. IMPRESSION: COPD/chronic changes.   No active disease. Electronically Signed   By: Rolm Baptise M.D.   On: 04/22/2021 21:12      IMPRESSION:   Dysphagia.  Rule out esophageal spasm, rule out esophageal stricture, rule out infectious esophagitis.  EGD 2016 and barium esophagram 2017 for same.  Noted epiphrenic diverticulum, H. pylori negative gastritis, GERD.  Weight loss associated with decreased p.o. intake.  ESRD.  Has been on peritoneal dialysis for about 4 years.  Hypokalemia.  Minor, normocytic anemia.    PLAN:     EGD/?timing, ?  Pursue barium swallow/esophagram beforehand?  Attending MD has consulted speech-language pathology.  For purposes of esophageal evaluation, prefer esophagram and not a modified barium swallow.   Azucena Freed  04/23/2021, 4:12 PM Phone [phone]

## 2021-04-23 NOTE — Progress Notes (Signed)
PROGRESS NOTE  [name] RDE:[id number] DOB: [birth year] DOA: 04/22/2021 PCP: [facility]  Brief History   [name] is a 75 y.o. female with medical history significant for ESRD on PD, hypertension, COPD, GERD who presents with multiple concerns.   She reports that since September she has been dealing with some sinus issues that she feels is still persistent.  Since that time she has been having trouble with solids feeling stuck with swallowing.  She is able to take liquids but would have nausea and vomiting immediately afterwards.  Also having persistent diarrhea.  Denies any abdominal pain or issues with her peritoneal dialysis.  No fever. She reports about a 25 pound weight loss since these symptoms started.  Currently she is feeling so weak she is having trouble doing bathing or even holding a spoon to eat.  Her blood pressure also drops with ambulation.   She has presented to ER numerous time for these issues and told she is dehydrated and has hypokalemia.  She has planned follow-up with GI outpatient but appointment is not until next week.  She was recently placed on midodrine TID PRN but has only been able to take it at most 1 time a day due to her nausea and vomiting. Her peritoneal dialysis recently has also been increased from 4 to 5 hours.   ED Course: She was afebrile, borderline hypotensive with BP of 95/60. She has no leukocytosis or anemia.  However lactate was elevated 2.5 but improved down to 1.3 following IV fluid. Sodium of 133, K of 2.8, chloride of 98, CO2 20, creatinine of 12.39 which is near baseline.  BG of 125.   Chest x-ray is negative.   She was given 500 cc of normal saline bolus and 10 mEq of IV potassium.  Hospitalist then called for admission for hypotension and failure to thrive.  The patient is resting comfortably. She is nauseated.  Consultants  SLP  Procedures  None  Antibiotics   Anti-infectives (From admission, onward)     None      Subjective  The patient is resting comfortably. She is nauseated.  Objective   Vitals:  Vitals:   04/23/21 1401 04/23/21 1403  BP:  (!) 103/56  Pulse: 71   Resp: 14 14  Temp:    SpO2: 97%     Exam:  Constitutional:  The patient is awake, alert, and oriented x 3. No acute distress. Respiratory:  No increased work of breathing. No wheezes, rales, or rhonchi No tactile fremitus Cardiovascular:  Regular rate and rhythm No murmurs, ectopy, or gallups. No lateral PMI. No thrills. Abdomen:  Abdomen is soft, non-tender, non-distended No hernias, masses, or organomegaly Normoactive bowel sounds.  Musculoskeletal:  No cyanosis, clubbing, or edema Skin:  No rashes, lesions, ulcers palpation of skin: no induration or nodules Neurologic:  CN 2-12 intact Sensation all 4 extremities intact Psychiatric:  Mental status Mood, affect appropriate Orientation to person, place, time  judgment and insight appear intact  I have personally reviewed the following:   Today's Data  Vitals  Lab Data  BMP  Micro Data  Blood culture - no growth  Imaging  CXR - negative  Cardiology Data  EKG  Scheduled Meds:  aspirin EC  81 mg Oral Daily   atorvastatin  20 mg Oral Daily   calcitRIOL  0.25 mcg Oral Daily   [START ON 04/24/2021] cosyntropin  0.25 mg Intravenous Once   feeding supplement  237 mL Oral BID BM  heparin  5,000 Units Subcutaneous Q8H   midodrine  5 mg Oral TID WC   pantoprazole  40 mg Oral Daily   potassium chloride  20 mEq Oral BID   Principal Problem:   Hypotension Active Problems:   ESRD (end stage renal disease) on dialysis (HCC)   Failure to thrive in adult   Dysphagia   LOS: 0 days   A & P  Assessment/Plan   Failure to thrive Dysphagia/presistent N/V and diarrhea  -Patient has had dysphagia for several months with GI outpatient follow-up next week.  She has had significant 25 pound weight loss due to this issue.  Also has been  having nausea vomiting and diarrhea.  Cannot rule out potential malignancy but will need endoscopy.  However, currently having persistent hypotension that would preclude her from getting endoscopy during this admission -will work on improving her BP and have her continue to follow up outpatient with GI , - SLP has been consulted for initial evaluation of dyphagia. -Will also consult GI. It seems that given her 25 lb weight loss in the last 3 months.   Hypotension Improved although chronic. Pt is on midodrine 10 mg tid.    Hypokalemia Given IV 49meq in ED. Will give BID oral supplementation   ESRD on PD Last session on 12/14 need nephrology consult in the morning to assist with PD and also appreciate recs for persistent hypotension since she recently had sessions increased from 4 to 5 hours. Nephrology has been consulted.   DVT prophylaxis:.Subcu heparin Code Status: Full Family Communication: none available disposition Plan: Home with observation  [name] Matsuoka, DO [facility] Direct contact: see [email or web address]  7PM-7AM contact night coverage as above 04/23/2021, 2:38 PM  LOS: 0 days

## 2021-04-23 NOTE — H&P (Signed)
History and Physical    [name] OBS:[id number] DOB: [birth year] DOA: 04/22/2021  PCP: [facility]  Patient coming from: Home  I have personally briefly reviewed patient's old medical records in [place]  Chief Complaint: Persistent dysphagia, nausea vomiting diarrhea  HPI: [name] is a 75 y.o. female with medical history significant for ESRD on PD, hypertension, COPD, GERD who presents with multiple concerns.  She reports that since September she has been dealing with some sinus issues that she feels is still persistent.  Since that time she has been having trouble with solids feeling stuck with swallowing.  She is able to take liquids but would have nausea and vomiting immediately afterwards.  Also having persistent diarrhea.  Denies any abdominal pain or issues with her peritoneal dialysis.  No fever. She reports about a 25 pound weight loss since these symptoms started.  Currently she is feeling so weak she is having trouble doing bathing or even holding a spoon to eat.  Her blood pressure also drops with ambulation.  She has presented to ER numerous time for these issues and told she is dehydrated and has hypokalemia.  She has planned follow-up with GI outpatient but appointment is not until next week.  She was recently placed on midodrine TID PRN but has only been able to take it at most 1 time a day due to her nausea and vomiting. Her peritoneal dialysis recently has also been increased from 4 to 5 hours.  ED Course: She was afebrile, borderline hypotensive with BP of 95/60. She has no leukocytosis or anemia.  However lactate was elevated 2.5 but improved down to 1.3 following IV fluid. Sodium of 133, K of 2.8, chloride of 98, CO2 20, creatinine of 12.39 which is near baseline.  BG of 125.  Chest x-ray is negative.  She was given 500 cc of normal saline bolus and 10 mEq of IV potassium.  Hospitalist then called for admission for hypotension and  failure to thrive. Review of Systems:  constitutional: No Weight Change, No Fever ENT/Mouth: No sore throat, No Rhinorrhea Eyes: No Eye Pain, No Vision Changes Cardiovascular: No Chest Pain, no SOB, , No Edema Respiratory: No Cough, + Sputum  Gastrointestinal: + Nausea, + Vomiting,+ Diarrhea, No Constipation, No Pain Genitourinary: no Urinary Incontinence Musculoskeletal: No Arthralgias, No Myalgias Skin: No Skin Lesions, No Pruritus, Neuro: + Weakness, No Numbness Psych: + decrease appetite Heme/Lymph: No Bruising, No Bleeding   Past Medical History:  Diagnosis Date   Acute kidney injury ([place]) 07/20/2016   Allergic rhinitis    Anxiety    CAP (community acquired pneumonia) 06/2016   [name] 06/19/2016   Cervical disc disease    /notes 07/20/2016   Chest pain at rest 06/18/2012   COPD (chronic obstructive pulmonary disease) ([place]) 05/23/2012   Diverticulosis    DJD (degenerative joint disease)    Esophageal diverticulum    Esophageal stricture    ESRD (end stage renal disease) (HCC)    GERD (gastroesophageal reflux disease)    Hiatal hernia    History of blood transfusion 06/2016   "when I was hospitalized w/sepsis"   Hyperlipidemia 05/23/2012   T. Chol 234, LDL 130    Hypertension    IBS (irritable bowel syndrome)    Lung nodules    hx  with extensive workup including lung biopsy ruling out Sjogren's disease/notes 07/20/2016   Osteopenia    Recurrent sinusitis    /noters 07/20/2016   Renal insufficiency  Sinus headache    "daily lately" (07/20/2016)   Traumatic arthritis     Past Surgical History:  Procedure Laterality Date   ANTERIOR CERVICAL DECOMP/DISCECTOMY FUSION  2005   BACK SURGERY     COLONOSCOPY  2001   /notes 09/21/2010   CYSTECTOMY Bilateral    hx/notes 09/21/2010   IR GENERIC HISTORICAL  07/20/2016   IR FLUORO GUIDE CV LINE RIGHT 07/20/2016 [name], MD [facility]   IR GENERIC HISTORICAL  07/20/2016   IR US GUIDE VASC ACCESS RIGHT 07/20/2016 [name], MD [facility]   LUNG BIOPSY Left    left side, not cancerous, thoracotomy   TONSILLECTOMY AND ADENOIDECTOMY     hx/notes 09/21/2010   VAGINAL HYSTERECTOMY     hx w/oophorectomy/notes 09/21/2010     reports that she quit smoking about 35 years ago. Her smoking use included cigarettes. She has a 0.96 pack-year smoking history. She has never used smokeless tobacco. She reports current alcohol use of about 1.0 standard drink per week. She reports that she does not use drugs. Social History  Allergies  Allergen Reactions   Ace Inhibitors Anaphylaxis, Swelling and Other (See Comments)    Angioedema   Calcium Channel Blockers Anaphylaxis, Itching, Rash and Other (See Comments)    Red rash, Knot (sore and itches), and a fever for a couple days (per pt report)   Diltiazem Anaphylaxis   Other Anaphylaxis, Itching, Rash and Other (See Comments)    Calcium Channel Blocking Agent Diltiazem Analogues: Red rash, Knot (sore and itches), and a fever for a couple days (per pt report)   Pneumococcal Vaccines Other (See Comments)    Red rash, Knot (sore and itches), A fever for a couple days (per pt report)   Tetanus Toxoid Swelling and Other (See Comments)    FEVER and cellulitis in an arm     Latex Rash    Reaction to gloves   Tape Rash and Other (See Comments)    PLEASE USE PAPER TAPE!!    Family History  Problem Relation Age of Onset   Throat cancer Father    Hypertension Sister    Thyroid disease Daughter    Hypertension Sister        x 2   Breast cancer Maternal Aunt    Blindness Sister        legally blind   Colon cancer Neg Hx    Stomach cancer Neg Hx      Prior to Admission medications   Medication Sig Start Date End Date Taking? Authorizing Provider  acetaminophen (TYLENOL) 500 MG tablet Take 1,000 mg by mouth every 6 (six) hours as needed (for headaches or pain).     [provider]  albuterol (VENTOLIN HFA) 108 (90 Base) MCG/ACT inhaler Inhale 1-2 puffs  into the lungs every 6 (six) hours as needed for wheezing or shortness of breath. 12/16/19   Parrett, Fonnie Mu, NP  amoxicillin-clavulanate (AUGMENTIN) 875-125 MG tablet Take 1 tablet by mouth every 12 (twelve) hours. Patient not taking: Reported on 03/19/2021 02/17/21   Sherrell Puller, PA-C  aspirin EC 81 MG tablet Take 81 mg by mouth daily.    [provider]  atorvastatin (LIPITOR) 10 MG tablet Take 1 tablet (10 mg total) by mouth daily. Patient not taking: Reported on 03/19/2021 10/20/15   Biagio Borg, MD  atorvastatin (LIPITOR) 20 MG tablet Take 20 mg by mouth daily. 02/24/21   [provider]  benzonatate (TESSALON) 100 MG capsule Take  1 capsule (100 mg total) by mouth every 8 (eight) hours. 04/03/21   Isla Pence, MD  BREO ELLIPTA 100-25 MCG/INH AEPB Inhale 1 puff into the lungs daily.  06/30/19   [provider]  calcitRIOL (ROCALTROL) 0.25 MCG capsule Take 0.25 mcg by mouth daily.  03/14/19   [provider]  COLACE 100 MG capsule Take 100 mg by mouth daily as needed for mild constipation.  03/14/19   [provider]  cyclobenzaprine (FLEXERIL) 5 MG tablet Take 5 mg by mouth 3 (three) times daily as needed for muscle spasms. 01/15/21   [provider]  ergocalciferol (VITAMIN D2) 1.25 MG (50000 UT) capsule Take 50,000 Units by mouth every Sunday.    [provider]  ferric citrate (AURYXIA) 1 GM 210 MG(Fe) tablet Take 210 mg by mouth 3 (three) times daily with meals.    [provider]  fluticasone (FLONASE) 50 MCG/ACT nasal spray Place 1 spray into both nostrils daily as needed for allergies or rhinitis. 01/15/21   [provider]  lactulose (CHRONULAC) 10 GM/15ML solution Take 10 g by mouth daily as needed for mild constipation.    [provider]  loratadine-pseudoephedrine (CLARITIN-D 12 HOUR) 5-120 MG tablet Take 1 tablet by mouth 2 (two) times daily as needed for allergies.    [provider]   Melatonin 5 MG TABS Take 5 mg by mouth at bedtime as needed (for sleep).     [provider]  midodrine (PROAMATINE) 5 MG tablet Take 1 tablet (5 mg total) by mouth 3 (three) times daily with meals. Patient not taking: No sig reported 08/21/19   Damita Lack, MD  multivitamin (RENA-VIT) TABS tablet Take 1 tablet by mouth daily.    [provider]  pantoprazole (PROTONIX) 40 MG tablet Take 1 tablet (40 mg total) by mouth daily. 10/20/15   Biagio Borg, MD  Potassium Chloride ER 20 MEQ TBCR Take 20 mEq by mouth daily.  05/24/19   [provider]  sodium chloride (OCEAN) 0.65 % SOLN nasal spray Place 2 sprays into both nostrils daily as needed for congestion.     [provider]  Sodium Chloride-Sodium Bicarb (NETI POT SINUS [place]) 2300-700 MG KIT Place 1 application into the nose 2 (two) times daily as needed. 04/03/21   Isla Pence, MD  torsemide (DEMADEX) 20 MG tablet Take 20 mg by mouth daily.  01/03/18   [provider]    Physical Exam: Vitals:   04/22/21 2215 04/22/21 2230 04/22/21 2300 04/23/21 0005  BP: _0 98/71  Pulse: 72  86 89  Resp: (!) _1 Temp:      SpO2: 94%  100% 100%  Weight:      Height:        Constitutional: NAD, calm, comfortable, thin cachectic elderly female sitting upright in bed Vitals:   04/22/21 2215 04/22/21 2230 04/22/21 2300 04/23/21 0005  BP: _2 98/71  Pulse: 72  86 89  Resp: (!) _3 Temp:      SpO2: 94%  100% 100%  Weight:      Height:       Eyes: lids and conjunctivae normal ENMT: Mucous membranes are moist.  Neck: normal, supple Respiratory: clear to auscultation bilaterally, no wheezing, no crackles. Normal respiratory effort. No accessory muscle use.  Cardiovascular: Regular rate and rhythm, no murmurs / rubs / gallops. No extremity edema.  Abdomen: no tenderness, no  masses palpated.  Peritoneal dialysis catheter in place without any active  signs of infection musculoskeletal: no clubbing / cyanosis. No joint deformity upper and lower extremities.  Muscle wasting in all extremities.   Neurologic: CN 2-12 grossly intact. Strength 5/5 in all 4.  Psychiatric: Normal judgment and insight. Alert and oriented x 3. Normal mood.     Labs on Admission: I have personally reviewed following labs and imaging studies  CBC: Recent Labs  Lab 04/22/21 2047  WBC 5.9  NEUTROABS 4.7  HGB 11.2*  HCT 34.5*  MCV 99.1  PLT 875   Basic Metabolic Panel: Recent Labs  Lab 04/22/21 2047  NA 133*  K 2.8*  CL 98  CO2 20*  GLUCOSE 125*  BUN 27*  CREATININE 12.39*  CALCIUM 8.1*   GFR: Estimated Creatinine Clearance: 2.6 mL/min (A) (by C-G formula based on SCr of 12.39 mg/dL (H)). Liver Function Tests: Recent Labs  Lab 04/22/21 2047  AST 31  ALT 19  ALKPHOS 66  BILITOT 0.9  PROT 5.6*  ALBUMIN 2.9*   No results for input(s): LIPASE, AMYLASE in the last 168 hours. No results for input(s): AMMONIA in the last 168 hours. Coagulation Profile: No results for input(s): INR, PROTIME in the last 168 hours. Cardiac Enzymes: No results for input(s): CKTOTAL, CKMB, CKMBINDEX, TROPONINI in the last 168 hours. BNP (last 3 results) No results for input(s): PROBNP in the last 8760 hours. HbA1C: No results for input(s): HGBA1C in the last 72 hours. CBG: No results for input(s): GLUCAP in the last 168 hours. Lipid Profile: No results for input(s): CHOL, HDL, LDLCALC, TRIG, CHOLHDL, LDLDIRECT in the last 72 hours. Thyroid Function Tests: No results for input(s): TSH, T4TOTAL, FREET4, T3FREE, THYROIDAB in the last 72 hours. Anemia Panel: No results for input(s): VITAMINB12, FOLATE, FERRITIN, TIBC, IRON, RETICCTPCT in the last 72 hours. Urine analysis:    Component Value Date/Time   COLORURINE YELLOW 06/25/2019 2050   APPEARANCEUR HAZY (A) 06/25/2019 2050   LABSPEC 1.016 06/25/2019 2050   PHURINE 5.0 06/25/2019 2050   GLUCOSEU NEGATIVE  06/25/2019 2050   GLUCOSEU NEGATIVE 06/03/2014 1038   HGBUR NEGATIVE 06/25/2019 2050   [place] NEGATIVE 06/25/2019 2050   KETONESUR NEGATIVE 06/25/2019 2050   PROTEINUR NEGATIVE 06/25/2019 2050   UROBILINOGEN 0.2 06/03/2014 1038   NITRITE NEGATIVE 06/25/2019 2050   LEUKOCYTESUR TRACE (A) 06/25/2019 2050    Radiological Exams on Admission: DG Chest Portable 1 View  Result Date: 04/22/2021 CLINICAL DATA:  Weakness EXAM: PORTABLE CHEST 1 VIEW COMPARISON:  04/12/2021 FINDINGS: There is hyperinflation of the lungs compatible with COPD. Mild elevation of the left hemidiaphragm. Postoperative changes in the left lung. No confluent airspace opacities or effusions. Heart is normal size. No acute bony abnormality. IMPRESSION: COPD/chronic changes.  No active disease. Electronically Signed   By: Rolm Baptise M.D.   On: 04/22/2021 21:12      Assessment/Plan  Failure to thrive Dysphagia/presistent N/V and diarrhea  -Patient has had dysphagia for several months with GI outpatient follow-up next week.  She has had significant 25 pound weight loss due to this issue.  Also has been having nausea vomiting and diarrhea.  Cannot rule out potential malignancy but will need endoscopy.  However, currently having persistent hypotension that would preclude her from getting endoscopy during this admission -will work on improving her BP and have her continue to follow up outpatient with GI   Hypotension continue midodrine TID scheduled rather than PRN. Given IV antiemetic to help  with any nausea symptoms to keep medication down  Hypokalemia Given IV 5mq in ED. Will give BID oral supplementation  ESRD on PD Last session on 12/14 need nephrology consult in the morning to assist with PD and also appreciate recs for persistent hypotension since she recently had sessions increased from 4 to 5 hours  DVT prophylaxis:.Subcu heparin Code Status: Full Family Communication: Plan discussed with patient at  bedside  disposition Plan: Home with observation Consults called:  Admission status: Observation    Level of care: Telemetry Medical  Status is: Observation  The patient remains OBS appropriate and will d/c before 2 midnights.        COrene DesanctisDO [facility]   If 7PM-7AM, please contact night-coverage [email or web address]   04/23/2021, 1:34 AM

## 2021-04-24 ENCOUNTER — Encounter (HOSPITAL_COMMUNITY): Payer: Self-pay | Admitting: Certified Registered"

## 2021-04-24 ENCOUNTER — Encounter (HOSPITAL_COMMUNITY)
Admission: EM | Disposition: A | Discharge status: Home Health | Payer: Self-pay | Source: Home / Self Care | Attending: Internal Medicine

## 2021-04-24 DIAGNOSIS — R634 Abnormal weight loss: Secondary | ICD-10-CM | POA: Diagnosis present

## 2021-04-24 DIAGNOSIS — Z91048 Other nonmedicinal substance allergy status: Secondary | ICD-10-CM | POA: Diagnosis not present

## 2021-04-24 DIAGNOSIS — I959 Hypotension, unspecified: Secondary | ICD-10-CM

## 2021-04-24 DIAGNOSIS — J849 Interstitial pulmonary disease, unspecified: Secondary | ICD-10-CM | POA: Diagnosis present

## 2021-04-24 DIAGNOSIS — Z87891 Personal history of nicotine dependence: Secondary | ICD-10-CM | POA: Diagnosis not present

## 2021-04-24 DIAGNOSIS — I2721 Secondary pulmonary arterial hypertension: Secondary | ICD-10-CM | POA: Diagnosis present

## 2021-04-24 DIAGNOSIS — K259 Gastric ulcer, unspecified as acute or chronic, without hemorrhage or perforation: Secondary | ICD-10-CM | POA: Diagnosis present

## 2021-04-24 DIAGNOSIS — Q396 Congenital diverticulum of esophagus: Secondary | ICD-10-CM | POA: Diagnosis not present

## 2021-04-24 DIAGNOSIS — R1310 Dysphagia, unspecified: Secondary | ICD-10-CM | POA: Diagnosis not present

## 2021-04-24 DIAGNOSIS — Y929 Unspecified place or not applicable: Secondary | ICD-10-CM | POA: Diagnosis not present

## 2021-04-24 DIAGNOSIS — K2210 Ulcer of esophagus without bleeding: Secondary | ICD-10-CM | POA: Diagnosis present

## 2021-04-24 DIAGNOSIS — Z888 Allergy status to other drugs, medicaments and biological substances status: Secondary | ICD-10-CM | POA: Diagnosis not present

## 2021-04-24 DIAGNOSIS — K225 Diverticulum of esophagus, acquired: Secondary | ICD-10-CM | POA: Diagnosis not present

## 2021-04-24 DIAGNOSIS — K209 Esophagitis, unspecified without bleeding: Secondary | ICD-10-CM | POA: Diagnosis not present

## 2021-04-24 DIAGNOSIS — T18128A Food in esophagus causing other injury, initial encounter: Secondary | ICD-10-CM | POA: Diagnosis present

## 2021-04-24 DIAGNOSIS — E872 Acidosis, unspecified: Secondary | ICD-10-CM | POA: Diagnosis present

## 2021-04-24 DIAGNOSIS — Z91040 Latex allergy status: Secondary | ICD-10-CM | POA: Diagnosis not present

## 2021-04-24 DIAGNOSIS — N186 End stage renal disease: Secondary | ICD-10-CM | POA: Diagnosis present

## 2021-04-24 DIAGNOSIS — Z20822 Contact with and (suspected) exposure to covid-19: Secondary | ICD-10-CM | POA: Diagnosis present

## 2021-04-24 DIAGNOSIS — R1319 Other dysphagia: Secondary | ICD-10-CM | POA: Diagnosis not present

## 2021-04-24 DIAGNOSIS — E876 Hypokalemia: Secondary | ICD-10-CM | POA: Diagnosis present

## 2021-04-24 DIAGNOSIS — Z681 Body mass index (BMI) 19 or less, adult: Secondary | ICD-10-CM | POA: Diagnosis not present

## 2021-04-24 DIAGNOSIS — Z992 Dependence on renal dialysis: Secondary | ICD-10-CM | POA: Diagnosis not present

## 2021-04-24 DIAGNOSIS — D631 Anemia in chronic kidney disease: Secondary | ICD-10-CM | POA: Diagnosis present

## 2021-04-24 DIAGNOSIS — R933 Abnormal findings on diagnostic imaging of other parts of digestive tract: Secondary | ICD-10-CM | POA: Diagnosis not present

## 2021-04-24 DIAGNOSIS — K219 Gastro-esophageal reflux disease without esophagitis: Secondary | ICD-10-CM | POA: Diagnosis present

## 2021-04-24 DIAGNOSIS — R627 Adult failure to thrive: Secondary | ICD-10-CM | POA: Diagnosis present

## 2021-04-24 DIAGNOSIS — E042 Nontoxic multinodular goiter: Secondary | ICD-10-CM | POA: Diagnosis present

## 2021-04-24 DIAGNOSIS — E785 Hyperlipidemia, unspecified: Secondary | ICD-10-CM | POA: Diagnosis present

## 2021-04-24 DIAGNOSIS — I120 Hypertensive chronic kidney disease with stage 5 chronic kidney disease or end stage renal disease: Secondary | ICD-10-CM | POA: Diagnosis present

## 2021-04-24 LAB — BASIC METABOLIC PANEL
Anion gap: 10 (ref 5–15)
BUN: 24 mg/dL — ABNORMAL HIGH (ref 8–23)
CO2: 23 mmol/L (ref 22–32)
Calcium: 7.6 mg/dL — ABNORMAL LOW (ref 8.9–10.3)
Chloride: 101 mmol/L (ref 98–111)
Creatinine, Ser: 10.64 mg/dL — ABNORMAL HIGH (ref 0.44–1.00)
GFR, Estimated: 3 mL/min — ABNORMAL LOW (ref >60)
Glucose, Bld: 80 mg/dL (ref 70–99)
Potassium: 3.3 mmol/L — ABNORMAL LOW (ref 3.5–5.1)
Sodium: 134 mmol/L — ABNORMAL LOW (ref 135–145)

## 2021-04-24 SURGERY — ESOPHAGOGASTRODUODENOSCOPY (EGD) WITH PROPOFOL
Anesthesia: Monitor Anesthesia Care

## 2021-04-24 MED ORDER — SODIUM CHLORIDE 0.9 % IV BOLUS
500.0000 mL | Freq: Once | INTRAVENOUS | Status: AC
  Administered 2021-04-24: 500 mL via INTRAVENOUS

## 2021-04-24 MED ORDER — COSYNTROPIN 0.25 MG IJ SOLR
0.2500 mg | Freq: Once | INTRAMUSCULAR | Status: AC
  Administered 2021-04-25: 06:00:00 0.25 mg via INTRAVENOUS
  Filled 2021-04-24: qty 0.25

## 2021-04-24 NOTE — Plan of Care (Signed)
°  Problem: Education: Goal: Knowledge of General Education information will improve Description: Including pain rating scale, medication(s)/side effects and non-pharmacologic comfort measures Outcome: Progressing   Problem: Health Behavior/Discharge Planning: Goal: Ability to manage health-related needs will improve Outcome: Progressing   Problem: Nutrition: Goal: Adequate nutrition will be maintained Outcome: Progressing   Problem: Elimination: Goal: Will not experience complications related to bowel motility Outcome: Progressing   Problem: Pain Managment: Goal: General experience of comfort will improve Outcome: Progressing   Problem: Elimination: Goal: Will not experience complications related to bowel motility Outcome: Progressing   Problem: Pain Managment: Goal: General experience of comfort will improve Outcome: Progressing   Problem: Safety: Goal: Ability to remain free from injury will improve Outcome: Progressing   Problem: Skin Integrity: Goal: Risk for impaired skin integrity will decrease Outcome: Progressing

## 2021-04-24 NOTE — Progress Notes (Signed)
PD machine kept alarming that there was a line alarm d/t blockage. All lines repeatedly verified. No kinks or blockages. Called [phone] number on front of machine for support. Instructed to cancel treatment due to error in machine most likely from verification issues with one of the bags.  Support walked this RN though canceling treatment. RN was advised to contact medical team to see if pt needs draining.  Update: [id number]: on call HD RN came to assess PD machine. Machine will need to be switched out. Pt received 3 out of 5.

## 2021-04-24 NOTE — Progress Notes (Signed)
SLP Cancellation Note  Patient Details Name: [name] MRN: [id number] DOB: 03-22-1946   Cancelled treatment:       Reason Eval/Treat Not Completed: Other (comment). Patient currently NPO for EGD. Will f/u next date.   Anjenette Gerbino MA, CCC-SLP    Cailynn Bodnar Meryl 04/24/2021, 11:13 AM

## 2021-04-24 NOTE — Progress Notes (Signed)
PROGRESS NOTE  [name] HKV:[id number] DOB: June 15, 1945 DOA: 04/22/2021 [facility]: [facility]  Brief History   [name] is a 75 y.o. female with medical history significant for ESRD on PD, hypertension, COPD, GERD who presents with multiple concerns.   She reports that since September she has been dealing with some sinus issues that she feels is still persistent.  Since that time she has been having trouble with solids feeling stuck with swallowing.  She is able to take liquids but would have nausea and vomiting immediately afterwards.  Also having persistent diarrhea.  Denies any abdominal pain or issues with her peritoneal dialysis.  No fever. She reports about a 25 pound weight loss since these symptoms started.  Currently she is feeling so weak she is having trouble doing bathing or even holding a spoon to eat.  Her blood pressure also drops with ambulation.   She has presented to ER numerous time for these issues and told she is dehydrated and has hypokalemia.  She has planned follow-up with GI outpatient but appointment is not until next week.  She was recently placed on midodrine TID PRN but has only been able to take it at most 1 time a day due to her nausea and vomiting. Her peritoneal dialysis recently has also been increased from 4 to 5 hours.   ED Course: She was afebrile, borderline hypotensive with BP of 95/60. She has no leukocytosis or anemia.  However lactate was elevated 2.5 but improved down to 1.3 following IV fluid. Sodium of 133, K of 2.8, chloride of 98, CO2 20, creatinine of 12.39 which is near baseline.  BG of 125.   Chest x-ray is negative.   She was given 500 cc of normal saline bolus and 10 mEq of IV potassium.  Hospitalist then called for admission for hypotension and failure to thrive.  The patient is resting comfortably. She is nauseated.  Consultants  SLP  Procedures  None  Antibiotics   Anti-infectives (From admission, onward)     None      Subjective  The patient is resting comfortably. She is nauseated.  Objective   Vitals:  Vitals:   04/24/21 0824 04/24/21 1218  BP: (!) 82/47 (!) 90/47  Pulse: 81 82  Resp: 14 14  Temp: 98.3 F (36.8 C) 97.8 F (36.6 C)  SpO2: 97% 98%    Exam:  Constitutional:  The patient is awake, alert, and oriented x 3. No acute distress. Respiratory:  No increased work of breathing. No wheezes, rales, or rhonchi No tactile fremitus Cardiovascular:  Regular rate and rhythm No murmurs, ectopy, or gallups. No lateral PMI. No thrills. Abdomen:  Abdomen is soft, non-tender, non-distended No hernias, masses, or organomegaly Normoactive bowel sounds.  Musculoskeletal:  No cyanosis, clubbing, or edema Skin:  No rashes, lesions, ulcers palpation of skin: no induration or nodules Neurologic:  CN 2-12 intact Sensation all 4 extremities intact Psychiatric:  Mental status Mood, affect appropriate Orientation to person, place, time  judgment and insight appear intact  I have personally reviewed the following:   Today's Data  Vitals  Lab Data  BMP  Micro Data  Blood culture - no growth  Imaging  CXR - negative  Cardiology Data  EKG  Scheduled Meds:  aspirin EC  81 mg Oral Daily   atorvastatin  20 mg Oral Daily   calcitRIOL  0.25 mcg Oral Daily   feeding supplement  237 mL Oral BID BM   gentamicin cream  1 application Topical Daily   heparin  5,000 Units Subcutaneous Q8H   midodrine  5 mg Oral TID WC   pantoprazole  40 mg Oral Daily   potassium chloride  20 mEq Oral BID   Principal Problem:   Hypotension Active Problems:   ESRD (end stage renal disease) on dialysis (HCC)   Failure to thrive in adult   Dysphagia   LOS: 0 days   A & P  Assessment/Plan   Failure to thrive Dysphagia/presistent N/V and diarrhea  -Patient has had dysphagia for several months with GI outpatient follow-up next week.  She has had significant 25 pound weight loss due to  this issue.  Also has been having nausea vomiting and diarrhea.  Cannot rule out potential malignancy but will need endoscopy.  However, currently having persistent hypotension that would preclude her from getting endoscopy during this admission -will work on improving her BP and have her continue to follow up outpatient with GI , - SLP has been consulted for initial evaluation of dyphagia. -GI was consulted and plan was for endoscopy this morning. However, the patient has been hypotensive, and for that reason endoscopy has been delayed.    Hypotension Improved although chronic. Pt is on midodrine 10 mg tid. Cosyntropin stim test had been ordered for this morning, but time sensitive labs could not be managed this morning. Will order again this morning. Systolic pressures have been from 77-102. Diastolic pressures have been 47-102. If test cannot be performed in the am I will start on fludrocortisone. She is receiving IV fluids currently.   Hypokalemia Potassium 3.3 this morning. Supplement.   ESRD on PD Last session on 12/14 need nephrology consult in the morning to assist with PD and also appreciate recs for persistent hypotension since she recently had sessions increased from 4 to 5 hours. Nephrology has been consulted.   DVT prophylaxis:.Subcu heparin Code Status: Full Family Communication: none available disposition Plan: Home with observation  [name] Kwolek, DO [facility] Direct contact: see [email or web address]  7PM-7AM contact night coverage as above 04/24/2021, 4:55 PM  LOS: 0 days

## 2021-04-24 NOTE — Progress Notes (Signed)
° ° ° °   Progress Note   Subjective  Patient has had ongoing intermittent hypotension, BP 12X systolic. Anesthesia cancelled her EGD for this AM due to hypotension. She does endorse some diarrhea for the past few weeks but has not had any since she has been in the hospital.    Objective   Vital signs in last 24 hours: Temp:  [97.1 F (36.2 C)-99.6 F (37.6 C)] 97.8 F (36.6 C) (12/17 1218) Pulse Rate:  [70-92] 82 (12/17 1218) Resp:  [11-20] 14 (12/17 1218) BP: (77-111)/(47-88) 90/47 (12/17 1218) SpO2:  [97 %-100 %] 98 % (12/17 1218) Last BM Date: 04/23/21 General:    AA female in NAD Neurologic:  Alert and oriented,  grossly normal neurologically. Psych:  Cooperative. Normal mood and affect.  Intake/Output from previous day: 12/16 0701 - 12/17 0700 In: 8114.8 [I.V.:227.1; IV Piggyback:390.6] Out: 8130  Intake/Output this shift: No intake/output data recorded.  Lab Results: Recent Labs    04/22/21 2047  WBC 5.9  HGB 11.2*  HCT 34.5*  PLT 254   BMET Recent Labs    04/23/21 0441 04/23/21 2049 04/24/21 0651  NA 135 128* 134*  K 2.9* 3.9 3.3*  CL 99 99 101  CO2 21* 19* 23  GLUCOSE 94 131* 80  BUN 27* 28* 24*  CREATININE 12.42* 11.71* 10.64*  CALCIUM 8.3* 7.6* 7.6*   LFT Recent Labs    04/22/21 2047  PROT 5.6*  ALBUMIN 2.9*  AST 31  ALT 19  ALKPHOS 66  BILITOT 0.9   PT/INR No results for input(s): LABPROT, INR in the last 72 hours.  Studies/Results: DG Chest Portable 1 View  Result Date: 04/22/2021 CLINICAL DATA:  Weakness EXAM: PORTABLE CHEST 1 VIEW COMPARISON:  04/12/2021 FINDINGS: There is hyperinflation of the lungs compatible with COPD. Mild elevation of the left hemidiaphragm. Postoperative changes in the left lung. No confluent airspace opacities or effusions. Heart is normal size. No acute bony abnormality. IMPRESSION: COPD/chronic changes.  No active disease. Electronically Signed   By: Rolm Baptise M.D.   On: 04/22/2021 21:12        Assessment / Plan:    75 y/o female ESRD on PD, admitted with worsening dysphagia , weight loss. Also with a few weeks worth of loose stools but no diarrhea since admission. States she has an appetite but having solid food dysphagia with most meals, feels it in her upper esophagus mostly. Ongoing intermittent nausea and vomiting as well.   EGD with Dr. [name] in 2016 and has a epiphrenic diverticulum. Regarding her dysphagia, this could have enlarged, vs. other stricture or dysmotility. We discussed ddx. We planned for an EGD today but due to ongoing issues with hypotension - systolics in 51Z, her EGD was cancelled. Primary service has been working on this, on midodrine. She has not had any loose stools since being in the hospital. I have ordered a barium study with tablet, hopefully that can be done in the interim while awaiting EGD. If her blood pressures improve we can try to do the EGD tomorrow. If she has loose stools can send for C Diff (has recent ABx use) and fecal lactoferrin initially. Okay to proceed with speech path evaluation as well, would keep NPO after MN but can eat today.  Jolly Mango, MD [facility]

## 2021-04-24 NOTE — Progress Notes (Signed)
PD tx not achieved as expected, the PD machine failed after circle/drain #3.  The PD machine will be replaced and and tx will be resumed  tonight.

## 2021-04-24 NOTE — Care Management (Signed)
°  Transition of Care [facility]) Screening Note   Patient Details  Name: [name] Date of Birth: [birth year]   Transition of Care [facility]) CM/SW Contact:    Carles Collet, RN Phone Number: 04/24/2021, 1:48 PM    Transition of Care Department [facility]) has reviewed patient and no TOC needs have been identified at this time. We will continue to monitor patient advancement through interdisciplinary progression rounds. If new patient transition needs arise, please place a TOC consult.

## 2021-04-24 NOTE — Progress Notes (Signed)
Name: [name] MRN: [id number] DOB: 1945-09-29     Subjective: RN notified of patient being hypotensive to 77/48.   Objective:   Today's Vitals   04/23/21 2200 04/23/21 2306 04/24/21 0350 04/24/21 0635  BP: (!) 77/58 98/66 (!) 83/55 (!) 77/48  Pulse:   74   Resp: 11 19 13 13   Temp:  (!) 97.1 F (36.2 C) 99.6 F (37.6 C)   TempSrc:  Axillary Axillary   SpO2:   100%   Weight:      Height:      PainSc:  0-No pain     Body mass index is 18.51 kg/m.    Intake/Output Summary (Last 24 hours) at 04/24/2021 0657 Last data filed at 04/24/2021 0530 Gross per 24 hour  Intake 8114.78 ml  Output 8130 ml  Net -15.22 ml    Results for orders placed or performed during the hospital encounter of 04/22/21 (from the past 72 hour(s))  Lactic acid, plasma     Status: Abnormal   Collection Time: 04/22/21  8:47 PM  Result Value Ref Range   Lactic Acid, Venous 2.5 (HH) 0.5 - 1.9 mmol/L    Comment: CRITICAL RESULT CALLED TO, READ BACK BY AND VERIFIED WITH: LOZER C,RN 04/22/21 [place] Performed at [facility] [address] [place], [place] [address]   Comprehensive metabolic panel     Status: Abnormal   Collection Time: 04/22/21  8:47 PM  Result Value Ref Range   Sodium 133 (L) 135 - 145 mmol/L   Potassium 2.8 (L) 3.5 - 5.1 mmol/L   Chloride 98 98 - 111 mmol/L   CO2 20 (L) 22 - 32 mmol/L   Glucose, Bld 125 (H) 70 - 99 mg/dL    Comment: Glucose reference range applies only to samples taken after fasting for at least 8 hours.   BUN 27 (H) 8 - 23 mg/dL   Creatinine, Ser 12.39 (H) 0.44 - 1.00 mg/dL   Calcium 8.1 (L) 8.9 - 10.3 mg/dL   Total Protein 5.6 (L) 6.5 - 8.1 g/dL   Albumin 2.9 (L) 3.5 - 5.0 g/dL   AST 31 15 - 41 U/L   ALT 19 0 - 44 U/L   Alkaline Phosphatase 66 38 - 126 U/L   Total Bilirubin 0.9 0.3 - 1.2 mg/dL   GFR, Estimated 3 (L) >60 mL/min    Comment: (NOTE) Calculated using the CKD-EPI Creatinine Equation (2021)    Anion gap 15 5 - 15     Comment: Performed at [facility], [place] [address], [place], [place] [address]  CBC with Differential     Status: Abnormal   Collection Time: 04/22/21  8:47 PM  Result Value Ref Range   WBC 5.9 4.0 - 10.5 K/uL   RBC 3.48 (L) 3.87 - 5.11 MIL/uL   Hemoglobin 11.2 (L) 12.0 - 15.0 g/dL   HCT 34.5 (L) 36.0 - 46.0 %   MCV 99.1 80.0 - 100.0 fL   MCH 32.2 26.0 - 34.0 pg   MCHC 32.5 30.0 - 36.0 g/dL   RDW 15.3 11.5 - 15.5 %   Platelets 254 150 - 400 K/uL   nRBC 0.0 0.0 - 0.2 %   Neutrophils Relative % 78 %   Neutro Abs 4.7 1.7 - 7.7 K/uL   Lymphocytes Relative 11 %   Lymphs Abs 0.6 (L) 0.7 - 4.0 K/uL   Monocytes Relative 8 %   Monocytes Absolute 0.5 0.1 -  1.0 K/uL   Eosinophils Relative 1 %   Eosinophils Absolute 0.0 0.0 - 0.5 K/uL   Basophils Relative 1 %   Basophils Absolute 0.1 0.0 - 0.1 K/uL   Immature Granulocytes 1 %   Abs Immature Granulocytes 0.04 0.00 - 0.07 K/uL    Comment: Performed at [facility] [address] [place], [place] [address]  Culture, blood (routine x 2)     Status: None (Preliminary result)   Collection Time: 04/22/21  9:25 PM   Specimen: BLOOD LEFT HAND  Result Value Ref Range   Specimen Description BLOOD LEFT HAND    Special Requests      BOTTLES DRAWN AEROBIC ONLY Blood Culture adequate volume   Culture      NO GROWTH < 12 HOURS Performed at [facility], [place] [address] [place], [place] [address]    Report Status PENDING   Lactic acid, plasma     Status: None   Collection Time: 04/22/21 10:47 PM  Result Value Ref Range   Lactic Acid, Venous 1.3 0.5 - 1.9 mmol/L    Comment: Performed at [facility] [address], [place], [place] [address]  Resp Panel by RT-PCR (Flu A&B, Covid) Nasopharyngeal Swab     Status: None   Collection Time: 04/23/21 12:28 AM   Specimen: Nasopharyngeal Swab; Nasopharyngeal(NP) swabs in vial transport medium  Result Value Ref Range   SARS Coronavirus 2 by RT PCR NEGATIVE NEGATIVE    Comment:  (NOTE) SARS-CoV-2 target nucleic acids are NOT DETECTED.  The SARS-CoV-2 RNA is generally detectable in upper respiratory specimens during the acute phase of infection. The lowest concentration of SARS-CoV-2 viral copies this assay can detect is 138 copies/mL. A negative result does not preclude SARS-Cov-2 infection and should not be used as the sole basis for treatment or other patient management decisions. A negative result may occur with  improper specimen collection/handling, submission of specimen other than nasopharyngeal swab, presence of viral mutation(s) within the areas targeted by this assay, and inadequate number of viral copies(<138 copies/mL). A negative result must be combined with clinical observations, patient history, and epidemiological information. The expected result is Negative.  Fact Sheet for Patients:  EntrepreneurPulse.com.au  Fact Sheet for Healthcare Providers:  IncredibleEmployment.be  This test is no t yet approved or cleared by the [place] FDA and  has been authorized for detection and/or diagnosis of SARS-CoV-2 by FDA under an Emergency Use Authorization (EUA). This EUA will remain  in effect (meaning this test can be used) for the duration of the COVID-19 declaration under Section 564(b)(1) of the Act, 21 U.S.C.section 360bbb-3(b)(1), unless the authorization is terminated  or revoked sooner.       Influenza A by PCR NEGATIVE NEGATIVE   Influenza B by PCR NEGATIVE NEGATIVE    Comment: (NOTE) The Xpert Xpress SARS-CoV-2/FLU/RSV plus assay is intended as an aid in the diagnosis of influenza from Nasopharyngeal swab specimens and should not be used as a sole basis for treatment. Nasal washings and aspirates are unacceptable for Xpert Xpress SARS-CoV-2/FLU/RSV testing.  Fact Sheet for Patients: EntrepreneurPulse.com.au  Fact Sheet for Healthcare  Providers: IncredibleEmployment.be  This test is not yet approved or cleared by the [place] FDA and has been authorized for detection and/or diagnosis of SARS-CoV-2 by FDA under an Emergency Use Authorization (EUA). This EUA will remain in effect (meaning this test can be used) for the duration of the COVID-19 declaration under Section 564(b)(1) of the  Act, 21 U.S.C. section 360bbb-3(b)(1), unless the authorization is terminated or revoked.  Performed at [facility], [place] [address], [place], [place] [address]   Basic metabolic panel     Status: Abnormal   Collection Time: 04/23/21  4:41 AM  Result Value Ref Range   Sodium 135 135 - 145 mmol/L   Potassium 2.9 (L) 3.5 - 5.1 mmol/L   Chloride 99 98 - 111 mmol/L   CO2 21 (L) 22 - 32 mmol/L   Glucose, Bld 94 70 - 99 mg/dL    Comment: Glucose reference range applies only to samples taken after fasting for at least 8 hours.   BUN 27 (H) 8 - 23 mg/dL   Creatinine, Ser 12.42 (H) 0.44 - 1.00 mg/dL   Calcium 8.3 (L) 8.9 - 10.3 mg/dL   GFR, Estimated 3 (L) >60 mL/min    Comment: (NOTE) Calculated using the CKD-EPI Creatinine Equation (2021)    Anion gap 15 5 - 15    Comment: Performed at [facility] [address] [place], [place] [address]  Phosphorus     Status: None   Collection Time: 04/23/21  8:49 PM  Result Value Ref Range   Phosphorus 4.3 2.5 - 4.6 mg/dL    Comment: Performed at [facility] [address] [place], [place] [address]  Basic metabolic panel     Status: Abnormal   Collection Time: 04/23/21  8:49 PM  Result Value Ref Range   Sodium 128 (L) 135 - 145 mmol/L   Potassium 3.9 3.5 - 5.1 mmol/L   Chloride 99 98 - 111 mmol/L   CO2 19 (L) 22 - 32 mmol/L   Glucose, Bld 131 (H) 70 - 99 mg/dL    Comment: Glucose reference range applies only to samples taken after fasting for at least 8 hours.   BUN 28 (H) 8 - 23 mg/dL   Creatinine, Ser 11.71 (H) 0.44 - 1.00 mg/dL   Calcium  7.6 (L) 8.9 - 10.3 mg/dL   GFR, Estimated 3 (L) >60 mL/min    Comment: (NOTE) Calculated using the CKD-EPI Creatinine Equation (2021)    Anion gap 10 5 - 15    Comment: Performed at [facility] [address] [place], [place] [address]       Plan:   Hypotension likely secondary to nausea/vomiting.         RN reports patient does not have signs of hypervolemia on exam. Denies Dizziness. Mental status unchanged. Patient had a lactic acidosis 12/15 that resolved with fluid. Patient      also responsive to 1L fluid bolus yesterday for hypotension.  - Will give scheduled midodrine now - Ordered 500 mL bolus over 2 hours.

## 2021-04-24 NOTE — Progress Notes (Signed)
[facility] Progress Note  Subjective: seen in room, machine problems and only got 3 exchanges last night  Vitals:   04/24/21 0635 04/24/21 0824 04/24/21 1218 04/24/21 1659  BP: (!) 77/48 (!) 82/47 (!) 90/47 (!) 83/51  Pulse:  81 82 77  Resp: 13 14 14 16   Temp:  98.3 F (36.8 C) 97.8 F (36.6 C) 98.2 F (36.8 C)  TempSrc:  Oral Oral Oral  SpO2:  97% 98%   Weight:      Height:        Exam:  alert, nad   no jvd  Chest cta bilat  Cor reg no RG  Abd soft ntnd no ascites   Ext no LE edema   Alert, NF, ox3    PD cath L mid abd intact      Home meds include - asa, lipitor, midodrine 5 tid, protonix        OP PD: CCPD 7x per week, 43kg , 6 exchanges  , fill 2500cc , last fill 2000, no daytime exchange, dwell time 1.5h       Assessment/ Plan: Dysphagia/ N+V/ Diarrhea/ wt loss - FTT, unclear GI issue vs other. Some of this could be PD failure, but that would be a diagnosis of exclusion.  GI evaluating, appreciate assistance.  ESRD - on PD for about 5 years. Cont regimen here.  BP/volume - BP's remain low, will ^IVF's, cont midodrine.  Use lowest UF w/ all 1.5% fluids.  COPD Anemia ckd - Hb 11, no esa needed MBD ckd - Ca in range, add on [place] 04/24/2021, 5:31 PM   Recent Labs  Lab 04/22/21 2047 04/23/21 0441 04/23/21 2049 04/24/21 0651  K 2.8*   < > 3.9 3.3*  BUN 27*   < > 28* 24*  CREATININE 12.39*   < > 11.71* 10.64*  CALCIUM 8.1*   < > 7.6* 7.6*  PHOS  --   --  4.3  --   HGB 11.2*  --   --   --    < > = values in this interval not displayed.   Inpatient medications:  aspirin EC  81 mg Oral Daily   atorvastatin  20 mg Oral Daily   calcitRIOL  0.25 mcg Oral Daily   [START ON 04/25/2021] cosyntropin  0.25 mg Intravenous Once   feeding supplement  237 mL Oral BID BM   gentamicin cream  1 application Topical Daily   heparin  5,000 Units Subcutaneous Q8H   midodrine  5 mg Oral TID WC   pantoprazole  40 mg Oral Daily    potassium chloride  20 mEq Oral BID    sodium chloride 100 mL/hr at 04/24/21 0953   dialysis solution 1.5% low-MG/low-CA     ondansetron (ZOFRAN) IV, prochlorperazine

## 2021-04-25 ENCOUNTER — Inpatient Hospital Stay (HOSPITAL_COMMUNITY): Payer: Medicare HMO

## 2021-04-25 LAB — ACTH STIMULATION, 3 TIME POINTS
Cortisol, 30 Min: 27 ug/dL
Cortisol, 60 Min: 25 ug/dL
Cortisol, Base: 20.7 ug/dL

## 2021-04-25 LAB — CBC WITH DIFFERENTIAL/PLATELET
Abs Immature Granulocytes: 0.05 10*3/uL (ref 0.00–0.07)
Basophils Absolute: 0 10*3/uL (ref 0.0–0.1)
Basophils Relative: 1 %
Eosinophils Absolute: 0.1 10*3/uL (ref 0.0–0.5)
Eosinophils Relative: 2 %
HCT: 34 % — ABNORMAL LOW (ref 36.0–46.0)
Hemoglobin: 10.9 g/dL — ABNORMAL LOW (ref 12.0–15.0)
Immature Granulocytes: 1 %
Lymphocytes Relative: 8 %
Lymphs Abs: 0.6 10*3/uL — ABNORMAL LOW (ref 0.7–4.0)
MCH: 31.6 pg (ref 26.0–34.0)
MCHC: 32.1 g/dL (ref 30.0–36.0)
MCV: 98.6 fL (ref 80.0–100.0)
Monocytes Absolute: 0.4 10*3/uL (ref 0.1–1.0)
Monocytes Relative: 5 %
Neutro Abs: 6.1 10*3/uL (ref 1.7–7.7)
Neutrophils Relative %: 83 %
Platelets: 249 10*3/uL (ref 150–400)
RBC: 3.45 MIL/uL — ABNORMAL LOW (ref 3.87–5.11)
RDW: 15.8 % — ABNORMAL HIGH (ref 11.5–15.5)
WBC: 7.3 10*3/uL (ref 4.0–10.5)
nRBC: 0 % (ref 0.0–0.2)

## 2021-04-25 LAB — BASIC METABOLIC PANEL
Anion gap: 11 (ref 5–15)
BUN: 16 mg/dL (ref 8–23)
CO2: 21 mmol/L — ABNORMAL LOW (ref 22–32)
Calcium: 7.9 mg/dL — ABNORMAL LOW (ref 8.9–10.3)
Chloride: 103 mmol/L (ref 98–111)
Creatinine, Ser: 8.5 mg/dL — ABNORMAL HIGH (ref 0.44–1.00)
GFR, Estimated: 4 mL/min — ABNORMAL LOW (ref >60)
Glucose, Bld: 132 mg/dL — ABNORMAL HIGH (ref 70–99)
Potassium: 2.8 mmol/L — ABNORMAL LOW (ref 3.5–5.1)
Sodium: 135 mmol/L (ref 135–145)

## 2021-04-25 LAB — TSH: TSH: 2.501 u[IU]/mL (ref 0.350–4.500)

## 2021-04-25 MED ORDER — IOHEXOL 300 MG/ML  SOLN
100.0000 mL | Freq: Once | INTRAMUSCULAR | Status: AC | PRN
  Administered 2021-04-25: 16:00:00 100 mL via INTRAVENOUS

## 2021-04-25 MED ORDER — IOHEXOL 9 MG/ML PO SOLN
ORAL | Status: AC
  Administered 2021-04-25: 14:00:00 500 mL
  Filled 2021-04-25: qty 1000

## 2021-04-25 MED ORDER — DELFLEX-LC/1.5% DEXTROSE 344 MOSM/L IP SOLN: INTRAPERITONEAL | Status: DC

## 2021-04-25 MED ORDER — RENA-VITE PO TABS
1.0000 | ORAL_TABLET | Freq: Every day | ORAL | Status: DC
  Administered 2021-04-25 – 2021-04-26 (×2): 1 via ORAL
  Filled 2021-04-25 (×2): qty 1

## 2021-04-25 MED ORDER — LIP MEDEX EX OINT
TOPICAL_OINTMENT | CUTANEOUS | Status: DC | PRN
  Administered 2021-04-25: 1 via TOPICAL
  Filled 2021-04-25: qty 7

## 2021-04-25 MED ORDER — FLUDROCORTISONE ACETATE 0.1 MG PO TABS
0.1000 mg | ORAL_TABLET | Freq: Every day | ORAL | Status: DC
  Administered 2021-04-25 – 2021-04-29 (×5): 0.1 mg via ORAL
  Filled 2021-04-25 (×5): qty 1

## 2021-04-25 MED ORDER — MIDODRINE HCL 5 MG PO TABS
15.0000 mg | ORAL_TABLET | Freq: Three times a day (TID) | ORAL | Status: DC
  Administered 2021-04-25 – 2021-04-29 (×13): 15 mg via ORAL
  Filled 2021-04-25 (×12): qty 3

## 2021-04-25 MED ORDER — POTASSIUM CHLORIDE 20 MEQ PO PACK: 40.0000 meq | PACK | Freq: Two times a day (BID) | ORAL | Status: DC

## 2021-04-25 MED ORDER — POTASSIUM CHLORIDE 20 MEQ PO PACK
40.0000 meq | PACK | Freq: Two times a day (BID) | ORAL | Status: DC
  Filled 2021-04-25 (×2): qty 2

## 2021-04-25 NOTE — Progress Notes (Signed)
[facility] Progress Note  Subjective: seen in room, machine problems and only got 3 exchanges last night  Vitals:   04/25/21 0431 04/25/21 0827 04/25/21 1031 04/25/21 1213  BP: 99/60 (!) 90/56 (!) 91/51 94/60  Pulse: 76  81 90  Resp: 19 (!) 22 14 17   Temp:  98.3 F (36.8 C) 98.1 F (36.7 C) 97.8 F (36.6 C)  TempSrc:  Oral Oral Oral  SpO2:  95%    Weight:      Height:        Exam:  alert, nad   no jvd  Chest cta bilat  Cor reg no RG  Abd soft ntnd no ascites   Ext no LE edema   Alert, NF, ox3    PD cath L mid abd intact      Home meds include - asa, lipitor, midodrine 5 tid, protonix        OP PD: CCPD 7x per week, 43kg , 6 exchanges  , fill 2500cc , last fill 2000, no daytime exchange, dwell time 1.5h       Assessment/ Plan: Dysphagia/ N+V/ Diarrhea/ wt loss - FTT, unclear GI issue vs other. Her symptoms could be due to PD failure (nausea, fatigue, wt loss), but that would be a diagnosis of exclusion. GI evaluating, appreciate assistance. If general w/u does not explain her wt loss and other symptoms, will plan trial of HD.  Hypotension - acth stim test and TSH normal this am. Suspect mostly volume depletion. Will cont IVF"s at lower dose 75 cc/hr and ^midodrine to 15mg  tid.  ESRD - on PD for about 5 years. Cont regimen here for now.  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    Inpatient medications:  aspirin EC  81 mg Oral Daily   atorvastatin  20 mg Oral Daily   calcitRIOL  0.25 mcg Oral Daily   feeding supplement  237 mL Oral BID BM   fludrocortisone  0.1 mg Oral Daily   gentamicin cream  1 application Topical Daily   heparin  5,000 Units Subcutaneous Q8H   midodrine  5 mg Oral TID WC   multivitamin  1 tablet Oral QHS   pantoprazole  40 mg Oral Daily   potassium chloride  20 mEq Oral BID    sodium chloride 150 mL/hr at 04/25/21 1019   dialysis solution 1.5% low-MG/low-CA     lip balm, ondansetron (ZOFRAN) IV, prochlorperazine

## 2021-04-25 NOTE — Progress Notes (Addendum)
° °         Daily Rounding Note  04/25/2021, 10:34 AM  LOS: 1 day   SUBJECTIVE:   Chief complaint:  Dysphagia  Rads will do  esophagram tmrw, unless urgent esophagrams are limited on weekends.   Wouold be ok to get SLP eval since pt now on clears.   EGD cxld on  12/17 due to hypotension.  This persists with readings in low 90s/mid 50s.  No tachycardia.  No hypoxia.  No fever.  No abdominal pain.  No bowel movements. Would like to try some of the available diet selections on a full liquid diet.   OBJECTIVE:         Vital signs in last 24 hours:    Temp:  [97.1 F (36.2 C)-98.3 F (36.8 C)] 98.1 F (36.7 C) (12/18 1031) Pulse Rate:  [73-82] 81 (12/18 1031) Resp:  [14-22] 14 (12/18 1031) BP: (82-105)/(47-74) 91/51 (12/18 1031) SpO2:  [95 %-99 %] 95 % (12/18 0827) Weight:  [46.3 kg] 46.3 kg (12/17 1855) Last BM Date: 04/23/21 [facility] Weights   04/22/21 2028 04/24/21 1855  Weight: 43 kg 46.3 kg   General: Frail, comfortable, alert. Heart: RRR. Chest: Clear bilaterally.  No labored breathing or cough. Abdomen: Soft.  Not tender.  Active bowel sounds.  No distention.  ED catheter site benign. Extremities: Thin arms and legs.  No CCE. Neuro/Psych: Pleasant, calm, alert, cooperative.  No confusion.  Intake/Output from previous day: 12/17 0701 - 12/18 0700 In: 778.2 [P.O.:120; IV Piggyback:658.2] Out: -   Intake/Output this shift: Total I/O In: 12502 [Other:12502] Out: 13106 [Other:13106]  Lab Results: Recent Labs    04/22/21 2047 04/25/21 0512  WBC 5.9 7.3  HGB 11.2* 10.9*  HCT 34.5* 34.0*  PLT 254 249   BMET Recent Labs    04/23/21 2049 04/24/21 0651 04/25/21 0512  NA 128* 134* 135  K 3.9 3.3* 2.8*  CL 99 101 103  CO2 19* 23 21*  GLUCOSE 131* 80 132*  BUN 28* 24* 16  CREATININE 11.71* 10.64* 8.50*  CALCIUM 7.6* 7.6* 7.9*   LFT Recent Labs    04/22/21 2047  PROT 5.6*  ALBUMIN 2.9*  AST 31  ALT 19   ALKPHOS 66  BILITOT 0.9   PT/INR No results for input(s): LABPROT, INR in the last 72 hours. Hepatitis Panel No results for input(s): HEPBSAG, HCVAB, HEPAIGM, HEPBIGM in the last 72 hours.  Studies/Results: No results found.  ASSESMENT:     Dysphagia.     Hypotension, persistent.  ESRD, continues on home regimen of peritoneal dialysis.   PLAN   Plan barium esophagram tomorrow.  Okay to proceed with SLP evaluation today. Advancing to full liquid diet, n.p.o. after midnight for esophagram    [name]  04/25/2021, 10:34 AM Phone [phone]

## 2021-04-25 NOTE — Progress Notes (Signed)
Spoke with unit phlebotomist in regards to needing 30 min cortisol level drawn. Phlebotomist en route to patient's room.

## 2021-04-25 NOTE — Progress Notes (Signed)
Initial Nutrition Assessment  DOCUMENTATION CODES:   Not applicable  INTERVENTION:   Add Renal MVI daily  Recommend liberalized diet once advanced  Continue Ensure Enlive po BID, each supplement provides 350 kcal and 20 grams of protein-but pt with hx of indicating she does not like any ONS  Add Snacks TID once diet advanced   NUTRITION DIAGNOSIS:   Inadequate oral intake related to dysphagia, acute illness, altered GI function as evidenced by per patient/family report.  GOAL:   Patient will meet greater than or equal to 90% of their needs   MONITOR:   Diet advancement, PO intake, Labs, Weight trends  REASON FOR ASSESSMENT:   Malnutrition Screening Tool    ASSESSMENT:   75 yo female admitted with FTT with dysphagia, diarrhea, N/V and weight loss. PMH includes ESRD/PD, COPD, HTN, GERD  EGD cancelled yesterday secondary to hypotension. Barium study with tablet pending. Per GI pt has appetite but having solid food dysphagia with most meals. Pt tolerating some CL diet Unable to reach pt via phone for nutrition hx at this time  Pt currently with order for Ensure but in the past pt indicates she does not like ONS, prefers snacks between meals. Given pt is on CL diet, continue ONS for now. Add snacks once diet advanced  CCPD prescription: 1.5% Dextrose, 6 exchanges in 24 hours, 2L/exchange. Estimated calories absorbed: 240-312kcals/24 hours  EDW 43 kg Pt reports 25 pound wt loss since September. Current wt 46 kg  Labs: potassium 2.8 (L), phosphorus 4.3 (wdl) Meds: calcitriol, KCl, midodrine, zofran, compazine  NUTRITION - FOCUSED PHYSICAL EXAM:  Unable to assess  Diet Order:   Diet Order             Diet clear liquid Room service appropriate? Yes; Fluid consistency: Thin  Diet effective now                   EDUCATION NEEDS:   Not appropriate for education at this time  Skin:  Skin Assessment: Reviewed RN Assessment  Last BM:  12/16  Height:    Ht Readings from Last 1 Encounters:  04/22/21 5' (1.524 m)    Weight:   Wt Readings from Last 1 Encounters:  04/24/21 46.3 kg     BMI:  Body mass index is 19.93 kg/m.  Estimated Nutritional Needs:   Kcal:  1550-1750 kcals  Protein:  80-90 g  Fluid:  >/= 1.5 L    Kerman Passey MS, RDN, LDN, CNSC Registered Dietitian III Clinical Nutrition RD Pager and On-Call Pager Number Located in [place]

## 2021-04-25 NOTE — Progress Notes (Signed)
Coordinated timing of Cortropsyn medication administration with the lab. Cortisol level drawn prior to lab by vascular access RN with IV insertion. Lab notified that next draw time is 0600.

## 2021-04-25 NOTE — Progress Notes (Signed)
PROGRESS NOTE  [name] KDT:[id number] DOB: [birth year] DOA: 04/22/2021 PCP: [facility]  Brief History   [name] is a 75 y.o. female with medical history significant for ESRD on PD, hypertension, COPD, GERD who presents with multiple concerns.   She reports that since September she has been dealing with some sinus issues that she feels is still persistent.  Since that time she has been having trouble with solids feeling stuck with swallowing.  She is able to take liquids but would have nausea and vomiting immediately afterwards.  Also having persistent diarrhea.  Denies any abdominal pain or issues with her peritoneal dialysis.  No fever. She reports about a 25 pound weight loss since these symptoms started.  Currently she is feeling so weak she is having trouble doing bathing or even holding a spoon to eat.  Her blood pressure also drops with ambulation.   She has presented to ER numerous time for these issues and told she is dehydrated and has hypokalemia.  She has planned follow-up with GI outpatient but appointment is not until next week.  She was recently placed on midodrine TID PRN but has only been able to take it at most 1 time a day due to her nausea and vomiting. Her peritoneal dialysis recently has also been increased from 4 to 5 hours.   In the ED she was afebrile, borderline hypotensive with BP of 95/60. She has no leukocytosis or anemia.  However lactate was elevated 2.5 but improved down to 1.3 following IV fluid. Sodium of 133, K of 2.8, chloride of 98, CO2 20, creatinine of 12.39 which is near baseline.  BG of 125.   Chest x-ray is negative.   She was given 500 cc of normal saline bolus and 10 mEq of IV potassium.  Hospitalist then called for admission for hypotension and failure to thrive.  The patient has remained hypotensive despite midodrine and IV fluid supplementation. Cosyntropin stim test demonstrates adrenal insufficiency. She was started on  fludrocortisone.   GI has not been able to take the patient for endoscopy due to hypotension. CT chest abdomen and pelvis has been ordered to investigate for possible malignancy. GI has ordered esophagram.  Consultants  SLP  Procedures  None  Antibiotics   Anti-infectives (From admission, onward)    None      Subjective  The patient is resting comfortably. She is nauseated.  Objective   Vitals:  Vitals:   04/25/21 1031 04/25/21 1213  BP: (!) 91/51 94/60  Pulse: 81 90  Resp: 14 17  Temp: 98.1 F (36.7 C) 97.8 F (36.6 C)  SpO2:      Exam:  Constitutional:  The patient is awake, alert, and oriented x 3. No acute distress. Respiratory:  No increased work of breathing. No wheezes, rales, or rhonchi No tactile fremitus Cardiovascular:  Regular rate and rhythm No murmurs, ectopy, or gallups. No lateral PMI. No thrills. Abdomen:  Abdomen is soft, non-tender, non-distended No hernias, masses, or organomegaly Normoactive bowel sounds.  Musculoskeletal:  No cyanosis, clubbing, or edema Skin:  No rashes, lesions, ulcers palpation of skin: no induration or nodules Neurologic:  CN 2-12 intact Sensation all 4 extremities intact Psychiatric:  Mental status Mood, affect appropriate Orientation to person, place, time  judgment and insight appear intact  I have personally reviewed the following:   Today's Data  Vitals  Lab Data  BMP  Micro Data  Blood culture - no growth  Imaging  CXR -  negative  Cardiology Data  EKG  Scheduled Meds:  aspirin EC  81 mg Oral Daily   atorvastatin  20 mg Oral Daily   calcitRIOL  0.25 mcg Oral Daily   feeding supplement  237 mL Oral BID BM   fludrocortisone  0.1 mg Oral Daily   gentamicin cream  1 application Topical Daily   heparin  5,000 Units Subcutaneous Q8H   midodrine  15 mg Oral TID WC   multivitamin  1 tablet Oral QHS   pantoprazole  40 mg Oral Daily   potassium chloride  40 mEq Oral BID   Principal  Problem:   Hypotension Active Problems:   ESRD (end stage renal disease) on dialysis (HCC)   Failure to thrive in adult   Dysphagia   LOS: 1 day   A & P  Assessment/Plan   Failure to thrive Dysphagia/presistent N/V and diarrhea  -Patient has had dysphagia for several months with GI outpatient follow-up next week.  She has had significant 25 pound weight loss due to this issue.  Also has been having nausea vomiting and diarrhea.  Cannot rule out potential malignancy but will need endoscopy.  However, currently having persistent hypotension that would preclude her from getting endoscopy during this admission -will work on improving her BP and have her continue to follow up outpatient with GI , - SLP has been consulted for initial evaluation of dyphagia. -GI was consulted and plan was for endoscopy this morning. However, the patient has been hypotensive, and for that reason endoscopy has been delayed.    Hypotension Pt is on midodrine 15 mg tid. Florinef added. Monitor.   Hypokalemia Potassium 2.8 this morning. Supplement.   ESRD on PD Last session on 12/14 need nephrology consult in the morning to assist with PD and also appreciate recs for persistent hypotension since she recently had sessions increased from 4 to 5 hours. Nephrology has been consulted.  I have seen and examined this patient myself. I have spent 34 minutes in her evaluation and care.   DVT prophylaxis:.Subcu heparin Code Status: Full Family Communication: none available disposition Plan: Home with observation  [name] San, DO [facility] Direct contact: see [email or web address]  7PM-7AM contact night coverage as above 04/25/2021, 4:01 PM  LOS: 0 days

## 2021-04-25 NOTE — Progress Notes (Signed)
SLP Cancellation Note  Patient Details Name: [name] MRN: [id number] DOB: 1945-12-30   Cancelled treatment:       Reason Eval/Treat Not Completed: Other (comment) (A clear liquid diet is active, but pt's case discussed with RN who advised that pt is currently NPO pending GI's plan for possible EGD, and trays have been held.)  [place]. Hardin Negus, [place], [place] Office number [phone] Pager [phone]  Horton Marshall 04/25/2021, 10:24 AM

## 2021-04-26 ENCOUNTER — Inpatient Hospital Stay (HOSPITAL_COMMUNITY): Payer: Medicare HMO

## 2021-04-26 DIAGNOSIS — R933 Abnormal findings on diagnostic imaging of other parts of digestive tract: Secondary | ICD-10-CM

## 2021-04-26 DIAGNOSIS — K225 Diverticulum of esophagus, acquired: Secondary | ICD-10-CM

## 2021-04-26 LAB — CBC WITH DIFFERENTIAL/PLATELET
Abs Immature Granulocytes: 0.07 10*3/uL (ref 0.00–0.07)
Basophils Absolute: 0 10*3/uL (ref 0.0–0.1)
Basophils Relative: 0 %
Eosinophils Absolute: 0.1 10*3/uL (ref 0.0–0.5)
Eosinophils Relative: 1 %
HCT: 31.8 % — ABNORMAL LOW (ref 36.0–46.0)
Hemoglobin: 10.5 g/dL — ABNORMAL LOW (ref 12.0–15.0)
Immature Granulocytes: 1 %
Lymphocytes Relative: 10 %
Lymphs Abs: 0.9 10*3/uL (ref 0.7–4.0)
MCH: 31.9 pg (ref 26.0–34.0)
MCHC: 33 g/dL (ref 30.0–36.0)
MCV: 96.7 fL (ref 80.0–100.0)
Monocytes Absolute: 0.4 10*3/uL (ref 0.1–1.0)
Monocytes Relative: 5 %
Neutro Abs: 7.7 10*3/uL (ref 1.7–7.7)
Neutrophils Relative %: 83 %
Platelets: 268 10*3/uL (ref 150–400)
RBC: 3.29 MIL/uL — ABNORMAL LOW (ref 3.87–5.11)
RDW: 15.9 % — ABNORMAL HIGH (ref 11.5–15.5)
WBC: 9.2 10*3/uL (ref 4.0–10.5)
nRBC: 0 % (ref 0.0–0.2)

## 2021-04-26 LAB — BASIC METABOLIC PANEL
Anion gap: 12 (ref 5–15)
BUN: 16 mg/dL (ref 8–23)
CO2: 16 mmol/L — ABNORMAL LOW (ref 22–32)
Calcium: 7.6 mg/dL — ABNORMAL LOW (ref 8.9–10.3)
Chloride: 104 mmol/L (ref 98–111)
Creatinine, Ser: 7.82 mg/dL — ABNORMAL HIGH (ref 0.44–1.00)
GFR, Estimated: 5 mL/min — ABNORMAL LOW (ref >60)
Glucose, Bld: 114 mg/dL — ABNORMAL HIGH (ref 70–99)
Potassium: 2.9 mmol/L — ABNORMAL LOW (ref 3.5–5.1)
Sodium: 132 mmol/L — ABNORMAL LOW (ref 135–145)

## 2021-04-26 NOTE — Progress Notes (Signed)
°   04/25/21 2100  Assess: MEWS Score  Temp 97.8 F (36.6 C)  BP (!) 97/52  ECG Heart Rate 71  Resp 12  Level of Consciousness Alert  Assess: MEWS Score  MEWS Temp 0  MEWS Systolic 1  MEWS Pulse 0  MEWS RR 1  MEWS LOC 0  MEWS Score 2  MEWS Score Color Yellow  Assess: if the MEWS score is Yellow or Red  Were vital signs taken at a resting state? Yes  Focused Assessment No change from prior assessment  Early Detection of Sepsis Score *See Row Information* Low  MEWS guidelines implemented *See Row Information* Yes  Treat  MEWS Interventions Escalated (See documentation below)  Pain Scale 0-10  Pain Score 0  Take Vital Signs  Increase Vital Sign Frequency  Yellow: Q 2hr X 2 then Q 4hr X 2, if remains yellow, continue Q 4hrs  Escalate  MEWS: Escalate Yellow: discuss with charge nurse/RN and consider discussing with provider and RRT  Notify: Charge Nurse/RN  Name of Charge Nurse/RN Notified Nikki Jose Alleyne  Date Charge Nurse/RN Notified 04/25/21  Time Charge Nurse/RN Notified 2100  Document  Patient Outcome Stabilized after interventions  Progress note created (see row info) Yes

## 2021-04-26 NOTE — Progress Notes (Signed)
PROGRESS NOTE  [name] HCW:[id number] DOB: 10/01/1945 DOA: 04/22/2021 [facility]CP: [facility]  Brief History   [name] is a 75 y.o. female with medical history significant for ESRD on PD, hypertension, COPD, GERD who presents with multiple concerns.   She reports that since September she has been dealing with some sinus issues that she feels is still persistent.  Since that time she has been having trouble with solids feeling stuck with swallowing.  She is able to take liquids but would have nausea and vomiting immediately afterwards.  Also having persistent diarrhea.  Denies any abdominal pain or issues with her peritoneal dialysis.  No fever. She reports about a 25 pound weight loss since these symptoms started.  Currently she is feeling so weak she is having trouble doing bathing or even holding a spoon to eat.  Her blood pressure also drops with ambulation.   She has presented to ER numerous time for these issues and told she is dehydrated and has hypokalemia.  She has planned follow-up with GI outpatient but appointment is not until next week.  She was recently placed on midodrine TID PRN but has only been able to take it at most 1 time a day due to her nausea and vomiting. Her peritoneal dialysis recently has also been increased from 4 to 5 hours.   In the ED she was afebrile, borderline hypotensive with BP of 95/60. She has no leukocytosis or anemia.  However lactate was elevated 2.5 but improved down to 1.3 following IV fluid. Sodium of 133, K of 2.8, chloride of 98, CO2 20, creatinine of 12.39 which is near baseline.  BG of 125.   Chest x-ray is negative.   She was given 500 cc of normal saline bolus and 10 mEq of IV potassium.  Hospitalist then called for admission for hypotension and failure to thrive.  The patient has remained hypotensive despite midodrine and IV fluid supplementation. Cosyntropin stim test demonstrates adrenal insufficiency. She was started on  fludrocortisone.   GI has not been able to take the patient for endoscopy due to hypotension. CT chest abdomen and pelvis has been ordered to investigate for possible malignancy. GI has ordered esophagram.  CT demonstrated persistent RUL nodule that is being followed as outpatient. Esophagram demonstrated esophageal dysmotility and a diverticulum. Plan is for EGD tomorrow.  Consultants  SLP  Procedures  None  Antibiotics   Anti-infectives (From admission, onward)    None      Subjective  The patient is resting comfortably. No new complaints.  Objective   Vitals:  Vitals:   04/26/21 1142 04/26/21 1559  BP: (!) 89/49 (!) 103/57  Pulse: 71 70  Resp: 16 16  Temp: 98.3 F (36.8 C) 98.2 F (36.8 C)  SpO2: 100%     Exam:  Constitutional:  The patient is awake, alert, and oriented x 3. No acute distress. Respiratory:  No increased work of breathing. No wheezes, rales, or rhonchi No tactile fremitus Cardiovascular:  Regular rate and rhythm No murmurs, ectopy, or gallups. No lateral PMI. No thrills. Abdomen:  Abdomen is soft, non-tender, non-distended No hernias, masses, or organomegaly Normoactive bowel sounds.  Musculoskeletal:  No cyanosis, clubbing, or edema Skin:  No rashes, lesions, ulcers palpation of skin: no induration or nodules Neurologic:  CN 2-12 intact Sensation all 4 extremities intact Psychiatric:  Mental status Mood, affect appropriate Orientation to person, place, time  judgment and insight appear intact  I have personally reviewed the following:  Today's Data  Vitals  Lab Data  BMP CBC  Micro Data  Blood culture - no growth  Imaging  CXR - negative  Cardiology Data  EKG  Scheduled Meds:  aspirin EC  81 mg Oral Daily   atorvastatin  20 mg Oral Daily   calcitRIOL  0.25 mcg Oral Daily   feeding supplement  237 mL Oral BID BM   fludrocortisone  0.1 mg Oral Daily   gentamicin cream  1 application Topical Daily   heparin   5,000 Units Subcutaneous Q8H   midodrine  15 mg Oral TID WC   multivitamin  1 tablet Oral QHS   pantoprazole  40 mg Oral Daily   potassium chloride  40 mEq Oral BID   Principal Problem:   Hypotension Active Problems:   ESRD (end stage renal disease) on dialysis (HCC)   Failure to thrive in adult   Dysphagia   LOS: 2 days   A & P  Assessment/Plan   Failure to thrive Dysphagia/presistent N/V and diarrhea  -Patient has had dysphagia for several months with GI outpatient follow-up next week.  She has had significant 25 pound weight loss due to this issue.  Also has been having nausea vomiting and diarrhea.  Cannot rule out potential malignancy but will need endoscopy.  However, currently having persistent hypotension that would preclude her from getting endoscopy during this admission -will work on improving her BP and have her continue to follow up outpatient with GI , - SLP has been consulted for initial evaluation of dyphagia. - CT demonstrated persistent RUL nodule that is being followed as outpatient.  - Esophagram demonstrated esophageal dysmotility and a diverticulum.  - Plan is for EGD tomorrow.  Hypotension Pt is on midodrine 15 mg tid. Florinef added. Blood pressure is improved.   Hypokalemia Potassium 2.9 this morning. Supplement. Continue to monitor.   ESRD on PD  Nephrology has been consulted. PD is being managed by them as inpatient. Should GI investigate not reveal a cause for the patient's weight loss and other symptoms, they will give a trial of HD.  I have seen and examined this patient myself. I have spent 36 minutes in her evaluation and care.   DVT prophylaxis:.Subcu heparin Code Status: Full Family Communication: none available disposition Plan: Home with observation  [name] Felmlee, DO [facility] Direct contact: see [email or web address]  7PM-7AM contact night coverage as above 04/26/2021, 5:41 PM  LOS: 0 days

## 2021-04-26 NOTE — Progress Notes (Addendum)
[facility] Progress Note   Subjective:    Patient seen and examined at bedside. Returned from swallow evaluation. Reports occasional nausea. Denies SOB, CP, and vomiting. Tolerating PD overnight and plan for PD tonight.  Objective Vitals:   04/26/21 0000 04/26/21 0400 04/26/21 0735 04/26/21 1142  BP: 101/61 117/62 (!) 107/59 (!) 89/49  Pulse: 60 65 61 71  Resp: 13 14 17 16   Temp: 97.6 F (36.4 C) 98.6 F (37 C) 97.7 F (36.5 C) 98.3 F (36.8 C)  TempSrc: Oral Oral Oral Oral  SpO2: 100% 96% 98% 100%  Weight:  53.1 kg 47.4 kg   Height:       Physical Exam General: Older woman; Alert; NAD Heart: Normal S1 and S2; No murmurs, gallops, or rubs Lungs: Clear throughout; No wheezing, rales, or rhonchi Abdomen: Soft and non-tender; active bowel sounds Extremities: No edema BLLE Dialysis Access: L PD catheter   Filed Weights   04/24/21 1855 04/26/21 0400 04/26/21 0735  Weight: 46.3 kg 53.1 kg 47.4 kg    Intake/Output Summary (Last 24 hours) at 04/26/2021 1204 Last data filed at 04/26/2021 0735 Gross per 24 hour  Intake 12495 ml  Output 527 ml  Net 11968 ml    Additional Objective Labs: Basic Metabolic Panel: Recent Labs  Lab 04/23/21 2049 04/24/21 0651 04/25/21 0512 04/26/21 0237  NA 128* 134* 135 132*  K 3.9 3.3* 2.8* 2.9*  CL 99 101 103 104  CO2 19* 23 21* 16*  GLUCOSE 131* 80 132* 114*  BUN 28* 24* 16 16  CREATININE 11.71* 10.64* 8.50* 7.82*  CALCIUM 7.6* 7.6* 7.9* 7.6*  PHOS 4.3  --   --   --    Liver Function Tests: Recent Labs  Lab 04/22/21 2047  AST 31  ALT 19  ALKPHOS 66  BILITOT 0.9  PROT 5.6*  ALBUMIN 2.9*   No results for input(s): LIPASE, AMYLASE in the last 168 hours. CBC: Recent Labs  Lab 04/22/21 2047 04/25/21 0512 04/26/21 0237  WBC 5.9 7.3 9.2  NEUTROABS 4.7 6.1 7.7  HGB 11.2* 10.9* 10.5*  HCT 34.5* 34.0* 31.8*  MCV 99.1 98.6 96.7  PLT 254 249 268   Blood Culture    Component Value Date/Time   SDES  BLOOD LEFT HAND 04/22/2021 2125   SPECREQUEST  04/22/2021 2125    BOTTLES DRAWN AEROBIC ONLY Blood Culture adequate volume   CULT  04/22/2021 2125    NO GROWTH 4 DAYS Performed at [facility], [place] [address], [place], [place] [address]    REPTSTATUS PENDING 04/22/2021 2125    Cardiac Enzymes: No results for input(s): CKTOTAL, CKMB, CKMBINDEX, TROPONINI in the last 168 hours. CBG: No results for input(s): GLUCAP in the last 168 hours. Iron Studies: No results for input(s): IRON, TIBC, TRANSFERRIN, FERRITIN in the last 72 hours. Lab Results  Component Value Date   INR 1.36 07/20/2016   INR 0.96 03/09/2009   INR 1.0 11/04/2008   Studies/Results: CT CHEST ABDOMEN PELVIS W CONTRAST  Result Date: 04/25/2021 CLINICAL DATA:  Occult malignancy. EXAM: CT CHEST, ABDOMEN, AND PELVIS WITH CONTRAST TECHNIQUE: Multidetector CT imaging of the chest, abdomen and pelvis was performed following the standard protocol during bolus administration of intravenous contrast. CONTRAST:  172mL OMNIPAQUE IOHEXOL 300 MG/ML  SOLN COMPARISON:  Chest CT 07/01/2020. CT abdomen and pelvis 06/07/2018. CT chest 07/29/2016. FINDINGS: CT CHEST FINDINGS Cardiovascular: No significant vascular findings. Normal heart size. No pericardial effusion. The main pulmonary artery is enlarged compatible  with pulmonary artery hypertension. Mediastinum/Nodes: There are bilateral thyroid nodules measuring up to 7 mm. There are no enlarged mediastinal or hilar lymph nodes identified. There is a small hiatal hernia. The esophagus is nondilated. Lungs/Pleura: There are new trace bilateral pleural effusions there is no pneumothorax. There is stable mild elevation left hemidiaphragm. Mild emphysematous changes are again noted. Partial lobectomy changes are seen in the left lung. There are stable scattered areas of bronchiectasis, scarring, and parenchymal opacities. Focal slightly nodular opacity in the right upper lobe measuring 1.8 by 1.1  cm image 5/47 has not significantly changed. There are no new suspicious pulmonary nodules or lung infiltrates. The trachea and central airways are patent. Musculoskeletal: No chest wall mass or suspicious bone lesions identified. Cervical spinal fusion plate is present. CT ABDOMEN PELVIS FINDINGS Hepatobiliary: No focal liver abnormality is seen. No gallstones, gallbladder wall thickening, or biliary dilatation. Pancreas: There is diffuse pancreatic ductal prominence measuring 2 mm similar to the prior examination. No focal pancreatic lesions are seen. No peripancreatic inflammation. Spleen: Small in size, unchanged. Adrenals/Urinary Tract: The kidneys are atrophic bilaterally. There is a hypodensity in the superior pole the left kidney which is too small to characterize, likely a cyst. There is no hydronephrosis. There is no excretion of contrast from the kidneys. Adrenal glands and bladder are within normal limits. Stomach/Bowel: Small hiatal hernia is unchanged. Stomach is otherwise within normal limits. No evidence of bowel wall thickening, distention, or inflammatory changes. There is sigmoid colon diverticulosis without evidence for acute diverticulitis. The appendix is not seen. Vascular/Lymphatic: Aortic atherosclerosis. No enlarged abdominal or pelvic lymph nodes. Reproductive: Status post hysterectomy. No adnexal masses. Other: Peritoneal dialysis catheter is seen in the abdomen ending in the pelvis. There is a small amount of free fluid in the pelvis. There is a small amount of free air in the upper abdomen likely related to peritoneal dialysis. There is diffuse body wall edema. Musculoskeletal: No acute or significant osseous findings. IMPRESSION: 1. New small bilateral pleural effusions. 2. Stable postsurgical changes, scarring, and bronchiectasis within the lungs. Scattered parenchymal opacities are unchanged including a slightly spiculated nodular density in the right upper lobe measuring 1.8 x 1.1  cm. No new suspicious pulmonary nodules or pulmonary infiltrates. 3. Subcentimeter incidental thyroid nodule. No follow-up imaging is recommended. Reference: J Am Coll Radiol. 2015 Feb;12(2): 143-50 4. Small amount of free air and free fluid in the abdomen are likely related to peritoneal dialysis. 5. Mild body wall edema. 6. No other acute localizing process in the abdomen or pelvis. 7. Sigmoid colon diverticulosis. 8. Aortic Atherosclerosis (ICD10-I70.0) and Emphysema (ICD10-J43.9). Electronically Signed   By: Ronney Asters M.D.   On: 04/25/2021 17:08   DG ESOPHAGUS W SINGLE CM (SOL OR THIN BA)  Result Date: 04/26/2021 CLINICAL DATA:  Difficulty swallowing. Evaluate epiphrenic diverticulum. EXAM: ESOPHAGUS/BARIUM SWALLOW/TABLET STUDY TECHNIQUE: Single contrast examination was performed using thin liquid barium. This exam was performed by Rushie Nyhan, and was supervised and interpreted by Abigail Miyamoto, M.D. FLUOROSCOPY TIME:  Radiation Exposure Index (as provided by the fluoroscopic device): Not applicable. If the device does not provide the exposure index: Fluoroscopy Time:  2 minutes and 30 seconds Number of Acquired Images:  None COMPARISON:  Chest CT 04/25/2021 FINDINGS: Focused, single-contrast exam performed with the patient in a supine, minimally oblique position. Evaluation of esophageal peristalsis demonstrates tertiary contractions with proximal escape waves and contrast stasis in the upper esophagus. Full column evaluation of the esophagus demonstrates no persistent narrowing or  stricture. An epiphrenic diverticulum is identified at 3.5 x 3.1 cm, eccentric right. IMPRESSION: 1. Esophageal dysmotility.  No other explanation for dysphagia. 2. 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 Electronically Signed   By: Abigail Miyamoto M.D.   On: 04/26/2021 10:06    Medications:  sodium chloride 75 mL/hr at 04/25/21 2257   dialysis solution 1.5% low-MG/low-CA      aspirin EC  81 mg Oral Daily   atorvastatin   20 mg Oral Daily   calcitRIOL  0.25 mcg Oral Daily   feeding supplement  237 mL Oral BID BM   fludrocortisone  0.1 mg Oral Daily   gentamicin cream  1 application Topical Daily   heparin  5,000 Units Subcutaneous Q8H   midodrine  15 mg Oral TID WC   multivitamin  1 tablet Oral QHS   pantoprazole  40 mg Oral Daily   potassium chloride  40 mEq Oral BID    Dialysis Orders:  [facility] 7x per week, 43kg , 6 exchanges  , fill 2500cc , last fill 2000, no daytime exchange, dwell time 1.5h  Assessment/Plan: Dysphagia/ N+V/ Diarrhea/ wt loss - FTT, unclear GI issue vs other. Her symptoms could be due to PD failure (nausea, fatigue, wt loss), but that would be a diagnosis of exclusion and with excellent clearance and low BUN/Cr seems unlikely. GI evaluating, appreciate assistance. Completed swallow evaluation today. If general w/u does not explain her wt loss and other symptoms, will plan trial of HD.  Hypotension - acth stim test and TSH normal this am. Suspect mostly volume depletion. Cont IVF"s at lower dose 75 cc/hr. Midodrine recently raised to 15mg  tid-continue. ESRD - on PD for about 5 years. Cont regimen here for now.  Hypokalemia - K+ still high 2's, continue KCL to 40 po bid x 4 doses Volume - looks euvolemic on exam today, 2-3kg up by wts COPD Anemia ckd - Hgb now 10.5-monitor trends. 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 She is working with speech pathology this afternoon.  [place] John A Haydn Cush,MD 04/26/2021 2:02 PM

## 2021-04-26 NOTE — Progress Notes (Signed)
Progress Note   Subjective  Chief Complaint: Dysphagia  This morning patient has just returned from her esophagram.  She tells me that she was able to do it with some difficulty as it is hard for her to take big gulps.  Denies any acute events or changes in symptoms.   Objective   Vital signs in last 24 hours: Temp:  [97.6 F (36.4 C)-98.6 F (37 C)] 97.7 F (36.5 C) (12/19 0735) Pulse Rate:  [60-90] 61 (12/19 0735) Resp:  [12-18] 17 (12/19 0735) BP: (92-117)/(52-65) 107/59 (12/19 0735) SpO2:  [96 %-100 %] 98 % (12/19 0735) Weight:  [53.1 kg] 53.1 kg (12/19 0400) Last BM Date: 04/23/21 General:    AA female in NAD Heart:  Regular rate and rhythm; no murmurs Lungs: Respirations even and unlabored, lungs CTA bilaterally Abdomen:  Soft, nontender and nondistended. Normal bowel sounds. Psych:  Cooperative. Normal mood and affect.  Intake/Output from previous day: 12/18 0701 - 12/19 0700 In: 12502  Out: 13106    Lab Results: Recent Labs    04/25/21 0512 04/26/21 0237  WBC 7.3 9.2  HGB 10.9* 10.5*  HCT 34.0* 31.8*  PLT 249 268   BMET Recent Labs    04/24/21 0651 04/25/21 0512 04/26/21 0237  NA 134* 135 132*  K 3.3* 2.8* 2.9*  CL 101 103 104  CO2 23 21* 16*  GLUCOSE 80 132* 114*  BUN 24* 16 16  CREATININE 10.64* 8.50* 7.82*  CALCIUM 7.6* 7.9* 7.6*    Studies/Results: CT CHEST ABDOMEN PELVIS W CONTRAST  Result Date: 04/25/2021 CLINICAL DATA:  Occult malignancy. EXAM: CT CHEST, ABDOMEN, AND PELVIS WITH CONTRAST TECHNIQUE: Multidetector CT imaging of the chest, abdomen and pelvis was performed following the standard protocol during bolus administration of intravenous contrast. CONTRAST:  128mL OMNIPAQUE IOHEXOL 300 MG/ML  SOLN COMPARISON:  Chest CT 07/01/2020. CT abdomen and pelvis 06/07/2018. CT chest 07/29/2016. FINDINGS: CT CHEST FINDINGS Cardiovascular: No significant vascular findings. Normal heart size. No pericardial effusion. The main pulmonary artery  is enlarged compatible with pulmonary artery hypertension. Mediastinum/Nodes: There are bilateral thyroid nodules measuring up to 7 mm. There are no enlarged mediastinal or hilar lymph nodes identified. There is a small hiatal hernia. The esophagus is nondilated. Lungs/Pleura: There are new trace bilateral pleural effusions there is no pneumothorax. There is stable mild elevation left hemidiaphragm. Mild emphysematous changes are again noted. Partial lobectomy changes are seen in the left lung. There are stable scattered areas of bronchiectasis, scarring, and parenchymal opacities. Focal slightly nodular opacity in the right upper lobe measuring 1.8 by 1.1 cm image 5/47 has not significantly changed. There are no new suspicious pulmonary nodules or lung infiltrates. The trachea and central airways are patent. Musculoskeletal: No chest wall mass or suspicious bone lesions identified. Cervical spinal fusion plate is present. CT ABDOMEN PELVIS FINDINGS Hepatobiliary: No focal liver abnormality is seen. No gallstones, gallbladder wall thickening, or biliary dilatation. Pancreas: There is diffuse pancreatic ductal prominence measuring 2 mm similar to the prior examination. No focal pancreatic lesions are seen. No peripancreatic inflammation. Spleen: Small in size, unchanged. Adrenals/Urinary Tract: The kidneys are atrophic bilaterally. There is a hypodensity in the superior pole the left kidney which is too small to characterize, likely a cyst. There is no hydronephrosis. There is no excretion of contrast from the kidneys. Adrenal glands and bladder are within normal limits. Stomach/Bowel: Small hiatal hernia is unchanged. Stomach is otherwise within normal limits. No evidence of bowel wall thickening, distention,  or inflammatory changes. There is sigmoid colon diverticulosis without evidence for acute diverticulitis. The appendix is not seen. Vascular/Lymphatic: Aortic atherosclerosis. No enlarged abdominal or pelvic  lymph nodes. Reproductive: Status post hysterectomy. No adnexal masses. Other: Peritoneal dialysis catheter is seen in the abdomen ending in the pelvis. There is a small amount of free fluid in the pelvis. There is a small amount of free air in the upper abdomen likely related to peritoneal dialysis. There is diffuse body wall edema. Musculoskeletal: No acute or significant osseous findings. IMPRESSION: 1. New small bilateral pleural effusions. 2. Stable postsurgical changes, scarring, and bronchiectasis within the lungs. Scattered parenchymal opacities are unchanged including a slightly spiculated nodular density in the right upper lobe measuring 1.8 x 1.1 cm. No new suspicious pulmonary nodules or pulmonary infiltrates. 3. Subcentimeter incidental thyroid nodule. No follow-up imaging is recommended. Reference: J Am Coll Radiol. 2015 Feb;12(2): 143-50 4. Small amount of free air and free fluid in the abdomen are likely related to peritoneal dialysis. 5. Mild body wall edema. 6. No other acute localizing process in the abdomen or pelvis. 7. Sigmoid colon diverticulosis. 8. Aortic Atherosclerosis (ICD10-I70.0) and Emphysema (ICD10-J43.9). Electronically Signed   By: Ronney Asters M.D.   On: 04/25/2021 17:08   DG ESOPHAGUS W SINGLE CM (SOL OR THIN BA)  Result Date: 04/26/2021 CLINICAL DATA:  Difficulty swallowing. Evaluate epiphrenic diverticulum. EXAM: ESOPHAGUS/BARIUM SWALLOW/TABLET STUDY TECHNIQUE: Single contrast examination was performed using thin liquid barium. This exam was performed by Rushie Nyhan, and was supervised and interpreted by Abigail Miyamoto, M.D. FLUOROSCOPY TIME:  Radiation Exposure Index (as provided by the fluoroscopic device): Not applicable. If the device does not provide the exposure index: Fluoroscopy Time:  2 minutes and 30 seconds Number of Acquired Images:  None COMPARISON:  Chest CT 04/25/2021 FINDINGS: Focused, single-contrast exam performed with the patient in a supine,  minimally oblique position. Evaluation of esophageal peristalsis demonstrates tertiary contractions with proximal escape waves and contrast stasis in the upper esophagus. Full column evaluation of the esophagus demonstrates no persistent narrowing or stricture. An epiphrenic diverticulum is identified at 3.5 x 3.1 cm, eccentric right. IMPRESSION: 1. Esophageal dysmotility.  No other explanation for dysphagia. 2. 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 Electronically Signed   By: Abigail Miyamoto M.D.   On: 04/26/2021 10:06     Assessment / Plan:   Assessment: 1.  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  Hypotension: Better after addition of midodrine 3.  ESRD: On home regimen of peritoneal dialysis  Plan: 1.  Agree with speech pathology evaluation given dysmotility seen on esophagram. 2.  Blood pressure has been more stable today, will discuss if EGD can be planned in the next day or 2 with Dr. Rush Landmark. 3.  Continue current diet  Thank you for your kind consultation, we will continue to follow.   LOS: 2 days   Levin Erp  04/26/2021, 10:45 AM

## 2021-04-26 NOTE — Evaluation (Signed)
Clinical/Bedside Swallow Evaluation Patient Details  Name: [name] MRN: [id number] Date of Birth: August 27, 1945  Today's Date: 04/26/2021 Time: SLP Start Time (ACUTE ONLY): 4081 SLP Stop Time (ACUTE ONLY): 4481 SLP Time Calculation (min) (ACUTE ONLY): 15 min  Past Medical History:  Past Medical History:  Diagnosis Date   Acute kidney injury ([place]) 07/20/2016   Allergic rhinitis    Anxiety    CAP (community acquired pneumonia) 06/2016   [name] 06/19/2016   Cervical disc disease    /notes 07/20/2016   Chest pain at rest 06/18/2012   COPD (chronic obstructive pulmonary disease) ([place]) 05/23/2012   Diverticulosis    DJD (degenerative joint disease)    Esophageal diverticulum    Esophageal stricture    ESRD (end stage renal disease) ([place])    GERD (gastroesophageal reflux disease)    Hiatal hernia    History of blood transfusion 06/2016   "when I was hospitalized w/sepsis"   Hyperlipidemia 05/23/2012   T. Chol 234, LDL 130    Hypertension    IBS (irritable bowel syndrome)    Lung nodules    hx  with extensive workup including lung biopsy ruling out Sjogren's disease/notes 07/20/2016   Osteopenia    Recurrent sinusitis    /noters 07/20/2016   Renal insufficiency    Sinus headache    "daily lately" (07/20/2016)   Traumatic arthritis    Past Surgical History:  Past Surgical History:  Procedure Laterality Date   ANTERIOR CERVICAL DECOMP/DISCECTOMY FUSION  2005   BACK SURGERY     COLONOSCOPY  2001   /notes 09/21/2010   CYSTECTOMY Bilateral    hx/notes 09/21/2010   IR GENERIC HISTORICAL  07/20/2016   IR FLUORO GUIDE CV LINE RIGHT 07/20/2016 [name] Mariscal, MD [facility]   IR GENERIC HISTORICAL  07/20/2016   IR US GUIDE VASC ACCESS RIGHT 07/20/2016 [name] Mariscal, MD [facility]   LUNG BIOPSY Left    left side, not cancerous, thoracotomy   TONSILLECTOMY AND ADENOIDECTOMY     hx/notes 09/21/2010   VAGINAL HYSTERECTOMY     hx w/oophorectomy/notes 09/21/2010   HPI:  Pt is a 75 yo  female admitted with FTT with dysphagia, dairrhea, N/V, and weight loss. Pt describes solids feeling like they get stuck midsternum and at times are regurgitated. 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 PMH includes ESRD/PD, COPD, HTN, GERD    Assessment / Plan / Recommendation  Clinical Impression  Pt's oropharyngeal swallow appears to be functional without overt s/s of aspiration while consuming thin liquids and purees from full liquid diet. More solid textures were deferred at this time as GI eval is ongoing, but per pt report, she can consume foods at home if they are very finely chopped. Would advance to Dys 2 diet and thin liquids if appropriate from a GI standpoint. Will f/u briefly once more solids are initiated. General esophageal and aspiration precautions were reviewed with pt today. SLP Visit Diagnosis: Dysphagia, unspecified (R13.10)    Aspiration Risk  Mild aspiration risk    Diet Recommendation Dysphagia 2 (Fine chop);Thin liquid   Liquid Administration via: Cup;Straw Medication Administration: Crushed with puree Supervision: Patient able to self feed;Intermittent supervision to cue for compensatory strategies Compensations: Slow rate;Small sips/bites Postural Changes: Seated upright at 90 degrees;Remain upright for at least 30 minutes after po intake    Other  Recommendations Oral Care Recommendations: Oral care BID    Recommendations for follow up therapy are one component  of a multi-disciplinary discharge planning process, led by the attending physician.  Recommendations may be updated based on patient status, additional functional criteria and insurance authorization.  Follow up Recommendations No SLP follow up      Assistance Recommended at Discharge PRN  Functional Status Assessment Patient has had a recent decline in their functional status and demonstrates the ability to make significant  improvements in function in a reasonable and predictable amount of time.  Frequency and Duration min 1 x/week  1 week       Prognosis Prognosis for Safe Diet Advancement: Good      Swallow Study   General HPI: Pt is a 75 yo female admitted with FTT with dysphagia, dairrhea, N/V, and weight loss. Pt describes solids feeling like they get stuck midsternum and at times are regurgitated. 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 PMH includes ESRD/PD, COPD, HTN, GERD Type of Study: Bedside Swallow Evaluation Previous Swallow Assessment: none in chart Diet Prior to this Study: Thin liquids (Full liquid diet) Temperature Spikes Noted: No Respiratory Status: Room air History of Recent Intubation: No Behavior/Cognition: Alert;Cooperative;Pleasant mood Oral Cavity Assessment: Within Functional Limits Oral Care Completed by SLP: No Oral Cavity - Dentition: Adequate natural dentition;Dentures, top Vision: Functional for self-feeding Self-Feeding Abilities: Able to feed self Patient Positioning: Upright in bed Baseline Vocal Quality: Normal Volitional Swallow: Able to elicit    Oral/Motor/Sensory Function Overall Oral Motor/Sensory Function: Within functional limits   Ice Chips Ice chips: Not tested   Thin Liquid Thin Liquid: Within functional limits Presentation: Self Fed;Straw    Nectar Thick Nectar Thick Liquid: Not tested   Honey Thick Honey Thick Liquid: Not tested   Puree Puree: Within functional limits Presentation: Self Fed;Spoon   Solid     Solid: Not tested      [name]., M.A. [place] Pager [phone] Office [phone]  04/26/2021,2:23 PM

## 2021-04-26 NOTE — H&P (View-Only) (Signed)
Progress Note   Subjective  Chief Complaint: Dysphagia  This morning patient has just returned from her esophagram.  She tells me that she was able to do it with some difficulty as it is hard for her to take big gulps.  Denies any acute events or changes in symptoms.   Objective   Vital signs in last 24 hours: Temp:  [97.6 F (36.4 C)-98.6 F (37 C)] 97.7 F (36.5 C) (12/19 0735) Pulse Rate:  [60-90] 61 (12/19 0735) Resp:  [12-18] 17 (12/19 0735) BP: (92-117)/(52-65) 107/59 (12/19 0735) SpO2:  [96 %-100 %] 98 % (12/19 0735) Weight:  [53.1 kg] 53.1 kg (12/19 0400) Last BM Date: 04/23/21 General:    AA female in NAD Heart:  Regular rate and rhythm; no murmurs Lungs: Respirations even and unlabored, lungs CTA bilaterally Abdomen:  Soft, nontender and nondistended. Normal bowel sounds. Psych:  Cooperative. Normal mood and affect.  Intake/Output from previous day: 12/18 0701 - 12/19 0700 In: 12502  Out: 13106    Lab Results: Recent Labs    04/25/21 0512 04/26/21 0237  WBC 7.3 9.2  HGB 10.9* 10.5*  HCT 34.0* 31.8*  PLT 249 268   BMET Recent Labs    04/24/21 0651 04/25/21 0512 04/26/21 0237  NA 134* 135 132*  K 3.3* 2.8* 2.9*  CL 101 103 104  CO2 23 21* 16*  GLUCOSE 80 132* 114*  BUN 24* 16 16  CREATININE 10.64* 8.50* 7.82*  CALCIUM 7.6* 7.9* 7.6*    Studies/Results: CT CHEST ABDOMEN PELVIS W CONTRAST  Result Date: 04/25/2021 CLINICAL DATA:  Occult malignancy. EXAM: CT CHEST, ABDOMEN, AND PELVIS WITH CONTRAST TECHNIQUE: Multidetector CT imaging of the chest, abdomen and pelvis was performed following the standard protocol during bolus administration of intravenous contrast. CONTRAST:  129mL OMNIPAQUE IOHEXOL 300 MG/ML  SOLN COMPARISON:  Chest CT 07/01/2020. CT abdomen and pelvis 06/07/2018. CT chest 07/29/2016. FINDINGS: CT CHEST FINDINGS Cardiovascular: No significant vascular findings. Normal heart size. No pericardial effusion. The main pulmonary artery  is enlarged compatible with pulmonary artery hypertension. Mediastinum/Nodes: There are bilateral thyroid nodules measuring up to 7 mm. There are no enlarged mediastinal or hilar lymph nodes identified. There is a small hiatal hernia. The esophagus is nondilated. Lungs/Pleura: There are new trace bilateral pleural effusions there is no pneumothorax. There is stable mild elevation left hemidiaphragm. Mild emphysematous changes are again noted. Partial lobectomy changes are seen in the left lung. There are stable scattered areas of bronchiectasis, scarring, and parenchymal opacities. Focal slightly nodular opacity in the right upper lobe measuring 1.8 by 1.1 cm image 5/47 has not significantly changed. There are no new suspicious pulmonary nodules or lung infiltrates. The trachea and central airways are patent. Musculoskeletal: No chest wall mass or suspicious bone lesions identified. Cervical spinal fusion plate is present. CT ABDOMEN PELVIS FINDINGS Hepatobiliary: No focal liver abnormality is seen. No gallstones, gallbladder wall thickening, or biliary dilatation. Pancreas: There is diffuse pancreatic ductal prominence measuring 2 mm similar to the prior examination. No focal pancreatic lesions are seen. No peripancreatic inflammation. Spleen: Small in size, unchanged. Adrenals/Urinary Tract: The kidneys are atrophic bilaterally. There is a hypodensity in the superior pole the left kidney which is too small to characterize, likely a cyst. There is no hydronephrosis. There is no excretion of contrast from the kidneys. Adrenal glands and bladder are within normal limits. Stomach/Bowel: Small hiatal hernia is unchanged. Stomach is otherwise within normal limits. No evidence of bowel wall thickening, distention,  or inflammatory changes. There is sigmoid colon diverticulosis without evidence for acute diverticulitis. The appendix is not seen. Vascular/Lymphatic: Aortic atherosclerosis. No enlarged abdominal or pelvic  lymph nodes. Reproductive: Status post hysterectomy. No adnexal masses. Other: Peritoneal dialysis catheter is seen in the abdomen ending in the pelvis. There is a small amount of free fluid in the pelvis. There is a small amount of free air in the upper abdomen likely related to peritoneal dialysis. There is diffuse body wall edema. Musculoskeletal: No acute or significant osseous findings. IMPRESSION: 1. New small bilateral pleural effusions. 2. Stable postsurgical changes, scarring, and bronchiectasis within the lungs. Scattered parenchymal opacities are unchanged including a slightly spiculated nodular density in the right upper lobe measuring 1.8 x 1.1 cm. No new suspicious pulmonary nodules or pulmonary infiltrates. 3. Subcentimeter incidental thyroid nodule. No follow-up imaging is recommended. Reference: J Am Coll Radiol. 2015 Feb;12(2): 143-50 4. Small amount of free air and free fluid in the abdomen are likely related to peritoneal dialysis. 5. Mild body wall edema. 6. No other acute localizing process in the abdomen or pelvis. 7. Sigmoid colon diverticulosis. 8. Aortic Atherosclerosis (ICD10-I70.0) and Emphysema (ICD10-J43.9). Electronically Signed   By: Ronney Asters M.D.   On: 04/25/2021 17:08   DG ESOPHAGUS W SINGLE CM (SOL OR THIN BA)  Result Date: 04/26/2021 CLINICAL DATA:  Difficulty swallowing. Evaluate epiphrenic diverticulum. EXAM: ESOPHAGUS/BARIUM SWALLOW/TABLET STUDY TECHNIQUE: Single contrast examination was performed using thin liquid barium. This exam was performed by Rushie Nyhan, and was supervised and interpreted by Abigail Miyamoto, M.D. FLUOROSCOPY TIME:  Radiation Exposure Index (as provided by the fluoroscopic device): Not applicable. If the device does not provide the exposure index: Fluoroscopy Time:  2 minutes and 30 seconds Number of Acquired Images:  None COMPARISON:  Chest CT 04/25/2021 FINDINGS: Focused, single-contrast exam performed with the patient in a supine,  minimally oblique position. Evaluation of esophageal peristalsis demonstrates tertiary contractions with proximal escape waves and contrast stasis in the upper esophagus. Full column evaluation of the esophagus demonstrates no persistent narrowing or stricture. An epiphrenic diverticulum is identified at 3.5 x 3.1 cm, eccentric right. IMPRESSION: 1. Esophageal dysmotility.  No other explanation for dysphagia. 2. 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 Electronically Signed   By: Abigail Miyamoto M.D.   On: 04/26/2021 10:06     Assessment / Plan:   Assessment: 1.  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  Hypotension: Better after addition of midodrine 3.  ESRD: On home regimen of peritoneal dialysis  Plan: 1.  Agree with speech pathology evaluation given dysmotility seen on esophagram. 2.  Blood pressure has been more stable today, will discuss if EGD can be planned in the next day or 2 with Dr. Rush Landmark. 3.  Continue current diet  Thank you for your kind consultation, we will continue to follow.   LOS: 2 days   Levin Erp  04/26/2021, 10:45 AM

## 2021-04-27 ENCOUNTER — Encounter (HOSPITAL_COMMUNITY): Payer: Self-pay | Admitting: Internal Medicine

## 2021-04-27 ENCOUNTER — Inpatient Hospital Stay (HOSPITAL_COMMUNITY): Payer: Medicare HMO | Admitting: Critical Care Medicine

## 2021-04-27 ENCOUNTER — Ambulatory Visit: Payer: Medicare HMO | Admitting: Physician Assistant

## 2021-04-27 ENCOUNTER — Encounter (HOSPITAL_COMMUNITY)
Admission: EM | Disposition: A | Discharge status: Home Health | Payer: Self-pay | Source: Home / Self Care | Attending: Internal Medicine

## 2021-04-27 DIAGNOSIS — Q396 Congenital diverticulum of esophagus: Secondary | ICD-10-CM

## 2021-04-27 DIAGNOSIS — T18128A Food in esophagus causing other injury, initial encounter: Principal | ICD-10-CM

## 2021-04-27 DIAGNOSIS — W44F3XA Food entering into or through a natural orifice, initial encounter: Secondary | ICD-10-CM

## 2021-04-27 DIAGNOSIS — R1319 Other dysphagia: Secondary | ICD-10-CM

## 2021-04-27 DIAGNOSIS — K259 Gastric ulcer, unspecified as acute or chronic, without hemorrhage or perforation: Secondary | ICD-10-CM

## 2021-04-27 DIAGNOSIS — K209 Esophagitis, unspecified without bleeding: Secondary | ICD-10-CM

## 2021-04-27 HISTORY — PX: FOREIGN BODY REMOVAL: SHX962

## 2021-04-27 HISTORY — PX: BIOPSY: SHX5522

## 2021-04-27 HISTORY — PX: ESOPHAGOGASTRODUODENOSCOPY: SHX5428

## 2021-04-27 HISTORY — PX: SAVORY DILATION: SHX5439

## 2021-04-27 LAB — BASIC METABOLIC PANEL
Anion gap: 12 (ref 5–15)
BUN: 14 mg/dL (ref 8–23)
CO2: 18 mmol/L — ABNORMAL LOW (ref 22–32)
Calcium: 7.5 mg/dL — ABNORMAL LOW (ref 8.9–10.3)
Chloride: 104 mmol/L (ref 98–111)
Creatinine, Ser: 7.2 mg/dL — ABNORMAL HIGH (ref 0.44–1.00)
GFR, Estimated: 5 mL/min — ABNORMAL LOW (ref >60)
Glucose, Bld: 99 mg/dL (ref 70–99)
Potassium: 2.8 mmol/L — ABNORMAL LOW (ref 3.5–5.1)
Sodium: 134 mmol/L — ABNORMAL LOW (ref 135–145)

## 2021-04-27 LAB — CULTURE, BLOOD (ROUTINE X 2)
Culture: NO GROWTH
Special Requests: ADEQUATE

## 2021-04-27 SURGERY — EGD (ESOPHAGOGASTRODUODENOSCOPY)
Anesthesia: Monitor Anesthesia Care

## 2021-04-27 MED ORDER — SODIUM CHLORIDE 0.9 % IV SOLN: INTRAVENOUS | Status: DC

## 2021-04-27 MED ORDER — PHENOL 1.4 % MT LIQD
1.0000 | OROMUCOSAL | Status: DC | PRN
  Administered 2021-04-27: 13:00:00 1 via OROMUCOSAL
  Filled 2021-04-27: qty 177

## 2021-04-27 MED ORDER — POTASSIUM CHLORIDE CRYS ER 20 MEQ PO TBCR
40.0000 meq | EXTENDED_RELEASE_TABLET | Freq: Once | ORAL | Status: DC
  Filled 2021-04-27: qty 2

## 2021-04-27 MED ORDER — PROPOFOL 500 MG/50ML IV EMUL
INTRAVENOUS | Status: DC | PRN
  Administered 2021-04-27: 100 ug/kg/min via INTRAVENOUS

## 2021-04-27 MED ORDER — MENTHOL 3 MG MT LOZG
1.0000 | LOZENGE | OROMUCOSAL | Status: DC | PRN
  Administered 2021-04-27: 13:00:00 3 mg via ORAL
  Filled 2021-04-27: qty 9

## 2021-04-27 MED ORDER — PROPOFOL 10 MG/ML IV BOLUS
INTRAVENOUS | Status: DC | PRN
  Administered 2021-04-27 (×2): 10 mg via INTRAVENOUS
  Administered 2021-04-27: 20 mg via INTRAVENOUS

## 2021-04-27 MED ORDER — PHENYLEPHRINE HCL-NACL 20-0.9 MG/250ML-% IV SOLN
INTRAVENOUS | Status: DC | PRN
  Administered 2021-04-27: 25 ug/min via INTRAVENOUS

## 2021-04-27 MED ORDER — POTASSIUM CHLORIDE 20 MEQ PO PACK
40.0000 meq | PACK | Freq: Once | ORAL | Status: AC
  Administered 2021-04-27: 16:00:00 40 meq via ORAL
  Filled 2021-04-27: qty 2

## 2021-04-27 MED ORDER — POTASSIUM CHLORIDE 10 MEQ/100ML IV SOLN
10.0000 meq | INTRAVENOUS | Status: DC
  Administered 2021-04-27 (×2): 10 meq via INTRAVENOUS
  Filled 2021-04-27: qty 100

## 2021-04-27 MED ORDER — SENNOSIDES-DOCUSATE SODIUM 8.6-50 MG PO TABS
1.0000 | ORAL_TABLET | Freq: Every day | ORAL | Status: DC
  Administered 2021-04-27: 12:00:00 1 via ORAL
  Filled 2021-04-27 (×3): qty 1

## 2021-04-27 MED ORDER — PANTOPRAZOLE SODIUM 40 MG PO TBEC
40.0000 mg | DELAYED_RELEASE_TABLET | Freq: Two times a day (BID) | ORAL | Status: DC
  Administered 2021-04-27 – 2021-04-29 (×5): 40 mg via ORAL
  Filled 2021-04-27 (×5): qty 1

## 2021-04-27 NOTE — Care Management Important Message (Signed)
Important Message  Patient Details  Name: [name] MRN: [id number] Date of Birth: Feb 04, 1946   Medicare Important Message Given:  Yes     Orbie Pyo 04/27/2021, 3:24 PM

## 2021-04-27 NOTE — Progress Notes (Addendum)
[facility] Progress Note   Subjective:    Patient seen and examined at bedside. S/p Upper Endoscopy today by Dr. Rush Landmark. She reported tolerating the procedure. She reports PD went well overnight. Plan for PD tonight.   Objective Vitals:   04/27/21 1021 04/27/21 1036 04/27/21 1051 04/27/21 1153  BP: 104/66 (!) 101/56 (!) 103/50 101/66  Pulse: 78 74 74 74  Resp: 20 16 12 16   Temp:   98.1 F (36.7 C) 97.8 F (36.6 C)  TempSrc:    Oral  SpO2: 100% 100% 100% 98%  Weight:      Height:       Physical Exam General: Older woman; Alert; NAD Heart: Normal S1 and S2; No murmurs, gallops, or rubs Lungs: Clear throughout; No wheezing, rales, or rhonchi Abdomen: Soft and non-tender; active bowel sounds Extremities: No edema BLLE Dialysis Access: L PD catheter   Filed Weights   04/26/21 0400 04/26/21 0735 04/27/21 0849  Weight: 53.1 kg 47.4 kg 47.4 kg    Intake/Output Summary (Last 24 hours) at 04/27/2021 1252 Last data filed at 04/27/2021 0956 Gross per 24 hour  Intake 12924 ml  Output 630 ml  Net 12294 ml    Additional Objective Labs: Basic Metabolic Panel: Recent Labs  Lab 04/23/21 2049 04/24/21 0651 04/25/21 0512 04/26/21 0237 04/27/21 0123  NA 128*   < > 135 132* 134*  K 3.9   < > 2.8* 2.9* 2.8*  CL 99   < > 103 104 104  CO2 19*   < > 21* 16* 18*  GLUCOSE 131*   < > 132* 114* 99  BUN 28*   < > 16 16 14   CREATININE 11.71*   < > 8.50* 7.82* 7.20*  CALCIUM 7.6*   < > 7.9* 7.6* 7.5*  PHOS 4.3  --   --   --   --    < > = values in this interval not displayed.   Liver Function Tests: Recent Labs  Lab 04/22/21 2047  AST 31  ALT 19  ALKPHOS 66  BILITOT 0.9  PROT 5.6*  ALBUMIN 2.9*   No results for input(s): LIPASE, AMYLASE in the last 168 hours. CBC: Recent Labs  Lab 04/22/21 2047 04/25/21 0512 04/26/21 0237  WBC 5.9 7.3 9.2  NEUTROABS 4.7 6.1 7.7  HGB 11.2* 10.9* 10.5*  HCT 34.5* 34.0* 31.8*  MCV 99.1 98.6 96.7  PLT 254 249 268    Blood Culture    Component Value Date/Time   SDES BLOOD LEFT HAND 04/22/2021 2125   SPECREQUEST  04/22/2021 2125    BOTTLES DRAWN AEROBIC ONLY Blood Culture adequate volume   CULT  04/22/2021 2125    NO GROWTH 5 DAYS Performed at [facility], [place] [address] [place], [place] [address]    REPTSTATUS 04/27/2021 FINAL 04/22/2021 2125    Cardiac Enzymes: No results for input(s): CKTOTAL, CKMB, CKMBINDEX, TROPONINI in the last 168 hours. CBG: No results for input(s): GLUCAP in the last 168 hours. Iron Studies: No results for input(s): IRON, TIBC, TRANSFERRIN, FERRITIN in the last 72 hours. Lab Results  Component Value Date   INR 1.36 07/20/2016   INR 0.96 03/09/2009   INR 1.0 11/04/2008   Studies/Results: CT CHEST ABDOMEN PELVIS W CONTRAST  Result Date: 04/25/2021 CLINICAL DATA:  Occult malignancy. EXAM: CT CHEST, ABDOMEN, AND PELVIS WITH CONTRAST TECHNIQUE: Multidetector CT imaging of the chest, abdomen and pelvis was performed following the standard protocol during bolus administration of intravenous contrast.  CONTRAST:  134mL OMNIPAQUE IOHEXOL 300 MG/ML  SOLN COMPARISON:  Chest CT 07/01/2020. CT abdomen and pelvis 06/07/2018. CT chest 07/29/2016. FINDINGS: CT CHEST FINDINGS Cardiovascular: No significant vascular findings. Normal heart size. No pericardial effusion. The main pulmonary artery is enlarged compatible with pulmonary artery hypertension. Mediastinum/Nodes: There are bilateral thyroid nodules measuring up to 7 mm. There are no enlarged mediastinal or hilar lymph nodes identified. There is a small hiatal hernia. The esophagus is nondilated. Lungs/Pleura: There are new trace bilateral pleural effusions there is no pneumothorax. There is stable mild elevation left hemidiaphragm. Mild emphysematous changes are again noted. Partial lobectomy changes are seen in the left lung. There are stable scattered areas of bronchiectasis, scarring, and parenchymal opacities. Focal  slightly nodular opacity in the right upper lobe measuring 1.8 by 1.1 cm image 5/47 has not significantly changed. There are no new suspicious pulmonary nodules or lung infiltrates. The trachea and central airways are patent. Musculoskeletal: No chest wall mass or suspicious bone lesions identified. Cervical spinal fusion plate is present. CT ABDOMEN PELVIS FINDINGS Hepatobiliary: No focal liver abnormality is seen. No gallstones, gallbladder wall thickening, or biliary dilatation. Pancreas: There is diffuse pancreatic ductal prominence measuring 2 mm similar to the prior examination. No focal pancreatic lesions are seen. No peripancreatic inflammation. Spleen: Small in size, unchanged. Adrenals/Urinary Tract: The kidneys are atrophic bilaterally. There is a hypodensity in the superior pole the left kidney which is too small to characterize, likely a cyst. There is no hydronephrosis. There is no excretion of contrast from the kidneys. Adrenal glands and bladder are within normal limits. Stomach/Bowel: Small hiatal hernia is unchanged. Stomach is otherwise within normal limits. No evidence of bowel wall thickening, distention, or inflammatory changes. There is sigmoid colon diverticulosis without evidence for acute diverticulitis. The appendix is not seen. Vascular/Lymphatic: Aortic atherosclerosis. No enlarged abdominal or pelvic lymph nodes. Reproductive: Status post hysterectomy. No adnexal masses. Other: Peritoneal dialysis catheter is seen in the abdomen ending in the pelvis. There is a small amount of free fluid in the pelvis. There is a small amount of free air in the upper abdomen likely related to peritoneal dialysis. There is diffuse body wall edema. Musculoskeletal: No acute or significant osseous findings. IMPRESSION: 1. New small bilateral pleural effusions. 2. Stable postsurgical changes, scarring, and bronchiectasis within the lungs. Scattered parenchymal opacities are unchanged including a slightly  spiculated nodular density in the right upper lobe measuring 1.8 x 1.1 cm. No new suspicious pulmonary nodules or pulmonary infiltrates. 3. Subcentimeter incidental thyroid nodule. No follow-up imaging is recommended. Reference: J Am Coll Radiol. 2015 Feb;12(2): 143-50 4. Small amount of free air and free fluid in the abdomen are likely related to peritoneal dialysis. 5. Mild body wall edema. 6. No other acute localizing process in the abdomen or pelvis. 7. Sigmoid colon diverticulosis. 8. Aortic Atherosclerosis (ICD10-I70.0) and Emphysema (ICD10-J43.9). Electronically Signed   By: Ronney Asters M.D.   On: 04/25/2021 17:08   DG ESOPHAGUS W SINGLE CM (SOL OR THIN BA)  Result Date: 04/26/2021 CLINICAL DATA:  Difficulty swallowing. Evaluate epiphrenic diverticulum. EXAM: ESOPHAGUS/BARIUM SWALLOW/TABLET STUDY TECHNIQUE: Single contrast examination was performed using thin liquid barium. This exam was performed by Rushie Nyhan, and was supervised and interpreted by Abigail Miyamoto, M.D. FLUOROSCOPY TIME:  Radiation Exposure Index (as provided by the fluoroscopic device): Not applicable. If the device does not provide the exposure index: Fluoroscopy Time:  2 minutes and 30 seconds Number of Acquired Images:  None COMPARISON:  Chest CT  04/25/2021 FINDINGS: Focused, single-contrast exam performed with the patient in a supine, minimally oblique position. Evaluation of esophageal peristalsis demonstrates tertiary contractions with proximal escape waves and contrast stasis in the upper esophagus. Full column evaluation of the esophagus demonstrates no persistent narrowing or stricture. An epiphrenic diverticulum is identified at 3.5 x 3.1 cm, eccentric right. IMPRESSION: 1. Esophageal dysmotility.  No other explanation for dysphagia. 2. 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 Electronically Signed   By: Abigail Miyamoto M.D.   On: 04/26/2021 10:06    Medications:  sodium chloride 75 mL/hr at 04/27/21 1115   dialysis solution  1.5% low-MG/low-CA     potassium chloride 10 mEq (04/27/21 1131)    aspirin EC  81 mg Oral Daily   atorvastatin  20 mg Oral Daily   calcitRIOL  0.25 mcg Oral Daily   feeding supplement  237 mL Oral BID BM   fludrocortisone  0.1 mg Oral Daily   gentamicin cream  1 application Topical Daily   heparin  5,000 Units Subcutaneous Q8H   midodrine  15 mg Oral TID WC   multivitamin  1 tablet Oral QHS   pantoprazole  40 mg Oral BID AC   senna-docusate  1 tablet Oral Daily    Dialysis Orders: [facility] 7x per week, 43kg , 6 exchanges  , fill 2500cc , last fill 2000, no daytime exchange, dwell time 1.5h  Assessment/Plan: Dysphagia/ N+V/ Diarrhea/ wt loss - FTT, unclear GI issue vs other. Her symptoms could be due to PD failure (nausea, fatigue, wt loss)-currently seems unlikely since patient with excellent clearance and low BUN/Cr. GI evaluating. Completed swallow evaluation 04/26/21. S/p upper EGD today. Reviewed report-No gross lesions noted in esophagus; biopsied. If general w/u does not explain her wt loss and other symptoms, will plan trial of HD.  Hypotension - acth stim test and TSH normal. Suspect mostly volume depletion. Cont IVF"s at lower dose 75 cc/hr. Continue midodrine 15mg  tid-continue. ESRD - on PD for about 5 years. Cont regimen here for now.  Hypokalemia - K+ still high 2's, recently completed KCL to 40 po bid x 4 doses. Now receiving IV potassium. Follow trends closely. Checking labs in AM Volume - looks euvolemic on exam , 2-3kg up by wts COPD Anemia ckd - Hgb now 10.5-monitor trends. Checking labs in AM MBD ckd - Ca and phos in range, not on binders, not eating much anyways  Tobie Poet, NP [facility] 04/27/2021,12:52 PM  LOS: 3 days    I have seen and examined this patient and agree with plan and assessment in the above note with renal recommendations/intervention highlighted.  EGD with esophageal diverticulum and compacted foodstuffs, esophageal ulcers,  s/p esophageal dilation, also noted to have nonbleeding gastric ulcers.  Plan per GI.  Broadus John A Taiwo Fish,MD 04/27/2021 2:02 PM

## 2021-04-27 NOTE — Care Management (Signed)
Patient is active with [facility], confirmed w liaison Stacey. Placed resumption order for [facility] PT OT [facility].   Spoike with patient and updated.

## 2021-04-27 NOTE — Anesthesia Procedure Notes (Signed)
Procedure Name: Intubation Date/Time: 04/27/2021 9:23 AM Performed by: Wilburn Cornelia, CRNA Pre-anesthesia Checklist: Patient identified, Emergency Drugs available, Suction available, Patient being monitored and Timeout performed Oxygen Delivery Method: Nasal cannula Placement Confirmation: positive ETCO2 and breath sounds checked- equal and bilateral Dental Injury: Teeth and Oropharynx as per pre-operative assessment

## 2021-04-27 NOTE — Op Note (Signed)
[facility] Patient Name: [name] Procedure Date : 04/27/2021 MRN: [id number] Attending MD: Justice Britain , MD Date of Birth: [birth year] CSN: [id number] Age: 75 Admit Type: Inpatient Procedure:                Upper GI endoscopy Indications:              Dysphagia, Abnormal UGI series, Follow-up of                            Epiphrenic diverticulum Providers:                Justice Britain, MD, Kary Kos RN, RN, Tyna Jaksch Technician Referring MD:             Docia Chuck. [name], MD., [facility] Medicines:                Monitored Anesthesia Care Complications:            No immediate complications. Estimated Blood Loss:     Estimated blood loss was minimal. Procedure:                Pre-Anesthesia Assessment:                           - Prior to the procedure, a History and Physical                            was performed, and patient medications and                            allergies were reviewed. The patient's tolerance of                            previous anesthesia was also reviewed. The risks                            and benefits of the procedure and the sedation                            options and risks were discussed with the patient.                            All questions were answered, and informed consent                            was obtained. Prior Anticoagulants: The patient has                            taken no previous anticoagulant or antiplatelet                            agents except for aspirin. ASA Grade Assessment:  III - A patient with severe systemic disease. After                            reviewing the risks and benefits, the patient was                            deemed in satisfactory condition to undergo the                            procedure.                           After obtaining informed consent, the endoscope was                             passed under direct vision. Throughout the                            procedure, the patient's blood pressure, pulse, and                            oxygen saturations were monitored continuously. The                            [id number]-H190 ([id number]) Olympus endoscope was introduced                            through the mouth, and advanced to the second part                            of duodenum. The upper GI endoscopy was                            accomplished without difficulty. The patient                            tolerated the procedure. Scope In: Scope Out: Findings:      No gross lesions were noted in the proximal esophagus and in the mid       esophagus. Biopsies were taken with a cold forceps for histology to rule       out EoE/LoE.      A non-bleeding diverticulum with a large opening and no stigmata of       recent bleeding was found in the distal esophagus. Large amount of       foodstuffs were present within the diverticulum. Removal of food was       accomplished with a rat-toothed forceps and Roth net and suction via       endoscope. The diverticulum showed no evidence of significant       abnormality other than noted issue below.      One tongue of salmon-colored mucosa was present from 34 to 35 cm and       this extended into the diverticulum. Not clear if this is related to       foodstuffs that were lodged in the diverticulum vs underlying Barrett's.  Nodularity was present from 34 to 35 cm within the diverticulum.       Biopsies were taken with a cold forceps for histology.      LA Grade C (one or more mucosal breaks continuous between tops of 2 or       more mucosal folds, less than 75% circumference) esophagitis with no       bleeding was found at the gastroesophageal junction.      After the rest of the EGD was complete, a guidewire was placed and the       scope was withdrawn. Dilation was performed in the esophagus with a       Savary dilator with mild  resistance at 17 mm. The dilation site was       examined following endoscope reinsertion and showed just below the UES,       a moderate mucosal disruption and mild improvement in luminal narrowing       and no perforation. No mucosal wrenting noted in the distal esophagus       after dilation.      A 5 cm hiatal hernia was present.      Multiple non-bleeding cratered gastric ulcers with a clean ulcer base       (Forrest Class III) were found in the gastric antrum. The largest lesion       was 12 mm in largest dimension.      Patchy mild inflammation characterized by erosions, erythema and       friability was found in the entire examined stomach. Biopsies were taken       with a cold forceps for histology and Helicobacter pylori testing.      No gross lesions were noted in the duodenal bulb, in the first portion       of the duodenum and in the second portion of the duodenum. Impression:               - No gross lesions in esophagus proximally -                            biopsied.                           - Diverticulum in the distal esophagus with                            foodstuffs impacted that were removed. This was                            removed as noted above.                           - Within the diverticulum there was a tongue of                            salmon-colored mucosa and nodularity suspicious for                            Barrett's esophagus - biopsied.                           - LA  Grade C esophagitis with no bleeding noted                            distally.                           - Dilation performed in the esophagus with finding                            of a mucosal wrent in the region just distal to UES                            but not other findings after dilation.                           - 5 cm hiatal hernia. Non-bleeding gastric ulcers                            with a clean ulcer base (Forrest Class III).                             Gastritis - biopsied.                           - No gross lesions in the duodenal bulb, in the                            first portion of the duodenum and in the second                            portion of the duodenum. Recommendation:           - The patient will be observed post-procedure,                            until all discharge criteria are met.                           - Return patient to hospital ward for ongoing care.                           - Patient has a contact number available for                            emergencies. The signs and symptoms of potential                            delayed complications were discussed with the                            patient. Return to normal activities tomorrow.                            Written discharge instructions were provided to the  patient.                           - Dilation diet as per protocol. NPO x 2-hours.                            Then CLD x 2-hours. Then Full liquid diet x                            2-hours. If able to tolerate then soft diet                            thereafter.                           - Please use Cepacol or Halls Lozenges +/-                            Chloraseptic spray for next 72-96 hours to aid in                            sore thoat should you experience this.                           - Observe patient's clinical course.                           - Await pathology results.                           - PPI twice daily should be continued.                           - Follow up in outpatient GI clinic to see how                            dysphagia is doing. Suspect that if issues persist,                            before Thoracic surgery would consider surgical                            intervention with diverticulotomy and distal                            esophageal resection would need manometry.                           - Repeat upper endoscopy in 4  months to check                            healing.                           - The findings and recommendations were discussed  with the patient.                           - The findings and recommendations were discussed                            with the referring physician. Procedure Code(s):        --- Professional ---                           [phone], Esophagogastroduodenoscopy, flexible,                            transoral; with removal of foreign body(s)                           43248, Esophagogastroduodenoscopy, flexible,                            transoral; with insertion of guide wire followed by                            passage of dilator(s) through esophagus over guide                            wire Diagnosis Code(s):        --- Professional ---                           K22.8, Other specified diseases of esophagus                           Q39.6, Congenital diverticulum of esophagus                           K20.90, Esophagitis, unspecified without bleeding                           K44.9, Diaphragmatic hernia without obstruction or                            gangrene                           K25.9, Gastric ulcer, unspecified as acute or                            chronic, without hemorrhage or perforation                           K29.70, Gastritis, unspecified, without bleeding                           R13.10, Dysphagia, unspecified                           K22.5, Diverticulum of esophagus, acquired  R93.3, Abnormal findings on diagnostic imaging of                            other parts of digestive tract CPT copyright 2019 [employer]. All rights reserved. The codes documented in this report are preliminary and upon coder review may  be revised to meet current compliance requirements. Justice Britain, MD 04/27/2021 10:14:32 AM Number of Addenda: 0

## 2021-04-27 NOTE — Interval H&P Note (Signed)
History and Physical Interval Note:  04/27/2021 8:54 AM  [name]  has presented today for surgery, with the diagnosis of Dysphagia.  The various methods of treatment have been discussed with the patient and family. After consideration of risks, benefits and other options for treatment, the patient has consented to  Procedure(s): ESOPHAGOGASTRODUODENOSCOPY (EGD) (N/A) as a surgical intervention.  The patient's history has been reviewed, patient examined, no change in status, stable for surgery.  I have reviewed the patient's chart and labs.  Questions were answered to the patient's satisfaction.     [employer]

## 2021-04-27 NOTE — Anesthesia Preprocedure Evaluation (Addendum)
Anesthesia Evaluation  Patient identified by MRN, date of birth, ID band Patient awake    Reviewed: Allergy & Precautions, NPO status , Patient's Chart, lab work & pertinent test results  Airway Mallampati: II  TM Distance: >3 FB Neck ROM: Full    Dental  (+) Upper Dentures   Pulmonary COPD,  COPD inhaler, former smoker,    Pulmonary exam normal        Cardiovascular hypertension, negative cardio ROS Normal cardiovascular exam Rhythm:Regular Rate:Normal     Neuro/Psych  Headaches, Anxiety  Neuromuscular disease    GI/Hepatic Neg liver ROS, hiatal hernia, GERD  ,IBS (irritable bowel syndrome)   Endo/Other  Hypokalemia   Renal/GU ESRF and DialysisRenal disease     Musculoskeletal  (+) Arthritis ,   Abdominal   Peds  Hematology  (+) anemia ,   Anesthesia Other Findings Dysphagia  Reproductive/Obstetrics                            Anesthesia Physical Anesthesia Plan  ASA: 3  Anesthesia Plan: MAC   Post-op Pain Management:    Induction: Intravenous  PONV Risk Score and Plan: 2 and Propofol infusion and Treatment may vary due to age or medical condition  Airway Management Planned: Nasal Cannula  Additional Equipment:   Intra-op Plan:   Post-operative Plan:   Informed Consent: I have reviewed the patients History and Physical, chart, labs and discussed the procedure including the risks, benefits and alternatives for the proposed anesthesia with the patient or authorized representative who has indicated his/her understanding and acceptance.     Dental advisory given  Plan Discussed with: CRNA  Anesthesia Plan Comments:         Anesthesia Quick Evaluation

## 2021-04-27 NOTE — Transfer of Care (Signed)
Immediate Anesthesia Transfer of Care Note  Patient: [name]  Procedure(s) Performed: ESOPHAGOGASTRODUODENOSCOPY (EGD) FOREIGN BODY REMOVAL SAVORY DILATION BIOPSY  Patient Location: PACU  Anesthesia Type:MAC  Level of Consciousness: awake and alert   Airway & Oxygen Therapy: Patient Spontanous Breathing and Patient connected to nasal cannula oxygen  Post-op Assessment: Report given to RN and Post -op Vital signs reviewed and stable  Post vital signs: Reviewed and stable  Last Vitals:  Vitals Value Taken Time  BP 104/51 04/27/21 1006  Temp    Pulse 78 04/27/21 1008  Resp 18 04/27/21 1008  SpO2 100 % 04/27/21 1008  Vitals shown include unvalidated device data.  Last Pain:  Vitals:   04/27/21 0849  TempSrc: Temporal  PainSc: 0-No pain      Patients Stated Pain Goal: 0 (95/18/84 1660)  Complications: No notable events documented.

## 2021-04-27 NOTE — Anesthesia Postprocedure Evaluation (Signed)
Anesthesia Post Note  Patient: [name]  Procedure(s) Performed: ESOPHAGOGASTRODUODENOSCOPY (EGD) FOREIGN BODY REMOVAL SAVORY DILATION BIOPSY     Patient location during evaluation: Endoscopy Anesthesia Type: MAC Level of consciousness: awake Pain management: pain level controlled Vital Signs Assessment: post-procedure vital signs reviewed and stable Respiratory status: spontaneous breathing, nonlabored ventilation, respiratory function stable and patient connected to nasal cannula oxygen Cardiovascular status: stable and blood pressure returned to baseline Postop Assessment: no apparent nausea or vomiting Anesthetic complications: no   No notable events documented.  Last Vitals:  Vitals:   04/27/21 1548 04/27/21 1722  BP: (!) 95/49 93/64  Pulse: 70   Resp: 16 20  Temp: 36.8 C 36.9 C  SpO2: 100% 100%    Last Pain:  Vitals:   04/27/21 1722  TempSrc: Oral  PainSc: 0-No pain                 Krislyn Donnan P Eliot Popper

## 2021-04-27 NOTE — Progress Notes (Signed)
Progress Note    [name]   HFW:[id number]  DOB: 02-15-1946  DOA: 04/22/2021     3 PCP: [facility]  Initial CC: difficulty swallowing, weight loss   Hospital Course: [name] is a 75 y.o. female with medical history significant for ESRD on PD, hypertension, COPD, GERD who presented with multiple concerns.   She reports that since September she has been dealing with some sinus issues that she feels are still persistent.  Since that time she has been having trouble with solids feeling stuck with swallowing.  She is able to take liquids but would have nausea and vomiting immediately afterwards.  Also having persistent diarrhea.  Denies any abdominal pain or issues with her peritoneal dialysis.  No fever. She reports about a 25 pound weight loss since these symptoms started.  Currently she is feeling so weak she is having trouble doing bathing or even holding a spoon to eat.  Her blood pressure also drops with ambulation.   She has presented to ER numerous time for these issues and told she is dehydrated and has hypokalemia.  She has planned follow-up with GI outpatient but appointment is not until next week.  She was recently placed on midodrine TID PRN but has only been able to take it at most 1 time a day due to her nausea and vomiting. Her peritoneal dialysis recently has also been increased from 4 to 5 hours.   In the ED she was afebrile, borderline hypotensive with BP of 95/60. She has no leukocytosis or anemia.  However lactate was elevated 2.5 but improved down to 1.3 following IV fluid. Sodium of 133, K of 2.8, chloride of 98, CO2 20, creatinine of 12.39 which is near baseline.  BG of 125.   CT demonstrated persistent RUL nodule that is being followed as outpatient. Esophagram demonstrated esophageal dysmotility and a diverticulum.  She underwent EGD on 12/20.   Interval History:  No events overnight.  Seen in her room this afternoon after returning from  EGD.  Swallowing function appeared improved and she was amenable for trial of liquid diet.  Assessment & Plan:  Failure to thrive Dysphagia/presistent N/V and diarrhea  -Patient has had dysphagia for several months with GI outpatient follow-up next week.  She has had significant 25 pound weight loss due to this issue.  Also has been having nausea vomiting and diarrhea.  Cannot rule out potential malignancy - s/p EGD on 12/20, follow up biopsy results - continue  -multiple findings on EGD: Large nonbleeding diverticulum with wide opening found in distal esophagus; salmon-colored mucosa concerning for foodstuff versus Barrett's; LA grade C esophagitis with no evidence of bleeding; 5 cm hiatal hernia; esophageal dilation performed - advancing diet slowly; patient hesistant for FLD this afternoon; continue CLD for now and advance further as able - further outpatient followup - PPI BID - repeat EGD in 4 months   Hypotension Pt is on midodrine 15 mg tid. Florinef added. Blood pressure is improved. - stim test looks to be normal, given cortisol levels over 18   Hypokalemia Replete as needed    ESRD on PD  Nephrology has been consulted. PD is being managed by them as inpatient.    Old records reviewed in assessment of this patient  Antimicrobials:   DVT prophylaxis: HSQ  Code Status:   Code Status: Full Code  Disposition Plan:   Status is: Inpt  Objective: Blood pressure (!) 95/49, pulse 70, temperature 98.2 F (36.8 C),  temperature source Oral, resp. rate 16, height 5' (1.524 m), weight 47.4 kg, SpO2 100 %.  Examination:  Physical Exam Constitutional:      Appearance: Normal appearance.  HENT:     Head: Normocephalic and atraumatic.     Mouth/Throat:     Mouth: Mucous membranes are moist.  Eyes:     Extraocular Movements: Extraocular movements intact.  Cardiovascular:     Rate and Rhythm: Normal rate and regular rhythm.  Pulmonary:     Effort: Pulmonary effort is  normal.     Breath sounds: Normal breath sounds.  Abdominal:     General: Bowel sounds are normal. There is no distension.     Palpations: Abdomen is soft.     Tenderness: There is no abdominal tenderness.  Musculoskeletal:        General: Normal range of motion.     Cervical back: Normal range of motion and neck supple.  Skin:    General: Skin is warm and dry.  Neurological:     General: No focal deficit present.     Mental Status: She is alert.  Psychiatric:        Mood and Affect: Mood normal.        Behavior: Behavior normal.     Consultants:  GI Nephrology  Procedures:  EGD, 12/20  Data Reviewed: I have personally reviewed labs and imaging studies    LOS: 3 days  Time spent: Greater than 50% of the 35 minute visit was spent in counseling/coordination of care for the patient as laid out in the A&P.   Dwyane Dee, MD [facility] 04/27/2021, 4:09 PM

## 2021-04-28 ENCOUNTER — Encounter: Payer: Self-pay | Admitting: Gastroenterology

## 2021-04-28 LAB — RENAL FUNCTION PANEL
Albumin: 2.1 g/dL — ABNORMAL LOW (ref 3.5–5.0)
Anion gap: 9 (ref 5–15)
BUN: 12 mg/dL (ref 8–23)
CO2: 18 mmol/L — ABNORMAL LOW (ref 22–32)
Calcium: 7.5 mg/dL — ABNORMAL LOW (ref 8.9–10.3)
Chloride: 106 mmol/L (ref 98–111)
Creatinine, Ser: 6.77 mg/dL — ABNORMAL HIGH (ref 0.44–1.00)
GFR, Estimated: 6 mL/min — ABNORMAL LOW (ref >60)
Glucose, Bld: 126 mg/dL — ABNORMAL HIGH (ref 70–99)
Phosphorus: 2.4 mg/dL — ABNORMAL LOW (ref 2.5–4.6)
Potassium: 3.2 mmol/L — ABNORMAL LOW (ref 3.5–5.1)
Sodium: 133 mmol/L — ABNORMAL LOW (ref 135–145)

## 2021-04-28 LAB — CBC WITH DIFFERENTIAL/PLATELET
Abs Immature Granulocytes: 0.04 10*3/uL (ref 0.00–0.07)
Basophils Absolute: 0.1 10*3/uL (ref 0.0–0.1)
Basophils Relative: 1 %
Eosinophils Absolute: 0 10*3/uL (ref 0.0–0.5)
Eosinophils Relative: 0 %
HCT: 28.4 % — ABNORMAL LOW (ref 36.0–46.0)
Hemoglobin: 9.2 g/dL — ABNORMAL LOW (ref 12.0–15.0)
Immature Granulocytes: 1 %
Lymphocytes Relative: 6 %
Lymphs Abs: 0.5 10*3/uL — ABNORMAL LOW (ref 0.7–4.0)
MCH: 30.8 pg (ref 26.0–34.0)
MCHC: 32.4 g/dL (ref 30.0–36.0)
MCV: 95 fL (ref 80.0–100.0)
Monocytes Absolute: 0.5 10*3/uL (ref 0.1–1.0)
Monocytes Relative: 7 %
Neutro Abs: 6.4 10*3/uL (ref 1.7–7.7)
Neutrophils Relative %: 85 %
Platelets: 190 10*3/uL (ref 150–400)
RBC: 2.99 MIL/uL — ABNORMAL LOW (ref 3.87–5.11)
RDW: 15.6 % — ABNORMAL HIGH (ref 11.5–15.5)
WBC: 7.6 10*3/uL (ref 4.0–10.5)
nRBC: 0 % (ref 0.0–0.2)

## 2021-04-28 LAB — MAGNESIUM: Magnesium: 1.1 mg/dL — ABNORMAL LOW (ref 1.7–2.4)

## 2021-04-28 LAB — SURGICAL PATHOLOGY

## 2021-04-28 MED ORDER — ACETAMINOPHEN 325 MG PO TABS
650.0000 mg | ORAL_TABLET | Freq: Four times a day (QID) | ORAL | Status: DC | PRN
  Administered 2021-04-28: 13:00:00 650 mg via ORAL
  Filled 2021-04-28: qty 2

## 2021-04-28 MED ORDER — LOPERAMIDE HCL 1 MG/7.5ML PO SUSP
2.0000 mg | ORAL | Status: DC | PRN
  Administered 2021-04-29: 2 mg via ORAL
  Filled 2021-04-28 (×2): qty 15

## 2021-04-28 MED ORDER — FLUTICASONE FUROATE-VILANTEROL 100-25 MCG/ACT IN AEPB
1.0000 | INHALATION_SPRAY | Freq: Every day | RESPIRATORY_TRACT | Status: DC | PRN
  Filled 2021-04-28: qty 28

## 2021-04-28 MED ORDER — BOOST / RESOURCE BREEZE PO LIQD CUSTOM: 1.0000 | Freq: Two times a day (BID) | ORAL | Status: DC

## 2021-04-28 MED ORDER — ALBUTEROL SULFATE (2.5 MG/3ML) 0.083% IN NEBU: 2.5000 mg | INHALATION_SOLUTION | Freq: Four times a day (QID) | RESPIRATORY_TRACT | Status: DC | PRN

## 2021-04-28 MED ORDER — POTASSIUM CHLORIDE CRYS ER 20 MEQ PO TBCR: 40.0000 meq | EXTENDED_RELEASE_TABLET | Freq: Once | ORAL | Status: DC

## 2021-04-28 MED ORDER — ALBUTEROL SULFATE HFA 108 (90 BASE) MCG/ACT IN AERS
1.0000 | INHALATION_SPRAY | Freq: Four times a day (QID) | RESPIRATORY_TRACT | Status: DC | PRN
Start: 2021-04-28 — End: 2021-04-28

## 2021-04-28 MED ORDER — PROSOURCE PLUS PO LIQD
30.0000 mL | Freq: Three times a day (TID) | ORAL | Status: DC
  Filled 2021-04-28 (×2): qty 30

## 2021-04-28 MED ORDER — MAGNESIUM SULFATE 2 GM/50ML IV SOLN
2.0000 g | Freq: Once | INTRAVENOUS | Status: AC
  Administered 2021-04-28: 18:00:00 2 g via INTRAVENOUS
  Filled 2021-04-28: qty 50

## 2021-04-28 MED ORDER — MAGNESIUM SULFATE 4 GM/100ML IV SOLN
4.0000 g | Freq: Once | INTRAVENOUS | Status: AC
  Administered 2021-04-28: 16:00:00 4 g via INTRAVENOUS
  Filled 2021-04-28: qty 100

## 2021-04-28 NOTE — Progress Notes (Signed)
PROGRESS NOTE        PATIENT DETAILS Name: [name] Age: 75 y.o. Sex: female Date of Birth: 11-Dec-1945 Admit Date: 04/22/2021 Admitting Physician Ava Benny Lennert, DO [id number]  Brief Narrative: Patient is a 75 y.o. female with history of ESRD on PD, HTN, COPD, GERD-we will presented with dysphagia, failure to thrive syndrome, intermittent nausea/vomiting.  She was subsequently evaluated and admitted to the hospitalist service.  Subjective: Vomited several times last night.  Some nausea today but no vomiting.  Objective: Vitals: Blood pressure (!) 114/52, pulse 68, temperature 98.2 F (36.8 C), temperature source Oral, resp. rate 13, height 5' (1.524 m), weight 52 kg, SpO2 99 %.   Exam: Gen Exam:Alert awake-not in any distress HEENT:atraumatic, normocephalic Chest: B/L clear to auscultation anteriorly CVS:S1S2 regular Abdomen:soft non tender, non distended Extremities:no edema Neurology: Non focal Skin: no rash  Pertinent Labs/Radiology: Recent Labs  Lab 04/22/21 2047 04/23/21 0441 04/28/21 0144  WBC 5.9   < > 7.6  HGB 11.2*   < > 9.2*  PLT 254   < > 190  NA 133*   < > 133*  K 2.8*   < > 3.2*  CREATININE 12.39*   < > 6.77*  AST 31  --   --   ALT 19  --   --   ALKPHOS 66  --   --   BILITOT 0.9  --   --    < > = values in this interval not displayed.    Assessment/Plan: Dysphagia/nausea/vomiting: Unclear if this is from esophageal diverticulum with food impaction or from underlying motility disorder.  Patient is s/p EGD with removal of impacted food from the diverticulum-and dilatation of the esophagus.  She had some vomiting yesterday evening-continue to advance diet and see how she does.  If vomiting reoccurs-we will need to touch base with GI.  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  Nonbleeding gastric ulcers: Seen on EGD-continue PPI twice daily per GI recommendations.  Per GI-needs a repeat second look endoscopy in 4 months.  Hypotension: Stable-continue midodrine/Florinef.  ACTH stim test was negative for adrenal insufficiency.  Hypokalemia: Replete and recheck  HLD: Continue statin  ESRD on peritoneal dialysis: Nephrology following.  Normocytic anemia: Due to ESRD-defer Aranesp/iron to nephrology.  COPD: Continue bronchodilators-not in exacerbation  1.8 x 1.1 cm spiculated nodular density seen in the right upper lobe of the lung: Incidental finding-per radiology report-this is not a new finding.  Continue outpatient follow-up with PCCM/primary care practitioner.  Subcentimeter incidental thyroid nodule: Per radiology report-no further follow-up imaging is recommended  Nutrition Status: Nutrition Problem: Inadequate oral intake Etiology: dysphagia, acute illness, altered GI function Signs/Symptoms: per patient/family report Interventions: MVI, Refer to RD note for recommendations  BMI Estimated body mass index is 22.39 kg/m as calculated from the following:   Height as of this encounter: 5' (1.524 m).   Weight as of this encounter: 52 kg.    Procedures: None Consults: GI, nephrology DVT Prophylaxis: Heparin Code Status:Full code  Family Communication: None at bedside  Time spent: 25 minutes-Greater than 50% of this time was spent in counseling, explanation of diagnosis, planning of further management, and coordination of care.   Disposition Plan: Status is: Inpatient  Remains inpatient appropriate because: Continue advancing diet-assess for recurrence of vomiting-mobilize with nursing staff/PT-if remains stable-possible discharge in the next 24 to 48 hours.    Diet: Diet Order             DIET DYS 3 Room service appropriate? Yes; Fluid consistency: Thin  Diet effective now                      Antimicrobial agents: Anti-infectives (From admission, onward)    None        MEDICATIONS: Scheduled Meds:  aspirin EC  81 mg Oral Daily   atorvastatin  20 mg Oral Daily   calcitRIOL  0.25 mcg Oral Daily   feeding supplement  237 mL Oral BID BM   fludrocortisone  0.1 mg Oral Daily   gentamicin cream  1 application Topical Daily   heparin  5,000 Units Subcutaneous Q8H   midodrine  15 mg Oral TID WC   multivitamin  1 tablet Oral QHS   pantoprazole  40 mg Oral BID AC   senna-docusate  1 tablet Oral Daily   Continuous Infusions:  sodium chloride 75 mL/hr at 04/28/21 0316   dialysis solution 1.5% low-MG/low-CA     PRN Meds:.acetaminophen, lip balm, menthol-cetylpyridinium, ondansetron (ZOFRAN) IV, phenol, prochlorperazine   I have personally reviewed following labs and imaging studies  LABORATORY DATA: CBC: Recent Labs  Lab 04/22/21 2047 04/25/21 0512 04/26/21 0237 04/28/21 0144  WBC 5.9 7.3 9.2 7.6  NEUTROABS 4.7 6.1 7.7 6.4  HGB 11.2* 10.9* 10.5* 9.2*  HCT 34.5* 34.0* 31.8* 28.4*  MCV 99.1 98.6 96.7 95.0  PLT 254 249 268 376    Basic Metabolic Panel: Recent Labs  Lab 04/23/21 2049 04/24/21 0651 04/25/21 0512 04/26/21 0237 04/27/21 0123 04/28/21 0144  NA 128* 134* 135 132* 134* 133*  K 3.9 3.3* 2.8* 2.9* 2.8* 3.2*  CL 99 101 103 104 104 106  CO2 19* 23 21* 16* 18* 18*  GLUCOSE 131* 80 132* 114* 99 126*  BUN 28* 24* 16 16 14 12   CREATININE 11.71* 10.64* 8.50* 7.82* 7.20* 6.77*  CALCIUM 7.6* 7.6* 7.9* 7.6* 7.5* 7.5*  MG  --   --   --   --   --  1.1*  PHOS 4.3  --   --   --   --  2.4*    GFR: Estimated Creatinine Clearance: 5 mL/min (A) (by C-G formula based on SCr of 6.77 mg/dL (H)).  Liver Function Tests: Recent Labs  Lab 04/22/21 2047 04/28/21 0144  AST 31  --   ALT 19  --   ALKPHOS 66  --   BILITOT 0.9  --   PROT 5.6*  --   ALBUMIN 2.9* 2.1*   No results for input(s): LIPASE, AMYLASE in the last 168 hours. No results for  input(s): AMMONIA in the last 168 hours.  Coagulation Profile: No results for input(s): INR, PROTIME in the last 168 hours.  Cardiac Enzymes: No results for input(s): CKTOTAL, CKMB, CKMBINDEX, TROPONINI in the last 168 hours.  BNP (last 3 results) No results for input(s): PROBNP in the last 8760 hours.  Lipid Profile: No results for input(s): CHOL, HDL, LDLCALC, TRIG, CHOLHDL, LDLDIRECT in the last 72 hours.  Thyroid Function Tests: No results for input(s): TSH, T4TOTAL, FREET4, T3FREE, THYROIDAB in the last 72 hours.  Anemia Panel: No results for input(s): VITAMINB12, FOLATE, FERRITIN, TIBC, IRON, RETICCTPCT in the last 72 hours.  Urine analysis:  Component Value Date/Time   COLORURINE YELLOW 06/25/2019 2050   APPEARANCEUR HAZY (A) 06/25/2019 2050   LABSPEC 1.016 06/25/2019 2050   PHURINE 5.0 06/25/2019 2050   GLUCOSEU NEGATIVE 06/25/2019 2050   GLUCOSEU NEGATIVE 06/03/2014 1038   HGBUR NEGATIVE 06/25/2019 2050   [place] NEGATIVE 06/25/2019 2050   KETONESUR NEGATIVE 06/25/2019 2050   PROTEINUR NEGATIVE 06/25/2019 2050   UROBILINOGEN 0.2 06/03/2014 1038   NITRITE NEGATIVE 06/25/2019 2050   LEUKOCYTESUR TRACE (A) 06/25/2019 2050    Sepsis Labs: Lactic Acid, Venous    Component Value Date/Time   LATICACIDVEN 1.3 04/22/2021 2247    MICROBIOLOGY: Recent Results (from the past 240 hour(s))  Culture, blood (routine x 2)     Status: None   Collection Time: 04/22/21  9:25 PM   Specimen: BLOOD LEFT HAND  Result Value Ref Range Status   Specimen Description BLOOD LEFT HAND  Final   Special Requests   Final    BOTTLES DRAWN AEROBIC ONLY Blood Culture adequate volume   Culture   Final    NO GROWTH 5 DAYS Performed at [facility], [place] [address], [place], [place] [address]    Report Status 04/27/2021 FINAL  Final  Resp Panel by RT-PCR (Flu A&B, Covid) Nasopharyngeal Swab     Status: None   Collection Time: 04/23/21 12:28 AM   Specimen: Nasopharyngeal Swab;  Nasopharyngeal(NP) swabs in vial transport medium  Result Value Ref Range Status   SARS Coronavirus 2 by RT PCR NEGATIVE NEGATIVE Final    Comment: (NOTE) SARS-CoV-2 target nucleic acids are NOT DETECTED.  The SARS-CoV-2 RNA is generally detectable in upper respiratory specimens during the acute phase of infection. The lowest concentration of SARS-CoV-2 viral copies this assay can detect is 138 copies/mL. A negative result does not preclude SARS-Cov-2 infection and should not be used as the sole basis for treatment or other patient management decisions. A negative result may occur with  improper specimen collection/handling, submission of specimen other than nasopharyngeal swab, presence of viral mutation(s) within the areas targeted by this assay, and inadequate number of viral copies(<138 copies/mL). A negative result must be combined with clinical observations, patient history, and epidemiological information. The expected result is Negative.  Fact Sheet for Patients:  EntrepreneurPulse.com.au  Fact Sheet for Healthcare Providers:  IncredibleEmployment.be  This test is no t yet approved or cleared by the [place] FDA and  has been authorized for detection and/or diagnosis of SARS-CoV-2 by FDA under an Emergency Use Authorization (EUA). This EUA will remain  in effect (meaning this test can be used) for the duration of the COVID-19 declaration under Section 564(b)(1) of the Act, 21 U.S.C.section 360bbb-3(b)(1), unless the authorization is terminated  or revoked sooner.       Influenza A by PCR NEGATIVE NEGATIVE Final   Influenza B by PCR NEGATIVE NEGATIVE Final    Comment: (NOTE) The Xpert Xpress SARS-CoV-2/FLU/RSV plus assay is intended as an aid in the diagnosis of influenza from Nasopharyngeal swab specimens and should not be used as a sole basis for treatment. Nasal washings and aspirates are unacceptable for Xpert Xpress  SARS-CoV-2/FLU/RSV testing.  Fact Sheet for Patients: EntrepreneurPulse.com.au  Fact Sheet for Healthcare Providers: IncredibleEmployment.be  This test is not yet approved or cleared by the [place] FDA and has been authorized for detection and/or diagnosis of SARS-CoV-2 by FDA under an Emergency Use Authorization (EUA). This EUA will remain in effect (meaning this test can be used) for the duration of the  COVID-19 declaration under Section 564(b)(1) of the Act, 21 U.S.C. section 360bbb-3(b)(1), unless the authorization is terminated or revoked.  Performed at [facility], [place] [address] [place], [place] [address]     RADIOLOGY STUDIES/RESULTS: No results found.   LOS: 4 days   Oren Binet, MD  [facility]    To contact the attending provider between 7A-7P or the covering provider during after hours 7P-7A, please log into the web site [email or web address] and access using universal [place] password for that web site. If you do not have the password, please call the hospital operator.  04/28/2021, 12:37 PM

## 2021-04-28 NOTE — Progress Notes (Addendum)
[facility] Progress Note   Subjective:    Patient seen and examined at bedside. S/p Upper Endoscopy 04/27/21 by Dr. Rush Landmark. Reports nausea overnight. Denies SOB and CP. Continues to tolerate PD overnight. Plan for PD tonight.   Objective Vitals:   04/28/21 0500 04/28/21 0700 04/28/21 0703 04/28/21 1100  BP:   (!) 97/50 (!) 114/52  Pulse: (!) 57 (!) 59 78 68  Resp: 14 13 14 13   Temp:  98.3 F (36.8 C) 98.3 F (36.8 C) 98.2 F (36.8 C)  TempSrc:  Oral Oral Oral  SpO2: 97% 100% 99% 99%  Weight: 52 kg     Height:       Physical Exam General: Older woman; Alert; NAD Heart: Normal S1 and S2; No murmurs, gallops, or rubs Lungs: Clear throughout; No wheezing, rales, or rhonchi Abdomen: Soft and non-tender; active bowel sounds Extremities: No edema BLLE Dialysis Access: L PD catheter    Filed Weights   04/26/21 0735 04/27/21 0849 04/28/21 0500  Weight: 47.4 kg 47.4 kg 52 kg    Intake/Output Summary (Last 24 hours) at 04/28/2021 1331 Last data filed at 04/28/2021 0703 Gross per 24 hour  Intake 12617 ml  Output 12915 ml  Net -298 ml    Additional Objective Labs: Basic Metabolic Panel: Recent Labs  Lab 04/23/21 2049 04/24/21 0651 04/26/21 0237 04/27/21 0123 04/28/21 0144  NA 128*   < > 132* 134* 133*  K 3.9   < > 2.9* 2.8* 3.2*  CL 99   < > 104 104 106  CO2 19*   < > 16* 18* 18*  GLUCOSE 131*   < > 114* 99 126*  BUN 28*   < > 16 14 12   CREATININE 11.71*   < > 7.82* 7.20* 6.77*  CALCIUM 7.6*   < > 7.6* 7.5* 7.5*  PHOS 4.3  --   --   --  2.4*   < > = values in this interval not displayed.   Liver Function Tests: Recent Labs  Lab 04/22/21 2047 04/28/21 0144  AST 31  --   ALT 19  --   ALKPHOS 66  --   BILITOT 0.9  --   PROT 5.6*  --   ALBUMIN 2.9* 2.1*   No results for input(s): LIPASE, AMYLASE in the last 168 hours. CBC: Recent Labs  Lab 04/22/21 2047 04/25/21 0512 04/26/21 0237 04/28/21 0144  WBC 5.9 7.3 9.2 7.6  NEUTROABS  4.7 6.1 7.7 6.4  HGB 11.2* 10.9* 10.5* 9.2*  HCT 34.5* 34.0* 31.8* 28.4*  MCV 99.1 98.6 96.7 95.0  PLT 254 249 268 190   Blood Culture    Component Value Date/Time   SDES BLOOD LEFT HAND 04/22/2021 2125   SPECREQUEST  04/22/2021 2125    BOTTLES DRAWN AEROBIC ONLY Blood Culture adequate volume   CULT  04/22/2021 2125    NO GROWTH 5 DAYS Performed at [place] [address], [place], [place] [address]    REPTSTATUS 04/27/2021 FINAL 04/22/2021 2125    Cardiac Enzymes: No results for input(s): CKTOTAL, CKMB, CKMBINDEX, TROPONINI in the last 168 hours. CBG: No results for input(s): GLUCAP in the last 168 hours. Iron Studies: No results for input(s): IRON, TIBC, TRANSFERRIN, FERRITIN in the last 72 hours. Lab Results  Component Value Date   INR 1.36 07/20/2016   INR 0.96 03/09/2009   INR 1.0 11/04/2008   Studies/Results: No results found.  Medications:  dialysis solution 1.5% low-MG/low-CA  magnesium sulfate bolus IVPB     Followed by   magnesium sulfate bolus IVPB      aspirin EC  81 mg Oral Daily   atorvastatin  20 mg Oral Daily   calcitRIOL  0.25 mcg Oral Daily   feeding supplement  1 Container Oral BID BM   fludrocortisone  0.1 mg Oral Daily   gentamicin cream  1 application Topical Daily   heparin  5,000 Units Subcutaneous Q8H   midodrine  15 mg Oral TID WC   multivitamin  1 tablet Oral QHS   pantoprazole  40 mg Oral BID AC   potassium chloride  40 mEq Oral Once   senna-docusate  1 tablet Oral Daily    Dialysis Orders: CCPD 7x per week, 43kg , 6 exchanges  , fill 2500cc , last fill 2000, no daytime exchange, dwell time 1.5h  Assessment/Plan: Dysphagia/ N+V/ Diarrhea/ wt loss - FTT. 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 Completed swallow evaluation 04/26/21. S/p upper EGD 04/27/21-espohageal diverticulum, impacted foods, and esophageal ulcers-GI evaluating. If general w/u does not explain her  wt loss and other symptoms, will plan trial of HD.  Although she has excellent clearance it may be more mechanical from pressure of PD fluid on her stomach. Hypotension - acth stim test and TSH normal. Suspect mostly volume depletion. Cont IVF"s at lower dose 75 cc/hr. Continue midodrine 15mg  tid-continue. ESRD - on PD for about 5 years. Tolerating well. Cont regimen here for now.  Hypokalemia - Been receiving K+ supplementation IV and PO. K+ now 3.2-slight improvement-was in high 2s prior. Follow trends closely. Checking labs in AM Volume - looks euvolemic on exam , 2-3kg up by wts COPD Anemia ckd - Hgb trending down-now 9.2. Will order iron studies in AM. May need to start ESA. MBD ckd - Ca in range, PO4 and Albumin low, not on binders, not eating much. Will add protein supplement.  Tobie Poet, NP [facility] 04/28/2021,1:31 PM  LOS: 4 days    I have seen and examined this patient and agree with plan and assessment in the above note with renal recommendations/intervention highlighted.  Broadus John A Mong Neal,MD 04/28/2021 2:14 PM

## 2021-04-28 NOTE — Progress Notes (Signed)
Speech Language Pathology Treatment: Dysphagia  Patient Details Name: [name] MRN: [id number] DOB: October 29, 1945 Today's Date: 04/28/2021 Time: 3343-5686 SLP Time Calculation (min) (ACUTE ONLY): 16 min  Assessment / Plan / Recommendation Clinical Impression  Pt alert at EOB, consuming thin liquids upon SLP arrival. She reports (and RN confirms) some nausea that has affected her overall appetite post procedure. Thin liquids via straw, bites of puree and regular textured solid were consumed without overt s/sx of aspiration. Some globus sensation reported when taking pills whole with liquids, but otherwise were unremarkable for signs of aspiration. Recommend continue current diet and educated pt regarding universal swallow and esophageal precautions (upright positioning for meals and 30 minutes post intake, small bites/sips at slow rate, etc). She expressed that she may not be able to handle much solid food with her nausea at this time, but that she would order liquids/purees/soups that she feels she would tolerate. Above dicussed with RN. No further SLP f/u warranted. Will s/o.    HPI HPI: Pt is a 75 yo female admitted with FTT with dysphagia, dairrhea, N/V, and weight loss. Pt describes solids feeling like they get stuck midsternum and at times are regurgitated. 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 PMH includes ESRD/PD, COPD, HTN, GERD      SLP Plan  Discharge SLP treatment due to (comment);All goals met      Recommendations for follow up therapy are one component of a multi-disciplinary discharge planning process, led by the attending physician.  Recommendations may be updated based on patient status, additional functional criteria and insurance authorization.    Recommendations  Diet recommendations: Dysphagia 3 (mechanical soft);Thin liquid Liquids provided via: Cup;Straw Medication Administration: Whole meds  with puree Supervision: Patient able to self feed Compensations: Slow rate;Small sips/bites;Follow solids with liquid Postural Changes and/or Swallow Maneuvers: Seated upright 90 degrees;Upright 30-60 min after meal                Oral Care Recommendations: Oral care BID Follow Up Recommendations: No SLP follow up Assistance recommended at discharge: PRN SLP Visit Diagnosis: Dysphagia, unspecified (R13.10) Plan: Discharge SLP treatment due to (comment);All goals met          [name], [place], [place] Office Number: [phone]  [name]  04/28/2021, 10:07 AM

## 2021-04-28 NOTE — Progress Notes (Signed)
Nutrition Follow-up  DOCUMENTATION CODES:   Not applicable  INTERVENTION:   Continue Renal MVI daily Encourage good PO intake  Discontinue Ensure Enlive po BID, each supplement provides 350 kcal and 20 grams of protein-but pt with hx of indicating she does not like any ONS Boost Breeze po BID, each supplement provides 250 kcal and 9 grams of protein  NUTRITION DIAGNOSIS:   Inadequate oral intake related to dysphagia, acute illness, altered GI function as evidenced by per patient/family report. - Ongoing   GOAL:   Patient will meet greater than or equal to 90% of their needs - Progressing    MONITOR:   PO intake, Supplement acceptance, Labs, Weight trends  REASON FOR ASSESSMENT:   Malnutrition Screening Tool    ASSESSMENT:   75 yo female admitted with FTT with dysphagia, diarrhea, N/V and weight loss. PMH includes ESRD/PD, COPD, HTN, GERD  12/19 - diet advanced to Dys 2, thin liquids 12/20 - EGD 12/21 - diet advanced to Dys 3, thin liquids  Pt reports that she has only been drinking liquids since being here. States that she has been having difficulty swallowing for ~6 weeks, ever since September. Pt states that she has been to the ED 4 times for it since the beginning. Pt states that she has primarily been following a liquid diet at home. Pt denies using ONS at home, states that she does not do anything with milk. Pt reports that she has lost 25# since this all begin as well.   Pt reports that she can order her own food and does not need assistance. Pt declined snacks, said she will be fine until she gets home and can eat real food.   When discussing ONS with pt, RD asked pt if she likes juice, pt reports yes. RD will order Boost Breeze for now to provide additional calories and protein to the pt.  EGD Findings on 12/20:  - foodstuffs in diverticulum in distal esophagus - 5 cm hiatal hernia - dilation of esophagus performed  CCPD prescription: 1.5% Dextrose, 6  exchanges in 24 hours, 2L/exchange. Estimated calories absorbed: 240-312kcals/24 hours EDW: 43 kg  Medications reviewed and include: Calcitrol, Rena-Vit, Protonix, Senokot Labs reviewed:  - Sodium 133  - Potassium 3.2 - Phosphorus 2.4  - Magnesium  - Calcium 7.5   NUTRITION - FOCUSED PHYSICAL EXAM:  Deferred due to pt cleaning up.   Diet Order:   Diet Order             DIET DYS 3 Room service appropriate? Yes; Fluid consistency: Thin  Diet effective now                   EDUCATION NEEDS:   No education needs have been identified at this time  Skin:  Skin Assessment: Reviewed RN Assessment  Last BM:  04/23/2021  Height:   Ht Readings from Last 1 Encounters:  04/27/21 5' (1.524 m)    Weight:   Wt Readings from Last 1 Encounters:  04/28/21 52 kg     BMI:  Body mass index is 22.39 kg/m.  Estimated Nutritional Needs:   Kcal:  1550-1750 kcals  Protein:  80-90 g  Fluid:  >/= 1.5 L   Shauntee Karp Louie Casa, RD, LDN Clinical Dietitian See [facility] for contact information.

## 2021-04-29 ENCOUNTER — Other Ambulatory Visit (HOSPITAL_COMMUNITY): Payer: Self-pay

## 2021-04-29 ENCOUNTER — Encounter (HOSPITAL_COMMUNITY): Payer: Self-pay | Admitting: Gastroenterology

## 2021-04-29 LAB — PHOSPHORUS: Phosphorus: 2.4 mg/dL — ABNORMAL LOW (ref 2.5–4.6)

## 2021-04-29 LAB — BASIC METABOLIC PANEL
Anion gap: 10 (ref 5–15)
BUN: 13 mg/dL (ref 8–23)
CO2: 19 mmol/L — ABNORMAL LOW (ref 22–32)
Calcium: 8 mg/dL — ABNORMAL LOW (ref 8.9–10.3)
Chloride: 105 mmol/L (ref 98–111)
Creatinine, Ser: 7.04 mg/dL — ABNORMAL HIGH (ref 0.44–1.00)
GFR, Estimated: 6 mL/min — ABNORMAL LOW (ref >60)
Glucose, Bld: 109 mg/dL — ABNORMAL HIGH (ref 70–99)
Potassium: 2.7 mmol/L — CL (ref 3.5–5.1)
Sodium: 134 mmol/L — ABNORMAL LOW (ref 135–145)

## 2021-04-29 LAB — POTASSIUM: Potassium: 2.9 mmol/L — ABNORMAL LOW (ref 3.5–5.1)

## 2021-04-29 LAB — MAGNESIUM: Magnesium: 3.1 mg/dL — ABNORMAL HIGH (ref 1.7–2.4)

## 2021-04-29 LAB — IRON AND TIBC
Iron: 116 ug/dL (ref 28–170)
Saturation Ratios: 91 % — ABNORMAL HIGH (ref 10.4–31.8)
TIBC: 127 ug/dL — ABNORMAL LOW (ref 250–450)
UIBC: 11 ug/dL

## 2021-04-29 MED ORDER — PANTOPRAZOLE SODIUM 40 MG PO TBEC
40.0000 mg | DELAYED_RELEASE_TABLET | Freq: Two times a day (BID) | ORAL | 3 refills | Status: AC
  Filled 2021-04-29: qty 60, 30d supply, fill #0

## 2021-04-29 MED ORDER — FLUDROCORTISONE ACETATE 0.1 MG PO TABS
0.1000 mg | ORAL_TABLET | Freq: Every day | ORAL | 2 refills | Status: AC
  Filled 2021-04-29: qty 30, 30d supply, fill #0

## 2021-04-29 MED ORDER — POTASSIUM CHLORIDE 20 MEQ PO PACK
40.0000 meq | PACK | Freq: Once | ORAL | Status: AC
Start: 2021-04-29 — End: 2021-04-29
  Administered 2021-04-29: 03:00:00 40 meq via ORAL
  Filled 2021-04-29: qty 2

## 2021-04-29 MED ORDER — MIDODRINE HCL 10 MG PO TABS
15.0000 mg | ORAL_TABLET | Freq: Three times a day (TID) | ORAL | 1 refills | Status: AC
  Filled 2021-04-29: qty 90, 20d supply, fill #0

## 2021-04-29 NOTE — Evaluation (Signed)
Physical Therapy Evaluation Patient Details Name: [name] MRN: [id number] DOB: [birth year] Today's Date: 04/29/2021  History of Present Illness  75 y.o. female presented 04/23/21 with recent 25 lb weight loss due to dysphagia with N/V and generalized weakness. +hypotension; 12/20 endoscopy PMH significant for ESRD on PD, hypertension, COPD, GERD.  Clinical Impression   Patient evaluated by Physical Therapy with no further acute PT needs identified. Patient scored 28/56 on Berg Balance Assessment and can benefit from RW and HHPT upon discharge (noted being discharged today). She has family support and has an aide that is starting soon.  PT is signing off due to planned discharge.  Thank you for this referral.        Recommendations for follow up therapy are one component of a multi-disciplinary discharge planning process, led by the attending physician.  Recommendations may be updated based on patient status, additional functional criteria and insurance authorization.  Follow Up Recommendations Home health PT    Assistance Recommended at Discharge Frequent or constant Supervision/Assistance  Functional Status Assessment Patient has had a recent decline in their functional status and demonstrates the ability to make significant improvements in function in a reasonable and predictable amount of time.  Equipment Recommendations  Rolling walker (2 wheels)    Recommendations for Other Services       Precautions / Restrictions Precautions Precautions: Fall Precaution Comments: watch BP Restrictions Weight Bearing Restrictions: No      Mobility  Bed Mobility Overal bed mobility: Modified Independent             General bed mobility comments: incr time and effort    Transfers Overall transfer level: Needs assistance Equipment used: Rolling walker (2 wheels) Transfers: Sit to/from Stand Sit to Stand: Min guard           General transfer comment: pt able to recall  correct technique from earlier OT session    Ambulation/Gait Ambulation/Gait assistance: Min guard Gait Distance (Feet): 40 Feet Assistive device: Rolling walker (2 wheels) Gait Pattern/deviations: Step-through pattern;Decreased stride length;Shuffle   Gait velocity interpretation: 1.31 - 2.62 ft/sec, indicative of limited community ambulator   General Gait Details: in room with tight spacing which likely impacted shorter step length; vc with turning RW as she made a large movement (~90 degrees)  Stairs            Wheelchair Mobility    Modified Rankin (Stroke Patients Only)       Balance Overall balance assessment: Needs assistance Sitting-balance support: No upper extremity supported;Feet supported Sitting balance-Leahy Scale: Good     Standing balance support: Bilateral upper extremity supported;During functional activity Standing balance-Leahy Scale: Poor Standing balance comment: relies on BUE support dynamically                 Standardized Balance Assessment Standardized Balance Assessment : Berg Balance Test Berg Balance Test Sit to Stand: Needs minimal aid to stand or to stabilize Standing Unsupported: Able to stand 2 minutes with supervision Sitting with Back Unsupported but Feet Supported on Floor or Stool: Able to sit safely and securely 2 minutes Stand to Sit: Sits safely with minimal use of hands Transfers: Able to transfer with verbal cueing and /or supervision Standing Unsupported with Eyes Closed: Able to stand 10 seconds with supervision Standing Ubsupported with Feet Together: Needs help to attain position but able to stand for 30 seconds with feet together From Standing, Reach Forward with Outstretched Arm: Can reach forward >12 cm safely (5") From Standing  Position, Pick up Object from Floor: Unable to pick up and needs supervision From Standing Position, Turn to Look Behind Over each Shoulder: Turn sideways only but maintains balance Turn  360 Degrees: Needs close supervision or verbal cueing Standing Unsupported, Alternately Place Feet on Step/Stool: Able to complete >2 steps/needs minimal assist Standing Unsupported, One Foot in Front: Needs help to step but can hold 15 seconds Standing on One Leg: Tries to lift leg/unable to hold 3 seconds but remains standing independently Total Score: 28         Pertinent Vitals/Pain Pain Assessment: No/denies pain Faces Pain Scale: No hurt    Home Living Family/patient expects to be discharged to:: Private residence Living Arrangements: Alone Available Help at Discharge: Family;Friend(s);Available PRN/intermittently Type of Home: House Home Access: Level entry       Home Layout: One level Home Equipment: Grab bars - tub/shower;Hand held shower head;Cane - single point;Wheelchair - manual      Prior Function Prior Level of Function : Needs assist             Mobility Comments: uses cane or wheelchair, but reports she has been "too dizzy" to do much and "holds on to things" but no falls (she quickly finds a chair) ADLs Comments: needing assist for ADLs at times, but managing as able     Hand Dominance   Dominant Hand: Right    Extremity/Trunk Assessment   Upper Extremity Assessment Upper Extremity Assessment: Defer to OT evaluation    Lower Extremity Assessment Lower Extremity Assessment: Generalized weakness    Cervical / Trunk Assessment Cervical / Trunk Assessment: Normal  Communication   Communication: No difficulties  Cognition Arousal/Alertness: Awake/alert Behavior During Therapy: WFL for tasks assessed/performed Overall Cognitive Status: Within Functional Limits for tasks assessed                                          General Comments General comments (skin integrity, edema, etc.): orthostatic BPs recently checked by OT and per pt were low but normal; states she always runs low    Exercises     Assessment/Plan    PT  Assessment All further PT needs can be met in the next venue of care  PT Problem List Decreased strength;Decreased balance;Decreased mobility;Decreased knowledge of use of DME       PT Treatment Interventions      PT Goals (Current goals can be found in the Care Plan section)  Acute Rehab PT Goals Patient Stated Goal: Be able to get her strength back and walk without a device PT Goal Formulation: All assessment and education complete, DC therapy    Frequency     Barriers to discharge        Co-evaluation               AM-PAC PT "6 Clicks" Mobility  Outcome Measure Help needed turning from your back to your side while in a flat bed without using bedrails?: None Help needed moving from lying on your back to sitting on the side of a flat bed without using bedrails?: None Help needed moving to and from a bed to a chair (including a wheelchair)?: A Little Help needed standing up from a chair using your arms (e.g., wheelchair or bedside chair)?: A Little Help needed to walk in hospital room?: A Little Help needed climbing 3-5 steps with a railing? :  A Little 6 Click Score: 20    End of Session Equipment Utilized During Treatment: Gait belt Activity Tolerance: Patient tolerated treatment well Patient left: in bed;with call bell/phone within reach;with bed alarm set Nurse Communication: Mobility status PT Visit Diagnosis: Unsteadiness on feet (R26.81)    Time: 3794-4461 PT Time Calculation (min) (ACUTE ONLY): 13 min   Charges:   PT Evaluation $PT Eval Low Complexity: [place], PT Acute Rehabilitation Services  Pager [phone] Office [phone]   Rexanne Mano 04/29/2021, 2:14 PM

## 2021-04-29 NOTE — Evaluation (Signed)
Occupational Therapy Evaluation Patient Details Name: [name] MRN: [id number] DOB: [birth year] Today's Date: 04/29/2021   History of Present Illness 75 y.o. female presented 04/23/21 with recent 25 lb weight loss due to dysphagia with N/V and generalized weakness. +hypotension; 12/20 endoscopy PMH significant for ESRD on PD, hypertension, COPD, GERD.   Clinical Impression   PTA patient reports using cane vs wheelchair for mobility, some assist for ADLs/ IADls as needed from daughter/cousin.  Pt currently requires min guard for transfers and mobility in room for safety, min guard to supervision for ADLs.  Discussed safety and fall prevention, positional changes with increased time. Believe she will benefit from continued OT services while admitted and after dc at [facility] level to optimize independence, safety and tolerance for ADLs/IADLs.  Will follow acutely.      Recommendations for follow up therapy are one component of a multi-disciplinary discharge planning process, led by the attending physician.  Recommendations may be updated based on patient status, additional functional criteria and insurance authorization.   Follow Up Recommendations  Home health OT (and [facility] aide)    Assistance Recommended at Discharge Intermittent Supervision/Assistance  Functional Status Assessment  Patient has had a recent decline in their functional status and demonstrates the ability to make significant improvements in function in a reasonable and predictable amount of time.  Equipment Recommendations  Other (comment) (RW)    Recommendations for Other Services PT consult     Precautions / Restrictions Precautions Precautions: Fall Precaution Comments: watch BP Restrictions Weight Bearing Restrictions: No      Mobility Bed Mobility Overal bed mobility: Independent                  Transfers                          Balance Overall balance assessment: Needs  assistance Sitting-balance support: No upper extremity supported;Feet supported Sitting balance-Leahy Scale: Good     Standing balance support: Bilateral upper extremity supported;During functional activity Standing balance-Leahy Scale: Poor Standing balance comment: relies on BUE support dynamically                           ADL either performed or assessed with clinical judgement   ADL Overall ADL's : Needs assistance/impaired     Grooming: Supervision/safety;Sitting           Upper Body Dressing : Set up;Sitting   Lower Body Dressing: Min guard;Sit to/from stand   Toilet Transfer: Min guard;Ambulation;Rolling walker (2 wheels)           Functional mobility during ADLs: Min guard;Rolling walker (2 wheels)       Vision   Vision Assessment?: No apparent visual deficits     Perception     Praxis      Pertinent Vitals/Pain Pain Assessment: No/denies pain     Hand Dominance Right   Extremity/Trunk Assessment Upper Extremity Assessment Upper Extremity Assessment: Overall WFL for tasks assessed   Lower Extremity Assessment Lower Extremity Assessment: Defer to PT evaluation       Communication Communication Communication: No difficulties   Cognition Arousal/Alertness: Awake/alert Behavior During Therapy: WFL for tasks assessed/performed Overall Cognitive Status: Within Functional Limits for tasks assessed  General Comments  BP monitiored, soft but functional    Exercises     Shoulder Instructions      Home Living Family/patient expects to be discharged to:: Private residence Living Arrangements: Alone Available Help at Discharge: Family;Friend(s);Available PRN/intermittently Type of Home: House Home Access: Level entry     Home Layout: One level     Bathroom Shower/Tub: Teacher, early years/pre: Standard     Home Equipment: Grab bars - tub/shower;Hand held  shower head;Cane - single point;Wheelchair - manual          Prior Functioning/Environment Prior Level of Function : Needs assist             Mobility Comments: uses cane or wheelchair, but reports she has been "too dizzy" to do much and "holds on to things" but no falls (she quickly finds a chair) ADLs Comments: needing assist for ADLs at times, but managing as able        OT Problem List: Decreased strength;Decreased activity tolerance;Impaired balance (sitting and/or standing);Decreased knowledge of use of DME or AE;Decreased knowledge of precautions      OT Treatment/Interventions: Self-care/ADL training;Therapeutic exercise;DME and/or AE instruction;Therapeutic activities;Patient/family education;Balance training    OT Goals(Current goals can be found in the care plan section) Acute Rehab OT Goals Patient Stated Goal: home OT Goal Formulation: With patient Time For Goal Achievement: 05/13/21 Potential to Achieve Goals: Good  OT Frequency: Min 2X/week   Barriers to D/C:            Co-evaluation              AM-PAC OT "6 Clicks" Daily Activity     Outcome Measure Help from another person eating meals?: None Help from another person taking care of personal grooming?: A Little Help from another person toileting, which includes using toliet, bedpan, or urinal?: A Little Help from another person bathing (including washing, rinsing, drying)?: A Little Help from another person to put on and taking off regular upper body clothing?: A Little Help from another person to put on and taking off regular lower body clothing?: A Little 6 Click Score: 19   End of Session Equipment Utilized During Treatment: Rolling walker (2 wheels) Nurse Communication: Mobility status  Activity Tolerance: Patient tolerated treatment well Patient left: in bed;with call bell/phone within reach;with bed alarm set  OT Visit Diagnosis: Muscle weakness (generalized) (M62.81);Other  abnormalities of gait and mobility (R26.89)                Time: 9024-0973 OT Time Calculation (min): 23 min Charges:  OT General Charges $OT Visit: 1 Visit OT Evaluation $OT Eval Moderate Complexity: 1 Mod OT Treatments $Self Care/Home Management : 8-22 mins  Jolaine Artist, OT Acute Rehabilitation Services Pager [phone] Office [phone]   Delight Stare 04/29/2021, 2:01 PM

## 2021-04-29 NOTE — Progress Notes (Signed)
PT Cancellation Note  Patient Details Name: NIKEYA MAXIM MRN: [id number] DOB: [birth year]   Cancelled Treatment:    Reason Eval/Treat Not Completed: Patient at procedure or test/unavailable  Patient still dialyzing. Will proceed once completed.    Arby Barrette, PT Acute Rehabilitation Services  Pager [phone] Office [phone]    Rexanne Mano 04/29/2021, 11:10 AM

## 2021-04-29 NOTE — Progress Notes (Signed)
OT Cancellation Note  Patient Details Name: [name] MRN: [id number] DOB: 03-Jan-1946   Cancelled Treatment:    Reason Eval/Treat Not Completed: Patient at procedure or test/ unavailable- pt remains on dialysis. Will follow as see as able.   Jolaine Artist, OT Acute Rehabilitation Services Pager [phone] Office [phone]   Delight Stare 04/29/2021, 9:18 AM

## 2021-04-29 NOTE — Discharge Summary (Signed)
PATIENT DETAILS Name: [name] Age: 75 y.o. Sex: female Date of Birth: 1946-02-17 MRN: [id number]. Admitting Physician: Karie Kirks, DO XIH:[id number], [facility]  Admit Date: 04/22/2021 Discharge date: 04/29/2021  Recommendations for Outpatient Follow-up:  Follow up with PCP in 1-2 weeks Please obtain CMP/CBC in one week Please ensure follow-up with GI Spiculated right upper lobe lung nodule-not a new finding-defer further to PCP Subcentimeter thyroid nodules-defer further to PCP  Admitted From:  Home  Disposition: Home with home health services   [place]: Yes  Equipment/Devices: None  Discharge Condition: Stable  CODE STATUS: FULL CODE  Diet recommendation:  Diet Order             Diet - low sodium heart healthy           DIET DYS 3 Room service appropriate? Yes; Fluid consistency: Thin  Diet effective now                    Brief Summary: Patient is a 75 y.o. female with history of ESRD on PD, HTN, COPD, GERD-we will presented with dysphagia, failure to thrive syndrome, intermittent nausea/vomiting.  She was subsequently evaluated and admitted to the hospitalist service.  Brief Hospital Course: Dysphagia/nausea/vomiting: Unclear if this is from esophageal diverticulum with food impaction or from underlying motility disorder.  Patient is s/p EGD with removal of impacted food from the diverticulum-and dilatation of the esophagus.  No further vomiting-tolerating advancement in diet-now on a dysphagia 3 diet. GI plans to follow patient in the outpatient setting for second look endoscopy.  Per GI-if patient has recurrent dysphagia-May need cardiothoracic surgery evaluation for definite management of esophageal diverticulum.   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   Nonbleeding  gastric ulcers: Seen on EGD-continue PPI twice daily per GI recommendations.  Per GI-needs a repeat second look endoscopy in 4 months.   Hypotension: Stable-continue midodrine/Florinef.  ACTH stim test was negative for adrenal insufficiency.   Hypokalemia: Repleted on the day of discharge-please recheck in a week or so.   HLD: Continue statin   ESRD on peritoneal dialysis: Nephrology followed closely.  Nephrology aware of potential discharge plans.   Normocytic anemia: Due to ESRD-defer Aranesp/iron to nephrology.   COPD: Continue bronchodilators-not in exacerbation   1.8 x 1.1 cm spiculated nodular density seen in the right upper lobe of the lung: Incidental finding-per radiology report-this is not a new finding.  Continue outpatient follow-up with PCCM/primary care practitioner.   Subcentimeter incidental thyroid nodule: Per radiology report-no further follow-up imaging is recommended   Nutrition Status: Nutrition Problem: Inadequate oral intake Etiology: dysphagia, acute illness, altered GI function Signs/Symptoms: per patient/family report Interventions: MVI, Refer to RD note for recommendations   BMI Estimated body mass index is 22.39 kg/m as calculated from the following:   Height as of this encounter: 5' (1.524 m).   Weight as of this encounter: 52 kg.    Obesity: Estimated body mass index is 22.26 kg/m as calculated from the following:   Height as of this encounter: 5' (1.524 m).   Weight as of this encounter: 51.7 kg.   RN pressure injury documentation:    Procedures 12/20 >>EGD  Discharge Diagnoses:  Principal Problem:   Hypotension Active Problems:   ESRD (end stage renal disease) on dialysis ([place])   Failure to thrive in adult   Dysphagia  Food impaction of esophagus   Discharge Instructions:  Activity:  As tolerated with Full fall precautions use walker/cane & assistance as needed  Discharge Instructions     Diet - low sodium heart healthy    Complete by: As directed    Discharge instructions   Complete by: As directed    Follow with Primary MD  [facility] in 1-2 weeks  Follow-up with your gastroenterologist in 4 weeks for second look endoscopy evaluation.  You have a lung nodule in the right upper lobe of the lung-please ask your primary care practitioner to continue surveillance.  In some cases these nodules can turn into cancer.  You have subcentimeter size thyroid nodules-please ask your primary care practitioner for further work-up/evaluation.  Please get a complete blood count and chemistry panel checked by your Primary MD at your next visit, and again as instructed by your Primary MD.  Get Medicines reviewed and adjusted: Please take all your medications with you for your next visit with your Primary MD  Laboratory/radiological data: Please request your Primary MD to go over all hospital tests and procedure/radiological results at the follow up, please ask your Primary MD to get all Hospital records sent to his/her office.  In some cases, they will be blood work, cultures and biopsy results pending at the time of your discharge. Please request that your primary care M.D. follows up on these results.  Also Note the following: If you experience worsening of your admission symptoms, develop shortness of breath, life threatening emergency, suicidal or homicidal thoughts you must seek medical attention immediately by calling 911 or calling your MD immediately  if symptoms less severe.  You must read complete instructions/literature along with all the possible adverse reactions/side effects for all the Medicines you take and that have been prescribed to you. Take any new Medicines after you have completely understood and accpet all the possible adverse reactions/side effects.   Do not drive when taking Pain medications or sleeping medications (Benzodaizepines)  Do not take more than prescribed Pain, Sleep and  Anxiety Medications. It is not advisable to combine anxiety,sleep and pain medications without talking with your primary care practitioner  Special Instructions: If you have smoked or chewed Tobacco  in the last 2 yrs please stop smoking, stop any regular Alcohol  and or any Recreational drug use.  Wear Seat belts while driving.  Please note: You were cared for by a hospitalist during your hospital stay. Once you are discharged, your primary care physician will handle any further medical issues. Please note that NO REFILLS for any discharge medications will be authorized once you are discharged, as it is imperative that you return to your primary care physician (or establish a relationship with a primary care physician if you do not have one) for your post hospital discharge needs so that they can reassess your need for medications and monitor your lab values.   Increase activity slowly   Complete by: As directed    No wound care   Complete by: As directed       Allergies as of 04/29/2021       Reactions   Ace Inhibitors Anaphylaxis, Swelling, Other (See Comments)   Angioedema   Calcium Channel Blockers Anaphylaxis, Itching, Rash, Other (See Comments)   Red rash, Knot (sore and itches), and a fever for a couple days (per pt report)   Pneumococcal Vaccines Other (See Comments)   Red rash, Knot (sore and itches), A fever for a  couple days (per pt report)   Tetanus Toxoid Swelling, Other (See Comments)   FEVER and cellulitis in an arm   Latex Rash   Reaction to gloves   Tape Rash, Other (See Comments)   PLEASE USE PAPER TAPE!!        Medication List     STOP taking these medications    amoxicillin-clavulanate 875-125 MG tablet Commonly known as: AUGMENTIN   benzonatate 100 MG capsule Commonly known as: TESSALON   torsemide 20 MG tablet Commonly known as: DEMADEX       TAKE these medications    acetaminophen 500 MG tablet Commonly known as: TYLENOL Take 1,000 mg by  mouth every 6 (six) hours as needed (for headaches or pain).   albuterol 108 (90 Base) MCG/ACT inhaler Commonly known as: VENTOLIN HFA Inhale 1-2 puffs into the lungs every 6 (six) hours as needed for wheezing or shortness of breath.   aspirin EC 81 MG tablet Take 81 mg by mouth daily.   atorvastatin 20 MG tablet Commonly known as: LIPITOR Take 20 mg by mouth daily. What changed: Another medication with the same name was removed. Continue taking this medication, and follow the directions you see here.   Breo Ellipta 100-25 MCG/ACT Aepb Generic drug: fluticasone furoate-vilanterol Inhale 1 puff into the lungs daily as needed (shortness of breath).   calcitRIOL 0.25 MCG capsule Commonly known as: ROCALTROL Take 0.25 mcg by mouth daily.   Claritin-D 12 Hour 5-120 MG tablet Generic drug: loratadine-pseudoephedrine Take 1 tablet by mouth 2 (two) times daily as needed for allergies.   Colace 100 MG capsule Generic drug: docusate sodium Take 100 mg by mouth daily as needed for mild constipation.   cyclobenzaprine 5 MG tablet Commonly known as: FLEXERIL Take 5 mg by mouth 3 (three) times daily as needed for muscle spasms.   ergocalciferol 1.25 MG (50000 UT) capsule Commonly known as: VITAMIN D2 Take 50,000 Units by mouth every Sunday.   ferric citrate 1 GM 210 MG(Fe) tablet Commonly known as: AURYXIA Take 210 mg by mouth 3 (three) times daily with meals.   fludrocortisone 0.1 MG tablet Commonly known as: FLORINEF Take 1 tablet (0.1 mg total) by mouth daily. Start taking on: April 30, 2021   fluticasone 50 MCG/ACT nasal spray Commonly known as: FLONASE Place 1 spray into both nostrils daily as needed for allergies or rhinitis.   melatonin 5 MG Tabs Take 5 mg by mouth at bedtime as needed (for sleep).   midodrine 10 MG tablet Commonly known as: PROAMATINE Take 1.5 tablets (15 mg total) by mouth 3 (three) times daily with meals. What changed:  medication  strength how much to take   multivitamin Tabs tablet Take 1 tablet by mouth daily.   Neti Pot Sinus Wash 2300-700 MG Kit Generic drug: Sodium Chloride-Sodium Bicarb Place 1 application into the nose 2 (two) times daily as needed. What changed: reasons to take this   ondansetron 4 MG disintegrating tablet Commonly known as: ZOFRAN-ODT Take 4 mg by mouth every 6 (six) hours as needed for nausea.   pantoprazole 40 MG tablet Commonly known as: PROTONIX Take 1 tablet (40 mg total) by mouth 2 (two) times daily. What changed: when to take this   Potassium Chloride ER 20 MEQ Tbcr Take 20 mEq by mouth daily.   sodium chloride 0.65 % Soln nasal spray Commonly known as: OCEAN Place 2 sprays into both nostrils daily as needed for congestion.  Follow-up Information     [facility] Follow up.   Specialty: Home Health Services Contact information: [place] [address] [address], [place] Schedule an appointment as soon as possible for a visit in 1 week(s).   Contact information: [place] [address] [phone]         [facility] Follow up.   Specialty: Gastroenterology Why: Office will call with date/time, If you dont hear from them,please give them a call Contact information: [place] [address] [phone]               Allergies  Allergen Reactions   Ace Inhibitors Anaphylaxis, Swelling and Other (See Comments)    Angioedema   Calcium Channel Blockers Anaphylaxis, Itching, Rash and Other (See Comments)    Red rash, Knot (sore and itches), and a fever for a couple days (per pt report)   Pneumococcal Vaccines Other (See Comments)    Red rash, Knot (sore and itches), A fever for a couple days (per pt report)   Tetanus Toxoid Swelling and Other (See Comments)    FEVER and cellulitis in an arm     Latex Rash     Reaction to gloves   Tape Rash and Other (See Comments)    PLEASE USE PAPER TAPE!!      Consultations:  GI and nephrology   Other Procedures/Studies: DG Chest 2 View  Result Date: 04/12/2021 CLINICAL DATA:  Hypotension EXAM: CHEST - 2 VIEW COMPARISON:  Previous studies including the examination of 04/03/2021 FINDINGS: Cardiac size is within normal limits. Low position of diaphragms and increase in AP diameter of chest suggests COPD. Surgical staples are seen in the left lung. There are subtle increased interstitial markings in the both upper lung fields with no significant change. There is no new focal pulmonary consolidation. There is blunting of left lateral CP angle. Left hemidiaphragm is elevated. There is no pneumothorax. IMPRESSION: COPD.  There are no new infiltrates or signs of pulmonary edema. Electronically Signed   By: Elmer Picker M.D.   On: 04/12/2021 14:01   CT CHEST ABDOMEN PELVIS W CONTRAST  Result Date: 04/25/2021 CLINICAL DATA:  Occult malignancy. EXAM: CT CHEST, ABDOMEN, AND PELVIS WITH CONTRAST TECHNIQUE: Multidetector CT imaging of the chest, abdomen and pelvis was performed following the standard protocol during bolus administration of intravenous contrast. CONTRAST:  138m OMNIPAQUE IOHEXOL 300 MG/ML  SOLN COMPARISON:  Chest CT 07/01/2020. CT abdomen and pelvis 06/07/2018. CT chest 07/29/2016. FINDINGS: CT CHEST FINDINGS Cardiovascular: No significant vascular findings. Normal heart size. No pericardial effusion. The main pulmonary artery is enlarged compatible with pulmonary artery hypertension. Mediastinum/Nodes: There are bilateral thyroid nodules measuring up to 7 mm. There are no enlarged mediastinal or hilar lymph nodes identified. There is a small hiatal hernia. The esophagus is nondilated. Lungs/Pleura: There are new trace bilateral pleural effusions there is no pneumothorax. There is stable mild elevation left hemidiaphragm. Mild emphysematous changes are  again noted. Partial lobectomy changes are seen in the left lung. There are stable scattered areas of bronchiectasis, scarring, and parenchymal opacities. Focal slightly nodular opacity in the right upper lobe measuring 1.8 by 1.1 cm image 5/47 has not significantly changed. There are no new suspicious pulmonary nodules or lung infiltrates. The trachea and central airways are patent. Musculoskeletal: No chest wall mass or suspicious bone lesions identified. Cervical spinal fusion plate is  present. CT ABDOMEN PELVIS FINDINGS Hepatobiliary: No focal liver abnormality is seen. No gallstones, gallbladder wall thickening, or biliary dilatation. Pancreas: There is diffuse pancreatic ductal prominence measuring 2 mm similar to the prior examination. No focal pancreatic lesions are seen. No peripancreatic inflammation. Spleen: Small in size, unchanged. Adrenals/Urinary Tract: The kidneys are atrophic bilaterally. There is a hypodensity in the superior pole the left kidney which is too small to characterize, likely a cyst. There is no hydronephrosis. There is no excretion of contrast from the kidneys. Adrenal glands and bladder are within normal limits. Stomach/Bowel: Small hiatal hernia is unchanged. Stomach is otherwise within normal limits. No evidence of bowel wall thickening, distention, or inflammatory changes. There is sigmoid colon diverticulosis without evidence for acute diverticulitis. The appendix is not seen. Vascular/Lymphatic: Aortic atherosclerosis. No enlarged abdominal or pelvic lymph nodes. Reproductive: Status post hysterectomy. No adnexal masses. Other: Peritoneal dialysis catheter is seen in the abdomen ending in the pelvis. There is a small amount of free fluid in the pelvis. There is a small amount of free air in the upper abdomen likely related to peritoneal dialysis. There is diffuse body wall edema. Musculoskeletal: No acute or significant osseous findings. IMPRESSION: 1. New small bilateral  pleural effusions. 2. Stable postsurgical changes, scarring, and bronchiectasis within the lungs. Scattered parenchymal opacities are unchanged including a slightly spiculated nodular density in the right upper lobe measuring 1.8 x 1.1 cm. No new suspicious pulmonary nodules or pulmonary infiltrates. 3. Subcentimeter incidental thyroid nodule. No follow-up imaging is recommended. Reference: J Am Coll Radiol. 2015 Feb;12(2): 143-50 4. Small amount of free air and free fluid in the abdomen are likely related to peritoneal dialysis. 5. Mild body wall edema. 6. No other acute localizing process in the abdomen or pelvis. 7. Sigmoid colon diverticulosis. 8. Aortic Atherosclerosis (ICD10-I70.0) and Emphysema (ICD10-J43.9). Electronically Signed   By: Ronney Asters M.D.   On: 04/25/2021 17:08   DG Chest Portable 1 View  Result Date: 04/22/2021 CLINICAL DATA:  Weakness EXAM: PORTABLE CHEST 1 VIEW COMPARISON:  04/12/2021 FINDINGS: There is hyperinflation of the lungs compatible with COPD. Mild elevation of the left hemidiaphragm. Postoperative changes in the left lung. No confluent airspace opacities or effusions. Heart is normal size. No acute bony abnormality. IMPRESSION: COPD/chronic changes.  No active disease. Electronically Signed   By: Rolm Baptise M.D.   On: 04/22/2021 21:12   DG Chest Portable 1 View  Result Date: 04/03/2021 CLINICAL DATA:  Fatigue, postnasal drip, sinus congestion, vomiting up phlegm, shortness of breath, and hypotension for 3 months, on dialysis, history COPD, hypertension, former smoker EXAM: PORTABLE CHEST 1 VIEW COMPARISON:  Portable exam 1612 hours compared to 03/18/2021 FINDINGS: Normal heart size, mediastinal contours, and pulmonary vascularity. Atherosclerotic calcification aorta. Postsurgical changes of the LEFT lung. Underlying emphysematous changes and central peribronchial thickening consistent with COPD. No acute infiltrate, pleural effusion, or pneumothorax. Bones  demineralized with levoconvex thoracic scoliosis and prior cervical fusion. IMPRESSION: COPD changes with postsurgical changes of the LEFT lung. No acute abnormalities. Aortic Atherosclerosis (ICD10-I70.0) and Emphysema (ICD10-J43.9). Electronically Signed   By: Lavonia Dana M.D.   On: 04/03/2021 16:38   DG ESOPHAGUS W SINGLE CM (SOL OR THIN BA)  Result Date: 04/26/2021 CLINICAL DATA:  Difficulty swallowing. Evaluate epiphrenic diverticulum. EXAM: ESOPHAGUS/BARIUM SWALLOW/TABLET STUDY TECHNIQUE: Single contrast examination was performed using thin liquid barium. This exam was performed by Rushie Nyhan, and was supervised and interpreted by Abigail Miyamoto, M.D. FLUOROSCOPY TIME:  Radiation Exposure Index (as provided  by the fluoroscopic device): Not applicable. If the device does not provide the exposure index: Fluoroscopy Time:  2 minutes and 30 seconds Number of Acquired Images:  None COMPARISON:  Chest CT 04/25/2021 FINDINGS: Focused, single-contrast exam performed with the patient in a supine, minimally oblique position. Evaluation of esophageal peristalsis demonstrates tertiary contractions with proximal escape waves and contrast stasis in the upper esophagus. Full column evaluation of the esophagus demonstrates no persistent narrowing or stricture. An epiphrenic diverticulum is identified at 3.5 x 3.1 cm, eccentric right. IMPRESSION: 1. Esophageal dysmotility.  No other explanation for dysphagia. 2. 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 Electronically Signed   By: Abigail Miyamoto M.D.   On: 04/26/2021 10:06     TODAY-DAY OF DISCHARGE:  Subjective:   Khalia Gong today has no headache,no chest abdominal pain,no new weakness tingling or numbness, feels much better wants to go home today.   Objective:   Blood pressure (!) 92/51, pulse 60, temperature 98.2 F (36.8 C), temperature source Oral, resp. rate 14, height 5' (1.524 m), weight 51.7 kg, SpO2 100 %.  Intake/Output Summary (Last 24 hours) at  04/29/2021 1107 Last data filed at 04/28/2021 1807 Gross per 24 hour  Intake 2171.57 ml  Output --  Net 2171.57 ml   Filed Weights   04/27/21 0849 04/28/21 0500 04/29/21 0400  Weight: 47.4 kg 52 kg 51.7 kg    Exam: Awake Alert, Oriented *3, No new F.N deficits, Normal affect [place].AT,PERRAL Supple Neck,No JVD, No cervical lymphadenopathy appriciated.  Symmetrical Chest wall movement, Good air movement bilaterally, CTAB RRR,No Gallops,Rubs or new Murmurs, No Parasternal Heave +ve B.Sounds, Abd Soft, Non tender, No organomegaly appriciated, No rebound -guarding or rigidity. No Cyanosis, Clubbing or edema, No new Rash or bruise   PERTINENT RADIOLOGIC STUDIES: No results found.   PERTINENT LAB RESULTS: CBC: Recent Labs    04/28/21 0144  WBC 7.6  HGB 9.2*  HCT 28.4*  PLT 190   CMET CMP     Component Value Date/Time   NA 134 (L) 04/29/2021 0105   K 2.9 (L) 04/29/2021 0645   CL 105 04/29/2021 0105   CO2 19 (L) 04/29/2021 0105   GLUCOSE 109 (H) 04/29/2021 0105   BUN 13 04/29/2021 0105   CREATININE 7.04 (H) 04/29/2021 0105   CALCIUM 8.0 (L) 04/29/2021 0105   PROT 5.6 (L) 04/22/2021 2047   ALBUMIN 2.1 (L) 04/28/2021 0144   AST 31 04/22/2021 2047   ALT 19 04/22/2021 2047   ALKPHOS 66 04/22/2021 2047   BILITOT 0.9 04/22/2021 2047   GFRNONAA 6 (L) 04/29/2021 0105   GFRAA 5 (L) 08/21/2019 0222    GFR Estimated Creatinine Clearance: 4.8 mL/min (A) (by C-G formula based on SCr of 7.04 mg/dL (H)). No results for input(s): LIPASE, AMYLASE in the last 72 hours. No results for input(s): CKTOTAL, CKMB, CKMBINDEX, TROPONINI in the last 72 hours. Invalid input(s): POCBNP No results for input(s): DDIMER in the last 72 hours. No results for input(s): HGBA1C in the last 72 hours. No results for input(s): CHOL, HDL, LDLCALC, TRIG, CHOLHDL, LDLDIRECT in the last 72 hours. No results for input(s): TSH, T4TOTAL, T3FREE, THYROIDAB in the last 72 hours.  Invalid input(s):  FREET3 Recent Labs    04/29/21 0105  TIBC 127*  IRON 116   Coags: No results for input(s): INR in the last 72 hours.  Invalid input(s): PT Microbiology: Recent Results (from the past 240 hour(s))  Culture, blood (routine x 2)     Status: None  Collection Time: 04/22/21  9:25 PM   Specimen: BLOOD LEFT HAND  Result Value Ref Range Status   Specimen Description BLOOD LEFT HAND  Final   Special Requests   Final    BOTTLES DRAWN AEROBIC ONLY Blood Culture adequate volume   Culture   Final    NO GROWTH 5 DAYS Performed at [facility], [address], [place], [place] [address]    Report Status 04/27/2021 FINAL  Final  Resp Panel by RT-PCR (Flu A&B, Covid) Nasopharyngeal Swab     Status: None   Collection Time: 04/23/21 12:28 AM   Specimen: Nasopharyngeal Swab; Nasopharyngeal(NP) swabs in vial transport medium  Result Value Ref Range Status   SARS Coronavirus 2 by RT PCR NEGATIVE NEGATIVE Final    Comment: (NOTE) SARS-CoV-2 target nucleic acids are NOT DETECTED.  The SARS-CoV-2 RNA is generally detectable in upper respiratory specimens during the acute phase of infection. The lowest concentration of SARS-CoV-2 viral copies this assay can detect is 138 copies/mL. A negative result does not preclude SARS-Cov-2 infection and should not be used as the sole basis for treatment or other patient management decisions. A negative result may occur with  improper specimen collection/handling, submission of specimen other than nasopharyngeal swab, presence of viral mutation(s) within the areas targeted by this assay, and inadequate number of viral copies(<138 copies/mL). A negative result must be combined with clinical observations, patient history, and epidemiological information. The expected result is Negative.  Fact Sheet for Patients:  EntrepreneurPulse.com.au  Fact Sheet for Healthcare Providers:  IncredibleEmployment.be  This test is  no t yet approved or cleared by the [place] FDA and  has been authorized for detection and/or diagnosis of SARS-CoV-2 by FDA under an Emergency Use Authorization (EUA). This EUA will remain  in effect (meaning this test can be used) for the duration of the COVID-19 declaration under Section 564(b)(1) of the Act, 21 U.S.C.section 360bbb-3(b)(1), unless the authorization is terminated  or revoked sooner.       Influenza A by PCR NEGATIVE NEGATIVE Final   Influenza B by PCR NEGATIVE NEGATIVE Final    Comment: (NOTE) The Xpert Xpress SARS-CoV-2/FLU/RSV plus assay is intended as an aid in the diagnosis of influenza from Nasopharyngeal swab specimens and should not be used as a sole basis for treatment. Nasal washings and aspirates are unacceptable for Xpert Xpress SARS-CoV-2/FLU/RSV testing.  Fact Sheet for Patients: EntrepreneurPulse.com.au  Fact Sheet for Healthcare Providers: IncredibleEmployment.be  This test is not yet approved or cleared by the [place] FDA and has been authorized for detection and/or diagnosis of SARS-CoV-2 by FDA under an Emergency Use Authorization (EUA). This EUA will remain in effect (meaning this test can be used) for the duration of the COVID-19 declaration under Section 564(b)(1) of the Act, 21 U.S.C. section 360bbb-3(b)(1), unless the authorization is terminated or revoked.  Performed at [facility], [place] [address] [place], [place] [address]     FURTHER DISCHARGE INSTRUCTIONS:  Get Medicines reviewed and adjusted: Please take all your medications with you for your next visit with your Primary MD  Laboratory/radiological data: Please request your Primary MD to go over all hospital tests and procedure/radiological results at the follow up, please ask your Primary MD to get all Hospital records sent to his/her office.  In some cases, they will be blood work, cultures and biopsy results pending  at the time of your discharge. Please request that your primary care M.D. goes through all the records  of your hospital data and follows up on these results.  Also Note the following: If you experience worsening of your admission symptoms, develop shortness of breath, life threatening emergency, suicidal or homicidal thoughts you must seek medical attention immediately by calling 911 or calling your MD immediately  if symptoms less severe.  You must read complete instructions/literature along with all the possible adverse reactions/side effects for all the Medicines you take and that have been prescribed to you. Take any new Medicines after you have completely understood and accpet all the possible adverse reactions/side effects.   Do not drive when taking Pain medications or sleeping medications (Benzodaizepines)  Do not take more than prescribed Pain, Sleep and Anxiety Medications. It is not advisable to combine anxiety,sleep and pain medications without talking with your primary care practitioner  Special Instructions: If you have smoked or chewed Tobacco  in the last 2 yrs please stop smoking, stop any regular Alcohol  and or any Recreational drug use.  Wear Seat belts while driving.  Please note: You were cared for by a hospitalist during your hospital stay. Once you are discharged, your primary care physician will handle any further medical issues. Please note that NO REFILLS for any discharge medications will be authorized once you are discharged, as it is imperative that you return to your primary care physician (or establish a relationship with a primary care physician if you do not have one) for your post hospital discharge needs so that they can reassess your need for medications and monitor your lab values.  Total Time spent coordinating discharge including counseling, education and face to face time equals 35 minutes.  SignedOren Binet 04/29/2021 11:07 AM

## 2021-04-29 NOTE — Progress Notes (Addendum)
[facility] Progress Note   Subjective:    Patient seen and examined at bedside. Appears comfortable. Denies SOB and CP. Continues to tolerate PD overnight. She reports she is going home today.   Objective Vitals:   04/29/21 0800 04/29/21 0827 04/29/21 1216 04/29/21 1308  BP:  (!) 92/51 (!) 98/49 (!) 95/48  Pulse:  60 62   Resp:  14 17 15   Temp:  98.2 F (36.8 C) 98.5 F (36.9 C) 98.7 F (37.1 C)  TempSrc:  Oral Oral Oral  SpO2: 100% 100% 99%   Weight:    51.6 kg  Height:       Physical Exam General: Older woman; Alert; NAD Heart: Normal S1 and S2; No murmurs, gallops, or rubs Lungs: Clear throughout; No wheezing, rales, or rhonchi Abdomen: Soft and non-tender; active bowel sounds Extremities: No edema BLLE Dialysis Access: L PD catheter   Filed Weights   04/28/21 0500 04/29/21 0400 04/29/21 1308  Weight: 52 kg 51.7 kg 51.6 kg    Intake/Output Summary (Last 24 hours) at 04/29/2021 1313 Last data filed at 04/28/2021 1807 Gross per 24 hour  Intake 2171.57 ml  Output --  Net 2171.57 ml    Additional Objective Labs: Basic Metabolic Panel: Recent Labs  Lab 04/23/21 2049 04/24/21 0651 04/27/21 0123 04/28/21 0144 04/29/21 0105 04/29/21 0645  NA 128*   < > 134* 133* 134*  --   K 3.9   < > 2.8* 3.2* 2.7* 2.9*  CL 99   < > 104 106 105  --   CO2 19*   < > 18* 18* 19*  --   GLUCOSE 131*   < > 99 126* 109*  --   BUN 28*   < > 14 12 13   --   CREATININE 11.71*   < > 7.20* 6.77* 7.04*  --   CALCIUM 7.6*   < > 7.5* 7.5* 8.0*  --   PHOS 4.3  --   --  2.4* 2.4*  --    < > = values in this interval not displayed.   Liver Function Tests: Recent Labs  Lab 04/22/21 2047 04/28/21 0144  AST 31  --   ALT 19  --   ALKPHOS 66  --   BILITOT 0.9  --   PROT 5.6*  --   ALBUMIN 2.9* 2.1*   No results for input(s): LIPASE, AMYLASE in the last 168 hours. CBC: Recent Labs  Lab 04/22/21 2047 04/25/21 0512 04/26/21 0237 04/28/21 0144  WBC 5.9 7.3 9.2 7.6   NEUTROABS 4.7 6.1 7.7 6.4  HGB 11.2* 10.9* 10.5* 9.2*  HCT 34.5* 34.0* 31.8* 28.4*  MCV 99.1 98.6 96.7 95.0  PLT 254 249 268 190   Blood Culture    Component Value Date/Time   SDES BLOOD LEFT HAND 04/22/2021 2125   SPECREQUEST  04/22/2021 2125    BOTTLES DRAWN AEROBIC ONLY Blood Culture adequate volume   CULT  04/22/2021 2125    NO GROWTH 5 DAYS Performed at [facility] [address] [place], [place] [address]    REPTSTATUS 04/27/2021 FINAL 04/22/2021 2125    Cardiac Enzymes: No results for input(s): CKTOTAL, CKMB, CKMBINDEX, TROPONINI in the last 168 hours. CBG: No results for input(s): GLUCAP in the last 168 hours. Iron Studies:  Recent Labs    04/29/21 0105  IRON 116  TIBC 127*   Lab Results  Component Value Date   INR 1.36 07/20/2016   INR 0.96 03/09/2009  INR 1.0 11/04/2008   Studies/Results: No results found.  Medications:  dialysis solution 1.5% low-MG/low-CA      (feeding supplement) PROSource Plus  30 mL Oral TID BM   aspirin EC  81 mg Oral Daily   atorvastatin  20 mg Oral Daily   calcitRIOL  0.25 mcg Oral Daily   fludrocortisone  0.1 mg Oral Daily   gentamicin cream  1 application Topical Daily   heparin  5,000 Units Subcutaneous Q8H   midodrine  15 mg Oral TID WC   multivitamin  1 tablet Oral QHS   pantoprazole  40 mg Oral BID AC   senna-docusate  1 tablet Oral Daily    Dialysis Orders: CCPD 7x per week, 43kg , 6 exchanges  , fill 2500cc , last fill 2000, no daytime exchange, dwell time 1.5h   Assessment/Plan: Dysphagia/ N+V/ Diarrhea/ wt loss - FTT. 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 May be more mechanical from pressure of PD fluid on her stomach.Completed swallow evaluation 04/26/21. S/p upper EGD 04/27/21-espohageal diverticulum, impacted foods, and esophageal ulcers-GI evaluating.   Hypotension - acth stim test and TSH normal. Suspect mostly volume depletion. Cont IVF"s  at lower dose 75 cc/hr. Continue midodrine 15mg  tid-continue. ESRD - on PD for about 5 years. Tolerating well but informed by HD RN of UF 39 this AM. Per exam, patient continues to be comfortable and denies ABD pain. Continue home regimen at discharge. Hypokalemia - Been receiving K+ supplementation IV and PO. K+ was 3.2 but back down to 2.9-40meq PO X 1 given by primary. Will check K+ level and monitor trends at outpatient. Volume - looks euvolemic on exam , 2-3kg up by wts COPD Anemia ckd - Hgb trending down-now 9.2. Iron studies: Iron 116 and Tsat 91%. Will start ESA outpatient. MBD ckd - Ca in range, PO4 and Albumin low, not on binders, not eating much. Continue protein supplement. Disposition: Patient scheduled for discharge today. Okay from discharge from renal standpoint.   Tobie Poet, NP [facility] 04/29/2021,1:13 PM  LOS: 5 days    I have seen and examined this patient and agree with plan and assessment in the above note with renal recommendations/intervention highlighted.  Feeling better and plans to d/c to home today but needs to eat something before she goes.  Soft diet ordered. [place] John A Jemarion Roycroft,MD 04/29/2021 3:01 PM

## 2021-04-29 NOTE — TOC Transition Note (Signed)
Transition of Care [facility]) - CM/SW Discharge Note   Patient Details  Name: [name] MRN: [id number] Date of Birth: [birth year]  Transition of Care [facility]) CM/SW Contact:  Cyndi Bender, RN Phone Number: 04/29/2021, 2:16 PM   Clinical Narrative:    Patient stable for discharge. HH-PT/OT/Aide ordered. Stacy with [facility] notified of discharge today. Patient states she has transportation home.  Final next level of care: [place] Barriers to Discharge: Barriers Resolved   Patient Goals and CMS Choice Patient states their goals for this hospitalization and ongoing recovery are:: return home      Discharge Placement                       Discharge Plan and Services                DME Arranged: [place] rolling DME Agency: [employer] Date DME Agency Contacted: 04/29/21 Time DME Agency Contacted: [phone] Representative spoke with at DME Agency: Freda Munro HH Arranged: PT, OT, Nurse's Aide [place] Agency: [place] Date [place]: 04/29/21 Time [place]: 1350 Representative spoke with at [place]: [place] ([place]) Interventions     Readmission Risk Interventions Readmission Risk Prevention Plan 04/29/2021  Palliative Care Screening Not Applicable  Medication Review (RN Care Manager) Complete  Some recent data might be hidden

## 2021-04-29 NOTE — Progress Notes (Signed)
EGD 3 year recall has been set. Follow-up scheduled with Dr. [name] on 06/02/21 at 3:20 pm. Letter sent to pt's home address.

## 2021-05-16 ENCOUNTER — Observation Stay (HOSPITAL_COMMUNITY)
Admission: EM | Admit: 2021-05-16 | Discharge: 2021-05-18 | Disposition: A | Discharge status: Home / Self Care | Payer: Medicare HMO | Attending: Pulmonary Disease | Admitting: Pulmonary Disease

## 2021-05-16 ENCOUNTER — Encounter (HOSPITAL_COMMUNITY): Payer: Self-pay | Admitting: Emergency Medicine

## 2021-05-16 ENCOUNTER — Emergency Department (HOSPITAL_COMMUNITY): Payer: Medicare HMO

## 2021-05-16 DIAGNOSIS — R197 Diarrhea, unspecified: Secondary | ICD-10-CM | POA: Diagnosis present

## 2021-05-16 DIAGNOSIS — Z66 Do not resuscitate: Secondary | ICD-10-CM | POA: Insufficient documentation

## 2021-05-16 DIAGNOSIS — Z7189 Other specified counseling: Secondary | ICD-10-CM

## 2021-05-16 DIAGNOSIS — R55 Syncope and collapse: Secondary | ICD-10-CM | POA: Diagnosis not present

## 2021-05-16 DIAGNOSIS — Z87891 Personal history of nicotine dependence: Secondary | ICD-10-CM | POA: Insufficient documentation

## 2021-05-16 DIAGNOSIS — I120 Hypertensive chronic kidney disease with stage 5 chronic kidney disease or end stage renal disease: Secondary | ICD-10-CM | POA: Diagnosis not present

## 2021-05-16 DIAGNOSIS — I959 Hypotension, unspecified: Secondary | ICD-10-CM | POA: Diagnosis present

## 2021-05-16 DIAGNOSIS — A419 Sepsis, unspecified organism: Secondary | ICD-10-CM

## 2021-05-16 DIAGNOSIS — Z7982 Long term (current) use of aspirin: Secondary | ICD-10-CM | POA: Insufficient documentation

## 2021-05-16 DIAGNOSIS — N186 End stage renal disease: Secondary | ICD-10-CM | POA: Diagnosis not present

## 2021-05-16 DIAGNOSIS — K6389 Other specified diseases of intestine: Secondary | ICD-10-CM | POA: Diagnosis not present

## 2021-05-16 DIAGNOSIS — R009 Unspecified abnormalities of heart beat: Principal | ICD-10-CM | POA: Insufficient documentation

## 2021-05-16 DIAGNOSIS — D631 Anemia in chronic kidney disease: Secondary | ICD-10-CM | POA: Diagnosis not present

## 2021-05-16 DIAGNOSIS — E876 Hypokalemia: Secondary | ICD-10-CM | POA: Diagnosis present

## 2021-05-16 DIAGNOSIS — Z992 Dependence on renal dialysis: Secondary | ICD-10-CM | POA: Diagnosis not present

## 2021-05-16 DIAGNOSIS — Z79899 Other long term (current) drug therapy: Secondary | ICD-10-CM | POA: Diagnosis not present

## 2021-05-16 DIAGNOSIS — J449 Chronic obstructive pulmonary disease, unspecified: Secondary | ICD-10-CM | POA: Diagnosis not present

## 2021-05-16 DIAGNOSIS — Z20822 Contact with and (suspected) exposure to covid-19: Secondary | ICD-10-CM | POA: Insufficient documentation

## 2021-05-16 DIAGNOSIS — Z01818 Encounter for other preprocedural examination: Secondary | ICD-10-CM

## 2021-05-16 DIAGNOSIS — I469 Cardiac arrest, cause unspecified: Secondary | ICD-10-CM

## 2021-05-16 DIAGNOSIS — K668 Other specified disorders of peritoneum: Secondary | ICD-10-CM

## 2021-05-16 DIAGNOSIS — E872 Acidosis, unspecified: Secondary | ICD-10-CM

## 2021-05-16 DIAGNOSIS — N019 Rapidly progressive nephritic syndrome with unspecified morphologic changes: Secondary | ICD-10-CM | POA: Diagnosis present

## 2021-05-16 DIAGNOSIS — Z515 Encounter for palliative care: Secondary | ICD-10-CM | POA: Insufficient documentation

## 2021-05-16 DIAGNOSIS — R5383 Other fatigue: Secondary | ICD-10-CM

## 2021-05-16 DIAGNOSIS — Z91040 Latex allergy status: Secondary | ICD-10-CM | POA: Insufficient documentation

## 2021-05-16 DIAGNOSIS — R109 Unspecified abdominal pain: Secondary | ICD-10-CM | POA: Insufficient documentation

## 2021-05-16 DIAGNOSIS — I700 Atherosclerosis of aorta: Secondary | ICD-10-CM | POA: Insufficient documentation

## 2021-05-16 DIAGNOSIS — G9341 Metabolic encephalopathy: Secondary | ICD-10-CM

## 2021-05-16 DIAGNOSIS — R6521 Severe sepsis with septic shock: Secondary | ICD-10-CM

## 2021-05-16 DIAGNOSIS — R571 Hypovolemic shock: Secondary | ICD-10-CM

## 2021-05-16 DIAGNOSIS — E878 Other disorders of electrolyte and fluid balance, not elsewhere classified: Secondary | ICD-10-CM

## 2021-05-16 HISTORY — DX: End stage renal disease: N18.6

## 2021-05-16 HISTORY — DX: End stage renal disease: Z99.2

## 2021-05-16 MED ORDER — SODIUM CHLORIDE 0.9 % IV BOLUS
1000.0000 mL | Freq: Once | INTRAVENOUS | Status: AC
  Administered 2021-05-16: 1000 mL via INTRAVENOUS

## 2021-05-16 NOTE — ED Triage Notes (Addendum)
Pt bib EMS from home for lower abdominal pain that radiated to her epigastrum. Endorses diarrhea for the past 2 days without eating/drinking. Hypotensive in the 88T systolic, significant weight loss. Peritoneal dialysis daily at home, did not have this today. Pt given 155mL NS by EMS, 86/54 after fluids.   22G IV in R forearem CBG 141 96% room air  HR 90

## 2021-05-16 NOTE — ED Provider Notes (Signed)
[facility] EMERGENCY DEPARTMENT Provider Note   CSN: [id number] Arrival date & time: 05/16/21  2316     History  No chief complaint on file.   [name] is a 76 y.o. female.  The history is provided by the patient and medical records. No language interpreter was used.  Diarrhea Quality:  Watery Severity:  Moderate Onset quality:  Gradual Timing:  Constant Progression:  Unchanged Relieved by:  Nothing Worsened by:  Nothing Associated symptoms: abdominal pain   Associated symptoms: no chills, no recent cough, no diaphoresis, no fever, no headaches, no URI and no vomiting       Home Medications Prior to Admission medications   Medication Sig Start Date End Date Taking? Authorizing Provider  acetaminophen (TYLENOL) 500 MG tablet Take 1,000 mg by mouth every 6 (six) hours as needed (for headaches or pain).     [provider]  albuterol (VENTOLIN HFA) 108 (90 Base) MCG/ACT inhaler Inhale 1-2 puffs into the lungs every 6 (six) hours as needed for wheezing or shortness of breath. 12/16/19   Parrett, Fonnie Mu, NP  aspirin EC 81 MG tablet Take 81 mg by mouth daily.    [provider]  atorvastatin (LIPITOR) 20 MG tablet Take 20 mg by mouth daily. 02/24/21   [provider]  BREO ELLIPTA 100-25 MCG/INH AEPB Inhale 1 puff into the lungs daily as needed (shortness of breath). 06/30/19   [provider]  calcitRIOL (ROCALTROL) 0.25 MCG capsule Take 0.25 mcg by mouth daily.  03/14/19   [provider]  COLACE 100 MG capsule Take 100 mg by mouth daily as needed for mild constipation.  03/14/19   [provider]  cyclobenzaprine (FLEXERIL) 5 MG tablet Take 5 mg by mouth 3 (three) times daily as needed for muscle spasms. 01/15/21   [provider]  ergocalciferol (VITAMIN D2) 1.25 MG (50000 UT) capsule Take 50,000 Units by mouth every Sunday.    [provider]  ferric citrate (AURYXIA) 1 GM 210 MG(Fe)  tablet Take 210 mg by mouth 3 (three) times daily with meals.    [provider]  fludrocortisone (FLORINEF) 0.1 MG tablet Take 1 tablet (0.1 mg total) by mouth daily. 04/30/21   Ghimire, Henreitta Leber, MD  fluticasone (FLONASE) 50 MCG/ACT nasal spray Place 1 spray into both nostrils daily as needed for allergies or rhinitis. 01/15/21   [provider]  loratadine-pseudoephedrine (CLARITIN-D 12 HOUR) 5-120 MG tablet Take 1 tablet by mouth 2 (two) times daily as needed for allergies.    [provider]  Melatonin 5 MG TABS Take 5 mg by mouth at bedtime as needed (for sleep).     [provider]  midodrine (PROAMATINE) 10 MG tablet Take 1.5 tablets (15 mg total) by mouth 3 (three) times daily with meals. 04/29/21   Ghimire, Henreitta Leber, MD  multivitamin (RENA-VIT) TABS tablet Take 1 tablet by mouth daily.    [provider]  ondansetron (ZOFRAN-ODT) 4 MG disintegrating tablet Take 4 mg by mouth every 6 (six) hours as needed for nausea. 04/20/21   [provider]  pantoprazole (PROTONIX) 40 MG tablet Take 1 tablet (40 mg total) by mouth 2 (two) times daily. 04/29/21   Ghimire, Henreitta Leber, MD  Potassium Chloride ER 20 MEQ TBCR Take 20 mEq by mouth daily.  05/24/19   [provider]  sodium chloride (OCEAN) 0.65 % SOLN nasal spray Place 2 sprays into both nostrils daily as needed for  congestion.     [provider]  Sodium Chloride-Sodium Bicarb (NETI POT SINUS [place]) 2300-700 MG KIT Place 1 application into the nose 2 (two) times daily as needed. Patient taking differently: Place 1 application into the nose 2 (two) times daily as needed (congestion). 04/03/21   Isla Pence, MD      Allergies    Ace inhibitors, Calcium channel blockers, Pneumococcal vaccines, Tetanus toxoid, Latex, and Tape    Review of Systems   Review of Systems  Constitutional:  Positive for fatigue. Negative for chills, diaphoresis and fever.  HENT:  Negative for  congestion.   Respiratory:  Negative for cough, chest tightness, shortness of breath and wheezing.   Cardiovascular:  Negative for chest pain.  Gastrointestinal:  Positive for abdominal pain and diarrhea. Negative for constipation and vomiting.  Genitourinary:        Darkened urine  Musculoskeletal:  Negative for back pain and neck pain.  Skin:  Negative for rash and wound.  Neurological:  Positive for light-headedness. Negative for dizziness, syncope, numbness and headaches.  Psychiatric/Behavioral:  Negative for agitation and confusion.   All other systems reviewed and are negative.  Physical Exam Updated Vital Signs BP (!) 82/43    Pulse 87    Temp 98.7 F (37.1 C) (Oral)    Resp 16    Ht 5' (1.524 m)    SpO2 100%    BMI 22.22 kg/m  Physical Exam Vitals and nursing note reviewed.  Constitutional:      General: She is not in acute distress.    Appearance: She is well-developed. She is not ill-appearing, toxic-appearing or diaphoretic.  HENT:     Head: Normocephalic and atraumatic.     Nose: No congestion or rhinorrhea.     Mouth/Throat:     Mouth: Mucous membranes are dry.     Pharynx: No oropharyngeal exudate or posterior oropharyngeal erythema.  Eyes:     Conjunctiva/sclera: Conjunctivae normal.     Pupils: Pupils are equal, round, and reactive to light.  Cardiovascular:     Rate and Rhythm: Normal rate and regular rhythm.     Heart sounds: No murmur heard. Pulmonary:     Effort: Pulmonary effort is normal. No respiratory distress.     Breath sounds: Normal breath sounds. No wheezing, rhonchi or rales.  Chest:     Chest wall: No tenderness.  Abdominal:     General: Abdomen is flat.     Palpations: Abdomen is soft.     Tenderness: There is no abdominal tenderness. There is no right CVA tenderness, left CVA tenderness, guarding or rebound.  Musculoskeletal:        General: No swelling or tenderness.     Cervical back: Neck supple.     Right lower leg: No edema.      Left lower leg: No edema.  Skin:    General: Skin is warm and dry.     Capillary Refill: Capillary refill takes less than 2 seconds.     Findings: No erythema.  Neurological:     General: No focal deficit present.     Mental Status: She is alert.  Psychiatric:        Mood and Affect: Mood normal.    ED Results / Procedures / Treatments   Labs (all labs ordered are listed, but only abnormal results are displayed) Labs Reviewed  CBC WITH DIFFERENTIAL/PLATELET - Abnormal; Notable for the following components:      Result Value   WBC  19.1 (*)    RBC 3.72 (*)    Hemoglobin 11.7 (*)    HCT 34.0 (*)    Neutro Abs 16.8 (*)    Lymphs Abs 0.3 (*)    Monocytes Absolute 1.5 (*)    Abs Immature Granulocytes 0.24 (*)    All other components within normal limits  COMPREHENSIVE METABOLIC PANEL - Abnormal; Notable for the following components:   Potassium 2.8 (*)    CO2 20 (*)    Glucose, Bld 109 (*)    BUN 30 (*)    Creatinine, Ser 10.67 (*)    Calcium 6.7 (*)    Total Protein 4.8 (*)    Albumin 2.0 (*)    AST 98 (*)    Alkaline Phosphatase 147 (*)    GFR, Estimated 3 (*)    Anion gap 17 (*)    All other components within normal limits  LACTIC ACID, PLASMA - Abnormal; Notable for the following components:   Lactic Acid, Venous 3.3 (*)    All other components within normal limits  URINE CULTURE  RESP PANEL BY RT-PCR (FLU A&B, COVID) ARPGX2  LIPASE, BLOOD  LACTIC ACID, PLASMA  URINALYSIS, ROUTINE W REFLEX MICROSCOPIC  URINALYSIS, ROUTINE W REFLEX MICROSCOPIC    EKG EKG Interpretation  Date/Time:  Sunday May 16 2021 23:36:51 EST Ventricular Rate:  94 PR Interval:  152 QRS Duration: 117 QT Interval:  418 QTC Calculation: 523 R Axis:   -47 Text Interpretation: Sinus rhythm LAD, consider left anterior fascicular block Abnormal T, consider ischemia, diffuse leads When compared to prior, similar appearance with longer QTC. No STEMI Confirmed by Antony Blackbird [phone]) on  05/16/2021 11:52:05 PM  Radiology CT ABDOMEN PELVIS WO CONTRAST  Result Date: 05/17/2021 CLINICAL DATA:  Abdominal pain. EXAM: CT ABDOMEN AND PELVIS WITHOUT CONTRAST TECHNIQUE: Multidetector CT imaging of the abdomen and pelvis was performed following the standard protocol without IV contrast. COMPARISON:  CT dated 04/25/2021. FINDINGS: Evaluation of this exam is limited in the absence of intravenous contrast. Lower chest: Bibasilar scarring. There is no peritoneum of indeterminate etiology, possibly related to peritoneal dialysis catheter. Clinical correlation is recommended. Small free fluid in the pelvis. Hepatobiliary: The liver is unremarkable. There is sludge or small stones within the gallbladder. No pericholecystic fluid. Pancreas: No acute inflammatory changes. Spleen: Normal in size without focal abnormality. Adrenals/Urinary Tract: Moderate bilateral renal parenchyma atrophy. There is no hydronephrosis or nephrolithiasis on either side. The urinary bladder is collapsed. Stomach/Bowel: Mild diffuse thickened appearance of the colon, likely related to underdistention. Mild colitis is less likely. Clinical correlation is recommended. Scattered sigmoid diverticula without active inflammatory changes. There is no bowel obstruction. The appendix is not visualized with certainty. No inflammatory changes identified in the right lower quadrant. Vascular/Lymphatic: Mild aortoiliac atherosclerotic disease. The IVC is unremarkable. No portal venous gas. There is no adenopathy. Reproductive: Hysterectomy. Other: Peritoneal dialysis catheter with tip in the right hemipelvis. Musculoskeletal: Osteopenia with degenerative changes of the spine. No acute osseous pathology. IMPRESSION: 1. Pneumoperitoneum, possibly introduced via peritoneal dialysis catheter. Clinical correlation is recommended to exclude bowel perforation. 2. Mild diffuse thickened appearance of the colon, likely related to underdistention. Mild colitis  is less likely. No bowel obstruction. 3. Gallbladder sludge or small stones. 4. Aortic Atherosclerosis (ICD10-I70.0). Electronically Signed   By: Anner Crete M.D.   On: 05/17/2021 00:15   DG Chest Portable 1 View  Result Date: 05/17/2021 CLINICAL DATA:  Hypotension.  Rule out occult infection EXAM: PORTABLE CHEST 1  VIEW COMPARISON:  04/22/2021 FINDINGS: Areas of scarring in the lungs bilaterally. Postoperative changes in the left lung. Heart is normal size. No confluent airspace opacities or effusions. No acute bony abnormality. IMPRESSION: Postsurgical and chronic changes/scarring.  No active disease. Electronically Signed   By: Rolm Baptise M.D.   On: 05/17/2021 00:05    Procedures Procedures    CRITICAL CARE Performed by: Gwenyth Allegra Raidyn Wassink Total critical care time: 35 minutes Critical care time was exclusive of separately billable procedures and treating other patients. Critical care was necessary to treat or prevent imminent or life-threatening deterioration. Critical care was time spent personally by me on the following activities: development of treatment plan with patient and/or surrogate as well as nursing, discussions with consultants, evaluation of patient's response to treatment, examination of patient, obtaining history from patient or surrogate, ordering and performing treatments and interventions, ordering and review of laboratory studies, ordering and review of radiographic studies, pulse oximetry and re-evaluation of patient's condition.   Medications Ordered in ED Medications  fentaNYL (SUBLIMAZE) injection 25 mcg (0 mcg Intravenous Hold 05/17/21 0329)  potassium chloride SA (KLOR-CON M) CR tablet 40 mEq (has no administration in time range)  ceFEPIme (MAXIPIME) 2 g in sodium chloride 0.9 % 100 mL IVPB (has no administration in time range)  metroNIDAZOLE (FLAGYL) IVPB 500 mg (has no administration in time range)  vancomycin (VANCOCIN) IVPB 1000 mg/200 mL premix (has no  administration in time range)  fludrocortisone (FLORINEF) tablet 0.1 mg (has no administration in time range)  midodrine (PROAMATINE) tablet 15 mg (has no administration in time range)  multivitamin (RENA-VIT) tablet 1 tablet (has no administration in time range)  ferric citrate (AURYXIA) tablet 210 mg (has no administration in time range)  calcitRIOL (ROCALTROL) capsule 0.25 mcg (has no administration in time range)  fluticasone furoate-vilanterol (BREO ELLIPTA) 100-25 MCG/ACT 1 puff (has no administration in time range)  atorvastatin (LIPITOR) tablet 20 mg (has no administration in time range)  pantoprazole (PROTONIX) EC tablet 40 mg (has no administration in time range)  potassium chloride (KLOR-CON) packet 40 mEq (has no administration in time range)  albuterol (VENTOLIN HFA) 108 (90 Base) MCG/ACT inhaler 1-2 puff (has no administration in time range)  aspirin EC tablet 81 mg (has no administration in time range)  docusate sodium (COLACE) capsule 100 mg (has no administration in time range)  melatonin tablet 5 mg (has no administration in time range)  fluticasone (FLONASE) 50 MCG/ACT nasal spray 1 spray (has no administration in time range)  sodium chloride 0.9 % bolus 1,000 mL (0 mLs Intravenous Stopped 05/17/21 0200)  sodium chloride 0.9 % bolus 250 mL (0 mLs Intravenous Stopped 05/17/21 0310)  midodrine (PROAMATINE) tablet 15 mg (15 mg Oral Given 05/17/21 0350)    ED Course/ Medical Decision Making/ A&P                           Medical Decision Making  [name] is a 76 y.o. female with a past medical history significant for hypertension, hyperlipidemia, COPD, hiatal hernia, esophageal stricture status post recent dilation several weeks ago, ESRD with peritoneal dialysis, and previous peritonitis who presents with 2 days of diarrhea, abdominal discomfort, near syncope, lightheadedness, and hypotension.  According to patient, she takes peritoneal dialysis daily and currently has  dialysate in her that she placed about 9:30 PM.  She reports it will come out in the morning.  She says that it looked clear and  did not appear infected when it was exchanged last.  She reports no nausea or vomiting but has had diarrhea for the last 2 days as well as discomfort.  She reports the pain was 10 out of 10 but does not 8 out of 10 in severity.  According to EMS, patient was hypotensive in the 97L systolic and after some fluids is into the 80s.  She reports she has not been eating or drinking because every time she tries to get up or stand up she gets near syncopal and nearly passes out.  She reports that her urine has been looking darker and looked infected the last time she was able to urinate.  She denies any fevers, chills, ingestion, cough.  She denies chest pain or shortness of breath.  She denies any trauma.  She reports her voice sounds slightly different since her previous procedure but reports this has not changed recently.  On exam, abdomen is nontender.  Bowel sounds are appreciated.  Lungs are clear and there is no murmur appreciated.  No tenderness on exam.  Initial blood pressure was around 100 when she was lying still but when she sat up for lung auscultation blood pressure promptly dropped to 70 systolic.  Patient was symptomatic and near syncopal and lightheaded.  Patient was laid back.  Clinically I am concerned that the patient's decreased oral intake and diarrhea may have contributed to this dehydration and hypotension however with her urinary symptoms and the discomfort, will get work-up to further evaluate.  Although her abdomen is nontender, with her peritoneal dialysis catheter in the abdomen, will get CT imaging without contrast to further evaluate.  We will get chest x-ray and urinalysis.  We will get labs.  Anticipate reassessment after work-up but given the symptomatic hypotension anticipate she will need admission.   12:53 AM While waiting for work-up results, I spoke  with nephrology and Dr. Joelyn Oms did agree that patient is likely having more complex wound to thrive picture and will need admission as she may end up needing to transition to hemodialysis.  He reviewed the images of the CT abdomen pelvis and suspect the pneumoperitoneum is from the dialysis.  He also has low suspicion for SBP based on the patient's story and feels that during her admission she may need some analysis of the dialysate tomorrow but does not need it tonight.  He agrees with some fluids given the diarrhea and decreased oral intake with hypotension and lab testing.  He agrees with medicine admission after work-up is completed.  3:25 AM Work-up continue to return.  Patient does have elevated lactic acid at 3.3, will trend.  CBC also elevated at 19.1.  Metabolic panel was abnormal as predicted by nephrology with a degree of hypokalemia and elevated creatinine.  Chest x-ray did not show pneumonia lipase not elevated.[name]  COVID and flu testing ordered.  Patient says that her darkened urine that felt "different" it seemed similar to when she has had UTIs in the past but denies dysuria.  She still does not think she has a bacterial peritonitis given the appearance of her dialysate earlier and lack of abdominal tenderness.   Patient also reports she did not take any of her medicines today including the midodrine due to how she was feeling in the lightheadedness.  Suspect this is why her blood pressure has been softer here.  Of note, patient was only able to make a small amount of urine initially which the lab was only able to  run a culture on and not a UA.  We will try to get another urine sample for UA.  Due to her low blood pressures, concern for failure to thrive, and was concern for possible UTI, will call for admission.  Nephrology feels it is appropriate to wait till morning for the dialysate testing so we will speak with admitting team about if it is worth giving antibiotics now for possible UTI  before urinalysis has been analyzed and before dialysate testing has been performed or wait till morning.  Patient will be admitted.        Final Clinical Impression(s) / ED Diagnoses Final diagnoses:  Near syncope  Diarrhea, unspecified type  Other fatigue  Abdominal pain, unspecified abdominal location     Clinical Impression: 1. Near syncope   2. Diarrhea, unspecified type   3. Other fatigue   4. Abdominal pain, unspecified abdominal location     Disposition: Admit  This note was prepared with assistance of Dragon voice recognition software. Occasional wrong-word or sound-a-like substitutions may have occurred due to the inherent limitations of voice recognition software.     Caydan Mctavish, Gwenyth Allegra, MD 05/17/21 [phone]

## 2021-05-17 ENCOUNTER — Encounter (HOSPITAL_COMMUNITY): Payer: Self-pay | Admitting: Internal Medicine

## 2021-05-17 ENCOUNTER — Other Ambulatory Visit: Payer: Self-pay

## 2021-05-17 ENCOUNTER — Emergency Department (HOSPITAL_COMMUNITY): Payer: Medicare HMO

## 2021-05-17 ENCOUNTER — Observation Stay (HOSPITAL_COMMUNITY): Payer: Medicare HMO

## 2021-05-17 DIAGNOSIS — Z992 Dependence on renal dialysis: Secondary | ICD-10-CM

## 2021-05-17 DIAGNOSIS — R109 Unspecified abdominal pain: Secondary | ICD-10-CM | POA: Diagnosis not present

## 2021-05-17 DIAGNOSIS — R55 Syncope and collapse: Secondary | ICD-10-CM

## 2021-05-17 DIAGNOSIS — A419 Sepsis, unspecified organism: Secondary | ICD-10-CM

## 2021-05-17 DIAGNOSIS — R197 Diarrhea, unspecified: Secondary | ICD-10-CM | POA: Diagnosis present

## 2021-05-17 DIAGNOSIS — E876 Hypokalemia: Secondary | ICD-10-CM

## 2021-05-17 DIAGNOSIS — I959 Hypotension, unspecified: Secondary | ICD-10-CM

## 2021-05-17 DIAGNOSIS — N186 End stage renal disease: Secondary | ICD-10-CM

## 2021-05-17 LAB — POCT I-STAT 7, (LYTES, BLD GAS, ICA,H+H)
Acid-base deficit: 25 mmol/L — ABNORMAL HIGH (ref 0.0–2.0)
Bicarbonate: 7.2 mmol/L — ABNORMAL LOW (ref 20.0–28.0)
Calcium, Ion: 1.13 mmol/L — ABNORMAL LOW (ref 1.15–1.40)
HCT: 32 % — ABNORMAL LOW (ref 36.0–46.0)
Hemoglobin: 10.9 g/dL — ABNORMAL LOW (ref 12.0–15.0)
O2 Saturation: 100 %
Patient temperature: 34.3
Potassium: 3.2 mmol/L — ABNORMAL LOW (ref 3.5–5.1)
Sodium: 132 mmol/L — ABNORMAL LOW (ref 135–145)
TCO2: 8 mmol/L — ABNORMAL LOW (ref 22–32)
pCO2 arterial: 31.8 mmHg — ABNORMAL LOW (ref 32.0–48.0)
pH, Arterial: 6.942 — CL (ref 7.350–7.450)
pO2, Arterial: 396 mmHg — ABNORMAL HIGH (ref 83.0–108.0)

## 2021-05-17 LAB — RESP PANEL BY RT-PCR (FLU A&B, COVID) ARPGX2
Influenza A by PCR: NEGATIVE
Influenza B by PCR: NEGATIVE
SARS Coronavirus 2 by RT PCR: NEGATIVE

## 2021-05-17 LAB — COMPREHENSIVE METABOLIC PANEL
ALT: 41 U/L (ref 0–44)
AST: 98 U/L — ABNORMAL HIGH (ref 15–41)
Albumin: 2 g/dL — ABNORMAL LOW (ref 3.5–5.0)
Alkaline Phosphatase: 147 U/L — ABNORMAL HIGH (ref 38–126)
Anion gap: 17 — ABNORMAL HIGH (ref 5–15)
BUN: 30 mg/dL — ABNORMAL HIGH (ref 8–23)
CO2: 20 mmol/L — ABNORMAL LOW (ref 22–32)
Calcium: 6.7 mg/dL — ABNORMAL LOW (ref 8.9–10.3)
Chloride: 102 mmol/L (ref 98–111)
Creatinine, Ser: 10.67 mg/dL — ABNORMAL HIGH (ref 0.44–1.00)
GFR, Estimated: 3 mL/min — ABNORMAL LOW (ref >60)
Glucose, Bld: 109 mg/dL — ABNORMAL HIGH (ref 70–99)
Potassium: 2.8 mmol/L — ABNORMAL LOW (ref 3.5–5.1)
Sodium: 139 mmol/L (ref 135–145)
Total Bilirubin: 0.7 mg/dL (ref 0.3–1.2)
Total Protein: 4.8 g/dL — ABNORMAL LOW (ref 6.5–8.1)

## 2021-05-17 LAB — CBC WITH DIFFERENTIAL/PLATELET
Abs Immature Granulocytes: 0.24 10*3/uL — ABNORMAL HIGH (ref 0.00–0.07)
Basophils Absolute: 0.1 10*3/uL (ref 0.0–0.1)
Basophils Relative: 0 %
Eosinophils Absolute: 0.2 10*3/uL (ref 0.0–0.5)
Eosinophils Relative: 1 %
HCT: 34 % — ABNORMAL LOW (ref 36.0–46.0)
Hemoglobin: 11.7 g/dL — ABNORMAL LOW (ref 12.0–15.0)
Immature Granulocytes: 1 %
Lymphocytes Relative: 2 %
Lymphs Abs: 0.3 10*3/uL — ABNORMAL LOW (ref 0.7–4.0)
MCH: 31.5 pg (ref 26.0–34.0)
MCHC: 34.4 g/dL (ref 30.0–36.0)
MCV: 91.4 fL (ref 80.0–100.0)
Monocytes Absolute: 1.5 10*3/uL — ABNORMAL HIGH (ref 0.1–1.0)
Monocytes Relative: 8 %
Neutro Abs: 16.8 10*3/uL — ABNORMAL HIGH (ref 1.7–7.7)
Neutrophils Relative %: 88 %
Platelets: 230 10*3/uL (ref 150–400)
RBC: 3.72 MIL/uL — ABNORMAL LOW (ref 3.87–5.11)
RDW: 14.9 % (ref 11.5–15.5)
WBC: 19.1 10*3/uL — ABNORMAL HIGH (ref 4.0–10.5)
nRBC: 0.2 % (ref 0.0–0.2)

## 2021-05-17 LAB — RENAL FUNCTION PANEL
Albumin: 2.1 g/dL — ABNORMAL LOW (ref 3.5–5.0)
Anion gap: 20 — ABNORMAL HIGH (ref 5–15)
BUN: 36 mg/dL — ABNORMAL HIGH (ref 8–23)
CO2: 18 mmol/L — ABNORMAL LOW (ref 22–32)
Calcium: 6.8 mg/dL — ABNORMAL LOW (ref 8.9–10.3)
Chloride: 99 mmol/L (ref 98–111)
Creatinine, Ser: 10.34 mg/dL — ABNORMAL HIGH (ref 0.44–1.00)
GFR, Estimated: 4 mL/min — ABNORMAL LOW (ref >60)
Glucose, Bld: 112 mg/dL — ABNORMAL HIGH (ref 70–99)
Phosphorus: 6.1 mg/dL — ABNORMAL HIGH (ref 2.5–4.6)
Potassium: 2.7 mmol/L — CL (ref 3.5–5.1)
Sodium: 137 mmol/L (ref 135–145)

## 2021-05-17 LAB — BODY FLUID CELL COUNT WITH DIFFERENTIAL
Eos, Fluid: 0 %
Lymphs, Fluid: 2 %
Monocyte-Macrophage-Serous Fluid: 41 % — ABNORMAL LOW (ref 50–90)
Neutrophil Count, Fluid: 57 % — ABNORMAL HIGH (ref 0–25)
Total Nucleated Cell Count, Fluid: 64 cu mm (ref 0–1000)

## 2021-05-17 LAB — GLUCOSE, CAPILLARY: Glucose-Capillary: 49 mg/dL — ABNORMAL LOW (ref 70–99)

## 2021-05-17 LAB — C DIFFICILE QUICK SCREEN W PCR REFLEX
C Diff antigen: NEGATIVE
C Diff interpretation: NOT DETECTED
C Diff toxin: NEGATIVE

## 2021-05-17 LAB — CBC
HCT: 33 % — ABNORMAL LOW (ref 36.0–46.0)
Hemoglobin: 11.6 g/dL — ABNORMAL LOW (ref 12.0–15.0)
MCH: 31.4 pg (ref 26.0–34.0)
MCHC: 35.2 g/dL (ref 30.0–36.0)
MCV: 89.4 fL (ref 80.0–100.0)
Platelets: 259 10*3/uL (ref 150–400)
RBC: 3.69 MIL/uL — ABNORMAL LOW (ref 3.87–5.11)
RDW: 15 % (ref 11.5–15.5)
WBC: 21.4 10*3/uL — ABNORMAL HIGH (ref 4.0–10.5)
nRBC: 0.1 % (ref 0.0–0.2)

## 2021-05-17 LAB — LIPASE, BLOOD: Lipase: 31 U/L (ref 11–51)

## 2021-05-17 LAB — MAGNESIUM: Magnesium: 1.5 mg/dL — ABNORMAL LOW (ref 1.7–2.4)

## 2021-05-17 LAB — LACTIC ACID, PLASMA
Lactic Acid, Venous: 2.4 mmol/L (ref 0.5–1.9)
Lactic Acid, Venous: 3.3 mmol/L (ref 0.5–1.9)

## 2021-05-17 LAB — MRSA NEXT GEN BY PCR, NASAL: MRSA by PCR Next Gen: NOT DETECTED

## 2021-05-17 MED ORDER — RENA-VITE PO TABS
1.0000 | ORAL_TABLET | Freq: Every day | ORAL | Status: DC
  Administered 2021-05-17: 1 via ORAL
  Filled 2021-05-17: qty 1

## 2021-05-17 MED ORDER — DOCUSATE SODIUM 50 MG/5ML PO LIQD: 100.0000 mg | Freq: Two times a day (BID) | ORAL | Status: DC

## 2021-05-17 MED ORDER — ACETAMINOPHEN 650 MG RE SUPP: 650.0000 mg | Freq: Four times a day (QID) | RECTAL | Status: DC | PRN

## 2021-05-17 MED ORDER — METRONIDAZOLE 500 MG/100ML IV SOLN
500.0000 mg | Freq: Once | INTRAVENOUS | Status: AC
  Administered 2021-05-17: 500 mg via INTRAVENOUS
  Filled 2021-05-17: qty 100

## 2021-05-17 MED ORDER — FENTANYL CITRATE PF 50 MCG/ML IJ SOSY: 25.0000 ug | PREFILLED_SYRINGE | INTRAMUSCULAR | Status: DC | PRN

## 2021-05-17 MED ORDER — FENTANYL CITRATE PF 50 MCG/ML IJ SOSY
25.0000 ug | PREFILLED_SYRINGE | Freq: Once | INTRAMUSCULAR | Status: AC
  Administered 2021-05-17: 25 ug via INTRAVENOUS
  Filled 2021-05-17: qty 1

## 2021-05-17 MED ORDER — ACETAMINOPHEN 325 MG PO TABS
650.0000 mg | ORAL_TABLET | Freq: Four times a day (QID) | ORAL | Status: DC | PRN
  Administered 2021-05-17 (×2): 650 mg via ORAL
  Filled 2021-05-17 (×2): qty 2

## 2021-05-17 MED ORDER — FLUTICASONE PROPIONATE 50 MCG/ACT NA SUSP: 1.0000 | Freq: Every day | NASAL | Status: DC | PRN

## 2021-05-17 MED ORDER — NOREPINEPHRINE 4 MG/250ML-% IV SOLN
0.0000 ug/min | INTRAVENOUS | Status: DC
  Administered 2021-05-17: 10 ug/min via INTRAVENOUS
  Filled 2021-05-17: qty 250

## 2021-05-17 MED ORDER — VANCOMYCIN VARIABLE DOSE PER UNSTABLE RENAL FUNCTION (PHARMACIST DOSING): Status: DC

## 2021-05-17 MED ORDER — FLUDROCORTISONE ACETATE 0.1 MG PO TABS
0.1000 mg | ORAL_TABLET | Freq: Every day | ORAL | Status: DC
  Administered 2021-05-17: 0.1 mg via ORAL
  Filled 2021-05-17: qty 1

## 2021-05-17 MED ORDER — POTASSIUM CHLORIDE CRYS ER 20 MEQ PO TBCR
40.0000 meq | EXTENDED_RELEASE_TABLET | Freq: Once | ORAL | Status: AC
  Administered 2021-05-17: 20 meq via ORAL
  Filled 2021-05-17: qty 2

## 2021-05-17 MED ORDER — FENTANYL CITRATE (PF) 100 MCG/2ML IJ SOLN
INTRAMUSCULAR | Status: AC
  Administered 2021-05-17: 25 ug via INTRAVENOUS
  Filled 2021-05-17: qty 2

## 2021-05-17 MED ORDER — METRONIDAZOLE 500 MG/100ML IV SOLN
500.0000 mg | Freq: Two times a day (BID) | INTRAVENOUS | Status: DC
  Administered 2021-05-17: 500 mg via INTRAVENOUS
  Filled 2021-05-17: qty 100

## 2021-05-17 MED ORDER — METRONIDAZOLE 500 MG/100ML IV SOLN: 500.0000 mg | Freq: Once | INTRAVENOUS | Status: DC

## 2021-05-17 MED ORDER — LACTATED RINGERS IV BOLUS
250.0000 mL | Freq: Once | INTRAVENOUS | Status: AC
  Administered 2021-05-17: 250 mL via INTRAVENOUS

## 2021-05-17 MED ORDER — MELATONIN 5 MG PO TABS: 5.0000 mg | ORAL_TABLET | Freq: Every evening | ORAL | Status: DC | PRN

## 2021-05-17 MED ORDER — POTASSIUM CHLORIDE 20 MEQ PO PACK
20.0000 meq | PACK | Freq: Once | ORAL | Status: AC
  Administered 2021-05-17: 20 meq via ORAL
  Filled 2021-05-17: qty 1

## 2021-05-17 MED ORDER — SODIUM BICARBONATE 8.4 % IV SOLN: 100.0000 meq | Freq: Once | INTRAVENOUS | Status: AC

## 2021-05-17 MED ORDER — MIDODRINE HCL 5 MG PO TABS
15.0000 mg | ORAL_TABLET | Freq: Three times a day (TID) | ORAL | Status: DC
  Administered 2021-05-17 (×2): 15 mg via ORAL
  Filled 2021-05-17 (×2): qty 3

## 2021-05-17 MED ORDER — FERRIC CITRATE 1 GM 210 MG(FE) PO TABS
210.0000 mg | ORAL_TABLET | Freq: Three times a day (TID) | ORAL | Status: DC
  Administered 2021-05-17 (×3): 210 mg via ORAL
  Filled 2021-05-17 (×4): qty 1

## 2021-05-17 MED ORDER — DEXTROSE 50 % IV SOLN
INTRAVENOUS | Status: AC
  Filled 2021-05-17: qty 50

## 2021-05-17 MED ORDER — GENTAMICIN SULFATE 0.1 % EX CREA
1.0000 "application " | TOPICAL_CREAM | Freq: Every day | CUTANEOUS | Status: DC
  Filled 2021-05-17 (×2): qty 15

## 2021-05-17 MED ORDER — SODIUM BICARBONATE 8.4 % IV SOLN
INTRAVENOUS | Status: AC
  Administered 2021-05-17: 100 meq via INTRAVENOUS
  Filled 2021-05-17: qty 100

## 2021-05-17 MED ORDER — CALCITRIOL 0.25 MCG PO CAPS
0.2500 ug | ORAL_CAPSULE | Freq: Every day | ORAL | Status: DC
  Administered 2021-05-17: 0.25 ug via ORAL
  Filled 2021-05-17: qty 1

## 2021-05-17 MED ORDER — DOCUSATE SODIUM 100 MG PO CAPS: 100.0000 mg | ORAL_CAPSULE | Freq: Every day | ORAL | Status: DC | PRN

## 2021-05-17 MED ORDER — HEPARIN SODIUM (PORCINE) 5000 UNIT/ML IJ SOLN
5000.0000 [IU] | Freq: Three times a day (TID) | INTRAMUSCULAR | Status: DC
  Administered 2021-05-17 (×3): 5000 [IU] via SUBCUTANEOUS
  Filled 2021-05-17 (×3): qty 1

## 2021-05-17 MED ORDER — ALBUTEROL SULFATE (2.5 MG/3ML) 0.083% IN NEBU: 2.5000 mg | INHALATION_SOLUTION | Freq: Four times a day (QID) | RESPIRATORY_TRACT | Status: DC | PRN

## 2021-05-17 MED ORDER — SODIUM CHLORIDE 0.9 % IV SOLN
2.0000 g | Freq: Once | INTRAVENOUS | Status: AC
  Administered 2021-05-17: 2 g via INTRAVENOUS
  Filled 2021-05-17: qty 2

## 2021-05-17 MED ORDER — ONDANSETRON HCL 4 MG/2ML IJ SOLN
4.0000 mg | Freq: Four times a day (QID) | INTRAMUSCULAR | Status: DC | PRN
  Administered 2021-05-17 (×3): 4 mg via INTRAVENOUS
  Filled 2021-05-17 (×3): qty 2

## 2021-05-17 MED ORDER — MIDODRINE HCL 5 MG PO TABS
15.0000 mg | ORAL_TABLET | Freq: Once | ORAL | Status: AC
  Administered 2021-05-17: 15 mg via ORAL
  Filled 2021-05-17: qty 3

## 2021-05-17 MED ORDER — ONDANSETRON HCL 4 MG PO TABS: 4.0000 mg | ORAL_TABLET | Freq: Four times a day (QID) | ORAL | Status: DC | PRN

## 2021-05-17 MED ORDER — ASPIRIN EC 81 MG PO TBEC
81.0000 mg | DELAYED_RELEASE_TABLET | Freq: Every day | ORAL | Status: DC
  Administered 2021-05-17: 81 mg via ORAL
  Filled 2021-05-17: qty 1

## 2021-05-17 MED ORDER — SODIUM CHLORIDE 0.9 % IV SOLN
1.0000 g | INTRAVENOUS | Status: DC
  Filled 2021-05-17: qty 1

## 2021-05-17 MED ORDER — FENTANYL CITRATE PF 50 MCG/ML IJ SOSY
25.0000 ug | PREFILLED_SYRINGE | Freq: Four times a day (QID) | INTRAMUSCULAR | Status: DC | PRN
  Administered 2021-05-17 (×3): 25 ug via INTRAVENOUS
  Filled 2021-05-17 (×3): qty 1

## 2021-05-17 MED ORDER — POTASSIUM CHLORIDE 20 MEQ PO PACK
40.0000 meq | PACK | Freq: Every day | ORAL | Status: DC
  Administered 2021-05-17: 40 meq via ORAL
  Filled 2021-05-17: qty 2

## 2021-05-17 MED ORDER — POLYETHYLENE GLYCOL 3350 17 G PO PACK: 17.0000 g | PACK | Freq: Every day | ORAL | Status: DC

## 2021-05-17 MED ORDER — SODIUM CHLORIDE 0.9 % IV BOLUS
250.0000 mL | Freq: Once | INTRAVENOUS | Status: AC
  Administered 2021-05-17: 250 mL via INTRAVENOUS

## 2021-05-17 MED ORDER — HEPARIN 1000 UNIT/ML FOR PERITONEAL DIALYSIS: 500.0000 [IU] | INTRAMUSCULAR | Status: DC | PRN

## 2021-05-17 MED ORDER — DELFLEX-LC/1.5% DEXTROSE 344 MOSM/L IP SOLN: INTRAPERITONEAL | Status: DC

## 2021-05-17 MED ORDER — FLUTICASONE FUROATE-VILANTEROL 100-25 MCG/ACT IN AEPB: 1.0000 | INHALATION_SPRAY | Freq: Every day | RESPIRATORY_TRACT | Status: DC | PRN

## 2021-05-17 MED ORDER — HEPARIN 1000 UNIT/ML FOR PERITONEAL DIALYSIS
INTRAPERITONEAL | Status: DC | PRN
  Filled 2021-05-17: qty 3000

## 2021-05-17 MED ORDER — PANTOPRAZOLE SODIUM 40 MG PO TBEC
40.0000 mg | DELAYED_RELEASE_TABLET | Freq: Two times a day (BID) | ORAL | Status: DC
  Administered 2021-05-17 (×2): 40 mg via ORAL
  Filled 2021-05-17 (×2): qty 1

## 2021-05-17 MED ORDER — ATORVASTATIN CALCIUM 10 MG PO TABS
20.0000 mg | ORAL_TABLET | Freq: Every day | ORAL | Status: DC
  Administered 2021-05-17: 20 mg via ORAL
  Filled 2021-05-17: qty 2

## 2021-05-17 MED ORDER — POTASSIUM CHLORIDE 20 MEQ PO PACK
40.0000 meq | PACK | Freq: Once | ORAL | Status: AC
  Administered 2021-05-17: 40 meq via ORAL
  Filled 2021-05-17: qty 2

## 2021-05-17 MED ORDER — VANCOMYCIN HCL IN DEXTROSE 1-5 GM/200ML-% IV SOLN
1000.0000 mg | Freq: Once | INTRAVENOUS | Status: AC
  Administered 2021-05-17: 1000 mg via INTRAVENOUS
  Filled 2021-05-17 (×2): qty 200

## 2021-05-17 NOTE — Consult Note (Signed)
Renal Service Consult Note [facility]  [name] 05/17/2021 [name] Blazing, [name] Requesting Physician: [name], [name].   Reason for Consult: ESRD pt on PD w/ abd pain HPI: The patient is a 76 y.o. year-old w/ hx of COPD, ESRD on PD, anemia, HTN, HL who presented to ED on 1/09 earlier today w/ c/o abd pain and diarrhea for the last 2-3 days.  PD fluid has been clear, last PD was Sat night. No vomiting, no fevers.  In ED BP's were low in the 70s-80s and pt rec'd a fluid bolus of 1 1/4 liters.  BP's improved to the 90s. WBC was 19K. K 2.8.  CT AP showed small amt of pelvic fluid, mild amt of free air also prob due to PD procedure. Pt rec'd IV abx w/ vanc/ maxipime / flagyl.  We are asked to see for ESRD.    Pt seen in ED.  Pt lives at home w/ her daughter. Recently admitted for N/V and low BP's.  [name] home w/ home PT. Was getting better. But last 2 days developed cramping abd pain and diarrhea.  The pain is only w/ the diarrhea, otherwise no pain. No cloudy fluid.  No pain w/ intake or output. Pt is f/b [name]. No sob or CP.     ROS - denies CP, no joint pain, no HA, no blurry vision, no rash, no dysuria, no difficulty voiding   Past Medical History  Past Medical History:  Diagnosis Date   Acute kidney injury ([place]) 07/20/2016   Allergic rhinitis    Anxiety    CAP (community acquired pneumonia) 06/2016   [name] 06/19/2016   Cervical disc disease    /notes 07/20/2016   Chest pain at rest 06/18/2012   COPD (chronic obstructive pulmonary disease) ([place]) 05/23/2012   Diverticulosis    DJD (degenerative joint disease)    Esophageal diverticulum    Esophageal stricture    ESRD (end stage renal disease) (HCC)    GERD (gastroesophageal reflux disease)    Hiatal hernia    History of blood transfusion 06/2016   "when I was hospitalized w/sepsis"   Hyperlipidemia 05/23/2012   T. Chol 234, LDL 130    Hypertension    IBS (irritable bowel syndrome)    Lung nodules     hx  with extensive workup including lung biopsy ruling out Sjogren's disease/notes 07/20/2016   Osteopenia    Recurrent sinusitis    /noters 07/20/2016   Renal insufficiency    Sinus headache    "daily lately" (07/20/2016)   Traumatic arthritis    Past Surgical History  Past Surgical History:  Procedure Laterality Date   ANTERIOR CERVICAL DECOMP/DISCECTOMY FUSION  2005   BACK SURGERY     BIOPSY  04/27/2021   Procedure: BIOPSY;  Surgeon: [name];  Location: [name];  Service: Gastroenterology;;   COLONOSCOPY  2001   [name] 09/21/2010   CYSTECTOMY Bilateral    hx/notes 09/21/2010   ESOPHAGOGASTRODUODENOSCOPY N/A 04/27/2021   Procedure: ESOPHAGOGASTRODUODENOSCOPY (EGD);  Surgeon: [name];  Location: [facility];  Service: Gastroenterology;  Laterality: N/A;   FOREIGN BODY REMOVAL  04/27/2021   Procedure: FOREIGN BODY REMOVAL;  Surgeon: [name];  Location: [place];  Service: Gastroenterology;;   IR GENERIC HISTORICAL  07/20/2016   IR FLUORO GUIDE CV LINE RIGHT 07/20/2016 [name] [name]   IR GENERIC HISTORICAL  07/20/2016   IR US GUIDE VASC ACCESS RIGHT 07/20/2016  [name] [name]   LUNG BIOPSY Left    left side, not cancerous, thoracotomy   SAVORY DILATION N/A 04/27/2021   Procedure: SAVORY DILATION;  Surgeon: [name];  Location: [facility];  Service: Gastroenterology;  Laterality: N/A;   TONSILLECTOMY AND ADENOIDECTOMY     hx/notes 09/21/2010   VAGINAL HYSTERECTOMY     hx w/oophorectomy/notes 09/21/2010   Family History  Family History  Problem Relation Age of Onset   Throat cancer Father    Hypertension Sister    Thyroid disease Daughter    Hypertension Sister        x 2   Breast cancer Maternal Aunt    Blindness Sister        legally blind   Colon cancer Neg Hx    Stomach cancer Neg Hx    Social History  reports that she quit smoking about 36 years ago. Her  smoking use included cigarettes. She has a 0.96 pack-year smoking history. She has never used smokeless tobacco. She reports current alcohol use of about 1.0 standard drink per week. She reports that she does not use drugs. Allergies  Allergies  Allergen Reactions   Ace Inhibitors Anaphylaxis, Swelling and Other (See Comments)    Angioedema   Calcium Channel Blockers Anaphylaxis, Itching, Rash and Other (See Comments)    Red rash, Knot (sore and itches), and a fever for a couple days (per pt report)   Pneumococcal Vaccines Other (See Comments)    Red rash, Knot (sore and itches), A fever for a couple days (per pt report)   Tetanus Toxoid Swelling and Other (See Comments)    FEVER and cellulitis in an arm     Latex Rash    Reaction to gloves   Tape Rash and Other (See Comments)    PLEASE USE PAPER TAPE!!   Home medications Prior to Admission medications   Medication Sig Start Date End Date Taking? Authorizing Provider  acetaminophen (TYLENOL) 500 MG tablet Take 1,000 mg by mouth every 6 (six) hours as needed (for headaches or pain).    Yes Provider, Historical, [name]  albuterol (VENTOLIN HFA) 108 (90 Base) MCG/ACT inhaler Inhale 1-2 puffs into the lungs every 6 (six) hours as needed for wheezing or shortness of breath. 12/16/19  Yes [name], [name] S, NP  aspirin EC 81 MG tablet Take 81 mg by mouth daily.   Yes Provider, Historical, [name]  atorvastatin (LIPITOR) 20 MG tablet Take 20 mg by mouth daily. 02/24/21  Yes Provider, Historical, [name]  BREO ELLIPTA 100-25 MCG/INH AEPB Inhale 1 puff into the lungs daily as needed (shortness of breath). 06/30/19  Yes Provider, Historical, [name]  calcitRIOL (ROCALTROL) 0.25 MCG capsule Take 0.25 mcg by mouth daily.  03/14/19  Yes Provider, Historical, [name]  COLACE 100 MG capsule Take 100 mg by mouth daily as needed for mild constipation.  03/14/19  Yes Provider, Historical, [name]  cyclobenzaprine (FLEXERIL) 5 MG tablet Take 5 mg by mouth 3 (three) times daily as needed for  muscle spasms. 01/15/21  Yes Provider, Historical, [name]  ergocalciferol (VITAMIN D2) 1.25 MG (50000 UT) capsule Take 50,000 Units by mouth every Sunday.   Yes Provider, Historical, [name]  ferric citrate (AURYXIA) 1 GM 210 MG(Fe) tablet Take 210 mg by mouth 3 (three) times daily with meals.   Yes Provider, Historical, [name]  fludrocortisone (FLORINEF) 0.1 MG tablet Take 1 tablet (0.1 mg total) by mouth daily. 04/30/21  Yes Ghimire, Henreitta Leber, [name]  fluticasone (  FLONASE) 50 MCG/ACT nasal spray Place 1 spray into both nostrils daily as needed for allergies or rhinitis. 01/15/21  Yes Provider, Historical, [name]  loratadine-pseudoephedrine (CLARITIN-D 12 HOUR) 5-120 MG tablet Take 1 tablet by mouth 2 (two) times daily as needed for allergies.   Yes Provider, Historical, [name]  Melatonin 5 MG TABS Take 5 mg by mouth at bedtime as needed (for sleep).    Yes Provider, Historical, [name]  midodrine (PROAMATINE) 10 MG tablet Take 1.5 tablets (15 mg total) by mouth 3 (three) times daily with meals. 04/29/21  Yes Ghimire, Henreitta Leber, [name]  multivitamin (RENA-VIT) TABS tablet Take 1 tablet by mouth daily.   Yes Provider, Historical, [name]  ondansetron (ZOFRAN-ODT) 4 MG disintegrating tablet Take 4 mg by mouth every 6 (six) hours as needed for nausea. 04/20/21  Yes Provider, Historical, [name]  pantoprazole (PROTONIX) 40 MG tablet Take 1 tablet (40 mg total) by mouth 2 (two) times daily. 04/29/21  Yes Ghimire, Henreitta Leber, [name]  potassium chloride 20 MEQ/15ML (10%) SOLN Take 30 mLs by mouth daily. 05/07/21  Yes Provider, Historical, [name]  sodium chloride (OCEAN) 0.65 % SOLN nasal spray Place 2 sprays into both nostrils daily as needed for congestion.    Yes Provider, Historical, [name]  Sodium Chloride-Sodium Bicarb (NETI POT SINUS [place]) 2300-700 MG KIT Place 1 application into the nose 2 (two) times daily as needed. Patient taking differently: Place 1 application into the nose 2 (two) times daily as needed (congestion). 04/03/21  Yes Isla Pence, [name]      Vitals:   05/17/21 1215 05/17/21 1230 05/17/21 1430 05/17/21 1545  BP: 111/64 97/63 119/66 106/72  Pulse:  88  82  Resp: '14 13 14 19  ' Temp:      TempSrc:      SpO2: 100% 99%  100%  Weight:      Height:       Exam Gen alert, no distress, calm, elderly AAF, pleasant No rash, cyanosis or gangrene Sclera anicteric, throat clear  No jvd or bruits Chest clear bilat to bases, no rales/ wheezing RRR no MRG Abd firm but nontender and nondistended, good bs, LLQ PD cath w/ clean exit site, no rebound GU defer MS no joint effusions or deformity Ext no LE or UE edema, no wounds or ulcers Neuro is alert, Ox 3 , nf     Home meds include - flexeril prn, venotolin, asa, lipitor, colace, auryxia 1 tid ac, florinef, midodrine 15 tid, renal-vit, protonix 40 bid, Kdu 40 qd, prns/ vits/ supps     OP PD: 7X/ week, 39kg, 6 exchanges, fill vol 2500, dwell time 1.5h, no daybag, no daytime exchange   Assessment/ Plan: Abd pain / diarrhea/ leukocytosis - w/ WBC 19K. Getting IV empiric abx. Fluid from abdomen sent to lab and cell count is not elevated, gram stain w/o organisms. Pain is crampy and diarrhea-dependent, not in pain now and abdomen is nontender.  Doubt peritonitis in this case. Await final cx results.   ESRD - on PD x 2 yrs, did 1.5 yrs HD via [facility] prior to PD. Plan PD tonight (if gets a room, have d/w pt placement).   Hypotension/ volume - got 1.25L fluid bolus, BP's better in the 100s. Will use lowest UF fluids (1.5% dextrose) w/ PD.  No vol excess on exam, looks dry to euvolemic.  Diarrhea - GI pathogen panel ordered, per pmd Hypokalemia - chronic issue w/ many PD pts, cont to supplement      Rob  Adra Shepler  [name] 05/17/2021, 4:25 PM  Recent Labs  Lab 05/16/21 2333 05/17/21 1021  WBC 19.1* 21.4*  HGB 11.7* 11.6*   Recent Labs  Lab 05/16/21 2333 05/17/21 1021  K 2.8* 2.7*  BUN 30* 36*  CREATININE 10.67* 10.34*  CALCIUM 6.7* 6.8*  PHOS  --  6.1*

## 2021-05-17 NOTE — Progress Notes (Signed)
PROGRESS NOTE        PATIENT DETAILS Name: [name] Age: 76 y.o. Sex: female Date of Birth: 25-Oct-1945 Admit Date: 05/16/2021 Admitting Physician Etta Quill, DO [facility], [facility]  Brief Narrative: Patient is a 76 y.o. female with history of ESRD on PD, chronic hypertension, GERD-who presented with 2-day history of abdominal pain and loose stools.  Found to have significant leukocytosis.  Started on broad-spectrum antimicrobial therapy and admitted to the hospitalist service.  Subjective: Abdominal pain better-no diarrhea since she presented to the ED.  Objective: Vitals: Blood pressure (!) 99/59, pulse 85, temperature 98.7 F (37.1 C), temperature source Oral, resp. rate 16, height 5' (1.524 m), SpO2 100 %.   Exam: Gen Exam:Alert awake-not in any distress HEENT:atraumatic, normocephalic Chest: B/L clear to auscultation anteriorly CVS:S1S2 regular Abdomen:soft-mildly tender diffusely but no peritoneal signs. Extremities:no edema Neurology: Non focal Skin: no rash  Pertinent Labs/Radiology: Recent Labs  Lab 05/16/21 2333 05/17/21 1021  WBC 19.1* 21.4*  HGB 11.7* 11.6*  PLT 230 259  NA 139  --   K 2.8*  --   CREATININE 10.67*  --   AST 98*  --   ALT 41  --   ALKPHOS 147*  --   BILITOT 0.7  --     Assessment/Plan: Abdominal pain with leukocytosis: Concern for PD catheter related peritonitis-continue broad-spectrum IV antibiotics-have asked nursing staff to see if we can get dialysis nurse to draw peritoneal fluid from the catheter and sent for cultures.  Thankfully abdominal pain seems to have gotten better-however still has persistent leukocytosis.  CT shows pneumoperitoneum but suspect this is related to PD catheter.  ?  Diarrhea: Per patient she has had loose stools for the past several weeks-apparently has 2-3 episodes on a daily basis.  No further diarrhea since hospitalization.  If diarrhea reoccurs-we will send out  stool studies.  Hypotension: BP stable this morning-continue midodrine and Florinef.  Recent ACTH stim test was negative.   Hypokalemia: Likely due to Florinef use and diarrhea-replete and recheck.  ESRD on peritoneal dialysis: Nephrology followed closely.  Nephrology aware of potential discharge plans.  Recent history of dysphagia due to esophageal diverticulum: Underwent EGD and removal of impacted food on 04/27/2021.  Per patient report-her dysphagia has essentially resolved-tolerating diet.  Peptic ulcer disease: Nonbleeding gastric ulcer seen on EGD on 04/27/2021-continue PPI  HLD: Continue statin   Normocytic anemia: Due to ESRD-defer Aranesp/iron to nephrology.   COPD: Continue bronchodilators-not in exacerbation   1.8 x 1.1 cm spiculated nodular density seen in the right upper lobe of the lung: Incidental finding-per radiology report-this is not a new finding.  Continue outpatient follow-up with PCCM/primary care practitioner.   Subcentimeter incidental thyroid nodule: Per radiology report-no further follow-up imaging is recommended  BMI Estimated body mass index is 22.22 kg/m as calculated from the following:   Height as of this encounter: 5' (1.524 m).   Weight as of 04/29/21: 51.6 kg.    Procedures: None Consults: Renal DVT Prophylaxis: Heparin Code Status:Full code Family Communication: None at bedside  Time spent: 35 minutes-Greater than 50% of this time was spent in counseling, explanation of diagnosis, planning of further management, and coordination of care.   Disposition Plan: Status is: Observation  The patient will require care spanning > 2 midnights and should be moved to inpatient because: Leukocytosis/abdominal pain-concern  for PD catheter associated peritonitis-on broad-spectrum IV antibiotics-not yet stable for discharge given worsening leukocytosis.   Diet: Diet Order             Diet renal with fluid restriction Fluid restriction: 1200 mL  Fluid; Room service appropriate? Yes; Fluid consistency: Thin  Diet effective now                     Antimicrobial agents: Anti-infectives (From admission, onward)    Start     Dose/Rate Route Frequency Ordered Stop   10-Jun-2021 0500  ceFEPIme (MAXIPIME) 1 g in sodium chloride 0.9 % 100 mL IVPB        1 g 200 mL/hr over 30 Minutes Intravenous Every 24 hours 05/17/21 0409     05/17/21 1600  metroNIDAZOLE (FLAGYL) IVPB 500 mg        500 mg 100 mL/hr over 60 Minutes Intravenous Every 12 hours 05/17/21 0413     05/17/21 0430  metroNIDAZOLE (FLAGYL) IVPB 500 mg        500 mg 100 mL/hr over 60 Minutes Intravenous  Once 05/17/21 0418 05/17/21 0607   05/17/21 0415  ceFEPIme (MAXIPIME) 2 g in sodium chloride 0.9 % 100 mL IVPB        2 g 200 mL/hr over 30 Minutes Intravenous  Once 05/17/21 0401 05/17/21 0543   05/17/21 0415  metroNIDAZOLE (FLAGYL) IVPB 500 mg  Status:  Discontinued        500 mg 100 mL/hr over 60 Minutes Intravenous  Once 05/17/21 0401 05/17/21 0413   05/17/21 0415  vancomycin (VANCOCIN) IVPB 1000 mg/200 mL premix        1,000 mg 200 mL/hr over 60 Minutes Intravenous  Once 05/17/21 0401 05/17/21 0723   05/17/21 0409  vancomycin variable dose per unstable renal function (pharmacist dosing)         Does not apply See admin instructions 05/17/21 0409          MEDICATIONS: Scheduled Meds:  aspirin EC  81 mg Oral Daily   atorvastatin  20 mg Oral Daily   calcitRIOL  0.25 mcg Oral Daily   ferric citrate  210 mg Oral TID WC   fludrocortisone  0.1 mg Oral Daily   heparin  5,000 Units Subcutaneous Q8H   midodrine  15 mg Oral TID WC   multivitamin  1 tablet Oral Daily   pantoprazole  40 mg Oral BID   potassium chloride  40 mEq Oral Daily   vancomycin variable dose per unstable renal function (pharmacist dosing)   Does not apply See admin instructions   Continuous Infusions:  [START ON 06-10-2021] ceFEPime (MAXIPIME) IV     metronidazole     PRN Meds:.acetaminophen  **OR** acetaminophen, albuterol, docusate sodium, fentaNYL (SUBLIMAZE) injection, fluticasone, fluticasone furoate-vilanterol, melatonin, ondansetron **OR** ondansetron (ZOFRAN) IV   I have personally reviewed following labs and imaging studies  LABORATORY DATA: CBC: Recent Labs  Lab 05/16/21 2333 05/17/21 1021  WBC 19.1* 21.4*  NEUTROABS 16.8*  --   HGB 11.7* 11.6*  HCT 34.0* 33.0*  MCV 91.4 89.4  PLT 230 462    Basic Metabolic Panel: Recent Labs  Lab 05/16/21 2333  NA 139  K 2.8*  CL 102  CO2 20*  GLUCOSE 109*  BUN 30*  CREATININE 10.67*  CALCIUM 6.7*    GFR: CrCl cannot be calculated (Unknown ideal weight.).  Liver Function Tests: Recent Labs  Lab 05/16/21 2333  AST 98*  ALT 41  ALKPHOS  147*  BILITOT 0.7  PROT 4.8*  ALBUMIN 2.0*   Recent Labs  Lab 05/16/21 2333  LIPASE 31   No results for input(s): AMMONIA in the last 168 hours.  Coagulation Profile: No results for input(s): INR, PROTIME in the last 168 hours.  Cardiac Enzymes: No results for input(s): CKTOTAL, CKMB, CKMBINDEX, TROPONINI in the last 168 hours.  BNP (last 3 results) No results for input(s): PROBNP in the last 8760 hours.  Lipid Profile: No results for input(s): CHOL, HDL, LDLCALC, TRIG, CHOLHDL, LDLDIRECT in the last 72 hours.  Thyroid Function Tests: No results for input(s): TSH, T4TOTAL, FREET4, T3FREE, THYROIDAB in the last 72 hours.  Anemia Panel: No results for input(s): VITAMINB12, FOLATE, FERRITIN, TIBC, IRON, RETICCTPCT in the last 72 hours.  Urine analysis:    Component Value Date/Time   COLORURINE YELLOW 06/25/2019 2050   APPEARANCEUR HAZY (A) 06/25/2019 2050   LABSPEC 1.016 06/25/2019 2050   PHURINE 5.0 06/25/2019 2050   GLUCOSEU NEGATIVE 06/25/2019 2050   GLUCOSEU NEGATIVE 06/03/2014 1038   HGBUR NEGATIVE 06/25/2019 2050   BILIRUBINUR NEGATIVE 06/25/2019 2050   KETONESUR NEGATIVE 06/25/2019 2050   PROTEINUR NEGATIVE 06/25/2019 2050   UROBILINOGEN 0.2  06/03/2014 1038   NITRITE NEGATIVE 06/25/2019 2050   LEUKOCYTESUR TRACE (A) 06/25/2019 2050    Sepsis Labs: Lactic Acid, Venous    Component Value Date/Time   LATICACIDVEN 2.4 ([place]) 05/17/2021 0315    MICROBIOLOGY: Recent Results (from the past 240 hour(s))  Resp Panel by RT-PCR (Flu A&B, Covid) Nasopharyngeal Swab     Status: None   Collection Time: 05/17/21  5:20 AM   Specimen: Nasopharyngeal Swab; Nasopharyngeal(NP) swabs in vial transport medium  Result Value Ref Range Status   SARS Coronavirus 2 by RT PCR NEGATIVE NEGATIVE Final    Comment: (NOTE) SARS-CoV-2 target nucleic acids are NOT DETECTED.  The SARS-CoV-2 RNA is generally detectable in upper respiratory specimens during the acute phase of infection. The lowest concentration of SARS-CoV-2 viral copies this assay can detect is 138 copies/mL. A negative result does not preclude SARS-Cov-2 infection and should not be used as the sole basis for treatment or other patient management decisions. A negative result may occur with  improper specimen collection/handling, submission of specimen other than nasopharyngeal swab, presence of viral mutation(s) within the areas targeted by this assay, and inadequate number of viral copies(<138 copies/mL). A negative result must be combined with clinical observations, patient history, and epidemiological information. The expected result is Negative.  Fact Sheet for Patients:  EntrepreneurPulse.com.au  Fact Sheet for Healthcare Providers:  IncredibleEmployment.be  This test is no t yet approved or cleared by the [place] FDA and  has been authorized for detection and/or diagnosis of SARS-CoV-2 by FDA under an Emergency Use Authorization (EUA). This EUA will remain  in effect (meaning this test can be used) for the duration of the COVID-19 declaration under Section 564(b)(1) of the Act, 21 U.S.C.section 360bbb-3(b)(1), unless the  authorization is terminated  or revoked sooner.       Influenza A by PCR NEGATIVE NEGATIVE Final   Influenza B by PCR NEGATIVE NEGATIVE Final    Comment: (NOTE) The Xpert Xpress SARS-CoV-2/FLU/RSV plus assay is intended as an aid in the diagnosis of influenza from Nasopharyngeal swab specimens and should not be used as a sole basis for treatment. Nasal washings and aspirates are unacceptable for Xpert Xpress SARS-CoV-2/FLU/RSV testing.  Fact Sheet for Patients: EntrepreneurPulse.com.au  Fact Sheet for Healthcare Providers: IncredibleEmployment.be  This test  is not yet approved or cleared by the [place] and has been authorized for detection and/or diagnosis of SARS-CoV-2 by FDA under an Emergency Use Authorization (EUA). This EUA will remain in effect (meaning this test can be used) for the duration of the COVID-19 declaration under Section 564(b)(1) of the Act, 21 U.S.C. section 360bbb-3(b)(1), unless the authorization is terminated or revoked.  Performed at [facility], [place] [address], [place], [place] [address]     RADIOLOGY STUDIES/RESULTS: CT ABDOMEN PELVIS WO CONTRAST  Result Date: 05/17/2021 CLINICAL DATA:  Abdominal pain. EXAM: CT ABDOMEN AND PELVIS WITHOUT CONTRAST TECHNIQUE: Multidetector CT imaging of the abdomen and pelvis was performed following the standard protocol without IV contrast. COMPARISON:  CT dated 04/25/2021. FINDINGS: Evaluation of this exam is limited in the absence of intravenous contrast. Lower chest: Bibasilar scarring. There is no peritoneum of indeterminate etiology, possibly related to peritoneal dialysis catheter. Clinical correlation is recommended. Small free fluid in the pelvis. Hepatobiliary: The liver is unremarkable. There is sludge or small stones within the gallbladder. No pericholecystic fluid. Pancreas: No acute inflammatory changes. Spleen: Normal in size without focal abnormality.  Adrenals/Urinary Tract: Moderate bilateral renal parenchyma atrophy. There is no hydronephrosis or nephrolithiasis on either side. The urinary bladder is collapsed. Stomach/Bowel: Mild diffuse thickened appearance of the colon, likely related to underdistention. Mild colitis is less likely. Clinical correlation is recommended. Scattered sigmoid diverticula without active inflammatory changes. There is no bowel obstruction. The appendix is not visualized with certainty. No inflammatory changes identified in the right lower quadrant. Vascular/Lymphatic: Mild aortoiliac atherosclerotic disease. The IVC is unremarkable. No portal venous gas. There is no adenopathy. Reproductive: Hysterectomy. Other: Peritoneal dialysis catheter with tip in the right hemipelvis. Musculoskeletal: Osteopenia with degenerative changes of the spine. No acute osseous pathology. IMPRESSION: 1. Pneumoperitoneum, possibly introduced via peritoneal dialysis catheter. Clinical correlation is recommended to exclude bowel perforation. 2. Mild diffuse thickened appearance of the colon, likely related to underdistention. Mild colitis is less likely. No bowel obstruction. 3. Gallbladder sludge or small stones. 4. Aortic Atherosclerosis (ICD10-I70.0). Electronically Signed   By: Anner Crete M.D.   On: 05/17/2021 00:15   DG Chest Portable 1 View  Result Date: 05/17/2021 CLINICAL DATA:  Hypotension.  Rule out occult infection EXAM: PORTABLE CHEST 1 VIEW COMPARISON:  04/22/2021 FINDINGS: Areas of scarring in the lungs bilaterally. Postoperative changes in the left lung. Heart is normal size. No confluent airspace opacities or effusions. No acute bony abnormality. IMPRESSION: Postsurgical and chronic changes/scarring.  No active disease. Electronically Signed   By: Rolm Baptise M.D.   On: 05/17/2021 00:05     LOS: 0 days   Oren Binet, MD  [facility]    To contact the attending provider between 7A-7P or the covering provider  during after hours 7P-7A, please log into the web site [email or web address] and access using universal [place] password for that web site. If you do not have the password, please call the hospital operator.  05/17/2021, 10:54 AM

## 2021-05-17 NOTE — ED Notes (Signed)
Patient transported to CT 

## 2021-05-17 NOTE — ED Notes (Signed)
Pillows provided to pt and placed behind her head per request

## 2021-05-17 NOTE — Progress Notes (Signed)
New admission to room 5N20. Patient is A&O*4. Able to verbalize her needs to staff. Daughter at bedside. VS checked and cardiac monitor applied and center tele notified. No skin issues noted. PD catheter to abdomen, dry and intact. Bed kept in low position and locked. Call bell in reach. Patient and daughter requested palliative consult and MD notified. Will continue to monitor.

## 2021-05-17 NOTE — Progress Notes (Signed)
Pharmacy Antibiotic Note  [name] is a 76 y.o. female admitted on 05/16/2021 with  intra-abdominal .  Pharmacy has been consulted for Cefepime and vancomycin dosing.  WBC elevated. Patient has a h/o ESRD on peritoneal dialysis   Plan: -Cefepime 1 gm IV Q 24 hours -Vancomycin 1 gm IV load followed by dosing per random levels  -Monitor cultures and clinical progress   Height: 5' (152.4 cm) IBW/kg (Calculated) : 45.5  Temp (24hrs), Avg:98.7 F (37.1 C), Min:98.7 F (37.1 C), Max:98.7 F (37.1 C)  Recent Labs  Lab 05/16/21 2333 05/16/21 2335  WBC 19.1*  --   CREATININE 10.67*  --   LATICACIDVEN  --  3.3*    CrCl cannot be calculated (Unknown ideal weight.).    Allergies  Allergen Reactions   Ace Inhibitors Anaphylaxis, Swelling and Other (See Comments)    Angioedema   Calcium Channel Blockers Anaphylaxis, Itching, Rash and Other (See Comments)    Red rash, Knot (sore and itches), and a fever for a couple days (per pt report)   Pneumococcal Vaccines Other (See Comments)    Red rash, Knot (sore and itches), A fever for a couple days (per pt report)   Tetanus Toxoid Swelling and Other (See Comments)    FEVER and cellulitis in an arm     Latex Rash    Reaction to gloves   Tape Rash and Other (See Comments)    PLEASE USE PAPER TAPE!!    Antimicrobials this admission: Cefepime 1/9 >>  Vancomycin 1/9 >>   Dose adjustments this admission:  Microbiology results: 1/9 BCx >>  1/9 UCx >>   Thank you for allowing pharmacy to be a part of this patients care.  Albertina Parr, PharmD., BCPS, BCCCP Clinical Pharmacist Please refer to [facility] for unit-specific pharmacist

## 2021-05-17 NOTE — Code Documentation (Signed)
°  Patient Name: [name]   MRN: [id number]   Date of Birth/ Sex: 09/12/1945 , female      Admission Date: 05/16/2021  Attending Provider: Jonetta Osgood, MD  Primary Diagnosis: Sepsis [facility])    Indication: Pt was in her usual state of health until this AM, when she was noted to be hypotensive and altered. Code blue was subsequently called. At the time of arrival on scene, ACLS protocol was underway.   Technical Description:  - CPR performance duration:  13 minutes  - Was defibrillation or cardioversion used? No   - Was external pacer placed? Yes  - Was patient intubated pre/post CPR? Yes   Medications Administered: Y = Yes; Blank = No Amiodarone    Atropine  X  Calcium  X  Epinephrine  X  Lidocaine    Magnesium    Norepinephrine    Phenylephrine    Sodium bicarbonate    Vasopressin     Post CPR evaluation:  - Final Status - Was patient successfully resuscitated ? Yes - What is current rhythm? Sinus tachycardia - What is current hemodynamic status? Critically ill   Miscellaneous Information:  - Labs sent, including: Yes - CBC, CMP, magnesium, troponin, lactic acid, ABG  - Primary team notified?  Yes  - Family Notified? Yes  - Additional notes/ transfer status:  Patient was transferred to 3M05. Hand off given to Dr. Johnney Killian, MD  05/17/2021, 11:41 PM

## 2021-05-17 NOTE — Plan of Care (Signed)

## 2021-05-17 NOTE — ED Notes (Signed)
Pt became severely nauseous and began to dry-heave after swallowing 1 klor-con tablet split in half. Plan to administer IV zofran (PRN) and offer second ordered klor-con tablet if zofran successfully treats current nausea upon reassessment.

## 2021-05-17 NOTE — H&P (Signed)
History and Physical    [name] TDH:[id number] DOB: 10/23/1945 DOA: 05/16/2021  [facility]P: [facility] [place]  Patient coming from: Home  I have personally briefly reviewed patient's old medical records in [place]  Chief Complaint: Diarrhea, abd pain  HPI: [name] is a 76 y.o. female with medical history significant of ESRD on PD, hypotension on midodrine, GERD.  Pt presents to the ED with c/o abd pain, diarrhea.  Symptoms onset 2 days ago, persistent and worsening.  Notes that her dialysis fluid is clear, not yellow like the time last year when she had peritonitis.  Due to not feeling well today she didn't take her midodrine.  No N/V.  Abd pain 10/10 in severity.  Poor PO intake due to pain.   ED Course: Initial BP soft (64W-80H systolic)  Given 2.12Y bolus, given 34m PO midodrine.  BP now in 948Gsystolic (seems to be about her baseline based on DC in Dec).  WBC 19k with left shift (this is new and was not present in dec).  K 2.8.  CT AP = small amount of fluid in pelvis, some free air in pelvis but this could just be due to PD.  Some diffuse bowel wall thickening.   Past Medical History:  Diagnosis Date   [place]   Allergic rhinitis    Anxiety    CAP (community acquired pneumonia) 06/2016   /[name]2/03/2017   Cervical disc disease    /notes 07/20/2016   Chest pain at rest 06/18/2012   COPD [place]ch[place] obstructive pulmonary disease[place] 05/23/2012   Diverticulosis    DJD (degenerative joint disease)    Esophageal diverticulum    Esophageal stricture    ESRD (end stage renal disease) (HCC)    GERD (gastroesophageal reflux disease)    Hiatal hernia    History of blood transfusion 06/2016   "when I was hospitalized w/sepsis"   Hyperlipidemia 05/23/2012   T. Chol 234, LDL 130    Hypertension    IBS (irritable bowel syndrome)    Lung nodules    hx  with extensive workup including lung biopsy ruling out  Sjogren's disease/notes 07/20/2016   Osteopenia    Recurrent sinusitis    /noters 07/20/2016   Renal insufficiency    Sinus headache    "daily lately" (07/20/2016)   Traumatic arthritis     Past Surgical History:  Procedure Laterality Date   ANTERIOR CERVICAL DECOMP/DISCECTOMY FUSION  2005   BACK SURGERY     BIOPSY  04/27/2021   Procedure: BIOPSY;  Surgeon: MIrving Copas, MD;  [facility]  Service: Gastroenterology;;   COLONOSCOPY  2001   /Archie Endo5/15/2012   CYSTECTOMY Bilateral    hx/notes 09/21/2010   ESOPHAGOGASTRODUODENOSCOPY N/A 04/27/2021   Procedure: ESOPHAGOGASTRODUODENOSCOPY (EGD);  Surgeon: MIrving Copas, MD;  [place]  Service: Gastroenterology;  Laterality: N/A;   FOREIGN BODY REMOVAL  04/27/2021   Procedure: FOREIGN BODY REMOVAL;  Surgeon: MRush LandmarkGTelford Nab, MD;  [place]  Service: Gastroenterology;;   IR GENERIC HISTORICAL  07/20/2016   IR FLUORO GUIDE CV LINE RIGHT 07/20/2016 JSandi Mariscal MD [facility]   IR GENERIC HISTORICAL  07/20/2016   IR [place] ACCESS RIGHT 07/20/2016 JSandi Mariscal MD [facility]   LUNG BIOPSY Left    left side, not cancerous, thoracotomy   SAVORY DILATION N/A 04/27/2021   Procedure: SAVORY DILATION;  Surgeon: MIrving Copas, MD;  [place]  Service: Gastroenterology;  Laterality: N/A;   TONSILLECTOMY AND ADENOIDECTOMY     hx/notes 09/21/2010   VAGINAL HYSTERECTOMY     hx w/oophorectomy/notes 09/21/2010     reports that she quit smoking about 36 years ago. Her smoking use included cigarettes. She has a 0.96 pack-year smoking history. She has never used smokeless tobacco. She reports current alcohol use of about 1.0 standard drink per week. She reports that she does not use drugs.  Allergies  Allergen Reactions   Ace Inhibitors Anaphylaxis, Swelling and Other (See Comments)    Angioedema   Calcium Channel Blockers Anaphylaxis, Itching, Rash and Other  (See Comments)    Red rash, Knot (sore and itches), and a fever for a couple days (per pt report)   Pneumococcal Vaccines Other (See Comments)    Red rash, Knot (sore and itches), A fever for a couple days (per pt report)   Tetanus Toxoid Swelling and Other (See Comments)    FEVER and cellulitis in an arm     Latex Rash    Reaction to gloves   Tape Rash and Other (See Comments)    PLEASE USE PAPER TAPE!!    Family History  Problem Relation Age of Onset   Throat cancer Father    Hypertension Sister    Thyroid disease Daughter    Hypertension Sister        x 2   Breast cancer Maternal Aunt    Blindness Sister        legally blind   Colon cancer Neg Hx    Stomach cancer Neg Hx     Prior to Admission medications   Medication Sig Start Date End Date Taking? Authorizing Provider  acetaminophen (TYLENOL) 500 MG tablet Take 1,000 mg by mouth every 6 (six) hours as needed (for headaches or pain).    Yes [provider]  albuterol (VENTOLIN HFA) 108 (90 Base) MCG/ACT inhaler Inhale 1-2 puffs into the lungs every 6 (six) hours as needed for wheezing or shortness of breath. 12/16/19  Yes Parrett, Tammy S, NP  aspirin EC 81 MG tablet Take 81 mg by mouth daily.   Yes [provider]  atorvastatin (LIPITOR) 20 MG tablet Take 20 mg by mouth daily. 02/24/21  Yes [provider]  BREO ELLIPTA 100-25 MCG/INH AEPB Inhale 1 puff into the lungs daily as needed (shortness of breath). 06/30/19  Yes [provider]  calcitRIOL (ROCALTROL) 0.25 MCG capsule Take 0.25 mcg by mouth daily.  03/14/19  Yes [provider]  COLACE 100 MG capsule Take 100 mg by mouth daily as needed for mild constipation.  03/14/19  Yes [provider]  cyclobenzaprine (FLEXERIL) 5 MG tablet Take 5 mg by mouth 3 (three) times daily as needed for muscle spasms. 01/15/21  Yes [provider]  ergocalciferol (VITAMIN D2) 1.25 MG (50000 UT) capsule Take 50,000 Units by mouth  every Sunday.   Yes [provider]  ferric citrate (AURYXIA) 1 GM 210 MG(Fe) tablet Take 210 mg by mouth 3 (three) times daily with meals.   Yes [provider]  fludrocortisone (FLORINEF) 0.1 MG tablet Take 1 tablet (0.1 mg total) by mouth daily. 04/30/21  Yes Ghimire, Henreitta Leber, MD  fluticasone (FLONASE) 50 MCG/ACT nasal spray Place 1 spray into both nostrils daily as needed for allergies or rhinitis. 01/15/21  Yes [provider]  loratadine-pseudoephedrine (CLARITIN-D 12 HOUR) 5-120 MG tablet Take 1 tablet by mouth 2 (two) times daily as needed for allergies.  Yes [provider]  Melatonin 5 MG TABS Take 5 mg by mouth at bedtime as needed (for sleep).    Yes [provider]  midodrine (PROAMATINE) 10 MG tablet Take 1.5 tablets (15 mg total) by mouth 3 (three) times daily with meals. 04/29/21  Yes Ghimire, Henreitta Leber, MD  multivitamin (RENA-VIT) TABS tablet Take 1 tablet by mouth daily.   Yes [provider]  ondansetron (ZOFRAN-ODT) 4 MG disintegrating tablet Take 4 mg by mouth every 6 (six) hours as needed for nausea. 04/20/21  Yes [provider]  pantoprazole (PROTONIX) 40 MG tablet Take 1 tablet (40 mg total) by mouth 2 (two) times daily. 04/29/21  Yes Ghimire, Henreitta Leber, MD  potassium chloride 20 MEQ/15ML (10%) SOLN Take 30 mLs by mouth daily. 05/07/21  Yes [provider]  sodium chloride (OCEAN) 0.65 % SOLN nasal spray Place 2 sprays into both nostrils daily as needed for congestion.    Yes [provider]  Sodium Chloride-Sodium Bicarb (NETI POT SINUS [place]) 2300-700 MG KIT Place 1 application into the nose 2 (two) times daily as needed. Patient taking differently: Place 1 application into the nose 2 (two) times daily as needed (congestion). 04/03/21  Yes Isla Pence, MD    Physical Exam: Vitals:   05/17/21 0230 05/17/21 0250 05/17/21 0345 05/17/21 0400  BP: 90/69 (!) 107/56 (!) 82/43 95/75  Pulse:  87     Resp: '20 15 16 20  ' Temp:      TempSrc:      SpO2:  100%    Height:        Constitutional: Chronically ill appearing. AAOx3 Abd: No TTP, no rebound, no guarding   Labs on Admission: I have personally reviewed following labs and imaging studies  CBC: Recent Labs  Lab 05/16/21 2333  WBC 19.1*  NEUTROABS 16.8*  HGB 11.7*  HCT 34.0*  MCV 91.4  PLT 449   Basic Metabolic Panel: Recent Labs  Lab 05/16/21 2333  NA 139  K 2.8*  CL 102  CO2 20*  GLUCOSE 109*  BUN 30*  CREATININE 10.67*  CALCIUM 6.7*   GFR: CrCl cannot be calculated (Unknown ideal weight.). Liver Function Tests: Recent Labs  Lab 05/16/21 2333  AST 98*  ALT 41  ALKPHOS 147*  BILITOT 0.7  PROT 4.8*  ALBUMIN 2.0*   Recent Labs  Lab 05/16/21 2333  LIPASE 31   No results for input(s): AMMONIA in the last 168 hours. Coagulation Profile: No results for input(s): INR, PROTIME in the last 168 hours. Cardiac Enzymes: No results for input(s): CKTOTAL, CKMB, CKMBINDEX, TROPONINI in the last 168 hours. BNP (last 3 results) No results for input(s): PROBNP in the last 8760 hours. HbA1C: No results for input(s): HGBA1C in the last 72 hours. CBG: No results for input(s): GLUCAP in the last 168 hours. Lipid Profile: No results for input(s): CHOL, HDL, LDLCALC, TRIG, CHOLHDL, LDLDIRECT in the last 72 hours. Thyroid Function Tests: No results for input(s): TSH, T4TOTAL, FREET4, T3FREE, THYROIDAB in the last 72 hours. Anemia Panel: No results for input(s): VITAMINB12, FOLATE, FERRITIN, TIBC, IRON, RETICCTPCT in the last 72 hours. Urine analysis:    Component Value Date/Time   COLORURINE YELLOW 06/25/2019 2050   APPEARANCEUR HAZY (A) 06/25/2019 2050   LABSPEC 1.016 06/25/2019 2050   PHURINE 5.0 06/25/2019 2050   GLUCOSEU NEGATIVE 06/25/2019 2050   GLUCOSEU NEGATIVE 06/03/2014 1038   HGBUR NEGATIVE 06/25/2019 2050   BILIRUBINUR NEGATIVE 06/25/2019 2050   KETONESUR NEGATIVE  06/25/2019 2050    [place] 06/25/2019 2050   UROBILINOGEN 0.2 06/03/2014 1038   NITRITE NEGATIVE 06/25/2019 2050   LEUKOCYTESUR TRACE (A) 06/25/2019 2050    Radiological Exams on Admission: CT ABDOMEN PELVIS WO CONTRAST  Result Date: 05/17/2021 CLINICAL DATA:  Abdominal pain. EXAM: CT ABDOMEN AND PELVIS WITHOUT CONTRAST TECHNIQUE: Multidetector CT imaging of the abdomen and pelvis was performed following the standard protocol without IV contrast. COMPARISON:  CT dated 04/25/2021. FINDINGS: Evaluation of this exam is limited in the absence of intravenous contrast. Lower chest: Bibasilar scarring. There is no peritoneum of indeterminate etiology, possibly related to peritoneal dialysis catheter. Clinical correlation is recommended. Small free fluid in the pelvis. Hepatobiliary: The liver is unremarkable. There is sludge or small stones within the gallbladder. No pericholecystic fluid. Pancreas: No acute inflammatory changes. Spleen: Normal in size without focal abnormality. Adrenals/Urinary Tract: Moderate bilateral renal parenchyma atrophy. There is no hydronephrosis or nephrolithiasis on either side. The urinary bladder is collapsed. Stomach/Bowel: Mild diffuse thickened appearance of the colon, likely related to underdistention. Mild colitis is less likely. Clinical correlation is recommended. Scattered sigmoid diverticula without active inflammatory changes. There is no bowel obstruction. The appendix is not visualized with certainty. No inflammatory changes identified in the right lower quadrant. Vascular/Lymphatic: Mild aortoiliac atherosclerotic disease. The IVC is unremarkable. No portal venous gas. There is no adenopathy. Reproductive: Hysterectomy. Other: Peritoneal dialysis catheter with tip in the right hemipelvis. Musculoskeletal: Osteopenia with degenerative changes of the spine. No acute osseous pathology. IMPRESSION: 1. Pneumoperitoneum, possibly introduced via peritoneal dialysis catheter. Clinical  correlation is recommended to exclude bowel perforation. 2. Mild diffuse thickened appearance of the colon, likely related to underdistention. Mild colitis is less likely. No bowel obstruction. 3. Gallbladder sludge or small stones. 4. Aortic Atherosclerosis (ICD10-I70.0). Electronically Signed   By: Anner Crete M.D.   On: 05/17/2021 00:15   DG Chest Portable 1 View  Result Date: 05/17/2021 CLINICAL DATA:  Hypotension.  Rule out occult infection EXAM: PORTABLE CHEST 1 VIEW COMPARISON:  04/22/2021 FINDINGS: Areas of scarring in the lungs bilaterally. Postoperative changes in the left lung. Heart is normal size. No confluent airspace opacities or effusions. No acute bony abnormality. IMPRESSION: Postsurgical and chronic changes/scarring.  No active disease. Electronically Signed   By: Rolm Baptise M.D.   On: 05/17/2021 00:05    EKG: Independently reviewed.  Assessment/Plan Principal Problem:   Sepsis ([place]) Active Problems:   RPGN (rapidly progressive glomerulonephritis)   Hypokalemia   Stage 5 chronic kidney disease on chronic dialysis ([place])   Hypotension   Diarrhea    Concern for possible sepsis - Pt with new 19k with L shift.  Diarrhea.  Concern for possible abdominal source.  Admittedly though no documented fever, tachypnea, nor tachycardia to complete the 2/4 SIRS. Does have hypotension, although she also has chronic hypotension so not clear if acute or chronic today Lactate of 3.3 Overall dont think we can risk missing treating sepsis Getting empiric cefepime, flagyl, vanc in ED Check MRSA PCR nares BCx ordered and pending PD fluid cell count, cultures ordered and pending Can wait to be drawn till AM per EDP discussion with nephrology IVF: 1.25L Tele monitor Hypotension - Likely due to missing midodrine Getting PO midodrine in ED ACTH stim test last month rules out adrenal insufficiency Diarrhea - Ordering GI pathogen pnl Cant order C.Diff testing due to new ID rules Day  team will need to decide if they want ID consult for C.Diff testing or not  Hypokalemia - Cont daily 29mq PO in AM Getting another 413m PO x1 now ESRD on PD - Nephro consulted and will see pt in AM  DVT prophylaxis: Heparin Comern­o Code Status: Full Family Communication: No family in room Disposition Plan: Home after work up of diarrhea and possible sepsis Consults called: EDP spoke with Dr. SaJoelyn Omsdmission status: Place in [place]ospitalists  How to contact the TRLaser Surgery Holding Company Ltdttending or Consulting provider 7AInvernessr covering provider during after hours 7PWest Terre Hautefor this patient?  Check the care team in [facility] look for a) attending/consulting TRH provider listed and b) the TRAscension Seton Southwest Hospitaleam listed Log into [email or web address]  Amion Physician Scheduling and messaging for groups and whole hospitals  On call and physician scheduling software for group practices, residents, hospitalists and other medical providers for call, clinic, rotation and shift schedules. OnCall Enterprise is a hospital-wide system for scheduling doctors and paging doctors on call. EasyPlot is for scientific plotting and data analysis.  [email or web address]  and use [place]'s universal password to access. If you do not have the password, please contact the hospital operator.  Locate the TRDavie County Hospitalrovider you are looking for under [facility] and page to a number that you can be directly reached. If you still have difficulty reaching the provider, please page the DOSt. Clare HospitalDirector on Call) for the Hospitalists listed on amion for assistance.  05/17/2021, 4:39 AM

## 2021-05-17 NOTE — Progress Notes (Signed)
Arrived to patient room to obtain culture, able to remove fluid will take to lab for testing

## 2021-05-17 NOTE — ED Notes (Signed)
Dr. Sherry Ruffing made aware of pt lactic acid 3.3

## 2021-05-18 DIAGNOSIS — Z7189 Other specified counseling: Secondary | ICD-10-CM

## 2021-05-18 DIAGNOSIS — G9341 Metabolic encephalopathy: Secondary | ICD-10-CM

## 2021-05-18 DIAGNOSIS — A419 Sepsis, unspecified organism: Secondary | ICD-10-CM

## 2021-05-18 DIAGNOSIS — R6521 Severe sepsis with septic shock: Secondary | ICD-10-CM | POA: Diagnosis not present

## 2021-05-18 DIAGNOSIS — R570 Cardiogenic shock: Secondary | ICD-10-CM

## 2021-05-18 DIAGNOSIS — E872 Acidosis, unspecified: Secondary | ICD-10-CM

## 2021-05-18 DIAGNOSIS — R571 Hypovolemic shock: Secondary | ICD-10-CM

## 2021-05-18 DIAGNOSIS — I469 Cardiac arrest, cause unspecified: Secondary | ICD-10-CM

## 2021-05-18 LAB — GASTROINTESTINAL PANEL BY PCR, STOOL (REPLACES STOOL CULTURE)

## 2021-05-18 LAB — URINE CULTURE: Culture: NO GROWTH

## 2021-05-18 LAB — GLUCOSE, CAPILLARY: Glucose-Capillary: 211 mg/dL — ABNORMAL HIGH (ref 70–99)

## 2021-05-18 LAB — PATHOLOGIST SMEAR REVIEW

## 2021-05-18 MED ORDER — FENTANYL CITRATE (PF) 100 MCG/2ML IJ SOLN
25.0000 ug | INTRAMUSCULAR | Status: DC | PRN
  Administered 2021-05-18: 25 ug via INTRAVENOUS

## 2021-05-18 MED ORDER — GLYCOPYRROLATE 0.2 MG/ML IJ SOLN: 0.2000 mg | INTRAMUSCULAR | Status: DC | PRN

## 2021-05-18 MED ORDER — ACETAMINOPHEN 160 MG/5ML PO SOLN: 650.0000 mg | ORAL | Status: DC

## 2021-05-18 MED ORDER — BUSPIRONE HCL 15 MG PO TABS: 30.0000 mg | ORAL_TABLET | Freq: Three times a day (TID) | ORAL | Status: DC

## 2021-05-18 MED ORDER — ACETAMINOPHEN 325 MG PO TABS: 650.0000 mg | ORAL_TABLET | ORAL | Status: DC

## 2021-05-18 MED ORDER — MORPHINE SULFATE (PF) 2 MG/ML IV SOLN: 2.0000 mg | INTRAVENOUS | Status: DC | PRN

## 2021-05-18 MED ORDER — GLYCOPYRROLATE 1 MG PO TABS: 1.0000 mg | ORAL_TABLET | ORAL | Status: DC | PRN

## 2021-05-18 MED ORDER — MORPHINE 100MG IN NS 100ML (1MG/ML) PREMIX INFUSION
0.0000 mg/h | INTRAVENOUS | Status: DC
  Administered 2021-05-18: 5 mg/h via INTRAVENOUS
  Filled 2021-05-18: qty 100

## 2021-05-18 MED ORDER — MIDAZOLAM HCL 2 MG/2ML IJ SOLN: 1.0000 mg | INTRAMUSCULAR | Status: DC | PRN

## 2021-05-18 MED ORDER — DEXTROSE 5 % IV SOLN: INTRAVENOUS | Status: DC

## 2021-05-18 MED ORDER — MORPHINE BOLUS VIA INFUSION
5.0000 mg | INTRAVENOUS | Status: DC | PRN
  Filled 2021-05-18: qty 5

## 2021-05-18 MED ORDER — ACETAMINOPHEN 650 MG RE SUPP: 650.0000 mg | Freq: Four times a day (QID) | RECTAL | Status: DC | PRN

## 2021-05-18 MED ORDER — ACETAMINOPHEN 650 MG RE SUPP: 650.0000 mg | RECTAL | Status: DC

## 2021-05-18 MED ORDER — FENTANYL CITRATE (PF) 100 MCG/2ML IJ SOLN: 25.0000 ug | INTRAMUSCULAR | Status: DC | PRN

## 2021-05-18 MED ORDER — DIPHENHYDRAMINE HCL 50 MG/ML IJ SOLN: 25.0000 mg | INTRAMUSCULAR | Status: DC | PRN

## 2021-05-18 MED ORDER — ACETAMINOPHEN 325 MG PO TABS: 650.0000 mg | ORAL_TABLET | Freq: Four times a day (QID) | ORAL | Status: DC | PRN

## 2021-05-18 MED ORDER — POLYVINYL ALCOHOL 1.4 % OP SOLN: 1.0000 [drp] | Freq: Four times a day (QID) | OPHTHALMIC | Status: DC | PRN

## 2021-05-18 MED ORDER — PROPOFOL 1000 MG/100ML IV EMUL: 0.0000 ug/kg/min | INTRAVENOUS | Status: DC

## 2021-05-20 LAB — BODY FLUID CULTURE W GRAM STAIN: Culture: NO GROWTH

## 2021-05-21 MED FILL — Medication: Qty: 1 | Status: AC

## 2021-05-22 LAB — CULTURE, BLOOD (ROUTINE X 2)
Culture: NO GROWTH
Culture: NO GROWTH

## 2021-06-02 ENCOUNTER — Ambulatory Visit: Payer: Medicare HMO | Admitting: Internal Medicine

## 2021-06-09 NOTE — Progress Notes (Signed)
Chaplain engaged in an initial visit with Emanuelle's family.  Chaplain was able to walk with Saffron's daughter as they transferred [place] from [address] to [place].  Chaplain provided support to her and family as they arrived.  Chaplain was able to answer questions they had and serve as a liaison between medical staff and family.    Family is currently having a hard time processing information because both daughters had just seen Marg[place]rette earlier in the day and talked to her.  They are both really shocked with what is happening and the drastic change they see in their mom.  Chaplain encouraged them to ask questions to the physicians as they have them.  Family had a number of questions around [place] having an infection, her blood pressure, the pain she had been feeling in her stomach, and about the [employer].  They don't feel ready to answer any questions about DNR right now but are able to vocalize that they want Shawnika to have the quality of life she had before September of last year.  Family stated that Jacolyn had been feeling sick for the past couple of months.  She was very active before but had suddenly become sickly.  She had attributed it to sinus infections but family recognized she was not getting better.  Chaplain offered support, listening, and presence.    06/10/2021 0000  Clinical Encounter Type  Visited With Patient and family together  Visit Type Initial;Code;Spiritual support;Social support  Stress Factors  Family Stress Factors Loss of control;Major life changes;Exhausted;Lack of knowledge

## 2021-06-09 NOTE — Progress Notes (Signed)
01:50    Patient is a DNR and comfort care orders in place. Patient currently on morphine infusion for comfort. Family at bedside. Patient  asystolic in two leads on monitor. No peripheral pulses palpated, no heart tones or breath sounds auscultated, pupils fixed and dilated. Verified by this RN and Waymon Budge, RN. ELink RN and Physician made aware of cardiac time of death.

## 2021-06-09 NOTE — Death Summary Note (Signed)
DEATH SUMMARY   Patient Details  Name: [name] MRN: [id number] DOB: 07-24-1945  Admission/Discharge Information   Admit Date:  06-12-2021  Date of Death: Date of Death: June 14, 2021  Time of Death: Time of Death: 0150  Length of Stay: 0  Referring Physician: [facility] [place]   Reason(s) for Hospitalization  Abdominal pain  Diagnoses  Preliminary cause of death: Cardiac arrest secondary to septic/hypovolemic shock secondary to GI source Secondary Diagnoses (including complications and co-morbidities):  Principal Problem:   PEA (Pulseless electrical activity) ([place]) Active Problems:   RPGN (rapidly progressive glomerulonephritis)   Hypokalemia   Electrolyte abnormality   Stage 5 chronic kidney disease on chronic dialysis (HCC)   Hypotension   Septic shock (HCC)   Diarrhea   Hypovolemic shock (HCC)   Hypocalcemia   Metabolic acidosis   Goals of care, counseling/discussion   Acute metabolic encephalopathy   Brief Hospital Course (including significant findings, care, treatment, and services provided and events leading to death)  BARBETTE MCGLAUN is a 76 y.o. year old female 76 year old female with ESRD on PD who presented with abdominal pain and diarrhea x 3 days. In the ED she was hypotensive and received IVF resuscitation and broad spectrum antibiotics with improvement of SBP to 90s. Admitted to Hospitalist service for sepsis secondary to GI illness. Of note she has had recent admissions for hypotension in setting of N/V. Overnight on 1/9, she had worsening altered mental status and hypotension. She went into PEA arrest. CPR was performed for 13 minutes. Given epi x 2, atropine x 1 and dextrose   Transferred to ICU post-PEA arrest. On arrival she rapidly declined with worsening hypotension requiring significant vasopressor support. After discussion with family including two daughters, decision made to transition to comfort care.  Patient expired on 14-Jun-2021 at  0150.    Pertinent Labs and Studies  Significant Diagnostic Studies CT ABDOMEN PELVIS WO CONTRAST  Result Date: 05/17/2021 CLINICAL DATA:  Abdominal pain. EXAM: CT ABDOMEN AND PELVIS WITHOUT CONTRAST TECHNIQUE: Multidetector CT imaging of the abdomen and pelvis was performed following the standard protocol without IV contrast. COMPARISON:  CT dated 04/25/2021. FINDINGS: Evaluation of this exam is limited in the absence of intravenous contrast. Lower chest: Bibasilar scarring. There is no peritoneum of indeterminate etiology, possibly related to peritoneal dialysis catheter. Clinical correlation is recommended. Small free fluid in the pelvis. Hepatobiliary: The liver is unremarkable. There is sludge or small stones within the gallbladder. No pericholecystic fluid. Pancreas: No acute inflammatory changes. Spleen: Normal in size without focal abnormality. Adrenals/Urinary Tract: Moderate bilateral renal parenchyma atrophy. There is no hydronephrosis or nephrolithiasis on either side. The urinary bladder is collapsed. Stomach/Bowel: Mild diffuse thickened appearance of the colon, likely related to underdistention. Mild colitis is less likely. Clinical correlation is recommended. Scattered sigmoid diverticula without active inflammatory changes. There is no bowel obstruction. The appendix is not visualized with certainty. No inflammatory changes identified in the right lower quadrant. Vascular/Lymphatic: Mild aortoiliac atherosclerotic disease. The IVC is unremarkable. No portal venous gas. There is no adenopathy. Reproductive: Hysterectomy. Other: Peritoneal dialysis catheter with tip in the right hemipelvis. Musculoskeletal: Osteopenia with degenerative changes of the spine. No acute osseous pathology. IMPRESSION: 1. Pneumoperitoneum, possibly introduced via peritoneal dialysis catheter. Clinical correlation is recommended to exclude bowel perforation. 2. Mild diffuse thickened appearance of the colon, likely  related to underdistention. Mild colitis is less likely. No bowel obstruction. 3. Gallbladder sludge or small stones. 4. Aortic Atherosclerosis (ICD10-I70.0). Electronically Signed  By: Anner Crete M.D.   On: 05/17/2021 00:15   DG Abd 1 View  Result Date: 21-May-2021 CLINICAL DATA:  Abdominal distension and NG placement. EXAM: ABDOMEN - 1 VIEW COMPARISON:  Abdominal radiograph dated 06/04/2018 and CT of the abdomen pelvis dated 05/17/2021. FINDINGS: Partially visualized enteric tube with tip in the proximal body of the stomach. Several normal caliber air-filled loops of small bowel noted. No bowel dilatation or evidence of obstruction. Peritoneal dialysis catheter with tip in the right hemipelvis. Degenerative changes of the spine. No acute osseous pathology. IMPRESSION: Partially visualized enteric tube with tip in the proximal body of the stomach. No bowel dilatation or evidence of obstruction. Electronically Signed   By: Anner Crete M.D.   On: 05/21/2021 00:05   CT CHEST ABDOMEN PELVIS W CONTRAST  Result Date: 04/25/2021 CLINICAL DATA:  Occult malignancy. EXAM: CT CHEST, ABDOMEN, AND PELVIS WITH CONTRAST TECHNIQUE: Multidetector CT imaging of the chest, abdomen and pelvis was performed following the standard protocol during bolus administration of intravenous contrast. CONTRAST:  144mL OMNIPAQUE IOHEXOL 300 MG/ML  SOLN COMPARISON:  Chest CT 07/01/2020. CT abdomen and pelvis 06/07/2018. CT chest 07/29/2016. FINDINGS: CT CHEST FINDINGS Cardiovascular: No significant vascular findings. Normal heart size. No pericardial effusion. The main pulmonary artery is enlarged compatible with pulmonary artery hypertension. Mediastinum/Nodes: There are bilateral thyroid nodules measuring up to 7 mm. There are no enlarged mediastinal or hilar lymph nodes identified. There is a small hiatal hernia. The esophagus is nondilated. Lungs/Pleura: There are new trace bilateral pleural effusions there is no  pneumothorax. There is stable mild elevation left hemidiaphragm. Mild emphysematous changes are again noted. Partial lobectomy changes are seen in the left lung. There are stable scattered areas of bronchiectasis, scarring, and parenchymal opacities. Focal slightly nodular opacity in the right upper lobe measuring 1.8 by 1.1 cm image 5/47 has not significantly changed. There are no new suspicious pulmonary nodules or lung infiltrates. The trachea and central airways are patent. Musculoskeletal: No chest wall mass or suspicious bone lesions identified. Cervical spinal fusion plate is present. CT ABDOMEN PELVIS FINDINGS Hepatobiliary: No focal liver abnormality is seen. No gallstones, gallbladder wall thickening, or biliary dilatation. Pancreas: There is diffuse pancreatic ductal prominence measuring 2 mm similar to the prior examination. No focal pancreatic lesions are seen. No peripancreatic inflammation. Spleen: Small in size, unchanged. Adrenals/Urinary Tract: The kidneys are atrophic bilaterally. There is a hypodensity in the superior pole the left kidney which is too small to characterize, likely a cyst. There is no hydronephrosis. There is no excretion of contrast from the kidneys. Adrenal glands and bladder are within normal limits. Stomach/Bowel: Small hiatal hernia is unchanged. Stomach is otherwise within normal limits. No evidence of bowel wall thickening, distention, or inflammatory changes. There is sigmoid colon diverticulosis without evidence for acute diverticulitis. The appendix is not seen. Vascular/Lymphatic: Aortic atherosclerosis. No enlarged abdominal or pelvic lymph nodes. Reproductive: Status post hysterectomy. No adnexal masses. Other: Peritoneal dialysis catheter is seen in the abdomen ending in the pelvis. There is a small amount of free fluid in the pelvis. There is a small amount of free air in the upper abdomen likely related to peritoneal dialysis. There is diffuse body wall edema.  Musculoskeletal: No acute or significant osseous findings. IMPRESSION: 1. New small bilateral pleural effusions. 2. Stable postsurgical changes, scarring, and bronchiectasis within the lungs. Scattered parenchymal opacities are unchanged including a slightly spiculated nodular density in the right upper lobe measuring 1.8 x 1.1 cm. No new  suspicious pulmonary nodules or pulmonary infiltrates. 3. Subcentimeter incidental thyroid nodule. No follow-up imaging is recommended. Reference: J Am Coll Radiol. 2015 Feb;12(2): 143-50 4. Small amount of free air and free fluid in the abdomen are likely related to peritoneal dialysis. 5. Mild body wall edema. 6. No other acute localizing process in the abdomen or pelvis. 7. Sigmoid colon diverticulosis. 8. Aortic Atherosclerosis (ICD10-I70.0) and Emphysema (ICD10-J43.9). Electronically Signed   By: Ronney Asters M.D.   On: 04/25/2021 17:08   DG CHEST PORT 1 VIEW  Result Date: 05/17/2021 CLINICAL DATA:  ET tube; OG tube; pneumoperitoneum; abdominal distention EXAM: PORTABLE CHEST 1 VIEW COMPARISON:  Chest x-ray 05/16/2021, CT chest 04/25/2021 FINDINGS: Endotracheal tube terminates 0.5 cm above the carina. Enteric tube coursing below the hemidiaphragm with tip and side port overlying the expected region of the gastric lumen. The heart and mediastinal contours are unchanged. Aortic calcification Biapical pleural/pulmonary scarring. Persistent vague airspace opacities overlying the right lung. Surgical changes noted overlying the left lung. No pulmonary edema. No pleural effusion. No pneumothorax. No acute osseous abnormality. IMPRESSION: 1. Endotracheal tube terminates 0.5 cm above the carina. Recommend retracting by 2 cm. 2. Enteric tube in good position. 3. Persistent vague airspace opacity overlying the right lung. 4. Please see separately dictated x-ray abdomen 05/17/2021. These results will be called to the ordering clinician or representative by the Radiologist Assistant,  and communication documented in the PACS or [employer]. Electronically Signed   By: Iven Finn M.D.   On: 05/17/2021 23:59   DG Chest Portable 1 View  Result Date: 05/17/2021 CLINICAL DATA:  Hypotension.  Rule out occult infection EXAM: PORTABLE CHEST 1 VIEW COMPARISON:  04/22/2021 FINDINGS: Areas of scarring in the lungs bilaterally. Postoperative changes in the left lung. Heart is normal size. No confluent airspace opacities or effusions. No acute bony abnormality. IMPRESSION: Postsurgical and chronic changes/scarring.  No active disease. Electronically Signed   By: Rolm Baptise M.D.   On: 05/17/2021 00:05   DG Chest Portable 1 View  Result Date: 04/22/2021 CLINICAL DATA:  Weakness EXAM: PORTABLE CHEST 1 VIEW COMPARISON:  04/12/2021 FINDINGS: There is hyperinflation of the lungs compatible with COPD. Mild elevation of the left hemidiaphragm. Postoperative changes in the left lung. No confluent airspace opacities or effusions. Heart is normal size. No acute bony abnormality. IMPRESSION: COPD/chronic changes.  No active disease. Electronically Signed   By: Rolm Baptise M.D.   On: 04/22/2021 21:12   DG ESOPHAGUS W SINGLE CM (SOL OR THIN BA)  Result Date: 04/26/2021 CLINICAL DATA:  Difficulty swallowing. Evaluate epiphrenic diverticulum. EXAM: ESOPHAGUS/BARIUM SWALLOW/TABLET STUDY TECHNIQUE: Single contrast examination was performed using thin liquid barium. This exam was performed by Rushie Nyhan, and was supervised and interpreted by Abigail Miyamoto, M.D. FLUOROSCOPY TIME:  Radiation Exposure Index (as provided by the fluoroscopic device): Not applicable. If the device does not provide the exposure index: Fluoroscopy Time:  2 minutes and 30 seconds Number of Acquired Images:  None COMPARISON:  Chest CT 04/25/2021 FINDINGS: Focused, single-contrast exam performed with the patient in a supine, minimally oblique position. Evaluation of esophageal peristalsis demonstrates tertiary contractions  with proximal escape waves and contrast stasis in the upper esophagus. Full column evaluation of the esophagus demonstrates no persistent narrowing or stricture. An epiphrenic diverticulum is identified at 3.5 x 3.1 cm, eccentric right. IMPRESSION: 1. Esophageal dysmotility.  No other explanation for dysphagia. 2. 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 Electronically Signed   By: Abigail Miyamoto M.D.   On: 04/26/2021  10:06    Microbiology Recent Results (from the past 240 hour(s))  Body fluid culture w Gram Stain     Status: None (Preliminary result)   Collection Time: 05/17/21  4:07 AM   Specimen: Peritoneal Washings; Body Fluid  Result Value Ref Range Status   Specimen Description PERITONEAL FLUID  Final   Special Requests NONE  Final   Gram Stain   Final    WBC PRESENT,BOTH PMN AND MONONUCLEAR NO ORGANISMS SEEN CYTOSPIN SMEAR Performed at [facility], [address], [place], [place] [address]    Culture PENDING  Incomplete   Report Status PENDING  Incomplete  Resp Panel by RT-PCR (Flu A&B, Covid) Nasopharyngeal Swab     Status: None   Collection Time: 05/17/21  5:20 AM   Specimen: Nasopharyngeal Swab; Nasopharyngeal(NP) swabs in vial transport medium  Result Value Ref Range Status   SARS Coronavirus 2 by RT PCR NEGATIVE NEGATIVE Final    Comment: (NOTE) SARS-CoV-2 target nucleic acids are NOT DETECTED.  The SARS-CoV-2 RNA is generally detectable in upper respiratory specimens during the acute phase of infection. The lowest concentration of SARS-CoV-2 viral copies this assay can detect is 138 copies/mL. A negative result does not preclude SARS-Cov-2 infection and should not be used as the sole basis for treatment or other patient management decisions. A negative result may occur with  improper specimen collection/handling, submission of specimen other than nasopharyngeal swab, presence of viral mutation(s) within the areas targeted by this assay, and inadequate number of  viral copies(<138 copies/mL). A negative result must be combined with clinical observations, patient history, and epidemiological information. The expected result is Negative.  Fact Sheet for Patients:  EntrepreneurPulse.com.au  Fact Sheet for Healthcare Providers:  IncredibleEmployment.be  This test is no t yet approved or cleared by the [place] FDA and  has been authorized for detection and/or diagnosis of SARS-CoV-2 by FDA under an Emergency Use Authorization (EUA). This EUA will remain  in effect (meaning this test can be used) for the duration of the COVID-19 declaration under Section 564(b)(1) of the Act, 21 U.S.C.section 360bbb-3(b)(1), unless the authorization is terminated  or revoked sooner.       Influenza A by PCR NEGATIVE NEGATIVE Final   Influenza B by PCR NEGATIVE NEGATIVE Final    Comment: (NOTE) The Xpert Xpress SARS-CoV-2/FLU/RSV plus assay is intended as an aid in the diagnosis of influenza from Nasopharyngeal swab specimens and should not be used as a sole basis for treatment. Nasal washings and aspirates are unacceptable for Xpert Xpress SARS-CoV-2/FLU/RSV testing.  Fact Sheet for Patients: EntrepreneurPulse.com.au  Fact Sheet for Healthcare Providers: IncredibleEmployment.be  This test is not yet approved or cleared by the [place] FDA and has been authorized for detection and/or diagnosis of SARS-CoV-2 by FDA under an Emergency Use Authorization (EUA). This EUA will remain in effect (meaning this test can be used) for the duration of the COVID-19 declaration under Section 564(b)(1) of the Act, 21 U.S.C. section 360bbb-3(b)(1), unless the authorization is terminated or revoked.  Performed at [facility], [place] [address] [place], [place] [address]   MRSA Next Gen by PCR, Nasal     Status: None   Collection Time: 05/17/21  9:59 AM   Specimen: Nasal Mucosa; Nasal  Swab  Result Value Ref Range Status   MRSA by PCR Next Gen NOT DETECTED NOT DETECTED Final    Comment: (NOTE) The GeneXpert MRSA Assay (FDA approved for NASAL specimens only), is one  component of a comprehensive MRSA colonization surveillance program. It is not intended to diagnose MRSA infection nor to guide or monitor treatment for MRSA infections. Test performance is not FDA approved in patients less than 18 years old. Performed at [facility], [place] [address] [place], [place] [address]   C Difficile Quick Screen w PCR reflex     Status: None   Collection Time: 05/17/21  9:59 AM   Specimen: STOOL  Result Value Ref Range Status   C Diff antigen NEGATIVE NEGATIVE Final   C Diff toxin NEGATIVE NEGATIVE Final   C Diff interpretation No C. difficile detected.  Final    Comment: Performed at [facility], [place] [address], [place], [place] [address]    Lab Basic Metabolic Panel: Recent Labs  Lab 05/16/21 2333 05/17/21 1021 05/17/21 2342  NA 139 137 132*  K 2.8* 2.7* 3.2*  CL 102 99  --   CO2 20* 18*  --   GLUCOSE 109* 112*  --   BUN 30* 36*  --   CREATININE 10.67* 10.34*  --   CALCIUM 6.7* 6.8*  --   MG  --  1.5*  --   PHOS  --  6.1*  --    Liver Function Tests: Recent Labs  Lab 05/16/21 2333 05/17/21 1021  AST 98*  --   ALT 41  --   ALKPHOS 147*  --   BILITOT 0.7  --   PROT 4.8*  --   ALBUMIN 2.0* 2.1*   Recent Labs  Lab 05/16/21 2333  LIPASE 31   No results for input(s): AMMONIA in the last 168 hours. CBC: Recent Labs  Lab 05/16/21 2333 05/17/21 1021 05/17/21 2342  WBC 19.1* 21.4*  --   NEUTROABS 16.8*  --   --   HGB 11.7* 11.6* 10.9*  HCT 34.0* 33.0* 32.0*  MCV 91.4 89.4  --   PLT 230 259  --    Cardiac Enzymes: No results for input(s): CKTOTAL, CKMB, CKMBINDEX, TROPONINI in the last 168 hours. Sepsis Labs: Recent Labs  Lab 05/16/21 2333 05/16/21 2335 05/17/21 0315 05/17/21 1021  WBC 19.1*  --   --  21.4*  LATICACIDVEN  --   3.3* 2.4*  --     Procedures/Operations  CPR, ETT 05/17/21   Andres Escandon Rodman Pickle 05-Jun-2021, 5:38 AM

## 2021-06-09 NOTE — Procedures (Signed)
Extubation Procedure Note  Patient Details:   Name: [name] DOB: July 04, 1945 MRN: [id number]   Airway Documentation:    Vent end date: 06-11-2021 Vent end time: 0129   Evaluation  O2 sats: currently acceptable Complications: No apparent complications Patient did tolerate procedure well. Bilateral Breath Sounds: Clear   Pt extubated at this time per family request for comfort care. Family back to bedside post extubation.   Irineo Axon [facility] June 11, 2021, 1:32 AM

## 2021-06-09 NOTE — Consult Note (Addendum)
NAME:  [name], MRN:  [id number], DOB:  04-12-1946, LOS: 0 ADMISSION DATE:  05/16/2021, CONSULTATION DATE:  06/14/2021 REFERRING MD:  Jose Persia, MD, CHIEF COMPLAINT:  PEA arrest   History of Present Illness:  76 year old female with ESRD on PD who presented with abdominal pain and diarrhea x 3 days. In the ED she was hypotensive and received IVF resuscitation and broad spectrum antibiotics with improvement of SBP to 90s. Admitted to Hospitalist service for sepsis secondary to GI illness. Of note she has had recent admissions for hypotension in setting of N/V. Overnight on 1/9, she had worsening altered mental status and hypotension. She went into PEA arrest. CPR was performed for 13 minutes. Given epi x 2, atropine x 1 and dextrose  Transferred to ICU post-PEA arrest. On arrival she rapidly declined with worsening hypotension requiring significant vasopressor support. After discussion with family including two daughters, decision made to transition to comfort care.  Pertinent  Medical History  ESRD on PD, chronic hypotension on midodrine, GERD  Significant Hospital Events: Including procedures, antibiotic start and stop dates in addition to other pertinent events   05/17/21 Admitted 06/14/21 Worsening vasopressor requirement. After GOC discussion with family including two daughters, decision made to transition to comfort care.  Interim History / Subjective:  As above  Objective   Blood pressure (!) 87/53, pulse (!) 106, temperature (!) 93.4 F (34.1 C), temperature source Esophageal, resp. rate 17, height 5' (1.524 m), weight 36.7 kg, SpO2 100 %.    Vent Mode: PRVC FiO2 (%):  [30 %-100 %] 30 % Set Rate:  [22 bmp-35 bmp] 35 bmp Vt Set:  [360 mL] 360 mL PEEP:  [5 cmH20] 5 cmH20   Intake/Output Summary (Last 24 hours) at 06-14-2021 0052 Last data filed at 05/17/2021 2120 Gross per 24 hour  Intake 1650.79 ml  Output 2 ml  Net 1648.79 ml   Filed Weights   05/17/21 1208 05/17/21  1730 05/17/21 1910  Weight: 37.6 kg 39.3 kg 36.7 kg   Physical Exam: General: Critically ill-appearing, minimally responsive HENT: , AT, ETT in place Eyes: Pupils 66m, non-reactive Respiratory: Clear to auscultation bilaterally.  No crackles, wheezing or rales Cardiovascular: RRR, -M/R/G, no JVD GI: Hypoactive, firm, nontender Extremities:-Edema,-tenderness Neuro: Unresponsive, intermittently grimaces, no gag  ABG 6.9/31/396 K 2.7 CO2 18 Mg 1.5 Chronic anemia WBC 21  Resolved Hospital Problem list   N/A  Assessment & Plan:  PEA arrest Septic shock, unknown source. Suspect GI etiology Hypovolemic shock ?Possible cardiogenic shock Diarrhea Acute on chronic hypotension Acute metabolic encephalopathy Hypokalemia Hypomagnesemia Hypocalcemia Severe metabolic acidosis/Lactic acidosis ESRD on peritoneal dialysis COPD, not in exacerbation Chronic anemia Goals of care discussion  Critically ill female s/p cardiac arrest with rapidly worsening hypotension refractory to vasopressor support. I discussed goals of care with family including her two daughters, health care decision makers, regarding her critical condition. We discussed palliative and comfort care measures. Initially family wished to be aggressive however given the short interval of patient's decline, decision was made to transition to comfort care.  --No further escalation of care --Comfort care order set placed  Best Practice (right click and "Reselect all SmartList Selections" daily)   Lines: N/A Foley:  N/A Code Status:  DNR Last date of multidisciplinary goals of care discussion [12023-02-06 Both daughters at bedside. Comfort care  Labs   CBC: Recent Labs  Lab 05/16/21 2333 05/17/21 1021 05/17/21 2342  WBC 19.1* 21.4*  --   NEUTROABS 16.8*  --   --  HGB 11.7* 11.6* 10.9*  HCT 34.0* 33.0* 32.0*  MCV 91.4 89.4  --   PLT 230 259  --     Basic Metabolic Panel: Recent Labs  Lab 05/16/21 2333  05/17/21 1021 05/17/21 2342  NA 139 137 132*  K 2.8* 2.7* 3.2*  CL 102 99  --   CO2 20* 18*  --   GLUCOSE 109* 112*  --   BUN 30* 36*  --   CREATININE 10.67* 10.34*  --   CALCIUM 6.7* 6.8*  --   MG  --  1.5*  --   PHOS  --  6.1*  --    GFR: Estimated Creatinine Clearance: 2.6 mL/min (A) (by C-G formula based on SCr of 10.34 mg/dL (H)). Recent Labs  Lab 05/16/21 2333 05/16/21 2335 05/17/21 0315 05/17/21 1021  WBC 19.1*  --   --  21.4*  LATICACIDVEN  --  3.3* 2.4*  --     Liver Function Tests: Recent Labs  Lab 05/16/21 2333 05/17/21 1021  AST 98*  --   ALT 41  --   ALKPHOS 147*  --   BILITOT 0.7  --   PROT 4.8*  --   ALBUMIN 2.0* 2.1*   Recent Labs  Lab 05/16/21 2333  LIPASE 31   No results for input(s): AMMONIA in the last 168 hours.  ABG    Component Value Date/Time   PHART 6.942 (LL) 05/17/2021 2342   PCO2ART 31.8 (L) 05/17/2021 2342   PO2ART 396 (H) 05/17/2021 2342   HCO3 7.2 (L) 05/17/2021 2342   TCO2 8 (L) 05/17/2021 2342   ACIDBASEDEF 25.0 (H) 05/17/2021 2342   O2SAT 100.0 05/17/2021 2342     Coagulation Profile: No results for input(s): INR, PROTIME in the last 168 hours.  Cardiac Enzymes: No results for input(s): CKTOTAL, CKMB, CKMBINDEX, TROPONINI in the last 168 hours.  HbA1C: Hgb A1c MFr Bld  Date/Time Value Ref Range Status  02/02/2010 11:29 AM 5.8 4.6 - 6.5 % Final    Comment:    See lab report for associated comment(s)  08/21/2008 11:26 AM 6.1 4.6 - 6.5 % Final    Comment:    See lab report for associated comment(s)    CBG: Recent Labs  Lab 05/17/21 2252  GLUCAP 49*    Review of Systems:   Unable to obtain  Past Medical History:  She,  has a past medical history of Allergic rhinitis, Anxiety, CAP (community acquired pneumonia) (06/2016), Cervical disc disease, Chest pain at rest (06/18/2012), COPD (chronic obstructive pulmonary disease) ([place]) (05/23/2012), Diverticulosis, DJD (degenerative joint disease), Esophageal  diverticulum, Esophageal stricture, ESRD on peritoneal dialysis ([place]), GERD (gastroesophageal reflux disease), Hiatal hernia, History of blood transfusion (06/2016), Hyperlipidemia (05/23/2012), Hypertension, IBS (irritable bowel syndrome), Lung nodules, Osteopenia, Recurrent sinusitis, Sinus headache, and Traumatic arthritis.   Surgical History:   Past Surgical History:  Procedure Laterality Date   ANTERIOR CERVICAL DECOMP/DISCECTOMY FUSION  2005   BACK SURGERY     BIOPSY  04/27/2021   Procedure: BIOPSY;  Surgeon: Rush Landmark Telford Nab., MD;  Location: [facility] ENDOSCOPY;  Service: Gastroenterology;;   COLONOSCOPY  2001   Archie Endo 09/21/2010   CYSTECTOMY Bilateral    hx/notes 09/21/2010   ESOPHAGOGASTRODUODENOSCOPY N/A 04/27/2021   Procedure: ESOPHAGOGASTRODUODENOSCOPY (EGD);  Surgeon: Irving Copas., MD;  Location: [place];  Service: Gastroenterology;  Laterality: N/A;   FOREIGN BODY REMOVAL  04/27/2021   Procedure: FOREIGN BODY REMOVAL;  Surgeon: Rush Landmark Telford Nab., MD;  Location: [facility];  Service:  Gastroenterology;;   IR GENERIC HISTORICAL  07/20/2016   IR FLUORO GUIDE CV LINE RIGHT 07/20/2016 [name], MD [facility]INTER[facility]   IR GENERIC HISTORICAL  07/20/2016   IR US GUIDE VASC ACCESS RIGHT 07/20/2016 [name], MD [facility]   LUNG BIOPSY Left    left side, not cancerous, thoracotomy   SAVORY DILATION N/A 04/27/2021   Procedure: SAVORY DILATION;  Surgeon: Irving Copas., MD;  Location: [facility];  Service: Gastroenterology;  Laterality: N/A;   TONSILLECTOMY AND ADENOIDECTOMY     hx/notes 09/21/2010   VAGINAL HYSTERECTOMY     hx w/oophorectomy/notes 09/21/2010     Social History:   reports that she quit smoking about 36 years ago. Her smoking use included cigarettes. She has a 0.96 pack-year smoking history. She has never used smokeless tobacco. She reports current alcohol use of about 1.0 standard drink per week. She reports that she does not use drugs.    Family History:  Her family history includes Blindness in her sister; Breast cancer in her maternal aunt; Hypertension in her sister and sister; Throat cancer in her father; Thyroid disease in her daughter. There is no history of Colon cancer or Stomach cancer.   Allergies Allergies  Allergen Reactions   Ace Inhibitors Anaphylaxis, Swelling and Other (See Comments)    Angioedema   Calcium Channel Blockers Anaphylaxis, Itching, Rash and Other (See Comments)    Red rash, Knot (sore and itches), and a fever for a couple days (per pt report)   Pneumococcal Vaccines Other (See Comments)    Red rash, Knot (sore and itches), A fever for a couple days (per pt report)   Tetanus Toxoid Swelling and Other (See Comments)    FEVER and cellulitis in an arm     Latex Rash    Reaction to gloves   Tape Rash and Other (See Comments)    PLEASE USE PAPER TAPE!!     Home Medications  Prior to Admission medications   Medication Sig Start Date End Date Taking? Authorizing Provider  acetaminophen (TYLENOL) 500 MG tablet Take 1,000 mg by mouth every 6 (six) hours as needed (for headaches or pain).    Yes [provider]  albuterol (VENTOLIN HFA) 108 (90 Base) MCG/ACT inhaler Inhale 1-2 puffs into the lungs every 6 (six) hours as needed for wheezing or shortness of breath. 12/16/19  Yes Parrett, Tammy S, NP  aspirin EC 81 MG tablet Take 81 mg by mouth daily.   Yes [provider]  atorvastatin (LIPITOR) 20 MG tablet Take 20 mg by mouth daily. 02/24/21  Yes [provider]  BREO ELLIPTA 100-25 MCG/INH AEPB Inhale 1 puff into the lungs daily as needed (shortness of breath). 06/30/19  Yes [provider]  calcitRIOL (ROCALTROL) 0.25 MCG capsule Take 0.25 mcg by mouth daily.  03/14/19  Yes [provider]  COLACE 100 MG capsule Take 100 mg by mouth daily as needed for mild constipation.  03/14/19  Yes [provider]  cyclobenzaprine (FLEXERIL) 5 MG tablet Take  5 mg by mouth 3 (three) times daily as needed for muscle spasms. 01/15/21  Yes [provider]  ergocalciferol (VITAMIN D2) 1.25 MG (50000 UT) capsule Take 50,000 Units by mouth every Sunday.   Yes [provider]  ferric citrate (AURYXIA) 1 GM 210 MG(Fe) tablet Take 210 mg by mouth 3 (three) times daily with meals.   Yes [provider]  fludrocortisone (FLORINEF) 0.1 MG tablet Take 1 tablet (0.1 mg  total) by mouth daily. 04/30/21  Yes Ghimire, Henreitta Leber, MD  fluticasone (FLONASE) 50 MCG/ACT nasal spray Place 1 spray into both nostrils daily as needed for allergies or rhinitis. 01/15/21  Yes [provider]  loratadine-pseudoephedrine (CLARITIN-D 12 HOUR) 5-120 MG tablet Take 1 tablet by mouth 2 (two) times daily as needed for allergies.   Yes [provider]  Melatonin 5 MG TABS Take 5 mg by mouth at bedtime as needed (for sleep).    Yes [provider]  midodrine (PROAMATINE) 10 MG tablet Take 1.5 tablets (15 mg total) by mouth 3 (three) times daily with meals. 04/29/21  Yes Ghimire, Henreitta Leber, MD  multivitamin (RENA-VIT) TABS tablet Take 1 tablet by mouth daily.   Yes [provider]  ondansetron (ZOFRAN-ODT) 4 MG disintegrating tablet Take 4 mg by mouth every 6 (six) hours as needed for nausea. 04/20/21  Yes [provider]  pantoprazole (PROTONIX) 40 MG tablet Take 1 tablet (40 mg total) by mouth 2 (two) times daily. 04/29/21  Yes Ghimire, Henreitta Leber, MD  potassium chloride 20 MEQ/15ML (10%) SOLN Take 30 mLs by mouth daily. 05/07/21  Yes [provider]  sodium chloride (OCEAN) 0.65 % SOLN nasal spray Place 2 sprays into both nostrils daily as needed for congestion.    Yes [provider]  Sodium Chloride-Sodium Bicarb (NETI POT SINUS [place]) 2300-700 MG KIT Place 1 application into the nose 2 (two) times daily as needed. Patient taking differently: Place 1 application into the nose 2 (two) times daily as needed  (congestion). 04/03/21  Yes Isla Pence, MD     Critical care time: 50 min    The patient is critically ill with multiple organ systems failure and requires high complexity decision making for assessment and support, frequent evaluation and titration of therapies, application of advanced monitoring technologies and extensive interpretation of multiple databases.    Rodman Pickle, M.D. [facility] Pulmonary/Critical Care Medicine 2021/05/30 12:53 AM   Please see Amion for pager number to reach on-call Pulmonary and Critical Care Team.

## 2021-06-09 NOTE — Progress Notes (Signed)
eLink Physician-Brief Progress Note Patient Name: MASSIE COGLIANO DOB: 1946/01/17 MRN: [id number]   Date of Service  24-May-2021  HPI/Events of Note  76 yo BF originally admitted for treatment of  suspected bacterial peritonitis related to in dwelling PD catheter, who developed cardiac arrest on the floor and was transferred to the ICU for further management following ROSC and intubation.  eICU Interventions  New patient Evaluation. Orders entered.        Kerry Kass Earnesteen Birnie 05/24/21, 12:11 AM

## 2021-06-09 NOTE — Progress Notes (Signed)
Chaplain provided prayer with family around [place].  Chaplain responded to second code and family was able to make meaningful decisions concerning [name]'s care.    Chaplain provided presence and support.    06/12/2021 0100  Clinical Encounter Type  Visited With Patient and family together  Visit Type Spiritual support  Spiritual Encounters  Spiritual Needs Prayer

## 2021-06-09 DEATH — deceased
